# Patient Record
Sex: Male | Born: 1935
Health system: Southern US, Community
[De-identification: ages and names within clinical notes are randomized; demographics above are authoritative.]

## PROBLEM LIST (undated history)

## (undated) DIAGNOSIS — I2510 Atherosclerotic heart disease of native coronary artery without angina pectoris: Secondary | ICD-10-CM

## (undated) DIAGNOSIS — M1990 Unspecified osteoarthritis, unspecified site: Secondary | ICD-10-CM

## (undated) DIAGNOSIS — G20 Parkinson's disease: Secondary | ICD-10-CM

## (undated) DIAGNOSIS — I4891 Unspecified atrial fibrillation: Secondary | ICD-10-CM

## (undated) DIAGNOSIS — R251 Tremor, unspecified: Secondary | ICD-10-CM

## (undated) DIAGNOSIS — I052 Rheumatic mitral stenosis with insufficiency: Secondary | ICD-10-CM

## (undated) DIAGNOSIS — C61 Malignant neoplasm of prostate: Secondary | ICD-10-CM

## (undated) DIAGNOSIS — I10 Essential (primary) hypertension: Secondary | ICD-10-CM

## (undated) DIAGNOSIS — I350 Nonrheumatic aortic (valve) stenosis: Secondary | ICD-10-CM

## (undated) DIAGNOSIS — Z87442 Personal history of urinary calculi: Secondary | ICD-10-CM

## (undated) DIAGNOSIS — G20A1 Parkinson's disease without dyskinesia, without mention of fluctuations: Secondary | ICD-10-CM

## (undated) DIAGNOSIS — C4360 Malignant melanoma of unspecified upper limb, including shoulder: Secondary | ICD-10-CM

## (undated) DIAGNOSIS — K922 Gastrointestinal hemorrhage, unspecified: Secondary | ICD-10-CM

## (undated) DIAGNOSIS — E7800 Pure hypercholesterolemia, unspecified: Secondary | ICD-10-CM

## (undated) HISTORY — PX: CATARACT EXTRACTION W/ INTRAOCULAR LENS  IMPLANT, BILATERAL: SHX1307

## (undated) HISTORY — PX: MELANOMA EXCISION: SHX5266

## (undated) HISTORY — PX: CARDIAC CATHETERIZATION: SHX172

---

## 1989-02-28 HISTORY — PX: INGUINAL HERNIA REPAIR: SUR1180

## 1991-03-01 HISTORY — PX: LAPAROSCOPIC CHOLECYSTECTOMY: SUR755

## 2006-03-01 ENCOUNTER — Emergency Department (HOSPITAL_COMMUNITY): Admission: EM | Admit: 2006-03-01 | Discharge: 2006-03-01 | Payer: Self-pay | Admitting: Family Medicine

## 2009-02-28 DIAGNOSIS — C61 Malignant neoplasm of prostate: Secondary | ICD-10-CM

## 2009-02-28 HISTORY — DX: Malignant neoplasm of prostate: C61

## 2009-03-11 ENCOUNTER — Ambulatory Visit
Admission: RE | Admit: 2009-03-11 | Discharge: 2009-06-09 | Payer: Self-pay | Source: Home / Self Care | Admitting: Radiation Oncology

## 2009-05-25 ENCOUNTER — Encounter: Admission: RE | Admit: 2009-05-25 | Discharge: 2009-05-25 | Payer: Self-pay | Admitting: Urology

## 2009-06-25 ENCOUNTER — Ambulatory Visit (HOSPITAL_BASED_OUTPATIENT_CLINIC_OR_DEPARTMENT_OTHER): Admission: RE | Admit: 2009-06-25 | Discharge: 2009-06-25 | Payer: Self-pay | Admitting: Urology

## 2009-06-25 HISTORY — PX: INSERTION PROSTATE RADIATION SEED: SUR718

## 2009-07-14 ENCOUNTER — Ambulatory Visit: Admission: RE | Admit: 2009-07-14 | Discharge: 2009-08-12 | Payer: Self-pay | Admitting: Radiation Oncology

## 2009-08-05 ENCOUNTER — Encounter (INDEPENDENT_AMBULATORY_CARE_PROVIDER_SITE_OTHER): Payer: Self-pay | Admitting: Urology

## 2009-08-05 ENCOUNTER — Ambulatory Visit: Payer: Self-pay | Admitting: Vascular Surgery

## 2009-08-05 ENCOUNTER — Ambulatory Visit (HOSPITAL_COMMUNITY): Admission: RE | Admit: 2009-08-05 | Discharge: 2009-08-05 | Payer: Self-pay | Admitting: Urology

## 2010-05-18 LAB — COMPREHENSIVE METABOLIC PANEL
ALT: 23 U/L (ref 0–53)
AST: 28 U/L (ref 0–37)
Albumin: 4.3 g/dL (ref 3.5–5.2)
Alkaline Phosphatase: 77 U/L (ref 39–117)
BUN: 16 mg/dL (ref 6–23)
CO2: 29 mEq/L (ref 19–32)
Calcium: 9.7 mg/dL (ref 8.4–10.5)
Chloride: 103 mEq/L (ref 96–112)
Creatinine, Ser: 1.15 mg/dL (ref 0.4–1.5)
GFR calc Af Amer: >60 mL/min (ref >60)
GFR calc non Af Amer: >60 mL/min (ref >60)
Glucose, Bld: 101 mg/dL — ABNORMAL HIGH (ref 70–99)
Potassium: 4.2 mEq/L (ref 3.5–5.1)
Sodium: 140 mEq/L (ref 135–145)
Total Bilirubin: 3 mg/dL — ABNORMAL HIGH (ref 0.3–1.2)
Total Protein: 6.7 g/dL (ref 6.0–8.3)

## 2010-05-18 LAB — CBC
HCT: 41.2 % (ref 39.0–52.0)
Hemoglobin: 13.9 g/dL (ref 13.0–17.0)
MCHC: 33.7 g/dL (ref 30.0–36.0)
MCV: 91.1 fL (ref 78.0–100.0)
Platelets: 176 10*3/uL (ref 150–400)
RBC: 4.52 MIL/uL (ref 4.22–5.81)
RDW: 13.2 % (ref 11.5–15.5)
WBC: 5.5 10*3/uL (ref 4.0–10.5)

## 2010-05-18 LAB — PROTIME-INR
INR: 1.01 (ref 0.00–1.49)
Prothrombin Time: 13.2 seconds (ref 11.6–15.2)

## 2010-05-18 LAB — APTT: aPTT: 34 seconds (ref 24–37)

## 2015-03-05 DIAGNOSIS — E785 Hyperlipidemia, unspecified: Secondary | ICD-10-CM | POA: Diagnosis not present

## 2015-03-05 DIAGNOSIS — I10 Essential (primary) hypertension: Secondary | ICD-10-CM | POA: Diagnosis not present

## 2015-03-05 DIAGNOSIS — Z79899 Other long term (current) drug therapy: Secondary | ICD-10-CM | POA: Diagnosis not present

## 2015-03-23 DIAGNOSIS — L570 Actinic keratosis: Secondary | ICD-10-CM | POA: Diagnosis not present

## 2015-03-23 DIAGNOSIS — Z85820 Personal history of malignant melanoma of skin: Secondary | ICD-10-CM | POA: Diagnosis not present

## 2015-03-23 DIAGNOSIS — L821 Other seborrheic keratosis: Secondary | ICD-10-CM | POA: Diagnosis not present

## 2015-03-23 DIAGNOSIS — L853 Xerosis cutis: Secondary | ICD-10-CM | POA: Diagnosis not present

## 2015-03-23 DIAGNOSIS — L812 Freckles: Secondary | ICD-10-CM | POA: Diagnosis not present

## 2015-03-23 DIAGNOSIS — D485 Neoplasm of uncertain behavior of skin: Secondary | ICD-10-CM | POA: Diagnosis not present

## 2015-03-23 DIAGNOSIS — C44719 Basal cell carcinoma of skin of left lower limb, including hip: Secondary | ICD-10-CM | POA: Diagnosis not present

## 2015-04-29 DIAGNOSIS — M2242 Chondromalacia patellae, left knee: Secondary | ICD-10-CM | POA: Diagnosis not present

## 2015-04-29 DIAGNOSIS — M25561 Pain in right knee: Secondary | ICD-10-CM | POA: Diagnosis not present

## 2015-04-29 DIAGNOSIS — M7121 Synovial cyst of popliteal space [Baker], right knee: Secondary | ICD-10-CM | POA: Diagnosis not present

## 2015-04-29 DIAGNOSIS — M25461 Effusion, right knee: Secondary | ICD-10-CM | POA: Diagnosis not present

## 2015-04-29 DIAGNOSIS — M25562 Pain in left knee: Secondary | ICD-10-CM | POA: Diagnosis not present

## 2015-04-29 DIAGNOSIS — M2241 Chondromalacia patellae, right knee: Secondary | ICD-10-CM | POA: Diagnosis not present

## 2015-05-13 DIAGNOSIS — M7122 Synovial cyst of popliteal space [Baker], left knee: Secondary | ICD-10-CM | POA: Diagnosis not present

## 2015-05-13 DIAGNOSIS — M1712 Unilateral primary osteoarthritis, left knee: Secondary | ICD-10-CM | POA: Diagnosis not present

## 2015-05-13 DIAGNOSIS — M170 Bilateral primary osteoarthritis of knee: Secondary | ICD-10-CM | POA: Diagnosis not present

## 2015-05-13 DIAGNOSIS — M2242 Chondromalacia patellae, left knee: Secondary | ICD-10-CM | POA: Diagnosis not present

## 2015-05-13 DIAGNOSIS — M25562 Pain in left knee: Secondary | ICD-10-CM | POA: Diagnosis not present

## 2015-05-13 DIAGNOSIS — M25561 Pain in right knee: Secondary | ICD-10-CM | POA: Diagnosis not present

## 2015-06-10 DIAGNOSIS — M170 Bilateral primary osteoarthritis of knee: Secondary | ICD-10-CM | POA: Diagnosis not present

## 2015-06-10 DIAGNOSIS — M2242 Chondromalacia patellae, left knee: Secondary | ICD-10-CM | POA: Diagnosis not present

## 2015-06-10 DIAGNOSIS — M2241 Chondromalacia patellae, right knee: Secondary | ICD-10-CM | POA: Diagnosis not present

## 2015-06-10 DIAGNOSIS — M25561 Pain in right knee: Secondary | ICD-10-CM | POA: Diagnosis not present

## 2015-06-10 DIAGNOSIS — M25562 Pain in left knee: Secondary | ICD-10-CM | POA: Diagnosis not present

## 2015-06-22 DIAGNOSIS — M1712 Unilateral primary osteoarthritis, left knee: Secondary | ICD-10-CM | POA: Diagnosis not present

## 2015-06-22 DIAGNOSIS — M7121 Synovial cyst of popliteal space [Baker], right knee: Secondary | ICD-10-CM | POA: Diagnosis not present

## 2015-06-22 DIAGNOSIS — M170 Bilateral primary osteoarthritis of knee: Secondary | ICD-10-CM | POA: Diagnosis not present

## 2015-06-22 DIAGNOSIS — M2241 Chondromalacia patellae, right knee: Secondary | ICD-10-CM | POA: Diagnosis not present

## 2015-06-22 DIAGNOSIS — M2242 Chondromalacia patellae, left knee: Secondary | ICD-10-CM | POA: Diagnosis not present

## 2015-06-22 DIAGNOSIS — M1711 Unilateral primary osteoarthritis, right knee: Secondary | ICD-10-CM | POA: Diagnosis not present

## 2015-06-29 DIAGNOSIS — M7122 Synovial cyst of popliteal space [Baker], left knee: Secondary | ICD-10-CM | POA: Diagnosis not present

## 2015-06-29 DIAGNOSIS — M170 Bilateral primary osteoarthritis of knee: Secondary | ICD-10-CM | POA: Diagnosis not present

## 2015-06-29 DIAGNOSIS — M2241 Chondromalacia patellae, right knee: Secondary | ICD-10-CM | POA: Diagnosis not present

## 2015-06-29 DIAGNOSIS — M2242 Chondromalacia patellae, left knee: Secondary | ICD-10-CM | POA: Diagnosis not present

## 2015-07-06 DIAGNOSIS — M1711 Unilateral primary osteoarthritis, right knee: Secondary | ICD-10-CM | POA: Diagnosis not present

## 2015-07-06 DIAGNOSIS — M170 Bilateral primary osteoarthritis of knee: Secondary | ICD-10-CM | POA: Diagnosis not present

## 2015-07-06 DIAGNOSIS — M1712 Unilateral primary osteoarthritis, left knee: Secondary | ICD-10-CM | POA: Diagnosis not present

## 2015-08-17 DIAGNOSIS — M1712 Unilateral primary osteoarthritis, left knee: Secondary | ICD-10-CM | POA: Diagnosis not present

## 2015-08-17 DIAGNOSIS — M1711 Unilateral primary osteoarthritis, right knee: Secondary | ICD-10-CM | POA: Diagnosis not present

## 2015-08-17 DIAGNOSIS — M25561 Pain in right knee: Secondary | ICD-10-CM | POA: Diagnosis not present

## 2015-08-17 DIAGNOSIS — M25461 Effusion, right knee: Secondary | ICD-10-CM | POA: Diagnosis not present

## 2015-08-26 DIAGNOSIS — M7122 Synovial cyst of popliteal space [Baker], left knee: Secondary | ICD-10-CM | POA: Diagnosis not present

## 2015-08-26 DIAGNOSIS — M1711 Unilateral primary osteoarthritis, right knee: Secondary | ICD-10-CM | POA: Diagnosis not present

## 2015-09-03 DIAGNOSIS — M25461 Effusion, right knee: Secondary | ICD-10-CM | POA: Diagnosis not present

## 2015-09-03 DIAGNOSIS — M1711 Unilateral primary osteoarthritis, right knee: Secondary | ICD-10-CM | POA: Diagnosis not present

## 2015-09-03 DIAGNOSIS — M2241 Chondromalacia patellae, right knee: Secondary | ICD-10-CM | POA: Diagnosis not present

## 2015-09-03 DIAGNOSIS — M25561 Pain in right knee: Secondary | ICD-10-CM | POA: Diagnosis not present

## 2015-09-15 DIAGNOSIS — Z79899 Other long term (current) drug therapy: Secondary | ICD-10-CM | POA: Diagnosis not present

## 2015-09-15 DIAGNOSIS — Z0000 Encounter for general adult medical examination without abnormal findings: Secondary | ICD-10-CM | POA: Diagnosis not present

## 2015-09-15 DIAGNOSIS — E785 Hyperlipidemia, unspecified: Secondary | ICD-10-CM | POA: Diagnosis not present

## 2015-09-15 DIAGNOSIS — Z125 Encounter for screening for malignant neoplasm of prostate: Secondary | ICD-10-CM | POA: Diagnosis not present

## 2015-09-15 DIAGNOSIS — N189 Chronic kidney disease, unspecified: Secondary | ICD-10-CM | POA: Diagnosis not present

## 2015-09-15 DIAGNOSIS — I10 Essential (primary) hypertension: Secondary | ICD-10-CM | POA: Diagnosis not present

## 2015-09-24 DIAGNOSIS — D485 Neoplasm of uncertain behavior of skin: Secondary | ICD-10-CM | POA: Diagnosis not present

## 2015-09-24 DIAGNOSIS — L812 Freckles: Secondary | ICD-10-CM | POA: Diagnosis not present

## 2015-09-24 DIAGNOSIS — L570 Actinic keratosis: Secondary | ICD-10-CM | POA: Diagnosis not present

## 2015-09-24 DIAGNOSIS — D0471 Carcinoma in situ of skin of right lower limb, including hip: Secondary | ICD-10-CM | POA: Diagnosis not present

## 2015-09-24 DIAGNOSIS — L281 Prurigo nodularis: Secondary | ICD-10-CM | POA: Diagnosis not present

## 2015-09-24 DIAGNOSIS — Z85828 Personal history of other malignant neoplasm of skin: Secondary | ICD-10-CM | POA: Diagnosis not present

## 2015-09-24 DIAGNOSIS — L821 Other seborrheic keratosis: Secondary | ICD-10-CM | POA: Diagnosis not present

## 2015-09-24 DIAGNOSIS — Z85820 Personal history of malignant melanoma of skin: Secondary | ICD-10-CM | POA: Diagnosis not present

## 2015-12-15 DIAGNOSIS — H2521 Age-related cataract, morgagnian type, right eye: Secondary | ICD-10-CM | POA: Diagnosis not present

## 2015-12-15 DIAGNOSIS — H5202 Hypermetropia, left eye: Secondary | ICD-10-CM | POA: Diagnosis not present

## 2015-12-15 DIAGNOSIS — H35362 Drusen (degenerative) of macula, left eye: Secondary | ICD-10-CM | POA: Diagnosis not present

## 2015-12-15 DIAGNOSIS — Z961 Presence of intraocular lens: Secondary | ICD-10-CM | POA: Diagnosis not present

## 2015-12-16 DIAGNOSIS — C61 Malignant neoplasm of prostate: Secondary | ICD-10-CM | POA: Diagnosis not present

## 2015-12-16 DIAGNOSIS — R972 Elevated prostate specific antigen [PSA]: Secondary | ICD-10-CM | POA: Diagnosis not present

## 2015-12-16 DIAGNOSIS — N3040 Irradiation cystitis without hematuria: Secondary | ICD-10-CM | POA: Diagnosis not present

## 2015-12-21 DIAGNOSIS — I10 Essential (primary) hypertension: Secondary | ICD-10-CM | POA: Diagnosis not present

## 2015-12-25 DIAGNOSIS — Z23 Encounter for immunization: Secondary | ICD-10-CM | POA: Diagnosis not present

## 2016-02-10 DIAGNOSIS — H353131 Nonexudative age-related macular degeneration, bilateral, early dry stage: Secondary | ICD-10-CM | POA: Diagnosis not present

## 2016-02-10 DIAGNOSIS — H2511 Age-related nuclear cataract, right eye: Secondary | ICD-10-CM | POA: Diagnosis not present

## 2016-03-08 DIAGNOSIS — H2511 Age-related nuclear cataract, right eye: Secondary | ICD-10-CM | POA: Diagnosis not present

## 2016-03-25 DIAGNOSIS — D485 Neoplasm of uncertain behavior of skin: Secondary | ICD-10-CM | POA: Diagnosis not present

## 2016-03-25 DIAGNOSIS — L812 Freckles: Secondary | ICD-10-CM | POA: Diagnosis not present

## 2016-03-25 DIAGNOSIS — Z85820 Personal history of malignant melanoma of skin: Secondary | ICD-10-CM | POA: Diagnosis not present

## 2016-03-25 DIAGNOSIS — Z85828 Personal history of other malignant neoplasm of skin: Secondary | ICD-10-CM | POA: Diagnosis not present

## 2016-03-25 DIAGNOSIS — L570 Actinic keratosis: Secondary | ICD-10-CM | POA: Diagnosis not present

## 2016-03-25 DIAGNOSIS — L821 Other seborrheic keratosis: Secondary | ICD-10-CM | POA: Diagnosis not present

## 2016-03-25 DIAGNOSIS — L218 Other seborrheic dermatitis: Secondary | ICD-10-CM | POA: Diagnosis not present

## 2016-04-20 DIAGNOSIS — H26492 Other secondary cataract, left eye: Secondary | ICD-10-CM | POA: Diagnosis not present

## 2016-04-20 DIAGNOSIS — Z9842 Cataract extraction status, left eye: Secondary | ICD-10-CM | POA: Diagnosis not present

## 2016-04-20 DIAGNOSIS — Z961 Presence of intraocular lens: Secondary | ICD-10-CM | POA: Diagnosis not present

## 2016-04-25 DIAGNOSIS — I10 Essential (primary) hypertension: Secondary | ICD-10-CM | POA: Diagnosis not present

## 2016-05-05 DIAGNOSIS — R0789 Other chest pain: Secondary | ICD-10-CM | POA: Diagnosis not present

## 2016-05-05 DIAGNOSIS — R0602 Shortness of breath: Secondary | ICD-10-CM | POA: Diagnosis not present

## 2016-05-05 DIAGNOSIS — J159 Unspecified bacterial pneumonia: Secondary | ICD-10-CM | POA: Diagnosis not present

## 2016-05-14 DIAGNOSIS — J159 Unspecified bacterial pneumonia: Secondary | ICD-10-CM | POA: Diagnosis not present

## 2016-05-14 DIAGNOSIS — R0602 Shortness of breath: Secondary | ICD-10-CM | POA: Diagnosis not present

## 2016-05-20 DIAGNOSIS — J159 Unspecified bacterial pneumonia: Secondary | ICD-10-CM | POA: Diagnosis not present

## 2016-05-29 HISTORY — PX: EYE SURGERY: SHX253

## 2016-06-10 DIAGNOSIS — R05 Cough: Secondary | ICD-10-CM | POA: Diagnosis not present

## 2016-06-10 DIAGNOSIS — J64 Unspecified pneumoconiosis: Secondary | ICD-10-CM | POA: Diagnosis not present

## 2016-07-01 DIAGNOSIS — C61 Malignant neoplasm of prostate: Secondary | ICD-10-CM | POA: Diagnosis not present

## 2016-07-01 DIAGNOSIS — R310 Gross hematuria: Secondary | ICD-10-CM | POA: Diagnosis not present

## 2016-07-08 DIAGNOSIS — R3129 Other microscopic hematuria: Secondary | ICD-10-CM | POA: Diagnosis not present

## 2016-07-08 DIAGNOSIS — R310 Gross hematuria: Secondary | ICD-10-CM | POA: Diagnosis not present

## 2016-07-18 DIAGNOSIS — Z961 Presence of intraocular lens: Secondary | ICD-10-CM | POA: Diagnosis not present

## 2016-07-18 DIAGNOSIS — H26491 Other secondary cataract, right eye: Secondary | ICD-10-CM | POA: Diagnosis not present

## 2016-07-22 DIAGNOSIS — C61 Malignant neoplasm of prostate: Secondary | ICD-10-CM | POA: Diagnosis not present

## 2016-07-22 DIAGNOSIS — R310 Gross hematuria: Secondary | ICD-10-CM | POA: Diagnosis not present

## 2016-07-26 DIAGNOSIS — H353121 Nonexudative age-related macular degeneration, left eye, early dry stage: Secondary | ICD-10-CM | POA: Diagnosis not present

## 2016-07-30 DIAGNOSIS — J22 Unspecified acute lower respiratory infection: Secondary | ICD-10-CM | POA: Diagnosis not present

## 2016-07-30 DIAGNOSIS — R062 Wheezing: Secondary | ICD-10-CM | POA: Diagnosis not present

## 2016-08-04 DIAGNOSIS — G20 Parkinson's disease: Secondary | ICD-10-CM | POA: Diagnosis not present

## 2016-08-04 DIAGNOSIS — E7800 Pure hypercholesterolemia, unspecified: Secondary | ICD-10-CM | POA: Diagnosis not present

## 2016-08-04 DIAGNOSIS — I2510 Atherosclerotic heart disease of native coronary artery without angina pectoris: Secondary | ICD-10-CM | POA: Diagnosis not present

## 2016-08-04 DIAGNOSIS — R0609 Other forms of dyspnea: Secondary | ICD-10-CM | POA: Diagnosis not present

## 2016-08-05 DIAGNOSIS — I2510 Atherosclerotic heart disease of native coronary artery without angina pectoris: Secondary | ICD-10-CM | POA: Diagnosis not present

## 2016-08-07 DIAGNOSIS — I2510 Atherosclerotic heart disease of native coronary artery without angina pectoris: Secondary | ICD-10-CM | POA: Diagnosis present

## 2016-08-07 DIAGNOSIS — R0600 Dyspnea, unspecified: Secondary | ICD-10-CM | POA: Diagnosis present

## 2016-08-07 DIAGNOSIS — R0609 Other forms of dyspnea: Secondary | ICD-10-CM

## 2016-08-07 NOTE — H&P (Signed)
OFFICE VISIT NOTES COPIED TO EPIC FOR DOCUMENTATION  . History of Present Illness [name] Page MD; 08/05/2016 5:58 PM) Patient words: Last OV 12/21/2015; FU for CT results.  The patient is a 81 year old male who presents for shortness of breath.  Additional reasons for visit:  Follow-up for Aortic stenosis is described as the following: He has h/o of mild aortic stenosisremote h/o A. Fib without appropriate documentation. He was being followed by cardiologist in [place] and he has not established with me. The last echocardiogram was in 2012 revealing moderate LVH, mild mitral and aortic regurgitation and moderate aortic sclerosis with aortic root being in the upper limits of normal.  Patient has history of prostate cancer status post radiation seed implantation in 2011. He is now being followed by Dr. Carolan Clines. During evaluation of his prostate cancer, CT scan of the abdomen had revealed significant atherosclerotic burden of the aorta and also three-vessel coronary artery calcification including left main, severe calcification of the aortic valve. Hence he was re-referred back to me for evaluation of an urgent basis as patient had developed severe symptoms or dyspnea or the past one month. He has been treated for "pneumonia" with antibiotics and with no improvement in symptoms. He has had some cough but states that mostly during minimal chores, including walking within the house he gets so fatigued and tired and dyspneic that he has to sit down. Both patient and his wife have been extremely concerned about his new onset of symptoms and wanted to be seen on a urgent basis. No leg edema, no PND or orthopnea. No chest pain.   Problem List/Past Medical (April Harrington; 08/20/2016 3:06 PM) History of prostate cancer (Z85.46) [05/2009]: OV Note-Dr Carolan Clines 07/22/2016 History of prostate cancer status post seed implantation in April 2011, now has radiation cystitis and  prostatitis, mild. Microscopic eventually a mild, no further treatment surgery. Severe aortic calcification by CT chest. Venous insufficiency (I87.2)  History of melanoma (Z85.820) [2010]: Left Shoulder Essential hypertension, benign (I10)  Echocardiogram 62/83/6629 Normal LV systolic function, EF 47-65%. Moderate asymmetric LVH. Severe left atrial enlargement. Mild mitral and aortic regurgitation. Moderate aortic sclerosis. Aortic root upper limits of normal. Hypercholesteremia (E78.00)  CT of the abdomen and pelvis 07/08/2016: Elevated right hemidiaphragm. Atherosclerotic calcification in all 3 coronary vessels including left main, severe calcification of the aortic valve. Aortic atherosclerosis is evident.  Allergies (April Harrington; August 20, 2016 3:06 PM) No Known Drug Allergies [10/27/2014]:  Family History (April Harrington; 08/20/2016 3:06 PM) Mother  Deceased. at age 29; Pt had several Strokes, MI's in her early 62's Father  Deceased. at age 58 from an MI (was his 61st) Sister 1  Deceased. from a Cerebral Hemorrhage; Older Brother 1  4 Yrs Younger; Unknown History  Social History (April Harrington; 08-20-2016 3:06 PM) Marital status  Divorced. Living Situation  Lives with Partner Current tobacco use  Never smoker. Alcohol Use  Moderate alcohol use. 2 drinks/day Number of Children  1.  Past Surgical History (April Harrington; 20-Aug-2016 3:36 PM) Hernia Repair [1991]: Cholecystectomy [1993]: Prostate Surgery [06/25/2009]: Cataract Extraction-Left [2014]: Cataract right 02/2016, Laser treatment 05/2016  Medication History (April Harrington; 08-20-16 3:40 PM) PreserVision AREDS 2 (1 Oral two times daily) Active. Vitamin E (400UNIT Tablet, 1 Oral Daily) Active. Vitamin D3 (1000UNIT Tablet, 1 Oral daily) Active. Tamsulosin HCl (0.4MG Capsule, 1 Oral at bedtime) Active. Crestor (10MG Tablet, 1 Oral at bedtime) Active. (PT CANNOT TAKE GENERIC!) Aspirin (81MG Tablet, 1  Oral daily) Active. Losartan  Potassium ('50MG'$  Tablet, 1 Oral daily) Active. Triamterene-HCTZ (37.5-'25MG'$  Tablet, 1 Oral Daily) Active. Propranolol HCl ('40MG'$  Tablet, 1 Oral two times daily) Active. Medications Reconciled (verbally with patient/ list present)  Diagnostic Studies History Anderson Malta Sergeant; 08/04/2016 9:16 AM) CT Scan of Abdomen [07/08/2016]: Elevated right hemidiaphragm. Atherosclerotic calcification in all 3 coronary vessels including left main, severe calcification of the aortic valve. Aortic atherosclerosis is evident.    Review of Systems [name] Page MD; 08/05/2016 5:59 PM) General Present- Feeling well. Not Present- Fatigue, Fever and Night Sweats. Skin Not Present- Itching and Rash. HEENT Not Present- Headache. Respiratory Present- Cough and Difficulty Breathing on Exertion. Not Present- Bloody sputum, Wakes up from Sleep Wheezing or Short of Breath and Wheezing. Cardiovascular Not Present- Claudications, Edema, Fainting and Orthopnea. Gastrointestinal Not Present- Abdominal Pain, Constipation, Diarrhea, Nausea and Vomiting. Musculoskeletal Not Present- Joint Swelling. Neurological Present- Tremor. Not Present- Headaches. Hematology Not Present- Blood Clots, Easy Bruising and Nose Bleed.  Vitals (April Harrington; 08/04/2016 3:53 PM) 08/04/2016 3:30 PM Weight: 205.31 lb Height: 71in Body Surface Area: 2.13 m Body Mass Index: 28.63 kg/m  Pulse: 67 (Regular)  P.OX: 98% (Room air) BP: 172/82 (Sitting, Left Arm, Standard)       Physical Exam [name] Page MD; 08/05/2016 6:02 PM) General Mental Status-Alert. General Appearance-Cooperative, Appears stated age, Not in acute distress. Orientation-Oriented X3. Build & Nutrition-Well built.  Head and Neck Thyroid Gland Characteristics - no palpable nodules, no palpable enlargement.  Chest and Lung Exam Palpation Tender - No chest wall tenderness. Auscultation Breath sounds -  Clear.  Cardiovascular Inspection Jugular vein - Right - No Distention. Auscultation Heart Sounds - S1 WNL, S2 WNL and No gallop present. Murmurs & Other Heart Sounds - Murmur - No murmur.  Abdomen Palpation/Percussion Normal exam - Non Tender and No hepatosplenomegaly. Auscultation Normal exam - Bowel sounds normal.  Peripheral Vascular Lower Extremity Inspection - Left - Varicose veins, No Pigmentation. Right - No Pigmentation, No Varicose veins. Palpation - Edema - Left - No edema. Right - No edema. Femoral pulse - Left - Normal. Right - Normal. Popliteal pulse - Left - Normal. Right - Normal. Dorsalis pedis pulse - Left - Normal. Right - Normal. Posterior tibial pulse - Left - Normal. Right - Normal. Carotid arteries - Left-No Carotid bruit. Carotid arteries - Right-No Carotid bruit. Abdomen-No prominent abdominal aortic pulsation, No epigastric bruit.  Neurologic Motor Involuntary Movements - Resting tremor - Left Upper Extremity(lips) and Right Upper Extremity.  Musculoskeletal - Did not examine.    Assessment & Plan [name] Page MD; 08/05/2016 6:07 PM) Dyspnea on exertion (R06.09) Impression: EKG 08/04/2016: Normal sinus rhythm at the rate of 63 bpm, left atrial abnormality, no evidence of ischemia, normal QT interval. Normal EKG. Current Plans Complete electrocardiogram (93000) Coronary artery calcification seen on CAT scan (I25.10) Story: CT of the abdomen and pelvis 07/08/2016: Elevated right hemidiaphragm. Atherosclerotic calcification in all 3 coronary vessels including left main, severe calcification of the aortic valve. Aortic atherosclerosis is evident. Current Plans METABOLIC PANEL, BASIC (44315) CBC & PLATELETS (AUTO) (85027) PT (PROTHROMBIN TIME) (40086) Hypercholesteremia (E78.00) Parkinson's disease (tremor, stiffness, slow motion, unstable posture) (G20) Laboratory examination (Z01.89) Essential hypertension, benign (I10) Story:  Echocardiogram 76/19/5093 Normal LV systolic function, EF 26-71%. Moderate asymmetric LVH. Severe left atrial enlargement. Mild mitral and aortic regurgitation. Moderate aortic sclerosis. Aortic root upper limits of normal. Impression: EKG 12/21/2015: Normal sinus rhythm at rate of 66 bpm, normal axis. No evidence of ischemia, normal EKG. EKG  10/27/2014: Sinus bradycardia at the rate of 58 bpm, leftward axis, otherwise normal EKG. No evidence of ischemia. Current Plans Mechanism of underlying disease process and action of medications discussed with the patient. I discussed primary/secondary prevention and also dietary counceling was done. Patient with class III symptoms of dyspnea on exertion very concerning for anginal equivalent symptoms. He is presently on appropriate and aggressive medical therapy however will need to have coronary angiography and also right heart catheterization to evaluate his symptoms. He has severe coronary calcification by CT scan and multivessel disease and left main disease also need to be excluded given rapid onset of worsening dyspnea. Schedule for cardiac catheterization, and possible angioplasty. We discussed regarding risks, benefits, alternatives to this including stress testing, CTA and continued medical therapy. Patient wants to proceed. Understands <1-2% risk of death, stroke, MI, urgent CABG, bleeding, infection, renal failure but not limited to these.  Patient also has Parkinsonian tremors and stiffness, he will certainly need to be evaluated in future for the same after his cardiac evaluation is complete. Blood pressure is well controlled, per patient lipids are also well controlled. He does not have a PCP, I will refer him to be established with [facility].  Addendum Note(Jagadeesh Carlynn Herald MD; 08/06/2016 8:06 AM) Labs 06/80 08/17/16: Serum glucose 101 mg, BUN 26, creatinine 1.39 eGFR 48 mL, potassium 4.8. HB 14.1/HCT 43.4, platelets 204. Pro time  normal.  Signed by [name] Page, MD (08/05/2016 6:07 PM)

## 2016-08-09 ENCOUNTER — Other Ambulatory Visit: Payer: Self-pay

## 2016-08-09 ENCOUNTER — Encounter (HOSPITAL_COMMUNITY)
Admission: RE | Disposition: A | Discharge status: Home / Self Care | Payer: Self-pay | Source: Ambulatory Visit | Attending: Cardiology

## 2016-08-09 ENCOUNTER — Encounter (HOSPITAL_COMMUNITY): Payer: Self-pay | Admitting: General Practice

## 2016-08-09 ENCOUNTER — Ambulatory Visit (HOSPITAL_COMMUNITY)
Admission: RE | Admit: 2016-08-09 | Discharge: 2016-08-10 | Disposition: A | Discharge status: Home / Self Care | Payer: Medicare Other | Source: Ambulatory Visit | Attending: Cardiology | Admitting: Cardiology

## 2016-08-09 DIAGNOSIS — I272 Pulmonary hypertension, unspecified: Secondary | ICD-10-CM | POA: Diagnosis not present

## 2016-08-09 DIAGNOSIS — G20 Parkinson's disease: Secondary | ICD-10-CM | POA: Insufficient documentation

## 2016-08-09 DIAGNOSIS — R0609 Other forms of dyspnea: Secondary | ICD-10-CM | POA: Diagnosis present

## 2016-08-09 DIAGNOSIS — Z8546 Personal history of malignant neoplasm of prostate: Secondary | ICD-10-CM | POA: Diagnosis not present

## 2016-08-09 DIAGNOSIS — I110 Hypertensive heart disease with heart failure: Secondary | ICD-10-CM | POA: Diagnosis not present

## 2016-08-09 DIAGNOSIS — I2510 Atherosclerotic heart disease of native coronary artery without angina pectoris: Secondary | ICD-10-CM | POA: Diagnosis present

## 2016-08-09 DIAGNOSIS — Z9861 Coronary angioplasty status: Secondary | ICD-10-CM

## 2016-08-09 DIAGNOSIS — Z79899 Other long term (current) drug therapy: Secondary | ICD-10-CM | POA: Insufficient documentation

## 2016-08-09 DIAGNOSIS — Z85820 Personal history of malignant melanoma of skin: Secondary | ICD-10-CM | POA: Insufficient documentation

## 2016-08-09 DIAGNOSIS — Z8249 Family history of ischemic heart disease and other diseases of the circulatory system: Secondary | ICD-10-CM | POA: Insufficient documentation

## 2016-08-09 DIAGNOSIS — Z923 Personal history of irradiation: Secondary | ICD-10-CM | POA: Diagnosis not present

## 2016-08-09 DIAGNOSIS — Z823 Family history of stroke: Secondary | ICD-10-CM | POA: Diagnosis not present

## 2016-08-09 DIAGNOSIS — Z7982 Long term (current) use of aspirin: Secondary | ICD-10-CM | POA: Diagnosis not present

## 2016-08-09 DIAGNOSIS — I509 Heart failure, unspecified: Secondary | ICD-10-CM | POA: Diagnosis not present

## 2016-08-09 DIAGNOSIS — I700 Atherosclerosis of aorta: Secondary | ICD-10-CM | POA: Insufficient documentation

## 2016-08-09 DIAGNOSIS — I080 Rheumatic disorders of both mitral and aortic valves: Secondary | ICD-10-CM | POA: Diagnosis not present

## 2016-08-09 DIAGNOSIS — I25119 Atherosclerotic heart disease of native coronary artery with unspecified angina pectoris: Secondary | ICD-10-CM | POA: Diagnosis not present

## 2016-08-09 DIAGNOSIS — R0600 Dyspnea, unspecified: Secondary | ICD-10-CM | POA: Diagnosis present

## 2016-08-09 DIAGNOSIS — Z955 Presence of coronary angioplasty implant and graft: Secondary | ICD-10-CM

## 2016-08-09 DIAGNOSIS — E7800 Pure hypercholesterolemia, unspecified: Secondary | ICD-10-CM | POA: Insufficient documentation

## 2016-08-09 DIAGNOSIS — I25118 Atherosclerotic heart disease of native coronary artery with other forms of angina pectoris: Secondary | ICD-10-CM | POA: Diagnosis not present

## 2016-08-09 DIAGNOSIS — Z7902 Long term (current) use of antithrombotics/antiplatelets: Secondary | ICD-10-CM | POA: Diagnosis not present

## 2016-08-09 HISTORY — PX: INTRAVASCULAR PRESSURE WIRE/FFR STUDY: CATH118243

## 2016-08-09 HISTORY — DX: Personal history of urinary calculi: Z87.442

## 2016-08-09 HISTORY — DX: Unspecified osteoarthritis, unspecified site: M19.90

## 2016-08-09 HISTORY — DX: Malignant melanoma of unspecified upper limb, including shoulder: C43.60

## 2016-08-09 HISTORY — DX: Malignant neoplasm of prostate: C61

## 2016-08-09 HISTORY — PX: RIGHT/LEFT HEART CATH AND CORONARY ANGIOGRAPHY: CATH118266

## 2016-08-09 HISTORY — DX: Nonrheumatic aortic (valve) stenosis: I35.0

## 2016-08-09 HISTORY — PX: CORONARY ANGIOPLASTY WITH STENT PLACEMENT: SHX49

## 2016-08-09 HISTORY — DX: Essential (primary) hypertension: I10

## 2016-08-09 HISTORY — DX: Rheumatic mitral stenosis with insufficiency: I05.2

## 2016-08-09 HISTORY — DX: Tremor, unspecified: R25.1

## 2016-08-09 HISTORY — DX: Pure hypercholesterolemia, unspecified: E78.00

## 2016-08-09 HISTORY — PX: CORONARY ATHERECTOMY: CATH118238

## 2016-08-09 HISTORY — PX: CORONARY STENT INTERVENTION: CATH118234

## 2016-08-09 HISTORY — DX: Unspecified atrial fibrillation: I48.91

## 2016-08-09 LAB — POCT I-STAT 3, VENOUS BLOOD GAS (G3P V)
Bicarbonate: 25.1 mmol/L (ref 20.0–28.0)
O2 Saturation: 62 %
TCO2: 26 mmol/L (ref 0–100)
pCO2, Ven: 43.4 mmHg — ABNORMAL LOW (ref 44.0–60.0)
pH, Ven: 7.371 (ref 7.250–7.430)
pO2, Ven: 33 mmHg (ref 32.0–45.0)

## 2016-08-09 LAB — POCT ACTIVATED CLOTTING TIME
Activated Clotting Time: 246 seconds
Activated Clotting Time: 257 seconds
Activated Clotting Time: 263 seconds

## 2016-08-09 SURGERY — RIGHT/LEFT HEART CATH AND CORONARY ANGIOGRAPHY
Anesthesia: LOCAL

## 2016-08-09 MED ORDER — LABETALOL HCL 5 MG/ML IV SOLN: 10.0000 mg | INTRAVENOUS | Status: AC | PRN

## 2016-08-09 MED ORDER — ADENOSINE 12 MG/4ML IV SOLN
INTRAVENOUS | Status: AC
  Filled 2016-08-09: qty 16

## 2016-08-09 MED ORDER — LIDOCAINE HCL (PF) 1 % IJ SOLN
INTRAMUSCULAR | Status: DC | PRN
  Administered 2016-08-09: 1 mL via INTRADERMAL
  Administered 2016-08-09: 2 mL via INTRADERMAL

## 2016-08-09 MED ORDER — IOPAMIDOL (ISOVUE-370) INJECTION 76%
INTRAVENOUS | Status: DC | PRN
  Administered 2016-08-09: 160 mL via INTRA_ARTERIAL

## 2016-08-09 MED ORDER — TICAGRELOR 90 MG PO TABS
ORAL_TABLET | ORAL | Status: DC | PRN
  Administered 2016-08-09: 180 mg via ORAL

## 2016-08-09 MED ORDER — SODIUM CHLORIDE 0.9 % WEIGHT BASED INFUSION
3.0000 mL/kg/h | INTRAVENOUS | Status: DC
  Administered 2016-08-09: 3 mL/kg/h via INTRAVENOUS

## 2016-08-09 MED ORDER — IOPAMIDOL (ISOVUE-370) INJECTION 76%
INTRAVENOUS | Status: AC
  Filled 2016-08-09: qty 50

## 2016-08-09 MED ORDER — SODIUM CHLORIDE 0.9 % IV SOLN: 250.0000 mL | INTRAVENOUS | Status: DC | PRN

## 2016-08-09 MED ORDER — SODIUM CHLORIDE 0.9 % WEIGHT BASED INFUSION
1.0000 mL/kg/h | INTRAVENOUS | Status: AC
  Administered 2016-08-09: 1 mL/kg/h via INTRAVENOUS

## 2016-08-09 MED ORDER — ROSUVASTATIN CALCIUM 10 MG PO TABS
10.0000 mg | ORAL_TABLET | Freq: Every day | ORAL | Status: DC
  Administered 2016-08-09: 10 mg via ORAL
  Filled 2016-08-09: qty 1

## 2016-08-09 MED ORDER — SODIUM CHLORIDE 0.9% FLUSH
3.0000 mL | Freq: Two times a day (BID) | INTRAVENOUS | Status: DC
  Administered 2016-08-10 (×2): 3 mL via INTRAVENOUS

## 2016-08-09 MED ORDER — ASPIRIN 81 MG PO CHEW: 81.0000 mg | CHEWABLE_TABLET | ORAL | Status: DC

## 2016-08-09 MED ORDER — ADENOSINE (DIAGNOSTIC) 140MCG/KG/MIN
INTRAVENOUS | Status: DC | PRN
  Administered 2016-08-09: 140 ug/kg/min via INTRAVENOUS

## 2016-08-09 MED ORDER — LIDOCAINE HCL 1 % IJ SOLN
INTRAMUSCULAR | Status: AC
  Filled 2016-08-09: qty 20

## 2016-08-09 MED ORDER — HEPARIN (PORCINE) IN NACL 2-0.9 UNIT/ML-% IJ SOLN
INTRAMUSCULAR | Status: AC
  Filled 2016-08-09: qty 1000

## 2016-08-09 MED ORDER — VIPERSLIDE LUBRICANT OPTIME
TOPICAL | Status: DC | PRN
  Administered 2016-08-09: 20 mL via SURGICAL_CAVITY

## 2016-08-09 MED ORDER — VERAPAMIL HCL 2.5 MG/ML IV SOLN
INTRAVENOUS | Status: AC
  Filled 2016-08-09: qty 2

## 2016-08-09 MED ORDER — SODIUM CHLORIDE 0.9% FLUSH: 3.0000 mL | Freq: Two times a day (BID) | INTRAVENOUS | Status: DC

## 2016-08-09 MED ORDER — HEPARIN (PORCINE) IN NACL 2-0.9 UNIT/ML-% IJ SOLN
INTRAMUSCULAR | Status: AC | PRN
  Administered 2016-08-09: 1000 mL

## 2016-08-09 MED ORDER — ATROPINE SULFATE 1 MG/10ML IJ SOSY
PREFILLED_SYRINGE | INTRAMUSCULAR | Status: AC
  Filled 2016-08-09: qty 10

## 2016-08-09 MED ORDER — PROPRANOLOL HCL 40 MG PO TABS
40.0000 mg | ORAL_TABLET | Freq: Two times a day (BID) | ORAL | Status: DC
  Administered 2016-08-09 – 2016-08-10 (×2): 40 mg via ORAL
  Filled 2016-08-09 (×2): qty 1

## 2016-08-09 MED ORDER — TRIAMTERENE-HCTZ 37.5-25 MG PO TABS
1.0000 | ORAL_TABLET | Freq: Every day | ORAL | Status: DC
  Administered 2016-08-10: 1 via ORAL
  Filled 2016-08-09: qty 1

## 2016-08-09 MED ORDER — HEPARIN SODIUM (PORCINE) 1000 UNIT/ML IJ SOLN
INTRAMUSCULAR | Status: DC | PRN
  Administered 2016-08-09: 3000 [IU] via INTRAVENOUS
  Administered 2016-08-09: 4000 [IU] via INTRAVENOUS
  Administered 2016-08-09 (×2): 3000 [IU] via INTRAVENOUS

## 2016-08-09 MED ORDER — SODIUM CHLORIDE 0.9% FLUSH: 3.0000 mL | INTRAVENOUS | Status: DC | PRN

## 2016-08-09 MED ORDER — ONDANSETRON HCL 4 MG/2ML IJ SOLN: 4.0000 mg | Freq: Four times a day (QID) | INTRAMUSCULAR | Status: DC | PRN

## 2016-08-09 MED ORDER — TICAGRELOR 90 MG PO TABS
90.0000 mg | ORAL_TABLET | Freq: Two times a day (BID) | ORAL | Status: DC
  Administered 2016-08-10: 04:00:00 90 mg via ORAL
  Filled 2016-08-09 (×2): qty 1

## 2016-08-09 MED ORDER — HYDRALAZINE HCL 20 MG/ML IJ SOLN: 5.0000 mg | INTRAMUSCULAR | Status: AC | PRN

## 2016-08-09 MED ORDER — PRESERVISION AREDS PO CAPS: 1.0000 | ORAL_CAPSULE | Freq: Two times a day (BID) | ORAL | Status: DC

## 2016-08-09 MED ORDER — ASPIRIN EC 81 MG PO TBEC
81.0000 mg | DELAYED_RELEASE_TABLET | Freq: Every day | ORAL | Status: DC
  Administered 2016-08-10: 81 mg via ORAL
  Filled 2016-08-09: qty 1

## 2016-08-09 MED ORDER — NITROGLYCERIN 1 MG/10 ML FOR IR/CATH LAB
INTRA_ARTERIAL | Status: DC | PRN
  Administered 2016-08-09 (×3): 200 ug via INTRACORONARY

## 2016-08-09 MED ORDER — HEPARIN SODIUM (PORCINE) 1000 UNIT/ML IJ SOLN
INTRAMUSCULAR | Status: AC
Start: 2016-08-09 — End: ?
  Filled 2016-08-09: qty 1

## 2016-08-09 MED ORDER — NITROGLYCERIN 1 MG/10 ML FOR IR/CATH LAB
INTRA_ARTERIAL | Status: AC
  Filled 2016-08-09: qty 10

## 2016-08-09 MED ORDER — VERAPAMIL HCL 2.5 MG/ML IV SOLN
INTRA_ARTERIAL | Status: DC | PRN
  Administered 2016-08-09: 5 mL via INTRA_ARTERIAL

## 2016-08-09 MED ORDER — MIDAZOLAM HCL 2 MG/2ML IJ SOLN
INTRAMUSCULAR | Status: AC
  Filled 2016-08-09: qty 2

## 2016-08-09 MED ORDER — LOSARTAN POTASSIUM 50 MG PO TABS
50.0000 mg | ORAL_TABLET | Freq: Every day | ORAL | Status: DC
  Administered 2016-08-10: 09:00:00 50 mg via ORAL
  Filled 2016-08-09: qty 1

## 2016-08-09 MED ORDER — IOPAMIDOL (ISOVUE-370) INJECTION 76%
INTRAVENOUS | Status: AC
  Filled 2016-08-09: qty 100

## 2016-08-09 MED ORDER — TICAGRELOR 90 MG PO TABS
ORAL_TABLET | ORAL | Status: AC
  Filled 2016-08-09: qty 2

## 2016-08-09 MED ORDER — ACETAMINOPHEN 325 MG PO TABS: 650.0000 mg | ORAL_TABLET | ORAL | Status: DC | PRN

## 2016-08-09 MED ORDER — IPRATROPIUM-ALBUTEROL 0.5-2.5 (3) MG/3ML IN SOLN
3.0000 mL | RESPIRATORY_TRACT | Status: DC | PRN
  Administered 2016-08-09: 23:00:00 3 mL via RESPIRATORY_TRACT
  Filled 2016-08-09: qty 3

## 2016-08-09 MED ORDER — FENTANYL CITRATE (PF) 100 MCG/2ML IJ SOLN
INTRAMUSCULAR | Status: DC | PRN
  Administered 2016-08-09 (×2): 25 ug via INTRAVENOUS

## 2016-08-09 MED ORDER — ANGIOPLASTY BOOK
Freq: Once | Status: AC
  Administered 2016-08-10: 1
  Filled 2016-08-09: qty 1

## 2016-08-09 MED ORDER — MIDAZOLAM HCL 2 MG/2ML IJ SOLN
INTRAMUSCULAR | Status: DC | PRN
  Administered 2016-08-09 (×2): 1 mg via INTRAVENOUS

## 2016-08-09 MED ORDER — FENTANYL CITRATE (PF) 100 MCG/2ML IJ SOLN
INTRAMUSCULAR | Status: AC
  Filled 2016-08-09: qty 2

## 2016-08-09 MED ORDER — HEPARIN SODIUM (PORCINE) 1000 UNIT/ML IJ SOLN
INTRAMUSCULAR | Status: AC
  Filled 2016-08-09: qty 1

## 2016-08-09 MED ORDER — VITAMIN E 180 MG (400 UNIT) PO CAPS
400.0000 [IU] | ORAL_CAPSULE | Freq: Every day | ORAL | Status: DC
  Administered 2016-08-10: 400 [IU] via ORAL
  Filled 2016-08-09: qty 1

## 2016-08-09 MED ORDER — TAMSULOSIN HCL 0.4 MG PO CAPS
0.4000 mg | ORAL_CAPSULE | Freq: Every day | ORAL | Status: DC
  Administered 2016-08-09: 0.4 mg via ORAL
  Filled 2016-08-09: qty 1

## 2016-08-09 MED ORDER — VITAMIN D 1000 UNITS PO TABS
1000.0000 [IU] | ORAL_TABLET | Freq: Every day | ORAL | Status: DC
  Administered 2016-08-10: 1000 [IU] via ORAL
  Filled 2016-08-09 (×2): qty 1

## 2016-08-09 MED ORDER — SODIUM CHLORIDE 0.9 % WEIGHT BASED INFUSION: 1.0000 mL/kg/h | INTRAVENOUS | Status: DC

## 2016-08-09 SURGICAL SUPPLY — 24 items
BALLN MAVERICK OTW 2.0X15 (BALLOONS) ×2
BALLOON MAVERICK OTW 2.0X15 (BALLOONS) ×1 IMPLANT
CATH BALLN WEDGE 5F 110CM (CATHETERS) ×2 IMPLANT
CATH HEARTRAIL 6F IL3.5 (CATHETERS) ×2 IMPLANT
CATH OPTITORQUE TIG 4.0 5F (CATHETERS) ×2 IMPLANT
CROWN DIAMONDBACK CLASSIC 1.25 (BURR) ×2 IMPLANT
DEVICE RAD COMP TR BAND LRG (VASCULAR PRODUCTS) ×2 IMPLANT
GLIDESHEATH SLEND A-KIT 6F 20G (SHEATH) ×2 IMPLANT
GUIDEWIRE .025 260CM (WIRE) ×2 IMPLANT
GUIDEWIRE INQWIRE 1.5J.035X260 (WIRE) ×1 IMPLANT
GUIDEWIRE PRESSURE COMET II (WIRE) ×2 IMPLANT
INQWIRE 1.5J .035X260CM (WIRE) ×2
KIT ENCORE 26 ADVANTAGE (KITS) ×2 IMPLANT
KIT HEART LEFT (KITS) ×2 IMPLANT
LUBRICANT VIPERSLIDE CORONARY (MISCELLANEOUS) ×2 IMPLANT
PACK CARDIAC CATHETERIZATION (CUSTOM PROCEDURE TRAY) ×2 IMPLANT
SHEATH GLIDE SLENDER 4/5FR (SHEATH) ×2 IMPLANT
STENT RESOLUTE ONYX 3.0X18 (Permanent Stent) ×2 IMPLANT
STENT RESOLUTE ONYX 3.0X26 (Permanent Stent) ×2 IMPLANT
TRANSDUCER W/STOPCOCK (MISCELLANEOUS) ×2 IMPLANT
TUBING CIL FLEX 10 FLL-RA (TUBING) ×2 IMPLANT
WIRE HI TORQ BMW 190CM (WIRE) ×2 IMPLANT
WIRE MAILMAN 300CM (WIRE) ×2 IMPLANT
WIRE VIPER ADVANCE COR .012TIP (WIRE) ×2 IMPLANT

## 2016-08-09 NOTE — CV Procedure (Signed)
Patient with mild aortic stenosis, history of prostate cancer status post radiation seed implantation in 2011, has had CT angiogram of the abdomen and at that time had revealed severe three-vessel coronary calcification and also abdominal aortic calcification. Over the past 2 months she has noticed severe worsening dyspnea. With a suspicion for multivessel disease versus major coronary artery disease, he was directly scheduled for cardiac catheterization to evaluate coronary anatomy. Right heart catheterization was also performed to evaluate for pulmonary hypertension given dyspnea on exertion.  Right heart catheterization: Pulmonary hypertension. Preserved cardiac output and cardiac index.  Left ventricle LV systolic function is normal. No wall motion abnormality left main: Mild calcified. Next progress circumflex carotid artery: Moderately calcified diffusely. OM1 has ostial 30% stenosis, OM 2 mild diffuse luminal irregularity. Circumflex with mild luminal irregularity. No high-grade stenosis.  LAD gives origin to a moderate size diagonal 1. The LAD is severely calcified. Ostial D1 has a 50% stenosis. Mid to distal LAD shows eccentric calcified tandem lesions 1 at the origin of the diagonal and the second one distal to the origin. They've at least 70-80%, in 1 view and in other views appear to be 60%. FFR was strongly positive, FFR was 0.74 with adenosine infusion.  Right coronary artery, large vessel, mildly calcified. Gives origin to large PL branch and small to moderate size PDA which is mildly diffusely diseased. By mouth branch courses large area of distribution.  Interventional data: Successful orbital atherectomy using CSI coronary atherectomy followed by stenting with 2 overlapping 3.0 x 26 and 3.0 x 18 mm onyx resolute DES. 90% stenosis reduced to 0% with maintenance of TIMI-3 flow.  160 mL contrast utilized. 2 mg of Versed and 50 g fentanyl given. Sedation time 93 minutes. Patient tolerated  the procedure well. No immediate complication.

## 2016-08-09 NOTE — Progress Notes (Signed)
TR BAND REMOVAL  LOCATION:   R radial  DEFLATED PER PROTOCOL:    Yes  TIME BAND OFF / DRESSING APPLIED:   22:00  SITE UPON ARRIVAL:    Level 0  SITE AFTER BAND REMOVAL:    Level  1  MOTION SENSATION AND MOVEMENT:    Within Normal Limits   Yes  COMMENTS:  Pt tolerated procedure well. Area cleansed and 2x2 & occlusive dsg applied. VSS. Ecchymosis at insertion site noted.

## 2016-08-09 NOTE — Progress Notes (Signed)
Site area: R Brachial  Site Prior to Removal:  Level 0 but oozing noted on dressing. Once dressing removed, small hematoma found.  Pressure Applied For 20MINUTES    Minutes Beginning at 21:17  Manual:   Yes  Patient Status During Pull:  Stable  Post PullSite:  Level 1  Post Pull Instructions Given:  Yes  Post Pull Pulses Present:  Yes  Dressing Applied:  Yes  Comments: Pt tolerated procedure well.Dressing applied.                      VSS

## 2016-08-09 NOTE — Interval H&P Note (Signed)
Cath Lab Visit (complete for each Cath Lab visit)  Clinical Evaluation Leading to the Procedure:   ACS: No.  Non-ACS:    Anginal Classification: CCS III  Anti-ischemic medical therapy: Minimal Therapy (1 class of medications)  Non-Invasive Test Results: No non-invasive testing performed  Prior CABG: No previous CABG      History and Physical Interval Note:  08/09/2016 3:03 PM  [name]  has presented today for surgery, with the diagnosis of chf  The various methods of treatment have been discussed with the patient and family. After consideration of risks, benefits and other options for treatment, the patient has consented to  Procedure(s): Right/Left Heart Cath and Coronary Angiography (N/A) as a surgical intervention .  The patient's history has been reviewed, patient examined, no change in status, stable for surgery.  I have reviewed the patient's chart and labs.  Questions were answered to the patient's satisfaction.     Adrian Prows

## 2016-08-10 ENCOUNTER — Encounter (HOSPITAL_COMMUNITY): Payer: Self-pay | Admitting: Cardiology

## 2016-08-10 DIAGNOSIS — I700 Atherosclerosis of aorta: Secondary | ICD-10-CM | POA: Diagnosis not present

## 2016-08-10 DIAGNOSIS — I110 Hypertensive heart disease with heart failure: Secondary | ICD-10-CM | POA: Diagnosis not present

## 2016-08-10 DIAGNOSIS — I080 Rheumatic disorders of both mitral and aortic valves: Secondary | ICD-10-CM | POA: Diagnosis not present

## 2016-08-10 DIAGNOSIS — Z7902 Long term (current) use of antithrombotics/antiplatelets: Secondary | ICD-10-CM | POA: Diagnosis not present

## 2016-08-10 DIAGNOSIS — Z7982 Long term (current) use of aspirin: Secondary | ICD-10-CM | POA: Diagnosis not present

## 2016-08-10 DIAGNOSIS — G20 Parkinson's disease: Secondary | ICD-10-CM | POA: Diagnosis not present

## 2016-08-10 DIAGNOSIS — Z923 Personal history of irradiation: Secondary | ICD-10-CM | POA: Diagnosis not present

## 2016-08-10 DIAGNOSIS — Z8546 Personal history of malignant neoplasm of prostate: Secondary | ICD-10-CM | POA: Diagnosis not present

## 2016-08-10 DIAGNOSIS — I25118 Atherosclerotic heart disease of native coronary artery with other forms of angina pectoris: Secondary | ICD-10-CM | POA: Diagnosis not present

## 2016-08-10 DIAGNOSIS — I272 Pulmonary hypertension, unspecified: Secondary | ICD-10-CM | POA: Diagnosis not present

## 2016-08-10 DIAGNOSIS — E7800 Pure hypercholesterolemia, unspecified: Secondary | ICD-10-CM | POA: Diagnosis not present

## 2016-08-10 DIAGNOSIS — I25119 Atherosclerotic heart disease of native coronary artery with unspecified angina pectoris: Secondary | ICD-10-CM | POA: Diagnosis not present

## 2016-08-10 DIAGNOSIS — I509 Heart failure, unspecified: Secondary | ICD-10-CM | POA: Diagnosis not present

## 2016-08-10 DIAGNOSIS — R0609 Other forms of dyspnea: Secondary | ICD-10-CM | POA: Diagnosis not present

## 2016-08-10 LAB — CBC
HCT: 37.6 % — ABNORMAL LOW (ref 39.0–52.0)
Hemoglobin: 12.4 g/dL — ABNORMAL LOW (ref 13.0–17.0)
MCH: 29.7 pg (ref 26.0–34.0)
MCHC: 33 g/dL (ref 30.0–36.0)
MCV: 90.2 fL (ref 78.0–100.0)
Platelets: 133 10*3/uL — ABNORMAL LOW (ref 150–400)
RBC: 4.17 MIL/uL — ABNORMAL LOW (ref 4.22–5.81)
RDW: 13.3 % (ref 11.5–15.5)
WBC: 5.3 10*3/uL (ref 4.0–10.5)

## 2016-08-10 LAB — BASIC METABOLIC PANEL
Anion gap: 11 (ref 5–15)
BUN: 25 mg/dL — ABNORMAL HIGH (ref 6–20)
CO2: 22 mmol/L (ref 22–32)
Calcium: 8.6 mg/dL — ABNORMAL LOW (ref 8.9–10.3)
Chloride: 99 mmol/L — ABNORMAL LOW (ref 101–111)
Creatinine, Ser: 1.21 mg/dL (ref 0.61–1.24)
GFR calc Af Amer: >60 mL/min (ref >60)
GFR calc non Af Amer: 56 mL/min — ABNORMAL LOW (ref >60)
Glucose, Bld: 85 mg/dL (ref 65–99)
Potassium: 3.8 mmol/L (ref 3.5–5.1)
Sodium: 132 mmol/L — ABNORMAL LOW (ref 135–145)

## 2016-08-10 LAB — POCT ACTIVATED CLOTTING TIME: Activated Clotting Time: 169 seconds

## 2016-08-10 MED ORDER — TICAGRELOR 90 MG PO TABS: 90.0000 mg | ORAL_TABLET | Freq: Two times a day (BID) | ORAL | 0 refills | Status: DC

## 2016-08-10 NOTE — Care Management Note (Signed)
Case Management Note  Patient Details  Name: [name] MRN: [id number] Date of Birth: 1935/05/25  Subjective/Objective:    From home, s/p stent intervention, will be on brilinta, NCM awaiting benefit check.  Patient has medicare and [employer].  He will be going to [facility] [facility] on [address] to get his 30 day free and they his co pay is 42.00.  PCP  Jonna Munro at [facility] [phone]              Action/Plan: NCM will follow for dc needs.   Expected Discharge Date:                  Expected Discharge Plan:  Home/Self Care  In-House Referral:     Discharge planning Services  CM Consult  Post Acute Care Choice:    Choice offered to:     DME Arranged:    DME Agency:     HH Arranged:    [place] Agency:     Status of Service:  Completed, signed off  If discussed at [employer] of Stay Meetings, dates discussed:    Additional Comments:  Zenon Mayo, RN 08/10/2016, 8:24 AM

## 2016-08-10 NOTE — Discharge Summary (Signed)
Physician Discharge Summary  Patient ID: [name] MRN: [id number] DOB/AGE: 10-28-1935 81 y.o.  Admit date: 08/09/2016 Discharge date: 08/10/2016  Primary Discharge Diagnosis Dyspnea on exertion CAD of native vessels with angina  Secondary Discharge Diagnosis Hyperlipidemia Hypertension Mild aortic stenosis Significant Diagnostic Studies: 08/09/2016: Right heart catheterization: Very mild Pulmonary hypertension. Preserved cardiac output and cardiac index.  Left ventricle LV systolic function is normal. No wall motion abnormality left main: Mild calcified.   Circumflex coronary artery: Moderately calcified diffusely. OM1 has ostial 30% stenosis, OM 2 mild diffuse luminal irregularity. Circumflex with mild luminal irregularity. No high-grade stenosis.  LAD gives origin to a moderate size diagonal 1. The LAD is severely calcified. Ostial D1 has a 50% stenosis. Mid to distal LAD shows eccentric calcified tandem lesions 1 at the origin of the diagonal and the second one distal to the origin. They've at least 70-80%, in 1 view and in other views appear to be 60%. FFR was strongly positive, FFR was 0.74 with adenosine infusion.  Right coronary artery:  It is a large vessel, mildly calcified. Gives origin to large PL branch and small to moderate size PDA which is mildly diffusely diseased. By mouth branch courses large area of distribution.  Interventional data: FFR guided successful orbital atherectomy using CSI coronary atherectomy followed by stenting with 2 overlapping 3.0 x 26 and 3.0 x 18 mm onyx resolute DES. 90% stenosis reduced to 0% with maintenance of TIMI-3 flow.  Hospital Course: Patient admitted for elective coronary angiography due to class 3-4 symptoms of new onset of dyspnea. Underwent right and left heart catheterization. Was found to have high-grade stenosis in the LAD for which he underwent successful atherectomy followed by stenting. The following morning he  was felt stable for discharge. He did experience additional shortness of breath after administration of Brilinta which is probably felt to be due to medication side effect.  Recommendations on discharge: He will need aspirin indefinitely, I will continue Brilinta at least for 4-6 weeks and switch him to Plavix unless he develops continued dyspnea due to medication. Advised him to use caffeine drinks prior to taking the medication. Expect improvement in symptoms of dyspnea as it was a very high-grade stenosis.  Discharge Exam: Blood pressure (!) 170/76, pulse 63, temperature 97.9 F (36.6 C), temperature source Oral, resp. rate (!) 22, height 5\' 11"  (1.803 m), weight 91.6 kg (202 lb), SpO2 100 %.   General appearance: alert, cooperative, appears stated age and Flat affect Resp: clear to auscultation bilaterally Cardio: S1, S2 normal, no click, no rub and Distant heart sounds. GI: soft, non-tender; bowel sounds normal; no masses,  no organomegaly Extremities: extremities normal, atraumatic, no cyanosis or edema Pulses: 2+ and symmetric Neurologic: Grossly normal Labs:   Lab Results  Component Value Date   WBC 5.3 08/10/2016   HGB 12.4 (L) 08/10/2016   HCT 37.6 (L) 08/10/2016   MCV 90.2 08/10/2016   PLT 133 (L) 08/10/2016    Recent Labs Lab 08/10/16 0303  NA 132*  K 3.8  CL 99*  CO2 22  BUN 25*  CREATININE 1.21  CALCIUM 8.6*  GLUCOSE 85   EKG: 08/10/2016: Normal sinus rhythm, left axis deviation. No evidence of ischemia.  FOLLOW UP PLANS AND APPOINTMENTS  Allergies as of 08/10/2016   No Known Allergies     Medication List    TAKE these medications   aspirin EC 81 MG tablet Take 81 mg by mouth daily.   cholecalciferol 1000 units tablet Commonly  known as:  VITAMIN D Take 1,000 Units by mouth daily.   losartan 50 MG tablet Commonly known as:  COZAAR Take 50 mg by mouth daily.   PRESERVISION AREDS Caps Take 1 capsule by mouth 2 (two) times daily.   propranolol 40  MG tablet Commonly known as:  INDERAL Take 40 mg by mouth 2 (two) times daily.   rosuvastatin 10 MG tablet Commonly known as:  CRESTOR Take 10 mg by mouth at bedtime.   tamsulosin 0.4 MG Caps capsule Commonly known as:  FLOMAX Take 0.4 mg by mouth at bedtime.   ticagrelor 90 MG Tabs tablet Commonly known as:  BRILINTA Take 1 tablet (90 mg total) by mouth every 12 (twelve) hours.   triamterene-hydrochlorothiazide 37.5-25 MG tablet Commonly known as:  MAXZIDE-25 Take 1 tablet by mouth daily.   vitamin E 400 UNIT capsule Take 400 Units by mouth daily.      Follow-up Information    Adrian Prows, MD Follow up.   Specialty:  Cardiology Why:  Keep previous appointment Contact information: [address] [place] [address] [phone]            Adrian Prows, MD 08/10/2016, 7:38 AM  Pager: [phone] Office: [phone] If no answer: [phone]

## 2016-08-10 NOTE — Progress Notes (Signed)
CARDIAC REHAB PHASE I   PRE:  Rate/Rhythm: 64 SR    BP: sitting 173/79    SaO2:   MODE:  Ambulation: 300 ft   POST:  Rate/Rhythm: 75 SR    BP: sitting 180/76     SaO2:   Pt unsteady due to bad knees. Slow pace. C/o SOB/wheezing with distance. BP elevated. Ed completed with pt and significant other. Understands importance of Brilinta/ASA. Will refer to [place]. Gave pt guidelines for riding stationary bicycle starting out since his knees are bad. [place], ACSM 08/10/2016 10:27 AM

## 2016-08-11 ENCOUNTER — Encounter (INDEPENDENT_AMBULATORY_CARE_PROVIDER_SITE_OTHER): Payer: Self-pay | Admitting: Ophthalmology

## 2016-08-11 LAB — POCT ACTIVATED CLOTTING TIME: Activated Clotting Time: 219 seconds

## 2016-08-12 ENCOUNTER — Telehealth (HOSPITAL_COMMUNITY): Payer: Self-pay

## 2016-08-12 NOTE — Telephone Encounter (Signed)
Patient insurances is active and benefits verified. Patient has Medicare A/B and [employer] medicare supplement. Medicare A/B - no co-payment, deductible $183.00/$183.00 has been met and no out of pocket, 20% co-insurance, no pre-authorization, and no limit on visit. Passport/reference [phone].  [employer] medicare supplement - no co-pay, no deductible, no out of pocket and no co-insurance. Follow medicare guidelines passport/reference [phone].

## 2016-08-19 DIAGNOSIS — I10 Essential (primary) hypertension: Secondary | ICD-10-CM | POA: Diagnosis not present

## 2016-08-19 DIAGNOSIS — I25119 Atherosclerotic heart disease of native coronary artery with unspecified angina pectoris: Secondary | ICD-10-CM | POA: Diagnosis not present

## 2016-08-19 DIAGNOSIS — G20 Parkinson's disease: Secondary | ICD-10-CM | POA: Diagnosis not present

## 2016-08-19 DIAGNOSIS — R0609 Other forms of dyspnea: Secondary | ICD-10-CM | POA: Diagnosis not present

## 2016-08-23 ENCOUNTER — Encounter: Payer: Self-pay | Admitting: Neurology

## 2016-09-06 NOTE — Progress Notes (Signed)
[name] was seen today in the movement disorders clinic for neurologic consultation at the request of Adrian Prows, MD.  This patient is accompanied in the office by his partner who supplements the history.  The consultation is for the evaluation of gait abnormality and to r/o PD.  The records that were made available to me were reviewed.  Specific Symptoms:  Tremor: Yes.  , L hand at rest (better in last 3 days) Family hx of similar:  No. Voice: less robust (pt thinks that this is due to his "heart procedure") Sleep: trouble staying asleep due to going to RR  Vivid Dreams:  No.  Acting out dreams:  No. Wet Pillows: No. Postural symptoms:  Yes.  , patient attributes to bad knees and back pain  Falls?  Yes.  , 2 falls (one with a backpack on; one bending over and picking up something but both were quite some time ago) Bradykinesia symptoms: slow movements, difficulty getting out of a chair and decreased stride length; does sleep in a chair but that is because of neck pain Loss of smell:  No. Loss of taste:  No. Urinary Incontinence:  No. Difficulty Swallowing:  No. Handwriting, micrographia: No. (is R hand dominant) Trouble with ADL's:  No.  Trouble buttoning clothing: No. Depression:  No. Memory changes:  No. (but some trouble with driving directions per SO) Hallucinations:  No.  visual distortions: No. N/V:  No. Lightheaded:  Yes.  , occasionally after takes his medication  Syncope: No. Diplopia:  No. Dyskinesia:  No.  Neuroimaging has not previously been performed.   PREVIOUS MEDICATIONS: none to date  ALLERGIES:  No Known Allergies  CURRENT MEDICATIONS:  Outpatient Encounter Prescriptions as of 09/08/2016  Medication Sig  . aspirin EC 81 MG tablet Take 81 mg by mouth daily.  . cholecalciferol (VITAMIN D) 1000 units tablet Take 1,000 Units by mouth daily.  . clopidogrel (PLAVIX) 75 MG tablet Take 75 mg by mouth daily.  [name] losartan (COZAAR) 50 MG  tablet Take 50 mg by mouth daily.  . Multiple Vitamins-Minerals (PRESERVISION AREDS) CAPS Take 1 capsule by mouth 2 (two) times daily.  . propranolol (INDERAL) 40 MG tablet Take 40 mg by mouth 2 (two) times daily.  . rosuvastatin (CRESTOR) 10 MG tablet Take 10 mg by mouth at bedtime.  . tamsulosin (FLOMAX) 0.4 MG CAPS capsule Take 0.4 mg by mouth at bedtime.  . triamterene-hydrochlorothiazide (MAXZIDE-25) 37.5-25 MG tablet Take 1 tablet by mouth daily.  . vitamin E 400 UNIT capsule Take 400 Units by mouth daily.  . [DISCONTINUED] ticagrelor (BRILINTA) 90 MG TABS tablet Take 1 tablet (90 mg total) by mouth every 12 (twelve) hours.   No facility-administered encounter medications on file as of 09/08/2016.     PAST MEDICAL HISTORY:   Past Medical History:  Diagnosis Date  . Arthritis    "knees; lower back" (08/09/2016)  . Atrial fibrillation ([place])    remote hx/notes 08/07/2016  . Episodes of trembling    "most of the time it's my left hand" (08/09/2016)  . High cholesterol   . History of kidney stones   . Hypertension   . Melanoma of shoulder ([place]) 2000s   "left"  . Mitral regurgitation and mitral stenosis    hx/notes 08/07/2016  . Moderate aortic stenosis    hx/notes 08/07/2016  . Prostate cancer ([place]) 2011   hx/notes 08/07/2016    PAST SURGICAL HISTORY:   Past Surgical History:  Procedure Laterality  Date  . CARDIAC CATHETERIZATION  ~ [address] [place]"  . CATARACT EXTRACTION W/ INTRAOCULAR LENS  IMPLANT, BILATERAL Bilateral 2014-02/2016   left-right; hx/notes 08/07/2016  . CORONARY ANGIOPLASTY WITH STENT PLACEMENT  08/09/2016  . CORONARY ATHERECTOMY N/A 08/09/2016   Procedure: Coronary Atherectomy;  Surgeon: Adrian Prows, MD;  Location: [facility];  Service: Cardiovascular;  Laterality: N/A;  . CORONARY STENT INTERVENTION N/A 08/09/2016   Procedure: Coronary Stent Intervention;  Surgeon: Adrian Prows, MD;  Location: [facility];  Service: Cardiovascular;  Laterality:  N/A;  LAD  . EYE SURGERY Bilateral 05/2016   "laser on both lenses"  . INGUINAL HERNIA REPAIR Left 1991  . INSERTION PROSTATE RADIATION SEED  06/25/2009  . INTRAVASCULAR PRESSURE WIRE/FFR STUDY N/A 08/09/2016   Procedure: Intravascular Pressure Wire/FFR Study;  Surgeon: Adrian Prows, MD;  Location: [facility];  Service: Cardiovascular;  Laterality: N/A;  . LAPAROSCOPIC CHOLECYSTECTOMY  1993  . MELANOMA EXCISION Left 1990s   "shoulder"  . RIGHT/LEFT HEART CATH AND CORONARY ANGIOGRAPHY N/A 08/09/2016   Procedure: Right/Left Heart Cath and Coronary Angiography;  Surgeon: Adrian Prows, MD;  Location: [facility];  Service: Cardiovascular;  Laterality: N/A;    SOCIAL HISTORY:   Social History   Social History  . Marital status: Divorced    Spouse name: N/A  . Number of children: N/A  . Years of education: N/A   Occupational History  . retired     food business - Biochemist, clinical   Social History Main Topics  . Smoking status: Former Smoker    Years: 0.20    Quit date: 61  . Smokeless tobacco: Never Used     Comment: "smoked  2 months when I was 20; non since" (08/09/2016)  . Alcohol use 8.4 oz/week    14 Shots of liquor per week     Comment: 2 drinks per day (can[place] mist)  . Drug use: No  . Sexual activity: Not Currently   Other Topics Concern  . Not on file   Social History Narrative  . No narrative on file    FAMILY HISTORY:   Family Status  Relation Status  . Mother Deceased  . Father Deceased  . Sister Deceased  . Brother Alive  . Daughter Alive    ROS:  A complete 10 system review of systems was obtained and was unremarkable apart from what is mentioned above.  PHYSICAL EXAMINATION:    VITALS:   Vitals:   09/08/16 0837  BP: 104/62  Pulse: 62  SpO2: 96%  Weight: 209 lb (94.8 kg)  Height: '5\' 11"'  (1.803 m)    GEN:  The patient appears stated age and is in NAD. HEENT:  Normocephalic, atraumatic.  The mucous membranes are moist. The superficial  temporal arteries are without ropiness or tenderness. CV:  RRR Lungs:  CTAB.  He has DOE.   Neck/HEME:  There are no carotid bruits bilaterally.  Neurological examination:  Orientation: The patient is alert and oriented x3. Fund of knowledge is appropriate.  Recent and remote memory are intact.  Attention and concentration are normal.    Able to name objects and repeat phrases. Cranial nerves: There is good facial symmetry. Pupils are equal round and reactive to light bilaterally. Fundoscopic exam is attempted but the disc margins are not well visualized bilaterally. Extraocular muscles are intact with the exception of R esotropia (hx of lazy eye as a kid). The visual fields are full to  confrontational testing. The speech is fluent and clear. Soft palate rises symmetrically and there is no tongue deviation. Hearing is intact to conversational tone. Sensation: Sensation is intact to light and pinprick throughout (facial, trunk, extremities). Vibration is intact at the bilateral big toe. There is no extinction with double simultaneous stimulation. There is no sensory dermatomal level identified. Motor: Strength is 5/5 in the bilateral upper and lower extremities.   Shoulder shrug is equal and symmetric.  There is no pronator drift. Deep tendon reflexes: Deep tendon reflexes are 2-/4 at the bilateral biceps, triceps, brachioradialis, 3/4 at the bilateral patella with cross adductor reflexes and 1/4 at the bilateral achilles. Plantar responses are downgoing bilaterally.  Movement examination: Tone: There is mod to severe increased tremor in the LUE/LLE and mild to mod increased tremor in the RUE/RLE.   Abnormal movements: There is LUE resting tremor.  There is RUE resting tremor with distraction.   There is chin tremor Coordination:  There is decremation with RAM's, with any form of RAMS, including alternating supination and pronation of the forearm, hand opening and closing, finger taps, heel taps and  toe taps on the L.  RAM's on the R are fairly good. Gait and Station: The patient has difficulty arising out of a deep-seated chair without the use of the hands and nearly falls back (examiner stabilized him). The patient's stride length is mildly decreased and there is virtually no arm swing bilaterally.  The patient has a negative pull test.      Labs:  Patient had lab work done on 08/04/2016.  Sodium was 138, potassium 4.8, chloride 94, CO2 26, BUN 26, creatinine 1.39, glucose 101.  White blood cells were 3.9, hemoglobin 14.1, hematocrit 43.4 and platelets 204.  ASSESSMENT/PLAN:  1.  Idiopathic Parkinson's disease.  The patient has tremor, bradykinesia, rigidity and postural instability.  -We discussed the diagnosis as well as pathophysiology of the disease.  We discussed treatment options as well as prognostic indicators.  Patient education was provided.  -We discussed that it used to be thought that levodopa would increase risk of melanoma but now it is believed that Parkinsons itself likely increases risk of melanoma. he is to get regular skin checks.  He sees Dr. Jarome Matin.  -Greater than 50% of the 60 minute visit was spent in counseling answering questions and talking about what to expect now as well as in the future.  We talked about medication options as well as potential future surgical options.  We talked about safety in the home.  -We decided to add carbidopa/levodopa 25/100.  1/2 tab tid x 1 wk, then 1/2 in am & noon & 1 at night for a week, then 1/2 in am &1 at noon &night for a week, then 1 po tid.  Risks, benefits, side effects and alternative therapies were discussed.  The opportunity to ask questions was given and they were answered to the best of my ability.  The patient expressed understanding and willingness to follow the outlined treatment protocols.  -I will refer the patient to the Parkinson's program at the [facility], for PT  We talked about the importance  of safe, cardiovascular exercise in Parkinson's disease but he will need permission from his cardiologist to attend.  -We discussed community resources in the area including patient support groups and community exercise programs for PD and pt education was provided to the patient.  He met with our Education officer, museum today.  -do TSH today  2.  Follow  up is anticipated in the next few months, sooner should new neurologic issues arise.    Cc:  Marchelle Gearing, MD

## 2016-09-08 ENCOUNTER — Ambulatory Visit (INDEPENDENT_AMBULATORY_CARE_PROVIDER_SITE_OTHER): Payer: Medicare Other | Admitting: Neurology

## 2016-09-08 ENCOUNTER — Other Ambulatory Visit: Payer: Medicare Other

## 2016-09-08 ENCOUNTER — Encounter: Payer: Self-pay | Admitting: Neurology

## 2016-09-08 VITALS — BP 104/62 | HR 62 | Ht 71.0 in | Wt 209.0 lb

## 2016-09-08 DIAGNOSIS — G20 Parkinson's disease: Secondary | ICD-10-CM | POA: Diagnosis not present

## 2016-09-08 DIAGNOSIS — R251 Tremor, unspecified: Secondary | ICD-10-CM

## 2016-09-08 MED ORDER — CARBIDOPA-LEVODOPA 25-100 MG PO TABS: 1.0000 | ORAL_TABLET | Freq: Three times a day (TID) | ORAL | 3 refills | Status: DC

## 2016-09-08 NOTE — Progress Notes (Signed)
[facility] Clinical Social Work Note  CSW received consult from Dr. Carles Collet to meet with pt and pt wife during today's initial visit. Per Dr. Carles Collet, pt new diagnosis of Parkinson's disease. CSW met with pt and pt wife in exam room. CSW informed of social work services including connection to community resources or Scientist, clinical (histocompatibility and immunogenetics) and short-term coping/problem-solving counseling. CSW discussed local PD specific exercise classes. Support provided as pt shared that he recently had stents placed in his heart and will soon start cardiac rehab. Pt recognizes that he'll need cardiac clearance before beginning one of the exercise programs. Pt wife shared that Dr. Carles Collet is also referring pt to [facility]. CSW discussed that neurorehab will be beneficial and the therapist can help determine which exercise programs following neurorehab may be a good fit for pt. CSW discussed support through attending [facility] and that this group also offers support to the care partner. CSW provided folder of information with description of exercise programs. Pt and pt wife given opportunity for questions. Pt and pt wife appreciative of resources and plan to explore further once pt completes cardiac rehab and receives clearance from pt cardiologist. Pt and pt wife encouraged to call with any questions or concerns. Pt and pt wife agree to contact if any further social work needs arise.  Alison Murray, MSW, LCSW Clinical Social Worker [facility] [phone]

## 2016-09-08 NOTE — Patient Instructions (Signed)
1. Start Carbidopa Levodopa as follows:  Take 1/2 tablet three times daily, at least 30 minutes before meals, for one week  Then take 1/2 tablet in the morning, 1/2 tablet in the afternoon, 1 tablet in the evening, at least 30 minutes before meals, for one week  Then take 1/2 tablet in the morning, 1 tablet in the afternoon, 1 tablet in the evening, at least 30 minutes before meals, for one week  Then take 1 tablet three times daily, at least 30 minutes before meals  2. You have been referred to Neuro Rehab for physical therapy. They will call you directly to schedule an appointment.  Please call [phone] if you do not hear from them.   3. Your provider has requested that you have labwork completed today. Please go to [facility] ([id number]) on the second floor of this building before leaving the office today. You do not need to check in. If you are not called within 15 minutes please check with the front desk.

## 2016-09-08 NOTE — Addendum Note (Signed)
Addended byAnnamaria Helling on: 09/08/2016 09:52 AM   Modules accepted: Orders

## 2016-09-10 ENCOUNTER — Encounter: Payer: Self-pay | Admitting: Neurology

## 2016-09-12 ENCOUNTER — Encounter: Payer: Self-pay | Admitting: Neurology

## 2016-09-12 MED ORDER — CARBIDOPA-LEVODOPA 25-100 MG PO TABS: 1.0000 | ORAL_TABLET | Freq: Three times a day (TID) | ORAL | 0 refills | Status: DC

## 2016-09-15 ENCOUNTER — Other Ambulatory Visit: Payer: Medicare Other

## 2016-09-15 DIAGNOSIS — R251 Tremor, unspecified: Secondary | ICD-10-CM | POA: Diagnosis not present

## 2016-09-15 LAB — TSH: TSH: 2.94 mIU/L (ref 0.40–4.50)

## 2016-09-16 ENCOUNTER — Telehealth: Payer: Self-pay | Admitting: Neurology

## 2016-09-16 DIAGNOSIS — Z0000 Encounter for general adult medical examination without abnormal findings: Secondary | ICD-10-CM | POA: Diagnosis not present

## 2016-09-16 DIAGNOSIS — D1801 Hemangioma of skin and subcutaneous tissue: Secondary | ICD-10-CM | POA: Diagnosis not present

## 2016-09-16 DIAGNOSIS — D692 Other nonthrombocytopenic purpura: Secondary | ICD-10-CM | POA: Diagnosis not present

## 2016-09-16 DIAGNOSIS — E785 Hyperlipidemia, unspecified: Secondary | ICD-10-CM | POA: Diagnosis not present

## 2016-09-16 DIAGNOSIS — N189 Chronic kidney disease, unspecified: Secondary | ICD-10-CM | POA: Diagnosis not present

## 2016-09-16 DIAGNOSIS — L738 Other specified follicular disorders: Secondary | ICD-10-CM | POA: Diagnosis not present

## 2016-09-16 DIAGNOSIS — D485 Neoplasm of uncertain behavior of skin: Secondary | ICD-10-CM | POA: Diagnosis not present

## 2016-09-16 DIAGNOSIS — Z85828 Personal history of other malignant neoplasm of skin: Secondary | ICD-10-CM | POA: Diagnosis not present

## 2016-09-16 DIAGNOSIS — Z79899 Other long term (current) drug therapy: Secondary | ICD-10-CM | POA: Diagnosis not present

## 2016-09-16 DIAGNOSIS — Z85820 Personal history of malignant melanoma of skin: Secondary | ICD-10-CM | POA: Diagnosis not present

## 2016-09-16 DIAGNOSIS — L821 Other seborrheic keratosis: Secondary | ICD-10-CM | POA: Diagnosis not present

## 2016-09-16 DIAGNOSIS — L570 Actinic keratosis: Secondary | ICD-10-CM | POA: Diagnosis not present

## 2016-09-16 DIAGNOSIS — I10 Essential (primary) hypertension: Secondary | ICD-10-CM | POA: Diagnosis not present

## 2016-09-16 DIAGNOSIS — Z1212 Encounter for screening for malignant neoplasm of rectum: Secondary | ICD-10-CM | POA: Diagnosis not present

## 2016-09-16 DIAGNOSIS — C44629 Squamous cell carcinoma of skin of left upper limb, including shoulder: Secondary | ICD-10-CM | POA: Diagnosis not present

## 2016-09-16 NOTE — Telephone Encounter (Signed)
-----   Message from [place], DO sent at 09/16/2016  7:39 AM EDT ----- I have reviewed all lab results which are normal or stable. Please inform the patient.

## 2016-09-16 NOTE — Telephone Encounter (Signed)
Mychart message sent to patient.

## 2016-09-20 ENCOUNTER — Encounter (HOSPITAL_COMMUNITY): Payer: Self-pay

## 2016-09-20 ENCOUNTER — Encounter (HOSPITAL_COMMUNITY)
Admission: RE | Admit: 2016-09-20 | Discharge: 2016-09-20 | Disposition: A | Discharge status: Home / Self Care | Payer: Medicare Other | Source: Ambulatory Visit | Attending: Cardiology | Admitting: Cardiology

## 2016-09-20 VITALS — BP 140/80 | HR 57 | Ht 69.0 in | Wt 209.0 lb

## 2016-09-20 DIAGNOSIS — Z955 Presence of coronary angioplasty implant and graft: Secondary | ICD-10-CM | POA: Diagnosis not present

## 2016-09-20 NOTE — Progress Notes (Signed)
Cardiac Rehab Medication Review by a Pharmacist  Does the patient  feel that his/her medications are working for him/her?  yes  Has the patient been experiencing any side effects to the medications prescribed?  no  Does the patient measure his/her own blood pressure or blood glucose at home?  yes   Does the patient have any problems obtaining medications due to transportation or finances?   no  Understanding of regimen: good Understanding of indications: good Potential of compliance: good   Pharmacist comments: Patient reports no medication issues and endorses adherence. He was told by his physician to stop propanolol 40 mg daily on 7/20 and start metoprolol 100 mg XL daily in its place. He is still taking propanolol, wanting to use up his remaining pills, but says he will initiate the switch today. Patient monitors his blood pressure twice daily at home which usually runs at 125/60-70s mmHg.    Leroy Libman, PharmD Pharmacy Resident Pager: [phone] 09/20/2016 9:19 AM

## 2016-09-20 NOTE — Progress Notes (Signed)
Cardiac Individual Treatment Plan  Patient Details  Name: [name] MRN: [id number] Date of Birth: 1935/04/13 Referring Provider:     CARDIAC REHAB PHASE II ORIENTATION from 09/20/2016 in [place]  Referring Provider  Adrian Prows, MD.      Initial Encounter Date:    CARDIAC REHAB PHASE II ORIENTATION from 09/20/2016 in [place]  Date  09/20/16  Referring Provider  Adrian Prows, MD.      Visit Diagnosis: 08/09/16 Stented coronary artery  Patient's Home Medications on Admission:  Current Outpatient Prescriptions:  .  aspirin EC 81 MG tablet, Take 81 mg by mouth daily., Disp: , Rfl:  .  carbidopa-levodopa (SINEMET IR) 25-100 MG tablet, Take 1 tablet by mouth 3 (three) times daily., Disp: 270 tablet, Rfl: 0 .  cholecalciferol (VITAMIN D) 1000 units tablet, Take 1,000 Units by mouth daily., Disp: , Rfl:  .  clopidogrel (PLAVIX) 75 MG tablet, Take 75 mg by mouth daily., Disp: , Rfl:  .  losartan (COZAAR) 50 MG tablet, Take 50 mg by mouth daily., Disp: , Rfl:  .  Multiple Vitamins-Minerals (PRESERVISION AREDS) CAPS, Take 1 capsule by mouth 2 (two) times daily., Disp: , Rfl:  .  propranolol (INDERAL) 40 MG tablet, Take 40 mg by mouth 2 (two) times daily., Disp: , Rfl:  .  rosuvastatin (CRESTOR) 10 MG tablet, Take 10 mg by mouth at bedtime., Disp: , Rfl:  .  tamsulosin (FLOMAX) 0.4 MG CAPS capsule, Take 0.4 mg by mouth at bedtime., Disp: , Rfl:  .  triamterene-hydrochlorothiazide (MAXZIDE-25) 37.5-25 MG tablet, Take 1 tablet by mouth daily., Disp: , Rfl:  .  vitamin E 400 UNIT capsule, Take 400 Units by mouth daily., Disp: , Rfl:  .  metoprolol succinate (TOPROL-XL) 100 MG 24 hr tablet, Take 100 mg by mouth daily. Take with or immediately following a meal., Disp: , Rfl:   Past Medical History: Past Medical History:  Diagnosis Date  . Arthritis    "knees; lower back" (08/09/2016)  . Atrial fibrillation  ([place])    remote hx/notes 08/07/2016  . Episodes of trembling    "most of the time it's my left hand" (08/09/2016)  . High cholesterol   . History of kidney stones   . Hypertension   . Melanoma of shoulder ([place]) 2000s   "left"  . Mitral regurgitation and mitral stenosis    hx/notes 08/07/2016  . Moderate aortic stenosis    hx/notes 08/07/2016  . Prostate cancer ([place]) 2011   hx/notes 08/07/2016    Tobacco Use: History  Smoking Status  . Former Smoker  . Years: 0.20  . Quit date: 1958  Smokeless Tobacco  . Never Used    Comment: "smoked  2 months when I was 30; non since" (08/09/2016)    Labs: Recent Review Flowsheet Data    Labs for ITP Cardiac and Pulmonary Rehab Latest Ref Rng & Units 08/09/2016   HCO3 20.0 - 28.0 mmol/L 25.1   TCO2 0 - 100 mmol/L 26   O2SAT % 62.0      Capillary Blood Glucose: No results found for: GLUCAP   Exercise Target Goals: Date: 09/20/16  Exercise Program Goal: Individual exercise prescription set with THRR, safety & activity barriers. Participant demonstrates ability to understand and report RPE using BORG scale, to self-measure pulse accurately, and to acknowledge the importance of the exercise prescription.  Exercise Prescription Goal: Starting with aerobic activity 30 plus minutes a day,  3 days per week for initial exercise prescription. Provide home exercise prescription and guidelines that participant acknowledges understanding prior to discharge.  Activity Barriers & Risk Stratification:     Activity Barriers & Cardiac Risk Stratification - 09/20/16 1149      Activity Barriers & Cardiac Risk Stratification   Activity Barriers Arthritis;Other (comment)   Comments Parkinson's disease.   Cardiac Risk Stratification Moderate      6 Minute Walk:     6 Minute Walk    Row Name 09/20/16 1148         6 Minute Walk   Phase Initial     Distance 1206 feet     Walk Time 6 minutes     # of Rest Breaks 0     MPH 2.28     METS 1.96      RPE 13     VO2 Peak 6.85     Symptoms Yes (comment)     Comments Patient used wheel chair after 2 minutes due to knee pain.     Resting HR 57 bpm     Resting BP 140/80     Max Ex. HR 87 bpm     Max Ex. BP 124/62     2 Minute Post BP 118/68        Oxygen Initial Assessment:   Oxygen Re-Evaluation:   Oxygen Discharge (Final Oxygen Re-Evaluation):   Initial Exercise Prescription:     Initial Exercise Prescription - 09/20/16 1100      Date of Initial Exercise RX and Referring Provider   Date 09/20/16   Referring Provider Adrian Prows, MD.     Bike   Level 0.5   Minutes 10   METs 2     NuStep   Level 2   SPM 85   Minutes 10   METs 2.2     Track   Laps 7   Minutes 10   METs 2.23     Prescription Details   Frequency (times per week) 3   Duration Progress to 30 minutes of continuous aerobic without signs/symptoms of physical distress     Intensity   THRR 40-80% of Max Heartrate 57-114   Ratings of Perceived Exertion 11-13   Perceived Dyspnea 0-4     Progression   Progression Continue to progress workloads to maintain intensity without signs/symptoms of physical distress.     Resistance Training   Training Prescription Yes   Weight 2lb wts   Reps 10-15      Perform Capillary Blood Glucose checks as needed.  Exercise Prescription Changes:   Exercise Comments:   Exercise Goals and Review:     Exercise Goals    Row Name 09/20/16 1151             Exercise Goals   Increase Physical Activity Yes       Intervention Provide advice, education, support and counseling about physical activity/exercise needs.;Develop an individualized exercise prescription for aerobic and resistive training based on initial evaluation findings, risk stratification, comorbidities and participant's personal goals.       Expected Outcomes Achievement of increased cardiorespiratory fitness and enhanced flexibility, muscular endurance and strength shown through  measurements of functional capacity and personal statement of participant.          Exercise Goals Re-Evaluation :    Discharge Exercise Prescription (Final Exercise Prescription Changes):   Nutrition:  Target Goals: Understanding of nutrition guidelines, daily intake of sodium 1500mg , cholesterol 200mg , calories 30% from fat  and 7% or less from saturated fats, daily to have 5 or more servings of fruits and vegetables.  Biometrics:     Pre Biometrics - 09/20/16 1125      Pre Biometrics   Height 5\' 9"  (1.753 m)   Waist Circumference 42.75 inches   Hip Circumference 42 inches   Waist to Hip Ratio 1.02 %   BMI (Calculated) 30.9   Triceps Skinfold 21 mm   % Body Fat 31.6 %   Grip Strength 33.5 kg   Flexibility 9 in   Single Leg Stand 2.14 seconds       Nutrition Therapy Plan and Nutrition Goals:   Nutrition Discharge: Nutrition Scores:   Nutrition Goals Re-Evaluation:   Nutrition Goals Re-Evaluation:   Nutrition Goals Discharge (Final Nutrition Goals Re-Evaluation):   Psychosocial: Target Goals: Acknowledge presence or absence of significant depression and/or stress, maximize coping skills, provide positive support system. Participant is able to verbalize types and ability to use techniques and skills needed for reducing stress and depression.  Initial Review & Psychosocial Screening:     Initial Psych Review & Screening - 09/20/16 1109      Initial Review   Current issues with --  health related anxiety   Source of Stress Concerns Chronic Illness;Unable to participate in former interests or hobbies;Unable to perform yard/household activities   Comments upon brief assessment, pt exhibits health related anxiety, otherwise no psychosocial needs identified, no interventions necessary      [place]? Yes  partner, Tamsen Snider     Barriers   Psychosocial barriers to participate in program The patient should benefit from training  in stress management and relaxation.     Screening Interventions   Interventions Encouraged to exercise;Provide feedback about the scores to participant      Quality of Life Scores:     Quality of Life - 09/20/16 1032      Quality of Life Scores   Health/Function Pre 14.4 %   Socioeconomic Pre 24 %   Psych/Spiritual Pre 27.6 %   Family Pre 30 %   GLOBAL Pre 20.4 %      PHQ-9: Recent Review Flowsheet Data    There is no flowsheet data to display.     Interpretation of Total Score  Total Score Depression Severity:  1-4 = Minimal depression, 5-9 = Mild depression, 10-14 = Moderate depression, 15-19 = Moderately severe depression, 20-27 = Severe depression   Psychosocial Evaluation and Intervention:   Psychosocial Re-Evaluation:   Psychosocial Discharge (Final Psychosocial Re-Evaluation):   Vocational Rehabilitation: Provide vocational rehab assistance to qualifying candidates.   Vocational Rehab Evaluation & Intervention:     Vocational Rehab - 09/20/16 1101      Initial Vocational Rehab Evaluation & Intervention   Assessment shows need for Vocational Rehabilitation No      Education: Education Goals: Education classes will be provided on a weekly basis, covering required topics. Participant will state understanding/return demonstration of topics presented.  Learning Barriers/Preferences:     Learning Barriers/Preferences - 09/20/16 1221      Learning Barriers/Preferences   Learning Barriers Sight   Learning Preferences None      Education Topics: Count Your Pulse:  -Group instruction provided by verbal instruction, demonstration, patient participation and written materials to support subject.  Instructors address importance of being able to find your pulse and how to count your pulse when at home without a heart monitor.  Patients get hands on experience counting  their pulse with staff help and individually.   Heart Attack, Angina, and Risk Factor  Modification:  -Group instruction provided by verbal instruction, video, and written materials to support subject.  Instructors address signs and symptoms of angina and heart attacks.    Also discuss risk factors for heart disease and how to make changes to improve heart health risk factors.   Functional Fitness:  -Group instruction provided by verbal instruction, demonstration, patient participation, and written materials to support subject.  Instructors address safety measures for doing things around the house.  Discuss how to get up and down off the floor, how to pick things up properly, how to safely get out of a chair without assistance, and balance training.   Meditation and Mindfulness:  -Group instruction provided by verbal instruction, patient participation, and written materials to support subject.  Instructor addresses importance of mindfulness and meditation practice to help reduce stress and improve awareness.  Instructor also leads participants through a meditation exercise.    Stretching for Flexibility and Mobility:  -Group instruction provided by verbal instruction, patient participation, and written materials to support subject.  Instructors lead participants through series of stretches that are designed to increase flexibility thus improving mobility.  These stretches are additional exercise for major muscle groups that are typically performed during regular warm up and cool down.   Hands Only CPR:  -Group verbal, video, and participation provides a basic overview of AHA guidelines for community CPR. Role-play of emergencies allow participants the opportunity to practice calling for help and chest compression technique with discussion of AED use.   Hypertension: -Group verbal and written instruction that provides a basic overview of hypertension including the most recent diagnostic guidelines, risk factor reduction with self-care instructions and medication  management.    Nutrition I class: Heart Healthy Eating:  -Group instruction provided by PowerPoint slides, verbal discussion, and written materials to support subject matter. The instructor gives an explanation and review of the Therapeutic Lifestyle Changes diet recommendations, which includes a discussion on lipid goals, dietary fat, sodium, fiber, plant stanol/sterol esters, sugar, and the components of a well-balanced, healthy diet.   Nutrition II class: Lifestyle Skills:  -Group instruction provided by PowerPoint slides, verbal discussion, and written materials to support subject matter. The instructor gives an explanation and review of label reading, grocery shopping for heart health, heart healthy recipe modifications, and ways to make healthier choices when eating out.   Diabetes Question & Answer:  -Group instruction provided by PowerPoint slides, verbal discussion, and written materials to support subject matter. The instructor gives an explanation and review of diabetes co-morbidities, pre- and post-prandial blood glucose goals, pre-exercise blood glucose goals, signs, symptoms, and treatment of hypoglycemia and hyperglycemia, and foot care basics.   Diabetes Blitz:  -Group instruction provided by PowerPoint slides, verbal discussion, and written materials to support subject matter. The instructor gives an explanation and review of the physiology behind type 1 and type 2 diabetes, diabetes medications and rational behind using different medications, pre- and post-prandial blood glucose recommendations and Hemoglobin A1c goals, diabetes diet, and exercise including blood glucose guidelines for exercising safely.    Portion Distortion:  -Group instruction provided by PowerPoint slides, verbal discussion, written materials, and food models to support subject matter. The instructor gives an explanation of serving size versus portion size, changes in portions sizes over the last 20 years,  and what consists of a serving from each food group.   Stress Management:  -Group instruction provided by verbal  instruction, video, and written materials to support subject matter.  Instructors review role of stress in heart disease and how to cope with stress positively.     Exercising on Your Own:  -Group instruction provided by verbal instruction, power point, and written materials to support subject.  Instructors discuss benefits of exercise, components of exercise, frequency and intensity of exercise, and end points for exercise.  Also discuss use of nitroglycerin and activating EMS.  Review options of places to exercise outside of rehab.  Review guidelines for sex with heart disease.   Cardiac Drugs I:  -Group instruction provided by verbal instruction and written materials to support subject.  Instructor reviews cardiac drug classes: antiplatelets, anticoagulants, beta blockers, and statins.  Instructor discusses reasons, side effects, and lifestyle considerations for each drug class.   Cardiac Drugs II:  -Group instruction provided by verbal instruction and written materials to support subject.  Instructor reviews cardiac drug classes: angiotensin converting enzyme inhibitors (ACE-I), angiotensin II receptor blockers (ARBs), nitrates, and calcium channel blockers.  Instructor discusses reasons, side effects, and lifestyle considerations for each drug class.   Anatomy and Physiology of the Circulatory System:  Group verbal and written instruction and models provide basic cardiac anatomy and physiology, with the coronary electrical and arterial systems. Review of: AMI, Angina, Valve disease, Heart Failure, Peripheral Artery Disease, Cardiac Arrhythmia, Pacemakers, and the ICD.   Other Education:  -Group or individual verbal, written, or video instructions that support the educational goals of the cardiac rehab program.   Knowledge Questionnaire Score:     Knowledge Questionnaire  Score - 09/20/16 1032      Knowledge Questionnaire Score   Pre Score 20/24      Core Components/Risk Factors/Patient Goals at Admission:     Personal Goals and Risk Factors at Admission - 09/20/16 1055      Core Components/Risk Factors/Patient Goals on Admission    Weight Management Yes;Weight Maintenance;Obesity;Weight Loss   Intervention Weight Management: Provide education and appropriate resources to help participant work on and attain dietary goals.;Weight Management: Develop a combined nutrition and exercise program designed to reach desired caloric intake, while maintaining appropriate intake of nutrient and fiber, sodium and fats, and appropriate energy expenditure required for the weight goal.;Weight Management/Obesity: Establish reasonable short term and long term weight goals.;Obesity: Provide education and appropriate resources to help participant work on and attain dietary goals.   Expected Outcomes Short Term: Continue to assess and modify interventions until short term weight is achieved;Long Term: Adherence to nutrition and physical activity/exercise program aimed toward attainment of established weight goal;Weight Maintenance: Understanding of the daily nutrition guidelines, which includes 25-35% calories from fat, 7% or less cal from saturated fats, less than 200mg  cholesterol, less than 1.5gm of sodium, & 5 or more servings of fruits and vegetables daily;Weight Loss: Understanding of general recommendations for a balanced deficit meal plan, which promotes 1-2 lb weight loss per week and includes a negative energy balance of [phone] kcal/d;Understanding recommendations for meals to include 15-35% energy as protein, 25-35% energy from fat, 35-60% energy from carbohydrates, less than 200mg  of dietary cholesterol, 20-35 gm of total fiber daily;Understanding of distribution of calorie intake throughout the day with the consumption of 4-5 meals/snacks   Hypertension Yes   Intervention  Provide education on lifestyle modifcations including regular physical activity/exercise, weight management, moderate sodium restriction and increased consumption of fresh fruit, vegetables, and low fat dairy, alcohol moderation, and smoking cessation.;Monitor prescription use compliance.   Expected Outcomes Short Term: Continued  assessment and intervention until BP is < 140/50mm HG in hypertensive participants. < 130/32mm HG in hypertensive participants with diabetes, heart failure or chronic kidney disease.;Long Term: Maintenance of blood pressure at goal levels.   Lipids Yes   Intervention Provide education and support for participant on nutrition & aerobic/resistive exercise along with prescribed medications to achieve LDL 70mg , HDL >40mg .   Expected Outcomes Short Term: Participant states understanding of desired cholesterol values and is compliant with medications prescribed. Participant is following exercise prescription and nutrition guidelines.;Long Term: Cholesterol controlled with medications as prescribed, with individualized exercise RX and with personalized nutrition plan. Value goals: LDL < 70mg , HDL > 40 mg.   Stress Yes   Personal Goal Other Yes   Personal Goal Be able to walk better. Modify risk factors for heart disease.   Intervention Develop individualized exercise prescription including walking for home to improve walking capacity and distanced as measured by 6 minute walk test. Provide education information regarding risk factor modification including heart healhty eating and home exercise program.   Expected Outcomes Participant states understanding of what risk factors are modifiable. Participant follows heart healhty diet and follows exercise prescription guidelines.      Core Components/Risk Factors/Patient Goals Review:    Core Components/Risk Factors/Patient Goals at Discharge (Final Review):    ITP Comments:     ITP Comments    Row Name 09/20/16 1054            ITP Comments DR Fransico Him, MEDICAL DIRECTOR           Comments: Patient attended orientation from [place]  to [phone]  to review rules and guidelines for program. Completed 6 minute walk test, Intitial ITP, and exercise prescription.  VSS. Telemetry-sinus rhythm,  Asymptomatic.

## 2016-09-26 ENCOUNTER — Encounter (HOSPITAL_COMMUNITY)
Admission: RE | Admit: 2016-09-26 | Discharge: 2016-09-26 | Disposition: A | Discharge status: Home / Self Care | Payer: Medicare Other | Source: Ambulatory Visit | Attending: Cardiology | Admitting: Cardiology

## 2016-09-26 DIAGNOSIS — Z955 Presence of coronary angioplasty implant and graft: Secondary | ICD-10-CM | POA: Diagnosis not present

## 2016-09-26 NOTE — Progress Notes (Signed)
Daily Session Note  Patient Details  Name: [name] [facility]) Joshuan Bolander MRN: [id number] Date of Birth: [birth year] Referring Provider:     CARDIAC REHAB PHASE II ORIENTATION from 09/20/2016 in [place]  Referring Provider  Adrian Prows, MD.      Encounter Date: 09/26/2016  Check In:     Session Check In - 09/26/16 1546      Check-In   Location [facility]   Staff Present Cleda Mccreedy, MS, Exercise Physiologist;Amber Fair, MS, ACSM RCEP, Exercise Physiologist;Joann Rion, RN, Marga Melnick, RN, BSN   Supervising physician immediately available to respond to emergencies [facility] immediately available   Physician(s) Dr. Eliseo Squires   Medication changes reported     No   Fall or balance concerns reported    No   Tobacco Cessation No Change   Warm-up and Cool-down Performed as group-led instruction   Resistance Training Performed Yes   VAD Patient? No     Pain Assessment   Currently in Pain? No/denies   Multiple Pain Sites No      Capillary Blood Glucose: No results found for this or any previous visit (from the past 24 hour(s)).    History  Smoking Status  . Former Smoker  . Years: 0.20  . Quit date: 1958  Smokeless Tobacco  . Never Used    Comment: "smoked  2 months when I was 50; non since" (08/09/2016)    Goals Met:  No report of cardiac concerns or symptoms  Goals Unmet:  Not Applicable  Comments: [name] started cardiac rehab today.  Pt tolerated light exercise without difficulty. VSS, telemetry-Sinus rhythm, asymptomatic.  Medication list reconciled. Pt denies barriers to medicaiton compliance.  PSYCHOSOCIAL ASSESSMENT:  PHQ-0. Pt exhibits positive coping skills, hopeful outlook with supportive family. No psychosocial needs identified at this time, no psychosocial interventions necessary.    Pt enjoys cooking and woodworking.   Pt oriented to exercise equipment and routine.    Understanding verbalized. Barnet Pall, RN,BSN 09/26/2016 4:32 PM   Dr. Fransico Him is Medical Director for Cardiac Rehab at [facility].

## 2016-09-28 ENCOUNTER — Encounter (HOSPITAL_COMMUNITY)
Admission: RE | Admit: 2016-09-28 | Discharge: 2016-09-28 | Disposition: A | Discharge status: Home / Self Care | Payer: Medicare Other | Source: Ambulatory Visit | Attending: Cardiology | Admitting: Cardiology

## 2016-09-28 DIAGNOSIS — Z955 Presence of coronary angioplasty implant and graft: Secondary | ICD-10-CM | POA: Diagnosis not present

## 2016-09-28 NOTE — Progress Notes (Signed)
[name] 81 y.o. male DOB 06-09-1935 MRN [id number]       Nutrition Screen & Note  Dx: s/p DES/Arthrectomy LAD  Past Medical History:  Diagnosis Date  . Arthritis    "knees; lower back" (08/09/2016)  . Atrial fibrillation ([place])    remote hx/notes 08/07/2016  . Episodes of trembling    "most of the time it's my left hand" (08/09/2016)  . High cholesterol   . History of kidney stones   . Hypertension   . Melanoma of shoulder ([place]) 2000s   "left"  . Mitral regurgitation and mitral stenosis    hx/notes 08/07/2016  . Moderate aortic stenosis    hx/notes 08/07/2016  . Prostate cancer ([place]) 2011   hx/notes 08/07/2016   Meds reviewed.   HT: Ht Readings from Last 1 Encounters:  09/20/16 5\' 9"  (1.753 m)    WT: Wt Readings from Last 3 Encounters:  09/20/16 208 lb 15.9 oz (94.8 kg)  09/08/16 209 lb (94.8 kg)  08/09/16 202 lb (91.6 kg)     BMI 30.9   Current tobacco use? No  Labs:  Lipid Panel  No results found for: CHOL, TRIG, HDL, CHOLHDL, VLDL, LDLCALC, LDLDIRECT  No results found for: HGBA1C CBG (last 3)  No results for input(s): GLUCAP in the last 72 hours.  Nutrition Diagnosis ? Food-and nutrition-related knowledge deficit related to lack of exposure to information as related to diagnosis of: ? CVD  ? Obesity related to excessive energy intake as evidenced by a BMI of 30.9  Nutrition Goal(s):  ? Pt to identify and limit food sources of saturated fat, trans fat, and sodium  Plan:  Pt to attend nutrition classes ? Nutrition I ? Nutrition II ? Portion Distortion  Will provide client-centered nutrition education as part of interdisciplinary care.   Monitor and evaluate progress toward nutrition goal with team.  Derek Mound, M.Ed, RD, LDN, CDE 09/28/2016 10:14 AM

## 2016-09-30 ENCOUNTER — Encounter (HOSPITAL_COMMUNITY)
Admission: RE | Admit: 2016-09-30 | Discharge: 2016-09-30 | Disposition: A | Discharge status: Home / Self Care | Payer: Medicare Other | Source: Ambulatory Visit | Attending: Cardiology | Admitting: Cardiology

## 2016-09-30 DIAGNOSIS — Z955 Presence of coronary angioplasty implant and graft: Secondary | ICD-10-CM | POA: Diagnosis not present

## 2016-10-03 ENCOUNTER — Encounter (HOSPITAL_COMMUNITY)
Admission: RE | Admit: 2016-10-03 | Discharge: 2016-10-03 | Disposition: A | Discharge status: Home / Self Care | Payer: Medicare Other | Source: Ambulatory Visit | Attending: Cardiology | Admitting: Cardiology

## 2016-10-03 DIAGNOSIS — Z955 Presence of coronary angioplasty implant and graft: Secondary | ICD-10-CM

## 2016-10-05 ENCOUNTER — Encounter (HOSPITAL_COMMUNITY)
Admission: RE | Admit: 2016-10-05 | Discharge: 2016-10-05 | Disposition: A | Discharge status: Home / Self Care | Payer: Medicare Other | Source: Ambulatory Visit | Attending: Cardiology | Admitting: Cardiology

## 2016-10-05 DIAGNOSIS — Z955 Presence of coronary angioplasty implant and graft: Secondary | ICD-10-CM

## 2016-10-07 ENCOUNTER — Encounter (HOSPITAL_COMMUNITY)
Admission: RE | Admit: 2016-10-07 | Discharge: 2016-10-07 | Disposition: A | Discharge status: Home / Self Care | Payer: Medicare Other | Source: Ambulatory Visit | Attending: Cardiology | Admitting: Cardiology

## 2016-10-07 DIAGNOSIS — Z955 Presence of coronary angioplasty implant and graft: Secondary | ICD-10-CM

## 2016-10-10 ENCOUNTER — Encounter (HOSPITAL_COMMUNITY)
Admission: RE | Admit: 2016-10-10 | Discharge: 2016-10-10 | Disposition: A | Discharge status: Home / Self Care | Payer: Medicare Other | Source: Ambulatory Visit | Attending: Cardiology | Admitting: Cardiology

## 2016-10-10 DIAGNOSIS — Z955 Presence of coronary angioplasty implant and graft: Secondary | ICD-10-CM

## 2016-10-10 NOTE — Progress Notes (Signed)
Incomplete Session Note  Patient Details  Name: [name] [facility]) [name] MRN: [id number] Date of Birth: Jun 17, 1935 Referring Provider:     CARDIAC REHAB PHASE II ORIENTATION from 09/20/2016 in [place]  Referring Provider  [name] Prows, MD.      [name] did not complete his rehab session.  [name] was counseled by the dietitian and did not exercise today.[name] plans to return to exercise on Wednesday.Barnet Pall, RN,BSN 10/10/2016 4:41 PM

## 2016-10-10 NOTE — Progress Notes (Signed)
[name] 81 y.o. male DOB [birth year] MRN [id number]       Nutrition Note  Dx: s/p DES/Arthrectomy LAD  Note Spoke with pt and pt's wife. Pt is following Step 1 of the T[facility] diet. There are some ways the pt can make his eating habits healthier, which were discussed. 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 Pt expressed understanding of the information discussed via feedback method.  Nutrition Diagnosis ? Food-and nutrition-related knowledge deficit related to lack of exposure to information as related to diagnosis of: ? CVD  ? Obesity related to excessive energy intake as evidenced by a BMI of 30.9  Nutrition Goal(s):  ? Pt to identify and limit food sources of saturated fat, trans fat, and sodium  Nutrition Intervention ? Nutrition Plan discussed. ? Benefits of adopting Therapeutic Lifestyle Changes discussed when Medficts reviewed. ? Pt to attend the Portion Distortion class ? Pt given handouts for: ? Nutrition II class  Plan:  Pt to attend nutrition classes ? Nutrition I - met 10/04/16 ? Nutrition II ? Portion Distortion  Will provide client-centered nutrition education as part of interdisciplinary care.   Monitor and evaluate progress toward nutrition goal with team.  Derek Mound, M.Ed, RD, LDN, CDE 10/10/2016 3:37 PM

## 2016-10-12 ENCOUNTER — Encounter (HOSPITAL_COMMUNITY)
Admission: RE | Admit: 2016-10-12 | Discharge: 2016-10-12 | Disposition: A | Discharge status: Home / Self Care | Payer: Medicare Other | Source: Ambulatory Visit | Attending: Cardiology | Admitting: Cardiology

## 2016-10-12 DIAGNOSIS — Z955 Presence of coronary angioplasty implant and graft: Secondary | ICD-10-CM

## 2016-10-13 NOTE — Progress Notes (Signed)
Cardiac Individual Treatment Plan  Patient Details  Name: [name]) Haruki Arnold MRN: [id number] Date of Birth: 08-14-1935 Referring Provider:     CARDIAC REHAB PHASE II ORIENTATION from 09/20/2016 in [place]  Referring Provider  Adrian Prows, MD.      Initial Encounter Date:    CARDIAC REHAB PHASE II ORIENTATION from 09/20/2016 in [place]  Date  09/20/16  Referring Provider  Adrian Prows, MD.      Visit Diagnosis: 08/09/16 Stented coronary artery  Patient's Home Medications on Admission:  Current Outpatient Prescriptions:  .  aspirin EC 81 MG tablet, Take 81 mg by mouth daily., Disp: , Rfl:  .  carbidopa-levodopa (SINEMET IR) 25-100 MG tablet, Take 1 tablet by mouth 3 (three) times daily., Disp: 270 tablet, Rfl: 0 .  cholecalciferol (VITAMIN D) 1000 units tablet, Take 1,000 Units by mouth daily., Disp: , Rfl:  .  clopidogrel (PLAVIX) 75 MG tablet, Take 75 mg by mouth daily., Disp: , Rfl:  .  losartan (COZAAR) 50 MG tablet, Take 50 mg by mouth daily., Disp: , Rfl:  .  metoprolol succinate (TOPROL-XL) 100 MG 24 hr tablet, Take 100 mg by mouth daily. Take with or immediately following a meal., Disp: , Rfl:  .  Multiple Vitamins-Minerals (PRESERVISION AREDS) CAPS, Take 1 capsule by mouth 2 (two) times daily., Disp: , Rfl:  .  rosuvastatin (CRESTOR) 10 MG tablet, Take 10 mg by mouth at bedtime., Disp: , Rfl:  .  tamsulosin (FLOMAX) 0.4 MG CAPS capsule, Take 0.4 mg by mouth at bedtime., Disp: , Rfl:  .  triamterene-hydrochlorothiazide (MAXZIDE-25) 37.5-25 MG tablet, Take 1 tablet by mouth daily., Disp: , Rfl:  .  vitamin E 400 UNIT capsule, Take 400 Units by mouth daily., Disp: , Rfl:   Past Medical History: Past Medical History:  Diagnosis Date  . Arthritis    "knees; lower back" (08/09/2016)  . Atrial fibrillation ([place])    remote hx/notes 08/07/2016  . Episodes of trembling    "most of the time it's my left  hand" (08/09/2016)  . High cholesterol   . History of kidney stones   . Hypertension   . Melanoma of shoulder ([place]) 2000s   "left"  . Mitral regurgitation and mitral stenosis    hx/notes 08/07/2016  . Moderate aortic stenosis    hx/notes 08/07/2016  . Prostate cancer ([place]) 2011   hx/notes 08/07/2016    Tobacco Use: History  Smoking Status  . Former Smoker  . Years: 0.20  . Quit date: 1958  Smokeless Tobacco  . Never Used    Comment: "smoked  2 months when I was 89; non since" (08/09/2016)    Labs: Recent Review Flowsheet Data    Labs for ITP Cardiac and Pulmonary Rehab Latest Ref Rng & Units 08/09/2016   HCO3 20.0 - 28.0 mmol/L 25.1   TCO2 0 - 100 mmol/L 26   O2SAT % 62.0      Capillary Blood Glucose: No results found for: GLUCAP   Exercise Target Goals:    Exercise Program Goal: Individual exercise prescription set with THRR, safety & activity barriers. Participant demonstrates ability to understand and report RPE using BORG scale, to self-measure pulse accurately, and to acknowledge the importance of the exercise prescription.  Exercise Prescription Goal: Starting with aerobic activity 30 plus minutes a day, 3 days per week for initial exercise prescription. Provide home exercise prescription and guidelines that participant acknowledges understanding prior to  discharge.  Activity Barriers & Risk Stratification:     Activity Barriers & Cardiac Risk Stratification - 09/20/16 1149      Activity Barriers & Cardiac Risk Stratification   Activity Barriers Arthritis;Other (comment)   Comments Parkinson's disease.   Cardiac Risk Stratification Moderate      6 Minute Walk:     6 Minute Walk    Row Name 09/20/16 1148         6 Minute Walk   Phase Initial     Distance 1206 feet     Walk Time 6 minutes     # of Rest Breaks 0     MPH 2.28     METS 1.96     RPE 13     VO2 Peak 6.85     Symptoms Yes (comment)     Comments Patient used wheel chair after 2  minutes due to knee pain.     Resting HR 57 bpm     Resting BP 140/80     Max Ex. HR 87 bpm     Max Ex. BP 124/62     2 Minute Post BP 118/68        Oxygen Initial Assessment:   Oxygen Re-Evaluation:   Oxygen Discharge (Final Oxygen Re-Evaluation):   Initial Exercise Prescription:     Initial Exercise Prescription - 09/20/16 1100      Date of Initial Exercise RX and Referring Provider   Date 09/20/16   Referring Provider Adrian Prows, MD.     Bike   Level 0.5   Minutes 10   METs 2     NuStep   Level 2   SPM 85   Minutes 10   METs 2.2     Track   Laps 7   Minutes 10   METs 2.23     Prescription Details   Frequency (times per week) 3   Duration Progress to 30 minutes of continuous aerobic without signs/symptoms of physical distress     Intensity   THRR 40-80% of Max Heartrate 57-114   Ratings of Perceived Exertion 11-13   Perceived Dyspnea 0-4     Progression   Progression Continue to progress workloads to maintain intensity without signs/symptoms of physical distress.     Resistance Training   Training Prescription Yes   Weight 2lb wts   Reps 10-15      Perform Capillary Blood Glucose checks as needed.  Exercise Prescription Changes:     Exercise Prescription Changes    Row Name 10/10/16 1600             Response to Exercise   Blood Pressure (Admit) 124/80       Blood Pressure (Exercise) 128/82       Blood Pressure (Exit) 128/70       Heart Rate (Admit) 66 bpm       Heart Rate (Exercise) 78 bpm       Heart Rate (Exit) 62 bpm       Rating of Perceived Exertion (Exercise) 11       Duration Progress to 45 minutes of aerobic exercise without signs/symptoms of physical distress       Intensity THRR unchanged         Progression   Progression Continue to progress workloads to maintain intensity without signs/symptoms of physical distress.       Average METs 1.9         Resistance Training   Training Prescription Yes  Weight 2lb  wts       Reps 10-15         Bike   Level 0.5       Minutes 10       METs 2         NuStep   Level 2       SPM 85       Minutes 20       METs 1.8          Exercise Comments:     Exercise Comments    Row Name 10/10/16 1617           Exercise Comments Pt is off to a great start with exercise!          Exercise Goals and Review:     Exercise Goals    Row Name 09/20/16 1151             Exercise Goals   Increase Physical Activity Yes       Intervention Provide advice, education, support and counseling about physical activity/exercise needs.;Develop an individualized exercise prescription for aerobic and resistive training based on initial evaluation findings, risk stratification, comorbidities and participant's personal goals.       Expected Outcomes Achievement of increased cardiorespiratory fitness and enhanced flexibility, muscular endurance and strength shown through measurements of functional capacity and personal statement of participant.          Exercise Goals Re-Evaluation :    Discharge Exercise Prescription (Final Exercise Prescription Changes):     Exercise Prescription Changes - 10/10/16 1600      Response to Exercise   Blood Pressure (Admit) 124/80   Blood Pressure (Exercise) 128/82   Blood Pressure (Exit) 128/70   Heart Rate (Admit) 66 bpm   Heart Rate (Exercise) 78 bpm   Heart Rate (Exit) 62 bpm   Rating of Perceived Exertion (Exercise) 11   Duration Progress to 45 minutes of aerobic exercise without signs/symptoms of physical distress   Intensity THRR unchanged     Progression   Progression Continue to progress workloads to maintain intensity without signs/symptoms of physical distress.   Average METs 1.9     Resistance Training   Training Prescription Yes   Weight 2lb wts   Reps 10-15     Bike   Level 0.5   Minutes 10   METs 2     NuStep   Level 2   SPM 85   Minutes 20   METs 1.8      Nutrition:  Target Goals:  Understanding of nutrition guidelines, daily intake of sodium 1500mg , cholesterol 200mg , calories 30% from fat and 7% or less from saturated fats, daily to have 5 or more servings of fruits and vegetables.  Biometrics:     Pre Biometrics - 09/20/16 1125      Pre Biometrics   Height 5\' 9"  (1.753 m)   Waist Circumference 42.75 inches   Hip Circumference 42 inches   Waist to Hip Ratio 1.02 %   BMI (Calculated) 30.9   Triceps Skinfold 21 mm   % Body Fat 31.6 %   Grip Strength 33.5 kg   Flexibility 9 in   Single Leg Stand 2.14 seconds       Nutrition Therapy Plan and Nutrition Goals:     Nutrition Therapy & Goals - 09/28/16 1013      Nutrition Therapy   Diet Therapeutic Lifestyle Changes     Personal Nutrition Goals   Nutrition Goal  Pt to identify and limit food sources of saturated fat, trans fat, and sodium     Intervention Plan   Intervention Prescribe, educate and counsel regarding individualized specific dietary modifications aiming towards targeted core components such as weight, hypertension, lipid management, diabetes, heart failure and other comorbidities.   Expected Outcomes Short Term Goal: Understand basic principles of dietary content, such as calories, fat, sodium, cholesterol and nutrients.;Long Term Goal: Adherence to prescribed nutrition plan.      Nutrition Discharge: Nutrition Scores:     Nutrition Assessments - 09/28/16 1012      MEDFICTS Scores   Pre Score 53      Nutrition Goals Re-Evaluation:   Nutrition Goals Re-Evaluation:   Nutrition Goals Discharge (Final Nutrition Goals Re-Evaluation):   Psychosocial: Target Goals: Acknowledge presence or absence of significant depression and/or stress, maximize coping skills, provide positive support system. Participant is able to verbalize types and ability to use techniques and skills needed for reducing stress and depression.  Initial Review & Psychosocial Screening:     Initial Psych Review  & Screening - 09/20/16 1109      Initial Review   Current issues with --  health related anxiety   Source of Stress Concerns Chronic Illness;Unable to participate in former interests or hobbies;Unable to perform yard/household activities   Comments upon brief assessment, pt exhibits health related anxiety, otherwise no psychosocial needs identified, no interventions necessary      [place]? Yes  partner, Tamsen Snider     Barriers   Psychosocial barriers to participate in program The patient should benefit from training in stress management and relaxation.     Screening Interventions   Interventions Encouraged to exercise;Provide feedback about the scores to participant      Quality of Life Scores:     Quality of Life - 09/20/16 1032      Quality of Life Scores   Health/Function Pre 14.4 %   Socioeconomic Pre 24 %   Psych/Spiritual Pre 27.6 %   Family Pre 30 %   GLOBAL Pre 20.4 %      PHQ-9: Recent Review Flowsheet Data    Depression screen [facility] 2/9 09/26/2016   Decreased Interest 0   Down, Depressed, Hopeless 0   PHQ - 2 Score 0     Interpretation of Total Score  Total Score Depression Severity:  1-4 = Minimal depression, 5-9 = Mild depression, 10-14 = Moderate depression, 15-19 = Moderately severe depression, 20-27 = Severe depression   Psychosocial Evaluation and Intervention:   Psychosocial Re-Evaluation:     Psychosocial Re-Evaluation    [place] Name 10/13/16 1131             Psychosocial Re-Evaluation   Current issues with None Identified       Interventions Encouraged to attend Cardiac Rehabilitation for the exercise       Continue Psychosocial Services  No Follow up required          Psychosocial Discharge (Final Psychosocial Re-Evaluation):     Psychosocial Re-Evaluation - 10/13/16 1131      Psychosocial Re-Evaluation   Current issues with None Identified   Interventions Encouraged to attend Cardiac Rehabilitation  for the exercise   Continue Psychosocial Services  No Follow up required      Vocational Rehabilitation: Provide vocational rehab assistance to qualifying candidates.   Vocational Rehab Evaluation & Intervention:     Vocational Rehab - 09/20/16 1101      Initial Vocational  Rehab Evaluation & Intervention   Assessment shows need for Vocational Rehabilitation No      Education: Education Goals: Education classes will be provided on a weekly basis, covering required topics. Participant will state understanding/return demonstration of topics presented.  Learning Barriers/Preferences:     Learning Barriers/Preferences - 09/20/16 1221      Learning Barriers/Preferences   Learning Barriers Sight   Learning Preferences None      Education Topics: Count Your Pulse:  -Group instruction provided by verbal instruction, demonstration, patient participation and written materials to support subject.  Instructors address importance of being able to find your pulse and how to count your pulse when at home without a heart monitor.  Patients get hands on experience counting their pulse with staff help and individually.   Heart Attack, Angina, and Risk Factor Modification:  -Group instruction provided by verbal instruction, video, and written materials to support subject.  Instructors address signs and symptoms of angina and heart attacks.    Also discuss risk factors for heart disease and how to make changes to improve heart health risk factors.   Functional Fitness:  -Group instruction provided by verbal instruction, demonstration, patient participation, and written materials to support subject.  Instructors address safety measures for doing things around the house.  Discuss how to get up and down off the floor, how to pick things up properly, how to safely get out of a chair without assistance, and balance training.   Meditation and Mindfulness:  -Group instruction provided by verbal  instruction, patient participation, and written materials to support subject.  Instructor addresses importance of mindfulness and meditation practice to help reduce stress and improve awareness.  Instructor also leads participants through a meditation exercise.    CARDIAC REHAB PHASE II EXERCISE from 10/12/2016 in [place]  Date  10/12/16  Instruction Review Code  2- meets goals/outcomes      Stretching for Flexibility and Mobility:  -Group instruction provided by verbal instruction, patient participation, and written materials to support subject.  Instructors lead participants through series of stretches that are designed to increase flexibility thus improving mobility.  These stretches are additional exercise for major muscle groups that are typically performed during regular warm up and cool down.   Hands Only CPR:  -Group verbal, video, and participation provides a basic overview of AHA guidelines for community CPR. Role-play of emergencies allow participants the opportunity to practice calling for help and chest compression technique with discussion of AED use.   Hypertension: -Group verbal and written instruction that provides a basic overview of hypertension including the most recent diagnostic guidelines, risk factor reduction with self-care instructions and medication management.   CARDIAC REHAB PHASE II EXERCISE from 10/12/2016 in [place]  Date  10/07/16  Instruction Review Code  2- meets goals/outcomes       Nutrition I class: Heart Healthy Eating:  -Group instruction provided by PowerPoint slides, verbal discussion, and written materials to support subject matter. The instructor gives an explanation and review of the Therapeutic Lifestyle Changes diet recommendations, which includes a discussion on lipid goals, dietary fat, sodium, fiber, plant stanol/sterol esters, sugar, and the components of a well-balanced,  healthy diet.   CARDIAC REHAB PHASE II EXERCISE from 10/12/2016 in [place]  Date  10/04/16  Educator  RD  Instruction Review Code  2- meets goals/outcomes      Nutrition II class: Lifestyle Skills:  -Group instruction provided  by PowerPoint slides, verbal discussion, and written materials to support subject matter. The instructor gives an explanation and review of label reading, grocery shopping for heart health, heart healthy recipe modifications, and ways to make healthier choices when eating out.   CARDIAC REHAB PHASE II EXERCISE from 10/12/2016 in [place]  Date  10/10/16  Educator  RD  Instruction Review Code  Not applicable      Diabetes Question & Answer:  -Group instruction provided by PowerPoint slides, verbal discussion, and written materials to support subject matter. The instructor gives an explanation and review of diabetes co-morbidities, pre- and post-prandial blood glucose goals, pre-exercise blood glucose goals, signs, symptoms, and treatment of hypoglycemia and hyperglycemia, and foot care basics.   Diabetes Blitz:  -Group instruction provided by PowerPoint slides, verbal discussion, and written materials to support subject matter. The instructor gives an explanation and review of the physiology behind type 1 and type 2 diabetes, diabetes medications and rational behind using different medications, pre- and post-prandial blood glucose recommendations and Hemoglobin A1c goals, diabetes diet, and exercise including blood glucose guidelines for exercising safely.    Portion Distortion:  -Group instruction provided by PowerPoint slides, verbal discussion, written materials, and food models to support subject matter. The instructor gives an explanation of serving size versus portion size, changes in portions sizes over the last 20 years, and what consists of a serving from each food group.   Stress  Management:  -Group instruction provided by verbal instruction, video, and written materials to support subject matter.  Instructors review role of stress in heart disease and how to cope with stress positively.     Exercising on Your Own:  -Group instruction provided by verbal instruction, power point, and written materials to support subject.  Instructors discuss benefits of exercise, components of exercise, frequency and intensity of exercise, and end points for exercise.  Also discuss use of nitroglycerin and activating EMS.  Review options of places to exercise outside of rehab.  Review guidelines for sex with heart disease.   Cardiac Drugs I:  -Group instruction provided by verbal instruction and written materials to support subject.  Instructor reviews cardiac drug classes: antiplatelets, anticoagulants, beta blockers, and statins.  Instructor discusses reasons, side effects, and lifestyle considerations for each drug class.   CARDIAC REHAB PHASE II EXERCISE from 10/05/2016 in [place]  Date  10/05/16  Instruction Review Code  2- meets goals/outcomes      Cardiac Drugs II:  -Group instruction provided by verbal instruction and written materials to support subject.  Instructor reviews cardiac drug classes: angiotensin converting enzyme inhibitors (ACE-I), angiotensin II receptor blockers (ARBs), nitrates, and calcium channel blockers.  Instructor discusses reasons, side effects, and lifestyle considerations for each drug class.   Anatomy and Physiology of the Circulatory System:  Group verbal and written instruction and models provide basic cardiac anatomy and physiology, with the coronary electrical and arterial systems. Review of: AMI, Angina, Valve disease, Heart Failure, Peripheral Artery Disease, Cardiac Arrhythmia, Pacemakers, and the ICD.   CARDIAC REHAB PHASE II EXERCISE from 10/12/2016 in [place]  Date  09/28/16   Instruction Review Code  2- meets goals/outcomes      Other Education:  -Group or individual verbal, written, or video instructions that support the educational goals of the cardiac rehab program.   Knowledge Questionnaire Score:     Knowledge Questionnaire Score - 09/20/16 1032  Knowledge Questionnaire Score   Pre Score 20/24      Core Components/Risk Factors/Patient Goals at Admission:     Personal Goals and Risk Factors at Admission - 09/20/16 1055      Core Components/Risk Factors/Patient Goals on Admission    Weight Management Yes;Weight Maintenance;Obesity;Weight Loss   Intervention Weight Management: Provide education and appropriate resources to help participant work on and attain dietary goals.;Weight Management: Develop a combined nutrition and exercise program designed to reach desired caloric intake, while maintaining appropriate intake of nutrient and fiber, sodium and fats, and appropriate energy expenditure required for the weight goal.;Weight Management/Obesity: Establish reasonable short term and long term weight goals.;Obesity: Provide education and appropriate resources to help participant work on and attain dietary goals.   Expected Outcomes Short Term: Continue to assess and modify interventions until short term weight is achieved;Long Term: Adherence to nutrition and physical activity/exercise program aimed toward attainment of established weight goal;Weight Maintenance: Understanding of the daily nutrition guidelines, which includes 25-35% calories from fat, 7% or less cal from saturated fats, less than 200mg  cholesterol, less than 1.5gm of sodium, & 5 or more servings of fruits and vegetables daily;Weight Loss: Understanding of general recommendations for a balanced deficit meal plan, which promotes 1-2 lb weight loss per week and includes a negative energy balance of [phone] kcal/d;Understanding recommendations for meals to include 15-35% energy as protein,  25-35% energy from fat, 35-60% energy from carbohydrates, less than 200mg  of dietary cholesterol, 20-35 gm of total fiber daily;Understanding of distribution of calorie intake throughout the day with the consumption of 4-5 meals/snacks   Hypertension Yes   Intervention Provide education on lifestyle modifcations including regular physical activity/exercise, weight management, moderate sodium restriction and increased consumption of fresh fruit, vegetables, and low fat dairy, alcohol moderation, and smoking cessation.;Monitor prescription use compliance.   Expected Outcomes Short Term: Continued assessment and intervention until BP is < 140/76mm HG in hypertensive participants. < 130/21mm HG in hypertensive participants with diabetes, heart failure or chronic kidney disease.;Long Term: Maintenance of blood pressure at goal levels.   Lipids Yes   Intervention Provide education and support for participant on nutrition & aerobic/resistive exercise along with prescribed medications to achieve LDL 70mg , HDL >40mg .   Expected Outcomes Short Term: Participant states understanding of desired cholesterol values and is compliant with medications prescribed. Participant is following exercise prescription and nutrition guidelines.;Long Term: Cholesterol controlled with medications as prescribed, with individualized exercise RX and with personalized nutrition plan. Value goals: LDL < 70mg , HDL > 40 mg.   Stress Yes   Personal Goal Other Yes   Personal Goal Be able to walk better. Modify risk factors for heart disease.   Intervention Develop individualized exercise prescription including walking for home to improve walking capacity and distanced as measured by 6 minute walk test. Provide education information regarding risk factor modification including heart healhty eating and home exercise program.   Expected Outcomes Participant states understanding of what risk factors are modifiable. Participant follows heart  healhty diet and follows exercise prescription guidelines.      Core Components/Risk Factors/Patient Goals Review:    Core Components/Risk Factors/Patient Goals at Discharge (Final Review):    ITP Comments:     ITP Comments    Row Name 09/20/16 1054           ITP Comments DR Fransico Him, MEDICAL DIRECTOR           Comments: [name] is making expected progress toward personal goals after completing 8  sessions. Recommend continued exercise and life style modification education including  stress management and relaxation techniques to decrease cardiac risk profile. [name] is doing well with exercise.Barnet Pall, RN,BSN 10/13/2016 11:38 AM

## 2016-10-14 ENCOUNTER — Encounter (HOSPITAL_COMMUNITY)
Admission: RE | Admit: 2016-10-14 | Discharge: 2016-10-14 | Disposition: A | Discharge status: Home / Self Care | Payer: Medicare Other | Source: Ambulatory Visit | Attending: Cardiology | Admitting: Cardiology

## 2016-10-14 DIAGNOSIS — Z955 Presence of coronary angioplasty implant and graft: Secondary | ICD-10-CM | POA: Diagnosis not present

## 2016-10-17 ENCOUNTER — Encounter (HOSPITAL_COMMUNITY)
Admission: RE | Admit: 2016-10-17 | Discharge: 2016-10-17 | Disposition: A | Discharge status: Home / Self Care | Payer: Medicare Other | Source: Ambulatory Visit | Attending: Cardiology | Admitting: Cardiology

## 2016-10-17 DIAGNOSIS — Z955 Presence of coronary angioplasty implant and graft: Secondary | ICD-10-CM

## 2016-10-19 ENCOUNTER — Encounter (HOSPITAL_COMMUNITY): Payer: Medicare Other

## 2016-10-19 ENCOUNTER — Encounter (HOSPITAL_COMMUNITY)
Admission: RE | Admit: 2016-10-19 | Discharge: 2016-10-19 | Disposition: A | Discharge status: Home / Self Care | Payer: Medicare Other | Source: Ambulatory Visit | Attending: Cardiology | Admitting: Cardiology

## 2016-10-19 DIAGNOSIS — Z955 Presence of coronary angioplasty implant and graft: Secondary | ICD-10-CM

## 2016-10-19 NOTE — Progress Notes (Signed)
QUALITY OF LIFE SCORE REVIEW  Pt completed Quality of Life survey as a participant in Cardiac Rehab. Scores 21.0 or below are considered low. Pt score very low in several areas Overall 20.40, Health and Function 14.40, socioeconomic 24.0, physiological and spiritual 27.60, family 30.00. Patient quality of life slightly altered by physical constraints which limits ability to perform as prior to recent cardiac illness. [name] is dissatisfied with his health but says he feels better since he has been participating in  Phase 2 cardiac rehab.  Offered emotional support and reassurance.  Will continue to monitor and intervene as necessary. [name] has good support from his girlfriend..Will forward quality of life and exercise flow sheets to Dr Irven Shelling office for review.Barnet Pall, RN,BSN 10/20/2016 11:42 AM

## 2016-10-21 ENCOUNTER — Encounter (HOSPITAL_COMMUNITY)
Admission: RE | Admit: 2016-10-21 | Discharge: 2016-10-21 | Disposition: A | Discharge status: Home / Self Care | Payer: Medicare Other | Source: Ambulatory Visit | Attending: Cardiology | Admitting: Cardiology

## 2016-10-21 ENCOUNTER — Encounter (HOSPITAL_COMMUNITY): Payer: Medicare Other

## 2016-10-21 DIAGNOSIS — Z955 Presence of coronary angioplasty implant and graft: Secondary | ICD-10-CM

## 2016-10-24 ENCOUNTER — Encounter (HOSPITAL_COMMUNITY)
Admission: RE | Admit: 2016-10-24 | Discharge: 2016-10-24 | Disposition: A | Discharge status: Home / Self Care | Payer: Medicare Other | Source: Ambulatory Visit | Attending: Cardiology | Admitting: Cardiology

## 2016-10-24 ENCOUNTER — Encounter (HOSPITAL_COMMUNITY): Payer: Medicare Other

## 2016-10-24 DIAGNOSIS — Z955 Presence of coronary angioplasty implant and graft: Secondary | ICD-10-CM | POA: Diagnosis not present

## 2016-10-26 ENCOUNTER — Encounter (HOSPITAL_COMMUNITY): Payer: Medicare Other

## 2016-10-26 ENCOUNTER — Encounter (HOSPITAL_COMMUNITY)
Admission: RE | Admit: 2016-10-26 | Discharge: 2016-10-26 | Disposition: A | Discharge status: Home / Self Care | Payer: Medicare Other | Source: Ambulatory Visit | Attending: Cardiology | Admitting: Cardiology

## 2016-10-26 DIAGNOSIS — Z955 Presence of coronary angioplasty implant and graft: Secondary | ICD-10-CM | POA: Diagnosis not present

## 2016-10-28 ENCOUNTER — Encounter (HOSPITAL_COMMUNITY): Payer: Medicare Other

## 2016-10-28 ENCOUNTER — Encounter (HOSPITAL_COMMUNITY)
Admission: RE | Admit: 2016-10-28 | Discharge: 2016-10-28 | Disposition: A | Discharge status: Home / Self Care | Payer: Medicare Other | Source: Ambulatory Visit | Attending: Cardiology | Admitting: Cardiology

## 2016-10-28 DIAGNOSIS — Z955 Presence of coronary angioplasty implant and graft: Secondary | ICD-10-CM

## 2016-11-02 ENCOUNTER — Encounter (HOSPITAL_COMMUNITY)
Admission: RE | Admit: 2016-11-02 | Discharge: 2016-11-02 | Disposition: A | Discharge status: Home / Self Care | Payer: Medicare Other | Source: Ambulatory Visit | Attending: Cardiology | Admitting: Cardiology

## 2016-11-02 ENCOUNTER — Encounter (HOSPITAL_COMMUNITY): Payer: Medicare Other

## 2016-11-02 DIAGNOSIS — Z955 Presence of coronary angioplasty implant and graft: Secondary | ICD-10-CM | POA: Insufficient documentation

## 2016-11-04 ENCOUNTER — Encounter (HOSPITAL_COMMUNITY): Payer: Medicare Other

## 2016-11-04 ENCOUNTER — Encounter (HOSPITAL_COMMUNITY)
Admission: RE | Admit: 2016-11-04 | Discharge: 2016-11-04 | Disposition: A | Discharge status: Home / Self Care | Payer: Medicare Other | Source: Ambulatory Visit | Attending: Cardiology | Admitting: Cardiology

## 2016-11-04 DIAGNOSIS — Z955 Presence of coronary angioplasty implant and graft: Secondary | ICD-10-CM | POA: Diagnosis not present

## 2016-11-04 NOTE — Progress Notes (Signed)
Reviewed home exercise guidelines with patient including endpoints, temperature precautions, target heart rate and rate of perceived exertion. Pt rides his stationary bike 20-25 minutes daily as his mode of home exercise. Encouraged pt to increase duration to 30 minutes, and pt is amenable to this. Pt voices understanding of instructions given. Sol Passer, MS, ACSM CEP

## 2016-11-07 ENCOUNTER — Encounter (HOSPITAL_COMMUNITY)
Admission: RE | Admit: 2016-11-07 | Discharge: 2016-11-07 | Disposition: A | Discharge status: Home / Self Care | Payer: Medicare Other | Source: Ambulatory Visit | Attending: Cardiology | Admitting: Cardiology

## 2016-11-07 ENCOUNTER — Encounter (HOSPITAL_COMMUNITY): Payer: Medicare Other

## 2016-11-07 DIAGNOSIS — Z955 Presence of coronary angioplasty implant and graft: Secondary | ICD-10-CM

## 2016-11-09 ENCOUNTER — Encounter (HOSPITAL_COMMUNITY)
Admission: RE | Admit: 2016-11-09 | Discharge: 2016-11-09 | Disposition: A | Discharge status: Home / Self Care | Payer: Medicare Other | Source: Ambulatory Visit | Attending: Cardiology | Admitting: Cardiology

## 2016-11-09 ENCOUNTER — Encounter (HOSPITAL_COMMUNITY): Payer: Medicare Other

## 2016-11-09 DIAGNOSIS — Z955 Presence of coronary angioplasty implant and graft: Secondary | ICD-10-CM

## 2016-11-09 DIAGNOSIS — E7800 Pure hypercholesterolemia, unspecified: Secondary | ICD-10-CM | POA: Diagnosis not present

## 2016-11-09 DIAGNOSIS — I25119 Atherosclerotic heart disease of native coronary artery with unspecified angina pectoris: Secondary | ICD-10-CM | POA: Diagnosis not present

## 2016-11-09 DIAGNOSIS — I10 Essential (primary) hypertension: Secondary | ICD-10-CM | POA: Diagnosis not present

## 2016-11-09 NOTE — Progress Notes (Signed)
Cardiac Individual Treatment Plan  Patient Details  Name: [name]) Haruki Arnold MRN: [id number] Date of Birth: 08-14-1935 Referring Provider:     CARDIAC REHAB PHASE II ORIENTATION from 09/20/2016 in [place]  Referring Provider  Adrian Prows, MD.      Initial Encounter Date:    CARDIAC REHAB PHASE II ORIENTATION from 09/20/2016 in [place]  Date  09/20/16  Referring Provider  Adrian Prows, MD.      Visit Diagnosis: 08/09/16 Stented coronary artery  Patient's Home Medications on Admission:  Current Outpatient Prescriptions:  .  aspirin EC 81 MG tablet, Take 81 mg by mouth daily., Disp: , Rfl:  .  carbidopa-levodopa (SINEMET IR) 25-100 MG tablet, Take 1 tablet by mouth 3 (three) times daily., Disp: 270 tablet, Rfl: 0 .  cholecalciferol (VITAMIN D) 1000 units tablet, Take 1,000 Units by mouth daily., Disp: , Rfl:  .  clopidogrel (PLAVIX) 75 MG tablet, Take 75 mg by mouth daily., Disp: , Rfl:  .  losartan (COZAAR) 50 MG tablet, Take 50 mg by mouth daily., Disp: , Rfl:  .  metoprolol succinate (TOPROL-XL) 100 MG 24 hr tablet, Take 100 mg by mouth daily. Take with or immediately following a meal., Disp: , Rfl:  .  Multiple Vitamins-Minerals (PRESERVISION AREDS) CAPS, Take 1 capsule by mouth 2 (two) times daily., Disp: , Rfl:  .  rosuvastatin (CRESTOR) 10 MG tablet, Take 10 mg by mouth at bedtime., Disp: , Rfl:  .  tamsulosin (FLOMAX) 0.4 MG CAPS capsule, Take 0.4 mg by mouth at bedtime., Disp: , Rfl:  .  triamterene-hydrochlorothiazide (MAXZIDE-25) 37.5-25 MG tablet, Take 1 tablet by mouth daily., Disp: , Rfl:  .  vitamin E 400 UNIT capsule, Take 400 Units by mouth daily., Disp: , Rfl:   Past Medical History: Past Medical History:  Diagnosis Date  . Arthritis    "knees; lower back" (08/09/2016)  . Atrial fibrillation ([place])    remote hx/notes 08/07/2016  . Episodes of trembling    "most of the time it's my left  hand" (08/09/2016)  . High cholesterol   . History of kidney stones   . Hypertension   . Melanoma of shoulder ([place]) 2000s   "left"  . Mitral regurgitation and mitral stenosis    hx/notes 08/07/2016  . Moderate aortic stenosis    hx/notes 08/07/2016  . Prostate cancer ([place]) 2011   hx/notes 08/07/2016    Tobacco Use: History  Smoking Status  . Former Smoker  . Years: 0.20  . Quit date: 1958  Smokeless Tobacco  . Never Used    Comment: "smoked  2 months when I was 89; non since" (08/09/2016)    Labs: Recent Review Flowsheet Data    Labs for ITP Cardiac and Pulmonary Rehab Latest Ref Rng & Units 08/09/2016   HCO3 20.0 - 28.0 mmol/L 25.1   TCO2 0 - 100 mmol/L 26   O2SAT % 62.0      Capillary Blood Glucose: No results found for: GLUCAP   Exercise Target Goals:    Exercise Program Goal: Individual exercise prescription set with THRR, safety & activity barriers. Participant demonstrates ability to understand and report RPE using BORG scale, to self-measure pulse accurately, and to acknowledge the importance of the exercise prescription.  Exercise Prescription Goal: Starting with aerobic activity 30 plus minutes a day, 3 days per week for initial exercise prescription. Provide home exercise prescription and guidelines that participant acknowledges understanding prior to  discharge.  Activity Barriers & Risk Stratification:     Activity Barriers & Cardiac Risk Stratification - 09/20/16 1149      Activity Barriers & Cardiac Risk Stratification   Activity Barriers Arthritis;Other (comment)   Comments Parkinson's disease.   Cardiac Risk Stratification Moderate      6 Minute Walk:     6 Minute Walk    Row Name 09/20/16 1148         6 Minute Walk   Phase Initial     Distance 1206 feet     Walk Time 6 minutes     # of Rest Breaks 0     MPH 2.28     METS 1.96     RPE 13     VO2 Peak 6.85     Symptoms Yes (comment)     Comments Patient used wheel chair after 2  minutes due to knee pain.     Resting HR 57 bpm     Resting BP 140/80     Max Ex. HR 87 bpm     Max Ex. BP 124/62     2 Minute Post BP 118/68        Oxygen Initial Assessment:   Oxygen Re-Evaluation:   Oxygen Discharge (Final Oxygen Re-Evaluation):   Initial Exercise Prescription:     Initial Exercise Prescription - 09/20/16 1100      Date of Initial Exercise RX and Referring Provider   Date 09/20/16   Referring Provider Adrian Prows, MD.     Bike   Level 0.5   Minutes 10   METs 2     NuStep   Level 2   SPM 85   Minutes 10   METs 2.2     Track   Laps 7   Minutes 10   METs 2.23     Prescription Details   Frequency (times per week) 3   Duration Progress to 30 minutes of continuous aerobic without signs/symptoms of physical distress     Intensity   THRR 40-80% of Max Heartrate 57-114   Ratings of Perceived Exertion 11-13   Perceived Dyspnea 0-4     Progression   Progression Continue to progress workloads to maintain intensity without signs/symptoms of physical distress.     Resistance Training   Training Prescription Yes   Weight 2lb wts   Reps 10-15      Perform Capillary Blood Glucose checks as needed.  Exercise Prescription Changes:     Exercise Prescription Changes    Row Name 10/10/16 1600 11/08/16 1100           Response to Exercise   Blood Pressure (Admit) 124/80 110/62      Blood Pressure (Exercise) 128/82 122/72      Blood Pressure (Exit) 128/70 108/64      Heart Rate (Admit) 66 bpm 62 bpm      Heart Rate (Exercise) 78 bpm 82 bpm      Heart Rate (Exit) 62 bpm 60 bpm      Rating of Perceived Exertion (Exercise) 11 12      Symptoms  - none      Duration Progress to 45 minutes of aerobic exercise without signs/symptoms of physical distress Progress to 45 minutes of aerobic exercise without signs/symptoms of physical distress      Intensity THRR unchanged THRR unchanged        Progression   Progression Continue to progress  workloads to maintain intensity without signs/symptoms of physical distress.  Continue to progress workloads to maintain intensity without signs/symptoms of physical distress.      Average METs 1.9 2.9        Resistance Training   Training Prescription Yes Yes      Weight 2lb wts 4lbs      Reps 10-15 10-15      Time  - 10 Minutes        Interval Training   Interval Training  - No        Bike   Level 0.5 -      Minutes 10 -      METs 2 -        Recumbant Bike   Level  - 3      Watts  - 41      Minutes  - 10      METs  - 3.37        NuStep   Level 2 4      SPM 85 85      Minutes 20 20      METs 1.8 2.7        Arm Ergometer   Level  - 3      Watts  - 49      Minutes  - 10      METs  - 2.6        Home Exercise Plan   Plans to continue exercise at  - Home (comment)      Frequency  - Add 4 additional days to program exercise sessions.      Initial Home Exercises Provided  - 11/04/16         Exercise Comments:     Exercise Comments    Row Name 10/10/16 1617 11/04/16 1638 11/09/16 1146       Exercise Comments Pt is off to a great start with exercise! Reviewed home exercise guidelines with patient. Reviewed METs and goals with patient.        Exercise Goals and Review:     Exercise Goals    Row Name 09/20/16 1151             Exercise Goals   Increase Physical Activity Yes       Intervention Provide advice, education, support and counseling about physical activity/exercise needs.;Develop an individualized exercise prescription for aerobic and resistive training based on initial evaluation findings, risk stratification, comorbidities and participant's personal goals.       Expected Outcomes Achievement of increased cardiorespiratory fitness and enhanced flexibility, muscular endurance and strength shown through measurements of functional capacity and personal statement of participant.          Exercise Goals Re-Evaluation :     Exercise Goals Re-Evaluation     Row Name 11/09/16 1146             Exercise Goal Re-Evaluation   Exercise Goals Review Increase Physical Activity;Increase Strength and Stamina       Comments Pt has increased his home exercise to 30 minutes riding his stationary bike daily. Patient's walking is limited by chronic knee pain.       Expected Outcomes Continue riding stationary bike 30 minutes in addition to cardiac rehab.           Discharge Exercise Prescription (Final Exercise Prescription Changes):     Exercise Prescription Changes - 11/08/16 1100      Response to Exercise   Blood Pressure (Admit) 110/62   Blood Pressure (Exercise) 122/72   Blood Pressure (  Exit) 108/64   Heart Rate (Admit) 62 bpm   Heart Rate (Exercise) 82 bpm   Heart Rate (Exit) 60 bpm   Rating of Perceived Exertion (Exercise) 12   Symptoms none   Duration Progress to 45 minutes of aerobic exercise without signs/symptoms of physical distress   Intensity THRR unchanged     Progression   Progression Continue to progress workloads to maintain intensity without signs/symptoms of physical distress.   Average METs 2.9     Resistance Training   Training Prescription Yes   Weight 4lbs   Reps 10-15   Time 10 Minutes     Interval Training   Interval Training No     Bike   Level --   Minutes --   METs --     Recumbant Bike   Level 3   Watts 41   Minutes 10   METs 3.37     NuStep   Level 4   SPM 85   Minutes 20   METs 2.7     Arm Ergometer   Level 3   Watts 49   Minutes 10   METs 2.6     Home Exercise Plan   Plans to continue exercise at Home (comment)   Frequency Add 4 additional days to program exercise sessions.   Initial Home Exercises Provided 11/04/16      Nutrition:  Target Goals: Understanding of nutrition guidelines, daily intake of sodium 1500mg , cholesterol 200mg , calories 30% from fat and 7% or less from saturated fats, daily to have 5 or more servings of fruits and vegetables.  Biometrics:      Pre Biometrics - 09/20/16 1125      Pre Biometrics   Height 5\' 9"  (1.753 m)   Waist Circumference 42.75 inches   Hip Circumference 42 inches   Waist to Hip Ratio 1.02 %   BMI (Calculated) 30.9   Triceps Skinfold 21 mm   % Body Fat 31.6 %   Grip Strength 33.5 kg   Flexibility 9 in   Single Leg Stand 2.14 seconds       Nutrition Therapy Plan and Nutrition Goals:     Nutrition Therapy & Goals - 09/28/16 1013      Nutrition Therapy   Diet Therapeutic Lifestyle Changes     Personal Nutrition Goals   Nutrition Goal Pt to identify and limit food sources of saturated fat, trans fat, and sodium     Intervention Plan   Intervention Prescribe, educate and counsel regarding individualized specific dietary modifications aiming towards targeted core components such as weight, hypertension, lipid management, diabetes, heart failure and other comorbidities.   Expected Outcomes Short Term Goal: Understand basic principles of dietary content, such as calories, fat, sodium, cholesterol and nutrients.;Long Term Goal: Adherence to prescribed nutrition plan.      Nutrition Discharge: Nutrition Scores:     Nutrition Assessments - 09/28/16 1012      MEDFICTS Scores   Pre Score 53      Nutrition Goals Re-Evaluation:   Nutrition Goals Re-Evaluation:   Nutrition Goals Discharge (Final Nutrition Goals Re-Evaluation):   Psychosocial: Target Goals: Acknowledge presence or absence of significant depression and/or stress, maximize coping skills, provide positive support system. Participant is able to verbalize types and ability to use techniques and skills needed for reducing stress and depression.  Initial Review & Psychosocial Screening:     Initial Psych Review & Screening - 09/20/16 1109      Initial Review   Current issues  with --  health related anxiety   Source of Stress Concerns Chronic Illness;Unable to participate in former interests or hobbies;Unable to perform  yard/household activities   Comments upon brief assessment, pt exhibits health related anxiety, otherwise no psychosocial needs identified, no interventions necessary      [place]? Yes  partner, Tamsen Snider     Barriers   Psychosocial barriers to participate in program The patient should benefit from training in stress management and relaxation.     Screening Interventions   Interventions Encouraged to exercise;Provide feedback about the scores to participant      Quality of Life Scores:     Quality of Life - 09/20/16 1032      Quality of Life Scores   Health/Function Pre 14.4 %   Socioeconomic Pre 24 %   Psych/Spiritual Pre 27.6 %   Family Pre 30 %   GLOBAL Pre 20.4 %      PHQ-9: Recent Review Flowsheet Data    Depression screen [facility] 2/9 09/26/2016   Decreased Interest 0   Down, Depressed, Hopeless 0   PHQ - 2 Score 0     Interpretation of Total Score  Total Score Depression Severity:  1-4 = Minimal depression, 5-9 = Mild depression, 10-14 = Moderate depression, 15-19 = Moderately severe depression, 20-27 = Severe depression   Psychosocial Evaluation and Intervention:   Psychosocial Re-Evaluation:     Psychosocial Re-Evaluation    Row Name 10/13/16 1131 11/09/16 1806           Psychosocial Re-Evaluation   Current issues with None Identified None Identified      Interventions Encouraged to attend Cardiac Rehabilitation for the exercise Encouraged to attend Cardiac Rehabilitation for the exercise      Continue Psychosocial Services  No Follow up required No Follow up required         Psychosocial Discharge (Final Psychosocial Re-Evaluation):     Psychosocial Re-Evaluation - 11/09/16 1806      Psychosocial Re-Evaluation   Current issues with None Identified   Interventions Encouraged to attend Cardiac Rehabilitation for the exercise   Continue Psychosocial Services  No Follow up required      Vocational  Rehabilitation: Provide vocational rehab assistance to qualifying candidates.   Vocational Rehab Evaluation & Intervention:     Vocational Rehab - 09/20/16 1101      Initial Vocational Rehab Evaluation & Intervention   Assessment shows need for Vocational Rehabilitation No      Education: Education Goals: Education classes will be provided on a weekly basis, covering required topics. Participant will state understanding/return demonstration of topics presented.  Learning Barriers/Preferences:     Learning Barriers/Preferences - 09/20/16 1221      Learning Barriers/Preferences   Learning Barriers Sight   Learning Preferences None      Education Topics: Count Your Pulse:  -Group instruction provided by verbal instruction, demonstration, patient participation and written materials to support subject.  Instructors address importance of being able to find your pulse and how to count your pulse when at home without a heart monitor.  Patients get hands on experience counting their pulse with staff help and individually.   Heart Attack, Angina, and Risk Factor Modification:  -Group instruction provided by verbal instruction, video, and written materials to support subject.  Instructors address signs and symptoms of angina and heart attacks.    Also discuss risk factors for heart disease and how to make changes to improve heart health  risk factors.   Functional Fitness:  -Group instruction provided by verbal instruction, demonstration, patient participation, and written materials to support subject.  Instructors address safety measures for doing things around the house.  Discuss how to get up and down off the floor, how to pick things up properly, how to safely get out of a chair without assistance, and balance training.   Meditation and Mindfulness:  -Group instruction provided by verbal instruction, patient participation, and written materials to support subject.  Instructor  addresses importance of mindfulness and meditation practice to help reduce stress and improve awareness.  Instructor also leads participants through a meditation exercise.    CARDIAC REHAB PHASE II EXERCISE from 11/02/2016 in [place]  Date  10/12/16  Instruction Review Code  2- meets goals/outcomes      Stretching for Flexibility and Mobility:  -Group instruction provided by verbal instruction, patient participation, and written materials to support subject.  Instructors lead participants through series of stretches that are designed to increase flexibility thus improving mobility.  These stretches are additional exercise for major muscle groups that are typically performed during regular warm up and cool down.   CARDIAC REHAB PHASE II EXERCISE from 11/02/2016 in [place]  Date  10/21/16  Educator  EP  Instruction Review Code  2- meets goals/outcomes      Hands Only CPR:  -Group verbal, video, and participation provides a basic overview of AHA guidelines for community CPR. Role-play of emergencies allow participants the opportunity to practice calling for help and chest compression technique with discussion of AED use.   Hypertension: -Group verbal and written instruction that provides a basic overview of hypertension including the most recent diagnostic guidelines, risk factor reduction with self-care instructions and medication management.   CARDIAC REHAB PHASE II EXERCISE from 11/02/2016 in [place]  Date  10/07/16  Instruction Review Code  2- meets goals/outcomes       Nutrition I class: Heart Healthy Eating:  -Group instruction provided by PowerPoint slides, verbal discussion, and written materials to support subject matter. The instructor gives an explanation and review of the Therapeutic Lifestyle Changes diet recommendations, which includes a discussion on lipid goals, dietary fat,  sodium, fiber, plant stanol/sterol esters, sugar, and the components of a well-balanced, healthy diet.   CARDIAC REHAB PHASE II EXERCISE from 11/02/2016 in [place]  Date  10/04/16  Educator  RD  Instruction Review Code  2- meets goals/outcomes      Nutrition II class: Lifestyle Skills:  -Group instruction provided by PowerPoint slides, verbal discussion, and written materials to support subject matter. The instructor gives an explanation and review of label reading, grocery shopping for heart health, heart healthy recipe modifications, and ways to make healthier choices when eating out.   CARDIAC REHAB PHASE II EXERCISE from 11/02/2016 in [place]  Date  10/10/16  Educator  RD  Instruction Review Code  Not applicable      Diabetes Question & Answer:  -Group instruction provided by PowerPoint slides, verbal discussion, and written materials to support subject matter. The instructor gives an explanation and review of diabetes co-morbidities, pre- and post-prandial blood glucose goals, pre-exercise blood glucose goals, signs, symptoms, and treatment of hypoglycemia and hyperglycemia, and foot care basics.   Diabetes Blitz:  -Group instruction provided by PowerPoint slides, verbal discussion, and written materials to support subject matter. The instructor gives an  explanation and review of the physiology behind type 1 and type 2 diabetes, diabetes medications and rational behind using different medications, pre- and post-prandial blood glucose recommendations and Hemoglobin A1c goals, diabetes diet, and exercise including blood glucose guidelines for exercising safely.    Portion Distortion:  -Group instruction provided by PowerPoint slides, verbal discussion, written materials, and food models to support subject matter. The instructor gives an explanation of serving size versus portion size, changes in portions sizes over the  last 20 years, and what consists of a serving from each food group.   CARDIAC REHAB PHASE II EXERCISE from 11/02/2016 in [place]  Date  10/19/16  Educator  RD  Instruction Review Code  2- meets goals/outcomes      Stress Management:  -Group instruction provided by verbal instruction, video, and written materials to support subject matter.  Instructors review role of stress in heart disease and how to cope with stress positively.     Exercising on Your Own:  -Group instruction provided by verbal instruction, power point, and written materials to support subject.  Instructors discuss benefits of exercise, components of exercise, frequency and intensity of exercise, and end points for exercise.  Also discuss use of nitroglycerin and activating EMS.  Review options of places to exercise outside of rehab.  Review guidelines for sex with heart disease.   CARDIAC REHAB PHASE II EXERCISE from 11/02/2016 in [place]  Date  11/02/16  Instruction Review Code  2- meets goals/outcomes      Cardiac Drugs I:  -Group instruction provided by verbal instruction and written materials to support subject.  Instructor reviews cardiac drug classes: antiplatelets, anticoagulants, beta blockers, and statins.  Instructor discusses reasons, side effects, and lifestyle considerations for each drug class.   CARDIAC REHAB PHASE II EXERCISE from 10/05/2016 in [place]  Date  10/05/16  Instruction Review Code  2- meets goals/outcomes      Cardiac Drugs II:  -Group instruction provided by verbal instruction and written materials to support subject.  Instructor reviews cardiac drug classes: angiotensin converting enzyme inhibitors (ACE-I), angiotensin II receptor blockers (ARBs), nitrates, and calcium channel blockers.  Instructor discusses reasons, side effects, and lifestyle considerations for each drug class.   Anatomy  and Physiology of the Circulatory System:  Group verbal and written instruction and models provide basic cardiac anatomy and physiology, with the coronary electrical and arterial systems. Review of: AMI, Angina, Valve disease, Heart Failure, Peripheral Artery Disease, Cardiac Arrhythmia, Pacemakers, and the ICD.   CARDIAC REHAB PHASE II EXERCISE from 11/02/2016 in [place]  Date  09/28/16  Instruction Review Code  2- meets goals/outcomes      Other Education:  -Group or individual verbal, written, or video instructions that support the educational goals of the cardiac rehab program.   Knowledge Questionnaire Score:     Knowledge Questionnaire Score - 09/20/16 1032      Knowledge Questionnaire Score   Pre Score 20/24      Core Components/Risk Factors/Patient Goals at Admission:     Personal Goals and Risk Factors at Admission - 09/20/16 1055      Core Components/Risk Factors/Patient Goals on Admission    Weight Management Yes;Weight Maintenance;Obesity;Weight Loss   Intervention Weight Management: Provide education and appropriate resources to help participant work on and attain dietary goals.;Weight Management: Develop a combined nutrition and exercise program designed to reach desired caloric intake, while  maintaining appropriate intake of nutrient and fiber, sodium and fats, and appropriate energy expenditure required for the weight goal.;Weight Management/Obesity: Establish reasonable short term and long term weight goals.;Obesity: Provide education and appropriate resources to help participant work on and attain dietary goals.   Expected Outcomes Short Term: Continue to assess and modify interventions until short term weight is achieved;Long Term: Adherence to nutrition and physical activity/exercise program aimed toward attainment of established weight goal;Weight Maintenance: Understanding of the daily nutrition guidelines, which includes 25-35%  calories from fat, 7% or less cal from saturated fats, less than 200mg  cholesterol, less than 1.5gm of sodium, & 5 or more servings of fruits and vegetables daily;Weight Loss: Understanding of general recommendations for a balanced deficit meal plan, which promotes 1-2 lb weight loss per week and includes a negative energy balance of [id number] kcal/d;Understanding recommendations for meals to include 15-35% energy as protein, 25-35% energy from fat, 35-60% energy from carbohydrates, less than 200mg  of dietary cholesterol, 20-35 gm of total fiber daily;Understanding of distribution of calorie intake throughout the day with the consumption of 4-5 meals/snacks   Hypertension Yes   Intervention Provide education on lifestyle modifcations including regular physical activity/exercise, weight management, moderate sodium restriction and increased consumption of fresh fruit, vegetables, and low fat dairy, alcohol moderation, and smoking cessation.;Monitor prescription use compliance.   Expected Outcomes Short Term: Continued assessment and intervention until BP is < 140/56mm HG in hypertensive participants. < 130/52mm HG in hypertensive participants with diabetes, heart failure or chronic kidney disease.;Long Term: Maintenance of blood pressure at goal levels.   Lipids Yes   Intervention Provide education and support for participant on nutrition & aerobic/resistive exercise along with prescribed medications to achieve LDL 70mg , HDL >40mg .   Expected Outcomes Short Term: Participant states understanding of desired cholesterol values and is compliant with medications prescribed. Participant is following exercise prescription and nutrition guidelines.;Long Term: Cholesterol controlled with medications as prescribed, with individualized exercise RX and with personalized nutrition plan. Value goals: LDL < 70mg , HDL > 40 mg.   Stress Yes   Personal Goal Other Yes   Personal Goal Be able to walk better. Modify risk factors  for heart disease.   Intervention Develop individualized exercise prescription including walking for home to improve walking capacity and distanced as measured by 6 minute walk test. Provide education information regarding risk factor modification including heart healhty eating and home exercise program.   Expected Outcomes Participant states understanding of what risk factors are modifiable. Participant follows heart healhty diet and follows exercise prescription guidelines.      Core Components/Risk Factors/Patient Goals Review:      Goals and Risk Factor Review    Row Name 11/09/16 1804             Core Components/Risk Factors/Patient Goals Review   Personal Goals Review Weight Management/Obesity;Hypertension;Lipids       Review [name] vital signs have been stable at cardiac rehab.        Expected Outcomes [name] will continue to have a good response to exercise and take medications as prescribed          Core Components/Risk Factors/Patient Goals at Discharge (Final Review):      Goals and Risk Factor Review - 11/09/16 1804      Core Components/Risk Factors/Patient Goals Review   Personal Goals Review Weight Management/Obesity;Hypertension;Lipids   Review [name] vital signs have been stable at cardiac rehab.    Expected Outcomes [name] will continue to have a good response to  exercise and take medications as prescribed      ITP Comments:     ITP Comments    Row Name 09/20/16 1054           ITP Comments DR Fransico Him, MEDICAL DIRECTOR           Comments: [name] is making expected progress toward personal goals after completing 18 sessions. Recommend continued exercise and life style modification education including  stress management and relaxation techniques to decrease cardiac risk profile. [name] is doing well with exercise. Barnet Pall, RN,BSN 11/09/2016 6:10 PM

## 2016-11-11 ENCOUNTER — Encounter (HOSPITAL_COMMUNITY)
Admission: RE | Admit: 2016-11-11 | Discharge: 2016-11-11 | Disposition: A | Discharge status: Home / Self Care | Payer: Medicare Other | Source: Ambulatory Visit | Attending: Cardiology | Admitting: Cardiology

## 2016-11-11 ENCOUNTER — Encounter (HOSPITAL_COMMUNITY): Payer: Medicare Other

## 2016-11-11 DIAGNOSIS — Z955 Presence of coronary angioplasty implant and graft: Secondary | ICD-10-CM

## 2016-11-12 ENCOUNTER — Other Ambulatory Visit: Payer: Self-pay | Admitting: Neurology

## 2016-11-14 ENCOUNTER — Encounter (HOSPITAL_COMMUNITY)
Admission: RE | Admit: 2016-11-14 | Discharge: 2016-11-14 | Disposition: A | Discharge status: Home / Self Care | Payer: Medicare Other | Source: Ambulatory Visit | Attending: Cardiology | Admitting: Cardiology

## 2016-11-14 ENCOUNTER — Encounter (HOSPITAL_COMMUNITY): Payer: Medicare Other

## 2016-11-14 DIAGNOSIS — Z955 Presence of coronary angioplasty implant and graft: Secondary | ICD-10-CM

## 2016-11-16 ENCOUNTER — Encounter (HOSPITAL_COMMUNITY)
Admission: RE | Admit: 2016-11-16 | Discharge: 2016-11-16 | Disposition: A | Discharge status: Home / Self Care | Payer: Medicare Other | Source: Ambulatory Visit | Attending: Cardiology | Admitting: Cardiology

## 2016-11-16 ENCOUNTER — Encounter (HOSPITAL_COMMUNITY): Payer: Medicare Other

## 2016-11-16 DIAGNOSIS — Z955 Presence of coronary angioplasty implant and graft: Secondary | ICD-10-CM | POA: Diagnosis not present

## 2016-11-18 ENCOUNTER — Encounter (HOSPITAL_COMMUNITY): Payer: Medicare Other

## 2016-11-18 ENCOUNTER — Encounter (HOSPITAL_COMMUNITY)
Admission: RE | Admit: 2016-11-18 | Discharge: 2016-11-18 | Disposition: A | Discharge status: Home / Self Care | Payer: Medicare Other | Source: Ambulatory Visit | Attending: Cardiology | Admitting: Cardiology

## 2016-11-18 DIAGNOSIS — Z955 Presence of coronary angioplasty implant and graft: Secondary | ICD-10-CM | POA: Diagnosis not present

## 2016-11-18 DIAGNOSIS — N183 Chronic kidney disease, stage 3 (moderate): Secondary | ICD-10-CM | POA: Diagnosis not present

## 2016-11-18 DIAGNOSIS — I10 Essential (primary) hypertension: Secondary | ICD-10-CM | POA: Diagnosis not present

## 2016-11-18 DIAGNOSIS — I25119 Atherosclerotic heart disease of native coronary artery with unspecified angina pectoris: Secondary | ICD-10-CM | POA: Diagnosis not present

## 2016-11-18 DIAGNOSIS — R0609 Other forms of dyspnea: Secondary | ICD-10-CM | POA: Diagnosis not present

## 2016-11-21 ENCOUNTER — Encounter (HOSPITAL_COMMUNITY): Payer: Medicare Other

## 2016-11-21 ENCOUNTER — Encounter (HOSPITAL_COMMUNITY)
Admission: RE | Admit: 2016-11-21 | Discharge: 2016-11-21 | Disposition: A | Discharge status: Home / Self Care | Payer: Medicare Other | Source: Ambulatory Visit | Attending: Cardiology | Admitting: Cardiology

## 2016-11-21 DIAGNOSIS — Z955 Presence of coronary angioplasty implant and graft: Secondary | ICD-10-CM | POA: Diagnosis not present

## 2016-11-23 ENCOUNTER — Encounter (HOSPITAL_COMMUNITY)
Admission: RE | Admit: 2016-11-23 | Discharge: 2016-11-23 | Disposition: A | Discharge status: Home / Self Care | Payer: Medicare Other | Source: Ambulatory Visit | Attending: Cardiology | Admitting: Cardiology

## 2016-11-23 ENCOUNTER — Encounter (HOSPITAL_COMMUNITY): Payer: Medicare Other

## 2016-11-23 DIAGNOSIS — Z955 Presence of coronary angioplasty implant and graft: Secondary | ICD-10-CM | POA: Diagnosis not present

## 2016-11-25 ENCOUNTER — Encounter (HOSPITAL_COMMUNITY): Payer: Medicare Other

## 2016-11-25 ENCOUNTER — Encounter (HOSPITAL_COMMUNITY)
Admission: RE | Admit: 2016-11-25 | Discharge: 2016-11-25 | Disposition: A | Discharge status: Home / Self Care | Payer: Medicare Other | Source: Ambulatory Visit | Attending: Cardiology | Admitting: Cardiology

## 2016-11-25 DIAGNOSIS — Z955 Presence of coronary angioplasty implant and graft: Secondary | ICD-10-CM

## 2016-11-28 ENCOUNTER — Encounter (HOSPITAL_COMMUNITY)
Admission: RE | Admit: 2016-11-28 | Discharge: 2016-11-28 | Disposition: A | Discharge status: Home / Self Care | Payer: Medicare Other | Source: Ambulatory Visit | Attending: Cardiology | Admitting: Cardiology

## 2016-11-28 ENCOUNTER — Encounter (HOSPITAL_COMMUNITY): Payer: Medicare Other

## 2016-11-28 DIAGNOSIS — Z955 Presence of coronary angioplasty implant and graft: Secondary | ICD-10-CM | POA: Diagnosis not present

## 2016-11-30 ENCOUNTER — Encounter (HOSPITAL_COMMUNITY)
Admission: RE | Admit: 2016-11-30 | Discharge: 2016-11-30 | Disposition: A | Discharge status: Home / Self Care | Payer: Medicare Other | Source: Ambulatory Visit | Attending: Cardiology | Admitting: Cardiology

## 2016-11-30 ENCOUNTER — Encounter (HOSPITAL_COMMUNITY): Payer: Medicare Other

## 2016-11-30 DIAGNOSIS — Z955 Presence of coronary angioplasty implant and graft: Secondary | ICD-10-CM | POA: Diagnosis not present

## 2016-12-02 ENCOUNTER — Encounter (HOSPITAL_COMMUNITY)
Admission: RE | Admit: 2016-12-02 | Discharge: 2016-12-02 | Disposition: A | Discharge status: Home / Self Care | Payer: Medicare Other | Source: Ambulatory Visit | Attending: Cardiology | Admitting: Cardiology

## 2016-12-02 ENCOUNTER — Encounter (HOSPITAL_COMMUNITY): Payer: Medicare Other

## 2016-12-02 DIAGNOSIS — Z955 Presence of coronary angioplasty implant and graft: Secondary | ICD-10-CM | POA: Diagnosis not present

## 2016-12-05 ENCOUNTER — Encounter (HOSPITAL_COMMUNITY): Payer: Medicare Other

## 2016-12-05 ENCOUNTER — Encounter (HOSPITAL_COMMUNITY)
Admission: RE | Admit: 2016-12-05 | Discharge: 2016-12-05 | Disposition: A | Discharge status: Home / Self Care | Payer: Medicare Other | Source: Ambulatory Visit | Attending: Cardiology | Admitting: Cardiology

## 2016-12-05 DIAGNOSIS — I2510 Atherosclerotic heart disease of native coronary artery without angina pectoris: Secondary | ICD-10-CM | POA: Diagnosis not present

## 2016-12-05 DIAGNOSIS — Z955 Presence of coronary angioplasty implant and graft: Secondary | ICD-10-CM | POA: Diagnosis not present

## 2016-12-05 DIAGNOSIS — G20 Parkinson's disease: Secondary | ICD-10-CM | POA: Diagnosis not present

## 2016-12-05 DIAGNOSIS — I10 Essential (primary) hypertension: Secondary | ICD-10-CM | POA: Diagnosis not present

## 2016-12-05 DIAGNOSIS — E782 Mixed hyperlipidemia: Secondary | ICD-10-CM | POA: Diagnosis not present

## 2016-12-05 DIAGNOSIS — Z8546 Personal history of malignant neoplasm of prostate: Secondary | ICD-10-CM | POA: Diagnosis not present

## 2016-12-05 DIAGNOSIS — N183 Chronic kidney disease, stage 3 (moderate): Secondary | ICD-10-CM | POA: Diagnosis not present

## 2016-12-07 ENCOUNTER — Encounter (HOSPITAL_COMMUNITY): Payer: Medicare Other

## 2016-12-07 ENCOUNTER — Encounter (HOSPITAL_COMMUNITY)
Admission: RE | Admit: 2016-12-07 | Discharge: 2016-12-07 | Disposition: A | Discharge status: Home / Self Care | Payer: Medicare Other | Source: Ambulatory Visit | Attending: Cardiology | Admitting: Cardiology

## 2016-12-07 DIAGNOSIS — Z955 Presence of coronary angioplasty implant and graft: Secondary | ICD-10-CM | POA: Diagnosis not present

## 2016-12-07 NOTE — Progress Notes (Signed)
Cardiac Individual Treatment Plan  Patient Details  Name: [name]) Errick Salts MRN: [id number] Date of Birth: 05-04-1935 Referring Provider:     CARDIAC REHAB PHASE II ORIENTATION from 09/20/2016 in [place]  Referring Provider  Adrian Prows, MD.      Initial Encounter Date:    CARDIAC REHAB PHASE II ORIENTATION from 09/20/2016 in [place]  Date  09/20/16  Referring Provider  Adrian Prows, MD.      Visit Diagnosis: 08/09/16 Stented coronary artery  Patient's Home Medications on Admission:  Current Outpatient Prescriptions:  .  aspirin EC 81 MG tablet, Take 81 mg by mouth daily., Disp: , Rfl:  .  carbidopa-levodopa (SINEMET IR) 25-100 MG tablet, TAKE 1 TABLET THREE TIMES DAILY, Disp: 270 tablet, Rfl: 0 .  cholecalciferol (VITAMIN D) 1000 units tablet, Take 1,000 Units by mouth daily., Disp: , Rfl:  .  clopidogrel (PLAVIX) 75 MG tablet, Take 75 mg by mouth daily., Disp: , Rfl:  .  losartan (COZAAR) 50 MG tablet, Take 50 mg by mouth daily., Disp: , Rfl:  .  metoprolol succinate (TOPROL-XL) 100 MG 24 hr tablet, Take 100 mg by mouth daily. Take with or immediately following a meal., Disp: , Rfl:  .  Multiple Vitamins-Minerals (PRESERVISION AREDS) CAPS, Take 1 capsule by mouth 2 (two) times daily., Disp: , Rfl:  .  rosuvastatin (CRESTOR) 10 MG tablet, Take 10 mg by mouth at bedtime., Disp: , Rfl:  .  tamsulosin (FLOMAX) 0.4 MG CAPS capsule, Take 0.4 mg by mouth at bedtime., Disp: , Rfl:  .  triamterene-hydrochlorothiazide (MAXZIDE-25) 37.5-25 MG tablet, Take 1 tablet by mouth daily., Disp: , Rfl:  .  vitamin E 400 UNIT capsule, Take 400 Units by mouth daily., Disp: , Rfl:   Past Medical History: Past Medical History:  Diagnosis Date  . Arthritis    "knees; lower back" (08/09/2016)  . Atrial fibrillation ([place])    remote hx/notes 08/07/2016  . Episodes of trembling    "most of the time it's my left hand" (08/09/2016)   . High cholesterol   . History of kidney stones   . Hypertension   . Melanoma of shoulder ([place]) 2000s   "left"  . Mitral regurgitation and mitral stenosis    hx/notes 08/07/2016  . Moderate aortic stenosis    hx/notes 08/07/2016  . Prostate cancer ([place]) 2011   hx/notes 08/07/2016    Tobacco Use: History  Smoking Status  . Former Smoker  . Years: 0.20  . Quit date: 1958  Smokeless Tobacco  . Never Used    Comment: "smoked  2 months when I was 39; non since" (08/09/2016)    Labs: Recent Review Flowsheet Data    Labs for ITP Cardiac and Pulmonary Rehab Latest Ref Rng & Units 08/09/2016   HCO3 20.0 - 28.0 mmol/L 25.1   TCO2 0 - 100 mmol/L 26   O2SAT % 62.0      Capillary Blood Glucose: No results found for: GLUCAP   Exercise Target Goals:    Exercise Program Goal: Individual exercise prescription set with THRR, safety & activity barriers. Participant demonstrates ability to understand and report RPE using BORG scale, to self-measure pulse accurately, and to acknowledge the importance of the exercise prescription.  Exercise Prescription Goal: Starting with aerobic activity 30 plus minutes a day, 3 days per week for initial exercise prescription. Provide home exercise prescription and guidelines that participant acknowledges understanding prior to discharge.  Activity  Barriers & Risk Stratification:     Activity Barriers & Cardiac Risk Stratification - 09/20/16 1149      Activity Barriers & Cardiac Risk Stratification   Activity Barriers Arthritis;Other (comment)   Comments Parkinson's disease.   Cardiac Risk Stratification Moderate      6 Minute Walk:     6 Minute Walk    Row Name 09/20/16 1148         6 Minute Walk   Phase Initial     Distance 1206 feet     Walk Time 6 minutes     # of Rest Breaks 0     MPH 2.28     METS 1.96     RPE 13     VO2 Peak 6.85     Symptoms Yes (comment)     Comments Patient used wheel chair after 2 minutes due to knee  pain.     Resting HR 57 bpm     Resting BP 140/80     Max Ex. HR 87 bpm     Max Ex. BP 124/62     2 Minute Post BP 118/68        Oxygen Initial Assessment:   Oxygen Re-Evaluation:   Oxygen Discharge (Final Oxygen Re-Evaluation):   Initial Exercise Prescription:     Initial Exercise Prescription - 09/20/16 1100      Date of Initial Exercise RX and Referring Provider   Date 09/20/16   Referring Provider Adrian Prows, MD.     Bike   Level 0.5   Minutes 10   METs 2     NuStep   Level 2   SPM 85   Minutes 10   METs 2.2     Track   Laps 7   Minutes 10   METs 2.23     Prescription Details   Frequency (times per week) 3   Duration Progress to 30 minutes of continuous aerobic without signs/symptoms of physical distress     Intensity   THRR 40-80% of Max Heartrate 57-114   Ratings of Perceived Exertion 11-13   Perceived Dyspnea 0-4     Progression   Progression Continue to progress workloads to maintain intensity without signs/symptoms of physical distress.     Resistance Training   Training Prescription Yes   Weight 2lb wts   Reps 10-15      Perform Capillary Blood Glucose checks as needed.  Exercise Prescription Changes:     Exercise Prescription Changes    Row Name 10/10/16 1600 11/08/16 1100 11/21/16 1800 12/05/16 1500       Response to Exercise   Blood Pressure (Admit) 124/80 110/62 122/80 112/80    Blood Pressure (Exercise) 128/82 122/72 106/62 132/70    Blood Pressure (Exit) 128/70 108/64 128/68 120/70    Heart Rate (Admit) 66 bpm 62 bpm 68 bpm 64 bpm    Heart Rate (Exercise) 78 bpm 82 bpm 78 bpm 78 bpm    Heart Rate (Exit) 62 bpm 60 bpm 57 bpm 65 bpm    Rating of Perceived Exertion (Exercise) 11 12 12 12     Symptoms  - none none none    Duration Progress to 45 minutes of aerobic exercise without signs/symptoms of physical distress Progress to 45 minutes of aerobic exercise without signs/symptoms of physical distress Progress to 45 minutes of  aerobic exercise without signs/symptoms of physical distress Progress to 45 minutes of aerobic exercise without signs/symptoms of physical distress    Intensity THRR unchanged  THRR unchanged THRR unchanged THRR unchanged      Progression   Progression Continue to progress workloads to maintain intensity without signs/symptoms of physical distress. Continue to progress workloads to maintain intensity without signs/symptoms of physical distress. Continue to progress workloads to maintain intensity without signs/symptoms of physical distress. Continue to progress workloads to maintain intensity without signs/symptoms of physical distress.    Average METs 1.9 2.9 2.8 3.4      Resistance Training   Training Prescription Yes Yes Yes Yes    Weight 2lb wts 4lbs 4lbs 4lbs    Reps 10-15 10-15 10-15 10-15    Time  - 10 Minutes 10 Minutes 10 Minutes      Interval Training   Interval Training  - No No No      Bike   Level 0.5 -  -  -    Minutes 10 -  -  -    METs 2 -  -  -      Recumbant Bike   Level  - 3 4 5     Watts  - 41 48 56    Minutes  - 10 10 10     METs  - 3.37 3.58 3.87      NuStep   Level 2 4 4 4     SPM 85 85 85 85    Minutes 20 20 20 20     METs 1.8 2.7 2.4 2.8      Arm Ergometer   Level  - 3 4 4     Watts  - 49 45 45    Minutes  - 10 10 10     METs  - 2.6 2.4 3.55      Home Exercise Plan   Plans to continue exercise at  - Home (comment) Home (comment) Home (comment)    Frequency  - Add 4 additional days to program exercise sessions. Add 4 additional days to program exercise sessions. Add 4 additional days to program exercise sessions.    Initial Home Exercises Provided  - 11/04/16 11/04/16 11/04/16       Exercise Comments:     Exercise Comments    Row Name 10/10/16 1617 11/04/16 1638 11/09/16 1146 11/23/16 1117 12/07/16 1000   Exercise Comments Pt is off to a great start with exercise! Reviewed home exercise guidelines with patient. Reviewed METs and goals with patient.  Reviewed METs with patient. Reviewed METs and goals with patient.      Exercise Goals and Review:     Exercise Goals    Row Name 09/20/16 1151             Exercise Goals   Increase Physical Activity Yes       Intervention Provide advice, education, support and counseling about physical activity/exercise needs.;Develop an individualized exercise prescription for aerobic and resistive training based on initial evaluation findings, risk stratification, comorbidities and participant's personal goals.       Expected Outcomes Achievement of increased cardiorespiratory fitness and enhanced flexibility, muscular endurance and strength shown through measurements of functional capacity and personal statement of participant.          Exercise Goals Re-Evaluation :     Exercise Goals Re-Evaluation    Row Name 11/09/16 1146 11/23/16 1117 12/07/16 1000         Exercise Goal Re-Evaluation   Exercise Goals Review Increase Physical Activity;Increase Strength and Stamina Knowledge and understanding of Target Heart Rate Range (THRR);Able to understand and use rate of perceived exertion (RPE)  scale Increase Physical Activity     Comments Pt has increased his home exercise to 30 minutes riding his stationary bike daily. Patient's walking is limited by chronic knee pain. Reviewed THRR and RPE scale. Patient is progressing well with exercise. Walking is limited by chronic knee pain, but pt has a staionary bike at home, which he is using for his home exercise [facility] Pt has been referred to a Parkinson's rehab program that he plans to participate in.     Expected Outcomes Continue riding stationary bike 30 minutes in addition to cardiac rehab. Continue riding stationary bike 30 minutes in addition to cardiac rehab. [facility] and riding stationary bike at home, 30 minutes, 3 days/week to help achieve health and fitness goals.         Discharge Exercise  Prescription (Final Exercise Prescription Changes):     Exercise Prescription Changes - 12/05/16 1500      Response to Exercise   Blood Pressure (Admit) 112/80   Blood Pressure (Exercise) 132/70   Blood Pressure (Exit) 120/70   Heart Rate (Admit) 64 bpm   Heart Rate (Exercise) 78 bpm   Heart Rate (Exit) 65 bpm   Rating of Perceived Exertion (Exercise) 12   Symptoms none   Duration Progress to 45 minutes of aerobic exercise without signs/symptoms of physical distress   Intensity THRR unchanged     Progression   Progression Continue to progress workloads to maintain intensity without signs/symptoms of physical distress.   Average METs 3.4     Resistance Training   Training Prescription Yes   Weight 4lbs   Reps 10-15   Time 10 Minutes     Interval Training   Interval Training No     Recumbant Bike   Level 5   Watts 56   Minutes 10   METs 3.87     NuStep   Level 4   SPM 85   Minutes 20   METs 2.8     Arm Ergometer   Level 4   Watts 45   Minutes 10   METs 3.55     Home Exercise Plan   Plans to continue exercise at Home (comment)   Frequency Add 4 additional days to program exercise sessions.   Initial Home Exercises Provided 11/04/16      Nutrition:  Target Goals: Understanding of nutrition guidelines, daily intake of sodium 1500mg , cholesterol 200mg , calories 30% from fat and 7% or less from saturated fats, daily to have 5 or more servings of fruits and vegetables.  Biometrics:     Pre Biometrics - 09/20/16 1125      Pre Biometrics   Height 5\' 9"  (1.753 m)   Waist Circumference 42.75 inches   Hip Circumference 42 inches   Waist to Hip Ratio 1.02 %   BMI (Calculated) 30.9   Triceps Skinfold 21 mm   % Body Fat 31.6 %   Grip Strength 33.5 kg   Flexibility 9 in   Single Leg Stand 2.14 seconds       Nutrition Therapy Plan and Nutrition Goals:     Nutrition Therapy & Goals - 09/28/16 1013      Nutrition Therapy   Diet Therapeutic Lifestyle  Changes     Personal Nutrition Goals   Nutrition Goal Pt to identify and limit food sources of saturated fat, trans fat, and sodium     Intervention Plan   Intervention Prescribe, educate and counsel  regarding individualized specific dietary modifications aiming towards targeted core components such as weight, hypertension, lipid management, diabetes, heart failure and other comorbidities.   Expected Outcomes Short Term Goal: Understand basic principles of dietary content, such as calories, fat, sodium, cholesterol and nutrients.;Long Term Goal: Adherence to prescribed nutrition plan.      Nutrition Discharge: Nutrition Scores:     Nutrition Assessments - 09/28/16 1012      MEDFICTS Scores   Pre Score 53      Nutrition Goals Re-Evaluation:   Nutrition Goals Re-Evaluation:   Nutrition Goals Discharge (Final Nutrition Goals Re-Evaluation):   Psychosocial: Target Goals: Acknowledge presence or absence of significant depression and/or stress, maximize coping skills, provide positive support system. Participant is able to verbalize types and ability to use techniques and skills needed for reducing stress and depression.  Initial Review & Psychosocial Screening:     Initial Psych Review & Screening - 09/20/16 1109      Initial Review   Current issues with --  health related anxiety   Source of Stress Concerns Chronic Illness;Unable to participate in former interests or hobbies;Unable to perform yard/household activities   Comments upon brief assessment, pt exhibits health related anxiety, otherwise no psychosocial needs identified, no interventions necessary      [place]? Yes  partner, Tamsen Snider     Barriers   Psychosocial barriers to participate in program The patient should benefit from training in stress management and relaxation.     Screening Interventions   Interventions Encouraged to exercise;Provide feedback about the scores to  participant      Quality of Life Scores:     Quality of Life - 09/20/16 1032      Quality of Life Scores   Health/Function Pre 14.4 %   Socioeconomic Pre 24 %   Psych/Spiritual Pre 27.6 %   Family Pre 30 %   GLOBAL Pre 20.4 %      PHQ-9: Recent Review Flowsheet Data    Depression screen [facility] 2/9 09/26/2016   Decreased Interest 0   Down, Depressed, Hopeless 0   PHQ - 2 Score 0     Interpretation of Total Score  Total Score Depression Severity:  1-4 = Minimal depression, 5-9 = Mild depression, 10-14 = Moderate depression, 15-19 = Moderately severe depression, 20-27 = Severe depression   Psychosocial Evaluation and Intervention:   Psychosocial Re-Evaluation:     Psychosocial Re-Evaluation    [place] Name 10/13/16 1131 11/09/16 1806 12/07/16 1635 12/07/16 1636       Psychosocial Re-Evaluation   Current issues with None Identified None Identified None Identified None Identified    Interventions Encouraged to attend Cardiac Rehabilitation for the exercise Encouraged to attend Cardiac Rehabilitation for the exercise Encouraged to attend Cardiac Rehabilitation for the exercise Encouraged to attend Cardiac Rehabilitation for the exercise    Continue Psychosocial Services  No Follow up required No Follow up required No Follow up required No Follow up required       Psychosocial Discharge (Final Psychosocial Re-Evaluation):     Psychosocial Re-Evaluation - 12/07/16 1636      Psychosocial Re-Evaluation   Current issues with None Identified   Interventions Encouraged to attend Cardiac Rehabilitation for the exercise   Continue Psychosocial Services  No Follow up required      Vocational Rehabilitation: Provide vocational rehab assistance to qualifying candidates.   Vocational Rehab Evaluation & Intervention:     Vocational Rehab - 09/20/16 1101  Initial Vocational Rehab Evaluation & Intervention   Assessment shows need for Vocational Rehabilitation No       Education: Education Goals: Education classes will be provided on a weekly basis, covering required topics. Participant will state understanding/return demonstration of topics presented.  Learning Barriers/Preferences:     Learning Barriers/Preferences - 09/20/16 1221      Learning Barriers/Preferences   Learning Barriers Sight   Learning Preferences None      Education Topics: Count Your Pulse:  -Group instruction provided by verbal instruction, demonstration, patient participation and written materials to support subject.  Instructors address importance of being able to find your pulse and how to count your pulse when at home without a heart monitor.  Patients get hands on experience counting their pulse with staff help and individually.   Heart Attack, Angina, and Risk Factor Modification:  -Group instruction provided by verbal instruction, video, and written materials to support subject.  Instructors address signs and symptoms of angina and heart attacks.    Also discuss risk factors for heart disease and how to make changes to improve heart health risk factors.   Functional Fitness:  -Group instruction provided by verbal instruction, demonstration, patient participation, and written materials to support subject.  Instructors address safety measures for doing things around the house.  Discuss how to get up and down off the floor, how to pick things up properly, how to safely get out of a chair without assistance, and balance training.   CARDIAC REHAB PHASE II EXERCISE from 12/07/2016 in [place]  Date  11/18/16  Instruction Review Code  2- meets goals/outcomes      Meditation and Mindfulness:  -Group instruction provided by verbal instruction, patient participation, and written materials to support subject.  Instructor addresses importance of mindfulness and meditation practice to help reduce stress and improve awareness.  Instructor also  leads participants through a meditation exercise.    CARDIAC REHAB PHASE II EXERCISE from 12/07/2016 in [place]  Date  10/12/16  Instruction Review Code  2- meets goals/outcomes      Stretching for Flexibility and Mobility:  -Group instruction provided by verbal instruction, patient participation, and written materials to support subject.  Instructors lead participants through series of stretches that are designed to increase flexibility thus improving mobility.  These stretches are additional exercise for major muscle groups that are typically performed during regular warm up and cool down.   CARDIAC REHAB PHASE II EXERCISE from 12/07/2016 in [place]  Date  10/21/16  Educator  EP  Instruction Review Code  2- meets goals/outcomes      Hands Only CPR:  -Group verbal, video, and participation provides a basic overview of AHA guidelines for community CPR. Role-play of emergencies allow participants the opportunity to practice calling for help and chest compression technique with discussion of AED use.   Hypertension: -Group verbal and written instruction that provides a basic overview of hypertension including the most recent diagnostic guidelines, risk factor reduction with self-care instructions and medication management.   CARDIAC REHAB PHASE II EXERCISE from 12/07/2016 in [place]  Date  10/07/16  Instruction Review Code  2- meets goals/outcomes       Nutrition I class: Heart Healthy Eating:  -Group instruction provided by PowerPoint slides, verbal discussion, and written materials to support subject matter. The instructor gives an explanation and review of the Therapeutic Lifestyle Changes diet recommendations, which  includes a discussion on lipid goals, dietary fat, sodium, fiber, plant stanol/sterol esters, sugar, and the components of a well-balanced, healthy diet.   CARDIAC  REHAB PHASE II EXERCISE from 12/07/2016 in [place]  Date  10/04/16  Educator  RD  Instruction Review Code  2- meets goals/outcomes      Nutrition II class: Lifestyle Skills:  -Group instruction provided by PowerPoint slides, verbal discussion, and written materials to support subject matter. The instructor gives an explanation and review of label reading, grocery shopping for heart health, heart healthy recipe modifications, and ways to make healthier choices when eating out.   CARDIAC REHAB PHASE II EXERCISE from 12/07/2016 in [place]  Date  10/10/16  Educator  RD  Instruction Review Code  Not applicable      Diabetes Question & Answer:  -Group instruction provided by PowerPoint slides, verbal discussion, and written materials to support subject matter. The instructor gives an explanation and review of diabetes co-morbidities, pre- and post-prandial blood glucose goals, pre-exercise blood glucose goals, signs, symptoms, and treatment of hypoglycemia and hyperglycemia, and foot care basics.   Diabetes Blitz:  -Group instruction provided by PowerPoint slides, verbal discussion, and written materials to support subject matter. The instructor gives an explanation and review of the physiology behind type 1 and type 2 diabetes, diabetes medications and rational behind using different medications, pre- and post-prandial blood glucose recommendations and Hemoglobin A1c goals, diabetes diet, and exercise including blood glucose guidelines for exercising safely.    Portion Distortion:  -Group instruction provided by PowerPoint slides, verbal discussion, written materials, and food models to support subject matter. The instructor gives an explanation of serving size versus portion size, changes in portions sizes over the last 20 years, and what consists of a serving from each food group.   CARDIAC REHAB PHASE II EXERCISE from  12/07/2016 in [place]  Date  10/19/16  Educator  RD  Instruction Review Code  2- meets goals/outcomes      Stress Management:  -Group instruction provided by verbal instruction, video, and written materials to support subject matter.  Instructors review role of stress in heart disease and how to cope with stress positively.     Exercising on Your Own:  -Group instruction provided by verbal instruction, power point, and written materials to support subject.  Instructors discuss benefits of exercise, components of exercise, frequency and intensity of exercise, and end points for exercise.  Also discuss use of nitroglycerin and activating EMS.  Review options of places to exercise outside of rehab.  Review guidelines for sex with heart disease.   CARDIAC REHAB PHASE II EXERCISE from 12/07/2016 in [place]  Date  11/02/16  Instruction Review Code  2- meets goals/outcomes      Cardiac Drugs I:  -Group instruction provided by verbal instruction and written materials to support subject.  Instructor reviews cardiac drug classes: antiplatelets, anticoagulants, beta blockers, and statins.  Instructor discusses reasons, side effects, and lifestyle considerations for each drug class.   CARDIAC REHAB PHASE II EXERCISE from 10/05/2016 in [place]  Date  10/05/16  Instruction Review Code  2- meets goals/outcomes      Cardiac Drugs II:  -Group instruction provided by verbal instruction and written materials to support subject.  Instructor reviews cardiac drug classes: angiotensin converting enzyme inhibitors (ACE-I), angiotensin II receptor blockers (ARBs), nitrates, and calcium channel blockers.  Instructor discusses reasons, side effects, and lifestyle considerations for each drug class.   CARDIAC REHAB PHASE II EXERCISE from 12/07/2016 in [place]  Date  12/07/16   Instruction Review Code  2- meets goals/outcomes      Anatomy and Physiology of the Circulatory System:  Group verbal and written instruction and models provide basic cardiac anatomy and physiology, with the coronary electrical and arterial systems. Review of: AMI, Angina, Valve disease, Heart Failure, Peripheral Artery Disease, Cardiac Arrhythmia, Pacemakers, and the ICD.   CARDIAC REHAB PHASE II EXERCISE from 12/07/2016 in [place]  Date  11/23/16  Instruction Review Code  2- meets goals/outcomes      Other Education:  -Group or individual verbal, written, or video instructions that support the educational goals of the cardiac rehab program.   Knowledge Questionnaire Score:     Knowledge Questionnaire Score - 09/20/16 1032      Knowledge Questionnaire Score   Pre Score 20/24      Core Components/Risk Factors/Patient Goals at Admission:     Personal Goals and Risk Factors at Admission - 09/20/16 1055      Core Components/Risk Factors/Patient Goals on Admission    Weight Management Yes;Weight Maintenance;Obesity;Weight Loss   Intervention Weight Management: Provide education and appropriate resources to help participant work on and attain dietary goals.;Weight Management: Develop a combined nutrition and exercise program designed to reach desired caloric intake, while maintaining appropriate intake of nutrient and fiber, sodium and fats, and appropriate energy expenditure required for the weight goal.;Weight Management/Obesity: Establish reasonable short term and long term weight goals.;Obesity: Provide education and appropriate resources to help participant work on and attain dietary goals.   Expected Outcomes Short Term: Continue to assess and modify interventions until short term weight is achieved;Long Term: Adherence to nutrition and physical activity/exercise program aimed toward attainment of established weight goal;Weight Maintenance:  Understanding of the daily nutrition guidelines, which includes 25-35% calories from fat, 7% or less cal from saturated fats, less than 200mg  cholesterol, less than 1.5gm of sodium, & 5 or more servings of fruits and vegetables daily;Weight Loss: Understanding of general recommendations for a balanced deficit meal plan, which promotes 1-2 lb weight loss per week and includes a negative energy balance of [phone] kcal/d;Understanding recommendations for meals to include 15-35% energy as protein, 25-35% energy from fat, 35-60% energy from carbohydrates, less than 200mg  of dietary cholesterol, 20-35 gm of total fiber daily;Understanding of distribution of calorie intake throughout the day with the consumption of 4-5 meals/snacks   Hypertension Yes   Intervention Provide education on lifestyle modifcations including regular physical activity/exercise, weight management, moderate sodium restriction and increased consumption of fresh fruit, vegetables, and low fat dairy, alcohol moderation, and smoking cessation.;Monitor prescription use compliance.   Expected Outcomes Short Term: Continued assessment and intervention until BP is < 140/54mm HG in hypertensive participants. < 130/46mm HG in hypertensive participants with diabetes, heart failure or chronic kidney disease.;Long Term: Maintenance of blood pressure at goal levels.   Lipids Yes   Intervention Provide education and support for participant on nutrition & aerobic/resistive exercise along with prescribed medications to achieve LDL 70mg , HDL >40mg .   Expected Outcomes Short Term: Participant states understanding of desired cholesterol values and is compliant with medications prescribed. Participant is following exercise prescription and nutrition guidelines.;Long Term: Cholesterol controlled with medications as prescribed, with individualized exercise RX and with personalized nutrition plan. Value goals: LDL < 70mg , HDL > 40 mg.  Stress Yes   Personal Goal  Other Yes   Personal Goal Be able to walk better. Modify risk factors for heart disease.   Intervention Develop individualized exercise prescription including walking for home to improve walking capacity and distanced as measured by 6 minute walk test. Provide education information regarding risk factor modification including heart healhty eating and home exercise program.   Expected Outcomes Participant states understanding of what risk factors are modifiable. Participant follows heart healhty diet and follows exercise prescription guidelines.      Core Components/Risk Factors/Patient Goals Review:      Goals and Risk Factor Review    Row Name 11/09/16 1804 12/07/16 1635           Core Components/Risk Factors/Patient Goals Review   Personal Goals Review Weight Management/Obesity;Hypertension;Lipids Weight Management/Obesity;Hypertension;Lipids      Review [name] vital signs have been stable at cardiac rehab.  [name] vital signs have been stable at cardiac rehab.       Expected Outcomes [name] will continue to have a good response to exercise and take medications as prescribed [name] will continue to have a good response to exercise and take medications as prescribed         Core Components/Risk Factors/Patient Goals at Discharge (Final Review):      Goals and Risk Factor Review - 12/07/16 1635      Core Components/Risk Factors/Patient Goals Review   Personal Goals Review Weight Management/Obesity;Hypertension;Lipids   Review [name] vital signs have been stable at cardiac rehab.    Expected Outcomes [name] will continue to have a good response to exercise and take medications as prescribed      ITP Comments:     ITP Comments    Row Name 09/20/16 1054           ITP Comments DR Fransico Him, MEDICAL DIRECTOR           Comments: [name] is making expected progress toward personal goals after completing 31 sessions. Recommend continued exercise and life style modification education including   stress management and relaxation techniques to decrease cardiac risk profile. [name] is doing well with exercise at cardiac rehab.Barnet Pall, RN,BSN 12/07/2016 4:42 PM

## 2016-12-09 ENCOUNTER — Encounter (HOSPITAL_COMMUNITY): Payer: Medicare Other

## 2016-12-09 ENCOUNTER — Encounter (HOSPITAL_COMMUNITY)
Admission: RE | Admit: 2016-12-09 | Discharge: 2016-12-09 | Disposition: A | Discharge status: Home / Self Care | Payer: Medicare Other | Source: Ambulatory Visit | Attending: Cardiology | Admitting: Cardiology

## 2016-12-09 DIAGNOSIS — Z955 Presence of coronary angioplasty implant and graft: Secondary | ICD-10-CM | POA: Diagnosis not present

## 2016-12-12 ENCOUNTER — Encounter (HOSPITAL_COMMUNITY): Payer: Medicare Other

## 2016-12-12 ENCOUNTER — Encounter (HOSPITAL_COMMUNITY)
Admission: RE | Admit: 2016-12-12 | Discharge: 2016-12-12 | Disposition: A | Discharge status: Home / Self Care | Payer: Medicare Other | Source: Ambulatory Visit | Attending: Cardiology | Admitting: Cardiology

## 2016-12-12 DIAGNOSIS — Z955 Presence of coronary angioplasty implant and graft: Secondary | ICD-10-CM

## 2016-12-14 ENCOUNTER — Encounter (HOSPITAL_COMMUNITY)
Admission: RE | Admit: 2016-12-14 | Discharge: 2016-12-14 | Disposition: A | Discharge status: Home / Self Care | Payer: Medicare Other | Source: Ambulatory Visit | Attending: Cardiology | Admitting: Cardiology

## 2016-12-14 ENCOUNTER — Encounter (HOSPITAL_COMMUNITY): Payer: Medicare Other

## 2016-12-14 DIAGNOSIS — Z955 Presence of coronary angioplasty implant and graft: Secondary | ICD-10-CM

## 2016-12-15 DIAGNOSIS — H353121 Nonexudative age-related macular degeneration, left eye, early dry stage: Secondary | ICD-10-CM | POA: Diagnosis not present

## 2016-12-15 NOTE — Progress Notes (Signed)
[name] was seen today in the movement disorders clinic for neurologic consultation at the request of Marchelle Gearing, MD.  This patient is accompanied in the office by his partner who supplements the history.  The consultation is for the evaluation of gait abnormality and to r/o PD.  The records that were made available to me were reviewed.  12/16/16 update:  Patient is seen today in follow-up for newly diagnosed Parkinson's disease.  This patient is accompanied in the office by his spouse who supplements the history.  Patient was started on levodopa last visit.  He states that he is doing better.  His tremor is better.  Lip tremor has resolved.  No SE.  He is taking carbidopa/levodopa 25/100 at 7am/11am/4pm.    No falls.    I did refer him to Parkinson's rehabilitation, but he opted to hold on that until he finishes cardiac rehabilitation.   Sees Dr. Nadyne Coombes.  PREVIOUS MEDICATIONS: none to date  ALLERGIES:  No Known Allergies  CURRENT MEDICATIONS:  Outpatient Encounter Prescriptions as of 12/16/2016  Medication Sig  . aspirin EC 81 MG tablet Take 81 mg by mouth daily.  . carbidopa-levodopa (SINEMET IR) 25-100 MG tablet TAKE 1 TABLET THREE TIMES DAILY  . cholecalciferol (VITAMIN D) 1000 units tablet Take 1,000 Units by mouth daily.  . clopidogrel (PLAVIX) 75 MG tablet Take 75 mg by mouth daily.  [name] losartan (COZAAR) 50 MG tablet Take 50 mg by mouth daily.  . metoprolol succinate (TOPROL-XL) 100 MG 24 hr tablet Take 100 mg by mouth daily. Take with or immediately following a meal.  . Multiple Vitamins-Minerals (PRESERVISION AREDS) CAPS Take 1 capsule by mouth 2 (two) times daily.  . rosuvastatin (CRESTOR) 10 MG tablet Take 10 mg by mouth at bedtime.  . tamsulosin (FLOMAX) 0.4 MG CAPS capsule Take 0.4 mg by mouth at bedtime.  . triamterene-hydrochlorothiazide (MAXZIDE-25) 37.5-25 MG tablet Take 1 tablet by mouth daily.  . vitamin E 400 UNIT capsule Take 400 Units by mouth  daily.   No facility-administered encounter medications on file as of 12/16/2016.     PAST MEDICAL HISTORY:   Past Medical History:  Diagnosis Date  . Arthritis    "knees; lower back" (08/09/2016)  . Atrial fibrillation ([place])    remote hx/notes 08/07/2016  . Episodes of trembling    "most of the time it's my left hand" (08/09/2016)  . High cholesterol   . History of kidney stones   . Hypertension   . Melanoma of shoulder ([place]) 2000s   "left"  . Mitral regurgitation and mitral stenosis    hx/notes 08/07/2016  . Moderate aortic stenosis    hx/notes 08/07/2016  . Prostate cancer ([place]) 2011   hx/notes 08/07/2016    PAST SURGICAL HISTORY:   Past Surgical History:  Procedure Laterality Date  . CARDIAC CATHETERIZATION  ~ [address], [place]"  . CATARACT EXTRACTION W/ INTRAOCULAR LENS  IMPLANT, BILATERAL Bilateral 2014-02/2016   left-right; hx/notes 08/07/2016  . CORONARY ANGIOPLASTY WITH STENT PLACEMENT  08/09/2016  . CORONARY ATHERECTOMY N/A 08/09/2016   Procedure: Coronary Atherectomy;  Surgeon: Adrian Prows, MD;  Location: [facility];  Service: Cardiovascular;  Laterality: N/A;  . CORONARY STENT INTERVENTION N/A 08/09/2016   Procedure: Coronary Stent Intervention;  Surgeon: Adrian Prows, MD;  Location: [facility];  Service: Cardiovascular;  Laterality: N/A;  LAD  . EYE SURGERY Bilateral 05/2016   "laser on both lenses"  . INGUINAL HERNIA  REPAIR Left 1991  . INSERTION PROSTATE RADIATION SEED  06/25/2009  . INTRAVASCULAR PRESSURE WIRE/FFR STUDY N/A 08/09/2016   Procedure: Intravascular Pressure Wire/FFR Study;  Surgeon: Adrian Prows, MD;  Location: [facility];  Service: Cardiovascular;  Laterality: N/A;  . LAPAROSCOPIC CHOLECYSTECTOMY  1993  . MELANOMA EXCISION Left 1990s   "shoulder"  . RIGHT/LEFT HEART CATH AND CORONARY ANGIOGRAPHY N/A 08/09/2016   Procedure: Right/Left Heart Cath and Coronary Angiography;  Surgeon: Adrian Prows, MD;  Location: [facility];   Service: Cardiovascular;  Laterality: N/A;    SOCIAL HISTORY:   Social History   Social History  . Marital status: Divorced    Spouse name: N/A  . Number of children: N/A  . Years of education: N/A   Occupational History  . retired     food business - Biochemist, clinical   Social History Main Topics  . Smoking status: Former Smoker    Years: 0.20    Quit date: 60  . Smokeless tobacco: Never Used     Comment: "smoked  2 months when I was 20; non since" (08/09/2016)  . Alcohol use 8.4 oz/week    14 Shots of liquor per week     Comment: 2 drinks per day (can[place] mist)  . Drug use: No  . Sexual activity: Not Currently   Other Topics Concern  . Not on file   Social History Narrative  . No narrative on file    FAMILY HISTORY:   Family Status  Relation Status  . Mother Deceased  . Father Deceased  . Sister Deceased  . Brother Alive  . Daughter Alive    ROS:  A complete 10 system review of systems was obtained and was unremarkable apart from what is mentioned above.  PHYSICAL EXAMINATION:    VITALS:   Vitals:   12/16/16 0905  BP: 140/66  Pulse: 64  SpO2: 99%  Weight: 208 lb (94.3 kg)  Height: 5' 10.5" (1.791 m)    GEN:  The patient appears stated age and is in NAD. HEENT:  Normocephalic, atraumatic.  The mucous membranes are moist. The superficial temporal arteries are without ropiness or tenderness. CV:  RRR Lungs:  CTAB.  He has DOE.   Neck/HEME:  There are no carotid bruits bilaterally.  Neurological examination:  Orientation: The patient is alert and oriented x3.  Cranial nerves: There is good facial symmetry. Extraocular muscles are intact with the exception of R esotropia (hx of lazy eye as a kid). The visual fields are full to confrontational testing. The speech is fluent and clear. Soft palate rises symmetrically and there is no tongue deviation. Hearing is intact to conversational tone. Sensation: Sensation is intact to light touch x 4 Motor: Strength is  at least antigravity x 4  Movement examination: Tone: There is mild increased tone in the UE bilaterally Abnormal movements: There is very mild tremor in the fingers, L more than R Coordination:  There is decremation with  alternation of supination/pronation of the forearm on the L and mild with foot taps bilaterally Gait and Station: The patient has no trouble getting out of chair.  Just slight antalgic gait due to knee pain  Labs:  Patient had lab work done on 08/04/2016.  Sodium was 138, potassium 4.8, chloride 94, CO2 26, BUN 26, creatinine 1.39, glucose 101.  White blood cells were 3.9, hemoglobin 14.1, hematocrit 43.4 and platelets 204.  Lab Results  Component Value Date   TSH 2.94 09/15/2016  ASSESSMENT/PLAN:  1.  Idiopathic Parkinson's disease.  The patient has tremor, bradykinesia, rigidity and postural instability.  -We discussed the diagnosis as well as pathophysiology of the disease.  We discussed treatment options as well as prognostic indicators.  Patient education was provided.  -We discussed that it used to be thought that levodopa would increase risk of melanoma but now it is believed that Parkinsons itself likely increases risk of melanoma. he is to get regular skin checks.  He sees Dr. Jarome Matin next month.  -He will continue carbidopa/levodopa 25/100, one tablet 3 times a day.  -discussed RSB and [facility] cycle program  -he is finishing with cardiac rehab and talked about enrolling in PD rehab after and talked about purpose of that.    2.  Follow up is anticipated in the next few months, sooner should new neurologic issues arise.  Much greater than 50% of this visit was spent in counseling and coordinating care.  Total face to face time:  25 min    Cc:  Via, Lennette Bihari, MD

## 2016-12-16 ENCOUNTER — Encounter (HOSPITAL_COMMUNITY): Payer: Medicare Other

## 2016-12-16 ENCOUNTER — Encounter: Payer: Self-pay | Admitting: Neurology

## 2016-12-16 ENCOUNTER — Encounter (HOSPITAL_COMMUNITY)
Admission: RE | Admit: 2016-12-16 | Discharge: 2016-12-16 | Disposition: A | Discharge status: Home / Self Care | Payer: Medicare Other | Source: Ambulatory Visit | Attending: Cardiology | Admitting: Cardiology

## 2016-12-16 ENCOUNTER — Ambulatory Visit (INDEPENDENT_AMBULATORY_CARE_PROVIDER_SITE_OTHER): Payer: Medicare Other | Admitting: Neurology

## 2016-12-16 VITALS — BP 140/66 | HR 64 | Ht 70.5 in | Wt 208.0 lb

## 2016-12-16 DIAGNOSIS — Z955 Presence of coronary angioplasty implant and graft: Secondary | ICD-10-CM

## 2016-12-16 DIAGNOSIS — G20 Parkinson's disease: Secondary | ICD-10-CM

## 2016-12-19 ENCOUNTER — Encounter (HOSPITAL_COMMUNITY)
Admission: RE | Admit: 2016-12-19 | Discharge: 2016-12-19 | Disposition: A | Discharge status: Home / Self Care | Payer: Medicare Other | Source: Ambulatory Visit | Attending: Cardiology | Admitting: Cardiology

## 2016-12-19 ENCOUNTER — Encounter (HOSPITAL_COMMUNITY): Payer: Medicare Other

## 2016-12-19 VITALS — BP 110/70 | HR 62 | Ht 69.0 in | Wt 206.4 lb

## 2016-12-19 DIAGNOSIS — Z955 Presence of coronary angioplasty implant and graft: Secondary | ICD-10-CM | POA: Diagnosis not present

## 2016-12-19 NOTE — Progress Notes (Signed)
Discharge Progress Report  Patient Details  Name: [name] MRN: [id number] Date of Birth: Jun 04, 1935 Referring Provider:     CARDIAC REHAB PHASE II ORIENTATION from 09/20/2016 in [facility]  Referring Provider  Adrian Prows, MD.       Number of Visits: 73  Reason for Discharge:  Patient independent in their exercise.  Smoking History:  History  Smoking Status  . Former Smoker  . Years: 0.20  . Quit date: 1958  Smokeless Tobacco  . Never Used    Comment: "smoked  2 months when I was 19; non since" (08/09/2016)    Diagnosis:  08/09/16 Stented coronary artery  ADL UCSD:   Initial Exercise Prescription:     Initial Exercise Prescription - 09/20/16 1100      Date of Initial Exercise RX and Referring Provider   Date 09/20/16   Referring Provider Adrian Prows, MD.     Bike   Level 0.5   Minutes 10   METs 2     NuStep   Level 2   SPM 85   Minutes 10   METs 2.2     Track   Laps 7   Minutes 10   METs 2.23     Prescription Details   Frequency (times per week) 3   Duration Progress to 30 minutes of continuous aerobic without signs/symptoms of physical distress     Intensity   THRR 40-80% of Max Heartrate 57-114   Ratings of Perceived Exertion 11-13   Perceived Dyspnea 0-4     Progression   Progression Continue to progress workloads to maintain intensity without signs/symptoms of physical distress.     Resistance Training   Training Prescription Yes   Weight 2lb wts   Reps 10-15      Discharge Exercise Prescription (Final Exercise Prescription Changes):     Exercise Prescription Changes - 12/19/16 1049      Response to Exercise   Blood Pressure (Admit) 110/70   Blood Pressure (Exercise) 122/70   Blood Pressure (Exit) 138/70   Heart Rate (Admit) 62 bpm   Heart Rate (Exercise) 78 bpm   Heart Rate (Exit) 60 bpm   Rating of Perceived Exertion (Exercise) 12   Symptoms none   Duration Progress to 45  minutes of aerobic exercise without signs/symptoms of physical distress   Intensity THRR unchanged     Progression   Progression Continue to progress workloads to maintain intensity without signs/symptoms of physical distress.   Average METs 3.4     Resistance Training   Training Prescription Yes   Weight 4lbs   Reps 10-15   Time 10 Minutes     Interval Training   Interval Training No     Recumbant Bike   Level 5   Watts 59   Minutes 10   METs 4.05     NuStep   Level 4   SPM 85   Minutes 10   METs 2.5     Arm Ergometer   Level 4   Watts 44   Minutes 10   METs 3.51     Home Exercise Plan   Plans to continue exercise at Home (comment)   Frequency Add 4 additional days to program exercise sessions.   Initial Home Exercises Provided 11/04/16      Functional Capacity:     6 Minute Walk    Row Name 09/20/16 1148 12/12/16 0959       6 Minute Walk  Phase Initial Discharge    Distance 1206 feet 1206 feet    Distance % Change  - 10.28 %    Walk Time 6 minutes 6 minutes    # of Rest Breaks 0 0    MPH 2.28 2.05    METS 1.96 1.87    RPE 13 12    VO2 Peak 6.85 6.53    Symptoms Yes (comment) Yes (comment)    Comments Patient used wheel chair after 2 minutes due to knee pain. Patient used rollator throughout. Pt c/o bilateral knee pain 5/10 on pain scale.    Resting HR 57 bpm 67 bpm    Resting BP 140/80 114/72    Max Ex. HR 87 bpm 87 bpm    Max Ex. BP 124/62 142/80    2 Minute Post BP 118/68  -       Psychological, QOL, Others - Outcomes: PHQ 2/9: Depression screen [facility] 2/9 12/19/2016 09/26/2016  Decreased Interest 0 0  Down, Depressed, Hopeless 0 0  PHQ - 2 Score 0 0    Quality of Life:     Quality of Life - 12/14/16 1159      Quality of Life Scores   Health/Function Pre 14.4 %   Health/Function Post 24.86 %   Health/Function % Change 72.64 %   Socioeconomic Pre 24 %   Socioeconomic Post 28.8 %   Socioeconomic % Change  20 %   Psych/Spiritual  Pre 27.6 %   Psych/Spiritual Post 27.43 %   Psych/Spiritual % Change -0.62 %   Family Pre 30 %   Family Post 30 %   Family % Change 0 %   GLOBAL Pre 20.4 %   GLOBAL Post 26.69 %   GLOBAL % Change 30.83 %      Personal Goals: Goals established at orientation with interventions provided to work toward goal.     Personal Goals and Risk Factors at Admission - 09/20/16 1055      Core Components/Risk Factors/Patient Goals on Admission    Weight Management Yes;Weight Maintenance;Obesity;Weight Loss   Intervention Weight Management: Provide education and appropriate resources to help participant work on and attain dietary goals.;Weight Management: Develop a combined nutrition and exercise program designed to reach desired caloric intake, while maintaining appropriate intake of nutrient and fiber, sodium and fats, and appropriate energy expenditure required for the weight goal.;Weight Management/Obesity: Establish reasonable short term and long term weight goals.;Obesity: Provide education and appropriate resources to help participant work on and attain dietary goals.   Expected Outcomes Short Term: Continue to assess and modify interventions until short term weight is achieved;Long Term: Adherence to nutrition and physical activity/exercise program aimed toward attainment of established weight goal;Weight Maintenance: Understanding of the daily nutrition guidelines, which includes 25-35% calories from fat, 7% or less cal from saturated fats, less than 257m cholesterol, less than 1.5gm of sodium, & 5 or more servings of fruits and vegetables daily;Weight Loss: Understanding of general recommendations for a balanced deficit meal plan, which promotes 1-2 lb weight loss per week and includes a negative energy balance of [phone] kcal/d;Understanding recommendations for meals to include 15-35% energy as protein, 25-35% energy from fat, 35-60% energy from carbohydrates, less than 2063mof dietary  cholesterol, 20-35 gm of total fiber daily;Understanding of distribution of calorie intake throughout the day with the consumption of 4-5 meals/snacks   Hypertension Yes   Intervention Provide education on lifestyle modifcations including regular physical activity/exercise, weight management, moderate sodium restriction and increased consumption of  fresh fruit, vegetables, and low fat dairy, alcohol moderation, and smoking cessation.;Monitor prescription use compliance.   Expected Outcomes Short Term: Continued assessment and intervention until BP is < 140/16m HG in hypertensive participants. < 130/813mHG in hypertensive participants with diabetes, heart failure or chronic kidney disease.;Long Term: Maintenance of blood pressure at goal levels.   Lipids Yes   Intervention Provide education and support for participant on nutrition & aerobic/resistive exercise along with prescribed medications to achieve LDL <7049mHDL >61m46m Expected Outcomes Short Term: Participant states understanding of desired cholesterol values and is compliant with medications prescribed. Participant is following exercise prescription and nutrition guidelines.;Long Term: Cholesterol controlled with medications as prescribed, with individualized exercise RX and with personalized nutrition plan. Value goals: LDL < 70mg62mL > 40 mg.   Stress Yes   Personal Goal Other Yes   Personal Goal Be able to walk better. Modify risk factors for heart disease.   Intervention Develop individualized exercise prescription including walking for home to improve walking capacity and distanced as measured by 6 minute walk test. Provide education information regarding risk factor modification including heart healhty eating and home exercise program.   Expected Outcomes Participant states understanding of what risk factors are modifiable. Participant follows heart healhty diet and follows exercise prescription guidelines.       Personal Goals  Discharge:     Goals and Risk Factor Review    Row Name 11/09/16 1804 12/07/16 1635 12/19/16 1047 12/23/16 1133       Core Components/Risk Factors/Patient Goals Review   Personal Goals Review Weight Management/Obesity;Hypertension;Lipids Weight Management/Obesity;Hypertension;Lipids Weight Management/Obesity;Hypertension;Lipids Weight Management/Obesity    Review [name] vital signs have been stable at cardiac rehab.  [name] vital signs have been stable at cardiac rehab.  [name] vital signs have been stable at cardiac rehab.  Pt wt is down ~ 2 lb. Wt loss goal of 6-24 lb at graduation from rehab not met.     Expected Outcomes [name] will continue to have a good response to exercise and take medications as prescribed [name] will continue to have a good response to exercise and take medications as prescribed [name] did well with exercise at cardiac rehab. [name] will continue to take his meds for HTN and hyperlipedeima as presribed Continue to encourage slow wt loss of 1-2 lb/week.        Exercise Goals and Review:     Exercise Goals    Row Name 09/20/16 1151             Exercise Goals   Increase Physical Activity Yes       Intervention Provide advice, education, support and counseling about physical activity/exercise needs.;Develop an individualized exercise prescription for aerobic and resistive training based on initial evaluation findings, risk stratification, comorbidities and participant's personal goals.       Expected Outcomes Achievement of increased cardiorespiratory fitness and enhanced flexibility, muscular endurance and strength shown through measurements of functional capacity and personal statement of participant.          Nutrition & Weight - Outcomes:     Pre Biometrics - 09/20/16 1125      Pre Biometrics   Height 5' 9" (1.753 m)   Waist Circumference 42.75 inches   Hip Circumference 42 inches   Waist to Hip Ratio 1.02 %   BMI (Calculated) 30.9   Triceps Skinfold 21 mm   % Body  Fat 31.6 %   Grip Strength 33.5 kg   Flexibility 9 in  Single Leg Stand 2.14 seconds         Post Biometrics - 12/19/16 1049       Post  Biometrics   Height 5' 9" (1.753 m)   Weight 206 lb 5.6 oz (93.6 kg)   Waist Circumference 43.75 inches   Hip Circumference 43 inches   Waist to Hip Ratio 1.02 %   BMI (Calculated) 30.46   Triceps Skinfold 19 mm   % Body Fat 31.6 %   Grip Strength 36 kg   Flexibility --  Not performed due to knee pain. Legs not completely straight at entry as well.   Single Leg Stand 4.75 seconds      Nutrition:     Nutrition Therapy & Goals - 09/28/16 1013      Nutrition Therapy   Diet Therapeutic Lifestyle Changes     Personal Nutrition Goals   Nutrition Goal Pt to identify and limit food sources of saturated fat, trans fat, and sodium     Intervention Plan   Intervention Prescribe, educate and counsel regarding individualized specific dietary modifications aiming towards targeted core components such as weight, hypertension, lipid management, diabetes, heart failure and other comorbidities.   Expected Outcomes Short Term Goal: Understand basic principles of dietary content, such as calories, fat, sodium, cholesterol and nutrients.;Long Term Goal: Adherence to prescribed nutrition plan.      Nutrition Discharge:     Nutrition Assessments - 12/14/16 0916      MEDFICTS Scores   Pre Score 53   Post Score 48   Score Difference -5      Education Questionnaire Score:     Knowledge Questionnaire Score - 12/14/16 1159      Knowledge Questionnaire Score   Pre Score 20/24   Post Score 22/24      Goals reviewed with patient; copy given to patient.[name] graduated from cardiac rehab program today with completion of 36 exercise sessions in Phase II. Pt maintained good attendance and progressed nicely during his participation in rehab. [name] met level stayed about the same during his participation in the program. Medication list reconciled. Repeat   PHQ score- 0 .  Pt has made significant lifestyle changes and should be commended for his success. Pt feels he has achieved his goals during cardiac rehab.   [facility] in Parkinson's rehab.[name] says he feels stronger since he has been participating in the program. Venetia Maxon, RN,BSN 12/27/2016 2:37 PM

## 2016-12-21 ENCOUNTER — Encounter (HOSPITAL_COMMUNITY): Payer: Medicare Other

## 2016-12-21 NOTE — Progress Notes (Signed)
[facility] Report   Patient Details  Name: [name]) [name] MRN: [id number] Date of Birth: 05-01-1935 Age: 81 y.o. PCP: Dineen Kid, MD  Vitals:   12/21/16 1323  BP: 122/76  Pulse: (!) 59  Resp: 18  SpO2: 97%  Weight: 205 lb 9.6 oz (93.3 kg)         [facility] Eval - 12/21/16 1300      Referral    Referring Provider cardiac rehab   Reason for referral High Cholesterol;Hypertension;Orthopedic   Program Start Date 12/21/16     Measurement   Neck measurement 16.75 Inches   Waist Circumference 42 inches   Body fat 34.1 percent     Information for Trainer   Goals "more stamina, leg strength to support both knees and core strength for back pain"   Current Exercise just finished cardiac rehab on 10/22, Rides stationary bike at home @ 45mins/day, and other exercise at home.   Orthopedic Concerns back, bilat knees, neck   Pertinent Medical History parkinsons, cardiac stents, htn, cholesterol   Current Barriers none   Restrictions/Precautions Assistive device  cane for long distances   Medications that affect exercise Beta blocker     Timed Up and Go (TUGS)   Timed Up and Go Moderate risk 10-12 seconds  12.86     Mobility and Daily Activities   I find it easy to walk up or down two or more flights of stairs. 1   I have no trouble taking out the trash. 4   I do housework such as vacuuming and dusting on my own without difficulty. 4   I can easily lift a gallon of milk (8lbs). 4   I can easily walk a mile. 1   I have no trouble reaching into high cupboards or reaching down to pick up something from the floor. 4   I do not have trouble doing out-door work such as Armed forces logistics/support/administrative officer, raking leaves, or gardening. 2     Mobility and Daily Activities   I feel younger than my age. 2   I feel independent. 4   I feel energetic. 2   I live an active life.  2   I feel strong. 2   I feel healthy. 2   I feel active as other people my age. 2     How fit  and strong are you.   Fit and Strong Total Score 36     Past Medical History:  Diagnosis Date  . Arthritis    "knees; lower back" (08/09/2016)  . Atrial fibrillation ([place])    remote hx/notes 08/07/2016  . Episodes of trembling    "most of the time it's my left hand" (08/09/2016)  . High cholesterol   . History of kidney stones   . Hypertension   . Melanoma of shoulder ([place]) 2000s   "left"  . Mitral regurgitation and mitral stenosis    hx/notes 08/07/2016  . Moderate aortic stenosis    hx/notes 08/07/2016  . Prostate cancer ([place]) 2011   hx/notes 08/07/2016   Past Surgical History:  Procedure Laterality Date  . CARDIAC CATHETERIZATION  ~ [address], [place]"  . CATARACT EXTRACTION W/ INTRAOCULAR LENS  IMPLANT, BILATERAL Bilateral 2014-02/2016   left-right; hx/notes 08/07/2016  . CORONARY ANGIOPLASTY WITH STENT PLACEMENT  08/09/2016  . CORONARY ATHERECTOMY N/A 08/09/2016   Procedure: Coronary Atherectomy;  Surgeon: Adrian Prows, MD;  Location: [place] [facility];  Service: Cardiovascular;  Laterality:  N/A;  . CORONARY STENT INTERVENTION N/A 08/09/2016   Procedure: Coronary Stent Intervention;  Surgeon: Adrian Prows, MD;  Location: [place];  Service: Cardiovascular;  Laterality: N/A;  LAD  . EYE SURGERY Bilateral 05/2016   "laser on both lenses"  . INGUINAL HERNIA REPAIR Left 1991  . INSERTION PROSTATE RADIATION SEED  06/25/2009  . INTRAVASCULAR PRESSURE WIRE/FFR STUDY N/A 08/09/2016   Procedure: Intravascular Pressure Wire/FFR Study;  Surgeon: Adrian Prows, MD;  Location: [facility];  Service: Cardiovascular;  Laterality: N/A;  . LAPAROSCOPIC CHOLECYSTECTOMY  1993  . MELANOMA EXCISION Left 1990s   "shoulder"  . RIGHT/LEFT HEART CATH AND CORONARY ANGIOGRAPHY N/A 08/09/2016   Procedure: Right/Left Heart Cath and Coronary Angiography;  Surgeon: Adrian Prows, MD;  Location: [facility];  Service: Cardiovascular;  Laterality: N/A;   History  Smoking Status  . Former  Smoker  . Years: 0.20  . Quit date: 1958  Smokeless Tobacco  . Never Used    Comment: "smoked  2 months when I was 60; non since" (08/09/2016)     [name] 12/21/2016, 1:28 PM

## 2016-12-23 ENCOUNTER — Encounter (HOSPITAL_COMMUNITY): Payer: Medicare Other

## 2016-12-26 ENCOUNTER — Encounter (HOSPITAL_COMMUNITY): Payer: Medicare Other

## 2016-12-26 NOTE — Progress Notes (Signed)
Pt received flu shot to RT deltoid.   Lot # [id number] Mfg: [employer] NDC: [phone] Expires 08/27/17 Consent obtained and copy given to patient

## 2016-12-26 NOTE — Progress Notes (Signed)
[facility] PREP Weekly Session   Patient Details  Name: Valgene [facility]) Denson Niccoli MRN: [id number] Date of Birth: 02/03/1936 Age: 81 y.o. PCP: Dineen Kid, MD  Vitals:   12/21/16 7897  Weight: 205 lb (93 kg)        [facility] Weekly seesion - 12/26/16 0900      Weekly Session   Topic Discussed Calorie breakdown   Minutes exercised this week 40 minutes  "30bike/10kettle ball"   Classes attended to date 1     Thing you are grateful for:"family + health" Nutrition celebrations:"butter finger 1 bar/week" Barriers:"neck, heart, knees, lower back"  [name] 12/26/2016, 9:29 AM

## 2016-12-28 ENCOUNTER — Encounter (HOSPITAL_COMMUNITY): Payer: Medicare Other

## 2016-12-30 NOTE — Progress Notes (Signed)
[facility] PREP Weekly Session   Patient Details  Name: [facility]) Andrian Sabala MRN: [id number] Date of Birth: [birth year] Age: 81 y.o. PCP: Dineen Kid, MD  Vitals:   12/28/16 1114  Weight: 200 lb (90.7 kg)        [facility] Weekly seesion - 12/30/16 1100      Weekly Session   Topic Discussed Hitting roadblocks   Minutes exercised this week 100 minutes  cardio   Classes attended to date 2     Fun things you did since last meeting:"cardiac rehab grad" Things you are grateful for:"everything" Nutrition celebration for the week:"cupcake" Barriers:"back, neck, legs"   [name] 12/30/2016, 11:15 AM

## 2017-01-06 NOTE — Progress Notes (Signed)
[facility] PREP Weekly Session   Patient Details  Name: [name] [facility]) [name] MRN: [id number] Date of Birth: [birth year] Age: 81 y.o. PCP: Dineen Kid, MD  Vitals:   01/04/17 1259  Weight: 206 lb (93.4 kg)    [facility] Weekly seesion - 01/06/17 1200      Weekly Session   Topic Discussed  Water    Minutes exercised this week  -- "good" to all categories   "good" to all categories   Classes attended to date  3      Fun things you did since last meeting:"home exercise"  Things you age grateful for:"everything" Barriers:"legs, back, neck"   [name] 01/06/2017, 1:03 PM

## 2017-01-24 ENCOUNTER — Other Ambulatory Visit: Payer: Self-pay

## 2017-01-24 ENCOUNTER — Encounter (HOSPITAL_COMMUNITY)
Admission: EM | Disposition: A | Discharge status: Home / Self Care | Payer: Self-pay | Source: Home / Self Care | Attending: Cardiology

## 2017-01-24 ENCOUNTER — Observation Stay (HOSPITAL_COMMUNITY): Payer: Medicare Other

## 2017-01-24 ENCOUNTER — Inpatient Hospital Stay (HOSPITAL_COMMUNITY)
Admission: EM | Admit: 2017-01-24 | Discharge: 2017-01-27 | DRG: 309 | Disposition: A | Discharge status: Home / Self Care | Payer: Medicare Other | Attending: Cardiology | Admitting: Cardiology

## 2017-01-24 ENCOUNTER — Encounter (HOSPITAL_COMMUNITY): Payer: Self-pay

## 2017-01-24 ENCOUNTER — Emergency Department (HOSPITAL_COMMUNITY): Payer: Medicare Other

## 2017-01-24 DIAGNOSIS — R072 Precordial pain: Secondary | ICD-10-CM

## 2017-01-24 DIAGNOSIS — I471 Supraventricular tachycardia: Secondary | ICD-10-CM | POA: Diagnosis present

## 2017-01-24 DIAGNOSIS — Z923 Personal history of irradiation: Secondary | ICD-10-CM

## 2017-01-24 DIAGNOSIS — I4892 Unspecified atrial flutter: Principal | ICD-10-CM | POA: Diagnosis present

## 2017-01-24 DIAGNOSIS — I350 Nonrheumatic aortic (valve) stenosis: Secondary | ICD-10-CM | POA: Diagnosis not present

## 2017-01-24 DIAGNOSIS — I2510 Atherosclerotic heart disease of native coronary artery without angina pectoris: Secondary | ICD-10-CM | POA: Diagnosis present

## 2017-01-24 DIAGNOSIS — Z955 Presence of coronary angioplasty implant and graft: Secondary | ICD-10-CM

## 2017-01-24 DIAGNOSIS — I083 Combined rheumatic disorders of mitral, aortic and tricuspid valves: Secondary | ICD-10-CM | POA: Diagnosis present

## 2017-01-24 DIAGNOSIS — Z8249 Family history of ischemic heart disease and other diseases of the circulatory system: Secondary | ICD-10-CM

## 2017-01-24 DIAGNOSIS — I129 Hypertensive chronic kidney disease with stage 1 through stage 4 chronic kidney disease, or unspecified chronic kidney disease: Secondary | ICD-10-CM | POA: Diagnosis not present

## 2017-01-24 DIAGNOSIS — Z7902 Long term (current) use of antithrombotics/antiplatelets: Secondary | ICD-10-CM

## 2017-01-24 DIAGNOSIS — N179 Acute kidney failure, unspecified: Secondary | ICD-10-CM | POA: Diagnosis present

## 2017-01-24 DIAGNOSIS — Z87891 Personal history of nicotine dependence: Secondary | ICD-10-CM

## 2017-01-24 DIAGNOSIS — N183 Chronic kidney disease, stage 3 (moderate): Secondary | ICD-10-CM | POA: Diagnosis present

## 2017-01-24 DIAGNOSIS — G20 Parkinson's disease: Secondary | ICD-10-CM | POA: Diagnosis present

## 2017-01-24 DIAGNOSIS — R079 Chest pain, unspecified: Secondary | ICD-10-CM | POA: Diagnosis not present

## 2017-01-24 DIAGNOSIS — E7800 Pure hypercholesterolemia, unspecified: Secondary | ICD-10-CM | POA: Diagnosis present

## 2017-01-24 DIAGNOSIS — Z7982 Long term (current) use of aspirin: Secondary | ICD-10-CM

## 2017-01-24 DIAGNOSIS — I4891 Unspecified atrial fibrillation: Secondary | ICD-10-CM | POA: Diagnosis not present

## 2017-01-24 DIAGNOSIS — Z85820 Personal history of malignant melanoma of skin: Secondary | ICD-10-CM

## 2017-01-24 DIAGNOSIS — Z8546 Personal history of malignant neoplasm of prostate: Secondary | ICD-10-CM

## 2017-01-24 LAB — CBC
HCT: 38.7 % — ABNORMAL LOW (ref 39.0–52.0)
HCT: 38.1 % — ABNORMAL LOW (ref 39.0–52.0)
Hemoglobin: 12.4 g/dL — ABNORMAL LOW (ref 13.0–17.0)
Hemoglobin: 12.2 g/dL — ABNORMAL LOW (ref 13.0–17.0)
MCH: 29.8 pg (ref 26.0–34.0)
MCH: 29.8 pg (ref 26.0–34.0)
MCHC: 32 g/dL (ref 30.0–36.0)
MCHC: 32 g/dL (ref 30.0–36.0)
MCV: 93 fL (ref 78.0–100.0)
MCV: 93.2 fL (ref 78.0–100.0)
Platelets: 144 10*3/uL — ABNORMAL LOW (ref 150–400)
Platelets: 141 10*3/uL — ABNORMAL LOW (ref 150–400)
RBC: 4.16 MIL/uL — ABNORMAL LOW (ref 4.22–5.81)
RBC: 4.09 MIL/uL — ABNORMAL LOW (ref 4.22–5.81)
RDW: 13.9 % (ref 11.5–15.5)
RDW: 13.9 % (ref 11.5–15.5)
WBC: 4.9 10*3/uL (ref 4.0–10.5)
WBC: 4.7 10*3/uL (ref 4.0–10.5)

## 2017-01-24 LAB — ECHOCARDIOGRAM COMPLETE
Height: 71 in
Weight: 3296 oz

## 2017-01-24 LAB — BASIC METABOLIC PANEL
Anion gap: 10 (ref 5–15)
BUN: 22 mg/dL — ABNORMAL HIGH (ref 6–20)
CO2: 23 mmol/L (ref 22–32)
Calcium: 8.9 mg/dL (ref 8.9–10.3)
Chloride: 104 mmol/L (ref 101–111)
Creatinine, Ser: 1.47 mg/dL — ABNORMAL HIGH (ref 0.61–1.24)
GFR calc Af Amer: 51 mL/min — ABNORMAL LOW (ref >60)
GFR calc non Af Amer: 44 mL/min — ABNORMAL LOW (ref >60)
Glucose, Bld: 88 mg/dL (ref 65–99)
Potassium: 4 mmol/L (ref 3.5–5.1)
Sodium: 137 mmol/L (ref 135–145)

## 2017-01-24 LAB — HEPARIN LEVEL (UNFRACTIONATED): Heparin Unfractionated: 0.16 IU/mL — ABNORMAL LOW (ref 0.30–0.70)

## 2017-01-24 LAB — I-STAT TROPONIN, ED: Troponin i, poc: 0.02 ng/mL (ref 0.00–0.08)

## 2017-01-24 LAB — TROPONIN I: Troponin I: <0.03 ng/mL (ref <0.03)

## 2017-01-24 SURGERY — LEFT HEART CATH AND CORONARY ANGIOGRAPHY
Anesthesia: LOCAL

## 2017-01-24 MED ORDER — HEPARIN BOLUS VIA INFUSION
4000.0000 [IU] | Freq: Once | INTRAVENOUS | Status: AC
  Administered 2017-01-24: 4000 [IU] via INTRAVENOUS
  Filled 2017-01-24: qty 4000

## 2017-01-24 MED ORDER — HEPARIN (PORCINE) IN NACL 100-0.45 UNIT/ML-% IJ SOLN
1750.0000 [IU]/h | INTRAMUSCULAR | Status: AC
  Administered 2017-01-24: 1100 [IU]/h via INTRAVENOUS
  Administered 2017-01-25: 1600 [IU]/h via INTRAVENOUS
  Administered 2017-01-26: 1750 [IU]/h via INTRAVENOUS
  Filled 2017-01-24 (×3): qty 250

## 2017-01-24 MED ORDER — ONDANSETRON HCL 4 MG/2ML IJ SOLN: 4.0000 mg | Freq: Four times a day (QID) | INTRAMUSCULAR | Status: DC | PRN

## 2017-01-24 MED ORDER — ASPIRIN EC 81 MG PO TBEC
81.0000 mg | DELAYED_RELEASE_TABLET | Freq: Every day | ORAL | Status: DC
  Administered 2017-01-25: 81 mg via ORAL
  Filled 2017-01-24: qty 1

## 2017-01-24 MED ORDER — ROSUVASTATIN CALCIUM 10 MG PO TABS
10.0000 mg | ORAL_TABLET | Freq: Every day | ORAL | Status: DC
  Administered 2017-01-24 – 2017-01-26 (×3): 10 mg via ORAL
  Filled 2017-01-24 (×4): qty 1

## 2017-01-24 MED ORDER — CLOPIDOGREL BISULFATE 75 MG PO TABS
75.0000 mg | ORAL_TABLET | Freq: Every day | ORAL | Status: DC
  Administered 2017-01-25 – 2017-01-27 (×3): 75 mg via ORAL
  Filled 2017-01-24 (×3): qty 1

## 2017-01-24 MED ORDER — TRIAMTERENE-HCTZ 37.5-25 MG PO TABS
1.0000 | ORAL_TABLET | Freq: Every day | ORAL | Status: DC
  Administered 2017-01-25 – 2017-01-27 (×3): 1 via ORAL
  Filled 2017-01-24 (×3): qty 1

## 2017-01-24 MED ORDER — ACETAMINOPHEN 325 MG PO TABS: 650.0000 mg | ORAL_TABLET | ORAL | Status: DC | PRN

## 2017-01-24 MED ORDER — TAMSULOSIN HCL 0.4 MG PO CAPS
0.4000 mg | ORAL_CAPSULE | Freq: Every day | ORAL | Status: DC
  Administered 2017-01-24 – 2017-01-26 (×4): 0.4 mg via ORAL
  Filled 2017-01-24 (×3): qty 1

## 2017-01-24 MED ORDER — DILTIAZEM HCL 30 MG PO TABS
30.0000 mg | ORAL_TABLET | Freq: Four times a day (QID) | ORAL | Status: DC
  Administered 2017-01-24 – 2017-01-25 (×3): 30 mg via ORAL
  Filled 2017-01-24 (×5): qty 1

## 2017-01-24 MED ORDER — NITROGLYCERIN 0.4 MG SL SUBL: 0.4000 mg | SUBLINGUAL_TABLET | SUBLINGUAL | Status: DC | PRN

## 2017-01-24 MED ORDER — LOSARTAN POTASSIUM 50 MG PO TABS
50.0000 mg | ORAL_TABLET | Freq: Every day | ORAL | Status: DC
  Administered 2017-01-25 – 2017-01-27 (×3): 50 mg via ORAL
  Filled 2017-01-24 (×3): qty 1

## 2017-01-24 MED ORDER — CARBIDOPA-LEVODOPA 25-100 MG PO TABS
1.0000 | ORAL_TABLET | Freq: Three times a day (TID) | ORAL | Status: DC
  Administered 2017-01-24 – 2017-01-27 (×9): 1 via ORAL
  Filled 2017-01-24 (×11): qty 1

## 2017-01-24 NOTE — ED Provider Notes (Signed)
[name] EMERGENCY DEPARTMENT Provider Note   CSN: [id number] Arrival date & time: 01/24/17  1218     History   Chief Complaint Chief Complaint  Patient presents with  . Back Pain    HPI [name] is a 81 y.o. male.  Patient c/o mid chest pain onset in past day. Pain intermittent, occurs at rest, lasts several minutes. States in past day also had felt a pain across neck, and radiating down left arm. Mild sob. No nv or diaphoresis. States hx cad/stent, but prior to stent pt indicates he does not recall having pain. There is no pleuritic pain. Denies cough or uri c/o. No fever or chills. No heartburn. No leg pain or swelling.    The history is provided by the patient.  Back Pain   Associated symptoms include chest pain. Pertinent negatives include no fever, no headaches and no abdominal pain.    Past Medical History:  Diagnosis Date  . Arthritis    "knees; lower back" (08/09/2016)  . Atrial fibrillation ([place])    remote hx/notes 08/07/2016  . Episodes of trembling    "most of the time it's my left hand" (08/09/2016)  . High cholesterol   . History of kidney stones   . Hypertension   . Melanoma of shoulder ([place]) 2000s   "left"  . Mitral regurgitation and mitral stenosis    hx/notes 08/07/2016  . Moderate aortic stenosis    hx/notes 08/07/2016  . Prostate cancer ([place]) 2011   hx/notes 08/07/2016    Patient Active Problem List   Diagnosis Date Noted  . Post PTCA 08/09/2016  . Dyspnea on exertion 08/07/2016  . Coronary artery calcification seen on CAT scan 08/07/2016    Past Surgical History:  Procedure Laterality Date  . CARDIAC CATHETERIZATION  ~ [address] [place]"  . CATARACT EXTRACTION W/ INTRAOCULAR LENS  IMPLANT, BILATERAL Bilateral 2014-02/2016   left-right; hx/notes 08/07/2016  . CORONARY ANGIOPLASTY WITH STENT PLACEMENT  08/09/2016  . CORONARY ATHERECTOMY N/A 08/09/2016   Procedure: Coronary Atherectomy;  Surgeon:  Adrian Prows, MD;  Location: [place] [facility];  Service: Cardiovascular;  Laterality: N/A;  . CORONARY STENT INTERVENTION N/A 08/09/2016   Procedure: Coronary Stent Intervention;  Surgeon: Adrian Prows, MD;  Location: [place] [facility];  Service: Cardiovascular;  Laterality: N/A;  LAD  . EYE SURGERY Bilateral 05/2016   "laser on both lenses"  . INGUINAL HERNIA REPAIR Left 1991  . INSERTION PROSTATE RADIATION SEED  06/25/2009  . INTRAVASCULAR PRESSURE WIRE/FFR STUDY N/A 08/09/2016   Procedure: Intravascular Pressure Wire/FFR Study;  Surgeon: Adrian Prows, MD;  Location: [place] [facility];  Service: Cardiovascular;  Laterality: N/A;  . LAPAROSCOPIC CHOLECYSTECTOMY  1993  . MELANOMA EXCISION Left 1990s   "shoulder"  . RIGHT/LEFT HEART CATH AND CORONARY ANGIOGRAPHY N/A 08/09/2016   Procedure: Right/Left Heart Cath and Coronary Angiography;  Surgeon: Adrian Prows, MD;  Location: [place] [facility];  Service: Cardiovascular;  Laterality: N/A;       Home Medications    Prior to Admission medications   Medication Sig Start Date End Date Taking? Authorizing Provider  aspirin EC 81 MG tablet Take 81 mg by mouth daily.    [provider]  carbidopa-levodopa (SINEMET IR) 25-100 MG tablet TAKE 1 TABLET THREE TIMES DAILY 11/14/16   Tat, Eustace Quail, DO  cholecalciferol (VITAMIN D) 1000 units tablet Take 1,000 Units by mouth daily.    [provider]  clopidogrel (PLAVIX)  75 MG tablet Take 75 mg by mouth daily.    [provider]  losartan (COZAAR) 50 MG tablet Take 50 mg by mouth daily.    [provider]  metoprolol succinate (TOPROL-XL) 100 MG 24 hr tablet Take 100 mg by mouth daily. Take with or immediately following a meal.    [provider]  Multiple Vitamins-Minerals (PRESERVISION AREDS) CAPS Take 1 capsule by mouth 2 (two) times daily.    [provider]  rosuvastatin (CRESTOR) 10 MG tablet Take 10 mg by mouth at bedtime.    [provider]   tamsulosin (FLOMAX) 0.4 MG CAPS capsule Take 0.4 mg by mouth at bedtime.    [provider]  triamterene-hydrochlorothiazide (MAXZIDE-25) 37.5-25 MG tablet Take 1 tablet by mouth daily.    [provider]  vitamin E 400 UNIT capsule Take 400 Units by mouth daily.    [provider]    Family History Family History  Problem Relation Age of Onset  . Heart attack Father   . Cerebral aneurysm Sister     Social History Social History   Tobacco Use  . Smoking status: Former Smoker    Years: 0.20    Last attempt to quit: 1958    Years since quitting: 60.9  . Smokeless tobacco: Never Used  . Tobacco comment: "smoked  2 months when I was 20; non since" (08/09/2016)  Substance Use Topics  . Alcohol use: Yes    Alcohol/week: 8.4 oz    Types: 14 Shots of liquor per week    Comment: 2 drinks per day (canadian mist)  . Drug use: No     Allergies   Patient has no known allergies.   Review of Systems Review of Systems  Constitutional: Negative for fever.  HENT: Negative for sore throat.   Eyes: Negative for redness.  Respiratory: Negative for shortness of breath.   Cardiovascular: Positive for chest pain.  Gastrointestinal: Negative for abdominal pain and vomiting.  Genitourinary: Negative for flank pain.  Musculoskeletal: Positive for back pain. Negative for neck pain.  Skin: Negative for rash.  Neurological: Negative for headaches.  Hematological: Does not bruise/bleed easily.  Psychiatric/Behavioral: Negative for confusion.     Physical Exam Updated Vital Signs BP (!) 153/99   Pulse (!) 36   Temp 97.7 F (36.5 C) (Oral)   Resp 15   Ht 1.803 m (5\' 11" )   Wt 93.4 kg (206 lb)   SpO2 100%   BMI 28.73 kg/m   Physical Exam  Constitutional: He appears well-developed and well-nourished. No distress.  HENT:  Mouth/Throat: Oropharynx is clear and moist.  Eyes: Conjunctivae are normal.  Neck: Neck supple. No tracheal deviation present.    Cardiovascular: Normal rate, regular rhythm, normal heart sounds and intact distal pulses. Exam reveals no gallop and no friction rub.  No murmur heard. Pulmonary/Chest: Effort normal and breath sounds normal. No accessory muscle usage. No respiratory distress.  Abdominal: Soft. Bowel sounds are normal. He exhibits no distension. There is no tenderness.  Genitourinary:  Genitourinary Comments: No cva tenderness  Musculoskeletal: He exhibits no edema or tenderness.  Neurological: He is alert.  Skin: Skin is warm and dry. No rash noted. He is not diaphoretic.  Psychiatric: He has a normal mood and affect.  Nursing note and vitals reviewed.    ED Treatments / Results  Labs (all labs ordered are listed, but only abnormal results are displayed) Results for orders placed or performed during the hospital encounter  of 98/92/11  Basic metabolic panel  Result Value Ref Range   Sodium 137 135 - 145 mmol/L   Potassium 4.0 3.5 - 5.1 mmol/L   Chloride 104 101 - 111 mmol/L   CO2 23 22 - 32 mmol/L   Glucose, Bld 88 65 - 99 mg/dL   BUN 22 (H) 6 - 20 mg/dL   Creatinine, Ser 1.47 (H) 0.61 - 1.24 mg/dL   Calcium 8.9 8.9 - 10.3 mg/dL   GFR calc non Af Amer 44 (L) >60 mL/min   GFR calc Af Amer 51 (L) >60 mL/min   Anion gap 10 5 - 15  CBC  Result Value Ref Range   WBC 4.9 4.0 - 10.5 K/uL   RBC 4.16 (L) 4.22 - 5.81 MIL/uL   Hemoglobin 12.4 (L) 13.0 - 17.0 g/dL   HCT 38.7 (L) 39.0 - 52.0 %   MCV 93.0 78.0 - 100.0 fL   MCH 29.8 26.0 - 34.0 pg   MCHC 32.0 30.0 - 36.0 g/dL   RDW 13.9 11.5 - 15.5 %   Platelets 144 (L) 150 - 400 K/uL  CBC  Result Value Ref Range   WBC 4.7 4.0 - 10.5 K/uL   RBC 4.09 (L) 4.22 - 5.81 MIL/uL   Hemoglobin 12.2 (L) 13.0 - 17.0 g/dL   HCT 38.1 (L) 39.0 - 52.0 %   MCV 93.2 78.0 - 100.0 fL   MCH 29.8 26.0 - 34.0 pg   MCHC 32.0 30.0 - 36.0 g/dL   RDW 13.9 11.5 - 15.5 %   Platelets 141 (L) 150 - 400 K/uL  Troponin I  Result Value Ref Range   Troponin I <0.03 <0.03  ng/mL  Lipid panel  Result Value Ref Range   Cholesterol 109 0 - 200 mg/dL   Triglycerides 62 <150 mg/dL   HDL 52 >40 mg/dL   Total CHOL/HDL Ratio 2.1 RATIO   VLDL 12 0 - 40 mg/dL   LDL Cholesterol 45 0 - 99 mg/dL  Basic metabolic panel  Result Value Ref Range   Sodium 137 135 - 145 mmol/L   Potassium 4.2 3.5 - 5.1 mmol/L   Chloride 104 101 - 111 mmol/L   CO2 26 22 - 32 mmol/L   Glucose, Bld 88 65 - 99 mg/dL   BUN 27 (H) 6 - 20 mg/dL   Creatinine, Ser 1.55 (H) 0.61 - 1.24 mg/dL   Calcium 8.9 8.9 - 10.3 mg/dL   GFR calc non Af Amer 41 (L) >60 mL/min   GFR calc Af Amer 47 (L) >60 mL/min   Anion gap 7 5 - 15  CBC  Result Value Ref Range   WBC 3.9 (L) 4.0 - 10.5 K/uL   RBC 3.94 (L) 4.22 - 5.81 MIL/uL   Hemoglobin 11.7 (L) 13.0 - 17.0 g/dL   HCT 36.1 (L) 39.0 - 52.0 %   MCV 91.6 78.0 - 100.0 fL   MCH 29.7 26.0 - 34.0 pg   MCHC 32.4 30.0 - 36.0 g/dL   RDW 13.7 11.5 - 15.5 %   Platelets 143 (L) 150 - 400 K/uL  Heparin level (unfractionated)  Result Value Ref Range   Heparin Unfractionated 0.16 (L) 0.30 - 0.70 IU/mL  Heparin level (unfractionated)  Result Value Ref Range   Heparin Unfractionated 0.25 (L) 0.30 - 0.70 IU/mL  I-stat troponin, ED  Result Value Ref Range   Troponin i, poc 0.02 0.00 - 0.08 ng/mL   Comment 3  ECHOCARDIOGRAM COMPLETE  Result Value Ref Range   Weight 3,296 oz   Height 71 in   BP 139/72 mmHg   Dg Chest Port 1 View  Result Date: 01/24/2017 CLINICAL DATA:  Pain radiating between the shoulder blades and in to the left arm EXAM: PORTABLE CHEST 1 VIEW COMPARISON:  Chest x-ray of 05/25/2009 FINDINGS: Chronic elevation of the right hemidiaphragm is stable with mild right basilar volume loss. The left lung is clear. Mediastinal and hilar contours are unremarkable. Heart size is stable. No bony abnormality is seen. IMPRESSION: Stable chronic elevation of the right hemidiaphragm. No definite active process. Electronically Signed   By: Ivar Drape M.D.    On: 01/24/2017 12:55    EKG  EKG Interpretation  Date/Time:  Tuesday January 24 2017 12:27:05 EST Ventricular Rate:  105 PR Interval:    QRS Duration: 100 QT Interval:  363 QTC Calculation: 403 R Axis:   -3 Text Interpretation:  Sinus tachycardia Ventricular bigeminy `no acute st/t changes Confirmed by Lajean Saver [phone]) on 01/24/2017 12:32:21 PM       Radiology No results found.  Procedures Procedures (including critical care time)  Medications Ordered in ED Medications - No data to display   Initial Impression / Assessment and Plan / ED Course  I have reviewed the triage vital signs and the nursing notes.  Pertinent labs & imaging results that were available during my care of the patient were reviewed by me and considered in my medical decision making (see chart for details).  Iv ns. Labs sent.  Continuous pulse ox and monitor. O2.  Portable cxr.   Pt initially appears in sinus rhythm.   While in room, patient becomes tachycardic, hr varies from 120 to mid 140s, initially appears irregular/afib, however on repeat ecg appears more regular/svt.   Pt spontaneously converts back to sinus rhythm.  Patients cardiologist consulted.   Reviewed nursing notes and prior charts for additional history.   Recheck, no chest pain, hr improved.   Cardiology in ED evaluating patient - they will admit.     Final Clinical Impressions(s) / ED Diagnoses   Final diagnoses:  None    ED Discharge Orders    None       Lajean Saver, MD 01/25/17 [phone]

## 2017-01-24 NOTE — H&P (Signed)
[name] is an 81 y.o. male.   Chief Complaint: Chest pain HPI:   81 year old male with AD status post CSI atherectomy and PCI to mid LAD in 07/2016, , mild aortic stenosis, remote history of Afib, history of prostate cancer status post radiation 2011, Chypertension, CKD 3, Parkinsons disease.[name]   He presented to the hospital today with episodes of chest pain that for across his chest, sharp, intermittent. Pain is worse with standing and walking. Does not have any pain at rest. He denies any shortness of breath. He occasionally feels lightheadedness but no syncope. He has stable bilateral leg edema.   EKG in the ED showed sinus rhythm with atrial bigeminy and supraventricular tachycardia on 2 separate EKGs. On EKG with pseudo-ventricular tachycardia, he has mild lateral ischemia. Echocardiogram performed today was limited in evaluation of aortic stenosis, but showed no regional wall motion abnormalities. EF was normal around 55%.  Past Medical History:  Diagnosis Date  . Arthritis    "knees; lower back" (08/09/2016)  . Atrial fibrillation ([place])    remote hx/notes 08/07/2016  . Episodes of trembling    "most of the time it's my left hand" (08/09/2016)  . High cholesterol   . History of kidney stones   . Hypertension   . Melanoma of shoulder ([place]) 2000s   "left"  . Mitral regurgitation and mitral stenosis    hx/notes 08/07/2016  . Moderate aortic stenosis    hx/notes 08/07/2016  . Prostate cancer ([place]) 2011   hx/notes 08/07/2016    Past Surgical History:  Procedure Laterality Date  . CARDIAC CATHETERIZATION  ~ [address], [place]"  . CATARACT EXTRACTION W/ INTRAOCULAR LENS  IMPLANT, BILATERAL Bilateral 2014-02/2016   left-right; hx/notes 08/07/2016  . CORONARY ANGIOPLASTY WITH STENT PLACEMENT  08/09/2016  . CORONARY ATHERECTOMY N/A 08/09/2016   Procedure: Coronary Atherectomy;  Surgeon: Adrian Prows, MD;  Location: [place] [facility];  Service: Cardiovascular;   Laterality: N/A;  . CORONARY STENT INTERVENTION N/A 08/09/2016   Procedure: Coronary Stent Intervention;  Surgeon: Adrian Prows, MD;  Location: [facility] [facility];  Service: Cardiovascular;  Laterality: N/A;  LAD  . EYE SURGERY Bilateral 05/2016   "laser on both lenses"  . INGUINAL HERNIA REPAIR Left 1991  . INSERTION PROSTATE RADIATION SEED  06/25/2009  . INTRAVASCULAR PRESSURE WIRE/FFR STUDY N/A 08/09/2016   Procedure: Intravascular Pressure Wire/FFR Study;  Surgeon: Adrian Prows, MD;  Location: [place] [facility];  Service: Cardiovascular;  Laterality: N/A;  . LAPAROSCOPIC CHOLECYSTECTOMY  1993  . MELANOMA EXCISION Left 1990s   "shoulder"  . RIGHT/LEFT HEART CATH AND CORONARY ANGIOGRAPHY N/A 08/09/2016   Procedure: Right/Left Heart Cath and Coronary Angiography;  Surgeon: Adrian Prows, MD;  Location: [facility];  Service: Cardiovascular;  Laterality: N/A;    Family History  Problem Relation Age of Onset  . Heart attack Father   . Cerebral aneurysm Sister    Social History:  reports that he quit smoking about 60 years ago. He quit after 0.20 years of use. he has never used smokeless tobacco. He reports that he drinks about 8.4 oz of alcohol per week. He reports that he does not use drugs.  Allergies: No Known Allergies   (Not in a hospital admission)  Results for orders placed or performed during the hospital encounter of 01/24/17 (from the past 48 hour(s))  Basic metabolic panel     Status: Abnormal   Collection Time: 01/24/17 12:32 PM  Result Value Ref  Range   Sodium 137 135 - 145 mmol/L   Potassium 4.0 3.5 - 5.1 mmol/L   Chloride 104 101 - 111 mmol/L   CO2 23 22 - 32 mmol/L   Glucose, Bld 88 65 - 99 mg/dL   BUN 22 (H) 6 - 20 mg/dL   Creatinine, Ser 1.47 (H) 0.61 - 1.24 mg/dL   Calcium 8.9 8.9 - 10.3 mg/dL   GFR calc non Af Amer 44 (L) >60 mL/min   GFR calc Af Amer 51 (L) >60 mL/min    Comment: (NOTE) The eGFR has been calculated using the CKD EPI equation. This  calculation has not been validated in all clinical situations. eGFR's persistently <60 mL/min signify possible Chronic Kidney Disease.    Anion gap 10 5 - 15  CBC     Status: Abnormal   Collection Time: 01/24/17 12:32 PM  Result Value Ref Range   WBC 4.9 4.0 - 10.5 K/uL   RBC 4.16 (L) 4.22 - 5.81 MIL/uL   Hemoglobin 12.4 (L) 13.0 - 17.0 g/dL   HCT 38.7 (L) 39.0 - 52.0 %   MCV 93.0 78.0 - 100.0 fL   MCH 29.8 26.0 - 34.0 pg   MCHC 32.0 30.0 - 36.0 g/dL   RDW 13.9 11.5 - 15.5 %   Platelets 144 (L) 150 - 400 K/uL  I-stat troponin, ED     Status: None   Collection Time: 01/24/17 12:42 PM  Result Value Ref Range   Troponin i, poc 0.02 0.00 - 0.08 ng/mL   Comment 3            Comment: Due to the release kinetics of cTnI, a negative result within the first hours of the onset of symptoms does not rule out myocardial infarction with certainty. If myocardial infarction is still suspected, repeat the test at appropriate intervals.   CBC     Status: Abnormal   Collection Time: 01/24/17  2:49 PM  Result Value Ref Range   WBC 4.7 4.0 - 10.5 K/uL   RBC 4.09 (L) 4.22 - 5.81 MIL/uL   Hemoglobin 12.2 (L) 13.0 - 17.0 g/dL   HCT 38.1 (L) 39.0 - 52.0 %   MCV 93.2 78.0 - 100.0 fL   MCH 29.8 26.0 - 34.0 pg   MCHC 32.0 30.0 - 36.0 g/dL   RDW 13.9 11.5 - 15.5 %   Platelets 141 (L) 150 - 400 K/uL  Troponin I     Status: None   Collection Time: 01/24/17  2:49 PM  Result Value Ref Range   Troponin I <0.03 <0.03 ng/mL   Dg Chest Port 1 View  Result Date: 01/24/2017 CLINICAL DATA:  Pain radiating between the shoulder blades and in to the left arm EXAM: PORTABLE CHEST 1 VIEW COMPARISON:  Chest x-ray of 05/25/2009 FINDINGS: Chronic elevation of the right hemidiaphragm is stable with mild right basilar volume loss. The left lung is clear. Mediastinal and hilar contours are unremarkable. Heart size is stable. No bony abnormality is seen. IMPRESSION: Stable chronic elevation of the right hemidiaphragm.  No definite active process. Electronically Signed   By: Ivar Drape M.D.   On: 01/24/2017 12:55    EKG 01/24/2017: Superventricular tachycardia. Mild ST depression lateral leads.           Current Meds  Medication Sig  . aspirin EC 81 MG tablet Take 81 mg by mouth daily.  . Capsaicin-Menthol (SALONPAS GEL EX) Apply 1 application topically as needed (back pain).  . carbidopa-levodopa (  SINEMET IR) 25-100 MG tablet TAKE 1 TABLET THREE TIMES DAILY  . cholecalciferol (VITAMIN D) 1000 units tablet Take 1,000 Units by mouth daily.  . clopidogrel (PLAVIX) 75 MG tablet Take 75 mg by mouth daily.  [name] losartan (COZAAR) 50 MG tablet Take 50 mg by mouth daily.  . metoprolol succinate (TOPROL-XL) 100 MG 24 hr tablet Take 100 mg by mouth daily. Take with or immediately following a meal.  . Multiple Vitamins-Minerals (PRESERVISION AREDS) CAPS Take 1 capsule by mouth 2 (two) times daily.  . rosuvastatin (CRESTOR) 10 MG tablet Take 10 mg by mouth at bedtime.  . tamsulosin (FLOMAX) 0.4 MG CAPS capsule Take 0.4 mg by mouth at bedtime.  . triamterene-hydrochlorothiazide (MAXZIDE-25) 37.5-25 MG tablet Take 1 tablet by mouth daily.  . vitamin E 400 UNIT capsule Take 400 Units by mouth daily.      Review of Systems  Constitutional: Negative.   HENT: Negative.   Respiratory: Negative for shortness of breath.   Cardiovascular: Positive for chest pain and leg swelling. Negative for palpitations.  Gastrointestinal: Negative.   Genitourinary: Negative.   Musculoskeletal: Positive for neck pain.  Skin: Negative.   Neurological: Positive for dizziness. Negative for loss of consciousness.  Endo/Heme/Allergies: Does not bruise/bleed easily.  Psychiatric/Behavioral: Negative.   All other systems reviewed and are negative.   Blood pressure 124/76, pulse (!) 45, temperature 97.7 F (36.5 C), temperature source Oral, resp. rate (!) 21, height '5\' 11"'  (1.803 m), weight 93.4 kg (206 lb), SpO2 98 %. Physical  Exam  Nursing note and vitals reviewed. Constitutional: He is oriented to person, place, and time. He appears well-developed and well-nourished.  HENT:  Head: Normocephalic and atraumatic.  Neck: No JVD present.  Cardiovascular:  Murmur (II/VI aortic, crescendo\) heard. Tachycardic  Respiratory: Effort normal and breath sounds normal. No respiratory distress. He has no wheezes. He has no rales.  GI: Soft. Bowel sounds are normal.  Musculoskeletal: He exhibits edema (2+ b/l. Varicose veins seen).  Neurological: He is alert and oriented to person, place, and time.  Skin: Skin is warm and dry.     Assessment:  81 year old male with AD status post CSI atherectomy and PCI to mid LAD in 07/2016, , mild aortic stenosis, remote history of Afib, history of prostate cancer status post radiation 2011, Chypertension, CKD 3, Parkinsons disease, admitted with intermittent chest pain.  Chest pain: Likely supply demand mismatch in the setting of supraventricular tachycardia. No signs of myocardial injury on echocardiogram or troponin. Frequent PVCs and supraventricular tachycardia Known CAD Mild aortic stenosis CKD 3 Hypertension Parkinson's disease  Plan: Start diltiazem 30 mg every 6 hours. Will switch to CD 120 or 40 mg daily. Continue metoprolol succinate 100 mg, triamterene HCTZ, losartan. Continue aspirin and Plavix. Continue statin. Continue baseline Parkinson's medications.   [name], MD 01/24/2017, 7:19 PM  [place], MD [facility] [place] Pager: [phone] Office: [phone] If no answer Cell [phone]

## 2017-01-24 NOTE — Progress Notes (Signed)
  Echocardiogram 2D Echocardiogram has been performed.  Jennette Dubin 01/24/2017, 3:26 PM

## 2017-01-24 NOTE — Progress Notes (Signed)
ANTICOAGULATION CONSULT NOTE - Initial Consult  Pharmacy Consult for Heparin Indication: chest pain/ACS  No Known Allergies  Patient Measurements: Height: 5\' 11"  (180.3 cm) Weight: 206 lb (93.4 kg) IBW/kg (Calculated) : 75.3 Heparin Dosing Weight: 93 kg   Vital Signs: Temp: 97.7 F (36.5 C) (11/27 1231) Temp Source: Oral (11/27 1231) BP: 139/72 (11/27 1330) Pulse Rate: 48 (11/27 1330)  Labs: Recent Labs    01/24/17 1232  HGB 12.4*  HCT 38.7*  PLT 144*  CREATININE 1.47*    Estimated Creatinine Clearance: 47.5 mL/min (A) (by C-G formula based on SCr of 1.47 mg/dL (H)).   Medical History: Past Medical History:  Diagnosis Date  . Arthritis    "knees; lower back" (08/09/2016)  . Atrial fibrillation ([place])    remote hx/notes 08/07/2016  . Episodes of trembling    "most of the time it's my left hand" (08/09/2016)  . High cholesterol   . History of kidney stones   . Hypertension   . Melanoma of shoulder ([place]) 2000s   "left"  . Mitral regurgitation and mitral stenosis    hx/notes 08/07/2016  . Moderate aortic stenosis    hx/notes 08/07/2016  . Prostate cancer ([place]) 2011   hx/notes 08/07/2016    Medications:   (Not in a hospital admission)  Assessment: 89 YOM with chest pain to start IV heparin for ACS. Initial troponin is normal. He is not on any anticoagulation prior to admission. H/H low. Plt low. Scheduled for LHC.   Goal of Therapy:  Heparin level 0.3-0.7 units/ml Monitor platelets by anticoagulation protocol: Yes   Plan:  -Heparin 4000 units IV bolus, then heparin infusion at 1100 units/hr  -F/u after cath  -Monitor daily HL, CBC and s/s of bleeding   Albertina Parr, PharmD., BCPS Clinical Pharmacist Pager [phone]

## 2017-01-24 NOTE — ED Triage Notes (Addendum)
Pt arrives via EMS from home with complaints of pain between shoulder blades, neck, and radiating down left arm. Denies CP/ SOB. Pt reports symptoms began yesterday and is worse when standing. PT reports same pain as when he had 90% occlusion of LAD in June-2 stents placed. A&Ox 4. Pt took 324 ASA and 1 NTG SL pta

## 2017-01-24 NOTE — ED Notes (Signed)
Cardiology at bedside.

## 2017-01-24 NOTE — Progress Notes (Signed)
ANTICOAGULATION CONSULT NOTE - Follow Up Consult  Pharmacy Consult for Heparin Indication: chest pain/ACS  No Known Allergies  Patient Measurements: Height: 5\' 11"  (180.3 cm) Weight: 207 lb 9.6 oz (94.2 kg) IBW/kg (Calculated) : 75.3 Heparin Dosing Weight: 93 kg  Vital Signs: Temp: 97.9 F (36.6 C) (11/27 2110) Temp Source: Oral (11/27 2110) BP: 118/77 (11/27 2110) Pulse Rate: 57 (11/27 2110)  Labs: Recent Labs    01/24/17 1232 01/24/17 1449 01/24/17 2123  HGB 12.4* 12.2*  --   HCT 38.7* 38.1*  --   PLT 144* 141*  --   HEPARINUNFRC  --   --  0.16*  CREATININE 1.47*  --   --   TROPONINI  --  <0.03  --     Estimated Creatinine Clearance: 47.8 mL/min (A) (by C-G formula based on SCr of 1.47 mg/dL (H)).  Assessment:   81 yr old male on IV heparin for ACS.  Cardiac cath cancelled fpr today.    Initial heparin level is subtherapeutic (0.16) on 1100 units/hr. No infusion problems reported.  Troponin negative.  Low baseline platelet count.  Goal of Therapy:  Heparin level 0.3-0.7 units/ml Monitor platelets by anticoagulation protocol: Yes   Plan:   Increase heparin drip to 1400 units/hr.  Daily heparin level and CBC while on heparin.  Next labs in am.  Watch platelet count.  Arty Baumgartner, [place] Pager: [phone] 01/24/2017,11:16 PM

## 2017-01-25 ENCOUNTER — Encounter (HOSPITAL_COMMUNITY): Payer: Self-pay | Admitting: *Deleted

## 2017-01-25 ENCOUNTER — Observation Stay (HOSPITAL_COMMUNITY): Payer: Medicare Other

## 2017-01-25 ENCOUNTER — Encounter (HOSPITAL_COMMUNITY)
Admission: EM | Disposition: A | Discharge status: Home / Self Care | Payer: Self-pay | Source: Home / Self Care | Attending: Cardiology

## 2017-01-25 DIAGNOSIS — Z7982 Long term (current) use of aspirin: Secondary | ICD-10-CM | POA: Diagnosis not present

## 2017-01-25 DIAGNOSIS — Z7902 Long term (current) use of antithrombotics/antiplatelets: Secondary | ICD-10-CM | POA: Diagnosis not present

## 2017-01-25 DIAGNOSIS — I129 Hypertensive chronic kidney disease with stage 1 through stage 4 chronic kidney disease, or unspecified chronic kidney disease: Secondary | ICD-10-CM | POA: Diagnosis present

## 2017-01-25 DIAGNOSIS — Z8546 Personal history of malignant neoplasm of prostate: Secondary | ICD-10-CM | POA: Diagnosis not present

## 2017-01-25 DIAGNOSIS — Z955 Presence of coronary angioplasty implant and graft: Secondary | ICD-10-CM | POA: Diagnosis not present

## 2017-01-25 DIAGNOSIS — I350 Nonrheumatic aortic (valve) stenosis: Secondary | ICD-10-CM | POA: Diagnosis not present

## 2017-01-25 DIAGNOSIS — Z923 Personal history of irradiation: Secondary | ICD-10-CM | POA: Diagnosis not present

## 2017-01-25 DIAGNOSIS — Z8249 Family history of ischemic heart disease and other diseases of the circulatory system: Secondary | ICD-10-CM | POA: Diagnosis not present

## 2017-01-25 DIAGNOSIS — I471 Supraventricular tachycardia: Secondary | ICD-10-CM | POA: Diagnosis present

## 2017-01-25 DIAGNOSIS — N183 Chronic kidney disease, stage 3 (moderate): Secondary | ICD-10-CM | POA: Diagnosis present

## 2017-01-25 DIAGNOSIS — E7800 Pure hypercholesterolemia, unspecified: Secondary | ICD-10-CM | POA: Diagnosis present

## 2017-01-25 DIAGNOSIS — I4891 Unspecified atrial fibrillation: Secondary | ICD-10-CM | POA: Diagnosis present

## 2017-01-25 DIAGNOSIS — N179 Acute kidney failure, unspecified: Secondary | ICD-10-CM | POA: Diagnosis present

## 2017-01-25 DIAGNOSIS — Z85820 Personal history of malignant melanoma of skin: Secondary | ICD-10-CM | POA: Diagnosis not present

## 2017-01-25 DIAGNOSIS — Z87891 Personal history of nicotine dependence: Secondary | ICD-10-CM | POA: Diagnosis not present

## 2017-01-25 DIAGNOSIS — I083 Combined rheumatic disorders of mitral, aortic and tricuspid valves: Secondary | ICD-10-CM | POA: Diagnosis present

## 2017-01-25 DIAGNOSIS — I2510 Atherosclerotic heart disease of native coronary artery without angina pectoris: Secondary | ICD-10-CM | POA: Diagnosis present

## 2017-01-25 DIAGNOSIS — G20 Parkinson's disease: Secondary | ICD-10-CM | POA: Diagnosis present

## 2017-01-25 DIAGNOSIS — I4892 Unspecified atrial flutter: Secondary | ICD-10-CM | POA: Diagnosis present

## 2017-01-25 DIAGNOSIS — J45901 Unspecified asthma with (acute) exacerbation: Secondary | ICD-10-CM | POA: Diagnosis not present

## 2017-01-25 DIAGNOSIS — R072 Precordial pain: Secondary | ICD-10-CM | POA: Diagnosis not present

## 2017-01-25 HISTORY — PX: TEE WITHOUT CARDIOVERSION: SHX5443

## 2017-01-25 LAB — CBC
HCT: 36.1 % — ABNORMAL LOW (ref 39.0–52.0)
Hemoglobin: 11.7 g/dL — ABNORMAL LOW (ref 13.0–17.0)
MCH: 29.7 pg (ref 26.0–34.0)
MCHC: 32.4 g/dL (ref 30.0–36.0)
MCV: 91.6 fL (ref 78.0–100.0)
Platelets: 143 10*3/uL — ABNORMAL LOW (ref 150–400)
RBC: 3.94 MIL/uL — ABNORMAL LOW (ref 4.22–5.81)
RDW: 13.7 % (ref 11.5–15.5)
WBC: 3.9 10*3/uL — ABNORMAL LOW (ref 4.0–10.5)

## 2017-01-25 LAB — BASIC METABOLIC PANEL
Anion gap: 7 (ref 5–15)
BUN: 27 mg/dL — ABNORMAL HIGH (ref 6–20)
CO2: 26 mmol/L (ref 22–32)
Calcium: 8.9 mg/dL (ref 8.9–10.3)
Chloride: 104 mmol/L (ref 101–111)
Creatinine, Ser: 1.55 mg/dL — ABNORMAL HIGH (ref 0.61–1.24)
GFR calc Af Amer: 47 mL/min — ABNORMAL LOW (ref >60)
GFR calc non Af Amer: 41 mL/min — ABNORMAL LOW (ref >60)
Glucose, Bld: 88 mg/dL (ref 65–99)
Potassium: 4.2 mmol/L (ref 3.5–5.1)
Sodium: 137 mmol/L (ref 135–145)

## 2017-01-25 LAB — HEPARIN LEVEL (UNFRACTIONATED)
Heparin Unfractionated: 0.25 IU/mL — ABNORMAL LOW (ref 0.30–0.70)
Heparin Unfractionated: 0.38 IU/mL (ref 0.30–0.70)
Heparin Unfractionated: 0.26 IU/mL — ABNORMAL LOW (ref 0.30–0.70)

## 2017-01-25 LAB — LIPID PANEL
Cholesterol: 109 mg/dL (ref 0–200)
HDL: 52 mg/dL (ref >40)
LDL Cholesterol: 45 mg/dL (ref 0–99)
Total CHOL/HDL Ratio: 2.1 RATIO
Triglycerides: 62 mg/dL (ref <150)
VLDL: 12 mg/dL (ref 0–40)

## 2017-01-25 LAB — TROPONIN I: Troponin I: <0.03 ng/mL (ref <0.03)

## 2017-01-25 SURGERY — ECHOCARDIOGRAM, TRANSESOPHAGEAL
Anesthesia: Moderate Sedation

## 2017-01-25 MED ORDER — MIDAZOLAM HCL 10 MG/2ML IJ SOLN
INTRAMUSCULAR | Status: DC | PRN
  Administered 2017-01-25 (×3): 1 mg via INTRAVENOUS

## 2017-01-25 MED ORDER — SODIUM CHLORIDE 0.9 % IV SOLN: INTRAVENOUS | Status: DC

## 2017-01-25 MED ORDER — FENTANYL CITRATE (PF) 100 MCG/2ML IJ SOLN
INTRAMUSCULAR | Status: AC
  Filled 2017-01-25: qty 2

## 2017-01-25 MED ORDER — BUTAMBEN-TETRACAINE-BENZOCAINE 2-2-14 % EX AERO
INHALATION_SPRAY | CUTANEOUS | Status: DC | PRN
  Administered 2017-01-25: 2 via TOPICAL

## 2017-01-25 MED ORDER — MIDAZOLAM HCL 5 MG/ML IJ SOLN
INTRAMUSCULAR | Status: AC
  Filled 2017-01-25: qty 2

## 2017-01-25 MED ORDER — SODIUM CHLORIDE 0.9 % IV SOLN
INTRAVENOUS | Status: AC | PRN
  Administered 2017-01-25: 500 mL via INTRAVENOUS

## 2017-01-25 MED ORDER — FENTANYL CITRATE (PF) 100 MCG/2ML IJ SOLN
INTRAMUSCULAR | Status: DC | PRN
  Administered 2017-01-25 (×2): 25 ug via INTRAVENOUS

## 2017-01-25 MED ORDER — DILTIAZEM HCL 60 MG PO TABS
60.0000 mg | ORAL_TABLET | Freq: Four times a day (QID) | ORAL | Status: DC
  Administered 2017-01-25 – 2017-01-26 (×2): 60 mg via ORAL
  Filled 2017-01-25 (×3): qty 1

## 2017-01-25 MED ORDER — SODIUM CHLORIDE 0.9 % IV SOLN
INTRAVENOUS | Status: AC
  Administered 2017-01-25: 18:00:00 via INTRAVENOUS

## 2017-01-25 NOTE — CV Procedure (Signed)
TEE: Under moderate sedation, TEE was performed without complications: LV: Normal. Normal EF. RV: Normal LA: Normal. Left atrial appendage: Spontaneous echo contrast without thrombus. Mildly reduced velocities. Inter atrial septum is intact without defect. Double contrast study negative for atrial level shunting. No late appearance of bubbles either. RA: Normal MV: Normal. Mild MR. TV: Normal. Mild TR AV: Moderate calcification. Mild AS, Mild AI. PV: Normal. Trace PI.  Thoracic and ascending aorta: Moderate atherosclerotic plaque.  Conscious sedation protocol was followed, I personally administered conscious sedation and monitored the patient. Patient received 3 milligrams of Versed and 50 . Patient tolerated the procedure well and there was no complication from conscious sedation. Time administered was 17 min and procedure ended at 4:30 PM.  [name], MD [facility] [place] Pager: [phone] Office: [phone] If no answer Cell [phone]

## 2017-01-25 NOTE — Progress Notes (Signed)
ANTICOAGULATION CONSULT NOTE - Follow Up Consult  Pharmacy Consult for Heparin Indication: chest pain/ACS  No Known Allergies  Patient Measurements: Height: 5\' 11"  (180.3 cm) Weight: 207 lb 9.6 oz (94.2 kg) IBW/kg (Calculated) : 75.3 Heparin Dosing Weight: 93 kg  Vital Signs: Temp: 97.9 F (36.6 C) (11/28 0500) Temp Source: Oral (11/28 0500) BP: 125/67 (11/28 0500) Pulse Rate: 72 (11/28 0500)  Labs: Recent Labs    01/24/17 1232 01/24/17 1449 01/24/17 2123 01/25/17 0443  HGB 12.4* 12.2*  --  11.7*  HCT 38.7* 38.1*  --  36.1*  PLT 144* 141*  --  143*  HEPARINUNFRC  --   --  0.16* 0.25*  CREATININE 1.47*  --   --   --   TROPONINI  --  <0.03  --   --     Estimated Creatinine Clearance: 47.8 mL/min (A) (by C-G formula based on SCr of 1.47 mg/dL (H)).  Assessment: 81 yr old male on IV heparin for ACS.    Heparin level subtherapeutic at 0.25 and no infusion issues per RN. CBC stable and no s/s bleeding noted.   Goal of Therapy:  Heparin level 0.3-0.7 units/ml Monitor platelets by anticoagulation protocol: Yes   Plan:  Increase heparin gtt to 1600 units/hr Heparin level in 8 hrs Daily heparin level and CBC Monitor for s/s bleeding   Lavonda Jumbo, PharmD Clinical Pharmacist 01/25/17 6:26 AM

## 2017-01-25 NOTE — H&P (View-Only) (Signed)
Subjective:  No chest pain Feels well this morning  Objective:  Vital Signs in the last 24 hours: Temp:  [97.7 F (36.5 C)-97.9 F (36.6 C)] 97.9 F (36.6 C) (11/28 0500) Pulse Rate:  [36-142] 72 (11/28 0500) Resp:  [14-22] 18 (11/28 0500) BP: (118-153)/(67-99) 125/67 (11/28 0500) SpO2:  [92 %-100 %] 98 % (11/28 0500) Weight:  [93.4 kg (206 lb)-94.2 kg (207 lb 9.6 oz)] 94.2 kg (207 lb 9.6 oz) (11/27 2110)  Intake/Output from previous day: 11/27 0701 - 11/28 0700 In: -  Out: 450 [Urine:450] Intake/Output from this shift: No intake/output data recorded.  Physical Exam: Nursing note and vitals reviewed. Constitutional: He is oriented to person, place, and time. He appears well-developed and well-nourished.  HENT:  Head: Normocephalic and atraumatic.  Neck: No JVD present.  Cardiovascular:  Murmur (II/VI aortic, crescendo\) heard. Controlled ventricular rate Respiratory: Effort normal and breath sounds normal. No respiratory distress. He has no wheezes. He has no rales.  GI: Soft. Bowel sounds are normal.  Musculoskeletal: He exhibits edema (2+ b/l. Varicose veins seen).  Neurological: He is alert and oriented to person, place, and time.  Skin: Skin is warm and dry.     Lab Results: Recent Labs    01/24/17 1449 01/25/17 0443  WBC 4.7 3.9*  HGB 12.2* 11.7*  PLT 141* 143*   Recent Labs    01/24/17 1232 01/25/17 0443  NA 137 137  K 4.0 4.2  CL 104 104  CO2 23 26  GLUCOSE 88 88  BUN 22* 27*  CREATININE 1.47* 1.55*   Recent Labs    01/24/17 1449  TROPONINI <0.03   Hepatic Function Panel No results for input(s): PROT, ALBUMIN, AST, ALT, ALKPHOS, BILITOT, BILIDIR, IBILI in the last 72 hours. Recent Labs    01/25/17 0443  CHOL 109     Imaging: CLINICAL DATA:  Pain radiating between the shoulder blades and in to the left arm  EXAM: PORTABLE CHEST 1 VIEW  COMPARISON:  Chest x-ray of 05/25/2009  FINDINGS: Chronic elevation of the right  hemidiaphragm is stable with mild right basilar volume loss. The left lung is clear. Mediastinal and hilar contours are unremarkable. Heart size is stable. No bony abnormality is seen.  IMPRESSION: Stable chronic elevation of the right hemidiaphragm. No definite active process.   Electronically Signed   By: Ivar Drape M.D.   On: 01/24/2017 12:55  Cardiac Studies: Echocardiogram 01/24/2017: Study Conclusions  - Left ventricle: The cavity size was normal. Wall thickness was   increased in a pattern of moderate LVH. There was moderate   concentric hypertrophy. Systolic function was normal. The   estimated ejection fraction was in the range of 50% to 55%. Wall   motion was normal; there were no regional wall motion   abnormalities. Doppler parameters are consistent with elevated   mean left atrial filling pressure. - Aortic valve: Moderately calcified annulus. Moderately calcified   leaflets. Left coronary cusp mobility was moderately restricted.   Inadequate Doppler data to accurately evaluate aortic valve   stenosis. There was mild to moderate regurgitation. - Mitral valve: There was mild regurgitation. - Left atrium: The atrium was moderately dilated. - Right atrium: The atrium was mildly dilated. - Tricuspid valve: There was moderate regurgitation. RA-RV gradient   30 mmhg - Inadequate visualization of IVC. Unable to estimate RA, and PASP.  Assessment:  81 year old male with AD status post CSI atherectomy and PCI to mid LAD in 07/2016, , mild aortic stenosis, remote  history of Afib, history of prostate cancer status post radiation 2011, Chypertension, CKD 3, Parkinsons disease, admitted with intermittent chest pain.  Tachycardia: Appears more organized flutter on telemetry this morning. Repeat EKG.  Chest pain: Likely supply demand mismatch in the tachycardia. No signs of myocardial injury on echocardiogram or troponin. Frequent PVCs and supraventricular  tachycardia Acute kidney injury: Likely due to atrial flutter and tachycardia Peripheral edema: Suspect this is due to venous insufficiency. Will workup outpatient. Known CAD Mild aortic stenosis CKD 3 Hypertension Parkinson's disease  Plan: Discussed with EP if candidate for flutter ablation. Given controlled rate, they recommended continued rate control and outpatient follow up after adequate anticoagulation. Continue metoprolol succinate 100 mg, diltiazem 30 mg every 6 hours. Will switch to CD 120 or 240 mg daily on discharge. Continue heparin for now. Will discharge home on xarelto and plavix, stop ASA. Continue metoprolol succinate 100 mg, triamterene HCTZ, losartan. IV hydration given his AKI. Continue statin. Continue baseline Parkinson's medications.      LOS: 0 days    [name] 01/25/2017, 7:54 AM   [place], MD [facility] [place] Pager: [phone] Office: [phone] If no answer Cell [phone]

## 2017-01-25 NOTE — H&P (View-Only) (Signed)
Poorly controlled atrial flutter. TEE with no LAA thrombus. Plan for cardioversion 11/29 morning at 7:30 AM.  [name], MD [facility]. [place] Pager: [phone] Office: [phone] If no answer Cell [phone]

## 2017-01-25 NOTE — Plan of Care (Signed)
Pt admitted for CP observation. Cardiac Enzymes remain negative, Pt has remained CP free, VS Stable. Heparin gtt currently infusing per pharmacy protocol. Pt is independent with ADL's. Pt provided information on Cardizem (new medication) all questions and concerns answered and/or addressed. Will continue to monitor. Jessie Foot, RN

## 2017-01-25 NOTE — Progress Notes (Signed)
  Echocardiogram Echocardiogram Transesophageal has been performed.  [name] 01/25/2017, 4:50 PM

## 2017-01-25 NOTE — Progress Notes (Signed)
ANTICOAGULATION CONSULT NOTE - Follow Up Consult  Pharmacy Consult for Heparin Indication: chest pain/ACS  No Known Allergies  Patient Measurements: Height: 5\' 11"  (180.3 cm) Weight: 207 lb (93.9 kg) IBW/kg (Calculated) : 75.3 Heparin Dosing Weight: 93 kg  Vital Signs: Temp: 97.6 F (36.4 C) (11/28 2037) Temp Source: Oral (11/28 2037) BP: 118/72 (11/28 2152) Pulse Rate: 71 (11/28 2037)  Labs: Recent Labs    01/24/17 1232 01/24/17 1449  01/25/17 0443 01/25/17 0817 01/25/17 1408 01/25/17 2208  HGB 12.4* 12.2*  --  11.7*  --   --   --   HCT 38.7* 38.1*  --  36.1*  --   --   --   PLT 144* 141*  --  143*  --   --   --   HEPARINUNFRC  --   --    < > 0.25*  --  0.38 0.26*  CREATININE 1.47*  --   --  1.55*  --   --   --   TROPONINI  --  <0.03  --   --  <0.03  --   --    < > = values in this interval not displayed.    Estimated Creatinine Clearance: 45.2 mL/min (A) (by C-G formula based on SCr of 1.55 mg/dL (H)).  Assessment: 81 yr old male on IV heparin for ACS/atrial fibrillation. Thought that chest pain related to supraventricular tachycardia, with cardiology mentioning potential EP evaluation for flutter ablation.     Confirmatory heparin level 0.26, slightly subtherapeutic. No interruption with infusion, no issue with lines per RN.  Goal of Therapy:  Heparin level 0.3-0.7 units/ml Monitor platelets by anticoagulation protocol: Yes   Plan:  Increase heparin rate slightly to 1750 units/hr F/u 8 hr heparin level at 0700 Daily heparin level and CBC Monitor for s/s bleeding  Follow up on plan for transition to Xarelto at discharge as mentioned in cardiology note  Maryanna Shape, PharmD, BCPS  Clinical Pharmacist  Pager: [phone]   01/25/17 10:56 PM

## 2017-01-25 NOTE — Progress Notes (Signed)
Inadequate rate control. Patient is NPO. Will proceed with TEE, and perform cardioversion hopefully tomorrow as per anesthesia availability.  [name], MD [facility]. [place] Pager: [phone] Office: [phone] If no answer Cell [phone]

## 2017-01-25 NOTE — Progress Notes (Addendum)
Subjective:  No chest pain Feels well this morning  Objective:  Vital Signs in the last 24 hours: Temp:  [97.7 F (36.5 C)-97.9 F (36.6 C)] 97.9 F (36.6 C) (11/28 0500) Pulse Rate:  [36-142] 72 (11/28 0500) Resp:  [14-22] 18 (11/28 0500) BP: (118-153)/(67-99) 125/67 (11/28 0500) SpO2:  [92 %-100 %] 98 % (11/28 0500) Weight:  [93.4 kg (206 lb)-94.2 kg (207 lb 9.6 oz)] 94.2 kg (207 lb 9.6 oz) (11/27 2110)  Intake/Output from previous day: 11/27 0701 - 11/28 0700 In: -  Out: 450 [Urine:450] Intake/Output from this shift: No intake/output data recorded.  Physical Exam: Nursing note and vitals reviewed. Constitutional: He is oriented to person, place, and time. He appears well-developed and well-nourished.  HENT:  Head: Normocephalic and atraumatic.  Neck: No JVD present.  Cardiovascular:  Murmur (II/VI aortic, crescendo\) heard. Controlled ventricular rate Respiratory: Effort normal and breath sounds normal. No respiratory distress. He has no wheezes. He has no rales.  GI: Soft. Bowel sounds are normal.  Musculoskeletal: He exhibits edema (2+ b/l. Varicose veins seen).  Neurological: He is alert and oriented to person, place, and time.  Skin: Skin is warm and dry.     Lab Results: Recent Labs    01/24/17 1449 01/25/17 0443  WBC 4.7 3.9*  HGB 12.2* 11.7*  PLT 141* 143*   Recent Labs    01/24/17 1232 01/25/17 0443  NA 137 137  K 4.0 4.2  CL 104 104  CO2 23 26  GLUCOSE 88 88  BUN 22* 27*  CREATININE 1.47* 1.55*   Recent Labs    01/24/17 1449  TROPONINI <0.03   Hepatic Function Panel No results for input(s): PROT, ALBUMIN, AST, ALT, ALKPHOS, BILITOT, BILIDIR, IBILI in the last 72 hours. Recent Labs    01/25/17 0443  CHOL 109     Imaging: CLINICAL DATA:  Pain radiating between the shoulder blades and in to the left arm  EXAM: PORTABLE CHEST 1 VIEW  COMPARISON:  Chest x-ray of 05/25/2009  FINDINGS: Chronic elevation of the right  hemidiaphragm is stable with mild right basilar volume loss. The left lung is clear. Mediastinal and hilar contours are unremarkable. Heart size is stable. No bony abnormality is seen.  IMPRESSION: Stable chronic elevation of the right hemidiaphragm. No definite active process.   Electronically Signed   By: Ivar Drape M.D.   On: 01/24/2017 12:55  Cardiac Studies: Echocardiogram 01/24/2017: Study Conclusions  - Left ventricle: The cavity size was normal. Wall thickness was   increased in a pattern of moderate LVH. There was moderate   concentric hypertrophy. Systolic function was normal. The   estimated ejection fraction was in the range of 50% to 55%. Wall   motion was normal; there were no regional wall motion   abnormalities. Doppler parameters are consistent with elevated   mean left atrial filling pressure. - Aortic valve: Moderately calcified annulus. Moderately calcified   leaflets. Left coronary cusp mobility was moderately restricted.   Inadequate Doppler data to accurately evaluate aortic valve   stenosis. There was mild to moderate regurgitation. - Mitral valve: There was mild regurgitation. - Left atrium: The atrium was moderately dilated. - Right atrium: The atrium was mildly dilated. - Tricuspid valve: There was moderate regurgitation. RA-RV gradient   30 mmhg - Inadequate visualization of IVC. Unable to estimate RA, and PASP.  Assessment:  81 year old male with AD status post CSI atherectomy and PCI to mid LAD in 07/2016, , mild aortic stenosis, remote  history of Afib, history of prostate cancer status post radiation 2011, Chypertension, CKD 3, Parkinsons disease, admitted with intermittent chest pain.  Tachycardia: Appears more organized flutter on telemetry this morning. Repeat EKG.  Chest pain: Likely supply demand mismatch in the tachycardia. No signs of myocardial injury on echocardiogram or troponin. Frequent PVCs and supraventricular  tachycardia Acute kidney injury: Likely due to atrial flutter and tachycardia Peripheral edema: Suspect this is due to venous insufficiency. Will workup outpatient. Known CAD Mild aortic stenosis CKD 3 Hypertension Parkinson's disease  Plan: Discussed with EP if candidate for flutter ablation. Given controlled rate, they recommended continued rate control and outpatient follow up after adequate anticoagulation. Continue metoprolol succinate 100 mg, diltiazem 30 mg every 6 hours. Will switch to CD 120 or 240 mg daily on discharge. Continue heparin for now. Will discharge home on xarelto and plavix, stop ASA. Continue metoprolol succinate 100 mg, triamterene HCTZ, losartan. IV hydration given his AKI. Continue statin. Continue baseline Parkinson's medications.      LOS: 0 days    [name] J Termaine Roupp 01/25/2017, 7:54 AM   [place], MD [facility] [place] Pager: [phone] Office: [phone] If no answer Cell [phone]

## 2017-01-25 NOTE — Care Management Obs Status (Signed)
[place] NOTIFICATION   Patient Details  Name: [name] MRN: [id number] Date of Birth: 02/25/1936   Medicare Observation Status Notification Given:  Yes    Erenest Rasher, RN 01/25/2017, 3:29 PM

## 2017-01-25 NOTE — Progress Notes (Signed)
ANTICOAGULATION CONSULT NOTE - Follow Up Consult  Pharmacy Consult for Heparin Indication: chest pain/ACS  No Known Allergies  Patient Measurements: Height: 5\' 11"  (180.3 cm) Weight: 207 lb 9.6 oz (94.2 kg) IBW/kg (Calculated) : 75.3 Heparin Dosing Weight: 93 kg  Vital Signs: Temp: 98 F (36.7 C) (11/28 1350) Temp Source: Oral (11/28 1350) BP: 132/67 (11/28 1350) Pulse Rate: 73 (11/28 1350)  Labs: Recent Labs    01/24/17 1232 01/24/17 1449 01/24/17 2123 01/25/17 0443 01/25/17 0817 01/25/17 1408  HGB 12.4* 12.2*  --  11.7*  --   --   HCT 38.7* 38.1*  --  36.1*  --   --   PLT 144* 141*  --  143*  --   --   HEPARINUNFRC  --   --  0.16* 0.25*  --  0.38  CREATININE 1.47*  --   --  1.55*  --   --   TROPONINI  --  <0.03  --   --  <0.03  --     Estimated Creatinine Clearance: 45.3 mL/min (A) (by C-G formula based on SCr of 1.55 mg/dL (H)).  Assessment: 81 yr old male on IV heparin for ACS/atrial fibrillation. Thought that chest pain related to supraventricular tachycardia, with cardiology mentioning potential EP evaluation for flutter ablation.     Heparin level came back at 0.38, on 1600 units/hr. CBC stable and no s/s bleeding noted. No infusion issues documented.  Goal of Therapy:  Heparin level 0.3-0.7 units/ml Monitor platelets by anticoagulation protocol: Yes   Plan:  Continue heparin gtt at 1600 units/hr Obtain 8 hr confirmatory level Daily heparin level and CBC Monitor for s/s bleeding  Follow up on plan for transition to Xarelto at discharge as mentioned in cardiology note  Doylene Canard, PharmD Clinical Pharmacist  Pager: [phone] Clinical Phone for 01/25/2017 until 3:30pm: [phone] If after 3:30pm, please call main pharmacy at [phone] 01/25/17 3:13 PM

## 2017-01-25 NOTE — Interval H&P Note (Signed)
History and Physical Interval Note:  01/25/2017 3:28 PM  [name]  has presented today for surgery, with the diagnosis of cp  The various methods of treatment have been discussed with the patient and family. After consideration of risks, benefits and other options for treatment, the patient has consented to  Procedure(s): LEFT HEART CATH AND CORONARY ANGIOGRAPHY (N/A) as a surgical intervention .  The patient's history has been reviewed, patient examined, no change in status, stable for surgery.  I have reviewed the patient's chart and labs.  Questions were answered to the patient's satisfaction.     Early

## 2017-01-25 NOTE — Progress Notes (Signed)
Poorly controlled atrial flutter. TEE with no LAA thrombus. Plan for cardioversion 11/29 morning at 7:30 AM.  [name], MD [facility]. [place] Pager: [phone] Office: [phone] If no answer Cell [phone]

## 2017-01-26 ENCOUNTER — Inpatient Hospital Stay (HOSPITAL_COMMUNITY): Payer: Medicare Other | Admitting: Anesthesiology

## 2017-01-26 ENCOUNTER — Encounter (HOSPITAL_COMMUNITY)
Admission: EM | Disposition: A | Discharge status: Home / Self Care | Payer: Self-pay | Source: Home / Self Care | Attending: Cardiology

## 2017-01-26 ENCOUNTER — Encounter (HOSPITAL_COMMUNITY): Payer: Self-pay | Admitting: Critical Care Medicine

## 2017-01-26 DIAGNOSIS — I4892 Unspecified atrial flutter: Secondary | ICD-10-CM | POA: Diagnosis not present

## 2017-01-26 HISTORY — PX: CARDIOVERSION: SHX1299

## 2017-01-26 LAB — CBC
HCT: 39.2 % (ref 39.0–52.0)
Hemoglobin: 12.6 g/dL — ABNORMAL LOW (ref 13.0–17.0)
MCH: 30 pg (ref 26.0–34.0)
MCHC: 32.1 g/dL (ref 30.0–36.0)
MCV: 93.3 fL (ref 78.0–100.0)
Platelets: 147 10*3/uL — ABNORMAL LOW (ref 150–400)
RBC: 4.2 MIL/uL — ABNORMAL LOW (ref 4.22–5.81)
RDW: 14.2 % (ref 11.5–15.5)
WBC: 4 10*3/uL (ref 4.0–10.5)

## 2017-01-26 LAB — BASIC METABOLIC PANEL
Anion gap: 7 (ref 5–15)
BUN: 21 mg/dL — ABNORMAL HIGH (ref 6–20)
CO2: 25 mmol/L (ref 22–32)
Calcium: 9 mg/dL (ref 8.9–10.3)
Chloride: 104 mmol/L (ref 101–111)
Creatinine, Ser: 1.54 mg/dL — ABNORMAL HIGH (ref 0.61–1.24)
GFR calc Af Amer: 48 mL/min — ABNORMAL LOW (ref >60)
GFR calc non Af Amer: 41 mL/min — ABNORMAL LOW (ref >60)
Glucose, Bld: 103 mg/dL — ABNORMAL HIGH (ref 65–99)
Potassium: 4.1 mmol/L (ref 3.5–5.1)
Sodium: 136 mmol/L (ref 135–145)

## 2017-01-26 LAB — HEPARIN LEVEL (UNFRACTIONATED): Heparin Unfractionated: 0.68 IU/mL (ref 0.30–0.70)

## 2017-01-26 SURGERY — CARDIOVERSION
Anesthesia: General

## 2017-01-26 MED ORDER — METOPROLOL SUCCINATE ER 100 MG PO TB24
100.0000 mg | ORAL_TABLET | Freq: Every day | ORAL | Status: DC
  Administered 2017-01-26 – 2017-01-27 (×2): 100 mg via ORAL
  Filled 2017-01-26 (×2): qty 1

## 2017-01-26 MED ORDER — DILTIAZEM HCL ER COATED BEADS 120 MG PO CP24: 120.0000 mg | ORAL_CAPSULE | Freq: Every day | ORAL | Status: DC

## 2017-01-26 MED ORDER — LIDOCAINE 2% (20 MG/ML) 5 ML SYRINGE
INTRAMUSCULAR | Status: DC | PRN
  Administered 2017-01-26: 60 mg via INTRAVENOUS

## 2017-01-26 MED ORDER — RIVAROXABAN 15 MG PO TABS
15.0000 mg | ORAL_TABLET | Freq: Every day | ORAL | Status: DC
  Administered 2017-01-26: 15 mg via ORAL
  Filled 2017-01-26 (×2): qty 1

## 2017-01-26 MED ORDER — SODIUM CHLORIDE 0.9 % IV SOLN
INTRAVENOUS | Status: DC | PRN
  Administered 2017-01-26: 07:00:00 via INTRAVENOUS

## 2017-01-26 MED ORDER — PROPOFOL 10 MG/ML IV BOLUS
INTRAVENOUS | Status: DC | PRN
  Administered 2017-01-26: 30 mg via INTRAVENOUS
  Administered 2017-01-26: 70 mg via INTRAVENOUS

## 2017-01-26 NOTE — Anesthesia Postprocedure Evaluation (Addendum)
Anesthesia Post Note  Patient: [name]  Procedure(s) Performed: CARDIOVERSION (N/A )     Patient location during evaluation: PACU Anesthesia Type: General Level of consciousness: awake and alert Pain management: pain level controlled Vital Signs Assessment: post-procedure vital signs reviewed and stable Respiratory status: spontaneous breathing, nonlabored ventilation, respiratory function stable and patient connected to nasal cannula oxygen Cardiovascular status: blood pressure returned to baseline and stable Postop Assessment: no apparent nausea or vomiting Anesthetic complications: no    Last Vitals:  Vitals:   01/26/17 0745 01/26/17 0756  BP: (!) 109/56 (!) 112/52  Pulse:    Resp: 18 18  Temp: 36.7 C   SpO2: 100% 100%    Last Pain:  Vitals:   01/26/17 0745  TempSrc: Oral  PainSc:                  Jal S

## 2017-01-26 NOTE — Progress Notes (Signed)
ANTICOAGULATION CONSULT NOTE - Follow Up Consult  Pharmacy Consult for Heparin -> Xarelto Indication: atrial fibrillation  No Known Allergies  Patient Measurements: Height: 5\' 11"  (180.3 cm) Weight: 206 lb (93.4 kg) IBW/kg (Calculated) : 75.3 Heparin Dosing Weight: 93 kg  Vital Signs: Temp: 98.1 F (36.7 C) (11/29 0745) Temp Source: Oral (11/29 0745) BP: 112/52 (11/29 0756) Pulse Rate: 65 (11/29 0744)  Labs: Recent Labs    01/24/17 1232 01/24/17 1449  01/25/17 0443 01/25/17 0817 01/25/17 1408 01/25/17 2208 01/26/17 0643  HGB 12.4* 12.2*  --  11.7*  --   --   --  12.6*  HCT 38.7* 38.1*  --  36.1*  --   --   --  39.2  PLT 144* 141*  --  143*  --   --   --  147*  HEPARINUNFRC  --   --    < > 0.25*  --  0.38 0.26* 0.68  CREATININE 1.47*  --   --  1.55*  --   --   --  1.54*  TROPONINI  --  <0.03  --   --  <0.03  --   --   --    < > = values in this interval not displayed.    Estimated Creatinine Clearance: 45.4 mL/min (A) (by C-G formula based on SCr of 1.54 mg/dL (H)).  Assessment:  81 yr old male on IV heparin for ACS/atrial fibrillation/atrial flutter.  S/p DCCV.     Heparin level therapeutic (0.68) on 1750 units/hr.  To transition to Xarelto. Reduced dose due to renal function.  Goal of Therapy:  Heparin level 0.3-0.7 units/ml appropriate Xarelto dose for indication and renal function Monitor platelets by anticoagulation protocol: Yes   Plan:   Xarelto 15 mg daily with supper (begin with lunch today).  Stop IV heparin when giving first dose of Xarelto.  Arty Baumgartner, [place] Pager: [phone] 01/26/2017,11:16 AM

## 2017-01-26 NOTE — Anesthesia Preprocedure Evaluation (Signed)
Anesthesia Evaluation  Patient identified by MRN, date of birth, ID band Patient awake    Reviewed: Allergy & Precautions, H&P , NPO status , Patient's Chart, lab work & pertinent test results  Airway Mallampati: II   Neck ROM: full    Dental   Pulmonary former smoker,    breath sounds clear to auscultation       Cardiovascular hypertension, + dysrhythmias Atrial Fibrillation + Valvular Problems/Murmurs AS  Rhythm:irregular Rate:Normal  Moderate AS   Neuro/Psych    GI/Hepatic   Endo/Other    Renal/GU Renal InsufficiencyRenal disease     Musculoskeletal  (+) Arthritis ,   Abdominal   Peds  Hematology   Anesthesia Other Findings   Reproductive/Obstetrics                             Anesthesia Physical Anesthesia Plan  ASA: III  Anesthesia Plan: General   Post-op Pain Management:    Induction: Intravenous  PONV Risk Score and Plan: 2 and Treatment may vary due to age or medical condition  Airway Management Planned: Mask  Additional Equipment:   Intra-op Plan:   Post-operative Plan:   Informed Consent: I have reviewed the patients History and Physical, chart, labs and discussed the procedure including the risks, benefits and alternatives for the proposed anesthesia with the patient or authorized representative who has indicated his/her understanding and acceptance.     Plan Discussed with: CRNA and Anesthesiologist  Anesthesia Plan Comments:         Anesthesia Quick Evaluation

## 2017-01-26 NOTE — Transfer of Care (Signed)
Immediate Anesthesia Transfer of Care Note  Patient: [name] ([name]) Tommi Emery  Procedure(s) Performed: CARDIOVERSION (N/A )  Patient Location: PACU and Endoscopy Unit  Anesthesia Type:MAC  Level of Consciousness: awake, alert , oriented and patient cooperative  Airway & Oxygen Therapy: Patient Spontanous Breathing and Patient connected to nasal cannula oxygen  Post-op Assessment: Report given to RN, Post -op Vital signs reviewed and stable and Patient moving all extremities  Post vital signs: Reviewed and stable  Last Vitals:  Vitals:   01/26/17 0743 01/26/17 0744  BP:  (!) 115/55  Pulse: 65 65  Resp: 15 17  Temp:    SpO2: 100% 100%    Last Pain:  Vitals:   01/26/17 0711  TempSrc: Oral  PainSc:          Complications: No apparent anesthesia complications

## 2017-01-26 NOTE — CV Procedure (Signed)
Direct current cardioversion:  Indication: Symptomatic poorly rate controlled atrial flutter  Procedure: Propofol administered and monitored by anesthesia. Synchronized direct current cardioversion performed under deep sedation. Patient was delivered with 100 Joules of electricity X 1 with success to NSR. Patient tolerated the procedure well. No immediate complication noted.   [name], MD [facility]. [place] Pager: [phone] Office: [phone] If no answer Cell [phone]

## 2017-01-26 NOTE — Progress Notes (Signed)
Subjective:  Feels okay this morning. No chest pain No shortness of breath.   Objective:  Vital Signs in the last 24 hours: Temp:  [97.6 F (36.4 C)-98.4 F (36.9 C)] 98.1 F (36.7 C) (11/29 0711) Pulse Rate:  [32-147] 74 (11/29 0506) Resp:  [15-25] 18 (11/29 0711) BP: (105-178)/(49-125) 132/64 (11/29 0711) SpO2:  [97 %-100 %] 97 % (11/29 0711) Weight:  [93.4 kg (206 lb)-93.9 kg (207 lb)] 93.4 kg (206 lb) (11/29 0711)  Intake/Output from previous day: 11/28 0701 - 11/29 0700 In: 168 [I.V.:168] Out: 400 [Urine:400] Intake/Output from this shift: No intake/output data recorded.  Physical Exam: Nursing noteand vitalsreviewed. Constitutional: He isoriented to person, place, and time. He appearswell-developedand well-nourished.  HENT:  Head:Normocephalicand atraumatic.  Neck:No JVDpresent.  Cardiovascular: Murmur(II/VI aortic, crescendo\)heard. Atrial flutter with RVR Respiratory:Effort normaland breath sounds normal. Norespiratory distress. He hasno wheezes. He hasno rales.  TI:WPYK.Bowel sounds are normal.  Musculoskeletal: He exhibitsedema(2+ b/l. Varicose veins seen).  Neurological: He isalertand oriented to person, place, and time.  Skin: Skin iswarmand dry.     Lab Results: Recent Labs    01/24/17 1449 01/25/17 0443  WBC 4.7 3.9*  HGB 12.2* 11.7*  PLT 141* 143*   Recent Labs    01/24/17 1232 01/25/17 0443  NA 137 137  K 4.0 4.2  CL 104 104  CO2 23 26  GLUCOSE 88 88  BUN 22* 27*  CREATININE 1.47* 1.55*   Recent Labs    01/24/17 1449 01/25/17 0817  TROPONINI <0.03 <0.03   Hepatic Function Panel No results for input(s): PROT, ALBUMIN, AST, ALT, ALKPHOS, BILITOT, BILIDIR, IBILI in the last 72 hours. Recent Labs    01/25/17 0443  CHOL 109   No results for input(s): PROTIME in the last 72 hours.   Imaging: CLINICAL DATA: Pain radiating between the shoulder blades and in to the left arm  EXAM: PORTABLE CHEST  1 VIEW  COMPARISON: Chest x-ray of 05/25/2009  FINDINGS: Chronic elevation of the right hemidiaphragm is stable with mild right basilar volume loss. The left lung is clear. Mediastinal and hilar contours are unremarkable. Heart size is stable. No bony abnormality is seen.  IMPRESSION: Stable chronic elevation of the right hemidiaphragm. No definite active process.    Cardiac Studies: 01/26/2017 TEE: Under moderate sedation, TEE was performed without complications: LV: Normal. Normal EF. RV: Normal LA: Normal. Left atrial appendage: Spontaneous echo contrast without thrombus. Mildly reduced velocities. Inter atrial septum is intact without defect. Double contrast study negative for atrial level shunting. No late appearance of bubbles either. RA: Normal MV: Normal. Mild MR. TV: Normal. Mild TR AV: Moderate calcification. Mild AS, Mild AI. PV: Normal. Trace PI.  Successful cardiovoersion 100 J   Assessment:  81 year old male with AD status post CSI atherectomy and PCI to mid LAD in 07/2016,,mild aortic stenosis, remote history of Afib, history of prostate cancer status post radiation 2011, Chypertension, CKD 3, Parkinsons disease, admitted with intermittent chest pain.  Atrial flutter: Now in sinus rhythm s/p successful cardioversion. Chest pain: Likely supply demand mismatch. Now resolved. Acute kidney injury: Likely due to atrial flutter and tachycardia Peripheral edema: Suspect this is due to venous insufficiency. Will workup outpatient. Known CAD Mild aortic stenosis CKD 3 Hypertension Parkinson's disease  Plan: Continue metoprolol succinate 100 mg and diltiazem 30 mg q6hr. Probably will discharge on as needed diltiazem 30 mg.  Start renally adjusted dose of xarelto today. Will discharge home on xarelto and plavix, stop ASA. Continue triamterene HCTZ,losartan. IV  hydration given his AKI. Continue statin. Continue baseline Parkinson's  medications. Possible discharge alter today or tomorrow.    LOS: 1 day    Ryler Laskowski J Dudley Cooley 01/26/2017, 7:29 AM  [place], MD [facility]. [place] Pager: [phone] Office: [phone] If no answer Cell [phone]

## 2017-01-26 NOTE — Care Management Note (Addendum)
Case Management Note  Patient Details  Name: [name] MRN: [id number] Date of Birth: 1935/12/20  Subjective/Objective: Pt presented for Chest Pain- Cardioversion 01-26-17                   Action/Plan: Benefits Check for Xarelto vs Eliquis Completed:  [facility] @ [facility] # [id number]   * PATIENT NEED TO UPDATE INS WITH ADMISSION *    1, XARELTO 20 MG BID   COVER- YES  CO-PAY- $ 42.00  TIER- 3 DRUG  PRIOR APPROVAL- NO    2. ELIQUIS 2.5 MG BID   COVER- YES  CO-PAY- $ 42.00  TIER- 3 DRUG  PRIOR APPROVAL- NO   3. ELIQUIS 5 MG BID   COVER- YES  CO-PAY- $ 42.00  TIER- 3 DRUG  PRIOR APPROVAL- NO   PREFERRED PHARMACY: [facility] AND [facility]   Expected Discharge Date:                  Expected Discharge Plan:  Home/Self Care  In-House Referral:     Discharge planning Services  CM Consult, Medication Assistance  Post Acute Care Choice:   HOME WITH SELF CARE Choice offered to:   N/A  DME Arranged:   N/A DME Agency:   N/A  HH Arranged:   N/A HH Agency:   N/A  Status of Service: Completed  If discussed at Long Length of Stay Meetings, dates discussed:    Additional Comments: 30 day free Xarelto card provided to patient. 1046 01-27-17.   Bethena Roys, RN 01/26/2017, 1:52 PM

## 2017-01-26 NOTE — Interval H&P Note (Signed)
History and Physical Interval Note:  01/26/2017 7:45 AM  [name]  has presented today for surgery, with the diagnosis of atrial flutter  The various methods of treatment have been discussed with the patient and family. After consideration of risks, benefits and other options for treatment, the patient has consented to  Procedure(s): CARDIOVERSION (N/A) as a surgical intervention .  The patient's history has been reviewed, patient examined, no change in status, stable for surgery.  I have reviewed the patient's chart and labs.  Questions were answered to the patient's satisfaction.     [place]

## 2017-01-26 NOTE — Discharge Instructions (Signed)

## 2017-01-27 LAB — BASIC METABOLIC PANEL
Anion gap: 6 (ref 5–15)
BUN: 22 mg/dL — ABNORMAL HIGH (ref 6–20)
CO2: 26 mmol/L (ref 22–32)
Calcium: 8.9 mg/dL (ref 8.9–10.3)
Chloride: 104 mmol/L (ref 101–111)
Creatinine, Ser: 1.54 mg/dL — ABNORMAL HIGH (ref 0.61–1.24)
GFR calc Af Amer: 48 mL/min — ABNORMAL LOW (ref >60)
GFR calc non Af Amer: 41 mL/min — ABNORMAL LOW (ref >60)
Glucose, Bld: 94 mg/dL (ref 65–99)
Potassium: 4.3 mmol/L (ref 3.5–5.1)
Sodium: 136 mmol/L (ref 135–145)

## 2017-01-27 MED ORDER — RIVAROXABAN 15 MG PO TABS: 15.0000 mg | ORAL_TABLET | Freq: Every day | ORAL | 3 refills | Status: DC

## 2017-01-27 NOTE — Progress Notes (Signed)
[facility] [employer] PREP Weekly Session   Patient Details  Name: [name] MRN: [id number] Date of Birth: 08-29-1935 Age: 81 y.o. PCP: Dineen Kid, MD  Vitals:   01/11/17 1135  Weight: 202 lb (91.6 kg)    [name] [employer] Weekly seesion - 01/27/17 1100      Weekly Session   Topic Discussed  Eating for the season    Minutes exercised this week  -- "ok" to all 3 categories   "ok" to all 3 categories   Classes attended to date  4      Fun things you did since last meeting:"exercise" Things you are grateful for:"waking up" Barriers:"back, neck, legs"    [name] 01/27/2017, 11:35 AM

## 2017-01-27 NOTE — Discharge Summary (Addendum)
Physician Discharge Summary  Patient ID: [name]) [name] MRN: [id number] DOB/AGE: 79-21-1937 79 y.o.  Admit date: 01/24/2017 Discharge date: 01/27/2017  Admission Diagnoses: Chest pain Mild aortic stenosis CKD 3 Hypertension Parkinson's disease H/o prostate cancer H/o melanoma  Discharge Diagnoses:  Atrial flutter, underwent TEE and cardioversion CHA2DS2VASc score 3, annual stroke risk 3.2% Chest pain, likely supply demand mismatch due to atrial flutter No myocardial infarction/injury.  Mild aortic stenosis CKD 3 Venous insufficiency w/edema CEAP class 3/S Hypertension Parkinson's disease H/o prostate cancer H/o melanoma  Discharged Condition: good  Hospital Course:  81 year old male with AD status post CSI atherectomy and PCI to mid LAD in 07/2016,,mild aortic stenosis, remote history of Afib, history of prostate cancer status post radiation 2011, Chypertension, CKD 3, Parkinsons disease, admitted with intermittent chest pain.  He was found to be in atrial flutter. His chest pain was secondary to supply demand mismatch from atrial flutter. MI was ruled out. He underwent TEE and cardioversion with successful conversion to sinus rhythm. His creatinine was slightly increased from 1.47-1.55, and decreased 1.54 on discharge. This may be his new baseline creatinine.  He was started on anticoagulation with Xarelto 15 mg once daily. His aspirin was stopped given 5 months since his PCI and increased risk for triple therapy. He should continue Xarelto on Plavix.  He has venous insufficiency with edema for which I will prescribe compression stockings for 3 months. He should follow up with his PCP regarding his advancing CKD and monitor his creatinine closely. I will check follow up labs in first week of December.  I will see him on December 14 at 1:30 PM for transition of care follow up. I will also make a referral to EP Dr. Caryl Comes for consideration of atrial flutter  ablation.   Consults: None  Significant Diagnostic Studies: TEE 01/25/2017 Study Conclusions  - Left ventricle: The cavity size was normal. The estimated   ejection fraction was in the range of 50% to 55%. - Aortic valve: There was mild stenosis. There was mild   regurgitation. - Mitral valve: There was mild regurgitation. - Left atrium: The atrium was mildly dilated. No evidence of   thrombus in the atrial cavity or appendage.  Results for [name], Other ([name]) [name] "[name]" (MRN [id number]) as of 01/27/2017 08:51  Ref. Range 01/25/2017 04:43 01/25/2017 08:17 01/26/2017 06:43 01/27/2017 95:62  BASIC METABOLIC PANEL Unknown Rpt (A)  Rpt (A) Rpt (A)  Sodium Latest Ref Range: 135 - 145 mmol/L 137  136 136  Potassium Latest Ref Range: 3.5 - 5.1 mmol/L 4.2  4.1 4.3  Chloride Latest Ref Range: 101 - 111 mmol/L 104  104 104  CO2 Latest Ref Range: 22 - 32 mmol/L 26  25 26   Glucose Latest Ref Range: 65 - 99 mg/dL 88  103 (H) 94  BUN Latest Ref Range: 6 - 20 mg/dL 27 (H)  21 (H) 22 (H)  Creatinine Latest Ref Range: 0.61 - 1.24 mg/dL 1.55 (H)  1.54 (H) 1.54 (H)  Calcium Latest Ref Range: 8.9 - 10.3 mg/dL 8.9  9.0 8.9  Anion gap Latest Ref Range: 5 - 15  7  7 6   GFR, Est Non African American Latest Ref Range: >60 mL/min 41 (L)  41 (L) 41 (L)  GFR, Est African American Latest Ref Range: >60 mL/min 47 (L)  48 (L) 48 (L)    Results for Baranek, Lucile ([name]) [name] "[name]" (MRN [id number]) as of 01/27/2017 08:51  Ref. Range 01/24/2017 14:49 01/25/2017 04:43  01/26/2017 06:43  WBC Latest Ref Range: 4.0 - 10.5 K/uL 4.7 3.9 (L) 4.0  RBC Latest Ref Range: 4.22 - 5.81 MIL/uL 4.09 (L) 3.94 (L) 4.20 (L)  Hemoglobin Latest Ref Range: 13.0 - 17.0 g/dL 12.2 (L) 11.7 (L) 12.6 (L)  HCT Latest Ref Range: 39.0 - 52.0 % 38.1 (L) 36.1 (L) 39.2  MCV Latest Ref Range: 78.0 - 100.0 fL 93.2 91.6 93.3  MCH Latest Ref Range: 26.0 - 34.0 pg 29.8 29.7 30.0  MCHC Latest Ref Range: 30.0 - 36.0 g/dL 32.0 32.4 32.1   RDW Latest Ref Range: 11.5 - 15.5 % 13.9 13.7 14.2  Platelets Latest Ref Range: 150 - 400 K/uL 141 (L) 143 (L) 147 (L)    Treatments:  Direct current cardioversion:  Indication: Symptomatic poorly rate controlled atrial flutter  Procedure: Propofol administered and monitored by anesthesia. Synchronized direct current cardioversion performed under deep sedation. Patient was delivered with 100 Joules of electricity X 1 with success to NSR. Patient tolerated the procedure well. No immediate complication noted.   [name], MD [facility] [place] Pager: [phone] Office: [phone] If no answer Cell [phone]     Discharge Exam: Blood pressure 124/61, pulse 63, temperature 98 F (36.7 C), temperature source Oral, resp. rate (!) 31, height 5\' 11"  (1.803 m), weight 93.4 kg (206 lb), SpO2 96 %.  Nursing noteand vitalsreviewed. Constitutional: He isoriented to person, place, and time. He appearswell-developedand well-nourished.  HENT:  Head:Normocephalicand atraumatic.  Neck:No JVDpresent.  Cardiovascular: Murmur(II/VI aortic, crescendo\)heard. Atrial flutter with RVR Respiratory:Effort normaland breath sounds normal. Norespiratory distress. He hasno wheezes. He hasno rales.  QQ:PYPP.Bowel sounds are normal.  Musculoskeletal: He exhibitsedema(2+ b/l. Varicose veins seen).  Neurological: He isalertand oriented to person, place, and time.  Skin: Skin iswarmand dry.     Disposition: 01-Home or Self Care  Discharge Instructions    Diet - low sodium heart healthy   Complete by:  As directed    Increase activity slowly   Complete by:  As directed      Allergies as of 01/27/2017   No Known Allergies     Medication List    STOP taking these medications   aspirin EC 81 MG tablet     TAKE these medications   carbidopa-levodopa 25-100 MG tablet Commonly known as:  SINEMET IR TAKE 1 TABLET THREE TIMES DAILY    cholecalciferol 1000 units tablet Commonly known as:  VITAMIN D Take 1,000 Units by mouth daily.   clopidogrel 75 MG tablet Commonly known as:  PLAVIX Take 75 mg by mouth daily.   losartan 50 MG tablet Commonly known as:  COZAAR Take 50 mg by mouth daily.   metoprolol succinate 100 MG 24 hr tablet Commonly known as:  TOPROL-XL Take 100 mg by mouth daily. Take with or immediately following a meal.   PRESERVISION AREDS Caps Take 1 capsule by mouth 2 (two) times daily.   Rivaroxaban 15 MG Tabs tablet Commonly known as:  XARELTO Take 1 tablet (15 mg total) by mouth daily with supper.   rosuvastatin 10 MG tablet Commonly known as:  CRESTOR Take 10 mg by mouth at bedtime.   SALONPAS GEL EX Apply 1 application topically as needed (back pain).   tamsulosin 0.4 MG Caps capsule Commonly known as:  FLOMAX Take 0.4 mg by mouth at bedtime.   triamterene-hydrochlorothiazide 37.5-25 MG tablet Commonly known as:  MAXZIDE-25 Take 1 tablet by mouth daily.   vitamin E 400 UNIT capsule Take 400 Units by  mouth daily.      Follow-up Information    [name], MD Follow up on 02/10/2017.   Specialty:  Cardiology Why:  1:30 PM Contact information: [address] [place] [address] [phone]           Signed: [name] 01/27/2017, 8:43 AM   [name], MD [facility] [place] Pager: [phone] Office: [phone] If no answer Cell [phone]

## 2017-01-27 NOTE — Plan of Care (Signed)
  Progressing Education: Understanding of cardiac disease, CV risk reduction, and recovery process will improve 01/27/2017 0535 - Progressing by Colonel Bald, RN Note Pt voices understanding. 01/27/2017 0533 - Progressing by Colonel Bald, RN Clinical Measurements: Ability to maintain clinical measurements within normal limits will improve 01/27/2017 0533 - Progressing by Colonel Bald, RN Education: Knowledge of disease or condition will improve 01/27/2017 0535 - Progressing by Colonel Bald, RN Note Pt voices understanding. 01/27/2017 0533 - Progressing by Colonel Bald, RN Understanding of medication regimen will improve 01/27/2017 0535 by Colonel Bald, RN Note Pt voices understanding. 01/27/2017 0533 - Progressing by Colonel Bald, RN

## 2017-02-02 DIAGNOSIS — I10 Essential (primary) hypertension: Secondary | ICD-10-CM | POA: Diagnosis not present

## 2017-02-10 DIAGNOSIS — R0609 Other forms of dyspnea: Secondary | ICD-10-CM | POA: Diagnosis not present

## 2017-02-10 DIAGNOSIS — E782 Mixed hyperlipidemia: Secondary | ICD-10-CM | POA: Diagnosis not present

## 2017-02-10 DIAGNOSIS — I25119 Atherosclerotic heart disease of native coronary artery with unspecified angina pectoris: Secondary | ICD-10-CM | POA: Diagnosis not present

## 2017-02-10 DIAGNOSIS — I4892 Unspecified atrial flutter: Secondary | ICD-10-CM | POA: Diagnosis not present

## 2017-02-10 DIAGNOSIS — N183 Chronic kidney disease, stage 3 (moderate): Secondary | ICD-10-CM | POA: Diagnosis not present

## 2017-02-10 DIAGNOSIS — I10 Essential (primary) hypertension: Secondary | ICD-10-CM | POA: Diagnosis not present

## 2017-02-10 DIAGNOSIS — I2510 Atherosclerotic heart disease of native coronary artery without angina pectoris: Secondary | ICD-10-CM | POA: Diagnosis not present

## 2017-02-10 DIAGNOSIS — G20 Parkinson's disease: Secondary | ICD-10-CM | POA: Diagnosis not present

## 2017-02-11 ENCOUNTER — Encounter (HOSPITAL_COMMUNITY): Payer: Self-pay | Admitting: Emergency Medicine

## 2017-02-11 ENCOUNTER — Inpatient Hospital Stay (HOSPITAL_COMMUNITY)
Admission: EM | Admit: 2017-02-11 | Discharge: 2017-02-13 | DRG: 310 | Disposition: A | Discharge status: Home / Self Care | Payer: Medicare Other | Attending: Cardiology | Admitting: Cardiology

## 2017-02-11 ENCOUNTER — Emergency Department (HOSPITAL_COMMUNITY): Payer: Medicare Other

## 2017-02-11 DIAGNOSIS — I2510 Atherosclerotic heart disease of native coronary artery without angina pectoris: Secondary | ICD-10-CM | POA: Diagnosis present

## 2017-02-11 DIAGNOSIS — I080 Rheumatic disorders of both mitral and aortic valves: Secondary | ICD-10-CM | POA: Diagnosis present

## 2017-02-11 DIAGNOSIS — I129 Hypertensive chronic kidney disease with stage 1 through stage 4 chronic kidney disease, or unspecified chronic kidney disease: Secondary | ICD-10-CM | POA: Diagnosis present

## 2017-02-11 DIAGNOSIS — Z85820 Personal history of malignant melanoma of skin: Secondary | ICD-10-CM

## 2017-02-11 DIAGNOSIS — Z87442 Personal history of urinary calculi: Secondary | ICD-10-CM

## 2017-02-11 DIAGNOSIS — I484 Atypical atrial flutter: Secondary | ICD-10-CM | POA: Diagnosis not present

## 2017-02-11 DIAGNOSIS — G20 Parkinson's disease: Secondary | ICD-10-CM | POA: Diagnosis not present

## 2017-02-11 DIAGNOSIS — R000 Tachycardia, unspecified: Secondary | ICD-10-CM | POA: Diagnosis not present

## 2017-02-11 DIAGNOSIS — Z8249 Family history of ischemic heart disease and other diseases of the circulatory system: Secondary | ICD-10-CM

## 2017-02-11 DIAGNOSIS — N183 Chronic kidney disease, stage 3 (moderate): Secondary | ICD-10-CM | POA: Diagnosis not present

## 2017-02-11 DIAGNOSIS — Z87891 Personal history of nicotine dependence: Secondary | ICD-10-CM

## 2017-02-11 DIAGNOSIS — Z923 Personal history of irradiation: Secondary | ICD-10-CM

## 2017-02-11 DIAGNOSIS — I350 Nonrheumatic aortic (valve) stenosis: Secondary | ICD-10-CM | POA: Diagnosis not present

## 2017-02-11 DIAGNOSIS — Z961 Presence of intraocular lens: Secondary | ICD-10-CM | POA: Diagnosis present

## 2017-02-11 DIAGNOSIS — I471 Supraventricular tachycardia: Secondary | ICD-10-CM | POA: Diagnosis present

## 2017-02-11 DIAGNOSIS — I480 Paroxysmal atrial fibrillation: Secondary | ICD-10-CM | POA: Diagnosis not present

## 2017-02-11 DIAGNOSIS — Z7901 Long term (current) use of anticoagulants: Secondary | ICD-10-CM

## 2017-02-11 DIAGNOSIS — Z9841 Cataract extraction status, right eye: Secondary | ICD-10-CM

## 2017-02-11 DIAGNOSIS — Z8546 Personal history of malignant neoplasm of prostate: Secondary | ICD-10-CM

## 2017-02-11 DIAGNOSIS — I10 Essential (primary) hypertension: Secondary | ICD-10-CM | POA: Diagnosis not present

## 2017-02-11 DIAGNOSIS — I4892 Unspecified atrial flutter: Secondary | ICD-10-CM | POA: Diagnosis present

## 2017-02-11 DIAGNOSIS — Z7902 Long term (current) use of antithrombotics/antiplatelets: Secondary | ICD-10-CM

## 2017-02-11 DIAGNOSIS — R079 Chest pain, unspecified: Secondary | ICD-10-CM | POA: Diagnosis not present

## 2017-02-11 DIAGNOSIS — Z9842 Cataract extraction status, left eye: Secondary | ICD-10-CM

## 2017-02-11 DIAGNOSIS — Z955 Presence of coronary angioplasty implant and graft: Secondary | ICD-10-CM

## 2017-02-11 DIAGNOSIS — E7800 Pure hypercholesterolemia, unspecified: Secondary | ICD-10-CM | POA: Diagnosis present

## 2017-02-11 LAB — CBC
HCT: 40.3 % (ref 39.0–52.0)
Hemoglobin: 13.2 g/dL (ref 13.0–17.0)
MCH: 30.2 pg (ref 26.0–34.0)
MCHC: 32.8 g/dL (ref 30.0–36.0)
MCV: 92.2 fL (ref 78.0–100.0)
Platelets: 159 10*3/uL (ref 150–400)
RBC: 4.37 MIL/uL (ref 4.22–5.81)
RDW: 13.5 % (ref 11.5–15.5)
WBC: 6 10*3/uL (ref 4.0–10.5)

## 2017-02-11 LAB — BASIC METABOLIC PANEL
Anion gap: 10 (ref 5–15)
BUN: 24 mg/dL — ABNORMAL HIGH (ref 6–20)
CO2: 23 mmol/L (ref 22–32)
Calcium: 8.9 mg/dL (ref 8.9–10.3)
Chloride: 102 mmol/L (ref 101–111)
Creatinine, Ser: 1.61 mg/dL — ABNORMAL HIGH (ref 0.61–1.24)
GFR calc Af Amer: 45 mL/min — ABNORMAL LOW (ref >60)
GFR calc non Af Amer: 39 mL/min — ABNORMAL LOW (ref >60)
Glucose, Bld: 148 mg/dL — ABNORMAL HIGH (ref 65–99)
Potassium: 4 mmol/L (ref 3.5–5.1)
Sodium: 135 mmol/L (ref 135–145)

## 2017-02-11 LAB — POCT I-STAT TROPONIN I: Troponin i, poc: 0.02 ng/mL (ref 0.00–0.08)

## 2017-02-11 LAB — I-STAT TROPONIN, ED: Troponin i, poc: 0.01 ng/mL (ref 0.00–0.08)

## 2017-02-11 MED ORDER — METOPROLOL SUCCINATE ER 100 MG PO TB24
100.0000 mg | ORAL_TABLET | Freq: Every day | ORAL | Status: DC
  Administered 2017-02-12 – 2017-02-13 (×2): 100 mg via ORAL
  Filled 2017-02-11 (×2): qty 1

## 2017-02-11 MED ORDER — DILTIAZEM HCL ER COATED BEADS 120 MG PO CP24
120.0000 mg | ORAL_CAPSULE | Freq: Every day | ORAL | Status: DC
  Administered 2017-02-11 – 2017-02-13 (×3): 120 mg via ORAL
  Filled 2017-02-11 (×3): qty 1

## 2017-02-11 MED ORDER — ACETAMINOPHEN 325 MG PO TABS: 650.0000 mg | ORAL_TABLET | ORAL | Status: DC | PRN

## 2017-02-11 MED ORDER — LOSARTAN POTASSIUM 25 MG PO TABS: 25.0000 mg | ORAL_TABLET | Freq: Every day | ORAL | Status: DC

## 2017-02-11 MED ORDER — LOSARTAN POTASSIUM 25 MG PO TABS
25.0000 mg | ORAL_TABLET | Freq: Every day | ORAL | Status: DC
  Administered 2017-02-11 – 2017-02-12 (×2): 25 mg via ORAL
  Filled 2017-02-11 (×2): qty 1

## 2017-02-11 MED ORDER — PROPOFOL 10 MG/ML IV BOLUS
0.5000 mg/kg | INTRAVENOUS | Status: DC | PRN
  Filled 2017-02-11: qty 20

## 2017-02-11 MED ORDER — ROSUVASTATIN CALCIUM 10 MG PO TABS
10.0000 mg | ORAL_TABLET | Freq: Every day | ORAL | Status: DC
  Administered 2017-02-11 – 2017-02-12 (×2): 10 mg via ORAL
  Filled 2017-02-11 (×2): qty 1

## 2017-02-11 MED ORDER — NITROGLYCERIN 2 % TD OINT
1.0000 [in_us] | TOPICAL_OINTMENT | Freq: Four times a day (QID) | TRANSDERMAL | Status: DC
  Administered 2017-02-11 (×2): 1 [in_us] via TOPICAL
  Filled 2017-02-11: qty 30
  Filled 2017-02-11: qty 1

## 2017-02-11 MED ORDER — CLOPIDOGREL BISULFATE 75 MG PO TABS
75.0000 mg | ORAL_TABLET | Freq: Every day | ORAL | Status: DC
  Administered 2017-02-12 – 2017-02-13 (×2): 75 mg via ORAL
  Filled 2017-02-11 (×2): qty 1

## 2017-02-11 MED ORDER — TAMSULOSIN HCL 0.4 MG PO CAPS
0.4000 mg | ORAL_CAPSULE | Freq: Every day | ORAL | Status: DC
  Administered 2017-02-11 – 2017-02-12 (×2): 0.4 mg via ORAL
  Filled 2017-02-11 (×2): qty 1

## 2017-02-11 MED ORDER — LOSARTAN POTASSIUM 50 MG PO TABS: 50.0000 mg | ORAL_TABLET | Freq: Every day | ORAL | Status: DC

## 2017-02-11 MED ORDER — TRIAMTERENE-HCTZ 37.5-25 MG PO TABS
1.0000 | ORAL_TABLET | Freq: Every day | ORAL | Status: DC
  Administered 2017-02-12 – 2017-02-13 (×2): 1 via ORAL
  Filled 2017-02-11 (×2): qty 1

## 2017-02-11 MED ORDER — ONDANSETRON HCL 4 MG/2ML IJ SOLN: 4.0000 mg | Freq: Four times a day (QID) | INTRAMUSCULAR | Status: DC | PRN

## 2017-02-11 MED ORDER — RIVAROXABAN 15 MG PO TABS
15.0000 mg | ORAL_TABLET | Freq: Every day | ORAL | Status: DC
  Administered 2017-02-11 – 2017-02-12 (×2): 15 mg via ORAL
  Filled 2017-02-11 (×2): qty 1

## 2017-02-11 MED ORDER — DILTIAZEM HCL ER COATED BEADS 120 MG PO CP24: 120.0000 mg | ORAL_CAPSULE | Freq: Every day | ORAL | 3 refills | Status: DC

## 2017-02-11 MED ORDER — ASPIRIN 81 MG PO CHEW
324.0000 mg | CHEWABLE_TABLET | Freq: Once | ORAL | Status: DC
  Filled 2017-02-11: qty 4

## 2017-02-11 MED ORDER — CARBIDOPA-LEVODOPA 25-100 MG PO TABS
1.0000 | ORAL_TABLET | Freq: Three times a day (TID) | ORAL | Status: DC
  Administered 2017-02-11 – 2017-02-13 (×5): 1 via ORAL
  Filled 2017-02-11 (×7): qty 1

## 2017-02-11 NOTE — ED Notes (Signed)
Patient's wife asking for update.  Dr. Tomi Bamberger states he updated patient on POC.  Went in to ask patient about his understanding, and everything clarified.  Awaiting cardiology.

## 2017-02-11 NOTE — ED Notes (Signed)
Cardiology at bedside.

## 2017-02-11 NOTE — ED Notes (Signed)
Called pharmacy to follow up on Cardizem, will send.

## 2017-02-11 NOTE — H&P (Addendum)
[name] is an 81 y.o. male.   Chief Complaint: Chest pain HPI:   81 year old male with AD status post CSI atherectomy and PCI to mid LAD in 07/2016, , mild aortic stenosis, remote history of Afib, history of prostate cancer status post radiation 2011, Chypertension, CKD 3, Parkinsons disease.  Current episode started this morning. Patient exercises this morning and to call his morning medications. Just before noon, he had sudden onset of chest pain across his chest and shoulders. This was similar to his episode 2 weeks ago where he had similar pain and was found to be in atrial flutter. On admission to the ED, patient was in atrial flutter. EKG did not show any ischemic changes. First troponin was negative. Diltiazem drip was started. On arrival to the ED, patient is in atrial flutter with 4-1 AV conduction. He has nitro paste on. He does not have any chest pain at this time.   I had seen him in the office on 02/10/2017. At that time, he was also in atrial flutter with variable AV conduction with controlled ventricular rate. I had recommended nuclear stress test to evaluate for ischemia. He has an appointment with Dr. Caryl Comes on Tuesday 02/14/2017 to discuss if you be a candidate for catheter ablation.   Past Medical History:  Diagnosis Date  . Arthritis    "knees; lower back" (08/09/2016)  . Atrial fibrillation ([place])    remote hx/notes 08/07/2016  . Episodes of trembling    "most of the time it's my left hand" (08/09/2016)  . High cholesterol   . History of kidney stones   . Hypertension   . Melanoma of shoulder ([place]) 2000s   "left"  . Mitral regurgitation and mitral stenosis    hx/notes 08/07/2016  . Moderate aortic stenosis    hx/notes 08/07/2016  . Prostate cancer ([place]) 2011   hx/notes 08/07/2016    Past Surgical History:  Procedure Laterality Date  . CARDIAC CATHETERIZATION  ~ [address] [place]"  . CARDIOVERSION N/A 01/26/2017   Procedure: CARDIOVERSION;  Surgeon:  [name], MD;  Location: [facility];  Service: Cardiovascular;  Laterality: N/A;  . CATARACT EXTRACTION W/ INTRAOCULAR LENS  IMPLANT, BILATERAL Bilateral 2014-02/2016   left-right; hx/notes 08/07/2016  . CORONARY ANGIOPLASTY WITH STENT PLACEMENT  08/09/2016  . CORONARY ATHERECTOMY N/A 08/09/2016   Procedure: Coronary Atherectomy;  Surgeon: Adrian Prows, MD;  Location: [place] [facility];  Service: Cardiovascular;  Laterality: N/A;  . CORONARY STENT INTERVENTION N/A 08/09/2016   Procedure: Coronary Stent Intervention;  Surgeon: Adrian Prows, MD;  Location: [place] [facility];  Service: Cardiovascular;  Laterality: N/A;  LAD  . EYE SURGERY Bilateral 05/2016   "laser on both lenses"  . INGUINAL HERNIA REPAIR Left 1991  . INSERTION PROSTATE RADIATION SEED  06/25/2009  . INTRAVASCULAR PRESSURE WIRE/FFR STUDY N/A 08/09/2016   Procedure: Intravascular Pressure Wire/FFR Study;  Surgeon: Adrian Prows, MD;  Location: [place];  Service: Cardiovascular;  Laterality: N/A;  . LAPAROSCOPIC CHOLECYSTECTOMY  1993  . MELANOMA EXCISION Left 1990s   "shoulder"  . RIGHT/LEFT HEART CATH AND CORONARY ANGIOGRAPHY N/A 08/09/2016   Procedure: Right/Left Heart Cath and Coronary Angiography;  Surgeon: Adrian Prows, MD;  Location: [place] [facility];  Service: Cardiovascular;  Laterality: N/A;  . TEE WITHOUT CARDIOVERSION N/A 01/25/2017   Procedure: TRANSESOPHAGEAL ECHOCARDIOGRAM (TEE);  Surgeon: [name], MD;  Location: [facility];  Service: Cardiovascular;  Laterality: N/A;    Family History  Problem Relation Age of Onset  . Heart attack Father   . Cerebral aneurysm Sister    Social History:  reports that he quit smoking about 60 years ago. He quit after 0.20 years of use. he has never used smokeless tobacco. He reports that he drinks about 8.4 oz of alcohol per week. He reports that he does not use drugs.  Allergies: No Known Allergies   (Not in a hospital admission)  Results for  orders placed or performed during the hospital encounter of 02/11/17 (from the past 48 hour(s))  Basic metabolic panel     Status: Abnormal   Collection Time: 02/11/17 12:41 PM  Result Value Ref Range   Sodium 135 135 - 145 mmol/L   Potassium 4.0 3.5 - 5.1 mmol/L   Chloride 102 101 - 111 mmol/L   CO2 23 22 - 32 mmol/L   Glucose, Bld 148 (H) 65 - 99 mg/dL   BUN 24 (H) 6 - 20 mg/dL   Creatinine, Ser 1.61 (H) 0.61 - 1.24 mg/dL   Calcium 8.9 8.9 - 10.3 mg/dL   GFR calc non Af Amer 39 (L) >60 mL/min   GFR calc Af Amer 45 (L) >60 mL/min    Comment: (NOTE) The eGFR has been calculated using the CKD EPI equation. This calculation has not been validated in all clinical situations. eGFR's persistently <60 mL/min signify possible Chronic Kidney Disease.    Anion gap 10 5 - 15  CBC     Status: None   Collection Time: 02/11/17 12:41 PM  Result Value Ref Range   WBC 6.0 4.0 - 10.5 K/uL   RBC 4.37 4.22 - 5.81 MIL/uL   Hemoglobin 13.2 13.0 - 17.0 g/dL   HCT 40.3 39.0 - 52.0 %   MCV 92.2 78.0 - 100.0 fL   MCH 30.2 26.0 - 34.0 pg   MCHC 32.8 30.0 - 36.0 g/dL   RDW 13.5 11.5 - 15.5 %   Platelets 159 150 - 400 K/uL  I-Stat Troponin, ED (not at [facility])     Status: None   Collection Time: 02/11/17 12:51 PM  Result Value Ref Range   Troponin i, poc 0.01 0.00 - 0.08 ng/mL   Comment 3            Comment: Due to the release kinetics of cTnI, a negative result within the first hours of the onset of symptoms does not rule out myocardial infarction with certainty. If myocardial infarction is still suspected, repeat the test at appropriate intervals.    Dg Chest Portable 1 View  Result Date: 02/11/2017 CLINICAL DATA:  Bilateral chest pain EXAM: PORTABLE CHEST 1 VIEW COMPARISON:  01/24/2017 FINDINGS: Cardiac shadow is stable. Aortic calcifications are again seen. Elevation right hemidiaphragm is again noted. No focal infiltrate or sizable effusion is seen. No bony abnormality is noted. IMPRESSION: No  active disease. Electronically Signed   By: Inez Catalina M.D.   On: 02/11/2017 13:18    Review of Systems  Constitutional: Positive for malaise/fatigue.  HENT: Negative.   Eyes: Negative.   Respiratory: Negative for cough, sputum production and shortness of breath.   Cardiovascular: Positive for chest pain. Negative for palpitations, orthopnea, claudication, leg swelling (Improved with compression stockings) and PND.  Gastrointestinal: Negative for abdominal pain.  Genitourinary: Positive for frequency. Negative for hematuria and urgency.  Musculoskeletal: Negative for myalgias.  Skin: Negative for itching and rash.  Neurological: Negative for dizziness, loss of consciousness and headaches.  Endo/Heme/Allergies: Does not bruise/bleed easily.  Psychiatric/Behavioral: Negative for depression.  All other systems reviewed and are negative.   Blood pressure 118/74, pulse 74, resp. rate 16, height '5\' 11"'  (1.803 m), weight 93.4 kg (206 lb), SpO2 99 %. Physical Exam  Nursing note and vitals reviewed. Constitutional: He is oriented to person, place, and time. He appears well-developed and well-nourished. No distress.  HENT:  Head: Normocephalic and atraumatic.  Mouth/Throat: Oropharynx is clear and moist.  Eyes: Conjunctivae are normal. Pupils are equal, round, and reactive to light.  Neck: Normal range of motion. Neck supple. No JVD present.  Cardiovascular: Normal rate and regular rhythm.  Murmur (II/VI aortic area crescendo murmur) heard. Respiratory: Effort normal. He has wheezes. He has no rales.  GI: Soft. Bowel sounds are normal. He exhibits no distension.  Lymphadenopathy:    He has no cervical adenopathy.  Neurological: He is alert and oriented to person, place, and time. No cranial nerve deficit.  Skin: Skin is warm and dry.    EKG 02/11/2017: Atrial flutter with variable AV conduction. No ischemic changes.   Study Conclusions  - Left ventricle: The cavity size was normal.  Wall thickness was   increased in a pattern of moderate LVH. There was moderate   concentric hypertrophy. Systolic function was normal. The   estimated ejection fraction was in the range of 50% to 55%. Wall   motion was normal; there were no regional wall motion   abnormalities. Doppler parameters are consistent with elevated   mean left atrial filling pressure. - Aortic valve: Moderately calcified annulus. Moderately calcified   leaflets. Left coronary cusp mobility was moderately restricted.   Inadequate Doppler data to accurately evaluate aortic valve   stenosis. There was mild to moderate regurgitation. - Mitral valve: There was mild regurgitation. - Left atrium: The atrium was moderately dilated. - Right atrium: The atrium was mildly dilated. - Tricuspid valve: There was moderate regurgitation. RA-RV gradient   30 mmhg - Inadequate visualization of IVC. Unable to estimate RA, and PASP.  Assessment:  81 year old male with CAD status post CSI atherectomy and PCI to mid LAD in 07/2016, , mild aortic stenosis, remote history of Afib, history of prostate cancer status post radiation 2011, Chypertension, CKD 3, Parkinsons disease, admitted with intermittent chest pain.  Chest pain: Likely supply demand mismatch in the setting of supraventricular tachycardia. No signs of myocardial injury on echocardiogram or troponin. Atrial flutter with variable conductionar tachycardia. CHA2DS2VASc score 3 annual stroke risk 3.2% Known CAD Mild aortic stenosis CKD 3 Hypertension Parkinson's disease  Plan: Start diltiazem CD 120 mg. Stop diltiazem drip 2 hrs after PO diltiazdm dose. Continue metoprolol succinate 100 mg, triamterene HCTZ, losartan 25 mg Continue plavix and xarelto Continue baseline Parkinson's medications.  If ventricular rate stays controlled after weaning off IV diltiazem, and if patient remains chest pain-free after weaning off Nitropaste, we'll discharge him tomorrow  morning. We will perform outpatient stress test on Monday 02/13/2017, and consultation visit with Dr. Caryl Comes on Tuesday 02/14/2017 as planned. However, if patient has recurrent rapid ventricular response, may need cardioversion.  [name], MD 02/11/2017, 2:20 PM    Dorrell Mitcheltree Esther Hardy, MD [facility] Pager: [phone] Office: [phone] If no answer Cell [phone]

## 2017-02-11 NOTE — ED Notes (Signed)
Report given to rn on 6e 

## 2017-02-11 NOTE — ED Triage Notes (Signed)
Per EMS:  Pt called 911 from home after experiencing sudden onset sharp, central chest pain and SOB at approx 10am.  When EMS arrived, HR was approx 160.  Vagal maneuvers allowed them to see an underlying rhythm of a-fib.  Pt has a hx of a-fib and SVT, and was recently told they recommended an ablation, but no appointment scheduled at this time.  Pt given 10mg  IV push of cardizem en route with a slow drip behind, HR is now fluctuating between Sinus with bigeminy and a-fib with a rate approx 100.   Pt is on Xarelto and Plavix, but no home maintenance meds for a-fib or rate control.

## 2017-02-11 NOTE — ED Notes (Signed)
Phlebotomy at bedside.  EDP at bedside

## 2017-02-11 NOTE — ED Provider Notes (Signed)
[facility] EMERGENCY DEPARTMENT Provider Note   CSN: [id number] Arrival date & time: 02/11/17  1230     History   Chief Complaint Chief Complaint  Patient presents with  . Chest Pain    HPI [name] is a 81 y.o. male.  HPI Patient presents to the emergency room with complaints of chest pain.  Patient has a history of recurrent atrial fibrillation.  He was recently in the hospital couple weeks ago.  He was cardioverted and discharged.  Patient saw his cardiologist yesterday in the office and he was unfortunately back in atrial fibrillation.  Plan was for him to see an EP cardiologist for possible ablation procedure.  He was also going to have an outpatient stress test.  Patient states today he had sudden onset of severe chest pain going back.  He felt like he was going to die.  He called 911.  EMS noted that he was very tachycardic and gave him Cardizem.  His heart rate has improved significantly and is actually feeling much better now. Past Medical History:  Diagnosis Date  . Arthritis    "knees; lower back" (08/09/2016)  . Atrial fibrillation ([place])    remote hx/notes 08/07/2016  . Episodes of trembling    "most of the time it's my left hand" (08/09/2016)  . High cholesterol   . History of kidney stones   . Hypertension   . Melanoma of shoulder ([place]) 2000s   "left"  . Mitral regurgitation and mitral stenosis    hx/notes 08/07/2016  . Moderate aortic stenosis    hx/notes 08/07/2016  . Prostate cancer ([place]) 2011   hx/notes 08/07/2016    Patient Active Problem List   Diagnosis Date Noted  . Atrial flutter ([place]) 02/11/2017  . Chest pain 01/24/2017  . Post PTCA 08/09/2016  . Dyspnea on exertion 08/07/2016  . Coronary artery calcification seen on CAT scan 08/07/2016    Past Surgical History:  Procedure Laterality Date  . CARDIAC CATHETERIZATION  ~ [address] [place]"  . CARDIOVERSION N/A 01/26/2017   Procedure: CARDIOVERSION;  Surgeon:  [name], MD;  Location: [facility];  Service: Cardiovascular;  Laterality: N/A;  . CATARACT EXTRACTION W/ INTRAOCULAR LENS  IMPLANT, BILATERAL Bilateral 2014-02/2016   left-right; hx/notes 08/07/2016  . CORONARY ANGIOPLASTY WITH STENT PLACEMENT  08/09/2016  . CORONARY ATHERECTOMY N/A 08/09/2016   Procedure: Coronary Atherectomy;  Surgeon: Adrian Prows, MD;  Location: [facility];  Service: Cardiovascular;  Laterality: N/A;  . CORONARY STENT INTERVENTION N/A 08/09/2016   Procedure: Coronary Stent Intervention;  Surgeon: Adrian Prows, MD;  Location: [facility];  Service: Cardiovascular;  Laterality: N/A;  LAD  . EYE SURGERY Bilateral 05/2016   "laser on both lenses"  . INGUINAL HERNIA REPAIR Left 1991  . INSERTION PROSTATE RADIATION SEED  06/25/2009  . INTRAVASCULAR PRESSURE WIRE/FFR STUDY N/A 08/09/2016   Procedure: Intravascular Pressure Wire/FFR Study;  Surgeon: Adrian Prows, MD;  Location: [facility];  Service: Cardiovascular;  Laterality: N/A;  . LAPAROSCOPIC CHOLECYSTECTOMY  1993  . MELANOMA EXCISION Left 1990s   "shoulder"  . RIGHT/LEFT HEART CATH AND CORONARY ANGIOGRAPHY N/A 08/09/2016   Procedure: Right/Left Heart Cath and Coronary Angiography;  Surgeon: Adrian Prows, MD;  Location: [facility];  Service: Cardiovascular;  Laterality: N/A;  . TEE WITHOUT CARDIOVERSION N/A 01/25/2017   Procedure: TRANSESOPHAGEAL ECHOCARDIOGRAM (TEE);  Surgeon: [name], MD;  Location: [facility];  Service: Cardiovascular;  Laterality:  N/A;       Home Medications    Prior to Admission medications   Medication Sig Start Date End Date Taking? Authorizing Provider  Capsaicin-Menthol (SALONPAS GEL EX) Apply 1 application topically as needed (back pain).    [provider]  carbidopa-levodopa (SINEMET IR) 25-100 MG tablet TAKE 1 TABLET THREE TIMES DAILY 11/14/16   Tat, Eustace Quail, DO  cholecalciferol (VITAMIN D) 1000 units tablet Take 1,000 Units by  mouth daily.    [provider]  clopidogrel (PLAVIX) 75 MG tablet Take 75 mg by mouth daily.    [provider]  losartan (COZAAR) 50 MG tablet Take 50 mg by mouth daily.    [provider]  metoprolol succinate (TOPROL-XL) 100 MG 24 hr tablet Take 100 mg by mouth daily. Take with or immediately following a meal.    [provider]  Multiple Vitamins-Minerals (PRESERVISION AREDS) CAPS Take 1 capsule by mouth 2 (two) times daily.    [provider]  Rivaroxaban (XARELTO) 15 MG TABS tablet Take 1 tablet (15 mg total) by mouth daily with supper. 01/27/17   Patwardhan, Reynold Bowen, MD  rosuvastatin (CRESTOR) 10 MG tablet Take 10 mg by mouth at bedtime.    [provider]  tamsulosin (FLOMAX) 0.4 MG CAPS capsule Take 0.4 mg by mouth at bedtime.    [provider]  triamterene-hydrochlorothiazide (MAXZIDE-25) 37.5-25 MG tablet Take 1 tablet by mouth daily.    [provider]  vitamin E 400 UNIT capsule Take 400 Units by mouth daily.    [provider]    Family History Family History  Problem Relation Age of Onset  . Heart attack Father   . Cerebral aneurysm Sister     Social History Social History   Tobacco Use  . Smoking status: Former Smoker    Years: 0.20    Last attempt to quit: 1958    Years since quitting: 60.9  . Smokeless tobacco: Never Used  . Tobacco comment: "smoked  2 months when I was 20; non since" (08/09/2016)  Substance Use Topics  . Alcohol use: Yes    Alcohol/week: 8.4 oz    Types: 14 Shots of liquor per week    Comment: 2 drinks per day (canadian mist)  . Drug use: No     Allergies   Patient has no known allergies.   Review of Systems Review of Systems  All other systems reviewed and are negative.    Physical Exam Updated Vital Signs BP 122/78   Pulse 72   Resp 11   Ht 1.803 m (5\' 11" )   Wt 93.4 kg (206 lb)   SpO2 100%   BMI 28.73 kg/m   Physical Exam    Constitutional: He appears well-developed and well-nourished. No distress.  HENT:  Head: Normocephalic and atraumatic.  Right Ear: External ear normal.  Left Ear: External ear normal.  Eyes: Conjunctivae are normal. Right eye exhibits no discharge. Left eye exhibits no discharge. No scleral icterus.  Neck: Neck supple. No tracheal deviation present.  Cardiovascular: Intact distal pulses. An irregularly irregular rhythm present. Tachycardia present.  Pulmonary/Chest: Effort normal and breath sounds normal. No stridor. No respiratory distress. He has no wheezes. He has no rales.  Abdominal: Soft. Bowel sounds are normal. He exhibits no distension. There is no tenderness. There is no rebound and no guarding.  Musculoskeletal: He exhibits no edema or tenderness.  Neurological: He is alert. He has normal strength. No cranial nerve deficit (no  facial droop, extraocular movements intact, no slurred speech) or sensory deficit. He exhibits normal muscle tone. He displays no seizure activity. Coordination normal.  Skin: Skin is warm and dry. No rash noted.  Psychiatric: He has a normal mood and affect.  Nursing note and vitals reviewed.    ED Treatments / Results  Labs (all labs ordered are listed, but only abnormal results are displayed) Labs Reviewed  BASIC METABOLIC PANEL - Abnormal; Notable for the following components:      Result Value   Glucose, Bld 148 (*)    BUN 24 (*)    Creatinine, Ser 1.61 (*)    GFR calc non Af Amer 39 (*)    GFR calc Af Amer 45 (*)    All other components within normal limits  CBC  I-STAT TROPONIN, ED  I-STAT TROPONIN, ED    EKG  EKG Interpretation  Date/Time:  Saturday February 11 2017 12:36:19 EST Ventricular Rate:  112 PR Interval:    QRS Duration: 96 QT Interval:  368 QTC Calculation: 425 R Axis:   -2 Text Interpretation:  Atrial fibrillation Ventricular premature complex Borderline ST elevation, lateral leads atrial fibrillation has replaced  atrial flutter compared to last tracing Confirmed by Dorie Rank [phone]) on 02/11/2017 12:38:41 PM       Radiology Dg Chest Portable 1 View  Result Date: 02/11/2017 CLINICAL DATA:  Bilateral chest pain EXAM: PORTABLE CHEST 1 VIEW COMPARISON:  01/24/2017 FINDINGS: Cardiac shadow is stable. Aortic calcifications are again seen. Elevation right hemidiaphragm is again noted. No focal infiltrate or sizable effusion is seen. No bony abnormality is noted. IMPRESSION: No active disease. Electronically Signed   By: Inez Catalina M.D.   On: 02/11/2017 13:18    Procedures Procedures (including critical care time)  Medications Ordered in ED Medications  aspirin chewable tablet 324 mg (324 mg Oral Not Given 02/11/17 1311)  nitroGLYCERIN (NITROGLYN) 2 % ointment 1 inch (1 inch Topical Given 02/11/17 1311)  propofol (DIPRIVAN) 10 mg/mL bolus/IV push 46.7 mg (not administered)  acetaminophen (TYLENOL) tablet 650 mg (not administered)  ondansetron (ZOFRAN) injection 4 mg (not administered)  carbidopa-levodopa (SINEMET IR) 25-100 MG per tablet immediate release 1 tablet (not administered)  clopidogrel (PLAVIX) tablet 75 mg (not administered)  losartan (COZAAR) tablet 50 mg (not administered)  metoprolol succinate (TOPROL-XL) 24 hr tablet 100 mg (not administered)  Rivaroxaban (XARELTO) tablet 15 mg (not administered)  tamsulosin (FLOMAX) capsule 0.4 mg (not administered)  triamterene-hydrochlorothiazide (MAXZIDE-25) 37.5-25 MG per tablet 1 tablet (not administered)  diltiazem (CARDIZEM CD) 24 hr capsule 120 mg (not administered)     Initial Impression / Assessment and Plan / ED Course  I have reviewed the triage vital signs and the nursing notes.  Pertinent labs & imaging results that were available during my care of the patient were reviewed by me and considered in my medical decision making (see chart for details).   Pt presented to the ED with recurrent a fib tachycardia.  Pt already had  outpatient plans for possible ablation.  EMS treated with cardizem and started him on an infusion.  He remained stable in the ED. signs of acute cardiac injury pt was seen by his cardiologist, Dr Virgina Jock.  Will admit to the hospital for observation  Final Clinical Impressions(s) / ED Diagnoses   Final diagnoses:  Atrial flutter with rapid ventricular response ([place])      Dorie Rank, MD 02/11/17 1441

## 2017-02-11 NOTE — ED Notes (Signed)
Patient's wife made this RN aware that patient is not supposed to take METOPROLOL Sunday OR Monday in preparation for his stress test on Monday.

## 2017-02-11 NOTE — ED Notes (Signed)
Pharmacy at bedside

## 2017-02-11 NOTE — ED Notes (Signed)
Radiology at bedside

## 2017-02-12 ENCOUNTER — Other Ambulatory Visit: Payer: Self-pay

## 2017-02-12 DIAGNOSIS — Z9841 Cataract extraction status, right eye: Secondary | ICD-10-CM | POA: Diagnosis not present

## 2017-02-12 DIAGNOSIS — Z8546 Personal history of malignant neoplasm of prostate: Secondary | ICD-10-CM | POA: Diagnosis not present

## 2017-02-12 DIAGNOSIS — Z87442 Personal history of urinary calculi: Secondary | ICD-10-CM | POA: Diagnosis not present

## 2017-02-12 DIAGNOSIS — I4892 Unspecified atrial flutter: Secondary | ICD-10-CM | POA: Diagnosis not present

## 2017-02-12 DIAGNOSIS — I129 Hypertensive chronic kidney disease with stage 1 through stage 4 chronic kidney disease, or unspecified chronic kidney disease: Secondary | ICD-10-CM | POA: Diagnosis present

## 2017-02-12 DIAGNOSIS — N183 Chronic kidney disease, stage 3 (moderate): Secondary | ICD-10-CM | POA: Diagnosis present

## 2017-02-12 DIAGNOSIS — I480 Paroxysmal atrial fibrillation: Secondary | ICD-10-CM | POA: Diagnosis not present

## 2017-02-12 DIAGNOSIS — Z7902 Long term (current) use of antithrombotics/antiplatelets: Secondary | ICD-10-CM | POA: Diagnosis not present

## 2017-02-12 DIAGNOSIS — G20 Parkinson's disease: Secondary | ICD-10-CM | POA: Diagnosis present

## 2017-02-12 DIAGNOSIS — Z955 Presence of coronary angioplasty implant and graft: Secondary | ICD-10-CM | POA: Diagnosis not present

## 2017-02-12 DIAGNOSIS — Z8249 Family history of ischemic heart disease and other diseases of the circulatory system: Secondary | ICD-10-CM | POA: Diagnosis not present

## 2017-02-12 DIAGNOSIS — Z85820 Personal history of malignant melanoma of skin: Secondary | ICD-10-CM | POA: Diagnosis not present

## 2017-02-12 DIAGNOSIS — I484 Atypical atrial flutter: Secondary | ICD-10-CM | POA: Diagnosis present

## 2017-02-12 DIAGNOSIS — I350 Nonrheumatic aortic (valve) stenosis: Secondary | ICD-10-CM | POA: Diagnosis not present

## 2017-02-12 DIAGNOSIS — Z87891 Personal history of nicotine dependence: Secondary | ICD-10-CM | POA: Diagnosis not present

## 2017-02-12 DIAGNOSIS — I080 Rheumatic disorders of both mitral and aortic valves: Secondary | ICD-10-CM | POA: Diagnosis present

## 2017-02-12 DIAGNOSIS — E7800 Pure hypercholesterolemia, unspecified: Secondary | ICD-10-CM | POA: Diagnosis present

## 2017-02-12 DIAGNOSIS — Z7901 Long term (current) use of anticoagulants: Secondary | ICD-10-CM | POA: Diagnosis not present

## 2017-02-12 DIAGNOSIS — Z961 Presence of intraocular lens: Secondary | ICD-10-CM | POA: Diagnosis present

## 2017-02-12 DIAGNOSIS — I471 Supraventricular tachycardia: Secondary | ICD-10-CM | POA: Diagnosis present

## 2017-02-12 DIAGNOSIS — Z923 Personal history of irradiation: Secondary | ICD-10-CM | POA: Diagnosis not present

## 2017-02-12 DIAGNOSIS — I10 Essential (primary) hypertension: Secondary | ICD-10-CM | POA: Diagnosis not present

## 2017-02-12 DIAGNOSIS — I2510 Atherosclerotic heart disease of native coronary artery without angina pectoris: Secondary | ICD-10-CM | POA: Diagnosis present

## 2017-02-12 DIAGNOSIS — Z9842 Cataract extraction status, left eye: Secondary | ICD-10-CM | POA: Diagnosis not present

## 2017-02-12 LAB — BASIC METABOLIC PANEL
Anion gap: 6 (ref 5–15)
BUN: 21 mg/dL — ABNORMAL HIGH (ref 6–20)
CO2: 25 mmol/L (ref 22–32)
Calcium: 8.7 mg/dL — ABNORMAL LOW (ref 8.9–10.3)
Chloride: 104 mmol/L (ref 101–111)
Creatinine, Ser: 1.59 mg/dL — ABNORMAL HIGH (ref 0.61–1.24)
GFR calc Af Amer: 46 mL/min — ABNORMAL LOW (ref >60)
GFR calc non Af Amer: 40 mL/min — ABNORMAL LOW (ref >60)
Glucose, Bld: 92 mg/dL (ref 65–99)
Potassium: 4.1 mmol/L (ref 3.5–5.1)
Sodium: 135 mmol/L (ref 135–145)

## 2017-02-12 NOTE — Progress Notes (Addendum)
Subjective:  Doing well at rest but gets tachycardic and lightheaded with minimal activity.  Objective:  Vital Signs in the last 24 hours: Temp:  [97.9 F (36.6 C)-98.3 F (36.8 C)] 98.3 F (36.8 C) (12/16 0608) Pulse Rate:  [50-74] 73 (12/16 0608) Resp:  [11-20] 18 (12/16 0608) BP: (106-130)/(58-92) 130/71 (12/16 0608) SpO2:  [96 %-100 %] 97 % (12/16 0608) Weight:  [89.9 kg (198 lb 4.8 oz)-94.4 kg (208 lb 1.6 oz)] 89.9 kg (198 lb 4.8 oz) (12/16 5170)  Intake/Output from previous day: 12/15 0701 - 12/16 0700 In: -  Out: 600 [Urine:600] Intake/Output from this shift: Total I/O In: 360 [P.O.:360] Out: 150 [Urine:150]  Physical Exam: Nursing note and vitals reviewed. Constitutional: He is oriented to person, place, and time. He appears well-developed and well-nourished. No distress.  HENT:  Head: Normocephalic and atraumatic.  Mouth/Throat: Oropharynx is clear and moist.  Eyes: Conjunctivae are normal. Pupils are equal, round, and reactive to light.  Neck: Normal range of motion. Neck supple. No JVD present.  Cardiovascular: Normal rate and regular rhythm.  Murmur (II/VI aortic area crescendo murmur) heard. Respiratory: Effort normal. He has wheezes. He has no rales.  GI: Soft. Bowel sounds are normal. He exhibits no distension.  Lymphadenopathy:    He has no cervical adenopathy.  Neurological: He is alert and oriented to person, place, and time. No cranial nerve deficit.  Skin: Skin is warm and dry.     Lab Results: Recent Labs    02/11/17 1241  WBC 6.0  HGB 13.2  PLT 159   Recent Labs    02/11/17 1241  NA 135  K 4.0  CL 102  CO2 23  GLUCOSE 148*  BUN 24*  CREATININE 1.61*    Imaging: CXR 02/11/2017 Study Result   CLINICAL DATA:  Bilateral chest pain  EXAM: PORTABLE CHEST 1 VIEW  COMPARISON:  01/24/2017  FINDINGS: Cardiac shadow is stable. Aortic calcifications are again seen. Elevation right hemidiaphragm is again noted. No focal  infiltrate or sizable effusion is seen. No bony abnormality is noted.  IMPRESSION: No active disease.   Electronically Signed   By: Inez Catalina M.D.   On: 02/11/2017 13:18     Cardiac Studies: EKG 02/12/2017 Atypical atrial flutter with 4:1 conduction  Assessment:  81 year old male with CAD status post CSI atherectomy and PCI to mid LAD in 07/2016,,mild aortic stenosis, remote history of Afib, history of prostate cancer status post radiation 2011, Chypertension, CKD 3, Parkinsons disease, admitted with intermittent chest pain.  Chest pain: Likely supply demand mismatch in the setting of supraventricular tachycardia. No signs of myocardial injury  Atrial flutter with variable conductionar tachycardia. CHA2DS2VASc score 3 annual stroke risk 3.2% Symptomatic with RVR, with lightheadedness. Resting HR in 70s and 80s with RVR in 110s on minimal activity, Known CAD Mild aortic stenosis CKD 3 Hypertension Parkinson's disease H/o melanoma  Plan: Started diltiazem CD 120 mg on 02/11/2017. Continue metoprolol succinate 100 mg, triamterene HCTZ,losartan 25 mg Continue plavix and xarelto Continue baseline Parkinson's medications.  Patient had an appt to see Dr Caryl Comes on 12/18 for consideration for catheter ablation. I will check with EP service if patient could be seen while inpatient. I had planned for outpatient stress test on 02/13/17 given his chest pain episodes. However, they always seem to correlate with his RVR episodes without any evidence of myocardial ischemia. Can hold of stress test for now.    LOS: 0 days    Manish J Patwardhan 02/12/2017, 8:58  AM  [place], MD [facility] [place] Pager: [phone] Office: [phone] If no answer Cell [phone]

## 2017-02-13 NOTE — Discharge Summary (Signed)
Physician Discharge Summary  Patient ID: [name] MRN: [id number] DOB/AGE: Jan 21, 1936 81 y.o.  Admit date: 02/11/2017 Discharge date: 02/13/2017  Admission Diagnoses: Atrial flutter   Discharge Diagnoses:  Persistent atrial flutter with variable conductionar tachycardia. CHA2DS2VASc score 3 annual stroke risk 3.2% S/p successful cardioverion 01/26/2017, now with recurrent atrial flutter Known CAD Mild aortic stenosis CKD 3 Hypertension Parkinson's disease H/o melanoma    Discharged Condition: good  Hospital Course:  Patient was admitted on 02/11/2017 with episodes of chest pain associated with tachycardia, along with lightheadedness..  Similar position similar presentation to his recent hospital admission.  No evidence of myocardial ischemia on EKG or cardiac biomarkers.  Patient in 3-1 or 66 AV conduction with atypical atrial flutter.  Added diltiazem CD 120 mg in addition to metoprolol succinate 100 mg.  Symptoms improved at discharge.  With increase AV node blockade, ventricular rate remains at 70s at rest and less than 100 on activity.  Patient has appointment with Dr. Caryl Comes on 02/14/2017 for consideration for atrial flutter ablation.  Consults: None  Significant Diagnostic Studies: labs:  Results for [name], [name] (MRN [id number]) as of 02/13/2017 13:39  Ref. Range 02/12/2017 78:58  BASIC METABOLIC PANEL Unknown Rpt (A)  Sodium Latest Ref Range: 135 - 145 mmol/L 135  Potassium Latest Ref Range: 3.5 - 5.1 mmol/L 4.1  Chloride Latest Ref Range: 101 - 111 mmol/L 104  CO2 Latest Ref Range: 22 - 32 mmol/L 25  Glucose Latest Ref Range: 65 - 99 mg/dL 92  BUN Latest Ref Range: 6 - 20 mg/dL 21 (H)  Creatinine Latest Ref Range: 0.61 - 1.24 mg/dL 1.59 (H)  Calcium Latest Ref Range: 8.9 - 10.3 mg/dL 8.7 (L)  Anion gap Latest Ref Range: 5 - 15  6  GFR, Est Non African American Latest Ref Range: >60 mL/min 40 (L)  GFR, Est African American Latest Ref  Range: >60 mL/min 46 (L)    Treatments:  Added Diltiazem CD 120 mg  Discharge Exam: Blood pressure 131/76, pulse 75, temperature 98.1 F (36.7 C), temperature source Oral, resp. rate 20, height 5\' 11"  (1.803 m), weight 92.2 kg (203 lb 3.2 oz), SpO2 95 %. Nursing noteand vitalsreviewed. Constitutional: He isoriented to person, place, and time. He appearswell-developedand well-nourished.  HENT:  Head:Normocephalicand atraumatic.  Neck:No JVDpresent.  Cardiovascular: Murmur(II/VI aortic, crescendo\)heard. Atrial flutter with RVR Respiratory:Effort normaland breath sounds normal. Norespiratory distress. He hasno wheezes. He hasno rales.  IF:OYDX.Bowel sounds are normal.  Musculoskeletal: He exhibitsedema(2+ b/l. Varicose veins seen).  Neurological: He isalertand oriented to person, place, and time.  Skin: Skin iswarmand dry.    Disposition: 01-Home or Self Care  Discharge Instructions    Diet - low sodium heart healthy   Complete by:  As directed    Increase activity slowly   Complete by:  As directed      Allergies as of 02/13/2017      Reactions   Nsaids Other (See Comments)   Cannot have any of these because he is currently taking Xarelto      Medication List    TAKE these medications   carbidopa-levodopa 25-100 MG tablet Commonly known as:  SINEMET IR TAKE 1 TABLET THREE TIMES DAILY What changed:    how much to take  how to take this  when to take this   clopidogrel 75 MG tablet Commonly known as:  PLAVIX Take 75 mg by mouth daily.   diltiazem 120 MG 24 hr capsule Commonly known as:  CARDIZEM CD Take 1 capsule (120 mg total) by mouth daily.   losartan 50 MG tablet Commonly known as:  COZAAR Take 25 mg by mouth every evening.   metoprolol succinate 100 MG 24 hr tablet Commonly known as:  TOPROL-XL Take 100 mg by mouth daily. Take with or immediately following a meal.   PRESERVISION AREDS 2 Caps Take 1 capsule by mouth 2  (two) times daily.   Rivaroxaban 15 MG Tabs tablet Commonly known as:  XARELTO Take 1 tablet (15 mg total) by mouth daily with supper.   rosuvastatin 10 MG tablet Commonly known as:  CRESTOR Take 10 mg by mouth at bedtime.   tamsulosin 0.4 MG Caps capsule Commonly known as:  FLOMAX Take 0.4 mg by mouth at bedtime.   triamterene-hydrochlorothiazide 37.5-25 MG tablet Commonly known as:  MAXZIDE-25 Take 1 tablet by mouth daily.   Vitamin D-3 1000 units Caps Take 1,000 Units by mouth daily.      Follow-up Information    Deboraha Sprang, MD Follow up on 02/14/2017.   Specialty:  Cardiology Why:  3:30 PM Contact information: [address] [place] [address] [phone]           Signed: [name] 02/13/2017, 1:34 PM  [name], MD [facility] [place] Pager: [phone] Office: [phone] If no answer Cell [phone]

## 2017-02-13 NOTE — Progress Notes (Signed)
Discharge instructions (including medications) discussed with and copy provided to patient/caregiver 

## 2017-02-14 ENCOUNTER — Encounter: Payer: Self-pay | Admitting: Internal Medicine

## 2017-02-14 ENCOUNTER — Ambulatory Visit (INDEPENDENT_AMBULATORY_CARE_PROVIDER_SITE_OTHER): Payer: Medicare Other | Admitting: Internal Medicine

## 2017-02-14 VITALS — Ht 71.0 in | Wt 203.6 lb

## 2017-02-14 DIAGNOSIS — I4892 Unspecified atrial flutter: Secondary | ICD-10-CM | POA: Diagnosis not present

## 2017-02-14 NOTE — Progress Notes (Signed)
He called me about     ELECTROPHYSIOLOGY CONSULT NOTE  Patient ID: [name], MRN: [id number], DOB/AGE: 01-Jun-1935 81 y.o. Admit date: (Not on file) Date of Consult: 02/14/2017  Primary Physician: Dineen Kid, MD Primary Cardiologist: MP     [name] is a 81 y.o. male who is being seen today for the evaluation of Atrial flutter  at the request of MP.    HPI [name] is a 81 y.o. male  Who is been found to have recurrent atrial flutter.  This is been described in his prior history.  It had been previously undocumented.   6/18 when he presented with shoulder and chest pain.  Troponins were negative but he underwent catheterization which demonstrated LAD lesion that was positive by FFR for which he underwent stenting.    7/18 echocardiogram LV function was normal left atrial size was 4.5  11/18 he developed similar discomfort.  He went to the hospital was found to be in atrial flutter.  He was started on Xarelto.  He was continued on his Plavix.  He underwent TEE guided cardioversion. 12/18 he presented to the hospital again with atrial flutter and rates in the 90s.  Diltiazem was added and the heart rate slowed into the 70s and he felt considerably better.  Today he is feeling quite good.  He has had some episodes of weakness and backache which  he ascribes to the hospital beds.  He has chronic peripheral edema secondary to varicose veins  Thromboembolic risk factors ( age -69, HTN-1, Vascular disease ) for a CHADSVASc Score of 4        Past Medical History:  Diagnosis Date  . Arthritis    "knees; lower back" (08/09/2016)  . Atrial fibrillation ([place])    remote hx/notes 08/07/2016  . Episodes of trembling    "most of the time it's my left hand" (08/09/2016)  . High cholesterol   . History of kidney stones   . Hypertension   . Melanoma of shoulder ([place]) 2000s   "left"  . Mitral regurgitation and mitral stenosis    hx/notes  08/07/2016  . Moderate aortic stenosis    hx/notes 08/07/2016  . Prostate cancer ([place]) 2011   hx/notes 08/07/2016      Surgical History:  Past Surgical History:  Procedure Laterality Date  . CARDIAC CATHETERIZATION  ~ [address], [place]"  . CARDIOVERSION N/A 01/26/2017   Procedure: CARDIOVERSION;  Surgeon: [name], MD;  Location: [facility];  Service: Cardiovascular;  Laterality: N/A;  . CATARACT EXTRACTION W/ INTRAOCULAR LENS  IMPLANT, BILATERAL Bilateral 2014-02/2016   left-right; hx/notes 08/07/2016  . CORONARY ANGIOPLASTY WITH STENT PLACEMENT  08/09/2016  . CORONARY ATHERECTOMY N/A 08/09/2016   Procedure: Coronary Atherectomy;  Surgeon: Adrian Prows, MD;  Location: [facility];  Service: Cardiovascular;  Laterality: N/A;  . CORONARY STENT INTERVENTION N/A 08/09/2016   Procedure: Coronary Stent Intervention;  Surgeon: Adrian Prows, MD;  Location: [facility];  Service: Cardiovascular;  Laterality: N/A;  LAD  . EYE SURGERY Bilateral 05/2016   "laser on both lenses"  . INGUINAL HERNIA REPAIR Left 1991  . INSERTION PROSTATE RADIATION SEED  06/25/2009  . INTRAVASCULAR PRESSURE WIRE/FFR STUDY N/A 08/09/2016   Procedure: Intravascular Pressure Wire/FFR Study;  Surgeon: Adrian Prows, MD;  Location: [facility];  Service: Cardiovascular;  Laterality: N/A;  . LAPAROSCOPIC CHOLECYSTECTOMY  1993  . MELANOMA EXCISION Left 1990s   "shoulder"  . RIGHT/LEFT  HEART CATH AND CORONARY ANGIOGRAPHY N/A 08/09/2016   Procedure: Right/Left Heart Cath and Coronary Angiography;  Surgeon: Adrian Prows, MD;  Location: [facility];  Service: Cardiovascular;  Laterality: N/A;  . TEE WITHOUT CARDIOVERSION N/A 01/25/2017   Procedure: TRANSESOPHAGEAL ECHOCARDIOGRAM (TEE);  Surgeon: [name], MD;  Location[facility];  Service: Cardiovascular;  Laterality: N/A;     Home Meds: Prior to Admission medications   Medication Sig Start Date End Date Taking? Authorizing  Provider  carbidopa-levodopa (SINEMET IR) 25-100 MG tablet TAKE 1 TABLET THREE TIMES DAILY Patient taking differently: Take 1 tablet by mouth three times a day (7 AM; 11 AM; 4 PM) 11/14/16   Tat, Eustace Quail, DO  Cholecalciferol (VITAMIN D-3) 1000 units CAPS Take 1,000 Units by mouth daily.    [provider]  clopidogrel (PLAVIX) 75 MG tablet Take 75 mg by mouth daily.    [provider]  diltiazem (CARDIZEM CD) 120 MG 24 hr capsule Take 1 capsule (120 mg total) by mouth daily. 02/12/17   Patwardhan, Reynold Bowen, MD  losartan (COZAAR) 50 MG tablet Take 25 mg by mouth every evening.     [provider]  metoprolol succinate (TOPROL-XL) 100 MG 24 hr tablet Take 100 mg by mouth daily. Take with or immediately following a meal.    [provider]  Multiple Vitamins-Minerals (PRESERVISION AREDS 2) CAPS Take 1 capsule by mouth 2 (two) times daily.    [provider]  Rivaroxaban (XARELTO) 15 MG TABS tablet Take 1 tablet (15 mg total) by mouth daily with supper. 01/27/17   Patwardhan, Reynold Bowen, MD  rosuvastatin (CRESTOR) 10 MG tablet Take 10 mg by mouth at bedtime.    [provider]  tamsulosin (FLOMAX) 0.4 MG CAPS capsule Take 0.4 mg by mouth at bedtime.    [provider]  triamterene-hydrochlorothiazide (MAXZIDE-25) 37.5-25 MG tablet Take 1 tablet by mouth daily.    [provider]    Allergies:  Allergies  Allergen Reactions  . Nsaids Other (See Comments)    Cannot have any of these because he is currently taking Xarelto    Social History   Socioeconomic History  . Marital status: Divorced    Spouse name: Not on file  . Number of children: Not on file  . Years of education: Not on file  . Highest education level: Not on file  Social Needs  . Financial resource strain: Not on file  . Food insecurity - worry: Not on file  . Food insecurity - inability: Not on file  . Transportation needs - medical: Not on file  .  Transportation needs - non-medical: Not on file  Occupational History  . 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 Smoking status: Former Smoker    Years: 0.20    Last attempt to quit: 1958    Years since quitting: 61.0  . Smokeless tobacco: Never Used  . Tobacco comment: "smoked  2 months when I was 20; non since" (08/09/2016)  Substance and Sexual Activity  . Alcohol use: Yes    Alcohol/week: 8.4 oz    Types: 14 Shots of liquor per week    Comment: 2 drinks per day (canadian mist)  . Drug use: No  . Sexual activity: Not Currently  Other Topics Concern  . Not on file  Social History Narrative  . Not on file     Family History  Problem Relation Age  of Onset  . Heart attack Father   . Cerebral aneurysm Sister      ROS:  Please see the history of present illness.     All other systems reviewed and negative.    Physical Exam:  Height 5\' 11"  (1.803 m), weight 203 lb 9.6 oz (92.4 kg), SpO2 100 %. General: Well developed, well nourished male in no acute distress. Head: Normocephalic, atraumatic, sclera non-icteric, no xanthomas, nares are without discharge. EENT: normal  Lymph Nodes:  none Neck: Negative for carotid bruits. JVD not elevated. Back:without scoliosis kyphosis Lungs: Clear bilaterally to auscultation without wheezes, rales, or rhonchi. Breathing is unlabored. Heart: RRR with S1 S2. 2 /6 systolic murmur . No rubs, or gallops appreciated. Abdomen: Soft, non-tender, non-distended with normoactive bowel sounds. No hepatomegaly. No rebound/guarding. No obvious abdominal masses. Msk:  Strength and tone appear normal for age. Extremities: No clubbing or cyanosis. 2+  edema.  Distal pedal pulses are 2+ and equal bilaterally. Skin: Warm and Dry Neuro: Alert and oriented X 3. CN III-XII intact Grossly normal sensory and motor function . Psych:  Responds to questions appropriately with a normal affect.      Labs: Cardiac Enzymes No  results for input(s): CKTOTAL, CKMB, TROPONINI in the last 72 hours. CBC Lab Results  Component Value Date   WBC 6.0 02/11/2017   HGB 13.2 02/11/2017   HCT 40.3 02/11/2017   MCV 92.2 02/11/2017   PLT 159 02/11/2017   PROTIME: No results for input(s): LABPROT, INR in the last 72 hours. Chemistry  Recent Labs  Lab 02/12/17 1715  NA 135  K 4.1  CL 104  CO2 25  BUN 21*  CREATININE 1.59*  CALCIUM 8.7*  GLUCOSE 92   Lipids Lab Results  Component Value Date   CHOL 109 01/25/2017   HDL 52 01/25/2017   LDLCALC 45 01/25/2017   TRIG 62 01/25/2017   BNP No results found for: PROBNP Thyroid Function Tests: No results for input(s): TSH, T4TOTAL, T3FREE, THYROIDAB in the last 72 hours.  Invalid input(s): FREET3 Miscellaneous No results found for: DDIMER  Radiology/Studies:  Dg Chest Portable 1 View  Result Date: 02/11/2017 CLINICAL DATA:  Bilateral chest pain EXAM: PORTABLE CHEST 1 VIEW COMPARISON:  01/24/2017 FINDINGS: Cardiac shadow is stable. Aortic calcifications are again seen. Elevation right hemidiaphragm is again noted. No focal infiltrate or sizable effusion is seen. No bony abnormality is noted. IMPRESSION: No active disease. Electronically Signed   By: Inez Catalina M.D.   On: 02/11/2017 13:18   Dg Chest Port 1 View  Result Date: 01/24/2017 CLINICAL DATA:  Pain radiating between the shoulder blades and in to the left arm EXAM: PORTABLE CHEST 1 VIEW COMPARISON:  Chest x-ray of 05/25/2009 FINDINGS: Chronic elevation of the right hemidiaphragm is stable with mild right basilar volume loss. The left lung is clear. Mediastinal and hilar contours are unremarkable. Heart size is stable. No bony abnormality is seen. IMPRESSION: Stable chronic elevation of the right hemidiaphragm. No definite active process. Electronically Signed   By: Ivar Drape M.D.   On: 01/24/2017 12:55    EKG:  27 November atrial flutter with flutter waves that are upright in lead II and isoelectric in  lead V1 with a rate of about 200 ms  28 November atrial flutter cycle length 240 ms with upright flutter waves in lead II and lead V1  15 December atrial flutter with upright flutter waves in lead V1 and isoelectric in lead II and a cycle  length of 180 ms  18 December atrial flutter with upright flutter waves in lead V1 and nearly isoelectric in lead II a cycle length of 200 ms  Assessment and Plan: * Atrial flutter (S) with different morphologies with similar cycle length suggesting variable rotation around the common isthmus  Coronary artery disease with prior stenting  HFpEF  Hypertension    The patient has atrial flutter with variable morphologies.  None of them looks typical.  However, common things being common if they may well be clockwise and counterclockwise rotation through the cava tricuspid isthmus; alternatively they could have a different circuit altogether.  As he is feeling quite good at this point with the recent introduction of Cardizem, and I am a little bit surprised that his symptoms are so much better given that his heart rate and flutter waves are the same.  Still, symptoms drive most of the therapy for atrial flutter apart from the hopes that a circuit can be obliterated and the need for anticoagulation eliminated.  Given his age and the multiple flutter circuits, I think that that is not likely the case.  Hence, having reviewed the options of 8 continuing current medical therapy, B) antiarrhythmic drug therapy which in his case would probably be dofetilide, and see, EP testing to look for applicable circuit, they have elected with the former.  I would concur.  He will review his symptoms with Dr MP in two weeks at his scheduled appt  He is modestly volume overloaded although resolved--will defer diuretic adjustment to Dr Wonda Amis        Virl Axe

## 2017-02-14 NOTE — Patient Instructions (Signed)
Medication Instructions: Your physician recommends that you continue on your current medications as directed. Please refer to the Current Medication list given to you today.  Labwork: None Ordered  Procedures/Testing: None Ordered  Follow-Up: Your physician recommends that you schedule a follow-up appointment as needed with Dr. Caryl Comes.   If you need a refill on your cardiac medications before your next appointment, please call your pharmacy.

## 2017-02-15 ENCOUNTER — Other Ambulatory Visit: Payer: Self-pay | Admitting: Neurology

## 2017-03-09 DIAGNOSIS — K5900 Constipation, unspecified: Secondary | ICD-10-CM | POA: Diagnosis not present

## 2017-03-09 DIAGNOSIS — I2510 Atherosclerotic heart disease of native coronary artery without angina pectoris: Secondary | ICD-10-CM | POA: Diagnosis not present

## 2017-03-09 DIAGNOSIS — I10 Essential (primary) hypertension: Secondary | ICD-10-CM | POA: Diagnosis not present

## 2017-03-09 DIAGNOSIS — I4892 Unspecified atrial flutter: Secondary | ICD-10-CM | POA: Diagnosis not present

## 2017-03-10 DIAGNOSIS — I4892 Unspecified atrial flutter: Secondary | ICD-10-CM | POA: Diagnosis not present

## 2017-03-10 DIAGNOSIS — I10 Essential (primary) hypertension: Secondary | ICD-10-CM | POA: Diagnosis not present

## 2017-03-10 DIAGNOSIS — I2510 Atherosclerotic heart disease of native coronary artery without angina pectoris: Secondary | ICD-10-CM | POA: Diagnosis not present

## 2017-03-10 DIAGNOSIS — N183 Chronic kidney disease, stage 3 (moderate): Secondary | ICD-10-CM | POA: Diagnosis not present

## 2017-03-24 DIAGNOSIS — L570 Actinic keratosis: Secondary | ICD-10-CM | POA: Diagnosis not present

## 2017-03-24 DIAGNOSIS — M7981 Nontraumatic hematoma of soft tissue: Secondary | ICD-10-CM | POA: Diagnosis not present

## 2017-03-24 DIAGNOSIS — L812 Freckles: Secondary | ICD-10-CM | POA: Diagnosis not present

## 2017-03-24 DIAGNOSIS — L821 Other seborrheic keratosis: Secondary | ICD-10-CM | POA: Diagnosis not present

## 2017-03-24 DIAGNOSIS — D692 Other nonthrombocytopenic purpura: Secondary | ICD-10-CM | POA: Diagnosis not present

## 2017-03-24 DIAGNOSIS — D1801 Hemangioma of skin and subcutaneous tissue: Secondary | ICD-10-CM | POA: Diagnosis not present

## 2017-03-24 DIAGNOSIS — Z85828 Personal history of other malignant neoplasm of skin: Secondary | ICD-10-CM | POA: Diagnosis not present

## 2017-03-24 DIAGNOSIS — Z85820 Personal history of malignant melanoma of skin: Secondary | ICD-10-CM | POA: Diagnosis not present

## 2017-04-18 NOTE — Progress Notes (Signed)
[name] was seen today in the movement disorders clinic for neurologic consultation at the request of Via, Lennette Bihari, MD.  This patient is accompanied in the office by his partner who supplements the history.  The consultation is for the evaluation of gait abnormality and to r/o PD.  The records that were made available to me were reviewed.  12/16/16 update:  Patient is seen today in follow-up for newly diagnosed Parkinson's disease.  This patient is accompanied in the office by his spouse who supplements the history.  Patient was started on levodopa last visit.  He states that he is doing better.  His tremor is better.  Lip tremor has resolved.  No SE.  He is taking carbidopa/levodopa 25/100 at 7am/11am/4pm.    No falls.    I did refer him to Parkinson's rehabilitation, but he opted to hold on that until he finishes cardiac rehabilitation.   Sees Dr. Nadyne Coombes.  04/27/17 update: Patient is seen today in follow-up.  He remains on carbidopa/levodopa 25/100, 1 tablet 3 times per day.  He is doing well on this, without side effects.  No hallucinations.  He has been admitted to the hospital twice since last visit.  The first was at the end of November, 2018 due to chest pain, which was felt likely supply demand mismatch due to atrial flutter.  He returned to the emergency room in December, 2018 and was admitted for persistent atrial flutter with variable tachycardia.  Diltiazem was added in addition to his metoprolol.  He is now having dizziness since the medication was started.  They have a f/u with cardiology after our visit today (Dr. Virgina Jock).  No falls.  No hallucinations.  He gets up in the AM and does 30 min on the bike and then does strength and cardio for 30 min.    PREVIOUS MEDICATIONS: none to date  ALLERGIES:   Allergies  Allergen Reactions  . Nsaids Other (See Comments)    Cannot have any of these because he is currently taking Xarelto    CURRENT MEDICATIONS:  Outpatient  Encounter Medications as of 04/27/2017  Medication Sig  . carbidopa-levodopa (SINEMET IR) 25-100 MG tablet TAKE 1 TABLET THREE TIMES DAILY  . Cholecalciferol (VITAMIN D-3) 1000 units CAPS Take 1,000 Units by mouth daily.  . clopidogrel (PLAVIX) 75 MG tablet Take 75 mg by mouth daily.  [name] diltiazem (CARDIZEM CD) 120 MG 24 hr capsule Take 1 capsule (120 mg total) by mouth daily.  [name] losartan (COZAAR) 50 MG tablet Take 25 mg by mouth every evening.   . metoprolol succinate (TOPROL-XL) 100 MG 24 hr tablet Take 100 mg by mouth daily. Take with or immediately following a meal.  . Multiple Vitamins-Minerals (PRESERVISION AREDS 2) CAPS Take 1 capsule by mouth 2 (two) times daily.  . Rivaroxaban (XARELTO) 15 MG TABS tablet Take 1 tablet (15 mg total) by mouth daily with supper.  . rosuvastatin (CRESTOR) 10 MG tablet Take 10 mg by mouth at bedtime.  . tamsulosin (FLOMAX) 0.4 MG CAPS capsule Take 0.4 mg by mouth at bedtime.  . triamterene-hydrochlorothiazide (MAXZIDE-25) 37.5-25 MG tablet Take 1 tablet by mouth daily.   No facility-administered encounter medications on file as of 04/27/2017.     PAST MEDICAL HISTORY:   Past Medical History:  Diagnosis Date  . Arthritis    "knees; lower back" (08/09/2016)  . Atrial fibrillation ([place])    remote hx/notes 08/07/2016  . Episodes of trembling    "most of  the time it's my left hand" (08/09/2016)  . High cholesterol   . History of kidney stones   . Hypertension   . Melanoma of shoulder ([place]) 2000s   "left"  . Mitral regurgitation and mitral stenosis    hx/notes 08/07/2016  . Moderate aortic stenosis    hx/notes 08/07/2016  . Prostate cancer ([place]) 2011   hx/notes 08/07/2016    PAST SURGICAL HISTORY:   Past Surgical History:  Procedure Laterality Date  . CARDIAC CATHETERIZATION  ~ [address] [place]"  . CARDIOVERSION N/A 01/26/2017   Procedure: CARDIOVERSION;  Surgeon: [name], MD;  Location: [facility];  Service: Cardiovascular;   Laterality: N/A;  . CATARACT EXTRACTION W/ INTRAOCULAR LENS  IMPLANT, BILATERAL Bilateral 2014-02/2016   left-right; hx/notes 08/07/2016  . CORONARY ANGIOPLASTY WITH STENT PLACEMENT  08/09/2016  . CORONARY ATHERECTOMY N/A 08/09/2016   Procedure: Coronary Atherectomy;  Surgeon: Adrian Prows, MD;  Location: [facility];  Service: Cardiovascular;  Laterality: N/A;  . CORONARY STENT INTERVENTION N/A 08/09/2016   Procedure: Coronary Stent Intervention;  Surgeon: Adrian Prows, MD;  Location: [facility];  Service: Cardiovascular;  Laterality: N/A;  LAD  . EYE SURGERY Bilateral 05/2016   "laser on both lenses"  . INGUINAL HERNIA REPAIR Left 1991  . INSERTION PROSTATE RADIATION SEED  06/25/2009  . INTRAVASCULAR PRESSURE WIRE/FFR STUDY N/A 08/09/2016   Procedure: Intravascular Pressure Wire/FFR Study;  Surgeon: Adrian Prows, MD;  Location: [facility];  Service: Cardiovascular;  Laterality: N/A;  . LAPAROSCOPIC CHOLECYSTECTOMY  1993  . MELANOMA EXCISION Left 1990s   "shoulder"  . RIGHT/LEFT HEART CATH AND CORONARY ANGIOGRAPHY N/A 08/09/2016   Procedure: Right/Left Heart Cath and Coronary Angiography;  Surgeon: Adrian Prows, MD;  Location: [facility];  Service: Cardiovascular;  Laterality: N/A;  . TEE WITHOUT CARDIOVERSION N/A 01/25/2017   Procedure: TRANSESOPHAGEAL ECHOCARDIOGRAM (TEE);  Surgeon: [name], MD;  Location: [facility] ENDOSCOPY;  Service: Cardiovascular;  Laterality: N/A;    SOCIAL HISTORY:   Social History   Socioeconomic History  . Marital status: Divorced    Spouse name: Not on file  . Number of children: Not on file  . Years of education: Not on file  . Highest education level: Not on file  Social Needs  . Financial resource strain: Not on file  . Food insecurity - worry: Not on file  . Food insecurity - inability: Not on file  . Transportation needs - medical: Not on file  . Transportation needs - non-medical: Not on file  Occupational History  .  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 Smoking status: Former Smoker    Years: 0.20    Last attempt to quit: 1958    Years since quitting: 61.2  . Smokeless tobacco: Never Used  . Tobacco comment: "smoked  2 months when I was 20; non since" (08/09/2016)  Substance and Sexual Activity  . Alcohol use: Yes    Alcohol/week: 8.4 oz    Types: 14 Shots of liquor per week    Comment: 2 drinks per day (canadian mist)  . Drug use: No  . Sexual activity: Not Currently  Other Topics Concern  . Not on file  Social History Narrative  . Not on file    FAMILY HISTORY:   Family Status  Relation Name Status  . Mother  Deceased  . Father  Deceased  . Sister  Deceased  .  Brother  Alive  . Daughter  Alive    ROS:  A complete 10 system review of systems was obtained and was unremarkable apart from what is mentioned above.  PHYSICAL EXAMINATION:    VITALS:   Vitals:   04/27/17 0953  BP: 120/78  Pulse: 72  SpO2: 98%  Weight: 211 lb (95.7 kg)  Height: 5\' 11"  (1.803 m)    GEN:  The patient appears stated age and is in NAD. HEENT:  Normocephalic, atraumatic.  The mucous membranes are moist. The superficial temporal arteries are without ropiness or tenderness. CV:  RRR Lungs:  CTAB.   Neck/HEME:  There are no carotid bruits bilaterally.  Neurological examination:  Orientation: The patient is alert and oriented x3.  Cranial nerves: There is good facial symmetry. Extraocular muscles are intact with the exception of R esotropia (hx of lazy eye as a kid).  no up or downgaze paresis.   The visual fields are full to confrontational testing. The speech is fluent and clear. Soft palate rises symmetrically and there is no tongue deviation. Hearing is intact to conversational tone. Sensation: Sensation is intact to light touch x 4 Motor: Strength is at least antigravity x 4  Movement examination: Tone: There is mild increased tone bilaterally Abnormal  movements: There is no tremor today Coordination:  There is decremation with  alternation of supination/pronation of the forearm on the L and mild with foot taps bilaterally Gait and Station: The patient has no trouble getting out of chair. He walks well.  He has good arm swing  Labs:  Patient had lab work done on 08/04/2016.  Sodium was 138, potassium 4.8, chloride 94, CO2 26, BUN 26, creatinine 1.39, glucose 101.  White blood cells were 3.9, hemoglobin 14.1, hematocrit 43.4 and platelets 204.  Lab Results  Component Value Date   TSH 2.94 09/15/2016     ASSESSMENT/PLAN:  1.  Idiopathic Parkinson's disease.  The patient has tremor, bradykinesia, rigidity and postural instability.  -We discussed the diagnosis as well as pathophysiology of the disease.  We discussed treatment options as well as prognostic indicators.  Patient education was provided.  -We discussed that it used to be thought that levodopa would increase risk of melanoma but now it is believed that Parkinsons itself likely increases risk of melanoma. he is to get regular skin checks.  He saw Dr. Ronnald Ramp a month ago and had some places frozen and talked to him about PD  -He will continue carbidopa/levodopa 25/100, one tablet 3 times a day.  He is slightly underdosed but don't want to change med right now since dizzy.  2. dizziness  -suspect related to addition of cardizem.  Taking it qid.  Has appt today with cardiology  3.  Follow up is anticipated in the next few months, sooner should new neurologic issues arise.  Much greater than 50% of this visit was spent in counseling and coordinating care.  Total face to face time:  25 min     Cc:  Via, Lennette Bihari, MD

## 2017-04-21 ENCOUNTER — Ambulatory Visit: Payer: Medicare Other | Admitting: Neurology

## 2017-04-27 ENCOUNTER — Ambulatory Visit (INDEPENDENT_AMBULATORY_CARE_PROVIDER_SITE_OTHER): Payer: Medicare Other | Admitting: Neurology

## 2017-04-27 ENCOUNTER — Encounter: Payer: Self-pay | Admitting: Neurology

## 2017-04-27 VITALS — BP 120/78 | HR 72 | Ht 71.0 in | Wt 211.0 lb

## 2017-04-27 DIAGNOSIS — I4892 Unspecified atrial flutter: Secondary | ICD-10-CM | POA: Diagnosis not present

## 2017-04-27 DIAGNOSIS — I872 Venous insufficiency (chronic) (peripheral): Secondary | ICD-10-CM | POA: Diagnosis not present

## 2017-04-27 DIAGNOSIS — R42 Dizziness and giddiness: Secondary | ICD-10-CM | POA: Diagnosis not present

## 2017-04-27 DIAGNOSIS — G20 Parkinson's disease: Secondary | ICD-10-CM | POA: Diagnosis not present

## 2017-04-27 DIAGNOSIS — I481 Persistent atrial fibrillation: Secondary | ICD-10-CM | POA: Diagnosis not present

## 2017-05-09 ENCOUNTER — Other Ambulatory Visit: Payer: Self-pay | Admitting: Family Medicine

## 2017-05-09 ENCOUNTER — Ambulatory Visit
Admission: RE | Admit: 2017-05-09 | Discharge: 2017-05-09 | Disposition: A | Discharge status: Home / Self Care | Payer: Medicare Other | Source: Ambulatory Visit | Attending: Family Medicine | Admitting: Family Medicine

## 2017-05-09 DIAGNOSIS — K5730 Diverticulosis of large intestine without perforation or abscess without bleeding: Secondary | ICD-10-CM | POA: Diagnosis not present

## 2017-05-09 DIAGNOSIS — K439 Ventral hernia without obstruction or gangrene: Secondary | ICD-10-CM

## 2017-05-09 DIAGNOSIS — N183 Chronic kidney disease, stage 3 (moderate): Secondary | ICD-10-CM | POA: Diagnosis not present

## 2017-05-26 DIAGNOSIS — R42 Dizziness and giddiness: Secondary | ICD-10-CM | POA: Diagnosis not present

## 2017-06-02 DIAGNOSIS — I4892 Unspecified atrial flutter: Secondary | ICD-10-CM | POA: Diagnosis not present

## 2017-06-02 DIAGNOSIS — I10 Essential (primary) hypertension: Secondary | ICD-10-CM | POA: Diagnosis not present

## 2017-06-02 DIAGNOSIS — I481 Persistent atrial fibrillation: Secondary | ICD-10-CM | POA: Diagnosis not present

## 2017-06-02 DIAGNOSIS — I872 Venous insufficiency (chronic) (peripheral): Secondary | ICD-10-CM | POA: Diagnosis not present

## 2017-07-05 DIAGNOSIS — I10 Essential (primary) hypertension: Secondary | ICD-10-CM | POA: Diagnosis not present

## 2017-07-05 DIAGNOSIS — I481 Persistent atrial fibrillation: Secondary | ICD-10-CM | POA: Diagnosis not present

## 2017-07-05 DIAGNOSIS — I4892 Unspecified atrial flutter: Secondary | ICD-10-CM | POA: Diagnosis not present

## 2017-07-05 DIAGNOSIS — I872 Venous insufficiency (chronic) (peripheral): Secondary | ICD-10-CM | POA: Diagnosis not present

## 2017-07-11 DIAGNOSIS — G20 Parkinson's disease: Secondary | ICD-10-CM | POA: Diagnosis not present

## 2017-07-11 DIAGNOSIS — E782 Mixed hyperlipidemia: Secondary | ICD-10-CM | POA: Diagnosis not present

## 2017-07-11 DIAGNOSIS — Z8546 Personal history of malignant neoplasm of prostate: Secondary | ICD-10-CM | POA: Diagnosis not present

## 2017-07-11 DIAGNOSIS — I8393 Asymptomatic varicose veins of bilateral lower extremities: Secondary | ICD-10-CM | POA: Diagnosis not present

## 2017-07-11 DIAGNOSIS — N183 Chronic kidney disease, stage 3 (moderate): Secondary | ICD-10-CM | POA: Diagnosis not present

## 2017-07-11 DIAGNOSIS — I2510 Atherosclerotic heart disease of native coronary artery without angina pectoris: Secondary | ICD-10-CM | POA: Diagnosis not present

## 2017-07-11 DIAGNOSIS — I10 Essential (primary) hypertension: Secondary | ICD-10-CM | POA: Diagnosis not present

## 2017-07-11 DIAGNOSIS — I481 Persistent atrial fibrillation: Secondary | ICD-10-CM | POA: Diagnosis not present

## 2017-07-27 DIAGNOSIS — N183 Chronic kidney disease, stage 3 (moderate): Secondary | ICD-10-CM | POA: Diagnosis not present

## 2017-07-27 DIAGNOSIS — Z87442 Personal history of urinary calculi: Secondary | ICD-10-CM | POA: Diagnosis not present

## 2017-07-27 DIAGNOSIS — G20 Parkinson's disease: Secondary | ICD-10-CM | POA: Diagnosis not present

## 2017-07-27 DIAGNOSIS — R310 Gross hematuria: Secondary | ICD-10-CM | POA: Diagnosis not present

## 2017-07-27 DIAGNOSIS — C61 Malignant neoplasm of prostate: Secondary | ICD-10-CM | POA: Diagnosis not present

## 2017-08-06 ENCOUNTER — Other Ambulatory Visit: Payer: Self-pay | Admitting: Neurology

## 2017-08-24 DIAGNOSIS — R310 Gross hematuria: Secondary | ICD-10-CM | POA: Diagnosis not present

## 2017-08-28 DIAGNOSIS — K922 Gastrointestinal hemorrhage, unspecified: Secondary | ICD-10-CM

## 2017-08-28 DIAGNOSIS — C61 Malignant neoplasm of prostate: Secondary | ICD-10-CM | POA: Diagnosis not present

## 2017-08-28 DIAGNOSIS — R310 Gross hematuria: Secondary | ICD-10-CM | POA: Diagnosis not present

## 2017-08-28 HISTORY — DX: Gastrointestinal hemorrhage, unspecified: K92.2

## 2017-08-28 NOTE — Progress Notes (Signed)
[name] was seen today in the movement disorders clinic for neurologic consultation at the request of Via, Lennette Bihari, MD.  This patient is accompanied in the office by his partner who supplements the history.  The consultation is for the evaluation of gait abnormality and to r/o PD.  The records that were made available to me were reviewed.  12/16/16 update:  Patient is seen today in follow-up for newly diagnosed Parkinson's disease.  This patient is accompanied in the office by his spouse who supplements the history.  Patient was started on levodopa last visit.  He states that he is doing better.  His tremor is better.  Lip tremor has resolved.  No SE.  He is taking carbidopa/levodopa 25/100 at 7am/11am/4pm.    No falls.    I did refer him to Parkinson's rehabilitation, but he opted to hold on that until he finishes cardiac rehabilitation.   Sees Dr. Nadyne Coombes.  04/27/17 update: Patient is seen today in follow-up.  He remains on carbidopa/levodopa 25/100, 1 tablet 3 times per day.  He is doing well on this, without side effects.  No hallucinations.  He has been admitted to the hospital twice since last visit.  The first was at the end of November, 2018 due to chest pain, which was felt likely supply demand mismatch due to atrial flutter.  He returned to the emergency room in December, 2018 and was admitted for persistent atrial flutter with variable tachycardia.  Diltiazem was added in addition to his metoprolol.  He is now having dizziness since the medication was started.  They have a f/u with cardiology after our visit today (Dr. Virgina Jock).  No falls.  No hallucinations.  He gets up in the AM and does 30 min on the bike and then does strength and cardio for 30 min.    08/29/17 update: Patient is seen today in follow-up for Parkinson's disease. This patient is accompanied in the office by his spouse who supplements the history. Patient is on carbidopa/levodopa 25/100, 1 tablet 3 times per day.   No wearing off but he cannot tell if it is working either.  Pt denies falls.  Pt denies lightheadedness, near syncope.  No hallucinations.  Mood has been good. Does 30 min on home exercise bike daily.   Had hematuria last week and saw urology.  Pt states that he was told a "blood vessel erupted on his prostate."  cardizem d/c after our last visit and it helped dizziness.  PREVIOUS MEDICATIONS: none to date  ALLERGIES:   Allergies  Allergen Reactions  . Nsaids Other (See Comments)    Cannot have any of these because he is currently taking Xarelto    CURRENT MEDICATIONS:  Outpatient Encounter Medications as of 08/29/2017  Medication Sig  . amLODipine (NORVASC) 5 MG tablet   . carbidopa-levodopa (SINEMET IR) 25-100 MG tablet TAKE 1 TABLET THREE TIMES DAILY  . Cholecalciferol (VITAMIN D-3) 1000 units CAPS Take 1,000 Units by mouth daily.  . clopidogrel (PLAVIX) 75 MG tablet Take 75 mg by mouth daily.  [name] diltiazem (CARDIZEM CD) 120 MG 24 hr capsule Take 1 capsule (120 mg total) by mouth daily.  . furosemide (LASIX) 20 MG tablet   . losartan (COZAAR) 50 MG tablet Take 25 mg by mouth every evening.   . metoprolol succinate (TOPROL-XL) 100 MG 24 hr tablet Take 100 mg by mouth daily. Take with or immediately following a meal.  . Multiple Vitamins-Minerals (PRESERVISION AREDS 2) CAPS Take  1 capsule by mouth 2 (two) times daily.  . Rivaroxaban (XARELTO) 15 MG TABS tablet Take 1 tablet (15 mg total) by mouth daily with supper.  . rosuvastatin (CRESTOR) 10 MG tablet Take 10 mg by mouth at bedtime.  . tamsulosin (FLOMAX) 0.4 MG CAPS capsule Take 0.4 mg by mouth at bedtime.  . triamterene-hydrochlorothiazide (MAXZIDE-25) 37.5-25 MG tablet Take 1 tablet by mouth daily.   No facility-administered encounter medications on file as of 08/29/2017.     PAST MEDICAL HISTORY:   Past Medical History:  Diagnosis Date  . Arthritis    "knees; lower back" (08/09/2016)  . Atrial fibrillation ([place])    remote  hx/notes 08/07/2016  . Episodes of trembling    "most of the time it's my left hand" (08/09/2016)  . High cholesterol   . History of kidney stones   . Hypertension   . Melanoma of shoulder ([place]) 2000s   "left"  . Mitral regurgitation and mitral stenosis    hx/notes 08/07/2016  . Moderate aortic stenosis    hx/notes 08/07/2016  . Prostate cancer ([place]) 2011   hx/notes 08/07/2016    PAST SURGICAL HISTORY:   Past Surgical History:  Procedure Laterality Date  . CARDIAC CATHETERIZATION  ~ [address] [place]"  . CARDIOVERSION N/A 01/26/2017   Procedure: CARDIOVERSION;  Surgeon: [name], MD;  Location: [facility];  Service: Cardiovascular;  Laterality: N/A;  . CATARACT EXTRACTION W/ INTRAOCULAR LENS  IMPLANT, BILATERAL Bilateral 2014-02/2016   left-right; hx/notes 08/07/2016  . CORONARY ANGIOPLASTY WITH STENT PLACEMENT  08/09/2016  . CORONARY ATHERECTOMY N/A 08/09/2016   Procedure: Coronary Atherectomy;  Surgeon: Adrian Prows, MD;  Location: [facility];  Service: Cardiovascular;  Laterality: N/A;  . CORONARY STENT INTERVENTION N/A 08/09/2016   Procedure: Coronary Stent Intervention;  Surgeon: Adrian Prows, MD;  Location: [facility];  Service: Cardiovascular;  Laterality: N/A;  LAD  . EYE SURGERY Bilateral 05/2016   "laser on both lenses"  . INGUINAL HERNIA REPAIR Left 1991  . INSERTION PROSTATE RADIATION SEED  06/25/2009  . INTRAVASCULAR PRESSURE WIRE/FFR STUDY N/A 08/09/2016   Procedure: Intravascular Pressure Wire/FFR Study;  Surgeon: Adrian Prows, MD;  Location: [facility];  Service: Cardiovascular;  Laterality: N/A;  . LAPAROSCOPIC CHOLECYSTECTOMY  1993  . MELANOMA EXCISION Left 1990s   "shoulder"  . RIGHT/LEFT HEART CATH AND CORONARY ANGIOGRAPHY N/A 08/09/2016   Procedure: Right/Left Heart Cath and Coronary Angiography;  Surgeon: Adrian Prows, MD;  Location: [facility];  Service: Cardiovascular;  Laterality: N/A;  . TEE WITHOUT CARDIOVERSION N/A  01/25/2017   Procedure: TRANSESOPHAGEAL ECHOCARDIOGRAM (TEE);  Surgeon: [name], MD;  Location: [facility];  Service: Cardiovascular;  Laterality: N/A;    SOCIAL HISTORY:   Social History   Socioeconomic History  . Marital status: Divorced    Spouse name: Not on file  . Number of children: Not on file  . Years of education: Not on file  . Highest education level: Not on file  Occupational History  . 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 Financial resource strain: Not on file  . Food insecurity:    Worry: Not on file    Inability: Not on file  . Transportation needs:    Medical: Not on file    Non-medical: Not on file  Tobacco Use  . Smoking status: Former Smoker    Years: 0.20    Last  attempt to quit: 1958    Years since quitting: 61.5  . Smokeless tobacco: Never Used  . Tobacco comment: "smoked  2 months when I was 20; non since" (08/09/2016)  Substance and Sexual Activity  . Alcohol use: Yes    Alcohol/week: 8.4 oz    Types: 14 Shots of liquor per week    Comment: 2 drinks per day (canadian mist)  . Drug use: No  . Sexual activity: Not Currently  Lifestyle  . Physical activity:    Days per week: Not on file    Minutes per session: Not on file  . Stress: Not on file  Relationships  . Social connections:    Talks on phone: Not on file    Gets together: Not on file    Attends religious service: Not on file    Active member of club or organization: Not on file    Attends meetings of clubs or organizations: Not on file    Relationship status: Not on file  . Intimate partner violence:    Fear of current or ex partner: Not on file    Emotionally abused: Not on file    Physically abused: Not on file    Forced sexual activity: Not on file  Other Topics Concern  . Not on file  Social History Narrative  . Not on file    FAMILY HISTORY:   Family Status  Relation Name Status  . Mother  Deceased  . Father   Deceased  . Sister  Deceased  . Brother  Alive  . Daughter  Alive    ROS:  A complete 10 system review of systems was obtained and was unremarkable apart from what is mentioned above.  PHYSICAL EXAMINATION:    VITALS:   Vitals:   08/29/17 1255  BP: 100/60  Pulse: 74  SpO2: 96%  Weight: 212 lb 6 oz (96.3 kg)  Height: 5\' 11"  (1.803 m)    GEN:  The patient appears stated age and is in NAD. GEN:  The patient appears stated age and is in NAD. HEENT:  Normocephalic, atraumatic.  The mucous membranes are moist. The superficial temporal arteries are without ropiness or tenderness. CV:  RRR Lungs:  CTAB Neck/HEME:  There are no carotid bruits bilaterally.  Neurological examination:  Orientation: The patient is alert and oriented x3. Cranial nerves: There is good facial symmetry. There is facial hypomimia.  There is chronic R exotropia.  The speech is fluent and clear. Soft palate rises symmetrically and there is no tongue deviation. Hearing is intact to conversational tone. Sensation: Sensation is intact to light touch throughout Motor: Strength is 5/5 in the bilateral upper and lower extremities.   Shoulder shrug is equal and symmetric.  There is no pronator drift.  Movement examination: Tone: There is mod increased tone in the LUE and mild in the RUE Abnormal movements: There is intermittent LUE resting tremor Coordination:  There is mod decremation with RAM's, with any form of RAMS, including alternating supination and pronation of the forearm, hand opening and closing, finger taps, heel taps and toe taps, UE more than LE and L>R Gait and Station: The patient has no difficulty arising out of a deep-seated chair without the use of the hands. The patient's stride length is decreased and he is dragging the L leg.  There is decreased arm swing on the L   Labs:  Patient had lab work done on 08/04/2016.  Sodium was 138, potassium 4.8, chloride 94, CO2  26, BUN 26, creatinine 1.39,  glucose 101.  White blood cells were 3.9, hemoglobin 14.1, hematocrit 43.4 and platelets 204.  Lab Results  Component Value Date   TSH 2.94 09/15/2016     ASSESSMENT/PLAN:  1.  Idiopathic Parkinson's disease.  The patient has tremor, bradykinesia, rigidity and postural instability.  -We discussed the diagnosis as well as pathophysiology of the disease.  We discussed treatment options as well as prognostic indicators.  Patient education was provided.  -increase carbidopa/levodopa 25/100, 2 at 7am/2 at 11am/1 at 4pm.    -get regular skin examinations.  Next is 09/22/17.  2. dizziness  -improved after cardizem d/c  3.  Follow up is anticipated in the next few months, sooner should new neurologic issues arise.  25 min     Cc:  ViaLennette Bihari, MD

## 2017-08-29 ENCOUNTER — Encounter: Payer: Self-pay | Admitting: Neurology

## 2017-08-29 ENCOUNTER — Ambulatory Visit (INDEPENDENT_AMBULATORY_CARE_PROVIDER_SITE_OTHER): Payer: Medicare Other | Admitting: Neurology

## 2017-08-29 VITALS — BP 100/60 | HR 74 | Ht 71.0 in | Wt 212.4 lb

## 2017-08-29 DIAGNOSIS — G20 Parkinson's disease: Secondary | ICD-10-CM

## 2017-08-29 MED ORDER — CARBIDOPA-LEVODOPA 25-100 MG PO TABS: ORAL_TABLET | ORAL | 1 refills | Status: DC

## 2017-08-29 NOTE — Patient Instructions (Addendum)
Increase carbidopa/levodopa 25/100 to 2 tablets at 7am/2 tablet at 11am/1 tablet at 4pm  Registration is OPEN!    Third Annual Parkinson's Education Symposium   To register: [email or web address]      Search:  [employer] person attending individually Questions: [place], [place] or Janett [email or web address]

## 2017-09-01 DIAGNOSIS — R310 Gross hematuria: Secondary | ICD-10-CM | POA: Diagnosis not present

## 2017-09-06 ENCOUNTER — Telehealth: Payer: Self-pay | Admitting: Neurology

## 2017-09-06 NOTE — Telephone Encounter (Signed)
He probably needs to take BP first and then report back or come in and let us do orthostatics

## 2017-09-06 NOTE — Telephone Encounter (Signed)
Spoke with patient. He states his dizziness had gotten better (as documented on last note) since stopping Cardizem.   Since increasing Carbidopa Levodopa he states dizziness has started again after his first dose and he is having dizzy spells all day long. Hasn't taken his blood pressure. He is wanting to know if this could be the cause or if he should contact cardiology. Please advise.

## 2017-09-06 NOTE — Telephone Encounter (Signed)
Patient called back.  Laying: BP 118/60 P 84 Sitting: BP 128/67 P 71 Standing: BP 125/70 P 86  Please advise.

## 2017-09-06 NOTE — Telephone Encounter (Signed)
Patient made aware. He has kept a log of blood pressures and they stay pretty consistently at this range. He will also make Cardiology aware of dizziness.

## 2017-09-06 NOTE — Telephone Encounter (Signed)
Tamsen Snider Partner [phone]  Vaughan Basta called to say that since Dr Tat had increased [place] he has been experiencing more dizziness. Please call and advise.

## 2017-09-06 NOTE — Telephone Encounter (Signed)
These look good.  IF levodopa is going to cause dizziness it is due to BP drops and this does not look like the issue.  Have him continue a log for the next few weeks and see if stays good.

## 2017-09-06 NOTE — Telephone Encounter (Signed)
Patient had a blood pressure machine. He will take his BP laying, sitting, and standing and call me back with the readings. He did not want to come to the office if he could do this at home.

## 2017-09-15 DIAGNOSIS — K649 Unspecified hemorrhoids: Secondary | ICD-10-CM | POA: Diagnosis not present

## 2017-09-15 DIAGNOSIS — K625 Hemorrhage of anus and rectum: Secondary | ICD-10-CM | POA: Diagnosis not present

## 2017-09-15 DIAGNOSIS — K5900 Constipation, unspecified: Secondary | ICD-10-CM | POA: Diagnosis not present

## 2017-09-21 ENCOUNTER — Encounter (HOSPITAL_COMMUNITY): Payer: Self-pay | Admitting: Emergency Medicine

## 2017-09-21 ENCOUNTER — Inpatient Hospital Stay (HOSPITAL_COMMUNITY)
Admission: EM | Admit: 2017-09-21 | Discharge: 2017-09-29 | DRG: 348 | Disposition: A | Discharge status: Home / Self Care | Payer: Medicare Other | Attending: Internal Medicine | Admitting: Internal Medicine

## 2017-09-21 ENCOUNTER — Other Ambulatory Visit: Payer: Self-pay

## 2017-09-21 DIAGNOSIS — R17 Unspecified jaundice: Secondary | ICD-10-CM | POA: Diagnosis not present

## 2017-09-21 DIAGNOSIS — K5900 Constipation, unspecified: Secondary | ICD-10-CM | POA: Diagnosis present

## 2017-09-21 DIAGNOSIS — Z955 Presence of coronary angioplasty implant and graft: Secondary | ICD-10-CM

## 2017-09-21 DIAGNOSIS — K625 Hemorrhage of anus and rectum: Secondary | ICD-10-CM | POA: Diagnosis not present

## 2017-09-21 DIAGNOSIS — Z85820 Personal history of malignant melanoma of skin: Secondary | ICD-10-CM

## 2017-09-21 DIAGNOSIS — E860 Dehydration: Secondary | ICD-10-CM | POA: Diagnosis present

## 2017-09-21 DIAGNOSIS — I4891 Unspecified atrial fibrillation: Secondary | ICD-10-CM

## 2017-09-21 DIAGNOSIS — K922 Gastrointestinal hemorrhage, unspecified: Secondary | ICD-10-CM | POA: Diagnosis present

## 2017-09-21 DIAGNOSIS — K648 Other hemorrhoids: Secondary | ICD-10-CM

## 2017-09-21 DIAGNOSIS — D696 Thrombocytopenia, unspecified: Secondary | ICD-10-CM | POA: Diagnosis present

## 2017-09-21 DIAGNOSIS — N179 Acute kidney failure, unspecified: Secondary | ICD-10-CM | POA: Diagnosis present

## 2017-09-21 DIAGNOSIS — I2510 Atherosclerotic heart disease of native coronary artery without angina pectoris: Secondary | ICD-10-CM | POA: Diagnosis present

## 2017-09-21 DIAGNOSIS — I4892 Unspecified atrial flutter: Secondary | ICD-10-CM | POA: Diagnosis not present

## 2017-09-21 DIAGNOSIS — Z9861 Coronary angioplasty status: Secondary | ICD-10-CM

## 2017-09-21 DIAGNOSIS — D72819 Decreased white blood cell count, unspecified: Secondary | ICD-10-CM | POA: Diagnosis present

## 2017-09-21 DIAGNOSIS — D62 Acute posthemorrhagic anemia: Secondary | ICD-10-CM | POA: Diagnosis present

## 2017-09-21 DIAGNOSIS — N189 Chronic kidney disease, unspecified: Secondary | ICD-10-CM

## 2017-09-21 DIAGNOSIS — K641 Second degree hemorrhoids: Principal | ICD-10-CM | POA: Diagnosis present

## 2017-09-21 DIAGNOSIS — N183 Chronic kidney disease, stage 3 unspecified: Secondary | ICD-10-CM

## 2017-09-21 DIAGNOSIS — Z8546 Personal history of malignant neoplasm of prostate: Secondary | ICD-10-CM

## 2017-09-21 DIAGNOSIS — Z7901 Long term (current) use of anticoagulants: Secondary | ICD-10-CM

## 2017-09-21 DIAGNOSIS — I129 Hypertensive chronic kidney disease with stage 1 through stage 4 chronic kidney disease, or unspecified chronic kidney disease: Secondary | ICD-10-CM | POA: Diagnosis present

## 2017-09-21 DIAGNOSIS — Z8249 Family history of ischemic heart disease and other diseases of the circulatory system: Secondary | ICD-10-CM

## 2017-09-21 DIAGNOSIS — I481 Persistent atrial fibrillation: Secondary | ICD-10-CM | POA: Diagnosis not present

## 2017-09-21 DIAGNOSIS — I4510 Unspecified right bundle-branch block: Secondary | ICD-10-CM | POA: Diagnosis not present

## 2017-09-21 DIAGNOSIS — Z86010 Personal history of colonic polyps: Secondary | ICD-10-CM

## 2017-09-21 DIAGNOSIS — G20 Parkinson's disease: Secondary | ICD-10-CM | POA: Diagnosis present

## 2017-09-21 HISTORY — DX: Parkinson's disease without dyskinesia, without mention of fluctuations: G20.A1

## 2017-09-21 HISTORY — DX: Gastrointestinal hemorrhage, unspecified: K92.2

## 2017-09-21 HISTORY — DX: Atherosclerotic heart disease of native coronary artery without angina pectoris: I25.10

## 2017-09-21 HISTORY — DX: Parkinson's disease: G20

## 2017-09-21 LAB — COMPREHENSIVE METABOLIC PANEL
ALT: 5 U/L (ref 0–44)
AST: 24 U/L (ref 15–41)
Albumin: 4 g/dL (ref 3.5–5.0)
Alkaline Phosphatase: 101 U/L (ref 38–126)
Anion gap: 10 (ref 5–15)
BUN: 32 mg/dL — ABNORMAL HIGH (ref 8–23)
CO2: 29 mmol/L (ref 22–32)
Calcium: 9.3 mg/dL (ref 8.9–10.3)
Chloride: 99 mmol/L (ref 98–111)
Creatinine, Ser: 2.13 mg/dL — ABNORMAL HIGH (ref 0.61–1.24)
GFR calc Af Amer: 32 mL/min — ABNORMAL LOW (ref >60)
GFR calc non Af Amer: 28 mL/min — ABNORMAL LOW (ref >60)
Glucose, Bld: 97 mg/dL (ref 70–99)
Potassium: 3.8 mmol/L (ref 3.5–5.1)
Sodium: 138 mmol/L (ref 135–145)
Total Bilirubin: 2.3 mg/dL — ABNORMAL HIGH (ref 0.3–1.2)
Total Protein: 6.9 g/dL (ref 6.5–8.1)

## 2017-09-21 LAB — TYPE AND SCREEN
ABO/RH(D): B POS
Antibody Screen: NEGATIVE

## 2017-09-21 LAB — CBC WITH DIFFERENTIAL/PLATELET
Abs Immature Granulocytes: 0 10*3/uL (ref 0.0–0.1)
Basophils Absolute: 0.1 10*3/uL (ref 0.0–0.1)
Basophils Relative: 1 %
Eosinophils Absolute: 0.1 10*3/uL (ref 0.0–0.7)
Eosinophils Relative: 3 %
HCT: 31.5 % — ABNORMAL LOW (ref 39.0–52.0)
Hemoglobin: 9.7 g/dL — ABNORMAL LOW (ref 13.0–17.0)
Immature Granulocytes: 0 %
Lymphocytes Relative: 17 %
Lymphs Abs: 0.8 10*3/uL (ref 0.7–4.0)
MCH: 28.3 pg (ref 26.0–34.0)
MCHC: 30.8 g/dL (ref 30.0–36.0)
MCV: 91.8 fL (ref 78.0–100.0)
Monocytes Absolute: 0.4 10*3/uL (ref 0.1–1.0)
Monocytes Relative: 10 %
Neutro Abs: 3.1 10*3/uL (ref 1.7–7.7)
Neutrophils Relative %: 69 %
Platelets: 151 10*3/uL (ref 150–400)
RBC: 3.43 MIL/uL — ABNORMAL LOW (ref 4.22–5.81)
RDW: 14.1 % (ref 11.5–15.5)
WBC: 4.5 10*3/uL (ref 4.0–10.5)

## 2017-09-21 LAB — PROTIME-INR
INR: 1.87
Prothrombin Time: 21.4 seconds — ABNORMAL HIGH (ref 11.4–15.2)

## 2017-09-21 LAB — ABO/RH: ABO/RH(D): B POS

## 2017-09-21 MED ORDER — CARBIDOPA-LEVODOPA 25-100 MG PO TABS
1.0000 | ORAL_TABLET | Freq: Three times a day (TID) | ORAL | Status: DC
  Administered 2017-09-21 – 2017-09-26 (×14): 1 via ORAL
  Filled 2017-09-21 (×15): qty 1

## 2017-09-21 MED ORDER — TRIAMTERENE-HCTZ 37.5-25 MG PO TABS
1.0000 | ORAL_TABLET | Freq: Every day | ORAL | Status: DC
  Administered 2017-09-22 – 2017-09-29 (×7): 1 via ORAL
  Filled 2017-09-21 (×8): qty 1

## 2017-09-21 MED ORDER — POLYETHYLENE GLYCOL 3350 17 G PO PACK
17.0000 g | PACK | Freq: Every day | ORAL | Status: DC
  Administered 2017-09-22: 17 g via ORAL
  Filled 2017-09-21: qty 1

## 2017-09-21 MED ORDER — ONDANSETRON HCL 4 MG PO TABS: 4.0000 mg | ORAL_TABLET | Freq: Four times a day (QID) | ORAL | Status: DC | PRN

## 2017-09-21 MED ORDER — TRAMADOL HCL 50 MG PO TABS: 50.0000 mg | ORAL_TABLET | Freq: Four times a day (QID) | ORAL | Status: DC | PRN

## 2017-09-21 MED ORDER — LOSARTAN POTASSIUM 50 MG PO TABS
25.0000 mg | ORAL_TABLET | Freq: Every evening | ORAL | Status: DC
  Administered 2017-09-21 – 2017-09-28 (×8): 25 mg via ORAL
  Filled 2017-09-21 (×9): qty 1

## 2017-09-21 MED ORDER — VITAMIN D-3 25 MCG (1000 UT) PO CAPS: 1000.0000 [IU] | ORAL_CAPSULE | Freq: Every day | ORAL | Status: DC

## 2017-09-21 MED ORDER — ACETAMINOPHEN 650 MG RE SUPP: 650.0000 mg | Freq: Four times a day (QID) | RECTAL | Status: DC | PRN

## 2017-09-21 MED ORDER — ROSUVASTATIN CALCIUM 10 MG PO TABS
10.0000 mg | ORAL_TABLET | Freq: Every day | ORAL | Status: DC
  Administered 2017-09-21 – 2017-09-28 (×8): 10 mg via ORAL
  Filled 2017-09-21 (×8): qty 1

## 2017-09-21 MED ORDER — VITAMIN D 1000 UNITS PO TABS
1000.0000 [IU] | ORAL_TABLET | Freq: Every day | ORAL | Status: DC
  Administered 2017-09-22 – 2017-09-29 (×7): 1000 [IU] via ORAL
  Filled 2017-09-21 (×7): qty 1

## 2017-09-21 MED ORDER — ACETAMINOPHEN 325 MG PO TABS
650.0000 mg | ORAL_TABLET | Freq: Four times a day (QID) | ORAL | Status: DC | PRN
  Administered 2017-09-28: 650 mg via ORAL
  Filled 2017-09-21: qty 2

## 2017-09-21 MED ORDER — AMLODIPINE BESYLATE 5 MG PO TABS
5.0000 mg | ORAL_TABLET | Freq: Every day | ORAL | Status: DC
  Administered 2017-09-22 – 2017-09-28 (×6): 5 mg via ORAL
  Filled 2017-09-21 (×8): qty 1

## 2017-09-21 MED ORDER — POTASSIUM CHLORIDE IN NACL 20-0.9 MEQ/L-% IV SOLN
INTRAVENOUS | Status: DC
  Administered 2017-09-21 – 2017-09-23 (×3): via INTRAVENOUS
  Filled 2017-09-21 (×4): qty 1000

## 2017-09-21 MED ORDER — METOPROLOL SUCCINATE ER 100 MG PO TB24
100.0000 mg | ORAL_TABLET | Freq: Every morning | ORAL | Status: DC
  Administered 2017-09-22 – 2017-09-28 (×6): 100 mg via ORAL
  Filled 2017-09-21 (×7): qty 1

## 2017-09-21 MED ORDER — TRAZODONE HCL 50 MG PO TABS: 25.0000 mg | ORAL_TABLET | Freq: Every evening | ORAL | Status: DC | PRN

## 2017-09-21 MED ORDER — ONDANSETRON HCL 4 MG/2ML IJ SOLN: 4.0000 mg | Freq: Four times a day (QID) | INTRAMUSCULAR | Status: DC | PRN

## 2017-09-21 MED ORDER — TAMSULOSIN HCL 0.4 MG PO CAPS
0.4000 mg | ORAL_CAPSULE | Freq: Every day | ORAL | Status: DC
  Administered 2017-09-21 – 2017-09-28 (×8): 0.4 mg via ORAL
  Filled 2017-09-21 (×8): qty 1

## 2017-09-21 MED ORDER — SODIUM CHLORIDE 0.9 % IV BOLUS
500.0000 mL | Freq: Once | INTRAVENOUS | Status: AC
  Administered 2017-09-21: 500 mL via INTRAVENOUS

## 2017-09-21 NOTE — H&P (Signed)
History and Physical    [name] WCB:[id number] DOB: 1935-12-13 DOA: 09/21/2017  PCP: Dineen Kid, MD  Patient coming from: home  I have personally briefly reviewed patient's old medical records in [place]  Chief Complaint:   HPI: [name] is a 82 y.o. male with medical history significant of true fibrillation on Xarelto as well as coronary artery disease status post percutaneous coronary intervention in June 2018 who presents with a complaint of GI bleeding for approximately 2 weeks.  He saw the physician assistant at [facility] recently and was noted to be constipated so started on Colace but he was continuing to have bloody bowel movements so he presented to our facility.  His hemoglobin previously was 11.1 per paperwork from his wife and today he presents with a hemoglobin of 9.  He is mildly symptomatic with some dizziness.  He is continuing to take his Xarelto.  To be a little bit dehydrated with a creatinine of 2.13 and an elevated BUN consistent with bleeding.  Try it is consulted for further evaluation and management after discussion with Dr. Michail Sermon from GI.  Abdominal pain, nausea, vomiting, headache, blurry vision, he does complain of some dizziness but is not orthostatic.   Review of Systems: As per HPI otherwise all other systems reviewed and  negative.    Past Medical History:  Diagnosis Date  . Arthritis    "knees; lower back" (08/09/2016)  . Atrial fibrillation ([place])    remote hx/notes 08/07/2016  . Episodes of trembling    "most of the time it's my left hand" (08/09/2016)  . High cholesterol   . History of kidney stones   . Hypertension   . Melanoma of shoulder ([place]) 2000s   "left"  . Mitral regurgitation and mitral stenosis    hx/notes 08/07/2016  . Moderate aortic stenosis    hx/notes 08/07/2016  . Prostate cancer ([place]) 2011   hx/notes 08/07/2016    Past Surgical History:  Procedure Laterality Date  . CARDIAC CATHETERIZATION   ~ [address], [place]"  . CARDIOVERSION N/A 01/26/2017   Procedure: CARDIOVERSION;  Surgeon: [name], MD;  Location: [facility];  Service: Cardiovascular;  Laterality: N/A;  . CATARACT EXTRACTION W/ INTRAOCULAR LENS  IMPLANT, BILATERAL Bilateral 2014-02/2016   left-right; hx/notes 08/07/2016  . CORONARY ANGIOPLASTY WITH STENT PLACEMENT  08/09/2016  . CORONARY ATHERECTOMY N/A 08/09/2016   Procedure: Coronary Atherectomy;  Surgeon: Adrian Prows, MD;  Location: [facility];  Service: Cardiovascular;  Laterality: N/A;  . CORONARY STENT INTERVENTION N/A 08/09/2016   Procedure: Coronary Stent Intervention;  Surgeon: Adrian Prows, MD;  Location: [facility];  Service: Cardiovascular;  Laterality: N/A;  LAD  . EYE SURGERY Bilateral 05/2016   "laser on both lenses"  . INGUINAL HERNIA REPAIR Left 1991  . INSERTION PROSTATE RADIATION SEED  06/25/2009  . INTRAVASCULAR PRESSURE WIRE/FFR STUDY N/A 08/09/2016   Procedure: Intravascular Pressure Wire/FFR Study;  Surgeon: Adrian Prows, MD;  Location: [facility];  Service: Cardiovascular;  Laterality: N/A;  . LAPAROSCOPIC CHOLECYSTECTOMY  1993  . MELANOMA EXCISION Left 1990s   "shoulder"  . RIGHT/LEFT HEART CATH AND CORONARY ANGIOGRAPHY N/A 08/09/2016   Procedure: Right/Left Heart Cath and Coronary Angiography;  Surgeon: Adrian Prows, MD;  Location: [facility];  Service: Cardiovascular;  Laterality: N/A;  . TEE WITHOUT CARDIOVERSION N/A 01/25/2017   Procedure: TRANSESOPHAGEAL ECHOCARDIOGRAM (TEE);  Surgeon: [name], MD;  Location: [facility];  Service: Cardiovascular;  Laterality: N/A;    Social History   Social History Narrative   Retired Hotel manager with [employer]     reports that he quit smoking about 61 years ago. He quit after 0.20 years of use. He has never used smokeless tobacco. He reports that he drinks about 8.4 oz of alcohol per week. He reports that he does not use drugs.  Allergies  Allergen  Reactions  . Nsaids Other (See Comments)    Cannot have any of these because he is currently taking Xarelto    Family History  Problem Relation Age of Onset  . Heart attack Father   . Cerebral aneurysm Sister      Prior to Admission medications   Medication Sig Start Date End Date Taking? Authorizing Provider  amLODipine (NORVASC) 5 MG tablet Take 5 mg by mouth daily.  06/02/17  Yes [provider]  carbidopa-levodopa (SINEMET IR) 25-100 MG tablet 2 at 7am/2 at 11am/1 at 4pm Patient taking differently: Take 1 tablet by mouth 3 (three) times daily.  08/29/17  Yes Tat, Eustace Quail, DO  Cholecalciferol (VITAMIN D-3) 1000 units CAPS Take 1,000 Units by mouth daily.   Yes [provider]  furosemide (LASIX) 20 MG tablet Take 20 mg by mouth daily.  06/29/17  Yes [provider]  hydrocortisone (ANUSOL-HC) 25 MG suppository Place 25 mg rectally 2 (two) times daily.  09/15/17  Yes [provider]  losartan (COZAAR) 50 MG tablet Take 25 mg by mouth every evening.    Yes [provider]  metoprolol succinate (TOPROL-XL) 100 MG 24 hr tablet Take 100 mg by mouth every morning. Take with or immediately following a meal.    Yes [provider]  Rivaroxaban (XARELTO) 15 MG TABS tablet Take 1 tablet (15 mg total) by mouth daily with supper. 01/27/17  Yes Patwardhan, Manish J, MD  rosuvastatin (CRESTOR) 10 MG tablet Take 10 mg by mouth at bedtime.   Yes [provider]  tamsulosin (FLOMAX) 0.4 MG CAPS capsule Take 0.4 mg by mouth at bedtime.   Yes [provider]  triamterene-hydrochlorothiazide (MAXZIDE-25) 37.5-25 MG tablet Take 1 tablet by mouth daily.   Yes [provider]    Physical Exam:  Constitutional: NAD, calm, comfortable Vitals:   09/21/17 1000 09/21/17 1015 09/21/17 1030 09/21/17 1045  BP: 122/65 117/70 119/61 (!) 123/59  Pulse: 69 72  64  Resp: 15 14 14 15   Temp:      TempSrc:      SpO2: 98% 98%  100%    Weight:      Height:       Eyes: PERRL, lids and conjunctivae pale ENMT: Mucous membranes are moist. Posterior pharynx clear of any exudate or lesions.Normal dentition.  Neck: normal, supple, no masses, no thyromegaly Respiratory: clear to auscultation bilaterally, no wheezing, no crackles. Normal respiratory effort. No accessory muscle use.  Cardiovascular: Regular rate and rhythm, no murmurs / rubs / gallops. No extremity edema. 2+ pedal pulses. No carotid bruits.  Abdomen: no tenderness, no masses palpated. No hepatosplenomegaly. Bowel sounds positive.  Musculoskeletal: no clubbing / cyanosis. No joint deformity upper and lower extremities. Good ROM, no contractures. Normal muscle tone.  Skin: no rashes, lesions, ulcers. No induration, pale Neurologic: CN 2-12 grossly intact. Sensation intact, DTR normal. Strength 5/5 in all 4.  Psychiatric: Normal judgment and insight. Alert and oriented x 3. Normal mood.    Labs on Admission: I have personally reviewed following labs and  imaging studies  CBC: Recent Labs  Lab 09/21/17 0931  WBC 4.5  NEUTROABS 3.1  HGB 9.7*  HCT 31.5*  MCV 91.8  PLT 881   Basic Metabolic Panel: Recent Labs  Lab 09/21/17 0931  NA 138  K 3.8  CL 99  CO2 29  GLUCOSE 97  BUN 32*  CREATININE 2.13*  CALCIUM 9.3   GFR: Estimated Creatinine Clearance: 33.1 mL/min (A) (by C-G formula based on SCr of 2.13 mg/dL (H)). Liver Function Tests: Recent Labs  Lab 09/21/17 0931  AST 24  ALT 5  ALKPHOS 101  BILITOT 2.3*  PROT 6.9  ALBUMIN 4.0   Coagulation Profile: Recent Labs  Lab 09/21/17 0931  INR 1.87    Radiological Exams on Admission: No results found.  EKG: Independently reviewed.  Sinus rhythm with ventricular premature complexes, decreased PR interval, right atrial enlargement, right bundle branch block.  Compared to previous it is unchanged.  Assessment/Plan Principal Problem:   Acute lower GI bleeding Active Problems:   Acute blood  loss anemia   Acute kidney injury superimposed on CKD ([place])   Atrial flutter ([place])   Post PTCA  1.  Acute lower GI bleeding: Patient has had bloodstains on his underwear.  This appears to be lower GI bleeding although recently it has been darker as well.  We are going to stop Xarelto.  Placed the patient on a clear liquid diet.  He has not had a colonoscopy since approximately 2015 or 2016.  He is followed by Dr. Watt Climes and his partner Dr. Michail Sermon will be seeing him here in the hospital.  2.  Acute blood loss anemia: Patient's previous hemoglobin was 11.1 it is now down to 9.  At this point he does not require blood transfusion but please note that in December 2018 his hemoglobin was 13.2.  I expect that as he gets hydrated which I intend to do for his dizziness that the patient made to have a drop in hemoglobin which might precipitate a transfusion but at this point I do not think it is required.  3.  Acute kidney injury superimposed on chronic kidney disease: This is likely related to GI blood losses and overall volume loss.  We will hydrate the patient and recheck creatinine in a.m.  4.  Atrial flutter: Patient currently on Xarelto and will continue beta-blocker but will hold Xarelto for now pending possible GI evaluation.  5.  Disease status post PTCA: We will continue Cozaar, metoprolol, Crestor.  Patient does not take aspirin due to being on novel oral anticoagulant.  We will also continue Maxide.  DVT prophylaxis: SCDs Code Status: Full code Family Communication: Wife who is an excellent record keep her hand has his complete history of available that she personally compiled. Disposition Plan: Likely home in a.m. after testing. Consults called: Dr. Michail Sermon with GI Admission status: Observation   Lady Deutscher MD [facility] Pager [phone]  If 7PM-7AM, please contact night-coverage [email or web address] Password [facility]  09/21/2017, 11:46 AM

## 2017-09-21 NOTE — ED Provider Notes (Signed)
[facility] EMERGENCY DEPARTMENT Provider Note   CSN: [id number] Arrival date & time: 09/21/17  9326     History   Chief Complaint Chief Complaint  Patient presents with  . GI Bleeding    HPI [name] is a 82 y.o. male.  82 year old male with past medical history including atrial fibrillation on Xarelto, prostate cancer, aortic stenosis who presents with GI bleeding.  2 weeks ago, the patient began having bloody stools.  He was seen 1 week ago in the [facility] and was started on laxatives for constipation.  At that point in time he was having too hard bowel movements weekly.  Since starting the new medication, he now has been having bowel movements daily that are loose. He continues to have mixed bright red and dark blood in his stool as well as bleeding into his underwear. His wife states that he has had to wear depends and toilet paper in his underwear due to ongoing bleeding throughout the day.  She checks his blood pressure daily and has noticed that it has been trending lower recently.  He endorses some lightheadedness and dizziness that is worse with standing and walking around.  At times he has felt off balance but has been able to catch himself with no falls or syncope.  He denies any abdominal pain, chest pain, shortness of breath, fevers, or hematuria.  He was having hematuria earlier this month and saw [facility], had a CT that was unremarkable and his hematuria has since resolved.  He has history of colon polyps but denies any history of GI bleeding.  Since his GI bleeding began, he stopped taking aspirin but continues to be on Xarelto.  The history is provided by the patient.    Past Medical History:  Diagnosis Date  . Arthritis    "knees; lower back" (08/09/2016)  . Atrial fibrillation ([place])    remote hx/notes 08/07/2016  . Episodes of trembling    "most of the time it's my left hand" (08/09/2016)  . High cholesterol   . History  of kidney stones   . Hypertension   . Melanoma of shoulder ([place]) 2000s   "left"  . Mitral regurgitation and mitral stenosis    hx/notes 08/07/2016  . Moderate aortic stenosis    hx/notes 08/07/2016  . Prostate cancer ([place]) 2011   hx/notes 08/07/2016    Patient Active Problem List   Diagnosis Date Noted  . Atrial flutter ([place]) 02/11/2017  . Chest pain 01/24/2017  . Post PTCA 08/09/2016  . Dyspnea on exertion 08/07/2016  . Coronary artery calcification seen on CAT scan 08/07/2016    Past Surgical History:  Procedure Laterality Date  . CARDIAC CATHETERIZATION  ~ [address], [place]"  . CARDIOVERSION N/A 01/26/2017   Procedure: CARDIOVERSION;  Surgeon: [name], MD;  Location: [facility]COPY;  Service: Cardiovascular;  Laterality: N/A;  . CATARACT EXTRACTION W/ INTRAOCULAR LENS  IMPLANT, BILATERAL Bilateral 2014-02/2016   left-right; hx/notes 08/07/2016  . CORONARY ANGIOPLASTY WITH STENT PLACEMENT  08/09/2016  . CORONARY ATHERECTOMY N/A 08/09/2016   Procedure: Coronary Atherectomy;  Surgeon: Adrian Prows, MD;  Location: [facility];  Service: Cardiovascular;  Laterality: N/A;  . CORONARY STENT INTERVENTION N/A 08/09/2016   Procedure: Coronary Stent Intervention;  Surgeon: Adrian Prows, MD;  Location: [facility];  Service: Cardiovascular;  Laterality: N/A;  LAD  . EYE SURGERY Bilateral 05/2016   "laser on both lenses"  . INGUINAL HERNIA  REPAIR Left 1991  . INSERTION PROSTATE RADIATION SEED  06/25/2009  . INTRAVASCULAR PRESSURE WIRE/FFR STUDY N/A 08/09/2016   Procedure: Intravascular Pressure Wire/FFR Study;  Surgeon: Adrian Prows, MD;  Location: [facility];  Service: Cardiovascular;  Laterality: N/A;  . LAPAROSCOPIC CHOLECYSTECTOMY  1993  . MELANOMA EXCISION Left 1990s   "shoulder"  . RIGHT/LEFT HEART CATH AND CORONARY ANGIOGRAPHY N/A 08/09/2016   Procedure: Right/Left Heart Cath and Coronary Angiography;  Surgeon: Adrian Prows, MD;  Location: [facility];  Service: Cardiovascular;  Laterality: N/A;  . TEE WITHOUT CARDIOVERSION N/A 01/25/2017   Procedure: TRANSESOPHAGEAL ECHOCARDIOGRAM (TEE);  Surgeon: [name], MD;  Location: [facility]OSCOPY;  Service: Cardiovascular;  Laterality: N/A;        Home Medications    Prior to Admission medications   Medication Sig Start Date End Date Taking? Authorizing Provider  amLODipine (NORVASC) 5 MG tablet Take 5 mg by mouth daily.  06/02/17  Yes [provider]  carbidopa-levodopa (SINEMET IR) 25-100 MG tablet 2 at 7am/2 at 11am/1 at 4pm Patient taking differently: Take 1 tablet by mouth 3 (three) times daily.  08/29/17  Yes Tat, Eustace Quail, DO  Cholecalciferol (VITAMIN D-3) 1000 units CAPS Take 1,000 Units by mouth daily.   Yes [provider]  furosemide (LASIX) 20 MG tablet Take 20 mg by mouth daily.  06/29/17  Yes [provider]  hydrocortisone (ANUSOL-HC) 25 MG suppository Place 25 mg rectally 2 (two) times daily.  09/15/17  Yes [provider]  losartan (COZAAR) 50 MG tablet Take 25 mg by mouth every evening.    Yes [provider]  metoprolol succinate (TOPROL-XL) 100 MG 24 hr tablet Take 100 mg by mouth every morning. Take with or immediately following a meal.    Yes [provider]  Rivaroxaban (XARELTO) 15 MG TABS tablet Take 1 tablet (15 mg total) by mouth daily with supper. 01/27/17  Yes Patwardhan, Manish J, MD  rosuvastatin (CRESTOR) 10 MG tablet Take 10 mg by mouth at bedtime.   Yes [provider]  tamsulosin (FLOMAX) 0.4 MG CAPS capsule Take 0.4 mg by mouth at bedtime.   Yes [provider]  triamterene-hydrochlorothiazide (MAXZIDE-25) 37.5-25 MG tablet Take 1 tablet by mouth daily.   Yes [provider]    Family History Family History  Problem Relation Age of Onset  . Heart attack Father   . Cerebral aneurysm Sister     Social History Social History   Tobacco Use  . Smoking status: Former  Smoker    Years: 0.20    Last attempt to quit: 1958    Years since quitting: 61.6  . Smokeless tobacco: Never Used  . Tobacco comment: "smoked  2 months when I was 20; non since" (08/09/2016)  Substance Use Topics  . Alcohol use: Yes    Alcohol/week: 8.4 oz    Types: 14 Shots of liquor per week    Comment: 2 drinks per day (canadian mist)  . Drug use: No     Allergies   Nsaids   Review of Systems Review of Systems All other systems reviewed and are negative except that which was mentioned in HPI   Physical Exam Updated Vital Signs BP (!) 123/59 (BP Location: Left Arm)   Pulse 64   Temp 97.6 F (36.4 C) (Oral)   Resp 15   Ht 5\' 11"  (1.803 m)   Wt 95.3 kg (210 lb)   SpO2 100%   BMI  29.29 kg/m   Physical Exam  Constitutional: He is oriented to person, place, and time. He appears well-developed and well-nourished. No distress.  HENT:  Head: Normocephalic and atraumatic.  Moist mucous membranes  Eyes: Conjunctivae are normal.  Neck: Neck supple.  Cardiovascular: Normal rate and normal heart sounds. An irregularly irregular rhythm present.  No murmur heard. Pulmonary/Chest: Effort normal and breath sounds normal.  Abdominal: Soft. Bowel sounds are normal. He exhibits no distension. There is no tenderness.  Genitourinary:  Genitourinary Comments: No external hemorrhoids, dried blood on perineum and perianal skin  Musculoskeletal: He exhibits no edema.  Neurological: He is alert and oriented to person, place, and time.  Fluent speech  Skin: Skin is warm and dry.  Psychiatric: He has a normal mood and affect. Judgment normal.  Nursing note and vitals reviewed.    ED Treatments / Results  Labs (all labs ordered are listed, but only abnormal results are displayed) Labs Reviewed  COMPREHENSIVE METABOLIC PANEL - Abnormal; Notable for the following components:      Result Value   BUN 32 (*)    Creatinine, Ser 2.13 (*)    Total Bilirubin 2.3 (*)    GFR calc non Af  Amer 28 (*)    GFR calc Af Amer 32 (*)    All other components within normal limits  CBC WITH DIFFERENTIAL/PLATELET - Abnormal; Notable for the following components:   RBC 3.43 (*)    Hemoglobin 9.7 (*)    HCT 31.5 (*)    All other components within normal limits  PROTIME-INR - Abnormal; Notable for the following components:   Prothrombin Time 21.4 (*)    All other components within normal limits  TYPE AND SCREEN  ABO/RH    EKG EKG Interpretation  Date/Time:  Thursday September 21 2017 09:24:25 EDT Ventricular Rate:  79 PR Interval:    QRS Duration: 131 QT Interval:  439 QTC Calculation: 504 R Axis:   -9 Text Interpretation:  Sinus rhythm Ventricular premature complex Short PR interval Right atrial enlargement Right bundle branch block Baseline wander in lead(s) V1 Partial missing lead(s): V1 RBBB new from previous Confirmed by Theotis Burrow [phone]) on 09/21/2017 9:40:42 AM   Radiology No results found.  Procedures Procedures (including critical care time)  Medications Ordered in ED Medications  sodium chloride 0.9 % bolus 500 mL (500 mLs Intravenous New Bag/Given 09/21/17 1100)     Initial Impression / Assessment and Plan / ED Course  I have reviewed the triage vital signs and the nursing notes.  Pertinent labs & imaging results that were available during my care of the patient were reviewed by me and considered in my medical decision making (see chart for details).    PT comfortable on exam w/ normal VS. He had dried blood in perineum on exam with no external hemorrhoids.  Lab work shows hemoglobin 9.7 which is dropped from recent values around 11.  He has had a mild bump in his creatinine to 2.13 today.  Discussed with the [facility] and they will see the patient in consultation.  Discussed admission with [facility] hospitalist, Dr. Evangeline Gula, and pt admitted for further work up.   Final Clinical Impressions(s) / ED Diagnoses   Final diagnoses:  Acute GI bleeding    ED  Discharge Orders    None       Little, Wenda [facility], MD 09/21/17 1116

## 2017-09-21 NOTE — Progress Notes (Signed)
Pt had large BM in toilet and incontinent bright red blood on underpad.  BM in toilet was melana as well as  splattered bright red blood on back of bowl.  Text message sent to Dr Michail Sermon. Waiting for returned call.

## 2017-09-21 NOTE — ED Triage Notes (Signed)
Pt reports have rectal bleeding for 2 weeks. Pt was seen by [facility] PA and given suppositories and laxatives. Reports the bleeding has gotten worse. Reports stool has been a combination of bright red blood and dark. Endorses dizziness and decrease in his BP.

## 2017-09-21 NOTE — Consult Note (Signed)
Referring Provider: Dr. Evangeline Gula  Primary Care Physician:  Via, Lennette Bihari, MD Primary Gastroenterologist:  Dr. Watt Climes  Reason for Consultation:  GI bleed  HPI: [name] is a 82 y.o. male with multiple medical problems who is on Xarelto for Afib and has had 2 weeks of daily rectal bleeding described as constantly dripping blood from the rectum that at times increases with his daily BM. Chronic constipation and recently his constipation worsened and he was given Linzess and a suppository when he saw Deliah Goody (PA) from our office last week. Denies any rectal pain, burning, or itching. Denies abdominal pain/N/V. Last colonoscopy in 09/2014 that showed sigmoid diverticulosis, cecal diverticulosis, internal and external hemorrhoids, and a sessile serrated polyp was removed in addition to a hyperplastic polyp. Has not had any bleeding since admit. Hgb 9.7 (reportedly 11.1 earlier this month). Denies NSAIDs. 2 alcohol drinks per day. Wife reports hematuria beginning of the month and saw urology for that. Wife at bedside.  Past Medical History:  Diagnosis Date  . Arthritis    "knees; lower back" (08/09/2016)  . Atrial fibrillation ([place])    remote hx/notes 08/07/2016  . Coronary artery disease   . Episodes of trembling    "most of the time it's my left hand" (08/09/2016)  . GI bleeding 08/2017  . High cholesterol   . History of kidney stones   . Hypertension   . Melanoma of shoulder ([place]) 2000s   "left"  . Mitral regurgitation and mitral stenosis    hx/notes 08/07/2016  . Moderate aortic stenosis    hx/notes 08/07/2016  . Prostate cancer ([place]) 2011   hx/notes 08/07/2016    Past Surgical History:  Procedure Laterality Date  . CARDIAC CATHETERIZATION  ~ [address] [place]"  . CARDIOVERSION N/A 01/26/2017   Procedure: CARDIOVERSION;  Surgeon: [name], MD;  Location: [facility] ENDOSCOPY;  Service: Cardiovascular;  Laterality: N/A;  . CATARACT EXTRACTION W/ INTRAOCULAR LENS   IMPLANT, BILATERAL Bilateral 2014-02/2016   left-right; hx/notes 08/07/2016  . CORONARY ANGIOPLASTY WITH STENT PLACEMENT  08/09/2016  . CORONARY ATHERECTOMY N/A 08/09/2016   Procedure: Coronary Atherectomy;  Surgeon: Adrian Prows, MD;  Location: [facility];  Service: Cardiovascular;  Laterality: N/A;  . CORONARY STENT INTERVENTION N/A 08/09/2016   Procedure: Coronary Stent Intervention;  Surgeon: Adrian Prows, MD;  Location: [facility];  Service: Cardiovascular;  Laterality: N/A;  LAD  . EYE SURGERY Bilateral 05/2016   "laser on both lenses"  . INGUINAL HERNIA REPAIR Left 1991  . INSERTION PROSTATE RADIATION SEED  06/25/2009  . INTRAVASCULAR PRESSURE WIRE/FFR STUDY N/A 08/09/2016   Procedure: Intravascular Pressure Wire/FFR Study;  Surgeon: Adrian Prows, MD;  Location: [facility];  Service: Cardiovascular;  Laterality: N/A;  . LAPAROSCOPIC CHOLECYSTECTOMY  1993  . MELANOMA EXCISION Left 1990s   "shoulder"  . RIGHT/LEFT HEART CATH AND CORONARY ANGIOGRAPHY N/A 08/09/2016   Procedure: Right/Left Heart Cath and Coronary Angiography;  Surgeon: Adrian Prows, MD;  Location: [facility];  Service: Cardiovascular;  Laterality: N/A;  . TEE WITHOUT CARDIOVERSION N/A 01/25/2017   Procedure: TRANSESOPHAGEAL ECHOCARDIOGRAM (TEE);  Surgeon: [name], MD;  Location: [facility] ENDOSCOPY;  Service: Cardiovascular;  Laterality: N/A;    Prior to Admission medications   Medication Sig Start Date End Date Taking? Authorizing Provider  amLODipine (NORVASC) 5 MG tablet Take 5 mg by mouth daily.  06/02/17  Yes [provider]  carbidopa-levodopa (SINEMET IR) 25-100 MG tablet 2 at  7am/2 at 11am/1 at 4pm Patient taking differently: Take 1 tablet by mouth 3 (three) times daily.  08/29/17  Yes Tat, Eustace Quail, DO  Cholecalciferol (VITAMIN D-3) 1000 units CAPS Take 1,000 Units by mouth daily.   Yes [provider]  furosemide (LASIX) 20 MG tablet Take 20 mg by mouth daily.  06/29/17   Yes [provider]  hydrocortisone (ANUSOL-HC) 25 MG suppository Place 25 mg rectally 2 (two) times daily.  09/15/17  Yes [provider]  losartan (COZAAR) 50 MG tablet Take 25 mg by mouth every evening.    Yes [provider]  metoprolol succinate (TOPROL-XL) 100 MG 24 hr tablet Take 100 mg by mouth every morning. Take with or immediately following a meal.    Yes [provider]  Rivaroxaban (XARELTO) 15 MG TABS tablet Take 1 tablet (15 mg total) by mouth daily with supper. 01/27/17  Yes Patwardhan, Manish J, MD  rosuvastatin (CRESTOR) 10 MG tablet Take 10 mg by mouth at bedtime.   Yes [provider]  tamsulosin (FLOMAX) 0.4 MG CAPS capsule Take 0.4 mg by mouth at bedtime.   Yes [provider]  triamterene-hydrochlorothiazide (MAXZIDE-25) 37.5-25 MG tablet Take 1 tablet by mouth daily.   Yes [provider]    Scheduled Meds: . [START ON 09/22/2017] amLODipine  5 mg Oral Daily  . carbidopa-levodopa  1 tablet Oral TID  . [START ON 09/22/2017] cholecalciferol  1,000 Units Oral Daily  . losartan  25 mg Oral QPM  . [START ON 09/22/2017] metoprolol succinate  100 mg Oral q morning - 10a  . polyethylene glycol  17 g Oral Daily  . rosuvastatin  10 mg Oral QHS  . tamsulosin  0.4 mg Oral QHS  . [START ON 09/22/2017] triamterene-hydrochlorothiazide  1 tablet Oral Daily   Continuous Infusions: . 0.9 % NaCl with KCl 20 mEq / L 75 mL/hr at 09/21/17 1318   PRN Meds:.acetaminophen **OR** acetaminophen, ondansetron **OR** ondansetron (ZOFRAN) IV, traMADol, traZODone  Allergies as of 09/21/2017 - Review Complete 09/21/2017  Allergen Reaction Noted  . Nsaids Other (See Comments) 02/11/2017    Family History  Problem Relation Age of Onset  . Heart attack Father   . Cerebral aneurysm Sister     Social History   Socioeconomic History  . Marital status: Divorced    Spouse name: Not on file  . Number of children: Not on file  . Years  of education: Not on file  . Highest education level: Not on file  Occupational History  . 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 Financial resource strain: Not on file  . Food insecurity:    Worry: Not on file    Inability: Not on file  . Transportation needs:    Medical: Not on file    Non-medical: Not on file  Tobacco Use  . Smoking status: Never Smoker  . Smokeless tobacco: Never Used  . Tobacco comment: "smoked  2 months when I was 20; non since" (08/09/2016)  Substance and Sexual Activity  . Alcohol use: Yes    Alcohol/week: 8.4 oz    Types: 14 Shots of liquor per week    Comment: 2 drinks per day (canadian mist)  . Drug use: No  . Sexual activity: Not Currently  Lifestyle  . Physical activity:    Days per week: Not on file    Minutes per session: Not on file  . Stress: Not  on file  Relationships  . Social connections:    Talks on phone: Not on file    Gets together: Not on file    Attends religious service: Not on file    Active member of club or organization: Not on file    Attends meetings of clubs or organizations: Not on file    Relationship status: Not on file  . Intimate partner violence:    Fear of current or ex partner: Not on file    Emotionally abused: Not on file    Physically abused: Not on file    Forced sexual activity: Not on file  Other Topics Concern  . Not on file  Social History Narrative   Retired Hotel manager with [employer]    Review of Systems: All negative except as stated above in HPI.  Physical Exam: Vital signs: Vitals:   09/21/17 1230 09/21/17 1319  BP: 110/68 116/68  Pulse: 62 70  Resp: 16   Temp:    SpO2: 99% 100%  T 97.6  Last BM Date: 09/21/17 General:   Alert,  Well-developed, well-nourished, pleasant and cooperative in NAD, elderly Head: normocephalic, atraumatic Eyes: anicteric sclera ENT: oropharynx clear Neck: supple, nontender Lungs:  Clear throughout to auscultation.   No  wheezes, crackles, or rhonchi. No acute distress. Heart:  Regular rate and rhythm; no murmurs, clicks, rubs,  or gallops. Abdomen: soft, nontender, nondistended, +BS  Rectal:  Deferred Ext: no edema  GI:  Lab Results: Recent Labs    09/21/17 0931  WBC 4.5  HGB 9.7*  HCT 31.5*  PLT 151   BMET Recent Labs    09/21/17 0931  NA 138  K 3.8  CL 99  CO2 29  GLUCOSE 97  BUN 32*  CREATININE 2.13*  CALCIUM 9.3   LFT Recent Labs    09/21/17 0931  PROT 6.9  ALBUMIN 4.0  AST 24  ALT 5  ALKPHOS 101  BILITOT 2.3*   PT/INR Recent Labs    09/21/17 0931  LABPROT 21.4*  INR 1.87     Studies/Results: No results found.  Impression/Plan: 82 yo with CAD, history of prostate cancer, Afib on Xarelto presents with hematochezia in the setting of worsened constipation. Bleeding seems to be resolving. Conservative management but if bleeding persists then will need an updated colonoscopy during this hospitalization. If bleeding resolves, then may need an updated colonoscopy as an outpt in the near future. Full liquid diet. Dr. Oletta Lamas to f/u tomorrow and decide whether inpt colonoscopy needed, which will depend on whether additional bleeding happens over the next 12-24 hours.     LOS: 0 days   [place] C.  09/21/2017, 4:35 PM  Questions please call [phone]

## 2017-09-21 NOTE — Progress Notes (Addendum)
Wife of patient just came to desk to let me know that patient has blood in the bed and the toilet is full of blood. Dr. Michail Sermon had just left the room but this happened right after he left and wife felt like he needed to know what was happening. Tried to page [facility] and wife tried to call office to see if they would page him to come back.  Called [facility] and got pager for Dr. Michail Sermon [phone]

## 2017-09-22 DIAGNOSIS — I4892 Unspecified atrial flutter: Secondary | ICD-10-CM | POA: Diagnosis present

## 2017-09-22 DIAGNOSIS — I2510 Atherosclerotic heart disease of native coronary artery without angina pectoris: Secondary | ICD-10-CM | POA: Diagnosis present

## 2017-09-22 DIAGNOSIS — N183 Chronic kidney disease, stage 3 (moderate): Secondary | ICD-10-CM | POA: Diagnosis not present

## 2017-09-22 DIAGNOSIS — Z8249 Family history of ischemic heart disease and other diseases of the circulatory system: Secondary | ICD-10-CM | POA: Diagnosis not present

## 2017-09-22 DIAGNOSIS — Z955 Presence of coronary angioplasty implant and graft: Secondary | ICD-10-CM | POA: Diagnosis not present

## 2017-09-22 DIAGNOSIS — Z85820 Personal history of malignant melanoma of skin: Secondary | ICD-10-CM | POA: Diagnosis not present

## 2017-09-22 DIAGNOSIS — E860 Dehydration: Secondary | ICD-10-CM | POA: Diagnosis present

## 2017-09-22 DIAGNOSIS — R17 Unspecified jaundice: Secondary | ICD-10-CM | POA: Diagnosis present

## 2017-09-22 DIAGNOSIS — Z9861 Coronary angioplasty status: Secondary | ICD-10-CM | POA: Diagnosis not present

## 2017-09-22 DIAGNOSIS — K649 Unspecified hemorrhoids: Secondary | ICD-10-CM | POA: Diagnosis not present

## 2017-09-22 DIAGNOSIS — K641 Second degree hemorrhoids: Secondary | ICD-10-CM | POA: Diagnosis present

## 2017-09-22 DIAGNOSIS — N189 Chronic kidney disease, unspecified: Secondary | ICD-10-CM | POA: Diagnosis not present

## 2017-09-22 DIAGNOSIS — Z86010 Personal history of colonic polyps: Secondary | ICD-10-CM | POA: Diagnosis not present

## 2017-09-22 DIAGNOSIS — K625 Hemorrhage of anus and rectum: Secondary | ICD-10-CM | POA: Diagnosis not present

## 2017-09-22 DIAGNOSIS — D72819 Decreased white blood cell count, unspecified: Secondary | ICD-10-CM | POA: Diagnosis present

## 2017-09-22 DIAGNOSIS — N179 Acute kidney failure, unspecified: Secondary | ICD-10-CM | POA: Diagnosis present

## 2017-09-22 DIAGNOSIS — D62 Acute posthemorrhagic anemia: Secondary | ICD-10-CM | POA: Diagnosis present

## 2017-09-22 DIAGNOSIS — Z8546 Personal history of malignant neoplasm of prostate: Secondary | ICD-10-CM | POA: Diagnosis not present

## 2017-09-22 DIAGNOSIS — Z7901 Long term (current) use of anticoagulants: Secondary | ICD-10-CM | POA: Diagnosis not present

## 2017-09-22 DIAGNOSIS — K922 Gastrointestinal hemorrhage, unspecified: Secondary | ICD-10-CM | POA: Diagnosis not present

## 2017-09-22 DIAGNOSIS — K5900 Constipation, unspecified: Secondary | ICD-10-CM | POA: Diagnosis present

## 2017-09-22 DIAGNOSIS — I481 Persistent atrial fibrillation: Secondary | ICD-10-CM | POA: Diagnosis present

## 2017-09-22 DIAGNOSIS — K648 Other hemorrhoids: Secondary | ICD-10-CM | POA: Diagnosis not present

## 2017-09-22 DIAGNOSIS — D696 Thrombocytopenia, unspecified: Secondary | ICD-10-CM | POA: Diagnosis present

## 2017-09-22 DIAGNOSIS — I4891 Unspecified atrial fibrillation: Secondary | ICD-10-CM | POA: Diagnosis not present

## 2017-09-22 DIAGNOSIS — I129 Hypertensive chronic kidney disease with stage 1 through stage 4 chronic kidney disease, or unspecified chronic kidney disease: Secondary | ICD-10-CM | POA: Diagnosis present

## 2017-09-22 DIAGNOSIS — G20 Parkinson's disease: Secondary | ICD-10-CM | POA: Diagnosis present

## 2017-09-22 DIAGNOSIS — I482 Chronic atrial fibrillation: Secondary | ICD-10-CM | POA: Diagnosis not present

## 2017-09-22 LAB — BASIC METABOLIC PANEL
Anion gap: 9 (ref 5–15)
BUN: 28 mg/dL — ABNORMAL HIGH (ref 8–23)
CO2: 29 mmol/L (ref 22–32)
Calcium: 8.9 mg/dL (ref 8.9–10.3)
Chloride: 100 mmol/L (ref 98–111)
Creatinine, Ser: 1.85 mg/dL — ABNORMAL HIGH (ref 0.61–1.24)
GFR calc Af Amer: 38 mL/min — ABNORMAL LOW (ref >60)
GFR calc non Af Amer: 33 mL/min — ABNORMAL LOW (ref >60)
Glucose, Bld: 97 mg/dL (ref 70–99)
Potassium: 4.1 mmol/L (ref 3.5–5.1)
Sodium: 138 mmol/L (ref 135–145)

## 2017-09-22 LAB — CBC
HCT: 28.1 % — ABNORMAL LOW (ref 39.0–52.0)
Hemoglobin: 8.8 g/dL — ABNORMAL LOW (ref 13.0–17.0)
MCH: 28.9 pg (ref 26.0–34.0)
MCHC: 31.3 g/dL (ref 30.0–36.0)
MCV: 92.1 fL (ref 78.0–100.0)
Platelets: 135 10*3/uL — ABNORMAL LOW (ref 150–400)
RBC: 3.05 MIL/uL — ABNORMAL LOW (ref 4.22–5.81)
RDW: 14.2 % (ref 11.5–15.5)
WBC: 3.7 10*3/uL — ABNORMAL LOW (ref 4.0–10.5)

## 2017-09-22 NOTE — Progress Notes (Signed)
Patient had medium BM with bright red fluid covering top of water and splattered on sides. Patient states this is an improvement from a few days ago. Patient denied any symptoms of dizziness, feeling faint, or flushed. Will continue to monitor.

## 2017-09-22 NOTE — Progress Notes (Signed)
[facility] PROGRESS NOTE Subjective Patient doing well.  Still having a small amount of bright red blood.  He is only been off the Xarelto one day.  He took it Wednesday night.  His colonoscopies are up-to-date.  He has known diverticulosis.  He tells me that he has a history of A. fib but is in sinus rhythm nearly constantly  Objective: Vital signs in last 24 hours: Temp:  [98.7 F (37.1 C)-98.9 F (37.2 C)] 98.7 F (37.1 C) (07/26 0439) Pulse Rate:  [62-74] 68 (07/26 0439) Resp:  [13-18] 18 (07/26 0439) BP: (94-126)/(59-79) 124/79 (07/26 0439) SpO2:  [98 %-100 %] 99 % (07/26 0439) Weight:  [94.2 kg (207 lb 10.8 oz)] 94.2 kg (207 lb 10.8 oz) (07/25 1306) Last BM Date: 09/21/17  Intake/Output from previous day: 07/25 0701 - 07/26 0700 In: 1400 [I.V.:900; IV Piggyback:500] Out: 775 [Urine:775] Intake/Output this shift: Total I/O In: 420 [P.O.:420] Out: -   PE: Alert and oriented no distress General--  Abdomen--soft and nontender  Lab Results: Recent Labs    09/21/17 0931 09/22/17 0409  WBC 4.5 3.7*  HGB 9.7* 8.8*  HCT 31.5* 28.1*  PLT 151 135*   BMET Recent Labs    09/21/17 0931 09/22/17 0409  NA 138 138  K 3.8 4.1  CL 99 100  CO2 29 29  CREATININE 2.13* 1.85*   LFT Recent Labs    09/21/17 0931  PROT 6.9  AST 24  ALT 5  ALKPHOS 101  BILITOT 2.3*   PT/INR Recent Labs    09/21/17 0931  LABPROT 21.4*  INR 1.87   PANCREAS No results for input(s): LIPASE in the last 72 hours.       Studies/Results: No results found.  Medications: I have reviewed the patient's current medications.  Assessment:   1.  Lower GI bleed.  Probably diverticular exacerbated by anticoagulation.  His hemoglobin has dropped some but he does appear to be slowing down.  He will need to be off the Xarelto for some time.   Plan: We will continue to hold Xarelto and keep him on liquids and MiraLAX.  If we can hold the Xarelto for several days and his  bleeding stops completely I do not feel that we need further diagnostic testing.  We will continue to follow.   [name] 09/22/2017, 9:57 AM  This note was created using voice recognition software. Minor errors may Have occurred unintentionally.  Pager: [phone] If no answer or after hours call [phone]

## 2017-09-22 NOTE — Progress Notes (Addendum)
PROGRESS NOTE  [name]  TWS:[id number] DOB: 1935-08-27 DOA: 09/21/2017 PCP: Dineen Kid, MD   Brief Narrative: [name] is a 82 y.o. male with a history of AFib on xarelto, CAD s/p PCI in June 2018, and sigmoid diverticulosis on colonoscopy 2016 and recent hematuria who presented to the ED with 2 weeks of GI bleeding, bloody BMs in setting of constipation. Hgb noted to be down to 9 from baseline of 11. He was admitted and GI consulted. Xarelto has been held and bleeding is ongoing though he remains hemodynamically stable so GI is planning observation for now.   Assessment & Plan: Principal Problem:   Acute lower GI bleeding Active Problems:   Post PTCA   Atrial flutter (HCC)   Acute blood loss anemia   Acute kidney injury superimposed on CKD (HCC)  Acute lower GI bleeding with acute blood loss anemia: Followed by Dr. Watt Climes as outpatient. - Continue liquid diet per GI, will continue gentle IVF's for now as renal function isn't improved to baseline yet - Miralax to Tx constipation.  - Serial CBCs. All cell lines down with fluids. Check INR as well.  - Timing of colonoscopy is based on whether he continues bleeding per GI.  - Has chronic mild thrombocytopenia, elevated TBili on admission. Recheck Bili, if still elevated would consider RUQ U/S.   AFib:  - Rate is controlled, continue metoprolol - Holding xarelto as above. CHA2DS2-VASc is 4.   CAD s/p PCI, HTN: Seen by Dr. Virgina Jock, recently taken off aspirin, formerly taken off plavix following PCI in June 2018.  - Will continue to hold antiplatelets.  - Monitor for evidence of ACS, none currently.  - Continue metoprolol, ARB, statin, norvasc, triamterene, HCTZ.  AKI on stage III CKD: Baseline creatinine likely 1.5-1.6, up to 2.13 on admission. Likely prerenal, improved with fluids.  - Avoid nephrotoxins, monitor BMP  Parkinson's disease: Dx late 2018, followed by Dr. Carles Collet. - Continue sinemet  History  of prostate CA Tx w/brachytherapy and recent hematuria:  - Follow up with urology.   DVT prophylaxis: SCDs Code Status: Full Family Communication: None  Disposition Plan: Home once stable  Consultants:   [facility]  Procedures:   None  Antimicrobials:  None   Subjective: Red blood with every stool still, but fewer stools since arrival. No abdominal pain, nausea or vomiting.   Objective: Vitals:   09/21/17 1319 09/21/17 2123 09/22/17 0439 09/22/17 1337  BP: 116/68 (!) 94/59 124/79 125/66  Pulse: 70 74 68 67  Resp:  18 18 17   Temp:  98.9 F (37.2 C) 98.7 F (37.1 C) 98.2 F (36.8 C)  TempSrc:  Oral Oral Oral  SpO2: 100% 99% 99% 100%  Weight:      Height:        Intake/Output Summary (Last 24 hours) at 09/22/2017 1635 Last data filed at 09/22/2017 1426 Gross per 24 hour  Intake 1740 ml  Output 450 ml  Net 1290 ml   Filed Weights   09/21/17 0913 09/21/17 1306  Weight: 95.3 kg (210 lb) 94.2 kg (207 lb 10.8 oz)   Gen: 82 y.o. male in no distress Pulm: Non-labored breathing room air. Clear to auscultation bilaterally.  CV: Rate controlled, irreg. No murmur, rub, or gallop. No JVD, no pedal edema. GI: Abdomen soft, non-tender, non-distended, with normoactive bowel sounds. No organomegaly or masses felt. Ext: Warm, no deformities Skin: No rashes, lesions no ulcers Neuro: Alert and oriented. No focal neurological deficits. Psych: Judgement and  insight appear normal. Mood & affect appropriate.   Data Reviewed: I have personally reviewed following labs and imaging studies  CBC: Recent Labs  Lab 09/21/17 0931 09/22/17 0409  WBC 4.5 3.7*  NEUTROABS 3.1  --   HGB 9.7* 8.8*  HCT 31.5* 28.1*  MCV 91.8 92.1  PLT 151 664*   Basic Metabolic Panel: Recent Labs  Lab 09/21/17 0931 09/22/17 0409  NA 138 138  K 3.8 4.1  CL 99 100  CO2 29 29  GLUCOSE 97 97  BUN 32* 28*  CREATININE 2.13* 1.85*  CALCIUM 9.3 8.9   GFR: Estimated Creatinine Clearance: 38 mL/min  (A) (by C-G formula based on SCr of 1.85 mg/dL (H)). Liver Function Tests: Recent Labs  Lab 09/21/17 0931  AST 24  ALT 5  ALKPHOS 101  BILITOT 2.3*  PROT 6.9  ALBUMIN 4.0   Coagulation Profile: Recent Labs  Lab 09/21/17 0931  INR 1.87   Radiology Studies: No results found.  Scheduled Meds: . amLODipine  5 mg Oral Daily  . carbidopa-levodopa  1 tablet Oral TID  . cholecalciferol  1,000 Units Oral Daily  . losartan  25 mg Oral QPM  . metoprolol succinate  100 mg Oral q morning - 10a  . polyethylene glycol  17 g Oral Daily  . rosuvastatin  10 mg Oral QHS  . tamsulosin  0.4 mg Oral QHS  . triamterene-hydrochlorothiazide  1 tablet Oral Daily   Continuous Infusions: . 0.9 % NaCl with KCl 20 mEq / L 75 mL/hr at 09/22/17 0302     LOS: 0 days   Time spent: 25 minutes.  Patrecia Pour, MD [facility] [email or web address] Password [email or web address] 09/22/2017, 4:35 PM

## 2017-09-23 ENCOUNTER — Encounter (HOSPITAL_COMMUNITY): Payer: Self-pay | Admitting: Gastroenterology

## 2017-09-23 DIAGNOSIS — Z9861 Coronary angioplasty status: Secondary | ICD-10-CM

## 2017-09-23 LAB — COMPREHENSIVE METABOLIC PANEL
ALT: 7 U/L (ref 0–44)
AST: 22 U/L (ref 15–41)
Albumin: 3.1 g/dL — ABNORMAL LOW (ref 3.5–5.0)
Alkaline Phosphatase: 83 U/L (ref 38–126)
Anion gap: 9 (ref 5–15)
BUN: 23 mg/dL (ref 8–23)
CO2: 26 mmol/L (ref 22–32)
Calcium: 8.8 mg/dL — ABNORMAL LOW (ref 8.9–10.3)
Chloride: 102 mmol/L (ref 98–111)
Creatinine, Ser: 1.65 mg/dL — ABNORMAL HIGH (ref 0.61–1.24)
GFR calc Af Amer: 44 mL/min — ABNORMAL LOW (ref >60)
GFR calc non Af Amer: 38 mL/min — ABNORMAL LOW (ref >60)
Glucose, Bld: 95 mg/dL (ref 70–99)
Potassium: 4.5 mmol/L (ref 3.5–5.1)
Sodium: 137 mmol/L (ref 135–145)
Total Bilirubin: 2.1 mg/dL — ABNORMAL HIGH (ref 0.3–1.2)
Total Protein: 5.7 g/dL — ABNORMAL LOW (ref 6.5–8.1)

## 2017-09-23 LAB — CBC
HCT: 27.7 % — ABNORMAL LOW (ref 39.0–52.0)
Hemoglobin: 8.6 g/dL — ABNORMAL LOW (ref 13.0–17.0)
MCH: 28.8 pg (ref 26.0–34.0)
MCHC: 31 g/dL (ref 30.0–36.0)
MCV: 92.6 fL (ref 78.0–100.0)
Platelets: 133 10*3/uL — ABNORMAL LOW (ref 150–400)
RBC: 2.99 MIL/uL — ABNORMAL LOW (ref 4.22–5.81)
RDW: 14.1 % (ref 11.5–15.5)
WBC: 3.6 10*3/uL — ABNORMAL LOW (ref 4.0–10.5)

## 2017-09-23 MED ORDER — PANTOPRAZOLE SODIUM 40 MG PO TBEC
40.0000 mg | DELAYED_RELEASE_TABLET | Freq: Every day | ORAL | Status: DC
Start: 2017-09-23 — End: 2017-09-29
  Administered 2017-09-23 – 2017-09-29 (×6): 40 mg via ORAL
  Filled 2017-09-23 (×6): qty 1

## 2017-09-23 MED ORDER — POLYETHYLENE GLYCOL 3350 17 G PO PACK
17.0000 g | PACK | Freq: Two times a day (BID) | ORAL | Status: DC
  Administered 2017-09-23 – 2017-09-28 (×6): 17 g via ORAL
  Filled 2017-09-23 (×10): qty 1

## 2017-09-23 NOTE — Progress Notes (Signed)
PROGRESS NOTE  [name]  WUJ:[id number] DOB: 01/14/1936 DOA: 09/21/2017 PCP: Dineen Kid, MD   Brief Narrative: [name] is a 82 y.o. male with a history of AFib on xarelto, CAD s/p PCI in June 2018, and sigmoid diverticulosis on colonoscopy 2016 and recent hematuria who presented to the ED with 2 weeks of GI bleeding, bloody BMs in setting of constipation. Hgb noted to be down to 9 from baseline of 11. He was admitted and GI consulted. Xarelto has been held and bleeding is ongoing though he remains hemodynamically stable so GI is planning observation, possible colonoscopy 7/29 if hgb drops further.  Assessment & Plan: Principal Problem:   Acute lower GI bleeding Active Problems:   Post PTCA   Atrial flutter (HCC)   Acute blood loss anemia   Acute kidney injury superimposed on CKD (HCC)  Acute lower GI bleeding with acute blood loss anemia: Followed by Dr. Watt Climes as outpatient, known diverticulosis - Diet per GI > full liquids. DC IVF's - Miralax to Tx/prevent constipation.  - Recheck CBC in serial fashion. Hgb slightly down again. Check INR in AM as well.  - Timing of colonoscopy is based on whether he continues bleeding per GI.  - Has chronic mild thrombocytopenia and mild leukopenia, elevated TBili on admission. Check RUQ U/S, would likely benefit from hematology work up if continues.  AFib:  - Rate is controlled, continue metoprolol - Holding xarelto as above. CHA2DS2-VASc is 4.   CAD s/p PCI, HTN: Seen by Dr. Virgina Jock, recently taken off aspirin, formerly taken off plavix following PCI in June 2018. EF 50-55% in Nov 2018. - Will continue to hold antiplatelets.  - Monitor for evidence of ACS, none currently.  - Continue metoprolol, ARB, statin, norvasc, triamterene, HCTZ.  AKI on stage III CKD: Baseline creatinine likely 1.5-1.6, up to 2.13 on admission. Likely prerenal, improved with fluids.  - Improving near baseline, taking po, will stop IVF. -  Avoid nephrotoxins, monitor BMP  Parkinson's disease: Dx late 2018, followed by Dr. Carles Collet. No worsening of symptoms noted. - Continue sinemet  History of prostate CA Tx w/brachytherapy and recent hematuria:  - Follow up with urology.   DVT prophylaxis: SCDs Code Status: Full Family Communication: None at bedside this AM. GI spoke with SO. Disposition Plan: Home once stable  Consultants:   [facility]  Procedures:   None  Antimicrobials:  None   Subjective: Red blood with every stool still, but fewer stools since arrival. No abdominal pain, nausea or vomiting.   Objective: Vitals:   09/22/17 1337 09/22/17 2059 09/23/17 0443 09/23/17 1422  BP: 125/66 125/71 118/74 94/68  Pulse: 67 62 (!) 53 76  Resp: 17  18 18   Temp: 98.2 F (36.8 C) 98.2 F (36.8 C) 98.2 F (36.8 C) 97.7 F (36.5 C)  TempSrc: Oral Oral Oral Oral  SpO2: 100% 100% 97% 98%  Weight:      Height:        Intake/Output Summary (Last 24 hours) at 09/23/2017 1537 Last data filed at 09/23/2017 0719 Gross per 24 hour  Intake 900 ml  Output 675 ml  Net 225 ml   Filed Weights   09/21/17 0913 09/21/17 1306  Weight: 95.3 kg (210 lb) 94.2 kg (207 lb 10.8 oz)   Gen: 82 y.o. male in no distress Pulm: Non-labored breathing room air. Clear to auscultation bilaterally.  CV: Rate controlled, irreg. No murmur, rub, or gallop. No JVD, no pedal edema. GI: Abdomen soft, non-tender,  non-distended, with normoactive bowel sounds. No organomegaly or masses felt. Ext: Warm, no deformities Skin: No rashes, lesions no ulcers Neuro: Alert and oriented. No focal neurological deficits. Psych: Judgement and insight appear normal. Mood & affect appropriate.   Data Reviewed: I have personally reviewed following labs and imaging studies  CBC: Recent Labs  Lab 09/21/17 0931 09/22/17 0409 09/23/17 0312  WBC 4.5 3.7* 3.6*  NEUTROABS 3.1  --   --   HGB 9.7* 8.8* 8.6*  HCT 31.5* 28.1* 27.7*  MCV 91.8 92.1 92.6  PLT 151 135*  458*   Basic Metabolic Panel: Recent Labs  Lab 09/21/17 0931 09/22/17 0409 09/23/17 0312  NA 138 138 137  K 3.8 4.1 4.5  CL 99 100 102  CO2 29 29 26   GLUCOSE 97 97 95  BUN 32* 28* 23  CREATININE 2.13* 1.85* 1.65*  CALCIUM 9.3 8.9 8.8*   GFR: Estimated Creatinine Clearance: 42.6 mL/min (A) (by C-G formula based on SCr of 1.65 mg/dL (H)). Liver Function Tests: Recent Labs  Lab 09/21/17 0931 09/23/17 0312  AST 24 22  ALT 5 7  ALKPHOS 101 83  BILITOT 2.3* 2.1*  PROT 6.9 5.7*  ALBUMIN 4.0 3.1*   Coagulation Profile: Recent Labs  Lab 09/21/17 0931  INR 1.87   Radiology Studies: No results found.  Scheduled Meds: . amLODipine  5 mg Oral Daily  . carbidopa-levodopa  1 tablet Oral TID  . cholecalciferol  1,000 Units Oral Daily  . losartan  25 mg Oral QPM  . metoprolol succinate  100 mg Oral q morning - 10a  . pantoprazole  40 mg Oral Daily  . polyethylene glycol  17 g Oral BID  . rosuvastatin  10 mg Oral QHS  . tamsulosin  0.4 mg Oral QHS  . triamterene-hydrochlorothiazide  1 tablet Oral Daily   Continuous Infusions:    LOS: 1 day   Time spent: 25 minutes.  Patrecia Pour, MD [facility] [email or web address] Password [facility] 09/23/2017, 3:37 PM

## 2017-09-23 NOTE — Progress Notes (Signed)
[facility] PROGRESS NOTE Subjective Patient notes no further bleeding overnight. He has been on Xarelto that is been on hold now for several days. His hemoglobin has dropped to 8.6 but this is fairly stable.  Objective: Vital signs in last 24 hours: Temp:  [98.2 F (36.8 C)] 98.2 F (36.8 C) (07/27 0443) Pulse Rate:  [53-67] 53 (07/27 0443) Resp:  [17-18] 18 (07/27 0443) BP: (118-125)/(66-74) 118/74 (07/27 0443) SpO2:  [97 %-100 %] 97 % (07/27 0443) Last BM Date: 09/22/17  Intake/Output from previous day: 07/26 0701 - 07/27 0700 In: 1740 [P.O.:840; I.V.:900] Out: 775 [Urine:775] Intake/Output this shift: Total I/O In: -  Out: 200 [Urine:200]  PE: General--alert and oriented  Abdomen--soft and nontender  Lab Results: Recent Labs    09/21/17 0931 09/22/17 0409 09/23/17 0312  WBC 4.5 3.7* 3.6*  HGB 9.7* 8.8* 8.6*  HCT 31.5* 28.1* 27.7*  PLT 151 135* 133*   BMET Recent Labs    09/21/17 0931 09/22/17 0409 09/23/17 0312  NA 138 138 137  K 3.8 4.1 4.5  CL 99 100 102  CO2 29 29 26   CREATININE 2.13* 1.85* 1.65*   LFT Recent Labs    09/21/17 0931 09/23/17 0312  PROT 6.9 5.7*  AST 24 22  ALT 5 7  ALKPHOS 101 83  BILITOT 2.3* 2.1*   PT/INR Recent Labs    09/21/17 0931  LABPROT 21.4*  INR 1.87   PANCREAS No results for input(s): LIPASE in the last 72 hours.       Studies/Results: No results found.  Medications: I have reviewed the patient's current medications.  Assessment:   1. GI Bleeding. This is probably a lower G.I. Bleed. He has had diverticulosis on previous colonoscopies.last colonoscopy 2016 with diverticulosis hemorrhoids and a sessile serrated polyp removed. 2. Parkinson's disease. This is a new diagnosis girlfriend has concerns about sedation 3. Atrial fibrillation. On chronic anticoagulation. Xarelto has been on hold now for several days   Plan: 1. People keep on a liquid diet and keep own Miralax. If his hemoglobin  drops anymore I think we should consider a colonoscopy on Monday. Have discussed this with patient and he is agreeable.   [name] 09/23/2017, 8:08 AM  This note was created using voice recognition software. Minor errors may Have occurred unintentionally.  Pager: [phone] If no answer or after hours call [phone]

## 2017-09-24 ENCOUNTER — Inpatient Hospital Stay (HOSPITAL_COMMUNITY): Payer: Medicare Other

## 2017-09-24 LAB — CBC
HCT: 27.7 % — ABNORMAL LOW (ref 39.0–52.0)
HCT: 29.3 % — ABNORMAL LOW (ref 39.0–52.0)
HCT: 28.2 % — ABNORMAL LOW (ref 39.0–52.0)
Hemoglobin: 8.4 g/dL — ABNORMAL LOW (ref 13.0–17.0)
Hemoglobin: 8.9 g/dL — ABNORMAL LOW (ref 13.0–17.0)
Hemoglobin: 8.5 g/dL — ABNORMAL LOW (ref 13.0–17.0)
MCH: 28.4 pg (ref 26.0–34.0)
MCH: 28.3 pg (ref 26.0–34.0)
MCH: 28.2 pg (ref 26.0–34.0)
MCHC: 30.3 g/dL (ref 30.0–36.0)
MCHC: 30.4 g/dL (ref 30.0–36.0)
MCHC: 30.1 g/dL (ref 30.0–36.0)
MCV: 93.6 fL (ref 78.0–100.0)
MCV: 93.3 fL (ref 78.0–100.0)
MCV: 93.7 fL (ref 78.0–100.0)
Platelets: 139 10*3/uL — ABNORMAL LOW (ref 150–400)
Platelets: 158 10*3/uL (ref 150–400)
Platelets: 150 10*3/uL (ref 150–400)
RBC: 2.96 MIL/uL — ABNORMAL LOW (ref 4.22–5.81)
RBC: 3.14 MIL/uL — ABNORMAL LOW (ref 4.22–5.81)
RBC: 3.01 MIL/uL — ABNORMAL LOW (ref 4.22–5.81)
RDW: 14.1 % (ref 11.5–15.5)
RDW: 14.2 % (ref 11.5–15.5)
RDW: 13.9 % (ref 11.5–15.5)
WBC: 3.6 10*3/uL — ABNORMAL LOW (ref 4.0–10.5)
WBC: 4.7 10*3/uL (ref 4.0–10.5)
WBC: 4.6 10*3/uL (ref 4.0–10.5)

## 2017-09-24 LAB — BASIC METABOLIC PANEL
Anion gap: 8 (ref 5–15)
BUN: 21 mg/dL (ref 8–23)
CO2: 27 mmol/L (ref 22–32)
Calcium: 8.9 mg/dL (ref 8.9–10.3)
Chloride: 103 mmol/L (ref 98–111)
Creatinine, Ser: 1.59 mg/dL — ABNORMAL HIGH (ref 0.61–1.24)
GFR calc Af Amer: 46 mL/min — ABNORMAL LOW (ref >60)
GFR calc non Af Amer: 40 mL/min — ABNORMAL LOW (ref >60)
Glucose, Bld: 88 mg/dL (ref 70–99)
Potassium: 4.5 mmol/L (ref 3.5–5.1)
Sodium: 138 mmol/L (ref 135–145)

## 2017-09-24 LAB — PROTIME-INR
INR: 1.31
Prothrombin Time: 16.2 seconds — ABNORMAL HIGH (ref 11.4–15.2)

## 2017-09-24 MED ORDER — BISACODYL 5 MG PO TBEC
10.0000 mg | DELAYED_RELEASE_TABLET | Freq: Once | ORAL | Status: AC
  Administered 2017-09-24: 10 mg via ORAL

## 2017-09-24 MED ORDER — SODIUM CHLORIDE 0.9 % IV SOLN
INTRAVENOUS | Status: DC
  Administered 2017-09-25: via INTRAVENOUS

## 2017-09-24 MED ORDER — PEG 3350-KCL-NA BICARB-NACL 420 G PO SOLR
4000.0000 mL | Freq: Once | ORAL | Status: AC
  Administered 2017-09-24: 4000 mL via ORAL
  Filled 2017-09-24: qty 4000

## 2017-09-24 NOTE — Progress Notes (Signed)
PROGRESS NOTE  [name]  BSJ:[id number] DOB: [birth year] DOA: 09/21/2017 PCP: Dineen Kid, MD   Brief Narrative: [name] is a 82 y.o. male with a history of AFib on xarelto, CAD s/p PCI in June 2018, and sigmoid diverticulosis on colonoscopy 2016 and recent hematuria who presented to the ED with 2 weeks of GI bleeding, bloody BMs in setting of constipation. Hgb noted to be down to 9 from baseline of 11. He was admitted and GI consulted. Xarelto has been held and bleeding is ongoing though he remains hemodynamically stable so GI is planning observation, possible colonoscopy 7/29 if hgb drops further.  Assessment & Plan: Principal Problem:   Acute lower GI bleeding Active Problems:   Post PTCA   Atrial flutter (HCC)   Acute blood loss anemia   Acute kidney injury superimposed on CKD (HCC)  Acute lower GI bleeding with acute blood loss anemia: Followed by Dr. Watt Climes as outpatient, known diverticulosis - Diet per GI > full liquids. NPO p MN for colonoscopy tmrw AM. Prep at bedside.  - Recheck CBC q12h, slight downtrend still >8 without anemic symptoms but had red bleeding still last night. INR pending.   Hyperbilirubinemia: Mild, possible gilbert's.  - Fractionate bili - RUQ U/S benign, h/o cholecystectomy.   AFib:  - Rate is controlled, continue metoprolol - Holding xarelto as above. CHA2DS2-VASc is 4.   CAD s/p PCI, HTN: Seen by Dr. Virgina Jock, recently taken off aspirin, formerly taken off plavix following PCI in June 2018. EF 50-55% in Nov 2018. - Will continue to hold antiplatelets.  - Monitor for evidence of ACS, none currently.  - Continue metoprolol, ARB, statin, norvasc, triamterene, HCTZ.  AKI on stage III CKD: Baseline creatinine likely 1.5-1.6, up to 2.13 on admission. Likely prerenal, improved with fluids.  - Avoid nephrotoxins - Repeat BMP pending  Parkinson's disease: Dx late 2018, followed by Dr. Carles Collet. No worsening of symptoms noted. -  Continue sinemet  History of prostate CA Tx w/brachytherapy and recent hematuria:  - Follow up with urology.   DVT prophylaxis: SCDs Code Status: Full Family Communication: None at bedside this AM. Disposition Plan: Home once stable  Consultants:   [facility]  Procedures:   None  Antimicrobials:  None   Subjective: 4 BMs last night with red blood yesterday, none this morning. No abdominal pain, nausea, vomiting.   Objective: Vitals:   09/23/17 1422 09/23/17 2106 09/24/17 0515 09/24/17 0918  BP: 94/68 112/77 123/71   Pulse: 76 (!) 131 60 75  Resp: 18 16 20    Temp: 97.7 F (36.5 C) 98.2 F (36.8 C) 97.8 F (36.6 C)   TempSrc: Oral Oral Oral   SpO2: 98% 99% 98%   Weight:      Height:        Intake/Output Summary (Last 24 hours) at 09/24/2017 1001 Last data filed at 09/24/2017 0500 Gross per 24 hour  Intake -  Output 300 ml  Net -300 ml   Filed Weights   09/21/17 0913 09/21/17 1306  Weight: 95.3 kg (210 lb) 94.2 kg (207 lb 10.8 oz)   Gen: Pleasant, alert, elderly male in no distress Pulm: Nonlabored breathing room air. Clear. CV: Regular rate and rhythm. No murmur, rub, or gallop. No JVD, no dependent edema. GI: Abdomen soft, non-tender, non-distended, with normoactive bowel sounds.  Ext: Warm, no deformities Skin: No rashes, lesions or ulcers on visualized skin.  Neuro: Alert and oriented. No focal neurological deficits. Psych: Judgement and insight appear  fair. Mood euthymic & affect congruent. Behavior is appropriate.    Data Reviewed: I have personally reviewed following labs and imaging studies  CBC: Recent Labs  Lab 09/21/17 0931 09/22/17 0409 09/23/17 0312 09/24/17 0643  WBC 4.5 3.7* 3.6* 3.6*  NEUTROABS 3.1  --   --   --   HGB 9.7* 8.8* 8.6* 8.4*  HCT 31.5* 28.1* 27.7* 27.7*  MCV 91.8 92.1 92.6 93.6  PLT 151 135* 133* 300*   Basic Metabolic Panel: Recent Labs  Lab 09/21/17 0931 09/22/17 0409 09/23/17 0312  NA 138 138 137  K 3.8 4.1  4.5  CL 99 100 102  CO2 29 29 26   GLUCOSE 97 97 95  BUN 32* 28* 23  CREATININE 2.13* 1.85* 1.65*  CALCIUM 9.3 8.9 8.8*   GFR: Estimated Creatinine Clearance: 42.6 mL/min (A) (by C-G formula based on SCr of 1.65 mg/dL (H)). Liver Function Tests: Recent Labs  Lab 09/21/17 0931 09/23/17 0312  AST 24 22  ALT 5 7  ALKPHOS 101 83  BILITOT 2.3* 2.1*  PROT 6.9 5.7*  ALBUMIN 4.0 3.1*   Coagulation Profile: Recent Labs  Lab 09/21/17 0931  INR 1.87   Radiology Studies: US Abdomen Limited Ruq  Result Date: 09/24/2017 CLINICAL DATA:  Hyperbilirubinemia. EXAM: ULTRASOUND ABDOMEN LIMITED RIGHT UPPER QUADRANT COMPARISON:  Abdominal CT 09/01/2017 FINDINGS: Gallbladder: Surgically absent. Common bile duct: Diameter: 4 mm were visualized. Liver: Technically limited evaluation given elevated right hemidiaphragm and high position of the liver in the abdomen. No focal lesion identified. Within normal limits in parenchymal echogenicity. Portal vein is patent on color Doppler imaging with normal direction of blood flow towards the liver. IMPRESSION: 1. Postcholecystectomy without biliary dilatation. 2. Limited assessment of the liver demonstrates no sonographic abnormality or explanation for hyperbilirubinemia. Sonographic liver evaluation is limited given high positioning under elevated hemidiaphragm. Electronically Signed   By: Jeb Levering M.D.   On: 09/24/2017 04:23    Scheduled Meds: . amLODipine  5 mg Oral Daily  . bisacodyl  10 mg Oral Once  . carbidopa-levodopa  1 tablet Oral TID  . cholecalciferol  1,000 Units Oral Daily  . losartan  25 mg Oral QPM  . metoprolol succinate  100 mg Oral q morning - 10a  . pantoprazole  40 mg Oral Daily  . polyethylene glycol  17 g Oral BID  . rosuvastatin  10 mg Oral QHS  . tamsulosin  0.4 mg Oral QHS  . triamterene-hydrochlorothiazide  1 tablet Oral Daily   Continuous Infusions:    LOS: 2 days   Time spent: 25 minutes.  Patrecia Pour,  MD [facility] [email or web address] Password [email or web address] 09/24/2017, 10:01 AM

## 2017-09-24 NOTE — Progress Notes (Addendum)
[facility] PROGRESS NOTE Subjective Patient reports some bleeding.  Hemoglobin this morning still pending.  Objective: Vital signs in last 24 hours: Temp:  [97.7 F (36.5 C)-98.2 F (36.8 C)] 98.2 F (36.8 C) (07/27 2106) Pulse Rate:  [76-131] 131 (07/27 2106) Resp:  [16-18] 16 (07/27 2106) BP: (94-112)/(68-77) 112/77 (07/27 2106) SpO2:  [98 %-99 %] 99 % (07/27 2106) Last BM Date: 09/23/17  Intake/Output from previous day: 07/27 0701 - 07/28 0700 In: -  Out: 200 [Urine:200] Intake/Output this shift: No intake/output data recorded.  PE: General--no acute distress  Abdomen--soft and nontender  Lab Results: Recent Labs    09/21/17 0931 09/22/17 0409 09/23/17 0312  WBC 4.5 3.7* 3.6*  HGB 9.7* 8.8* 8.6*  HCT 31.5* 28.1* 27.7*  PLT 151 135* 133*   BMET Recent Labs    09/21/17 0931 09/22/17 0409 09/23/17 0312  NA 138 138 137  K 3.8 4.1 4.5  CL 99 100 102  CO2 29 29 26   CREATININE 2.13* 1.85* 1.65*   LFT Recent Labs    09/21/17 0931 09/23/17 0312  PROT 6.9 5.7*  AST 24 22  ALT 5 7  ALKPHOS 101 83  BILITOT 2.3* 2.1*   PT/INR Recent Labs    09/21/17 0931  LABPROT 21.4*  INR 1.87   PANCREAS No results for input(s): LIPASE in the last 72 hours.       Studies/Results: US Abdomen Limited Ruq  Result Date: 09/24/2017 CLINICAL DATA:  Hyperbilirubinemia. EXAM: ULTRASOUND ABDOMEN LIMITED RIGHT UPPER QUADRANT COMPARISON:  Abdominal CT 09/01/2017 FINDINGS: Gallbladder: Surgically absent. Common bile duct: Diameter: 4 mm were visualized. Liver: Technically limited evaluation given elevated right hemidiaphragm and high position of the liver in the abdomen. No focal lesion identified. Within normal limits in parenchymal echogenicity. Portal vein is patent on color Doppler imaging with normal direction of blood flow towards the liver. IMPRESSION: 1. Postcholecystectomy without biliary dilatation. 2. Limited assessment of the liver demonstrates no  sonographic abnormality or explanation for hyperbilirubinemia. Sonographic liver evaluation is limited given high positioning under elevated hemidiaphragm. Electronically Signed   By: Jeb Levering M.D.   On: 09/24/2017 04:23    Medications: I have reviewed the patient's current medications.  Assessment:   1.  Lower GI bleed.Patient basically stable has been off of Xarelto for some time should be okay for colonoscopy.  Does have a history of serrated adenomas.  We will plan on colonoscopy tomorrow. 2.  Elevated bilirubin.  This is been elevated for some time in the face of normal liver test probably Gilbert's syndrome   Plan: 1.  We will plan colonoscopy tomorrow.  Time to be determined.  Have discussed procedure possible polypectomy with patient 2.  We will fractionate bilirubin in a.m. to document [place] 09/24/2017, 5:09 AM  This note was created using voice recognition software. Minor errors may Have occurred unintentionally.  Pager: [phone] If no answer or after hours call [phone]   No one in the lab apparently knows how to order a fractionated bilirubin I have given them the patient's name and they will check with someone higher up to see if this can be done in the hospital.  If they can figure out how to do this we will have this lab drawn tomorrow at 5:00 with routine labs if not this should be done as an outpatient.

## 2017-09-24 NOTE — H&P (View-Only) (Signed)
[facility] PROGRESS NOTE Subjective Patient reports some bleeding.  Hemoglobin this morning still pending.  Objective: Vital signs in last 24 hours: Temp:  [97.7 F (36.5 C)-98.2 F (36.8 C)] 98.2 F (36.8 C) (07/27 2106) Pulse Rate:  [76-131] 131 (07/27 2106) Resp:  [16-18] 16 (07/27 2106) BP: (94-112)/(68-77) 112/77 (07/27 2106) SpO2:  [98 %-99 %] 99 % (07/27 2106) Last BM Date: 09/23/17  Intake/Output from previous day: 07/27 0701 - 07/28 0700 In: -  Out: 200 [Urine:200] Intake/Output this shift: No intake/output data recorded.  PE: General--no acute distress  Abdomen--soft and nontender  Lab Results: Recent Labs    09/21/17 0931 09/22/17 0409 09/23/17 0312  WBC 4.5 3.7* 3.6*  HGB 9.7* 8.8* 8.6*  HCT 31.5* 28.1* 27.7*  PLT 151 135* 133*   BMET Recent Labs    09/21/17 0931 09/22/17 0409 09/23/17 0312  NA 138 138 137  K 3.8 4.1 4.5  CL 99 100 102  CO2 29 29 26   CREATININE 2.13* 1.85* 1.65*   LFT Recent Labs    09/21/17 0931 09/23/17 0312  PROT 6.9 5.7*  AST 24 22  ALT 5 7  ALKPHOS 101 83  BILITOT 2.3* 2.1*   PT/INR Recent Labs    09/21/17 0931  LABPROT 21.4*  INR 1.87   PANCREAS No results for input(s): LIPASE in the last 72 hours.       Studies/Results: US Abdomen Limited Ruq  Result Date: 09/24/2017 CLINICAL DATA:  Hyperbilirubinemia. EXAM: ULTRASOUND ABDOMEN LIMITED RIGHT UPPER QUADRANT COMPARISON:  Abdominal CT 09/01/2017 FINDINGS: Gallbladder: Surgically absent. Common bile duct: Diameter: 4 mm were visualized. Liver: Technically limited evaluation given elevated right hemidiaphragm and high position of the liver in the abdomen. No focal lesion identified. Within normal limits in parenchymal echogenicity. Portal vein is patent on color Doppler imaging with normal direction of blood flow towards the liver. IMPRESSION: 1. Postcholecystectomy without biliary dilatation. 2. Limited assessment of the liver demonstrates no  sonographic abnormality or explanation for hyperbilirubinemia. Sonographic liver evaluation is limited given high positioning under elevated hemidiaphragm. Electronically Signed   By: Jeb Levering M.D.   On: 09/24/2017 04:23    Medications: I have reviewed the patient's current medications.  Assessment:   1.  Lower GI bleed.Patient basically stable has been off of Xarelto for some time should be okay for colonoscopy.  Does have a history of serrated adenomas.  We will plan on colonoscopy tomorrow. 2.  Elevated bilirubin.  This is been elevated for some time in the face of normal liver test probably Gilbert's syndrome   Plan: 1.  We will plan colonoscopy tomorrow.  Time to be determined.  Have discussed procedure possible polypectomy with patient 2.  We will fractionate bilirubin in a.m. to document [place] 09/24/2017, 5:09 AM  This note was created using voice recognition software. Minor errors may Have occurred unintentionally.  Pager: [phone] If no answer or after hours call [phone]   No one in the lab apparently knows how to order a fractionated bilirubin I have given them the patient's name and they will check with someone higher up to see if this can be done in the hospital.  If they can figure out how to do this we will have this lab drawn tomorrow at 5:00 with routine labs if not this should be done as an outpatient.

## 2017-09-24 NOTE — Progress Notes (Signed)
Pt stood to transfer, had puddle of bright red blood on sheet on the bed. Hgb 8.4 for 0600 labs. Notified Dr. Bonner Puna at 1017, CBC to be drawn later today.

## 2017-09-25 ENCOUNTER — Encounter (HOSPITAL_COMMUNITY)
Admission: EM | Disposition: A | Discharge status: Home / Self Care | Payer: Self-pay | Source: Home / Self Care | Attending: Family Medicine

## 2017-09-25 ENCOUNTER — Inpatient Hospital Stay (HOSPITAL_COMMUNITY): Payer: Medicare Other | Admitting: Certified Registered Nurse Anesthetist

## 2017-09-25 ENCOUNTER — Encounter (HOSPITAL_COMMUNITY): Payer: Self-pay

## 2017-09-25 DIAGNOSIS — K648 Other hemorrhoids: Secondary | ICD-10-CM

## 2017-09-25 HISTORY — PX: COLONOSCOPY WITH PROPOFOL: SHX5780

## 2017-09-25 LAB — CBC
HCT: 26.2 % — ABNORMAL LOW (ref 39.0–52.0)
Hemoglobin: 8 g/dL — ABNORMAL LOW (ref 13.0–17.0)
MCH: 28.3 pg (ref 26.0–34.0)
MCHC: 30.5 g/dL (ref 30.0–36.0)
MCV: 92.6 fL (ref 78.0–100.0)
Platelets: 126 10*3/uL — ABNORMAL LOW (ref 150–400)
RBC: 2.83 MIL/uL — ABNORMAL LOW (ref 4.22–5.81)
RDW: 13.8 % (ref 11.5–15.5)
WBC: 3.5 10*3/uL — ABNORMAL LOW (ref 4.0–10.5)

## 2017-09-25 LAB — BILIRUBIN, FRACTIONATED(TOT/DIR/INDIR)
Bilirubin, Direct: 0.3 mg/dL — ABNORMAL HIGH (ref 0.0–0.2)
Indirect Bilirubin: 1.9 mg/dL — ABNORMAL HIGH (ref 0.3–0.9)
Total Bilirubin: 2.2 mg/dL — ABNORMAL HIGH (ref 0.3–1.2)

## 2017-09-25 SURGERY — COLONOSCOPY WITH PROPOFOL
Anesthesia: Monitor Anesthesia Care

## 2017-09-25 MED ORDER — PROPOFOL 500 MG/50ML IV EMUL
INTRAVENOUS | Status: DC | PRN
  Administered 2017-09-25: 100 ug/kg/min via INTRAVENOUS
  Administered 2017-09-25: 75 ug/kg/min via INTRAVENOUS

## 2017-09-25 MED ORDER — PHENYLEPHRINE 40 MCG/ML (10ML) SYRINGE FOR IV PUSH (FOR BLOOD PRESSURE SUPPORT)
PREFILLED_SYRINGE | INTRAVENOUS | Status: DC | PRN
  Administered 2017-09-25 (×2): 80 ug via INTRAVENOUS
  Administered 2017-09-25: 40 ug via INTRAVENOUS
  Administered 2017-09-25: 120 ug via INTRAVENOUS
  Administered 2017-09-25: 40 ug via INTRAVENOUS
  Administered 2017-09-25: 160 ug via INTRAVENOUS

## 2017-09-25 MED ORDER — PROPOFOL 10 MG/ML IV BOLUS
INTRAVENOUS | Status: DC | PRN
  Administered 2017-09-25: 40 mg via INTRAVENOUS
  Administered 2017-09-25: 10 mg via INTRAVENOUS
  Administered 2017-09-25: 20 mg via INTRAVENOUS

## 2017-09-25 MED ORDER — LIDOCAINE 2% (20 MG/ML) 5 ML SYRINGE
INTRAMUSCULAR | Status: DC | PRN
  Administered 2017-09-25: 40 mg via INTRAVENOUS

## 2017-09-25 SURGICAL SUPPLY — 22 items

## 2017-09-25 NOTE — Anesthesia Procedure Notes (Signed)
Procedure Name: MAC Date/Time: 09/25/2017 11:39 AM Performed by: Leonor Liv, CRNA Pre-anesthesia Checklist: Patient identified, Emergency Drugs available, Suction available, Patient being monitored and Timeout performed Patient Re-evaluated:Patient Re-evaluated prior to induction Oxygen Delivery Method: Simple face mask Placement Confirmation: positive ETCO2

## 2017-09-25 NOTE — Progress Notes (Addendum)
PROGRESS NOTE  Adonai Selsor  CXK:[id number] DOB: 1935/09/27 DOA: 09/21/2017 PCP: Dineen Kid, MD   Brief Narrative: Masao Junker is a 82 y.o. male with a history of AFib on xarelto, CAD s/p PCI in June 2018, and sigmoid diverticulosis on colonoscopy 2016 and recent hematuria who presented to the ED with 2 weeks of GI bleeding, bloody BMs in setting of constipation. Hgb noted to be down to 9 from baseline of 11. He was admitted and GI consulted. Xarelto has been held and bleeding is ongoing. GI performed colonoscopy 7/29 showing bleeding internal hemorrhoids, otherwise normal mucosa. General surgery has been consulted per GI recommendations. Hgb has trended slowly downward. .  Assessment & Plan: Principal Problem:   Acute lower GI bleeding Active Problems:   Post PTCA   Atrial flutter (HCC)   Acute blood loss anemia   Acute kidney injury superimposed on CKD (HCC)  Acute lower GI bleeding with acute blood loss anemia: Followed by Dr. Watt Climes as outpatient, previously reported diverticulosis. Colonoscopy 7/29 showed normal mucosa, an isolated right sided diverticulum and bleeding internal hemorrhoids.  - Clear liquids per GI - General/colorectal surgery consulted.  - Recheck CBC in AM.  - Continue to hold anticoagulation  Persistent AFib:  - Rate is controlled, continue metoprolol - Holding xarelto as above. CHA2DS2-VASc is 4.   CAD s/p PCI, HTN: Seen by Dr. Virgina Jock, recently taken off aspirin, formerly taken off plavix following PCI in June 2018. EF 50-55% in Nov 2018. - Will continue to hold antiplatelets.  - Monitor for evidence of ACS, none currently.  - Continue metoprolol, ARB, statin, norvasc, triamterene, HCTZ.  AKI on stage III CKD: Baseline creatinine likely 1.5-1.6, up to 2.13 on admission. Likely prerenal, improved with fluids.  - Avoid nephrotoxins  Parkinson's disease: Dx late 2018, followed by Dr. Carles Collet. No worsening of symptoms noted. - Continue  sinemet  History of prostate CA Tx w/brachytherapy and recent hematuria:  - Follow up with urology.   Indirect hyperbilirubinemia: Mild, not felt to be due to hemolysis. Possible gilbert's. RUQ U/S benign, h/o cholecystectomy. - Monitor  DVT prophylaxis: SCDs Code Status: Full Family Communication: None at bedside this AM. Disposition Plan: Home once work up complete and patient stable.  Consultants:   [facility]  Procedures:   Colonoscopy 09/25/2017 by Dr. Oletta Lamas:  Findings:      The perianal exam findings include bright red blood on the examining       finger.      Normal mucosa was found in the sigmoid colon, in the descending colon       and in the right colon. there was no bleeding or clots in this part of       the colon. The prep was not adequate to rule out small polyps but no       gross masses were saying.      Red blood was found in the rectum. the rectum was vigorously irrigated       no active bleeding was seen except in the retroflex view right around       the anal area where he had internal hemorrhoids. Impression:       - Preparation of the colon was inadequate.                           - Bright red blood on the examining finger found on  perianal exam. this is consistent with bleeding                            internal hemorrhoids                           - Normal mucosa in the sigmoid colon, in the                            descending colon and in the right colon.                           - Blood in the rectum.                           - No specimens collected. Recommendation: Clear liquid diet.                           - Refer to a colo-rectal surgeon at appointment to                            be scheduled.                           - Repeat colonoscopy for surveillance. this patient                            has had polyps removed in the prep for this                            colonoscopy was not adequate to rule out  polyps    Antimicrobials:  None   Subjective: Still bleeding during prep this morning and last night. No chest pain, dyspnea, dizziness.   Objective: Vitals:   09/25/17 0359 09/25/17 1110 09/25/17 1231 09/25/17 1240  BP: 127/76 (!) 153/80 (!) 108/54 116/72  Pulse: 66 83 (!) 58 70  Resp: 16 16 10 12   Temp: 98 F (36.7 C) 97.9 F (36.6 C) 97.8 F (36.6 C)   TempSrc: Oral Oral Oral   SpO2: 97% 98% 100% 99%  Weight:      Height:        Intake/Output Summary (Last 24 hours) at 09/25/2017 1246 Last data filed at 09/25/2017 1228 Gross per 24 hour  Intake 431.89 ml  Output 275 ml  Net 156.89 ml   Filed Weights   09/21/17 0913 09/21/17 1306  Weight: 95.3 kg (210 lb) 94.2 kg (207 lb 10.8 oz)   Gen: Pleasant elderly male in no distress Pulm: Nonlabored breathing room air. Clear. CV: Regular rate and rhythm. No murmur, rub, or gallop. No JVD, no dependent edema. GI: Abdomen soft, non-tender, non-distended, with normoactive bowel sounds.  Ext: Warm, no deformities Skin: No rashes, lesions or ulcers on visualized skin.  Neuro: Alert and oriented. No focal neurological deficits. Psych: Judgement and insight appear fair. Mood euthymic & affect congruent. Behavior is appropriate.    Data Reviewed: I have personally reviewed following labs and imaging studies  CBC: Recent Labs  Lab 09/21/17 0931  09/23/17 1610 09/24/17 9604 09/24/17 1210 09/24/17 5409 09/25/17 8119  WBC 4.5   < > 3.6* 3.6* 4.7 4.6 3.5*  NEUTROABS 3.1  --   --   --   --   --   --   HGB 9.7*   < > 8.6* 8.4* 8.9* 8.5* 8.0*  HCT 31.5*   < > 27.7* 27.7* 29.3* 28.2* 26.2*  MCV 91.8   < > 92.6 93.6 93.3 93.7 92.6  PLT 151   < > 133* 139* 158 150 126*   < > = values in this interval not displayed.   Basic Metabolic Panel: Recent Labs  Lab 09/21/17 0931 09/22/17 0409 09/23/17 0312 09/24/17 0643  NA 138 138 137 138  K 3.8 4.1 4.5 4.5  CL 99 100 102 103  CO2 29 29 26 27   GLUCOSE 97 97 95 88  BUN 32* 28*  23 21  CREATININE 2.13* 1.85* 1.65* 1.59*  CALCIUM 9.3 8.9 8.8* 8.9   GFR: Estimated Creatinine Clearance: 44.2 mL/min (A) (by C-G formula based on SCr of 1.59 mg/dL (H)). Liver Function Tests: Recent Labs  Lab 09/21/17 0931 09/23/17 0312 09/25/17 0524  AST 24 22  --   ALT 5 7  --   ALKPHOS 101 83  --   BILITOT 2.3* 2.1* 2.2*  PROT 6.9 5.7*  --   ALBUMIN 4.0 3.1*  --    Coagulation Profile: Recent Labs  Lab 09/21/17 0931 09/24/17 0643  INR 1.87 1.31   Radiology Studies: US Abdomen Limited Ruq  Result Date: 09/24/2017 CLINICAL DATA:  Hyperbilirubinemia. EXAM: ULTRASOUND ABDOMEN LIMITED RIGHT UPPER QUADRANT COMPARISON:  Abdominal CT 09/01/2017 FINDINGS: Gallbladder: Surgically absent. Common bile duct: Diameter: 4 mm were visualized. Liver: Technically limited evaluation given elevated right hemidiaphragm and high position of the liver in the abdomen. No focal lesion identified. Within normal limits in parenchymal echogenicity. Portal vein is patent on color Doppler imaging with normal direction of blood flow towards the liver. IMPRESSION: 1. Postcholecystectomy without biliary dilatation. 2. Limited assessment of the liver demonstrates no sonographic abnormality or explanation for hyperbilirubinemia. Sonographic liver evaluation is limited given high positioning under elevated hemidiaphragm. Electronically Signed   By: Jeb Levering M.D.   On: 09/24/2017 04:23    Scheduled Meds: . [MAR Hold] amLODipine  5 mg Oral Daily  . [MAR Hold] carbidopa-levodopa  1 tablet Oral TID  . [MAR Hold] cholecalciferol  1,000 Units Oral Daily  . [MAR Hold] losartan  25 mg Oral QPM  . [MAR Hold] metoprolol succinate  100 mg Oral q morning - 10a  . [MAR Hold] pantoprazole  40 mg Oral Daily  . [MAR Hold] polyethylene glycol  17 g Oral BID  . [MAR Hold] rosuvastatin  10 mg Oral QHS  . [MAR Hold] tamsulosin  0.4 mg Oral QHS  . [MAR Hold] triamterene-hydrochlorothiazide  1 tablet Oral Daily    Continuous Infusions: . sodium chloride 20 mL/hr at 09/25/17 0844     LOS: 3 days   Time spent: 25 minutes.  Patrecia Pour, MD [facility] [email or web address] Password [facility] 09/25/2017, 12:46 PM

## 2017-09-25 NOTE — Care Management Important Message (Signed)
Important Message  Patient Details  Name: [name] MRN: [id number] Date of Birth: 1935-09-24   Medicare Important Message Given:  Yes    [name] 09/25/2017, 3:29 PM

## 2017-09-25 NOTE — Anesthesia Postprocedure Evaluation (Signed)
Anesthesia Post Note  Patient: [name]  Procedure(s) Performed: COLONOSCOPY WITH PROPOFOL (N/A )     Patient location during evaluation: PACU Anesthesia Type: MAC Level of consciousness: awake and alert Pain management: pain level controlled Vital Signs Assessment: post-procedure vital signs reviewed and stable Respiratory status: spontaneous breathing, nonlabored ventilation, respiratory function stable and patient connected to nasal cannula oxygen Cardiovascular status: stable and blood pressure returned to baseline Postop Assessment: no apparent nausea or vomiting Anesthetic complications: no    Last Vitals:  Vitals:   09/25/17 1240 09/25/17 1250  BP: 116/72 128/64  Pulse: 70   Resp: 12   Temp:    SpO2: 99%     Last Pain:  Vitals:   09/25/17 1250  TempSrc:   PainSc: 0-No pain                 Effie Berkshire

## 2017-09-25 NOTE — Progress Notes (Signed)
Patient seen at the request of Dr. Bonner Puna for bleeding internal hemorrhoids. Colonoscopy today by Dr. Oletta Lamas negative for malignancy, no significant bleeding. Will discuss with Dr. Brantley Stage and write formal consult note tomorrow. Persistent bleeding may require surgical intervention this admission vs discharge with outpatient follow up with one of our colorectal specialists.  Obie Dredge, PA-C [facility] Pager: [phone]

## 2017-09-25 NOTE — Op Note (Signed)
[facility] Patient Name: [name] Procedure Date : 09/25/2017 MRN: [id number] Attending MD: [name] Dr., MD Date of Birth: 1935/07/18 CSN: [id number] Age: 82 Admit Type: Inpatient Procedure:                Colonoscopy Indications:              Hematochezia has had previous history of polyps                            removed several years ago. Was on Xarelto but even                            holding the Xarelto has had persistent bright red                            blood Providers:                Jeneen Rinks L. Zacary Bauer Dr., MD, Cleda Daub, RN, Tinnie Gens, Technician, Trixie Deis, CRNA Referring MD:              Medicines:                Monitored Anesthesia Care Complications:            No immediate complications. Estimated Blood Loss:     Estimated blood loss was minimal. Procedure:                Pre-Anesthesia Assessment:                           - Prior to the procedure, a History and Physical                            was performed, and patient medications and                            allergies were reviewed. The patient's tolerance of                            previous anesthesia was also reviewed. The risks                            and benefits of the procedure and the sedation                            options and risks were discussed with the patient.                            All questions were answered, and informed consent                            was obtained. Prior Anticoagulants: The patient has  taken Xarelto (rivaroxaban), last dose was 3 days                            prior to procedure. ASA Grade Assessment: III - A                            patient with severe systemic disease. After                            reviewing the risks and benefits, the patient was                            deemed in satisfactory condition to undergo the   procedure.                           After obtaining informed consent, the colonoscope                            was passed under direct vision. Throughout the                            procedure, the patient's blood pressure, pulse, and                            oxygen saturations were monitored continuously. The                            [id number]-[id number] ([id number]) peds colon was introduced                            through the anus and advanced to the the cecum,                            identified by the ileocecal valve. The colonoscopy                            was somewhat difficult due to inadequate bowel                            prep. The patient tolerated the procedure well. The                            quality of the bowel preparation was not adequate                            to identify polyps 6 mm and larger in size. Scope In: 11:50:42 AM Scope Out: 12:20:52 PM Scope Withdrawal Time: 0 hours 20 minutes 4 seconds  Total Procedure Duration: 0 hours 30 minutes 10 seconds  Findings:      The perianal exam findings include bright red blood on the examining       finger.      Normal mucosa was found in the sigmoid colon, in the descending colon  and in the right colon. there was no bleeding or clots in this part of       the colon. The prep was not adequate to rule out small polyps but no       gross masses were saying.      Red blood was found in the rectum. the rectum was vigorously irrigated       no active bleeding was seen except in the retroflex view right around       the anal area where he had internal hemorrhoids. Impression:               - Preparation of the colon was inadequate.                           - Bright red blood on the examining finger found on                            perianal exam. this is consistent with bleeding                            internal hemorrhoids                           - Normal mucosa in the sigmoid colon, in the                             descending colon and in the right colon.                           - Blood in the rectum.                           - No specimens collected. Recommendation:           - Clear liquid diet.                           - Refer to a colo-rectal surgeon at appointment to                            be scheduled.                           - Repeat colonoscopy for surveillance. this patient                            has had polyps removed in the prep for this                            colonoscopy was not adequate to rule out polyps Procedure Code(s):        --- Professional ---                           [phone], Colonoscopy, flexible; diagnostic, including                            collection  of specimen(s) by brushing or washing,                            when performed (separate procedure) Diagnosis Code(s):        --- Professional ---                           K62.5, Hemorrhage of anus and rectum CPT copyright 2017 American Medical Association. All rights reserved. The codes documented in this report are preliminary and upon coder review may  be revised to meet current compliance requirements. [name] Dr., MD 09/25/2017 12:42:09 PM This report has been signed electronically. Number of Addenda: 0

## 2017-09-25 NOTE — Progress Notes (Signed)
Patient kept NPO after MN for Colonoscopy today. Patient signed informed consent. No acute distress noted. Repeat Hbg=8.5 Will continue to monitor.

## 2017-09-25 NOTE — Progress Notes (Signed)
EGD shows DU deep and hard biopsies taken possible that it is not benign. Continue IV Protonix BID and would consider Lovenox bridging all in the hospital rather than resuming oral anticoagulant now

## 2017-09-25 NOTE — Transfer of Care (Signed)
Immediate Anesthesia Transfer of Care Note  Patient: [name]  Procedure(s) Performed: COLONOSCOPY WITH PROPOFOL (N/A )  Patient Location: Endoscopy Unit  Anesthesia Type:MAC  Level of Consciousness: drowsy  Airway & Oxygen Therapy: Patient Spontanous Breathing and Patient connected to nasal cannula oxygen  Post-op Assessment: Report given to RN, Post -op Vital signs reviewed and stable and Patient moving all extremities  Post vital signs: Reviewed and stable  Last Vitals:  Vitals Value Taken Time  BP 108/54 09/25/2017 12:30 PM  Temp    Pulse 58 09/25/2017 12:30 PM  Resp 10 09/25/2017 12:30 PM  SpO2 100 % 09/25/2017 12:30 PM  Vitals shown include unvalidated device data.  Last Pain:  Vitals:   09/25/17 1110  TempSrc: Oral  PainSc: 0-No pain      Patients Stated Pain Goal: 0 (14/44/58 4835)  Complications: No apparent anesthesia complications

## 2017-09-25 NOTE — Anesthesia Preprocedure Evaluation (Addendum)
Anesthesia Evaluation  Patient identified by MRN, date of birth, ID band Patient awake    Reviewed: Allergy & Precautions, NPO status , Patient's Chart, lab work & pertinent test results  Airway Mallampati: II  TM Distance: >3 FB Neck ROM: Full    Dental  (+) Dental Advisory Given, Teeth Intact   Pulmonary neg pulmonary ROS,    breath sounds clear to auscultation       Cardiovascular hypertension, Pt. on home beta blockers and Pt. on medications + CAD  + dysrhythmias Atrial Fibrillation + Valvular Problems/Murmurs AS  Rhythm:Regular Rate:Normal     Neuro/Psych negative neurological ROS     GI/Hepatic negative GI ROS, Neg liver ROS,   Endo/Other    Renal/GU Renal disease     Musculoskeletal  (+) Arthritis , Osteoarthritis,    Abdominal Normal abdominal exam  (+)   Peds  Hematology   Anesthesia Other Findings - Parkinson's Disease  Reproductive/Obstetrics                           Lab Results  Component Value Date   WBC 3.5 (L) 09/25/2017   HGB 8.0 (L) 09/25/2017   HCT 26.2 (L) 09/25/2017   MCV 92.6 09/25/2017   PLT 126 (L) 09/25/2017   Lab Results  Component Value Date   INR 1.31 09/24/2017   INR 1.87 09/21/2017   INR 1.01 06/18/2009   Echo: - Left ventricle: The cavity size was normal. The estimated   ejection fraction was in the range of 50% to 55%. - Aortic valve: There was mild stenosis. There was mild   regurgitation. - Mitral valve: There was mild regurgitation. - Left atrium: The atrium was mildly dilated. No evidence of   thrombus in the atrial cavity or appendage.   Anesthesia Physical Anesthesia Plan  ASA: III  Anesthesia Plan: MAC   Post-op Pain Management:    Induction: Intravenous  PONV Risk Score and Plan: 0 and Propofol infusion  Airway Management Planned: Nasal Cannula  Additional Equipment: None  Intra-op Plan:   Post-operative Plan:    Informed Consent: I have reviewed the patients History and Physical, chart, labs and discussed the procedure including the risks, benefits and alternatives for the proposed anesthesia with the patient or authorized representative who has indicated his/her understanding and acceptance.     Plan Discussed with: CRNA  Anesthesia Plan Comments:        Anesthesia Quick Evaluation

## 2017-09-25 NOTE — Interval H&P Note (Signed)
History and Physical Interval Note:  09/25/2017 11:35 AM  [name]  has presented today for surgery, with the diagnosis of LGI bleed  The various methods of treatment have been discussed with the patient and family. After consideration of risks, benefits and other options for treatment, the patient has consented to  Procedure(s): COLONOSCOPY WITH PROPOFOL (N/A) as a surgical intervention .  The patient's history has been reviewed, patient examined, no change in status, stable for surgery.  I have reviewed the patient's chart and labs.  Questions were answered to the patient's satisfaction.     [name]

## 2017-09-26 ENCOUNTER — Encounter (HOSPITAL_COMMUNITY): Payer: Self-pay | Admitting: Gastroenterology

## 2017-09-26 DIAGNOSIS — I4891 Unspecified atrial fibrillation: Secondary | ICD-10-CM

## 2017-09-26 DIAGNOSIS — N183 Chronic kidney disease, stage 3 unspecified: Secondary | ICD-10-CM

## 2017-09-26 DIAGNOSIS — Z7901 Long term (current) use of anticoagulants: Secondary | ICD-10-CM

## 2017-09-26 LAB — BASIC METABOLIC PANEL
Anion gap: 5 (ref 5–15)
BUN: 15 mg/dL (ref 8–23)
CO2: 27 mmol/L (ref 22–32)
Calcium: 8.8 mg/dL — ABNORMAL LOW (ref 8.9–10.3)
Chloride: 104 mmol/L (ref 98–111)
Creatinine, Ser: 1.5 mg/dL — ABNORMAL HIGH (ref 0.61–1.24)
GFR calc Af Amer: 49 mL/min — ABNORMAL LOW (ref >60)
GFR calc non Af Amer: 43 mL/min — ABNORMAL LOW (ref >60)
Glucose, Bld: 88 mg/dL (ref 70–99)
Potassium: 4 mmol/L (ref 3.5–5.1)
Sodium: 136 mmol/L (ref 135–145)

## 2017-09-26 LAB — CBC
HCT: 25.7 % — ABNORMAL LOW (ref 39.0–52.0)
Hemoglobin: 8 g/dL — ABNORMAL LOW (ref 13.0–17.0)
MCH: 28.3 pg (ref 26.0–34.0)
MCHC: 31.1 g/dL (ref 30.0–36.0)
MCV: 90.8 fL (ref 78.0–100.0)
Platelets: 133 10*3/uL — ABNORMAL LOW (ref 150–400)
RBC: 2.83 MIL/uL — ABNORMAL LOW (ref 4.22–5.81)
RDW: 13.8 % (ref 11.5–15.5)
WBC: 3.4 10*3/uL — ABNORMAL LOW (ref 4.0–10.5)

## 2017-09-26 LAB — TYPE AND SCREEN
ABO/RH(D): B POS
Antibody Screen: NEGATIVE

## 2017-09-26 MED ORDER — CEFAZOLIN SODIUM-DEXTROSE 2-4 GM/100ML-% IV SOLN
2.0000 g | INTRAVENOUS | Status: AC
  Filled 2017-09-26: qty 100

## 2017-09-26 MED ORDER — BUPIVACAINE LIPOSOME 1.3 % IJ SUSP
20.0000 mL | Freq: Once | INTRAMUSCULAR | Status: DC
  Filled 2017-09-26 (×2): qty 20

## 2017-09-26 MED ORDER — CARBIDOPA-LEVODOPA 25-100 MG PO TABS
1.0000 | ORAL_TABLET | Freq: Every day | ORAL | Status: DC
  Administered 2017-09-26 – 2017-09-28 (×3): 1 via ORAL
  Filled 2017-09-26 (×3): qty 1

## 2017-09-26 MED ORDER — CARBIDOPA-LEVODOPA 25-100 MG PO TABS
2.0000 | ORAL_TABLET | ORAL | Status: DC
  Administered 2017-09-27 – 2017-09-29 (×4): 2 via ORAL
  Filled 2017-09-26 (×4): qty 2

## 2017-09-26 MED ORDER — GABAPENTIN 300 MG PO CAPS
300.0000 mg | ORAL_CAPSULE | ORAL | Status: AC
  Administered 2017-09-26: 300 mg via ORAL
  Filled 2017-09-26: qty 1

## 2017-09-26 MED ORDER — ACETAMINOPHEN 500 MG PO TABS
1000.0000 mg | ORAL_TABLET | ORAL | Status: AC
  Administered 2017-09-26: 1000 mg via ORAL
  Filled 2017-09-26: qty 2

## 2017-09-26 MED ORDER — METRONIDAZOLE IN NACL 5-0.79 MG/ML-% IV SOLN: 500.0000 mg | INTRAVENOUS | Status: AC

## 2017-09-26 MED ORDER — CHLORHEXIDINE GLUCONATE CLOTH 2 % EX PADS
6.0000 | MEDICATED_PAD | Freq: Once | CUTANEOUS | Status: AC
  Administered 2017-09-26: 6 via TOPICAL

## 2017-09-26 MED ORDER — CARBIDOPA-LEVODOPA 25-100 MG PO TABS
1.0000 | ORAL_TABLET | ORAL | Status: AC
  Administered 2017-09-26: 1 via ORAL
  Filled 2017-09-26: qty 1

## 2017-09-26 NOTE — Consult Note (Signed)
[name]  10/07/1935 [id number]  CARE TEAM:  PCP: Dineen Kid, MD  Outpatient Care Team: Patient Care Team: Via, Lennette Bihari, MD as PCP - General (Family Medicine)  Inpatient Treatment Team: Treatment Team: Attending Provider: Patrecia Pour, MD; Registered Nurse: Evelena Peat, RN; Consulting Physician: Wilford Corner, MD; Rounding Team: Ala Bent, MD; Registered Nurse: Tad Moore, RN; Registered Nurse: Ginette Pitman, RN; Registered Nurse: Cloretta Ned, RN; Rounding Team: Nolon Nations, MD; Respiratory Therapist: Philomena Doheny, RRT   This patient is a 82 y.o.male who presents today for surgical evaluation at the request of Dr Bonner Puna.   Chief complaint / Reason for evaluation: Lower GI bleeding due to internal hemorrhoids  Pleasant elderly gentleman chronically anticoagulated on Xarelto for atrial fibrillation.  Has had several weeks of significant bright red blood and bleeding.  Progressively worsening.  Admitted.  Mobilize.  Xarelto held.  Underwent endoscopy.  History of prior polyps.  Colonoscopy showed internal bleeding hemorrhoids but no other source.  Surgical consultation requested.  Usual followed by [facility] gastroenterology.  History of some constipation usually managed with Colace.  No nausea or vomiting.  No history of bowel obstructions.  No history of prior hemorrhoidal issues.  History of coronary disease with stent placement done June 2018.   Assessment  [name]  82 y.o. male  1 Day Post-Op  Procedure(s): COLONOSCOPY WITH PROPOFOL  Problem List:  Principal Problem:   Acute lower GI bleeding Active Problems:   Post PTCA   Atrial flutter (HCC)   Acute blood loss anemia   Acute kidney injury superimposed on CKD (HCC)   Internal bleeding hemorrhoids   CKD (chronic kidney disease) stage 3, GFR 30-59 ml/min (HCC)   Lower GI bleeding while fully anticoagulated due to internal hemorrhoids  Plan:  Because this is been a  persistent problem for almost a month require admission and no other source is been elucidated, it seems to make sense to go ahead and proceed with hemorrhoidal ligation and possible hemorrhoidectomy this admission.  Patient is very concerned about having severe bleeding again and getting readmitted.  His significant other agrees.  We will work to try and post him later today since he is been off the Xarelto for 4 days now.  It would be my partner, Dr. Brantley Stage.  Fourth case for the day.  May be delayed till tomorrow depending on availability.  The anatomy & physiology of the anorectal region was discussed.  The pathophysiology of hemorrhoids and differential diagnosis was discussed.  Natural history risks without surgery was discussed.   I stressed the importance of a bowel regimen to have daily soft bowel movements to minimize progression of disease.  Interventions such as sclerotherapy & banding were discussed.  The patient's symptoms are not adequately controlled by medicines and other non-operative treatments.  I feel the risks & problems of no surgery outweigh the operative risks; therefore, I recommended surgery to treat the hemorrhoids by ligation, pexy, and possible resection.  Risks such as bleeding, infection, urinary difficulties, injury to other organs, need for repair of tissues / organs, need for further treatment, heart attack, death, and other risks were discussed.   I noted a good likelihood this will help address the problem.  Goals of post-operative recovery were discussed as well.  Possibility that this will not correct all symptoms was explained.  Post-operative pain, bleeding, constipation, and other problems after surgery were discussed.  We will work to minimize complications.   Educational  handouts further explaining the pathology, treatment options, and bowel regimen were given as well.  Questions were answered.  The patient expresses understanding & wishes to proceed with  surgery.     -VTE prophylaxis- SCDs, etc -mobilize as tolerated to help recovery  45 minutes spent in review, evaluation, examination, counseling, and coordination of care.  More than 50% of that time was spent in counseling.  Adin Hector, MD, FACS, MASCRS Gastrointestinal and Minimally Invasive Surgery    [address] [place], [place] [address] [phone] Main / Paging [phone] Fax   09/26/2017      Past Medical History:  Diagnosis Date  . Arthritis    "knees; lower back" (08/09/2016)  . Atrial fibrillation ([place])    remote hx/notes 08/07/2016  . Coronary artery disease   . Episodes of trembling    "most of the time it's my left hand" (08/09/2016)  . GI bleeding 08/2017  . High cholesterol   . History of kidney stones   . Hypertension   . Melanoma of shoulder ([place]) 2000s   "left"  . Mitral regurgitation and mitral stenosis    hx/notes 08/07/2016  . Moderate aortic stenosis    hx/notes 08/07/2016  . Parkinson's disease ([place])   . Prostate cancer ([place]) 2011   hx/notes 08/07/2016    Past Surgical History:  Procedure Laterality Date  . CARDIAC CATHETERIZATION  ~ [address] [place]"  . CARDIOVERSION N/A 01/26/2017   Procedure: CARDIOVERSION;  Surgeon: [name], MD;  Location: [facility];  Service: Cardiovascular;  Laterality: N/A;  . CATARACT EXTRACTION W/ INTRAOCULAR LENS  IMPLANT, BILATERAL Bilateral 2014-02/2016   left-right; hx/notes 08/07/2016  . CORONARY ANGIOPLASTY WITH STENT PLACEMENT  08/09/2016  . CORONARY ATHERECTOMY N/A 08/09/2016   Procedure: Coronary Atherectomy;  Surgeon: Adrian Prows, MD;  Location: [place];  Service: Cardiovascular;  Laterality: N/A;  . CORONARY STENT INTERVENTION N/A 08/09/2016   Procedure: Coronary Stent Intervention;  Surgeon: Adrian Prows, MD;  Location: [facility] [facility];  Service: Cardiovascular;  Laterality: N/A;  LAD  . EYE SURGERY Bilateral 05/2016   "laser on both lenses"   . INGUINAL HERNIA REPAIR Left 1991  . INSERTION PROSTATE RADIATION SEED  06/25/2009  . INTRAVASCULAR PRESSURE WIRE/FFR STUDY N/A 08/09/2016   Procedure: Intravascular Pressure Wire/FFR Study;  Surgeon: Adrian Prows, MD;  Location: [facility];  Service: Cardiovascular;  Laterality: N/A;  . LAPAROSCOPIC CHOLECYSTECTOMY  1993  . MELANOMA EXCISION Left 1990s   "shoulder"  . RIGHT/LEFT HEART CATH AND CORONARY ANGIOGRAPHY N/A 08/09/2016   Procedure: Right/Left Heart Cath and Coronary Angiography;  Surgeon: Adrian Prows, MD;  Location: [place];  Service: Cardiovascular;  Laterality: N/A;  . TEE WITHOUT CARDIOVERSION N/A 01/25/2017   Procedure: TRANSESOPHAGEAL ECHOCARDIOGRAM (TEE);  Surgeon: [name], MD;  Location: [facility];  Service: Cardiovascular;  Laterality: N/A;    Social History   Socioeconomic History  . Marital status: Divorced    Spouse name: Not on file  . Number of children: Not on file  . Years of education: Not on file  . Highest education level: Not on file  Occupational History  . 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 Financial resource strain: Not on file  . Food insecurity:    Worry: Not on file    Inability: Not on file  . Transportation needs:    Medical: Not on  file    Non-medical: Not on file  Tobacco Use  . Smoking status: Never Smoker  . Smokeless tobacco: Never Used  . Tobacco comment: "smoked  2 months when I was 20; non since" (08/09/2016)  Substance and Sexual Activity  . Alcohol use: Yes    Alcohol/week: 8.4 oz    Types: 14 Shots of liquor per week    Comment: 2 drinks per day (canadian mist)  . Drug use: No  . Sexual activity: Not Currently  Lifestyle  . Physical activity:    Days per week: Not on file    Minutes per session: Not on file  . Stress: Not on file  Relationships  . Social connections:    Talks on phone: Not on file    Gets together: Not on file    Attends  religious service: Not on file    Active member of club or organization: Not on file    Attends meetings of clubs or organizations: Not on file    Relationship status: Not on file  . Intimate partner violence:    Fear of current or ex partner: Not on file    Emotionally abused: Not on file    Physically abused: Not on file    Forced sexual activity: Not on file  Other Topics Concern  . Not on file  Social History Narrative   Retired Hotel manager with [employer]    Family History  Problem Relation Age of Onset  . Heart attack Father   . Cerebral aneurysm Sister     Current Facility-Administered Medications  Medication Dose Route Frequency Provider Last Rate Last Dose  . acetaminophen (TYLENOL) tablet 650 mg  650 mg Oral Q6H PRN Laurence Spates, MD       Or  . acetaminophen (TYLENOL) suppository 650 mg  650 mg Rectal Q6H PRN Laurence Spates, MD      . amLODipine (NORVASC) tablet 5 mg  5 mg Oral Daily Laurence Spates, MD   5 mg at 09/25/17 1446  . carbidopa-levodopa (SINEMET IR) 25-100 MG per tablet immediate release 1 tablet  1 tablet Oral TID Laurence Spates, MD   1 tablet at 09/25/17 2109  . cholecalciferol (VITAMIN D) tablet 1,000 Units  1,000 Units Oral Daily Laurence Spates, MD   1,000 Units at 09/25/17 1445  . losartan (COZAAR) tablet 25 mg  25 mg Oral QPM Laurence Spates, MD   25 mg at 09/25/17 1834  . metoprolol succinate (TOPROL-XL) 24 hr tablet 100 mg  100 mg Oral q morning - 10a Laurence Spates, MD   100 mg at 09/25/17 1445  . ondansetron (ZOFRAN) tablet 4 mg  4 mg Oral Q6H PRN Laurence Spates, MD       Or  . ondansetron [facility]) injection 4 mg  4 mg Intravenous Q6H PRN Laurence Spates, MD      . pantoprazole (PROTONIX) EC tablet 40 mg  40 mg Oral Daily Laurence Spates, MD   40 mg at 09/25/17 1444  . polyethylene glycol (MIRALAX / GLYCOLAX) packet 17 g  17 g Oral BID Laurence Spates, MD   17 g at 09/24/17 0915  . rosuvastatin (CRESTOR) tablet 10 mg  10 mg Oral Madolyn Frieze, MD   10  mg at 09/25/17 2109  . tamsulosin (FLOMAX) capsule 0.4 mg  0.4 mg Oral Madolyn Frieze, MD   0.4 mg at 09/25/17 2109  . traMADol (ULTRAM) tablet 50 mg  50 mg Oral Q6H PRN Laurence Spates, MD      .  traZODone (DESYREL) tablet 25 mg  25 mg Oral QHS PRN Laurence Spates, MD      . triamterene-hydrochlorothiazide [facility]) 37.5-25 MG per tablet 1 tablet  1 tablet Oral Daily Laurence Spates, MD   1 tablet at 09/25/17 1553     Allergies  Allergen Reactions  . Nsaids Other (See Comments)    Cannot have any of these because he is currently taking Xarelto    ROS:   All other systems reviewed & are negative except per HPI or as noted below: Constitutional:  No fevers, chills, sweats.  Weight stable Eyes:  No vision changes, No discharge HENT:  No sore throats, nasal drainage Lymph: No neck swelling, No bruising easily Pulmonary:  No cough, productive sputum CV: No orthopnea, PND  Patient walks 10 minutes difficulty.  No exertional chest/neck/shoulder/arm pain. GI:  No personal nor family history of GI/colon cancer, inflammatory bowel disease, irritable bowel syndrome, allergy such as Celiac Sprue, dietary/dairy problems, colitis, ulcers nor gastritis.  No recent sick contacts/gastroenteritis.  No travel outside the country.  No changes in diet.  Positive polyps. Renal: No UTIs, No hematuria Genital:  No drainage, bleeding, masses Musculoskeletal: No severe joint pain.  Good ROM major joints Skin:  No sores or lesions.  No rashes Heme/Lymph:  No easy bleeding.  No swollen lymph nodes Neuro: No focal weakness/numbness.  No seizures Psych: No suicidal ideation.  No hallucinations  BP 124/69 (BP Location: Right Arm)   Pulse 67   Temp 98 F (36.7 C) (Oral)   Resp 17   Ht '5\' 11"'  (1.803 m)   Wt 94.2 kg (207 lb 10.8 oz)   SpO2 97%   BMI 28.96 kg/m   Physical Exam: General: Pt awake/alert/oriented x4 in no major acute distress Eyes: PERRL, normal EOM. Sclera nonicteric Neuro: CN II-XII  intact w/o focal sensory/motor deficits. Lymph: No head/neck/groin lymphadenopathy Psych:  No delerium/psychosis/paranoia HENT: Normocephalic, Mucus membranes moist.  No thrush Neck: Supple, No tracheal deviation Chest: No pain.  Good respiratory excursion. CV:  Pulses intact.  Regular rhythm Abdomen: Soft, Nondistended.  Nontender.  No incarcerated hernias. Gen:  No inguinal hernias.  No inguinal lymphadenopathy.   Old blood with some mild perianal swelling consistent with inflamed hemorrhoids.  Nothing prolapse.  No abscess.  No fissure.  No obvious fistula.  No pilonidal disease.  Hold off on digital exam given colonoscopy done yesterday. Ext:  SCDs BLE.  No significant edema.  No cyanosis Skin: No petechiae / purpurea.  No major sores Musculoskeletal: No severe joint pain.  Good ROM major joints   Results:   Labs: Results for orders placed or performed during the hospital encounter of 09/21/17 (from the past 48 hour(s))  CBC     Status: Abnormal   Collection Time: 09/24/17 12:10 PM  Result Value Ref Range   WBC 4.7 4.0 - 10.5 K/uL   RBC 3.14 (L) 4.22 - 5.81 MIL/uL   Hemoglobin 8.9 (L) 13.0 - 17.0 g/dL   HCT 29.3 (L) 39.0 - 52.0 %   MCV 93.3 78.0 - 100.0 fL   MCH 28.3 26.0 - 34.0 pg   MCHC 30.4 30.0 - 36.0 g/dL   RDW 14.2 11.5 - 15.5 %   Platelets 158 150 - 400 K/uL    Comment: Performed at [facility], [place] [address] [place], [place] [address]  CBC     Status: Abnormal   Collection Time: 09/24/17  8:58 PM  Result Value Ref Range   WBC  4.6 4.0 - 10.5 K/uL   RBC 3.01 (L) 4.22 - 5.81 MIL/uL   Hemoglobin 8.5 (L) 13.0 - 17.0 g/dL   HCT 28.2 (L) 39.0 - 52.0 %   MCV 93.7 78.0 - 100.0 fL   MCH 28.2 26.0 - 34.0 pg   MCHC 30.1 30.0 - 36.0 g/dL   RDW 13.9 11.5 - 15.5 %   Platelets 150 150 - 400 K/uL    Comment: Performed at [facility] [address] [place], [place] [address]  Bilirubin, fractionated(tot/dir/indir)     Status: Abnormal   Collection Time:  09/25/17  5:24 AM  Result Value Ref Range   Total Bilirubin 2.2 (H) 0.3 - 1.2 mg/dL   Bilirubin, Direct 0.3 (H) 0.0 - 0.2 mg/dL   Indirect Bilirubin 1.9 (H) 0.3 - 0.9 mg/dL    Comment: Performed at [facility] [address] [place], [place] [address]  CBC     Status: Abnormal   Collection Time: 09/25/17  5:24 AM  Result Value Ref Range   WBC 3.5 (L) 4.0 - 10.5 K/uL   RBC 2.83 (L) 4.22 - 5.81 MIL/uL   Hemoglobin 8.0 (L) 13.0 - 17.0 g/dL   HCT 26.2 (L) 39.0 - 52.0 %   MCV 92.6 78.0 - 100.0 fL   MCH 28.3 26.0 - 34.0 pg   MCHC 30.5 30.0 - 36.0 g/dL   RDW 13.8 11.5 - 15.5 %   Platelets 126 (L) 150 - 400 K/uL    Comment: Performed at [facility], [place] [address] [place], [place] [address]  CBC     Status: Abnormal   Collection Time: 09/26/17  5:50 AM  Result Value Ref Range   WBC 3.4 (L) 4.0 - 10.5 K/uL   RBC 2.83 (L) 4.22 - 5.81 MIL/uL   Hemoglobin 8.0 (L) 13.0 - 17.0 g/dL   HCT 25.7 (L) 39.0 - 52.0 %   MCV 90.8 78.0 - 100.0 fL   MCH 28.3 26.0 - 34.0 pg   MCHC 31.1 30.0 - 36.0 g/dL   RDW 13.8 11.5 - 15.5 %   Platelets 133 (L) 150 - 400 K/uL    Comment: Performed at [facility], [place] [address] [place], [place] [address]  Basic metabolic panel     Status: Abnormal   Collection Time: 09/26/17  5:50 AM  Result Value Ref Range   Sodium 136 135 - 145 mmol/L   Potassium 4.0 3.5 - 5.1 mmol/L   Chloride 104 98 - 111 mmol/L   CO2 27 22 - 32 mmol/L   Glucose, Bld 88 70 - 99 mg/dL   BUN 15 8 - 23 mg/dL   Creatinine, Ser 1.50 (H) 0.61 - 1.24 mg/dL   Calcium 8.8 (L) 8.9 - 10.3 mg/dL   GFR calc non Af Amer 43 (L) >60 mL/min   GFR calc Af Amer 49 (L) >60 mL/min    Comment: (NOTE) The eGFR has been calculated using the CKD EPI equation. This calculation has not been validated in all clinical situations. eGFR's persistently <60 mL/min signify possible Chronic Kidney Disease.    Anion gap 5 5 - 15    Comment: Performed at [facility] [address],  [place], [place] [address]  Type and screen [place]     Status: None   Collection Time: 09/26/17  5:50 AM  Result Value Ref Range   ABO/RH(D) B POS    Antibody Screen NEG  Sample Expiration      09/29/2017 Performed at [facility], [place], [place], [place] [address]     Imaging / Studies: US Abdomen Limited Ruq  Result Date: 09/24/2017 CLINICAL DATA:  Hyperbilirubinemia. EXAM: ULTRASOUND ABDOMEN LIMITED RIGHT UPPER QUADRANT COMPARISON:  Abdominal CT 09/01/2017 FINDINGS: Gallbladder: Surgically absent. Common bile duct: Diameter: 4 mm were visualized. Liver: Technically limited evaluation given elevated right hemidiaphragm and high position of the liver in the abdomen. No focal lesion identified. Within normal limits in parenchymal echogenicity. Portal vein is patent on color Doppler imaging with normal direction of blood flow towards the liver. IMPRESSION: 1. Postcholecystectomy without biliary dilatation. 2. Limited assessment of the liver demonstrates no sonographic abnormality or explanation for hyperbilirubinemia. Sonographic liver evaluation is limited given high positioning under elevated hemidiaphragm. Electronically Signed   By: Jeb Levering M.D.   On: 09/24/2017 04:23    Medications / Allergies: per chart  Antibiotics: Anti-infectives (From admission, onward)   None        Note: Portions of this report may have been transcribed using voice recognition software. Every effort was made to ensure accuracy; however, inadvertent computerized transcription errors may be present.   Any transcriptional errors that result from this process are unintentional.    Adin Hector, MD, FACS, MASCRS Gastrointestinal and Minimally Invasive Surgery    [address], [place], [place] [address] [phone] Main / Paging [phone] Fax   09/26/2017

## 2017-09-26 NOTE — Progress Notes (Signed)
PROGRESS NOTE  [name]  DVV:[id number] DOB: April 12, 1935 DOA: 09/21/2017 PCP: Dineen Kid, MD   Brief Narrative: [name] is a 82 y.o. male with a history of AFib on xarelto, CAD s/p PCI in June 2018, and sigmoid diverticulosis on colonoscopy 2016 and recent hematuria who presented to the ED with 2 weeks of GI bleeding, bloody BMs in setting of constipation. Hgb noted to be down to 9 from baseline of 11. He was admitted and GI consulted. Xarelto has been held and bleeding is ongoing. GI performed colonoscopy 7/29 showing bleeding internal hemorrhoids, otherwise normal mucosa. General surgery has been consulted per GI recommendations and plans for hemorrhoidal ligation and possible hemorrhoidectomy prior to discharge. Hgb has trended slowly downward but has not required a transfusion as of yet.   Assessment & Plan: Principal Problem:   Acute lower GI bleeding Active Problems:   Post PTCA   Atrial flutter (HCC)   Acute blood loss anemia   Acute kidney injury superimposed on CKD (HCC)   Internal bleeding hemorrhoids   CKD (chronic kidney disease) stage 3, GFR 30-59 ml/min (HCC)   Chronic anticoagulation  Acute lower GI bleeding with acute blood loss anemia: Followed by Dr. Watt Climes as outpatient, previously reported diverticulosis. Colonoscopy 7/29 showed normal mucosa, an isolated right sided diverticulum and bleeding internal hemorrhoids, the suspected source. Still bleeding despite off anticoagulation, has been bleeding for nearly 3 weeks. - General surgery consultation appreciated, planning ligation and/or hemorrhoidectomy when able (today vs. tomorrow) prior to discharge.  - Recheck CBC in AM. Would transfuse for hgb < 8g/dl. - Continue to hold anticoagulation  Persistent AFib:  - Rate is controlled, continue metoprolol - Holding xarelto as above. CHA2DS2-VASc is 4.   CAD s/p PCI, HTN: Seen by Dr. Virgina Jock, recently taken off aspirin, formerly taken off plavix  following PCI in June 2018. EF 50-55% in Nov 2018. - Will continue to hold antiplatelets as these were discontinued as outpatient. - Monitor for evidence of ACS, none currently.  - Continue metoprolol, ARB, statin, norvasc, triamterene, HCTZ.  AKI on stage III CKD: Baseline creatinine likely 1.5-1.6, up to 2.13 on admission. Likely prerenal, improved with fluids.  - Avoid nephrotoxins  Parkinson's disease: Dx late 2018, followed by Dr. Carles Collet. No worsening of symptoms noted. - Continue sinemet  History of prostate CA Tx w/brachytherapy and recent hematuria:  - Follow up with urology.   Indirect hyperbilirubinemia: Mild, not felt to be due to hemolysis. Possible gilbert's. RUQ U/S benign, h/o cholecystectomy. - Monitor  DVT prophylaxis: SCDs Code Status: Full Family Communication: None at bedside this AM. Discussed in detail with wignificant other at bedside yesterday PM. Disposition Plan: Home once work up complete and patient stable.  Consultants:   [facility]  General surgery  Procedures:   Colonoscopy 09/25/2017 by Dr. Oletta Lamas:  Findings:      The perianal exam findings include bright red blood on the examining       finger.      Normal mucosa was found in the sigmoid colon, in the descending colon       and in the right colon. there was no bleeding or clots in this part of       the colon. The prep was not adequate to rule out small polyps but no       gross masses were saying.      Red blood was found in the rectum. the rectum was vigorously irrigated  no active bleeding was seen except in the retroflex view right around       the anal area where he had internal hemorrhoids. Impression:       - Preparation of the colon was inadequate.                           - Bright red blood on the examining finger found on                            perianal exam. this is consistent with bleeding                            internal hemorrhoids                           - Normal  mucosa in the sigmoid colon, in the                            descending colon and in the right colon.                           - Blood in the rectum.                           - No specimens collected. Recommendation: Clear liquid diet.                           - Refer to a colo-rectal surgeon at appointment to                            be scheduled.                           - Repeat colonoscopy for surveillance. this patient                            has had polyps removed in the prep for this                            colonoscopy was not adequate to rule out polyps    Antimicrobials:  None   Subjective: Having red blood per rectum last night and this morning. No chest pain, dyspnea, dizziness, syncope, abd pain, N/V. Excited to get the surgery.  Objective: Vitals:   09/25/17 1250 09/25/17 1500 09/25/17 2120 09/26/17 0443  BP: 128/64 117/72 126/71 124/69  Pulse:  66 70 67  Resp:  15 16 17   Temp:  98 F (36.7 C) 98.2 F (36.8 C) 98 F (36.7 C)  TempSrc:  Oral Oral Oral  SpO2:  98% 99% 97%  Weight:      Height:        Intake/Output Summary (Last 24 hours) at 09/26/2017 1245 Last data filed at 09/26/2017 0700 Gross per 24 hour  Intake -  Output 400 ml  Net -400 ml   Filed Weights   09/21/17 0913 09/21/17 1306  Weight: 95.3 kg (210 lb) 94.2 kg (207 lb 10.8 oz)  Gen: Pleasant, elderly male in no distress Pulm: Nonlabored breathing room air. Clear. CV: Irreg, rate in 57'D, has I/VI systolic murmur at apex, otherwise no rub, or gallop. No JVD, trace dependent edema. GI: Abdomen soft, non-tender, non-distended, with normoactive bowel sounds.  Ext: Warm, no deformities Skin: No rashes, lesions or ulcers on visualized skin.  Neuro: Alert and oriented. No focal neurological deficits. Psych: Judgement and insight appear fair. Mood euthymic & affect congruent. Behavior is appropriate.    Data Reviewed: I have personally reviewed following labs and imaging  studies  CBC: Recent Labs  Lab 09/21/17 0931  09/24/17 0643 09/24/17 1210 09/24/17 2058 09/25/17 0524 09/26/17 0550  WBC 4.5   < > 3.6* 4.7 4.6 3.5* 3.4*  NEUTROABS 3.1  --   --   --   --   --   --   HGB 9.7*   < > 8.4* 8.9* 8.5* 8.0* 8.0*  HCT 31.5*   < > 27.7* 29.3* 28.2* 26.2* 25.7*  MCV 91.8   < > 93.6 93.3 93.7 92.6 90.8  PLT 151   < > 139* 158 150 126* 133*   < > = values in this interval not displayed.   Basic Metabolic Panel: Recent Labs  Lab 09/21/17 0931 09/22/17 0409 09/23/17 0312 09/24/17 0643 09/26/17 0550  NA 138 138 137 138 136  K 3.8 4.1 4.5 4.5 4.0  CL 99 100 102 103 104  CO2 29 29 26 27 27   GLUCOSE 97 97 95 88 88  BUN 32* 28* 23 21 15   CREATININE 2.13* 1.85* 1.65* 1.59* 1.50*  CALCIUM 9.3 8.9 8.8* 8.9 8.8*   GFR: Estimated Creatinine Clearance: 46.8 mL/min (A) (by C-G formula based on SCr of 1.5 mg/dL (H)). Liver Function Tests: Recent Labs  Lab 09/21/17 0931 09/23/17 0312 09/25/17 0524  AST 24 22  --   ALT 5 7  --   ALKPHOS 101 83  --   BILITOT 2.3* 2.1* 2.2*  PROT 6.9 5.7*  --   ALBUMIN 4.0 3.1*  --    Coagulation Profile: Recent Labs  Lab 09/21/17 0931 09/24/17 0643  INR 1.87 1.31   Radiology Studies: No results found.  Scheduled Meds: . amLODipine  5 mg Oral Daily  . bupivacaine liposome  20 mL Infiltration Once  . carbidopa-levodopa  1 tablet Oral TID  . Chlorhexidine Gluconate Cloth  6 each Topical Once  . cholecalciferol  1,000 Units Oral Daily  . losartan  25 mg Oral QPM  . metoprolol succinate  100 mg Oral q morning - 10a  . pantoprazole  40 mg Oral Daily  . polyethylene glycol  17 g Oral BID  . rosuvastatin  10 mg Oral QHS  . tamsulosin  0.4 mg Oral QHS  . triamterene-hydrochlorothiazide  1 tablet Oral Daily   Continuous Infusions: .  ceFAZolin (ANCEF) IV     And  . metronidazole       LOS: 4 days   Time spent: 25 minutes.  Patrecia Pour, MD [facility] [email or web address] Password [email or web address] 09/26/2017,  12:45 PM

## 2017-09-26 NOTE — H&P (View-Only) (Signed)
[name]  [birth year] [id number]  CARE TEAM:  PCP: Dineen Kid, MD  Outpatient Care Team: Patient Care Team: Via, Lennette Bihari, MD as PCP - General (Family Medicine)  Inpatient Treatment Team: Treatment Team: Attending Provider: Patrecia Pour, MD; Registered Nurse: Evelena Peat, RN; Consulting Physician: Wilford Corner, MD; Rounding Team: Ala Bent, MD; Registered Nurse: Tad Moore, RN; Registered Nurse: Ginette Pitman, RN; Registered Nurse: Cloretta Ned, RN; Rounding Team: Nolon Nations, MD; Respiratory Therapist: Philomena Doheny, RRT   This patient is a 82 y.o.male who presents today for surgical evaluation at the request of Dr Bonner Puna.   Chief complaint / Reason for evaluation: Lower GI bleeding due to internal hemorrhoids  Pleasant elderly gentleman chronically anticoagulated on Xarelto for atrial fibrillation.  Has had several weeks of significant bright red blood and bleeding.  Progressively worsening.  Admitted.  Mobilize.  Xarelto held.  Underwent endoscopy.  History of prior polyps.  Colonoscopy showed internal bleeding hemorrhoids but no other source.  Surgical consultation requested.  Usual followed by [facility] gastroenterology.  History of some constipation usually managed with Colace.  No nausea or vomiting.  No history of bowel obstructions.  No history of prior hemorrhoidal issues.  History of coronary disease with stent placement done June 2018.   Assessment  Evans Lance  82 y.o. male  1 Day Post-Op  Procedure(s): COLONOSCOPY WITH PROPOFOL  Problem List:  Principal Problem:   Acute lower GI bleeding Active Problems:   Post PTCA   Atrial flutter (HCC)   Acute blood loss anemia   Acute kidney injury superimposed on CKD (HCC)   Internal bleeding hemorrhoids   CKD (chronic kidney disease) stage 3, GFR 30-59 ml/min (HCC)   Lower GI bleeding while fully anticoagulated due to internal hemorrhoids  Plan:  Because this is been a  persistent problem for almost a month require admission and no other source is been elucidated, it seems to make sense to go ahead and proceed with hemorrhoidal ligation and possible hemorrhoidectomy this admission.  Patient is very concerned about having severe bleeding again and getting readmitted.  His significant other agrees.  We will work to try and post him later today since he is been off the Xarelto for 4 days now.  It would be my partner, Dr. Brantley Stage.  Fourth case for the day.  May be delayed till tomorrow depending on availability.  The anatomy & physiology of the anorectal region was discussed.  The pathophysiology of hemorrhoids and differential diagnosis was discussed.  Natural history risks without surgery was discussed.   I stressed the importance of a bowel regimen to have daily soft bowel movements to minimize progression of disease.  Interventions such as sclerotherapy & banding were discussed.  The patient's symptoms are not adequately controlled by medicines and other non-operative treatments.  I feel the risks & problems of no surgery outweigh the operative risks; therefore, I recommended surgery to treat the hemorrhoids by ligation, pexy, and possible resection.  Risks such as bleeding, infection, urinary difficulties, injury to other organs, need for repair of tissues / organs, need for further treatment, heart attack, death, and other risks were discussed.   I noted a good likelihood this will help address the problem.  Goals of post-operative recovery were discussed as well.  Possibility that this will not correct all symptoms was explained.  Post-operative pain, bleeding, constipation, and other problems after surgery were discussed.  We will work to minimize complications.   Educational  handouts further explaining the pathology, treatment options, and bowel regimen were given as well.  Questions were answered.  The patient expresses understanding & wishes to proceed with  surgery.     -VTE prophylaxis- SCDs, etc -mobilize as tolerated to help recovery  45 minutes spent in review, evaluation, examination, counseling, and coordination of care.  More than 50% of that time was spent in counseling.  Adin Hector, MD, FACS, MASCRS Gastrointestinal and Minimally Invasive Surgery    [address] [place], [place] [address] [phone] Main / Paging [phone] Fax   09/26/2017      Past Medical History:  Diagnosis Date  . Arthritis    "knees; lower back" (08/09/2016)  . Atrial fibrillation ([place])    remote hx/notes 08/07/2016  . Coronary artery disease   . Episodes of trembling    "most of the time it's my left hand" (08/09/2016)  . GI bleeding 08/2017  . High cholesterol   . History of kidney stones   . Hypertension   . Melanoma of shoulder ([place]) 2000s   "left"  . Mitral regurgitation and mitral stenosis    hx/notes 08/07/2016  . Moderate aortic stenosis    hx/notes 08/07/2016  . Parkinson's disease ([place])   . Prostate cancer ([place]) 2011   hx/notes 08/07/2016    Past Surgical History:  Procedure Laterality Date  . CARDIAC CATHETERIZATION  ~ [address] [place]"  . CARDIOVERSION N/A 01/26/2017   Procedure: CARDIOVERSION;  Surgeon: [name], MD;  Location: [facility];  Service: Cardiovascular;  Laterality: N/A;  . CATARACT EXTRACTION W/ INTRAOCULAR LENS  IMPLANT, BILATERAL Bilateral 2014-02/2016   left-right; hx/notes 08/07/2016  . CORONARY ANGIOPLASTY WITH STENT PLACEMENT  08/09/2016  . CORONARY ATHERECTOMY N/A 08/09/2016   Procedure: Coronary Atherectomy;  Surgeon: Adrian Prows, MD;  Location: [facility];  Service: Cardiovascular;  Laterality: N/A;  . CORONARY STENT INTERVENTION N/A 08/09/2016   Procedure: Coronary Stent Intervention;  Surgeon: Adrian Prows, MD;  Location: [facility];  Service: Cardiovascular;  Laterality: N/A;  LAD  . EYE SURGERY Bilateral 05/2016   "laser on both lenses"   . INGUINAL HERNIA REPAIR Left 1991  . INSERTION PROSTATE RADIATION SEED  06/25/2009  . INTRAVASCULAR PRESSURE WIRE/FFR STUDY N/A 08/09/2016   Procedure: Intravascular Pressure Wire/FFR Study;  Surgeon: Adrian Prows, MD;  Location: [facility];  Service: Cardiovascular;  Laterality: N/A;  . LAPAROSCOPIC CHOLECYSTECTOMY  1993  . MELANOMA EXCISION Left 1990s   "shoulder"  . RIGHT/LEFT HEART CATH AND CORONARY ANGIOGRAPHY N/A 08/09/2016   Procedure: Right/Left Heart Cath and Coronary Angiography;  Surgeon: Adrian Prows, MD;  Location: [facility];  Service: Cardiovascular;  Laterality: N/A;  . TEE WITHOUT CARDIOVERSION N/A 01/25/2017   Procedure: TRANSESOPHAGEAL ECHOCARDIOGRAM (TEE);  Surgeon: [name], MD;  [facility];  Service: Cardiovascular;  Laterality: N/A;    Social History   Socioeconomic History  . Marital status: Divorced    Spouse name: Not on file  . Number of children: Not on file  . Years of education: Not on file  . Highest education level: Not on file  Occupational History  . 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 Financial resource strain: Not on file  . Food insecurity:    Worry: Not on file    Inability: Not on file  . Transportation needs:    Medical: Not on  file    Non-medical: Not on file  Tobacco Use  . Smoking status: Never Smoker  . Smokeless tobacco: Never Used  . Tobacco comment: "smoked  2 months when I was 20; non since" (08/09/2016)  Substance and Sexual Activity  . Alcohol use: Yes    Alcohol/week: 8.4 oz    Types: 14 Shots of liquor per week    Comment: 2 drinks per day (canadian mist)  . Drug use: No  . Sexual activity: Not Currently  Lifestyle  . Physical activity:    Days per week: Not on file    Minutes per session: Not on file  . Stress: Not on file  Relationships  . Social connections:    Talks on phone: Not on file    Gets together: Not on file    Attends  religious service: Not on file    Active member of club or organization: Not on file    Attends meetings of clubs or organizations: Not on file    Relationship status: Not on file  . Intimate partner violence:    Fear of current or ex partner: Not on file    Emotionally abused: Not on file    Physically abused: Not on file    Forced sexual activity: Not on file  Other Topics Concern  . Not on file  Social History Narrative   Retired Hotel manager with [employer]    Family History  Problem Relation Age of Onset  . Heart attack Father   . Cerebral aneurysm Sister     Current Facility-Administered Medications  Medication Dose Route Frequency Provider Last Rate Last Dose  . acetaminophen (TYLENOL) tablet 650 mg  650 mg Oral Q6H PRN Laurence Spates, MD       Or  . acetaminophen (TYLENOL) suppository 650 mg  650 mg Rectal Q6H PRN Laurence Spates, MD      . amLODipine (NORVASC) tablet 5 mg  5 mg Oral Daily Laurence Spates, MD   5 mg at 09/25/17 1446  . carbidopa-levodopa (SINEMET IR) 25-100 MG per tablet immediate release 1 tablet  1 tablet Oral TID Laurence Spates, MD   1 tablet at 09/25/17 2109  . cholecalciferol (VITAMIN D) tablet 1,000 Units  1,000 Units Oral Daily Laurence Spates, MD   1,000 Units at 09/25/17 1445  . losartan (COZAAR) tablet 25 mg  25 mg Oral QPM Laurence Spates, MD   25 mg at 09/25/17 1834  . metoprolol succinate (TOPROL-XL) 24 hr tablet 100 mg  100 mg Oral q morning - 10a Laurence Spates, MD   100 mg at 09/25/17 1445  . ondansetron (ZOFRAN) tablet 4 mg  4 mg Oral Q6H PRN Laurence Spates, MD       Or  . ondansetron [facility]) injection 4 mg  4 mg Intravenous Q6H PRN Laurence Spates, MD      . pantoprazole (PROTONIX) EC tablet 40 mg  40 mg Oral Daily Laurence Spates, MD   40 mg at 09/25/17 1444  . polyethylene glycol (MIRALAX / GLYCOLAX) packet 17 g  17 g Oral BID Laurence Spates, MD   17 g at 09/24/17 0915  . rosuvastatin (CRESTOR) tablet 10 mg  10 mg Oral Madolyn Frieze, MD   10  mg at 09/25/17 2109  . tamsulosin (FLOMAX) capsule 0.4 mg  0.4 mg Oral Madolyn Frieze, MD   0.4 mg at 09/25/17 2109  . traMADol (ULTRAM) tablet 50 mg  50 mg Oral Q6H PRN Laurence Spates, MD      .  traZODone (DESYREL) tablet 25 mg  25 mg Oral QHS PRN Laurence Spates, MD      . triamterene-hydrochlorothiazide [facility]) 37.5-25 MG per tablet 1 tablet  1 tablet Oral Daily Laurence Spates, MD   1 tablet at 09/25/17 1553     Allergies  Allergen Reactions  . Nsaids Other (See Comments)    Cannot have any of these because he is currently taking Xarelto    ROS:   All other systems reviewed & are negative except per HPI or as noted below: Constitutional:  No fevers, chills, sweats.  Weight stable Eyes:  No vision changes, No discharge HENT:  No sore throats, nasal drainage Lymph: No neck swelling, No bruising easily Pulmonary:  No cough, productive sputum CV: No orthopnea, PND  Patient walks 10 minutes difficulty.  No exertional chest/neck/shoulder/arm pain. GI:  No personal nor family history of GI/colon cancer, inflammatory bowel disease, irritable bowel syndrome, allergy such as Celiac Sprue, dietary/dairy problems, colitis, ulcers nor gastritis.  No recent sick contacts/gastroenteritis.  No travel outside the country.  No changes in diet.  Positive polyps. Renal: No UTIs, No hematuria Genital:  No drainage, bleeding, masses Musculoskeletal: No severe joint pain.  Good ROM major joints Skin:  No sores or lesions.  No rashes Heme/Lymph:  No easy bleeding.  No swollen lymph nodes Neuro: No focal weakness/numbness.  No seizures Psych: No suicidal ideation.  No hallucinations  BP 124/69 (BP Location: Right Arm)   Pulse 67   Temp 98 F (36.7 C) (Oral)   Resp 17   Ht '5\' 11"'  (1.803 m)   Wt 94.2 kg (207 lb 10.8 oz)   SpO2 97%   BMI 28.96 kg/m   Physical Exam: General: Pt awake/alert/oriented x4 in no major acute distress Eyes: PERRL, normal EOM. Sclera nonicteric Neuro: CN II-XII  intact w/o focal sensory/motor deficits. Lymph: No head/neck/groin lymphadenopathy Psych:  No delerium/psychosis/paranoia HENT: Normocephalic, Mucus membranes moist.  No thrush Neck: Supple, No tracheal deviation Chest: No pain.  Good respiratory excursion. CV:  Pulses intact.  Regular rhythm Abdomen: Soft, Nondistended.  Nontender.  No incarcerated hernias. Gen:  No inguinal hernias.  No inguinal lymphadenopathy.   Old blood with some mild perianal swelling consistent with inflamed hemorrhoids.  Nothing prolapse.  No abscess.  No fissure.  No obvious fistula.  No pilonidal disease.  Hold off on digital exam given colonoscopy done yesterday. Ext:  SCDs BLE.  No significant edema.  No cyanosis Skin: No petechiae / purpurea.  No major sores Musculoskeletal: No severe joint pain.  Good ROM major joints   Results:   Labs: Results for orders placed or performed during the hospital encounter of 09/21/17 (from the past 48 hour(s))  CBC     Status: Abnormal   Collection Time: 09/24/17 12:10 PM  Result Value Ref Range   WBC 4.7 4.0 - 10.5 K/uL   RBC 3.14 (L) 4.22 - 5.81 MIL/uL   Hemoglobin 8.9 (L) 13.0 - 17.0 g/dL   HCT 29.3 (L) 39.0 - 52.0 %   MCV 93.3 78.0 - 100.0 fL   MCH 28.3 26.0 - 34.0 pg   MCHC 30.4 30.0 - 36.0 g/dL   RDW 14.2 11.5 - 15.5 %   Platelets 158 150 - 400 K/uL    Comment: Performed at [facility], [place] [address] [place], [place] [address]  CBC     Status: Abnormal   Collection Time: 09/24/17  8:58 PM  Result Value Ref Range   WBC  4.6 4.0 - 10.5 K/uL   RBC 3.01 (L) 4.22 - 5.81 MIL/uL   Hemoglobin 8.5 (L) 13.0 - 17.0 g/dL   HCT 28.2 (L) 39.0 - 52.0 %   MCV 93.7 78.0 - 100.0 fL   MCH 28.2 26.0 - 34.0 pg   MCHC 30.1 30.0 - 36.0 g/dL   RDW 13.9 11.5 - 15.5 %   Platelets 150 150 - 400 K/uL    Comment: Performed at [facility] [address] [place], [place] [address]  Bilirubin, fractionated(tot/dir/indir)     Status: Abnormal   Collection Time:  09/25/17  5:24 AM  Result Value Ref Range   Total Bilirubin 2.2 (H) 0.3 - 1.2 mg/dL   Bilirubin, Direct 0.3 (H) 0.0 - 0.2 mg/dL   Indirect Bilirubin 1.9 (H) 0.3 - 0.9 mg/dL    Comment: Performed at [facility] [address], [place], [place] [address]  CBC     Status: Abnormal   Collection Time: 09/25/17  5:24 AM  Result Value Ref Range   WBC 3.5 (L) 4.0 - 10.5 K/uL   RBC 2.83 (L) 4.22 - 5.81 MIL/uL   Hemoglobin 8.0 (L) 13.0 - 17.0 g/dL   HCT 26.2 (L) 39.0 - 52.0 %   MCV 92.6 78.0 - 100.0 fL   MCH 28.3 26.0 - 34.0 pg   MCHC 30.5 30.0 - 36.0 g/dL   RDW 13.8 11.5 - 15.5 %   Platelets 126 (L) 150 - 400 K/uL    Comment: Performed at [facility], [place] [address] [place], [place] [address]  CBC     Status: Abnormal   Collection Time: 09/26/17  5:50 AM  Result Value Ref Range   WBC 3.4 (L) 4.0 - 10.5 K/uL   RBC 2.83 (L) 4.22 - 5.81 MIL/uL   Hemoglobin 8.0 (L) 13.0 - 17.0 g/dL   HCT 25.7 (L) 39.0 - 52.0 %   MCV 90.8 78.0 - 100.0 fL   MCH 28.3 26.0 - 34.0 pg   MCHC 31.1 30.0 - 36.0 g/dL   RDW 13.8 11.5 - 15.5 %   Platelets 133 (L) 150 - 400 K/uL    Comment: Performed at [facility], [place] [address], [place], [place] [address]  Basic metabolic panel     Status: Abnormal   Collection Time: 09/26/17  5:50 AM  Result Value Ref Range   Sodium 136 135 - 145 mmol/L   Potassium 4.0 3.5 - 5.1 mmol/L   Chloride 104 98 - 111 mmol/L   CO2 27 22 - 32 mmol/L   Glucose, Bld 88 70 - 99 mg/dL   BUN 15 8 - 23 mg/dL   Creatinine, Ser 1.50 (H) 0.61 - 1.24 mg/dL   Calcium 8.8 (L) 8.9 - 10.3 mg/dL   GFR calc non Af Amer 43 (L) >60 mL/min   GFR calc Af Amer 49 (L) >60 mL/min    Comment: (NOTE) The eGFR has been calculated using the CKD EPI equation. This calculation has not been validated in all clinical situations. eGFR's persistently <60 mL/min signify possible Chronic Kidney Disease.    Anion gap 5 5 - 15    Comment: Performed at [facility] [address]  [place],  [address]  Type and screen [place]     Status: None   Collection Time: 09/26/17  5:50 AM  Result Value Ref Range   ABO/RH(D) B POS    Antibody Screen NEG  Sample Expiration      09/29/2017 Performed at [facility], [place] [address] [place], [place] [address]     Imaging / Studies: US Abdomen Limited Ruq  Result Date: 09/24/2017 CLINICAL DATA:  Hyperbilirubinemia. EXAM: ULTRASOUND ABDOMEN LIMITED RIGHT UPPER QUADRANT COMPARISON:  Abdominal CT 09/01/2017 FINDINGS: Gallbladder: Surgically absent. Common bile duct: Diameter: 4 mm were visualized. Liver: Technically limited evaluation given elevated right hemidiaphragm and high position of the liver in the abdomen. No focal lesion identified. Within normal limits in parenchymal echogenicity. Portal vein is patent on color Doppler imaging with normal direction of blood flow towards the liver. IMPRESSION: 1. Postcholecystectomy without biliary dilatation. 2. Limited assessment of the liver demonstrates no sonographic abnormality or explanation for hyperbilirubinemia. Sonographic liver evaluation is limited given high positioning under elevated hemidiaphragm. Electronically Signed   By: Jeb Levering M.D.   On: 09/24/2017 04:23    Medications / Allergies: per chart  Antibiotics: Anti-infectives (From admission, onward)   None        Note: Portions of this report may have been transcribed using voice recognition software. Every effort was made to ensure accuracy; however, inadvertent computerized transcription errors may be present.   Any transcriptional errors that result from this process are unintentional.    Adin Hector, MD, FACS, MASCRS Gastrointestinal and Minimally Invasive Surgery    [address], [place], [place] [address] [phone] Main / Paging [phone] Fax   09/26/2017

## 2017-09-27 ENCOUNTER — Encounter (HOSPITAL_COMMUNITY): Payer: Self-pay | Admitting: Orthopedic Surgery

## 2017-09-27 ENCOUNTER — Encounter (HOSPITAL_COMMUNITY)
Admission: EM | Disposition: A | Discharge status: Home / Self Care | Payer: Self-pay | Source: Home / Self Care | Attending: Family Medicine

## 2017-09-27 ENCOUNTER — Inpatient Hospital Stay (HOSPITAL_COMMUNITY): Payer: Medicare Other | Admitting: Anesthesiology

## 2017-09-27 HISTORY — PX: HEMORRHOID SURGERY: SHX153

## 2017-09-27 LAB — CBC
HCT: 25 % — ABNORMAL LOW (ref 39.0–52.0)
Hemoglobin: 7.6 g/dL — ABNORMAL LOW (ref 13.0–17.0)
MCH: 27.8 pg (ref 26.0–34.0)
MCHC: 30.4 g/dL (ref 30.0–36.0)
MCV: 91.6 fL (ref 78.0–100.0)
Platelets: 143 10*3/uL — ABNORMAL LOW (ref 150–400)
RBC: 2.73 MIL/uL — ABNORMAL LOW (ref 4.22–5.81)
RDW: 13.8 % (ref 11.5–15.5)
WBC: 4 10*3/uL (ref 4.0–10.5)

## 2017-09-27 LAB — BASIC METABOLIC PANEL
Anion gap: 9 (ref 5–15)
BUN: 16 mg/dL (ref 8–23)
CO2: 26 mmol/L (ref 22–32)
Calcium: 8.8 mg/dL — ABNORMAL LOW (ref 8.9–10.3)
Chloride: 100 mmol/L (ref 98–111)
Creatinine, Ser: 1.66 mg/dL — ABNORMAL HIGH (ref 0.61–1.24)
GFR calc Af Amer: 44 mL/min — ABNORMAL LOW (ref >60)
GFR calc non Af Amer: 38 mL/min — ABNORMAL LOW (ref >60)
Glucose, Bld: 99 mg/dL (ref 70–99)
Potassium: 4.1 mmol/L (ref 3.5–5.1)
Sodium: 135 mmol/L (ref 135–145)

## 2017-09-27 SURGERY — HEMORRHOIDECTOMY
Anesthesia: General | Site: Rectum

## 2017-09-27 MED ORDER — DIBUCAINE 1 % RE OINT
TOPICAL_OINTMENT | RECTAL | Status: DC | PRN
  Administered 2017-09-27: 1 via RECTAL

## 2017-09-27 MED ORDER — LACTATED RINGERS IV SOLN
INTRAVENOUS | Status: DC
  Administered 2017-09-27: 10:00:00 via INTRAVENOUS

## 2017-09-27 MED ORDER — OXYCODONE HCL 5 MG/5ML PO SOLN: 5.0000 mg | Freq: Once | ORAL | Status: DC | PRN

## 2017-09-27 MED ORDER — METOPROLOL SUCCINATE ER 25 MG PO TB24
100.0000 mg | ORAL_TABLET | Freq: Once | ORAL | Status: AC
  Administered 2017-09-27: 100 mg via ORAL
  Filled 2017-09-27: qty 4

## 2017-09-27 MED ORDER — DIBUCAINE 1 % RE OINT
TOPICAL_OINTMENT | RECTAL | Status: AC
  Filled 2017-09-27: qty 28

## 2017-09-27 MED ORDER — LIDOCAINE HCL (CARDIAC) PF 100 MG/5ML IV SOSY
PREFILLED_SYRINGE | INTRAVENOUS | Status: DC | PRN
  Administered 2017-09-27: 40 mg via INTRAVENOUS

## 2017-09-27 MED ORDER — ONDANSETRON HCL 4 MG/2ML IJ SOLN
INTRAMUSCULAR | Status: DC | PRN
  Administered 2017-09-27: 4 mg via INTRAVENOUS

## 2017-09-27 MED ORDER — DEXAMETHASONE SODIUM PHOSPHATE 10 MG/ML IJ SOLN
INTRAMUSCULAR | Status: DC | PRN
  Administered 2017-09-27: 10 mg via INTRAVENOUS

## 2017-09-27 MED ORDER — METRONIDAZOLE IN NACL 5-0.79 MG/ML-% IV SOLN
500.0000 mg | INTRAVENOUS | Status: AC
  Administered 2017-09-27: 500 mg via INTRAVENOUS
  Filled 2017-09-27 (×2): qty 100

## 2017-09-27 MED ORDER — LACTATED RINGERS IV SOLN
INTRAVENOUS | Status: DC | PRN
  Administered 2017-09-27: 10:00:00 via INTRAVENOUS

## 2017-09-27 MED ORDER — FENTANYL CITRATE (PF) 100 MCG/2ML IJ SOLN: 25.0000 ug | INTRAMUSCULAR | Status: DC | PRN

## 2017-09-27 MED ORDER — PROPOFOL 10 MG/ML IV BOLUS
INTRAVENOUS | Status: AC
  Filled 2017-09-27: qty 20

## 2017-09-27 MED ORDER — OXYCODONE HCL 5 MG PO TABS
5.0000 mg | ORAL_TABLET | Freq: Once | ORAL | Status: DC | PRN
Start: 2017-09-27 — End: 2017-09-27

## 2017-09-27 MED ORDER — FENTANYL CITRATE (PF) 250 MCG/5ML IJ SOLN
INTRAMUSCULAR | Status: AC
  Filled 2017-09-27: qty 5

## 2017-09-27 MED ORDER — OXYCODONE-ACETAMINOPHEN 5-325 MG PO TABS: 1.0000 | ORAL_TABLET | ORAL | Status: DC | PRN

## 2017-09-27 MED ORDER — EPHEDRINE SULFATE 50 MG/ML IJ SOLN
INTRAMUSCULAR | Status: DC | PRN
  Administered 2017-09-27 (×3): 5 mg via INTRAVENOUS

## 2017-09-27 MED ORDER — PHENYLEPHRINE HCL 10 MG/ML IJ SOLN
INTRAMUSCULAR | Status: DC | PRN
  Administered 2017-09-27 (×2): 80 ug via INTRAVENOUS

## 2017-09-27 MED ORDER — PROPOFOL 10 MG/ML IV BOLUS
INTRAVENOUS | Status: DC | PRN
  Administered 2017-09-27: 120 mg via INTRAVENOUS

## 2017-09-27 MED ORDER — FENTANYL CITRATE (PF) 100 MCG/2ML IJ SOLN
INTRAMUSCULAR | Status: DC | PRN
  Administered 2017-09-27 (×2): 25 ug via INTRAVENOUS

## 2017-09-27 MED ORDER — CHLORHEXIDINE GLUCONATE CLOTH 2 % EX PADS
6.0000 | MEDICATED_PAD | Freq: Every day | CUTANEOUS | Status: DC
  Administered 2017-09-27: 6 via TOPICAL

## 2017-09-27 MED ORDER — CEFAZOLIN SODIUM-DEXTROSE 2-4 GM/100ML-% IV SOLN
2.0000 g | INTRAVENOUS | Status: AC
  Administered 2017-09-27: 2 g via INTRAVENOUS
  Filled 2017-09-27: qty 100

## 2017-09-27 MED ORDER — OXYCODONE HCL 5 MG PO TABS: 5.0000 mg | ORAL_TABLET | Freq: Once | ORAL | Status: DC | PRN

## 2017-09-27 MED ORDER — SODIUM CHLORIDE 0.9 % IV SOLN
INTRAVENOUS | Status: DC | PRN
  Administered 2017-09-27: 20 mL

## 2017-09-27 SURGICAL SUPPLY — 43 items
BRIEF STRETCH FOR OB PAD LRG (UNDERPADS AND DIAPERS) ×1 IMPLANT
CANISTER SUCT 3000ML PPV (MISCELLANEOUS) ×2 IMPLANT
COVER SURGICAL LIGHT HANDLE (MISCELLANEOUS) ×2 IMPLANT
DECANTER SPIKE VIAL GLASS SM (MISCELLANEOUS) ×2 IMPLANT
DRAPE HALF SHEET 40X57 (DRAPES) ×2 IMPLANT
DRAPE UTILITY XL STRL (DRAPES) ×4 IMPLANT
DRSG PAD ABDOMINAL 8X10 ST (GAUZE/BANDAGES/DRESSINGS) ×2 IMPLANT
ELECT CAUTERY BLADE 6.4 (BLADE) ×2 IMPLANT
ELECT REM PT RETURN 9FT ADLT (ELECTROSURGICAL) ×2
ELECTRODE REM PT RTRN 9FT ADLT (ELECTROSURGICAL) ×1 IMPLANT
GAUZE SPONGE 4X4 12PLY STRL (GAUZE/BANDAGES/DRESSINGS) ×2 IMPLANT
GAUZE SPONGE 4X4 16PLY XRAY LF (GAUZE/BANDAGES/DRESSINGS) ×2 IMPLANT
GLOVE BIO SURGEON STRL SZ8 (GLOVE) ×2 IMPLANT
GLOVE BIOGEL PI IND STRL 8 (GLOVE) ×1 IMPLANT
GLOVE BIOGEL PI INDICATOR 8 (GLOVE) ×1
GOWN STRL REUS W/ TWL LRG LVL3 (GOWN DISPOSABLE) ×2 IMPLANT
GOWN STRL REUS W/ TWL XL LVL3 (GOWN DISPOSABLE) ×1 IMPLANT
GOWN STRL REUS W/TWL LRG LVL3 (GOWN DISPOSABLE) ×4
GOWN STRL REUS W/TWL XL LVL3 (GOWN DISPOSABLE) ×2
HEMOSTAT SURGICEL 2X14 (HEMOSTASIS) ×2 IMPLANT
KIT BASIN OR (CUSTOM PROCEDURE TRAY) ×2 IMPLANT
KIT TURNOVER KIT B (KITS) ×2 IMPLANT
NDL HYPO 25GX1X1/2 BEV (NEEDLE) ×1 IMPLANT
NEEDLE HYPO 25GX1X1/2 BEV (NEEDLE) ×2 IMPLANT
NS IRRIG 1000ML POUR BTL (IV SOLUTION) ×2 IMPLANT
PACK LITHOTOMY IV (CUSTOM PROCEDURE TRAY) ×2 IMPLANT
PAD ARMBOARD 7.5X6 YLW CONV (MISCELLANEOUS) ×4 IMPLANT
PENCIL BUTTON HOLSTER BLD 10FT (ELECTRODE) ×2 IMPLANT
SHEARS HARMONIC 9CM CVD (BLADE) ×2 IMPLANT
SPECIMEN JAR SMALL (MISCELLANEOUS) ×2 IMPLANT
SPONGE HEMORRHOID 8X3CM (HEMOSTASIS) ×1 IMPLANT
SPONGE SURGIFOAM ABS GEL 12-7 (HEMOSTASIS) ×2 IMPLANT
SURGILUBE 2OZ TUBE FLIPTOP (MISCELLANEOUS) ×2 IMPLANT
SUT CHROMIC 2 0 SH (SUTURE) IMPLANT
SUT MON AB 3-0 SH 27 (SUTURE) ×2
SUT MON AB 3-0 SH27 (SUTURE) ×1 IMPLANT
SYR CONTROL 10ML LL (SYRINGE) ×2 IMPLANT
TOWEL OR 17X24 6PK STRL BLUE (TOWEL DISPOSABLE) ×2 IMPLANT
TOWEL OR 17X26 10 PK STRL BLUE (TOWEL DISPOSABLE) ×2 IMPLANT
TRAY PROCTOSCOPIC FIBER OPTIC (SET/KITS/TRAYS/PACK) IMPLANT
TUBE CONNECTING 12X1/4 (SUCTIONS) ×2 IMPLANT
UNDERPAD 30X30 (UNDERPADS AND DIAPERS) ×2 IMPLANT
YANKAUER SUCT BULB TIP NO VENT (SUCTIONS) ×2 IMPLANT

## 2017-09-27 NOTE — Progress Notes (Signed)
PROGRESS NOTE  [name]  TMH:[id number] DOB: 1935-09-08 DOA: 09/21/2017 PCP: Dineen Kid, MD   Brief Narrative: [name] is a 82 y.o. male with a history of AFib on xarelto, CAD s/p PCI in June 2018, and sigmoid diverticulosis on colonoscopy 2016 and recent hematuria who presented to the ED with 2 weeks of GI bleeding, bloody BMs in setting of constipation. Hgb noted to be down to 9 from baseline of 11. He was admitted and GI consulted. Xarelto has been held and bleeding is ongoing. GI performed colonoscopy 7/29 showing bleeding internal hemorrhoids, otherwise normal mucosa. General surgery has been consulted per GI recommendations and plans for hemorrhoidal ligation and possible hemorrhoidectomy prior to discharge. Hgb has trended slowly downward but has not required a transfusion as of yet.   Assessment & Plan: Principal Problem:   Acute lower GI bleeding Active Problems:   Post PTCA   Atrial flutter (HCC)   Acute blood loss anemia   Acute kidney injury superimposed on CKD (HCC)   Internal bleeding hemorrhoids   CKD (chronic kidney disease) stage 3, GFR 30-59 ml/min (HCC)   Chronic anticoagulation   Atrial fibrillation (HCC)  Acute lower GI bleeding with acute blood loss anemia: Followed by Dr. Watt Climes as outpatient, previously reported diverticulosis. Colonoscopy 7/29 showed normal mucosa, an isolated right sided diverticulum and bleeding internal hemorrhoids, the suspected source. Still bleeding despite off anticoagulation, has been bleeding for nearly 3 weeks. - General surgery consulted and performed 3- column internal hemorrhoidectomy 7/31.  OF NOTE, per the op note: "Of note he had a significant amount of dark blood which was not from his hemorrhoidal basin in the rectum which I irrigated and suctioned out.  There is no further bleeding that I can see.  He had grade 2  Three column  minimal hemorrhoid disease but to these areas were using a little bit.  I used  local anesthetic for an anal block.  I then used a harmonic scalpel and trimmed all 3 columns.  Again he had significant bleeding above this which appeared old and may require further GI work-up since I do not feel the hemorrhoid disease responsible for this." - Question for GI: ?Indication for EGD or capsule endoscopy - Recheck CBC in AM. Discussed with patient indications for transfusion, prefers to hold off today and reevaluate based on hgb and amount of clinical bleeding tomorrow AM. - Continue to hold anticoagulation  Persistent AFib:  - Rate is controlled, continue metoprolol - Holding xarelto as above. CHA2DS2-VASc is 4.   CAD s/p PCI, HTN: Seen by Dr. Virgina Jock, recently taken off aspirin, formerly taken off plavix following PCI in June 2018. EF 50-55% in Nov 2018. - Will continue to hold antiplatelets as these were discontinued as outpatient. - Monitor for evidence of ACS, none currently.  - Continue metoprolol, ARB, statin, norvasc, triamterene, HCTZ.  AKI on stage III CKD: Baseline creatinine likely 1.5-1.6, up to 2.13 on admission. Likely prerenal, improved with fluids.  - Avoid nephrotoxins  Parkinson's disease: Dx late 2018, followed by Dr. Carles Collet. No worsening of symptoms noted. - Continue sinemet at home dosing. D/w pharmacy.  History of prostate CA Tx w/brachytherapy and recent hematuria:  - Follow up with urology.   Indirect hyperbilirubinemia: Mild, not felt to be due to hemolysis. Possible gilbert's. RUQ U/S benign, h/o cholecystectomy. - Monitor  DVT prophylaxis: SCDs Code Status: Full Family Communication: None at bedside this AM Disposition Plan: Home once work up complete and patient stable.  Consultants:   [facility]  General surgery  Procedures:   Colonoscopy 09/25/2017 by Dr. Oletta Lamas:  Findings:      The perianal exam findings include bright red blood on the examining       finger.      Normal mucosa was found in the sigmoid colon, in the descending  colon       and in the right colon. there was no bleeding or clots in this part of       the colon. The prep was not adequate to rule out small polyps but no       gross masses were saying.      Red blood was found in the rectum. the rectum was vigorously irrigated       no active bleeding was seen except in the retroflex view right around       the anal area where he had internal hemorrhoids. Impression:       - Preparation of the colon was inadequate.                           - Bright red blood on the examining finger found on                            perianal exam. this is consistent with bleeding                            internal hemorrhoids                           - Normal mucosa in the sigmoid colon, in the                            descending colon and in the right colon.                           - Blood in the rectum.                           - No specimens collected. Recommendation: Clear liquid diet.                           - Refer to a colo-rectal surgeon at appointment to                            be scheduled.                           - Repeat colonoscopy for surveillance. this patient                            has had polyps removed in the prep for this                            colonoscopy was not adequate to rule out polyps  09/25/17 COLONOSCOPY WITH PROPOFOL Laurence Spates, MD  09/27/17 HEMORRHOIDECTOMY Erroll Luna, MD   Antimicrobials:  None   Subjective: Continues to have  bleeding with BMs and staining on pads. Denies chest pain, dyspnea. Has intermittent dizziness which preceded bleeding. Does not want to get a transfusion if he can avoid it - not for religious reasons specifically.   Objective: Vitals:   09/27/17 1215 09/27/17 1230 09/27/17 1245 09/27/17 1300  BP: (!) 99/57 121/70 112/70 122/67  Pulse: 67 76 63 62  Resp: 13 17 14 15   Temp: 98 F (36.7 C)     TempSrc:      SpO2: 100% 100% 100% 100%  Weight:      Height:         Intake/Output Summary (Last 24 hours) at 09/27/2017 1346 Last data filed at 09/27/2017 1210 Gross per 24 hour  Intake 950 ml  Output 20 ml  Net 930 ml   Filed Weights   09/21/17 0913 09/21/17 1306 09/27/17 1019  Weight: 95.3 kg (210 lb) 94.2 kg (207 lb 10.8 oz) 94.2 kg (207 lb 10.8 oz)   Gen: 82 y.o. male in no distress Pulm: Nonlabored breathing room air. Clear. CV: Regular rate and rhythm. II/VI apical systolic murmur, rub, or gallop. No JVD, no significant dependent edema. GI: Abdomen soft, non-tender, non-distended, with normoactive bowel sounds.  Ext: Warm, no deformities Skin: No rashes, lesions or ulcers on visualized skin.  Neuro: Alert and oriented. No focal neurological deficits, dyskinesia, bradykinesia. Psych: Judgement and insight appear fair. Mood euthymic & affect congruent. Behavior is appropriate.    Data Reviewed: I have personally reviewed following labs and imaging studies  CBC: Recent Labs  Lab 09/21/17 0931  09/24/17 1210 09/24/17 2058 09/25/17 0524 09/26/17 0550 09/27/17 0308  WBC 4.5   < > 4.7 4.6 3.5* 3.4* 4.0  NEUTROABS 3.1  --   --   --   --   --   --   HGB 9.7*   < > 8.9* 8.5* 8.0* 8.0* 7.6*  HCT 31.5*   < > 29.3* 28.2* 26.2* 25.7* 25.0*  MCV 91.8   < > 93.3 93.7 92.6 90.8 91.6  PLT 151   < > 158 150 126* 133* 143*   < > = values in this interval not displayed.   Basic Metabolic Panel: Recent Labs  Lab 09/22/17 0409 09/23/17 0312 09/24/17 0643 09/26/17 0550 09/27/17 0308  NA 138 137 138 136 135  K 4.1 4.5 4.5 4.0 4.1  CL 100 102 103 104 100  CO2 29 26 27 27 26   GLUCOSE 97 95 88 88 99  BUN 28* 23 21 15 16   CREATININE 1.85* 1.65* 1.59* 1.50* 1.66*  CALCIUM 8.9 8.8* 8.9 8.8* 8.8*   GFR: Estimated Creatinine Clearance: 42.3 mL/min (A) (by C-G formula based on SCr of 1.66 mg/dL (H)). Liver Function Tests: Recent Labs  Lab 09/21/17 0931 09/23/17 0312 09/25/17 0524  AST 24 22  --   ALT 5 7  --   ALKPHOS 101 83  --   BILITOT  2.3* 2.1* 2.2*  PROT 6.9 5.7*  --   ALBUMIN 4.0 3.1*  --    Coagulation Profile: Recent Labs  Lab 09/21/17 0931 09/24/17 0643  INR 1.87 1.31   Radiology Studies: No results found.  Scheduled Meds: . amLODipine  5 mg Oral Daily  . carbidopa-levodopa  2 tablet Oral 2 times per day   And  . carbidopa-levodopa  1 tablet Oral Q1500  . Chlorhexidine Gluconate Cloth  6 each Topical Q0600  . cholecalciferol  1,000 Units Oral Daily  . losartan  25 mg Oral QPM  .  metoprolol succinate  100 mg Oral q morning - 10a  . pantoprazole  40 mg Oral Daily  . polyethylene glycol  17 g Oral BID  . rosuvastatin  10 mg Oral QHS  . tamsulosin  0.4 mg Oral QHS  . triamterene-hydrochlorothiazide  1 tablet Oral Daily   Continuous Infusions: . lactated ringers 10 mL/hr at 09/27/17 1020     LOS: 5 days   Time spent: 25 minutes.  Patrecia Pour, MD [facility] [email or web address] Password [email or web address] 09/27/2017, 1:46 PM

## 2017-09-27 NOTE — Anesthesia Preprocedure Evaluation (Addendum)
Anesthesia Evaluation  Patient identified by MRN, date of birth, ID band Patient awake    Reviewed: Allergy & Precautions, NPO status , Patient's Chart, lab work & pertinent test results  History of Anesthesia Complications Negative for: history of anesthetic complications  Airway Mallampati: II  TM Distance: >3 FB Neck ROM: Full    Dental  (+) Dental Advisory Given, Teeth Intact   Pulmonary neg pulmonary ROS,    breath sounds clear to auscultation       Cardiovascular hypertension, Pt. on home beta blockers and Pt. on medications + CAD  + dysrhythmias Atrial Fibrillation + Valvular Problems/Murmurs AS  Rhythm:Regular Rate:Normal     Neuro/Psych negative neurological ROS     GI/Hepatic negative GI ROS, Neg liver ROS,   Endo/Other    Renal/GU Renal InsufficiencyRenal disease     Musculoskeletal  (+) Arthritis , Osteoarthritis,    Abdominal Normal abdominal exam  (+)   Peds  Hematology  (+) anemia ,   Anesthesia Other Findings - Parkinson's Disease  Reproductive/Obstetrics                             Anesthesia Physical Anesthesia Plan  ASA: III  Anesthesia Plan: General   Post-op Pain Management:    Induction: Intravenous  PONV Risk Score and Plan: 2 and Ondansetron and Treatment may vary due to age or medical condition  Airway Management Planned: LMA and Oral ETT  Additional Equipment: None  Intra-op Plan:   Post-operative Plan: Extubation in OR  Informed Consent: I have reviewed the patients History and Physical, chart, labs and discussed the procedure including the risks, benefits and alternatives for the proposed anesthesia with the patient or authorized representative who has indicated his/her understanding and acceptance.   Dental advisory given  Plan Discussed with: CRNA and Surgeon  Anesthesia Plan Comments:        Anesthesia Quick Evaluation

## 2017-09-27 NOTE — Op Note (Signed)
Preoperative diagnosis : lower GI bleeding  Postoperative diagnosis : grade 2-3 column internal hemorrhoid disease with minimal bleeding  Procedure: Examined rest anesthesia with anoscopy and 3 column internal hemorrhoidectomy  Surgeon: Erroll Luna, MD  Anesthesia: LMA with exparel local  Specimen: hemorroidal  tissue to pathology  IV fluids: Per anesthesia record  EBL: 20 cc  Indications for procedure: The patient is a 82 year old male admitted with rectal bleeding and hemorrhoid disease.  Underwent colonoscopy for evaluation of his lower GI bleeding was found to have hemorrhoid disease.  He has had a history of intermittent anemia from this and lower GI bleeding for a number weeks.  Colonoscopy did not reveal lesion.  We offered exam under anesthesia and potential hemorrhoidectomy for rectal bleeding.  Risk, benefits and long-term expectations discussed.  Risk of bleeding, infection, recurrence, injury to sphincter, incontinence, injury to neighboring structures, were discussed.  We also discussed that this may not stop his bleeding if this is not the source.  He understood the above but given his hemorrhoid disease and history of bleeding wished to proceed.  Description of procedure: The patient was met in the holding area and questions were answered.  He was taken back to the operative room.  He was placed supine on the operating table.  After induction of general anesthesia he was placed in lithotomy and padded appropriately.  He had significant dark blood during the prep from his rectum.  This appeared old and not an acute source.  He was cleaned up and then prepped and draped formally in a sterile fashion.  Timeout was done.  He received preoperative Ancef and Flagyl.  Digital examination was performed was normal.  There is significant blood on digital exam.  Anoscope was placed.  Of note he had a significant amount of dark blood which was not from his hemorrhoidal basin in the rectum  which I irrigated and suctioned out.  There is no further bleeding that I can see.  He had grade 2  Three column  minimal hemorrhoid disease but to these areas were using a little bit.  I used local anesthetic for an anal block.  I then used a harmonic scalpel and trimmed all 3 columns.  Again he had significant bleeding above this which appeared old and may require further GI work-up since I do not feel the hemorrhoid disease responsible for this.  This was treated adequately with good hemostasis.  I observed and saw no bleeding from 3 columns that were excised.  Irrigation was used to ensure adequate hemostasis.  Rectal packing placed.  All final counts found to be correct.  The patient was taken lithotomy extubated taken recovery in satisfactory condition.

## 2017-09-27 NOTE — Interval H&P Note (Signed)
History and Physical Interval Note:  09/27/2017 10:55 AM  [name]  has presented today for surgery, with the diagnosis of hemorrhoidectomy  The various methods of treatment have been discussed with the patient and family. After consideration of risks, benefits and other options for treatment, the patient has consented to  Procedure(s): HEMORRHOIDECTOMY (N/A) as a surgical intervention .  The patient's history has been reviewed, patient examined, no change in status, stable for surgery.  I have reviewed the patient's chart and labs.  Questions were answered to the patient's satisfaction.     [place]

## 2017-09-27 NOTE — Anesthesia Procedure Notes (Signed)
Procedure Name: LMA Insertion Date/Time: 09/27/2017 11:08 AM Performed by: Neldon Newport, CRNA Pre-anesthesia Checklist: Timeout performed, Patient being monitored, Suction available, Emergency Drugs available and Patient identified Patient Re-evaluated:Patient Re-evaluated prior to induction Oxygen Delivery Method: Circle system utilized Preoxygenation: Pre-oxygenation with 100% oxygen Induction Type: IV induction Ventilation: Mask ventilation without difficulty LMA: LMA inserted LMA Size: 5.0 Placement Confirmation: positive ETCO2 and breath sounds checked- equal and bilateral

## 2017-09-27 NOTE — Transfer of Care (Signed)
Immediate Anesthesia Transfer of Care Note  Patient: [name]  Procedure(s) Performed: HEMORRHOIDECTOMY (N/A Rectum)  Patient Location: PACU  Anesthesia Type:General  Level of Consciousness: awake, alert  and oriented  Airway & Oxygen Therapy: Patient Spontanous Breathing and Patient connected to nasal cannula oxygen  Post-op Assessment: Report given to RN, Post -op Vital signs reviewed and stable and Patient moving all extremities X 4  Post vital signs: Reviewed and stable  Last Vitals:  Vitals Value Taken Time  BP 99/57 09/27/2017 12:09 PM  Temp    Pulse 73 09/27/2017 12:10 PM  Resp 32 09/27/2017 12:10 PM  SpO2 99 % 09/27/2017 12:10 PM  Vitals shown include unvalidated device data.  Last Pain:  Vitals:   09/27/17 0624  TempSrc: Oral  PainSc:       Patients Stated Pain Goal: 0 (21/58/72 7618)  Complications: No apparent anesthesia complications

## 2017-09-28 ENCOUNTER — Encounter (HOSPITAL_COMMUNITY): Payer: Self-pay | Admitting: Surgery

## 2017-09-28 DIAGNOSIS — N183 Chronic kidney disease, stage 3 (moderate): Secondary | ICD-10-CM

## 2017-09-28 DIAGNOSIS — K922 Gastrointestinal hemorrhage, unspecified: Secondary | ICD-10-CM

## 2017-09-28 DIAGNOSIS — N189 Chronic kidney disease, unspecified: Secondary | ICD-10-CM

## 2017-09-28 DIAGNOSIS — I482 Chronic atrial fibrillation: Secondary | ICD-10-CM

## 2017-09-28 DIAGNOSIS — N179 Acute kidney failure, unspecified: Secondary | ICD-10-CM

## 2017-09-28 LAB — CBC
HCT: 25.2 % — ABNORMAL LOW (ref 39.0–52.0)
Hemoglobin: 7.8 g/dL — ABNORMAL LOW (ref 13.0–17.0)
MCH: 27.9 pg (ref 26.0–34.0)
MCHC: 31 g/dL (ref 30.0–36.0)
MCV: 90 fL (ref 78.0–100.0)
Platelets: 146 10*3/uL — ABNORMAL LOW (ref 150–400)
RBC: 2.8 MIL/uL — ABNORMAL LOW (ref 4.22–5.81)
RDW: 13.6 % (ref 11.5–15.5)
WBC: 5.6 10*3/uL (ref 4.0–10.5)

## 2017-09-28 NOTE — Progress Notes (Addendum)
Patient ID: [name], male   DOB: December 23, 1935, 82 y.o.   MRN: [id number]    1 Day Post-Op  Subjective: No pain today.  Isn't aware of any further bleeding at this time.  Objective: Vital signs in last 24 hours: Temp:  [97.6 F (36.4 C)-98.1 F (36.7 C)] 98.1 F (36.7 C) (08/01 0540) Pulse Rate:  [55-76] 65 (08/01 0944) Resp:  [13-17] 17 (07/31 1510) BP: (93-122)/(52-73) 115/73 (08/01 0944) SpO2:  [97 %-100 %] 97 % (08/01 0540) Last BM Date: 09/27/17  Intake/Output from previous day: 07/31 0701 - 08/01 0700 In: 1136.7 [P.O.:120; I.V.:816.7; IV Piggyback:200] Out: 170 [Urine:150; Blood:20] Intake/Output this shift: No intake/output data recorded.  PE: Abd: soft, NT, ND Rectum: no bleeding  Lab Results:  Recent Labs    09/27/17 0308 09/28/17 0321  WBC 4.0 5.6  HGB 7.6* 7.8*  HCT 25.0* 25.2*  PLT 143* 146*   BMET Recent Labs    09/26/17 0550 09/27/17 0308  NA 136 135  K 4.0 4.1  CL 104 100  CO2 27 26  GLUCOSE 88 99  BUN 15 16  CREATININE 1.50* 1.66*  CALCIUM 8.8* 8.8*   PT/INR No results for input(s): LABPROT, INR in the last 72 hours. CMP     Component Value Date/Time   NA 135 09/27/2017 0308   K 4.1 09/27/2017 0308   CL 100 09/27/2017 0308   CO2 26 09/27/2017 0308   GLUCOSE 99 09/27/2017 0308   BUN 16 09/27/2017 0308   CREATININE 1.66 (H) 09/27/2017 0308   CALCIUM 8.8 (L) 09/27/2017 0308   PROT 5.7 (L) 09/23/2017 0312   ALBUMIN 3.1 (L) 09/23/2017 0312   AST 22 09/23/2017 0312   ALT 7 09/23/2017 0312   ALKPHOS 83 09/23/2017 0312   BILITOT 2.2 (H) 09/25/2017 0524   GFRNONAA 38 (L) 09/27/2017 0308   GFRAA 44 (L) 09/27/2017 0308   Lipase  No results found for: LIPASE     Studies/Results: No results found.  Anti-infectives: Anti-infectives (From admission, onward)   Start     Dose/Rate Route Frequency Ordered Stop   09/27/17 1030  ceFAZolin (ANCEF) IVPB 2g/100 mL premix     2 g 200 mL/hr over 30 Minutes Intravenous On  call to O.R. 09/27/17 0959 09/27/17 1112   09/27/17 1030  metroNIDAZOLE (FLAGYL) IVPB 500 mg     500 mg 100 mL/hr over 60 Minutes Intravenous On call to O.R. 09/27/17 1004 09/27/17 1144   09/26/17 0830  ceFAZolin (ANCEF) IVPB 2g/100 mL premix     2 g 200 mL/hr over 30 Minutes Intravenous On call to O.R. 09/26/17 0829 09/27/17 0559   09/26/17 0830  metroNIDAZOLE (FLAGYL) IVPB 500 mg     500 mg 100 mL/hr over 60 Minutes Intravenous On call to O.R. 09/26/17 2703 09/27/17 0559       Assessment/Plan Internal hemorrhoids POD1, s/p column 3 hemorrhoidectomy of Grade 2 hemorrhoids. -hgb stable -no current bleeding -patient already had a c-scope that did not identify any other source of his bleeding.  If he were to rebleed, he may need a bleeding scan to determine etiology as his hemorrhoids have been removed, but were also not that conspicuous to think they are the sole source of his bleeding.  He may benefit from EGD as well. -cont on stool softeners. -no further surgical intervention or plans.  We will sign off.  FEN - regular VTE - SCDs ID - none  Follow up with [facility] as needed  LOS: 6 days    Henreitta Cea , [facility] 09/28/2017, 12:05 PM Pager: [phone]

## 2017-09-28 NOTE — Anesthesia Postprocedure Evaluation (Signed)
Anesthesia Post Note  Patient: [name]  Procedure(s) Performed: HEMORRHOIDECTOMY (N/A Rectum)     Patient location during evaluation: PACU Anesthesia Type: General Level of consciousness: awake and alert Pain management: pain level controlled Vital Signs Assessment: post-procedure vital signs reviewed and stable Respiratory status: spontaneous breathing, nonlabored ventilation, respiratory function stable and patient connected to nasal cannula oxygen Cardiovascular status: blood pressure returned to baseline and stable Postop Assessment: no apparent nausea or vomiting Anesthetic complications: no    Last Vitals:  Vitals:   09/28/17 0944 09/28/17 1526  BP: 115/73 (!) 91/53  Pulse: 65 67  Resp:  17  Temp:  36.7 C  SpO2:  97%    Last Pain:  Vitals:   09/28/17 1526  TempSrc: Oral  PainSc:                  [name]

## 2017-09-28 NOTE — Discharge Instructions (Signed)
Hemorrhoids    Hemorrhoids are swollen veins in and around the rectum or anus. Hemorrhoids can cause pain, itching, or bleeding. Most of the time, they do not cause serious problems. They usually get better with diet changes, lifestyle changes, and other home treatments.  Follow these instructions at home:  Eating and drinking  · Eat foods that have fiber, such as whole grains, beans, nuts, fruits, and vegetables. Ask your doctor about taking products that have added fiber (fiber supplements).  · Drink enough fluid to keep your pee (urine) clear or pale yellow.  For Pain and Swelling  · Take a warm-water bath (sitz bath) for 20 minutes to ease pain. Do this 3-4 times a day.  · If directed, put ice on the painful area. It may be helpful to use ice between your warm baths.  ¨ Put ice in a plastic bag.  ¨ Place a towel between your skin and the bag.  ¨ Leave the ice on for 20 minutes, 2-3 times a day.  General instructions  · Take over-the-counter and prescription medicines only as told by your doctor.  ¨ Medicated creams and medicines that are inserted into the anus (suppositories) may be used or applied as told.  · Exercise often.  · Go to the bathroom when you have the urge to poop (to have a bowel movement). Do not wait.  · Avoid pushing too hard (straining) when you poop.  · Keep the butt area dry and clean. Use wet toilet paper or moist paper towels.  · Do not sit on the toilet for a long time.  Contact a doctor if:  · You have any of these:  ¨ Pain and swelling that do not get better with treatment or medicine.  ¨ Bleeding that will not stop.  ¨ Trouble pooping or you cannot poop.  ¨ Pain or swelling outside the area of the hemorrhoids.  This information is not intended to replace advice given to you by your health care provider. Make sure you discuss any questions you have with your health care provider.  Document Released: 11/24/2007 Document Revised: 07/23/2015 Document Reviewed: 10/29/2014  [employer]vier  Interactive Patient Education © 2018 [employer]   

## 2017-09-28 NOTE — Progress Notes (Signed)
PROGRESS NOTE    [name]  ZWC:[id number] DOB: November 11, 1935 DOA: 09/21/2017 PCP: Dineen Kid, MD   Brief Narrative: [name] is a 82 y.o. male with a history of AFib on xarelto, CAD s/p PCI in June 2018, and sigmoid diverticulosis on colonoscopy 2016 and recent hematuria who presented to the ED with 2 weeks of GI bleeding, bloody BMs in setting of constipation. Hgb noted to be down to 9 from baseline of 11. He was admitted and GI consulted. Xarelto has been held and bleeding is ongoing. GI performed colonoscopy 7/29 showing bleeding internal hemorrhoids, otherwise normal mucosa. General surgery has been consulted per GI recommendations and plans for hemorrhoidal ligation and possible hemorrhoidectomy prior to discharge. Hgb has trended slowly downward but has not required a transfusion as of yet.     Assessment & Plan:   Principal Problem:   Acute lower GI bleeding Active Problems:   Post PTCA   Atrial flutter (HCC)   Acute blood loss anemia   Acute kidney injury superimposed on CKD (HCC)   Internal bleeding hemorrhoids   CKD (chronic kidney disease) stage 3, GFR 30-59 ml/min (HCC)   Chronic anticoagulation   Atrial fibrillation (HCC)   Acute GI bleed / aacute blood loss anemia:  Suspect from diverticulosis vs hemorrhoids.  Underwent colonoscopy showing a bleeding internal hemorrhoids.  Surgery consulted and he underwent hemorrhoidectomy.  No further bleeding episodes.  Discussed with Dr Oletta Lamas regarding old blood seen during the procedure, he did not recommend an EGd at this time. Suggests its probably old blood from the hemorrhoidal bleed.   Transfuse to keep hemoglobin greater than 7.  Continue to hold anti coagulation.    Atrial fibrillation: rater controlled on metoprolol.   CAD s/p PCI, HTN: no chest pain or sob.  Resume BB, ARB, statin.    AKI on stage 3 CKD: Improving with hydration.    H/o Parkinson's disease:  Resume home meds.     History of prostate CA Tx w/brachytherapy and recent hematuria:  - Follow up with urology.   Indirect hyperbilirubinemia: Mild, not felt to be due to hemolysis. Possible gilbert's. RUQ U/S benign, h/o cholecystectomy. - Monitor      DVT prophylaxis: scd's Code Status: full code.  Family Communication: none at bedside  Disposition Plan: possibly d/c tomorrow If no bleeding.  Consultants:   Surgery   Gastroenterology.    Procedures:Hemorrhoidectomy    Antimicrobials: none.    Subjective: NO BLEEDING OVERNIGHT.   Objective: Vitals:   09/27/17 2134 09/28/17 0540 09/28/17 0944 09/28/17 1526  BP: 93/63 (!) 100/52 115/73 (!) 91/53  Pulse: 66 (!) 55 65 67  Resp:    17  Temp: 97.8 F (36.6 C) 98.1 F (36.7 C)  98 F (36.7 C)  TempSrc: Oral Oral  Oral  SpO2: 98% 97%  97%  Weight:      Height:        Intake/Output Summary (Last 24 hours) at 09/28/2017 1640 Last data filed at 09/27/2017 2300 Gross per 24 hour  Intake 186.67 ml  Output 50 ml  Net 136.67 ml   Filed Weights   09/21/17 0913 09/21/17 1306 09/27/17 1019  Weight: 95.3 kg (210 lb) 94.2 kg (207 lb 10.8 oz) 94.2 kg (207 lb 10.8 oz)    Examination:  General exam: Appears calm and comfortable  Respiratory system: Clear to auscultation. Respiratory effort normal. Cardiovascular system: S1 & S2 heard, RRR. No JVD, murmurs, rubs, gallops or clicks. No pedal edema. Gastrointestinal  system: Abdomen is nondistended, soft and nontender. No organomegaly or masses felt. Normal bowel sounds heard. Central nervous system: Alert and oriented. No focal neurological deficits. Extremities: Symmetric 5 x 5 power. Skin: No rashes, lesions or ulcers Psychiatry: Judgement and insight appear normal. Mood & affect appropriate.     Data Reviewed: I have personally reviewed following labs and imaging studies  CBC: Recent Labs  Lab 09/24/17 2058 09/25/17 0524 09/26/17 0550 09/27/17 0308 09/28/17 0321  WBC 4.6  3.5* 3.4* 4.0 5.6  HGB 8.5* 8.0* 8.0* 7.6* 7.8*  HCT 28.2* 26.2* 25.7* 25.0* 25.2*  MCV 93.7 92.6 90.8 91.6 90.0  PLT 150 126* 133* 143* 300*   Basic Metabolic Panel: Recent Labs  Lab 09/22/17 0409 09/23/17 0312 09/24/17 0643 09/26/17 0550 09/27/17 0308  NA 138 137 138 136 135  K 4.1 4.5 4.5 4.0 4.1  CL 100 102 103 104 100  CO2 29 26 27 27 26   GLUCOSE 97 95 88 88 99  BUN 28* 23 21 15 16   CREATININE 1.85* 1.65* 1.59* 1.50* 1.66*  CALCIUM 8.9 8.8* 8.9 8.8* 8.8*   GFR: Estimated Creatinine Clearance: 42.3 mL/min (A) (by C-G formula based on SCr of 1.66 mg/dL (H)). Liver Function Tests: Recent Labs  Lab 09/23/17 0312 09/25/17 0524  AST 22  --   ALT 7  --   ALKPHOS 83  --   BILITOT 2.1* 2.2*  PROT 5.7*  --   ALBUMIN 3.1*  --    No results for input(s): LIPASE, AMYLASE in the last 168 hours. No results for input(s): AMMONIA in the last 168 hours. Coagulation Profile: Recent Labs  Lab 09/24/17 0643  INR 1.31   Cardiac Enzymes: No results for input(s): CKTOTAL, CKMB, CKMBINDEX, TROPONINI in the last 168 hours. BNP (last 3 results) No results for input(s): PROBNP in the last 8760 hours. HbA1C: No results for input(s): HGBA1C in the last 72 hours. CBG: No results for input(s): GLUCAP in the last 168 hours. Lipid Profile: No results for input(s): CHOL, HDL, LDLCALC, TRIG, CHOLHDL, LDLDIRECT in the last 72 hours. Thyroid Function Tests: No results for input(s): TSH, T4TOTAL, FREET4, T3FREE, THYROIDAB in the last 72 hours. Anemia Panel: No results for input(s): VITAMINB12, FOLATE, FERRITIN, TIBC, IRON, RETICCTPCT in the last 72 hours. Sepsis Labs: No results for input(s): PROCALCITON, LATICACIDVEN in the last 168 hours.  No results found for this or any previous visit (from the past 240 hour(s)).       Radiology Studies: No results found.      Scheduled Meds: . amLODipine  5 mg Oral Daily  . carbidopa-levodopa  2 tablet Oral 2 times per day   And  .  carbidopa-levodopa  1 tablet Oral Q1500  . Chlorhexidine Gluconate Cloth  6 each Topical Q0600  . cholecalciferol  1,000 Units Oral Daily  . losartan  25 mg Oral QPM  . metoprolol succinate  100 mg Oral q morning - 10a  . pantoprazole  40 mg Oral Daily  . polyethylene glycol  17 g Oral BID  . rosuvastatin  10 mg Oral QHS  . tamsulosin  0.4 mg Oral QHS  . triamterene-hydrochlorothiazide  1 tablet Oral Daily   Continuous Infusions: . lactated ringers 10 mL/hr at 09/27/17 1822     LOS: 6 days    Time spent: 35 MINUTES.     Hosie Poisson, MD [facility] Pager [phone]   If 7PM-7AM, please contact night-coverage [email or web address] Password [email or web address] 09/28/2017, 4:40 PM

## 2017-09-29 DIAGNOSIS — K648 Other hemorrhoids: Secondary | ICD-10-CM

## 2017-09-29 DIAGNOSIS — D62 Acute posthemorrhagic anemia: Secondary | ICD-10-CM

## 2017-09-29 LAB — CBC
HCT: 24.2 % — ABNORMAL LOW (ref 39.0–52.0)
Hemoglobin: 7.5 g/dL — ABNORMAL LOW (ref 13.0–17.0)
MCH: 27.9 pg (ref 26.0–34.0)
MCHC: 31 g/dL (ref 30.0–36.0)
MCV: 90 fL (ref 78.0–100.0)
Platelets: 140 10*3/uL — ABNORMAL LOW (ref 150–400)
RBC: 2.69 MIL/uL — ABNORMAL LOW (ref 4.22–5.81)
RDW: 13.8 % (ref 11.5–15.5)
WBC: 5.7 10*3/uL (ref 4.0–10.5)

## 2017-09-29 MED ORDER — SENNOSIDES-DOCUSATE SODIUM 8.6-50 MG PO TABS
2.0000 | ORAL_TABLET | Freq: Two times a day (BID) | ORAL | Status: DC
  Administered 2017-09-29: 2 via ORAL
  Filled 2017-09-29: qty 2

## 2017-09-29 MED ORDER — SENNOSIDES-DOCUSATE SODIUM 8.6-50 MG PO TABS
2.0000 | ORAL_TABLET | Freq: Two times a day (BID) | ORAL | 0 refills | Status: DC
Start: 2017-09-29 — End: 2018-01-19

## 2017-09-29 MED ORDER — SENNOSIDES-DOCUSATE SODIUM 8.6-50 MG PO TABS: 2.0000 | ORAL_TABLET | Freq: Two times a day (BID) | ORAL | 0 refills | Status: DC

## 2017-09-29 MED ORDER — RIVAROXABAN 15 MG PO TABS: 15.0000 mg | ORAL_TABLET | Freq: Every day | ORAL | 0 refills | Status: DC

## 2017-09-29 MED ORDER — PANTOPRAZOLE SODIUM 40 MG PO TBEC: 40.0000 mg | DELAYED_RELEASE_TABLET | Freq: Every day | ORAL | 0 refills | Status: DC

## 2017-09-29 MED ORDER — POLYETHYLENE GLYCOL 3350 17 G PO PACK: 17.0000 g | PACK | Freq: Two times a day (BID) | ORAL | 0 refills | Status: DC

## 2017-09-29 NOTE — Evaluation (Signed)
Physical Therapy Evaluation Patient Details Name: [name] MRN: [id number] DOB: Feb 25, 1936 Today's Date: 09/29/2017   History of Present Illness   82 y.o. male with a history of AFib on xarelto, CAD s/p PCI in June 2018, and sigmoid diverticulosis on colonoscopy 2016 and recent hematuria who presented to the ED with 2 weeks of GI bleeding, bloody BMs in setting of constipation. Hgb noted to be down to 9 from baseline of 11. pt s/p HEMORRHOIDECTOMY 09/28/17  Clinical Impression  Pt appears at baseline level of functioning and is mod I with all gait and mobility.  Pt states he exercises frequently and PT encouraged pt to return to exercise as physician advises and when medically appropriate. Pt with no further PT needs identified at this time.    Follow Up Recommendations No PT follow up    Equipment Recommendations  None recommended by PT    Recommendations for Other Services       Precautions / Restrictions Precautions Precautions: Fall Restrictions Weight Bearing Restrictions: No      Mobility  Bed Mobility Overal bed mobility: Independent                Transfers Overall transfer level: Modified independent                  Ambulation/Gait Ambulation/Gait assistance: Modified independent (Device/Increase time) Gait Distance (Feet): 100 Feet Assistive device: Straight cane       General Gait Details: pt with slow cadence and cautious gait, no symptoms of dizziness during gait  Stairs            Wheelchair Mobility    Modified Rankin (Stroke Patients Only)       Balance Overall balance assessment: Modified Independent                                           Pertinent Vitals/Pain Pain Assessment: No/denies pain    Home Living Family/patient expects to be discharged to:: Private residence Living Arrangements: Spouse/significant other Available Help at Discharge: Friend(s);Available 24 hours/day Type of  Home: House Home Access: Level entry     Home Layout: One level Home Equipment: Cane - single point      Prior Function Level of Independence: Independent with assistive device(s)         Comments: uses SPC     Hand Dominance        Extremity/Trunk Assessment   Upper Extremity Assessment Upper Extremity Assessment: Generalized weakness    Lower Extremity Assessment Lower Extremity Assessment: Generalized weakness    Cervical / Trunk Assessment Cervical / Trunk Assessment: Normal  Communication   Communication: No difficulties  Cognition Arousal/Alertness: Awake/alert Behavior During Therapy: WFL for tasks assessed/performed Overall Cognitive Status: Within Functional Limits for tasks assessed                                        General Comments      Exercises     Assessment/Plan    PT Assessment Patent does not need any further PT services  PT Problem List         PT Treatment Interventions      PT Goals (Current goals can be found in the Care Plan section)  Acute Rehab PT Goals PT Goal Formulation:  All assessment and education complete, DC therapy    Frequency     Barriers to discharge        Co-evaluation               AM-PAC PT "6 Clicks" Daily Activity  Outcome Measure Difficulty turning over in bed (including adjusting bedclothes, sheets and blankets)?: None Difficulty moving from lying on back to sitting on the side of the bed? : None Difficulty sitting down on and standing up from a chair with arms (e.g., wheelchair, bedside commode, etc,.)?: None Help needed moving to and from a bed to chair (including a wheelchair)?: A Little Help needed walking in hospital room?: A Little Help needed climbing 3-5 steps with a railing? : A Little 6 Click Score: 21    End of Session   Activity Tolerance: Patient tolerated treatment well Patient left: in bed;with call bell/phone within reach;with bed alarm set Nurse  Communication: Mobility status      Time: 0037-0488 PT Time Calculation (min) (ACUTE ONLY): 16 min   Charges:   PT Evaluation $PT Eval Low Complexity: Bar Nunn, PT, DPT  Oluwatoni Rotunno 09/29/2017, 9:45 AM

## 2017-09-29 NOTE — Progress Notes (Signed)
[name] to be D/C'd Home per MD order.  Discussed with the patient and all questions fully answered.  VSS, Skin clean, dry and intact without evidence of skin break down, no evidence of skin tears noted. IV catheter discontinued intact. Site without signs and symptoms of complications. Dressing and pressure applied.  An After Visit Summary was printed and given to the patient. Patient received prescription.  D/c education completed with patient/family including follow up instructions, medication list, d/c activities limitations if indicated, with other d/c instructions as indicated by MD - patient able to verbalize understanding, all questions fully answered.   Patient instructed to return to ED, call 911, or call MD for any changes in condition.   Patient escorted via [place], and D/C home via private auto.  [place] 09/29/2017 12:20 PM

## 2017-09-30 NOTE — Discharge Summary (Signed)
Physician Discharge Summary  [name] ZDG:[id number] DOB: [birth year] DOA: 09/21/2017  PCP: Dineen Kid, MD  Admit date: 09/21/2017 Discharge date: 09/29/2017  Admitted From: Home. Disposition:  Home  Recommendations for Outpatient Follow-up:  1. Follow up with PCP in 1-2 weeks 2. Please obtain BMP/CBC in one week Please follow up with Dr Oletta Lamas in one week.  Please follow up with the surgeon as recommended.  Please restart the xarelto if there are no more episodes of bleeding.   Discharge Condition: stable.  CODE STATUS: full code.  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 Hgb noted to be down to 9 from baseline of 11. He was admitted and GI consulted. Xarelto has been held and bleeding is ongoing. GI performed colonoscopy 7/29 showing bleeding internal hemorrhoids, otherwise normal mucosa. General surgery has been consulted per GI recommendations and underwent  hemorrhoidal ligation and hemorrhoidectomy. Hgb has trended slowly downward but has not required a transfusion as of yet. during the hemorrhoidectomy, there was some old blood proximally, discussed with Dr Oletta Lamas to see if he needs EGD. Dr Oletta Lamas did not recommend EGD and suggested to follow up outpatient.   Discharge Diagnoses:  Principal Problem:   Acute lower GI bleeding Active Problems:   Post PTCA   Atrial flutter (HCC)   Acute blood loss anemia   Acute kidney injury superimposed on CKD (HCC)   Internal bleeding hemorrhoids   CKD (chronic kidney disease) stage 3, GFR 30-59 ml/min (HCC)   Chronic anticoagulation   Atrial fibrillation (HCC)   Acute GI bleed / aacute blood loss anemia:  Suspect from diverticulosis vs hemorrhoids.   Underwent colonoscopy showing a bleeding internal hemorrhoids.  Surgery consulted and he underwent hemorrhoidectomy.  No further bleeding episodes.  Discussed with Dr Oletta Lamas regarding old blood seen during the procedure, he did not recommend an EGd at this time. Suggests its probably old blood from the hemorrhoidal bleed.  After discussing with DR Buccini who was covering Dr Oletta Lamas, recommended holding the xarelto for one week and restart it after one week.      Atrial fibrillation: rater controlled on metoprolol. anti coagulation held for recent bleed, . Recommend to restart the xarelto after discussing with PCP and Dr Oletta Lamas, in one week. [name]  CAD s/p PCI, HTN: no chest pain or sob.  Resume BB, ARB, statin.    AKI on stage 3 CKD: Improving with hydration.    H/o Parkinson's disease:  Resume home meds.    History of prostate CA Tx w/brachytherapy and recent hematuria:  - Follow up with urology.   Indirect hyperbilirubinemia: Mild, not felt to be due to hemolysis. Possible gilbert's. RUQ U/S benign, h/o cholecystectomy. - Monitor      Discharge Instructions  Discharge Instructions    Diet - low sodium heart healthy   Complete by:  As directed    Discharge instructions   Complete by:  As directed    Please hold xarelto for one week and restart it after checking your hemoglobin lin one week at PCP office.  Please check with the surgeon and GI or PCP before restarting xarelto.  Please follow up with PCP in one week for CBC.     Allergies as of 09/29/2017  Reactions   Nsaids Other (See Comments)   Cannot have any of these because he is currently taking Xarelto      Medication List    STOP taking these medications   amLODipine 5 MG tablet Commonly known as:  NORVASC   losartan 50 MG tablet Commonly known as:  COZAAR   triamterene-hydrochlorothiazide 37.5-25 MG tablet Commonly known as:  MAXZIDE-25     TAKE these medications    carbidopa-levodopa 25-100 MG tablet Commonly known as:  SINEMET IR 2 at 7am/2 at 11am/1 at 4pm What changed:    how much to take  how to take this  when to take this  additional instructions   furosemide 20 MG tablet Commonly known as:  LASIX Take 20 mg by mouth daily.   hydrocortisone 25 MG suppository Commonly known as:  ANUSOL-HC Place 25 mg rectally 2 (two) times daily.   metoprolol succinate 100 MG 24 hr tablet Commonly known as:  TOPROL-XL Take 100 mg by mouth every morning. Take with or immediately following a meal.   pantoprazole 40 MG tablet Commonly known as:  PROTONIX Take 1 tablet (40 mg total) by mouth daily.   polyethylene glycol packet Commonly known as:  MIRALAX / GLYCOLAX Take 17 g by mouth 2 (two) times daily.   Rivaroxaban 15 MG Tabs tablet Commonly known as:  XARELTO Take 1 tablet (15 mg total) by mouth daily with supper. Start taking on:  10/07/2017 What changed:  These instructions start on 10/07/2017. If you are unsure what to do until then, ask your doctor or other care provider.   rosuvastatin 10 MG tablet Commonly known as:  CRESTOR Take 10 mg by mouth at bedtime.   senna-docusate 8.6-50 MG tablet Commonly known as:  Senokot-S Take 2 tablets by mouth 2 (two) times daily.   tamsulosin 0.4 MG Caps capsule Commonly known as:  FLOMAX Take 0.4 mg by mouth at bedtime.   Vitamin D-3 1000 units Caps Take 1,000 Units by mouth daily.      Follow-up Information    [facility], [place] Follow up.   Specialty:  General Surgery Why:  Follow up with colorectal specialist as needed for hemorrhoids Contact information: [place] [address] [phone]        Via, Lennette Bihari, MD. Schedule an appointment as soon as possible for a visit in 1 week(s).   Specialty:  Family Medicine Contact information: [place] [address] [phone]          Allergies  Allergen Reactions  . Nsaids  Other (See Comments)    Cannot have any of these because he is currently taking Xarelto    Consultations:  Surgery.    Procedures/Studies: US Abdomen Limited Ruq  Result Date: 09/24/2017 CLINICAL DATA:  Hyperbilirubinemia. EXAM: ULTRASOUND ABDOMEN LIMITED RIGHT UPPER QUADRANT COMPARISON:  Abdominal CT 09/01/2017 FINDINGS: Gallbladder: Surgically absent. Common bile duct: Diameter: 4 mm were visualized. Liver: Technically limited evaluation given elevated right hemidiaphragm and high position of the liver in the abdomen. No focal lesion identified. Within normal limits in parenchymal echogenicity. Portal vein is patent on color Doppler imaging with normal direction of blood flow towards the liver. IMPRESSION: 1. Postcholecystectomy without biliary dilatation. 2. Limited assessment of the liver demonstrates no sonographic abnormality or explanation for hyperbilirubinemia. Sonographic liver evaluation is limited given high positioning under elevated hemidiaphragm. Electronically Signed   By: Jeb Levering M.D.   On: 09/24/2017 04:23  Subjective: No chest pain or sob. No nausea, or vomiting.   Discharge Exam: Vitals:   09/29/17 0528 09/29/17 1016  BP: (!) 96/54 (!) 101/57  Pulse: (!) 50   Resp: 20   Temp: 98.1 F (36.7 C)   SpO2: 98%    Vitals:   09/28/17 1526 09/28/17 2111 09/29/17 0528 09/29/17 1016  BP: (!) 91/53 (!) 100/50 (!) 96/54 (!) 101/57  Pulse: 67 69 (!) 50   Resp: 17 16 20    Temp: 98 F (36.7 C) 97.9 F (36.6 C) 98.1 F (36.7 C)   TempSrc: Oral Oral Oral   SpO2: 97% 100% 98%   Weight:      Height:        General: Pt is alert, awake, not in acute distress Cardiovascular: RRR, S1/S2 +, no rubs, no gallops Respiratory: CTA bilaterally, no wheezing, no rhonchi Abdominal: Soft, NT, ND, bowel sounds + Extremities: no edema, no cyanosis    The results of significant diagnostics from this hospitalization (including imaging, microbiology, ancillary and  laboratory) are listed below for reference.     Microbiology: No results found for this or any previous visit (from the past 240 hour(s)).   Labs: BNP (last 3 results) No results for input(s): BNP in the last 8760 hours. Basic Metabolic Panel: Recent Labs  Lab 09/24/17 0643 09/26/17 0550 09/27/17 0308  NA 138 136 135  K 4.5 4.0 4.1  CL 103 104 100  CO2 27 27 26   GLUCOSE 88 88 99  BUN 21 15 16   CREATININE 1.59* 1.50* 1.66*  CALCIUM 8.9 8.8* 8.8*   Liver Function Tests: Recent Labs  Lab 09/25/17 0524  BILITOT 2.2*   No results for input(s): LIPASE, AMYLASE in the last 168 hours. No results for input(s): AMMONIA in the last 168 hours. CBC: Recent Labs  Lab 09/25/17 0524 09/26/17 0550 09/27/17 0308 09/28/17 0321 09/29/17 0438  WBC 3.5* 3.4* 4.0 5.6 5.7  HGB 8.0* 8.0* 7.6* 7.8* 7.5*  HCT 26.2* 25.7* 25.0* 25.2* 24.2*  MCV 92.6 90.8 91.6 90.0 90.0  PLT 126* 133* 143* 146* 140*   Cardiac Enzymes: No results for input(s): CKTOTAL, CKMB, CKMBINDEX, TROPONINI in the last 168 hours. BNP: Invalid input(s): POCBNP CBG: No results for input(s): GLUCAP in the last 168 hours. D-Dimer No results for input(s): DDIMER in the last 72 hours. Hgb A1c No results for input(s): HGBA1C in the last 72 hours. Lipid Profile No results for input(s): CHOL, HDL, LDLCALC, TRIG, CHOLHDL, LDLDIRECT in the last 72 hours. Thyroid function studies No results for input(s): TSH, T4TOTAL, T3FREE, THYROIDAB in the last 72 hours.  Invalid input(s): FREET3 Anemia work up No results for input(s): VITAMINB12, FOLATE, FERRITIN, TIBC, IRON, RETICCTPCT in the last 72 hours. Urinalysis No results found for: COLORURINE, APPEARANCEUR, LABSPEC, [place], GLUCOSEU, HGBUR, BILIRUBINUR, KETONESUR, PROTEINUR, UROBILINOGEN, NITRITE, LEUKOCYTESUR Sepsis Labs Invalid input(s): PROCALCITONIN,  WBC,  LACTICIDVEN Microbiology No results found for this or any previous visit (from the past 240 hour(s)).   Time  coordinating discharge: 34 minutes  SIGNED:   Hosie Poisson, MD  [facility] 09/30/2017, 1:54 PM Pager   If 7PM-7AM, please contact night-coverage [email or web address] Password [email or web address]

## 2017-10-03 DIAGNOSIS — I2510 Atherosclerotic heart disease of native coronary artery without angina pectoris: Secondary | ICD-10-CM | POA: Diagnosis not present

## 2017-10-03 DIAGNOSIS — I872 Venous insufficiency (chronic) (peripheral): Secondary | ICD-10-CM | POA: Diagnosis not present

## 2017-10-03 DIAGNOSIS — I10 Essential (primary) hypertension: Secondary | ICD-10-CM | POA: Diagnosis not present

## 2017-10-03 DIAGNOSIS — I480 Paroxysmal atrial fibrillation: Secondary | ICD-10-CM | POA: Diagnosis not present

## 2017-10-04 DIAGNOSIS — N183 Chronic kidney disease, stage 3 (moderate): Secondary | ICD-10-CM | POA: Diagnosis not present

## 2017-10-04 DIAGNOSIS — D500 Iron deficiency anemia secondary to blood loss (chronic): Secondary | ICD-10-CM | POA: Diagnosis not present

## 2017-10-04 DIAGNOSIS — I2510 Atherosclerotic heart disease of native coronary artery without angina pectoris: Secondary | ICD-10-CM | POA: Diagnosis not present

## 2017-10-04 DIAGNOSIS — I10 Essential (primary) hypertension: Secondary | ICD-10-CM | POA: Diagnosis not present

## 2017-10-04 DIAGNOSIS — K649 Unspecified hemorrhoids: Secondary | ICD-10-CM | POA: Diagnosis not present

## 2017-10-10 DIAGNOSIS — D500 Iron deficiency anemia secondary to blood loss (chronic): Secondary | ICD-10-CM | POA: Diagnosis not present

## 2017-10-10 DIAGNOSIS — I2510 Atherosclerotic heart disease of native coronary artery without angina pectoris: Secondary | ICD-10-CM | POA: Diagnosis not present

## 2017-10-10 DIAGNOSIS — N183 Chronic kidney disease, stage 3 (moderate): Secondary | ICD-10-CM | POA: Diagnosis not present

## 2017-10-10 DIAGNOSIS — I10 Essential (primary) hypertension: Secondary | ICD-10-CM | POA: Diagnosis not present

## 2017-10-10 DIAGNOSIS — K649 Unspecified hemorrhoids: Secondary | ICD-10-CM | POA: Diagnosis not present

## 2017-10-20 DIAGNOSIS — D500 Iron deficiency anemia secondary to blood loss (chronic): Secondary | ICD-10-CM | POA: Diagnosis not present

## 2017-10-20 DIAGNOSIS — K5900 Constipation, unspecified: Secondary | ICD-10-CM | POA: Diagnosis not present

## 2017-10-20 DIAGNOSIS — K921 Melena: Secondary | ICD-10-CM | POA: Diagnosis not present

## 2017-10-31 DIAGNOSIS — R600 Localized edema: Secondary | ICD-10-CM | POA: Diagnosis not present

## 2017-10-31 DIAGNOSIS — D500 Iron deficiency anemia secondary to blood loss (chronic): Secondary | ICD-10-CM | POA: Diagnosis not present

## 2017-11-13 DIAGNOSIS — R310 Gross hematuria: Secondary | ICD-10-CM | POA: Diagnosis not present

## 2017-11-13 DIAGNOSIS — R3915 Urgency of urination: Secondary | ICD-10-CM | POA: Diagnosis not present

## 2017-11-24 DIAGNOSIS — L821 Other seborrheic keratosis: Secondary | ICD-10-CM | POA: Diagnosis not present

## 2017-11-24 DIAGNOSIS — D225 Melanocytic nevi of trunk: Secondary | ICD-10-CM | POA: Diagnosis not present

## 2017-11-24 DIAGNOSIS — D485 Neoplasm of uncertain behavior of skin: Secondary | ICD-10-CM | POA: Diagnosis not present

## 2017-11-24 DIAGNOSIS — I8312 Varicose veins of left lower extremity with inflammation: Secondary | ICD-10-CM | POA: Diagnosis not present

## 2017-11-24 DIAGNOSIS — L812 Freckles: Secondary | ICD-10-CM | POA: Diagnosis not present

## 2017-11-24 DIAGNOSIS — L570 Actinic keratosis: Secondary | ICD-10-CM | POA: Diagnosis not present

## 2017-11-24 DIAGNOSIS — Z85828 Personal history of other malignant neoplasm of skin: Secondary | ICD-10-CM | POA: Diagnosis not present

## 2017-11-24 DIAGNOSIS — I872 Venous insufficiency (chronic) (peripheral): Secondary | ICD-10-CM | POA: Diagnosis not present

## 2017-11-24 DIAGNOSIS — I8311 Varicose veins of right lower extremity with inflammation: Secondary | ICD-10-CM | POA: Diagnosis not present

## 2017-11-24 DIAGNOSIS — C44629 Squamous cell carcinoma of skin of left upper limb, including shoulder: Secondary | ICD-10-CM | POA: Diagnosis not present

## 2017-11-24 DIAGNOSIS — Z85820 Personal history of malignant melanoma of skin: Secondary | ICD-10-CM | POA: Diagnosis not present

## 2017-11-28 DIAGNOSIS — D500 Iron deficiency anemia secondary to blood loss (chronic): Secondary | ICD-10-CM | POA: Diagnosis not present

## 2017-12-13 DIAGNOSIS — Z23 Encounter for immunization: Secondary | ICD-10-CM | POA: Diagnosis not present

## 2017-12-28 DIAGNOSIS — Z85828 Personal history of other malignant neoplasm of skin: Secondary | ICD-10-CM | POA: Diagnosis not present

## 2017-12-28 DIAGNOSIS — Z85820 Personal history of malignant melanoma of skin: Secondary | ICD-10-CM | POA: Diagnosis not present

## 2017-12-28 DIAGNOSIS — C44729 Squamous cell carcinoma of skin of left lower limb, including hip: Secondary | ICD-10-CM | POA: Diagnosis not present

## 2017-12-28 DIAGNOSIS — D485 Neoplasm of uncertain behavior of skin: Secondary | ICD-10-CM | POA: Diagnosis not present

## 2018-01-02 DIAGNOSIS — H35362 Drusen (degenerative) of macula, left eye: Secondary | ICD-10-CM | POA: Diagnosis not present

## 2018-01-02 DIAGNOSIS — I480 Paroxysmal atrial fibrillation: Secondary | ICD-10-CM | POA: Diagnosis not present

## 2018-01-02 DIAGNOSIS — E7800 Pure hypercholesterolemia, unspecified: Secondary | ICD-10-CM | POA: Diagnosis not present

## 2018-01-02 DIAGNOSIS — I2510 Atherosclerotic heart disease of native coronary artery without angina pectoris: Secondary | ICD-10-CM | POA: Diagnosis not present

## 2018-01-11 DIAGNOSIS — I2510 Atherosclerotic heart disease of native coronary artery without angina pectoris: Secondary | ICD-10-CM | POA: Diagnosis not present

## 2018-01-11 DIAGNOSIS — I4819 Other persistent atrial fibrillation: Secondary | ICD-10-CM | POA: Diagnosis not present

## 2018-01-11 DIAGNOSIS — I10 Essential (primary) hypertension: Secondary | ICD-10-CM | POA: Diagnosis not present

## 2018-01-11 DIAGNOSIS — I872 Venous insufficiency (chronic) (peripheral): Secondary | ICD-10-CM | POA: Diagnosis not present

## 2018-01-16 DIAGNOSIS — I4811 Longstanding persistent atrial fibrillation: Secondary | ICD-10-CM | POA: Diagnosis not present

## 2018-01-16 DIAGNOSIS — Z1389 Encounter for screening for other disorder: Secondary | ICD-10-CM | POA: Diagnosis not present

## 2018-01-16 DIAGNOSIS — I2510 Atherosclerotic heart disease of native coronary artery without angina pectoris: Secondary | ICD-10-CM | POA: Diagnosis not present

## 2018-01-16 DIAGNOSIS — D500 Iron deficiency anemia secondary to blood loss (chronic): Secondary | ICD-10-CM | POA: Diagnosis not present

## 2018-01-16 DIAGNOSIS — Z0000 Encounter for general adult medical examination without abnormal findings: Secondary | ICD-10-CM | POA: Diagnosis not present

## 2018-01-16 DIAGNOSIS — G629 Polyneuropathy, unspecified: Secondary | ICD-10-CM | POA: Diagnosis not present

## 2018-01-16 DIAGNOSIS — G20 Parkinson's disease: Secondary | ICD-10-CM | POA: Diagnosis not present

## 2018-01-16 DIAGNOSIS — Z23 Encounter for immunization: Secondary | ICD-10-CM | POA: Diagnosis not present

## 2018-01-16 DIAGNOSIS — R600 Localized edema: Secondary | ICD-10-CM | POA: Diagnosis not present

## 2018-01-16 DIAGNOSIS — I10 Essential (primary) hypertension: Secondary | ICD-10-CM | POA: Diagnosis not present

## 2018-01-18 NOTE — Progress Notes (Signed)
[name] was seen today in the movement disorders clinic for neurologic consultation at the request of Via, Lennette Bihari, MD.  This patient is accompanied in the office by his partner who supplements the history.  The consultation is for the evaluation of gait abnormality and to r/o PD.  The records that were made available to me were reviewed.  12/16/16 update:  Patient is seen today in follow-up for newly diagnosed Parkinson's disease.  This patient is accompanied in the office by his spouse who supplements the history.  Patient was started on levodopa last visit.  He states that he is doing better.  His tremor is better.  Lip tremor has resolved.  No SE.  He is taking carbidopa/levodopa 25/100 at 7am/11am/4pm.    No falls.    I did refer him to Parkinson's rehabilitation, but he opted to hold on that until he finishes cardiac rehabilitation.   Sees Dr. Nadyne Coombes.  04/27/17 update: Patient is seen today in follow-up.  He remains on carbidopa/levodopa 25/100, 1 tablet 3 times per day.  He is doing well on this, without side effects.  No hallucinations.  He has been admitted to the hospital twice since last visit.  The first was at the end of November, 2018 due to chest pain, which was felt likely supply demand mismatch due to atrial flutter.  He returned to the emergency room in December, 2018 and was admitted for persistent atrial flutter with variable tachycardia.  Diltiazem was added in addition to his metoprolol.  He is now having dizziness since the medication was started.  They have a f/u with cardiology after our visit today (Dr. Virgina Jock).  No falls.  No hallucinations.  He gets up in the AM and does 30 min on the bike and then does strength and cardio for 30 min.    08/29/17 update: Patient is seen today in follow-up for Parkinson's disease. This patient is accompanied in the office by his spouse who supplements the history. Patient is on carbidopa/levodopa 25/100, 1 tablet 3 times per day.   No wearing off but he cannot tell if it is working either.  Pt denies falls.  Pt denies lightheadedness, near syncope.  No hallucinations.  Mood has been good. Does 30 min on home exercise bike daily.   Had hematuria last week and saw urology.  Pt states that he was told a "blood vessel erupted on his prostate."  cardizem d/c after our last visit and it helped dizziness.  01/19/18 update: Patient is seen today in follow-up for Parkinson's disease, accompanied by his wife who supplements history.  Patient is on carbidopa/levodopa, 2 tablets in the morning, 2 in the afternoon and 1 in the evening.  This was an increase last visit.  Patient reports that he is a lot better.  Tremor is better and walking is better.  No falls since our last visit.  No lightheadedness or near syncope.  No hallucinations.  Patient is exercising on the home exercise bike (just getting back to it after hospitalization).  Records are reviewed since our last visit.  The patient was hospitalized for a GI bleed at the end of July/beginning of August.  He had bleeding internal hemmorhoids.  He is feeling better but his energy is not fully back to normal.  His PCP started him on gabapentin 300 mg at night for feet/nerve discomfort and that has helped.  Only started it 3 days ago.  PREVIOUS MEDICATIONS: none to date  ALLERGIES:  Allergies  Allergen Reactions  . Nsaids Other (See Comments)    Cannot have any of these because he is currently taking Xarelto    CURRENT MEDICATIONS:  Outpatient Encounter Medications as of 01/19/2018  Medication Sig  . amLODipine (NORVASC) 5 MG tablet Take 5 mg by mouth daily.  . carbidopa-levodopa (SINEMET IR) 25-100 MG tablet 2 at 7am/2 at 11am/1 at 4pm (Patient taking differently: Take 1 tablet by mouth 3 (three) times daily. )  . Cholecalciferol (VITAMIN D-3) 1000 units CAPS Take 1,000 Units by mouth daily.  . furosemide (LASIX) 20 MG tablet Take 20 mg by mouth daily.   [name] gabapentin (NEURONTIN)  300 MG capsule Take 300 mg by mouth at bedtime.  . metoprolol succinate (TOPROL-XL) 100 MG 24 hr tablet Take 100 mg by mouth every morning. Take with or immediately following a meal.   . Multiple Vitamins-Minerals (PRESERVISION AREDS 2 PO) Take by mouth daily.  . polyethylene glycol (MIRALAX / GLYCOLAX) packet Take 17 g by mouth 2 (two) times daily.  . Rivaroxaban (XARELTO) 15 MG TABS tablet Take 1 tablet (15 mg total) by mouth daily with supper.  . rosuvastatin (CRESTOR) 10 MG tablet Take 10 mg by mouth at bedtime.  . tamsulosin (FLOMAX) 0.4 MG CAPS capsule Take 0.4 mg by mouth at bedtime.  . [DISCONTINUED] hydrocortisone (ANUSOL-HC) 25 MG suppository Place 25 mg rectally 2 (two) times daily.   . [DISCONTINUED] pantoprazole (PROTONIX) 40 MG tablet Take 1 tablet (40 mg total) by mouth daily.  . [DISCONTINUED] senna-docusate (SENOKOT-S) 8.6-50 MG tablet Take 2 tablets by mouth 2 (two) times daily.   No facility-administered encounter medications on file as of 01/19/2018.     PAST MEDICAL HISTORY:   Past Medical History:  Diagnosis Date  . Arthritis    "knees; lower back" (08/09/2016)  . Atrial fibrillation ([place])    remote hx/notes 08/07/2016  . Coronary artery disease   . Episodes of trembling    "most of the time it's my left hand" (08/09/2016)  . GI bleeding 08/2017  . High cholesterol   . History of kidney stones   . Hypertension   . Melanoma of shoulder ([place]) 2000s   "left"  . Mitral regurgitation and mitral stenosis    hx/notes 08/07/2016  . Moderate aortic stenosis    hx/notes 08/07/2016  . Parkinson's disease ([place])   . Prostate cancer ([place]) 2011   hx/notes 08/07/2016    PAST SURGICAL HISTORY:   Past Surgical History:  Procedure Laterality Date  . CARDIAC CATHETERIZATION  ~ [address] [place]"  . CARDIOVERSION N/A 01/26/2017   Procedure: CARDIOVERSION;  Surgeon: [name], MD;  Location: [facility];  Service: Cardiovascular;  Laterality: N/A;  . CATARACT  EXTRACTION W/ INTRAOCULAR LENS  IMPLANT, BILATERAL Bilateral 2014-02/2016   left-right; hx/notes 08/07/2016  . COLONOSCOPY WITH PROPOFOL N/A 09/25/2017   Procedure: COLONOSCOPY WITH PROPOFOL;  Surgeon: Laurence Spates, MD;  Location: [facility];  Service: Endoscopy;  Laterality: N/A;  . CORONARY ANGIOPLASTY WITH STENT PLACEMENT  08/09/2016  . CORONARY ATHERECTOMY N/A 08/09/2016   Procedure: Coronary Atherectomy;  Surgeon: Adrian Prows, MD;  Location: [facility];  Service: Cardiovascular;  Laterality: N/A;  . CORONARY STENT INTERVENTION N/A 08/09/2016   Procedure: Coronary Stent Intervention;  Surgeon: Adrian Prows, MD;  Location: [facility];  Service: Cardiovascular;  Laterality: N/A;  LAD  . EYE SURGERY Bilateral 05/2016   "laser on both lenses"  . HEMORRHOID SURGERY N/A 09/27/2017  Procedure: HEMORRHOIDECTOMY;  Surgeon: Erroll Luna, MD;  Location: [facility];  Service: General;  Laterality: N/A;  . INGUINAL HERNIA REPAIR Left 1991  . INSERTION PROSTATE RADIATION SEED  06/25/2009  . INTRAVASCULAR PRESSURE WIRE/FFR STUDY N/A 08/09/2016   Procedure: Intravascular Pressure Wire/FFR Study;  Surgeon: Adrian Prows, MD;  Location: [facility];  Service: Cardiovascular;  Laterality: N/A;  . LAPAROSCOPIC CHOLECYSTECTOMY  1993  . MELANOMA EXCISION Left 1990s   "shoulder"  . RIGHT/LEFT HEART CATH AND CORONARY ANGIOGRAPHY N/A 08/09/2016   Procedure: Right/Left Heart Cath and Coronary Angiography;  Surgeon: Adrian Prows, MD;  Location: [facility];  Service: Cardiovascular;  Laterality: N/A;  . TEE WITHOUT CARDIOVERSION N/A 01/25/2017   Procedure: TRANSESOPHAGEAL ECHOCARDIOGRAM (TEE);  Surgeon: [name], MD;  Location: [facility];  Service: Cardiovascular;  Laterality: N/A;    SOCIAL HISTORY:   Social History   Socioeconomic History  . Marital status: Divorced    Spouse name: Not on file  . Number of children: Not on file  . Years of education: Not on file  .  Highest education level: Not on file  Occupational History  . 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 Financial resource strain: Not on file  . Food insecurity:    Worry: Not on file    Inability: Not on file  . Transportation needs:    Medical: Not on file    Non-medical: Not on file  Tobacco Use  . Smoking status: Never Smoker  . Smokeless tobacco: Never Used  . Tobacco comment: "smoked  2 months when I was 20; non since" (08/09/2016)  Substance and Sexual Activity  . Alcohol use: Yes    Alcohol/week: 14.0 standard drinks    Types: 14 Shots of liquor per week    Comment: 2 drinks per day (canadian mist)  . Drug use: No  . Sexual activity: Not Currently  Lifestyle  . Physical activity:    Days per week: Not on file    Minutes per session: Not on file  . Stress: Not on file  Relationships  . Social connections:    Talks on phone: Not on file    Gets together: Not on file    Attends religious service: Not on file    Active member of club or organization: Not on file    Attends meetings of clubs or organizations: Not on file    Relationship status: Not on file  . Intimate partner violence:    Fear of current or ex partner: Not on file    Emotionally abused: Not on file    Physically abused: Not on file    Forced sexual activity: Not on file  Other Topics Concern  . Not on file  Social History Narrative   Retired Hotel manager with [employer]    FAMILY HISTORY:   Family Status  Relation Name Status  . Mother  Deceased  . Father  Deceased  . Sister  Deceased  . Brother  Alive  . Daughter  Alive    ROS:  Review of Systems  Constitutional: Positive for malaise/fatigue.  HENT: Negative.   Eyes: Negative.   Respiratory: Negative.   Cardiovascular: Negative.   Genitourinary: Negative.   Musculoskeletal: Negative.   Skin: Negative.      PHYSICAL EXAMINATION:    VITALS:   Vitals:   01/19/18 1040  BP: 124/82  Pulse: 72  SpO2:  96%  Weight:  217 lb (98.4 kg)  Height: 5\' 11"  (1.803 m)    GEN:  The patient appears stated age and is in NAD. HEENT:  Normocephalic, atraumatic.  The mucous membranes are moist. The superficial temporal arteries are without ropiness or tenderness. CV:  RRR Lungs:  CTAB Neck/HEME:  There are no carotid bruits bilaterally.  Neurological examination:  Orientation: The patient is alert and oriented x3. Cranial nerves: There is good facial symmetry. The speech is fluent and clear. Soft palate rises symmetrically and there is no tongue deviation. Hearing is intact to conversational tone. Sensation: Sensation is intact to light touch throughout Motor: Strength is 5/5 in the bilateral upper and lower extremities.   Shoulder shrug is equal and symmetric.  There is no pronator drift.  Movement examination: Tone: There is mod increased tone in the UE, but likely a strong component of paratonia as pt just has trouble relaxing.  It is also time for patients medication Abnormal movements: There is no tremor noted Coordination:  There is no decremation, with any form of RAMS, including alternating supination and pronation of the forearm, hand opening and closing, finger taps, heel taps and toe taps bilaterally Gait and Station: The patient has no difficulty arising out of a deep-seated chair without the use of the hands. The patient's stride length is decreased but he is no longer dragging the L leg   Labs:  Lab Results  Component Value Date   WBC 5.7 09/29/2017   HGB 7.5 (L) 09/29/2017   HCT 24.2 (L) 09/29/2017   MCV 90.0 09/29/2017   PLT 140 (L) 09/29/2017     Chemistry      Component Value Date/Time   NA 135 09/27/2017 0308   K 4.1 09/27/2017 0308   CL 100 09/27/2017 0308   CO2 26 09/27/2017 0308   BUN 16 09/27/2017 0308   CREATININE 1.66 (H) 09/27/2017 0308      Component Value Date/Time   CALCIUM 8.8 (L) 09/27/2017 0308   ALKPHOS 83 09/23/2017 0312   AST 22 09/23/2017 0312    ALT 7 09/23/2017 0312   BILITOT 2.2 (H) 09/25/2017 0524       Lab Results  Component Value Date   TSH 2.94 09/15/2016     ASSESSMENT/PLAN:  1.  Idiopathic Parkinson's disease.  The patient has tremor, bradykinesia, rigidity and postural instability.  -We discussed the diagnosis as well as pathophysiology of the disease.  We discussed treatment options as well as prognostic indicators.  Patient education was provided.  -continue carbidopa/levodopa 25/100, 2/2/1.  RX sent today  -get back to exercise as tolerated  2. dizziness  -improved after cardizem d/c  3.  Recent GI bleed d/t internal hemmorhoids  -doing better but still with decreased energy.  Reports HgB up to 9.7.    4.  Feet pain  -was started on gabapentin by PCP.  No objection but will need to monitor as has renal insufficiency (or did in the hospital) and gabapentin can cause myoclonus.  Pt only on low dose so likely okay.  5.  Follow up is anticipated in the next few months, sooner should new neurologic issues arise.  Much greater than 50% of this visit was spent in counseling and coordinating care.  Total face to face time:  25 min     Cc:  Via, Lennette Bihari, MD

## 2018-01-19 ENCOUNTER — Ambulatory Visit (INDEPENDENT_AMBULATORY_CARE_PROVIDER_SITE_OTHER): Payer: Medicare Other | Admitting: Neurology

## 2018-01-19 ENCOUNTER — Encounter: Payer: Self-pay | Admitting: Neurology

## 2018-01-19 VITALS — BP 124/82 | HR 72 | Ht 71.0 in | Wt 217.0 lb

## 2018-01-19 DIAGNOSIS — G5793 Unspecified mononeuropathy of bilateral lower limbs: Secondary | ICD-10-CM

## 2018-01-19 DIAGNOSIS — K648 Other hemorrhoids: Secondary | ICD-10-CM

## 2018-01-19 DIAGNOSIS — D649 Anemia, unspecified: Secondary | ICD-10-CM | POA: Diagnosis not present

## 2018-01-19 DIAGNOSIS — G20 Parkinson's disease: Secondary | ICD-10-CM

## 2018-01-19 MED ORDER — CARBIDOPA-LEVODOPA 25-100 MG PO TABS: ORAL_TABLET | ORAL | 1 refills | Status: DC

## 2018-02-01 DIAGNOSIS — I10 Essential (primary) hypertension: Secondary | ICD-10-CM | POA: Diagnosis not present

## 2018-02-08 DIAGNOSIS — R600 Localized edema: Secondary | ICD-10-CM | POA: Diagnosis not present

## 2018-02-08 DIAGNOSIS — I10 Essential (primary) hypertension: Secondary | ICD-10-CM | POA: Diagnosis not present

## 2018-02-08 DIAGNOSIS — I4819 Other persistent atrial fibrillation: Secondary | ICD-10-CM | POA: Diagnosis not present

## 2018-02-08 DIAGNOSIS — I872 Venous insufficiency (chronic) (peripheral): Secondary | ICD-10-CM | POA: Diagnosis not present

## 2018-02-22 DIAGNOSIS — D649 Anemia, unspecified: Secondary | ICD-10-CM | POA: Diagnosis not present

## 2018-02-22 DIAGNOSIS — I10 Essential (primary) hypertension: Secondary | ICD-10-CM | POA: Diagnosis not present

## 2018-02-22 DIAGNOSIS — R42 Dizziness and giddiness: Secondary | ICD-10-CM | POA: Diagnosis not present

## 2018-03-01 DIAGNOSIS — D649 Anemia, unspecified: Secondary | ICD-10-CM | POA: Diagnosis not present

## 2018-03-01 DIAGNOSIS — R42 Dizziness and giddiness: Secondary | ICD-10-CM | POA: Diagnosis not present

## 2018-03-01 DIAGNOSIS — I10 Essential (primary) hypertension: Secondary | ICD-10-CM | POA: Diagnosis not present

## 2018-03-05 NOTE — H&P (View-Only) (Signed)
[name] 02/08/2018 1:30 PM Location: [facility] Patient #: [id number] DOB: [birth year] Divorced / Language: [name] / Race: White Male   History of Present Illness [name] MD; 02/08/2018 5:25 PM) Patient words: 4 week F/U, Last OV 01/11/18.  The patient is a 83 year old male who presents for a Follow-up for Shortness of breath. 83 year old Caucasian male with coronary artery disease status post LAD PCI, persistnet atrial fibrillation, mild aortic stenosis, venous insufficiency, Parkinsons disease. GI bleed in 08/2017 from internal hemorrhoids, now s/p hemorrhoidectomy, here for 1 month follow up.  Pelvic the patient's symptoms of leg edema, shortness of breath, admits some changes to his dictation on 01/11/2018, I resumed amlodipine 5 mg daily, increase Lasix to 40 mg twice daily. We also discussed possibility of initiation of amiodarone and repeat cardioversion attempts if he continued to be symptomatic from his atrial fibrillation.  Patient is here for follow-up today. Recent metabolic panel shows stable stage III CKD with creatinine 1.94, eGFR 32, no significant changes compared to previous BMP on 07/05/2017.  Patient continues to have exertional dyspnea, although marginally improved since last visit. However, leg swelling is significantly worse. he recently had bilateral flank pain, thought to be due to kidney stone, two weeks ago. Patient passed some blood in his urine next morning following resolution of symptoms. Hehas not had recurrence o symptoms since then.  BP elevated today on ofice monitor, as well as home monitor. BP readings much better on home checks.  Additional reasons for visit:  Follow-up for Follow up test results   Follow-up for Atrial fibrillation    Problem List/Past Medical [name]; 02/08/2018 1:44 PM) History of prostate cancer (Z85.46) [05/2009]: OV Note-Dr Carolan Clines 07/22/2016 History of prostate cancer  status post seed implantation in April 2011, now has radiation cystitis and prostatitis, mild. Microscopic eventually a mild, no further treatment surgery. Severe aortic calcification by CT chest. Venous insufficiency (I87.2)  History of melanoma (Z85.820) [2010]: Left Shoulder Constipation, acute (K59.00)  Hemorrhoidectomy [08/2017]: After GI bleeding Essential hypertension, benign (I10)  Hypercholesteremia (E78.00)  Coronary artery disease involving native heart without angina pectoris, unspecified vessel or lesion type (I25.10)  Coronary angiogram 08/09/2016: FFR guided CSI atherectomy followed by 3.0 x 26 and 3.0 x 18 mm onyx resolute DES to Mid LAD. Mild disease in Cx and RCA. Pulmonary hypertension (I27.20)  Echocardiogram 01/02/2018: Left ventricle cavity is normal in size. Moderate concentric hypertrophy of the left ventricle. Low normal global wall motion. Visual EF is 50-55%. Unable to evaluate diastolic function due to A. Fibrillation. Calculated EF 51%. Left atrial cavity is severely dilated. Right atrial cavity is moderately dilated. Trileaflet aortic valve with moderate aortic valve leaflet calcification. Trace aortic stenosis. Moderate (Grade II) aortic regurgitation. Moderate (Grade II) mitral regurgitation. Moderate tricuspid regurgitation. Mild pulmonary hypertension. Estimated pulmonary artery systolic pressure 65 mmHg. Compared to previous echocardiogram on 09/01/2016, there is interval increase in severity of aortic regurgitation, tricuspid regurgitation, and pulmonary hypertension. Laboratory examination (Z01.89)  01/02/2018: Hemoglobin 9.9, hematocrit 33.3, microcytic indices, CBC otherwise normal. Cholesterol 106, triglycerides 48, HDL 46, LDL 50. Labs 09/29/2017: H/H 7.5/24.2. MCV 90. Platelets 140. Labs 09/27/2017: Glucose 99. BUN/Cr 16/1.66. eGFR 38. Na/K 135/4.1 Labs 07/05/2017: Glucose 88. BUN/creatinine 37/1.86. EGFR 34. Sodium 139, potassium 3.6 Labs 02/10/2017:  BUN/creatinine 27/1.50. EGFR 44. Sodium 139, potassium 4.7 Creatinine improved compared to 02/02/2017 Labs 02/02/2017: BUNs/creatinine 42/1.99. EGFR 31. Sodium 139, potassium 5.0. Labs 01/26/2017: H/H 12.6/39.2. MCV 93. Platelets 147. BUNs/creatinine 22/1.54. EGFR  41. Sodium 136, potassium 4.3. Cholesterol 109, HDL 52, LDL 45, triglycerides 62 11/10/2016: BUN 35, creatinine 1.53, EGFR 43/49, potassium 4.9, bilirubin 2.3, CMP otherwise normal. Cholesterol 146, triglycerides 63, HDL 68, LDL 65. CBC normal. 09/16/2016: Creatinine 1.74, potassium 5.0, bilirubin 2.9, CMP otherwise normal. CBC normal. CKD (chronic kidney disease) stage 3, GFR 30-59 ml/min (N18.3)  Bilateral leg edema (R60.0)  Persistent atrial fibrillation (I48.19)  EKG 01/11/2018 Atrial fibrillation with controlled ventricular response. Normal axis. Normal conduction. Cannot exclude old anteroseptal infarct. No ischemic changes. CHA2DS2VASc score 3: Annual stroke risk 3.2% Event monitor 04/27/2017 - 05/26/2017: Persistent atrial fibrillation with controlled ventricular rate. Two episodes of 3 beat nonsustained VT 4.6 second pause at 11:34 PM on 05/03/2017. 3.9 second pause at 7 AM on 05/14/2017. Symptoms not reported. Occasional episodes of atypical atrial flutter with variable conduction Event monitor 05/06/2017 11:35 PM Perissent Afib 4.6 seconds pause. I personally spoke with the patient. Patient does not recollect any presyncope/syncope episodes. He went to bed around 11 PM Asymptomatic 4.6 sec pause. EKG 04/27/2017: Atrial fibrillation controlled ventricular response. 76 bpm. Normal axis. Normal conduction. H/O: GI bleed (Z87.19)  Parkinson's disease (tremor, stiffness, slow motion, unstable posture) (G20) [09/11/2016]: OV Note-Dr [name] 09/08/2016: Parkinson's disease. Paroxysmal atrial fibrillation (I48.0) [01/24/2017]: EKG 03/10/2017: Atypical atrial flutter with controlled ventricular rate 77 bpm. Normal axis. Normal conduction.  CHA2DS2VASc score 3 : Annual stroke risk 3.2% S/p successful TEE guided cardioversion 100 J on 01/26/2017  Allergies Laren Boom; February 15, 2018 1:44 PM) No Known Drug Allergies [10/27/2014]:  Family History Laren Boom; 2018/02/15 1:44 PM) Mother  Deceased. at age 29; Pt had several Strokes, MI's in her early 9's Father  Deceased. at age 90 from an MI (was his 69st) Sister 1  Deceased. from a Cerebral Hemorrhage; Older Brother 1  4 Yrs Younger; Unknown History  Social History Laren Boom; February 15, 2018 1:44 PM) Marital status  Divorced. Living Situation  Lives with Partner Current tobacco use  Never smoker. Alcohol Use  Moderate alcohol use. 1 drink/day Number of Children  1.  Past Surgical History Laren Boom; 02-15-2018 1:44 PM) Hernia Repair [1991]: Cholecystectomy [1993]: Prostate Surgery [06/25/2009]: Cataract Extraction-Left [2014]: Cataract right 02/2016, Laser treatment 05/2016 Hemorrhoid Removal [09/25/2017]:  Medication History Laren Boom; 02-15-18 1:48 PM) Gabapentin (100MG Capsule, 1 (one) Capsule Oral daily, Taken starting 01/18/2018) Active. (Pt called to advised med added by another dr) Lasix (40MG Tablet, 1 (one) Tablet Oral two times daily, Taken starting 01/17/2018) Active. amLODIPine Besylate (5MG Tablet, 1 (one) Tablet Oral once daily, Taken starting 01/11/2018) Active. Metoprolol Succinate ER (100MG Tablet ER 24HR, 1 Tablet Tablet Tablet Tablet Oral daily, Taken starting 06/02/2017) Active. Xarelto (15MG Tablet, 1 (one) Tablet Tablet Tablet T Oral Once daily, Taken starting 01/27/2017) Active. Crestor (10MG Tablet, 1 Oral at bedtime) Active. (PT CANNOT TAKE GENERIC!) Nitrostat (0.4MG Tab Sublingual, 1 (one) Tablet Tablet Tablet T Sublingual every 5 minutes as needed for chest pain., Taken starting 11/18/2016) Active. Vitamin D3 (1000UNIT Tablet, 1 Oral daily) Active. Carbidopa/Levodopa (25-100MG Tablet, 1 Oral three times daily)  Active. Tamsulosin HCl (0.4MG Capsule, 1 Oral at bedtime) Active. PreserVision AREDS 2 (1 Oral two times daily) Active. Medications Reconciled (verbally with patient/ list present)  Diagnostic Studies History Laren Boom; 02-15-2018 1:44 PM) Echocardiogram  01/02/2018: Left ventricle cavity is normal in size. Moderate concentric hypertrophy of the left ventricle. Low normal global wall motion. Visual EF is 50-55%. Unable to evaluate diastolic function due to A. Fibrillation. Calculated EF 51%. Left atrial cavity  is severely dilated. Right atrial cavity is moderately dilated. Trileaflet aortic valve with moderate aortic valve leaflet calcification. Trace aortic stenosis. Moderate (Grade II) aortic regurgitation. Moderate (Grade II) mitral regurgitation. Moderate tricuspid regurgitation. Mild pulmonary hypertension. Estimated pulmonary artery systolic pressure 65 mmHg. Compared to previous echocardiogram on 09/01/2016, there is interval increase in severity of aortic regurgitation, tricuspid regurgitation, and pulmonary hypertension. CT Scan of Abdomen [07/08/2016]: Elevated right hemidiaphragm. Atherosclerotic calcification in all 3 coronary vessels including left main, severe calcification of the aortic valve. Aortic atherosclerosis is evident. Colonoscopy [09/21/2017]: Lower Extremity Dopplers [06/2009]: Pt has Baker Cyst behind both knees Event Monitor [04/27/2017]: Event monitor 04/27/2017 - 05/26/2017: Persistent atrial fibrillation with controlled ventricular rate. Two episodes of 3 beat nonsustained VT 4.6 second pause at 11:34 PM on 05/03/2017. 3.9 second pause at 7 AM on 05/14/2017. Symptoms not reported. Occasional episodes of atypical atrial flutter with variable conduction Echocardiogram [09/01/2016]: Left ventricle cavity is normal in size. Moderate concentric hypertrophy of the left ventricle. Normal global wall motion. Doppler evidence of grade II (pseudonormal)  diastolic dysfunction. Diastolic dysfunction findings suggests elevated LA/LV end diastolic pressure. Visual EF is 55-60%. Left atrial cavity is moderately dilated at 4.5 cm. Trileaflet aortic valve with mild (Grade I) regurgitation. Mild calcification of the aortic valve annulus. Moderate aortic valve leaflet calcification. Moderately restricted aortic valve leaflets. Trace aortic valve stenosis. Mild (Grade I) mitral regurgitation. Mild to moderate tricuspid regurgitation. Mild pulmonary hypertension. Pulmonary artery systolic pressure is estimated at 32 mm Hg. The aortic root is normal in size at 3.5 cm. Mild atherosclerotic changes in the aorta. No significant change from Echocardiogram 01/05/2011. Cardioversion [01/26/2017]: S/p successful TEE guided cardioversion 100 J Coronary Angiogram [08/09/2016]: FFR guided CSI atherectomy followed by 3.0 x 26 and 3.0 x 18 mm onyx resolute DES to mid LAD. Mild disease in Cx and RCA. Normal LVEF. Mild pulmonary hypertension. Colonoscopy [10/22/2014]: Results pending; Removed several Polyps; Diverticulitis Lower Extremity Dopplers [04/07/2017]: Lower extremity venous duplex 04/07/2017: No evidence of deep vein thrombosis of the right lower extremity with normal venous return. No evidence of valvular incompetence (chronic venous insufficiency) of the right lower extremity. No evidence of deep vein thrombosis of the left lower extremity with normal venous return. Chronic valvular incompetence (chronic venous insufficiency) of the left lower extremity. Marked reflux noted in the left sapheno-femoral junction (2.5 s), great saphenous proximal thigh (2.6 s), great saphenous mid thigh (1.6 s), great saphenous distal thigh (3.9 s) and great saphenous proximal calf (3.4 s) veins.    Review of Systems Joya Gaskins Esther Hardy MD; 02/08/2018 5:26 PM) General Not Present- Appetite Loss and Weight Gain. Respiratory Not Present- Chronic Cough and Wakes up from Sleep  Wheezing or Short of Breath. Cardiovascular Present- Edema (Worsening. Has weeping and blisters on skin). Not Present- Chest Pain, Claudications, Difficulty Breathing Lying Down, Difficulty Breathing On Exertion, Orthopnea and Paroxysmal Nocturnal Dyspnea. Gastrointestinal Not Present- Black, Tarry Stool and Difficulty Swallowing. Note: Recent hemorrhoidectomy. Pain at the site. No frank bleeding Musculoskeletal Present- Joint Pain (Bilateral knees). Not Present- Decreased Range of Motion and Muscle Atrophy. Neurological Not Present- Attention Deficit. Psychiatric Not Present- Personality Changes and Suicidal Ideation. Endocrine Not Present- Cold Intolerance and Heat Intolerance. Hematology Not Present- Abnormal Bleeding. All other systems negative  Vitals [name] MD; 02/08/2018 1:59 PM) 02/08/2018 1:39 PM Weight: 224.38 lb Height: 71in Body Surface Area: 2.21 m Body Mass Index: 31.29 kg/m  Pulse: 67 (Irregular)  P.OX: 98% (Room air) BP: 163/79 (Sitting, Left Arm,  Standard)       Physical Exam [name] MD; 02/08/2018 5:27 PM) General Mental Status-Alert. General Appearance-Cooperative and Appears stated age. Build & Nutrition-Moderately built.  Head and Neck Thyroid Gland Characteristics - normal size and consistency and no palpable nodules.  Chest and Lung Exam Chest and lung exam reveals -quiet, even and easy respiratory effort with no use of accessory muscles, non-tender and on auscultation, normal breath sounds, no adventitious sounds.  Cardiovascular Cardiovascular examination reveals -carotid auscultation reveals no bruits, abdominal aorta auscultation reveals no bruits and no prominent pulsation and femoral artery auscultation bilaterally reveals normal pulses, no bruits, no thrills. Auscultation Rhythm - Irregularly irregular. Murmurs & Other Heart Sounds: Murmur - Location - Aortic Area. Timing - Mid-systolic. Grade -  III/VI.  Abdomen Palpation/Percussion Normal exam - Non Tender and No hepatosplenomegaly.  Peripheral Vascular Lower Extremity  Inspection: Note: Blisters. Palpation - Edema - Bilateral - 2+ Pitting edema. Note: bilateral venous insufficiency. Dorsalis pedis pulse - Bilateral - Note: Difficult to appreciate due to edema. Posterior tibial pulse - Bilateral - Note: Difficult to appreciate due to edema. Carotid arteries - Bilateral-No Carotid bruit.  Neurologic Neurologic evaluation reveals -alert and oriented x 3 with no impairment of recent or remote memory. Motor-Grossly intact without any focal deficits. Note: Stable tremors. Sees Dr. Carles Collet for Parkinspons disease   Musculoskeletal Global Assessment Left Lower Extremity - no deformities, masses or tenderness, no known fractures. Right Lower Extremity - no deformities, masses or tenderness, no known fractures.   Results [name] MD; 02/08/2018 5:34 PM) Labs  Name Value Range Date METABOLIC PANEL, BASIC (40981)   Collected: 02/01/2018 11:46 AM Glucose 93 mg/dL 65-99 mg/dL Collected: 02/01/2018 11:46 AM BUN 28 mg/dL 8-27 mg/dL Collected: 02/01/2018 11:46 AM Creatinine 1.94 mg/dL 0.76-1.27 mg/dL Collected: 02/01/2018 11:46 AM eGFR If NonAfricn Am 32 mL/min/1.73 >59 mL/min/1.73 Collected: 02/01/2018 11:46 AM eGFR If Africn Am 37 mL/min/1.73 >59 mL/min/1.73 Collected: 02/01/2018 11:46 AM BUN/Creatinine Ratio 14 10-24 Collected: 02/01/2018 11:46 AM Sodium 145 mmol/L 134-144 mmol/L Collected: 02/01/2018 11:46 AM Potassium 3.9 mmol/L 3.5-5.2 mmol/L Collected: 02/01/2018 11:46 AM Chloride 99 mmol/L 96-106 mmol/L Collected: 02/01/2018 11:46 AM Carbon Dioxide, Total 26 mmol/L 20-29 mmol/L Collected: 02/01/2018 11:46 AM Calcium 9.5 mg/dL 8.6-10.2 mg/dL Collected: 02/01/2018 19:14 AM Basic Metabolic Panel (8)   Collected: 07/05/2017  11:11 AM Glucose 88 mg/dL 65-99 mg/dL Collected: 07/05/2017 11:11 AM BUN 37 mg/dL 8-27 mg/dL Collected: 07/05/2017 11:11 AM Creatinine 1.86 mg/dL 0.76-1.27 mg/dL Collected: 07/05/2017 11:11 AM eGFR If NonAfricn Am 34 mL/min/1.73 >59 mL/min/1.73 Collected: 07/05/2017 11:11 AM eGFR If Africn Am 39 mL/min/1.73 >59 mL/min/1.73 Collected: 07/05/2017 11:11 AM BUN/Creatinine Ratio 20 10-24 Collected: 07/05/2017 11:11 AM Sodium 139 mmol/L 134-144 mmol/L Collected: 07/05/2017 11:11 AM Potassium 3.6 mmol/L 3.5-5.2 mmol/L Collected: 07/05/2017 11:11 AM Chloride 97 mmol/L 96-106 mmol/L Collected: 07/05/2017 11:11 AM Carbon Dioxide, Total 23 mmol/L 20-29 mmol/L Collected: 07/05/2017 11:11 AM Calcium 9.4 mg/dL 8.6-10.2 mg/dL Collected: 07/05/2017 11:11 AM    Assessment & Plan Joya Gaskins Esther Hardy MD; 02/08/2018 5:34 PM) Persistent atrial fibrillation (I48.19) Story: EKG 01/11/2018 Atrial fibrillation with controlled ventricular response. Normal axis. Normal conduction. Cannot exclude old anteroseptal infarct. No ischemic changes.  CHA2DS2VASc score 3: Annual stroke risk 3.2%  Event monitor 04/27/2017 - 05/26/2017: Persistent atrial fibrillation with controlled ventricular rate. Two episodes of 3 beat nonsustained VT 4.6 second pause at 11:34 PM on 05/03/2017. 3.9 second pause at 7 AM on 05/14/2017. Symptoms not reported. Occasional episodes of atypical atrial flutter with  variable conduction  Event monitor 05/06/2017 11:35 PM  Perissent Afib 4.6 seconds pause. I personally spoke with the patient. Patient does not recollect any presyncope/syncope episodes. He went to bed around 11 PM  Asymptomatic 4.6 sec pause.  EKG 04/27/2017: Atrial fibrillation controlled ventricular response. 76 bpm. Normal axis. Normal conduction. Current Plans Started Amiodarone HCl 200MG, 2 (two) Tablet As directed, #60, 60 days starting 02/08/2018,  Ref. x2. Local Order: 2 tab twice daily for 7 days, followed by 1 tab twice daily. Changed Metoprolol Succinate ER 50MG, 1 Tablet daily, #60, 60 days starting 02/08/2018, Ref. x2. Started Eliquis 2.5MG, 1 (one) Tablet Twice daily, #60, 30 days starting 02/08/2018, Ref. x1. Complete electrocardiogram (93000) Future Plans 94/70/9628: METABOLIC PANEL, BASIC (36629) - one time 02/16/2018: CBC & PLATELETS (AUTO) (47654) - one time Bilateral leg edema (R60.0) Current Plans Changed Lasix 40MG, 1 (one) Tablet As directed, #270, 90 days starting 02/08/2018, Ref. x2, Mail Order #180, 90 days, Ref. x3. Local Order: Two in am, one in pm Venous insufficiency (I87.2) Story: Bilteral varicose veins and edema CEAP class 3/S Compression stockings prescription given 01/27/2017.  Lower extremity venous duplex 04/07/2017: No evidence of deep vein thrombosis of the right lower extremity with normal venous return. No evidence of valvular incompetence (chronic venous insufficiency) of the right lower extremity. No evidence of deep vein thrombosis of the left lower extremity with normal venous return. Chronic valvular incompetence (chronic venous insufficiency) of the left lower extremity. Marked reflux noted in the left sapheno-femoral junction (2.5 s), great saphenous proximal thigh (2.6 s), great saphenous mid thigh (1.6 s), great saphenous distal thigh (3.9 s) and great saphenous proximal calf (3.4 s) veins. Essential hypertension, benign (I10) Coronary artery disease involving native heart without angina pectoris, unspecified vessel or lesion type (I25.10) Story: Coronary angiogram 08/09/2016: FFR guided CSI atherectomy followed by 3.0 x 26 and 3.0 x 18 mm onyx resolute DES to Mid LAD. Mild disease in Cx and RCA. Hypercholesteremia (E78.00) H/O: GI bleed (Z87.19)  Note:Assessment/ Recommendations:  82 year old Caucasian male with coronary artery disease status post LAD PCI, persistent atrial fibrillation,  complex valvular heart disease, mod AI, mod TR, moderate pulmonary hypertension, venous insufficiency, Parkinsons disease,GI bleed in 08/2017 from internal hemorrhoids, now s/p hemorrhoidectomy.  Valvular disease: Mod aortic, motral, tricuspid regurg, likely worse with persistnet atrial fibrilation. Start amiodarone 200 mg 2 tablets twice daily for one week, followed by one tablet twice daily, until specified otherwise. Reduce metoprolol succinate from 100 mg one tablet once daily to half tablet once daily. New prescription of 50 mg once daily given. Increase Lasix 40 mg 2 tablets in the morning, 1 tablet in the afternoon. Given renal dysfunction, switch Xarelto 15 mg to Eliquis 2.5 mg 1 tablet twice daily Plan on performing outpatient elective cardioversion in 1st week of January. We will get labs before that.  WHO Grp II secondary to left heart disease, hypertension. Management as above.  Hypertension: If systolic blood pressure consistently above 150 mmHg, patient will contact us. I will then put him on hydralazine or Bidil.  Venous insufficiency: Down the road, may need referral tro radiology for endovenous ablation. Use stockings till then.  GI bleed: S/p hemorrhoidectomy in 08/2017.Currently, no bleeding with stable Hb. Tolerating Xarelto well.  CAD: Stable. Not on aspirin due to bleeding risk. Continue statin. Repeat lipid panel in 12/2017.  Follow up after cardioversion.  Cc Dineen Kid, MD  Signed electronically by [name], MD (02/08/2018 5:34 PM)

## 2018-03-05 NOTE — H&P (Signed)
[name] 02/08/2018 1:30 PM Location: [facility] Patient #: [id number] DOB: [birth year] Divorced / Language: [name] / Race: White Male   History of Present Illness [name] MD; 02/08/2018 5:25 PM) Patient words: 4 week F/U, Last OV 01/11/18.  The patient is a 83 year old male who presents for a Follow-up for Shortness of breath. 83 year old Caucasian male with coronary artery disease status post LAD PCI, persistnet atrial fibrillation, mild aortic stenosis, venous insufficiency, Parkinsons disease. GI bleed in 08/2017 from internal hemorrhoids, now s/p hemorrhoidectomy, here for 1 month follow up.  Pelvic the patient's symptoms of leg edema, shortness of breath, admits some changes to his dictation on 01/11/2018, I resumed amlodipine 5 mg daily, increase Lasix to 40 mg twice daily. We also discussed possibility of initiation of amiodarone and repeat cardioversion attempts if he continued to be symptomatic from his atrial fibrillation.  Patient is here for follow-up today. Recent metabolic panel shows stable stage III CKD with creatinine 1.94, eGFR 32, no significant changes compared to previous BMP on 07/05/2017.  Patient continues to have exertional dyspnea, although marginally improved since last visit. However, leg swelling is significantly worse. he recently had bilateral flank pain, thought to be due to kidney stone, two weeks ago. Patient passed some blood in his urine next morning following resolution of symptoms. Hehas not had recurrence o symptoms since then.  BP elevated today on ofice monitor, as well as home monitor. BP readings much better on home checks.  Additional reasons for visit:  Follow-up for Follow up test results   Follow-up for Atrial fibrillation    Problem List/Past Medical [name]; 02/08/2018 1:44 PM) History of prostate cancer (Z85.46) [05/2009]: OV Note-Dr Carolan Clines 07/22/2016 History of prostate cancer  status post seed implantation in April 2011, now has radiation cystitis and prostatitis, mild. Microscopic eventually a mild, no further treatment surgery. Severe aortic calcification by CT chest. Venous insufficiency (I87.2)  History of melanoma (Z85.820) [2010]: Left Shoulder Constipation, acute (K59.00)  Hemorrhoidectomy [08/2017]: After GI bleeding Essential hypertension, benign (I10)  Hypercholesteremia (E78.00)  Coronary artery disease involving native heart without angina pectoris, unspecified vessel or lesion type (I25.10)  Coronary angiogram 08/09/2016: FFR guided CSI atherectomy followed by 3.0 x 26 and 3.0 x 18 mm onyx resolute DES to Mid LAD. Mild disease in Cx and RCA. Pulmonary hypertension (I27.20)  Echocardiogram 01/02/2018: Left ventricle cavity is normal in size. Moderate concentric hypertrophy of the left ventricle. Low normal global wall motion. Visual EF is 50-55%. Unable to evaluate diastolic function due to A. Fibrillation. Calculated EF 51%. Left atrial cavity is severely dilated. Right atrial cavity is moderately dilated. Trileaflet aortic valve with moderate aortic valve leaflet calcification. Trace aortic stenosis. Moderate (Grade II) aortic regurgitation. Moderate (Grade II) mitral regurgitation. Moderate tricuspid regurgitation. Mild pulmonary hypertension. Estimated pulmonary artery systolic pressure 65 mmHg. Compared to previous echocardiogram on 09/01/2016, there is interval increase in severity of aortic regurgitation, tricuspid regurgitation, and pulmonary hypertension. Laboratory examination (Z01.89)  01/02/2018: Hemoglobin 9.9, hematocrit 33.3, microcytic indices, CBC otherwise normal. Cholesterol 106, triglycerides 48, HDL 46, LDL 50. Labs 09/29/2017: H/H 7.5/24.2. MCV 90. Platelets 140. Labs 09/27/2017: Glucose 99. BUN/Cr 16/1.66. eGFR 38. Na/K 135/4.1 Labs 07/05/2017: Glucose 88. BUN/creatinine 37/1.86. EGFR 34. Sodium 139, potassium 3.6 Labs 02/10/2017:  BUN/creatinine 27/1.50. EGFR 44. Sodium 139, potassium 4.7 Creatinine improved compared to 02/02/2017 Labs 02/02/2017: BUNs/creatinine 42/1.99. EGFR 31. Sodium 139, potassium 5.0. Labs 01/26/2017: H/H 12.6/39.2. MCV 93. Platelets 147. BUNs/creatinine 22/1.54. EGFR  41. Sodium 136, potassium 4.3. Cholesterol 109, HDL 52, LDL 45, triglycerides 62 11/10/2016: BUN 35, creatinine 1.53, EGFR 43/49, potassium 4.9, bilirubin 2.3, CMP otherwise normal. Cholesterol 146, triglycerides 63, HDL 68, LDL 65. CBC normal. 09/16/2016: Creatinine 1.74, potassium 5.0, bilirubin 2.9, CMP otherwise normal. CBC normal. CKD (chronic kidney disease) stage 3, GFR 30-59 ml/min (N18.3)  Bilateral leg edema (R60.0)  Persistent atrial fibrillation (I48.19)  EKG 01/11/2018 Atrial fibrillation with controlled ventricular response. Normal axis. Normal conduction. Cannot exclude old anteroseptal infarct. No ischemic changes. CHA2DS2VASc score 3: Annual stroke risk 3.2% Event monitor 04/27/2017 - 05/26/2017: Persistent atrial fibrillation with controlled ventricular rate. Two episodes of 3 beat nonsustained VT 4.6 second pause at 11:34 PM on 05/03/2017. 3.9 second pause at 7 AM on 05/14/2017. Symptoms not reported. Occasional episodes of atypical atrial flutter with variable conduction Event monitor 05/06/2017 11:35 PM Perissent Afib 4.6 seconds pause. I personally spoke with the patient. Patient does not recollect any presyncope/syncope episodes. He went to bed around 11 PM Asymptomatic 4.6 sec pause. EKG 04/27/2017: Atrial fibrillation controlled ventricular response. 76 bpm. Normal axis. Normal conduction. H/O: GI bleed (Z87.19)  Parkinson's disease (tremor, stiffness, slow motion, unstable posture) (G20) [09/11/2016]: OV Note-Dr [name] 09/08/2016: Parkinson's disease. Paroxysmal atrial fibrillation (I48.0) [01/24/2017]: EKG 03/10/2017: Atypical atrial flutter with controlled ventricular rate 77 bpm. Normal axis. Normal conduction.  CHA2DS2VASc score 3 : Annual stroke risk 3.2% S/p successful TEE guided cardioversion 100 J on 01/26/2017  Allergies Laren Boom; February 15, 2018 1:44 PM) No Known Drug Allergies [10/27/2014]:  Family History Laren Boom; 2018/02/15 1:44 PM) Mother  Deceased. at age 29; Pt had several Strokes, MI's in her early 9's Father  Deceased. at age 90 from an MI (was his 69st) Sister 1  Deceased. from a Cerebral Hemorrhage; Older Brother 1  4 Yrs Younger; Unknown History  Social History Laren Boom; February 15, 2018 1:44 PM) Marital status  Divorced. Living Situation  Lives with Partner Current tobacco use  Never smoker. Alcohol Use  Moderate alcohol use. 1 drink/day Number of Children  1.  Past Surgical History Laren Boom; 02-15-2018 1:44 PM) Hernia Repair [1991]: Cholecystectomy [1993]: Prostate Surgery [06/25/2009]: Cataract Extraction-Left [2014]: Cataract right 02/2016, Laser treatment 05/2016 Hemorrhoid Removal [09/25/2017]:  Medication History Laren Boom; 02-15-18 1:48 PM) Gabapentin (100MG Capsule, 1 (one) Capsule Oral daily, Taken starting 01/18/2018) Active. (Pt called to advised med added by another dr) Lasix (40MG Tablet, 1 (one) Tablet Oral two times daily, Taken starting 01/17/2018) Active. amLODIPine Besylate (5MG Tablet, 1 (one) Tablet Oral once daily, Taken starting 01/11/2018) Active. Metoprolol Succinate ER (100MG Tablet ER 24HR, 1 Tablet Tablet Tablet Tablet Oral daily, Taken starting 06/02/2017) Active. Xarelto (15MG Tablet, 1 (one) Tablet Tablet Tablet T Oral Once daily, Taken starting 01/27/2017) Active. Crestor (10MG Tablet, 1 Oral at bedtime) Active. (PT CANNOT TAKE GENERIC!) Nitrostat (0.4MG Tab Sublingual, 1 (one) Tablet Tablet Tablet T Sublingual every 5 minutes as needed for chest pain., Taken starting 11/18/2016) Active. Vitamin D3 (1000UNIT Tablet, 1 Oral daily) Active. Carbidopa/Levodopa (25-100MG Tablet, 1 Oral three times daily)  Active. Tamsulosin HCl (0.4MG Capsule, 1 Oral at bedtime) Active. PreserVision AREDS 2 (1 Oral two times daily) Active. Medications Reconciled (verbally with patient/ list present)  Diagnostic Studies History Laren Boom; 02-15-2018 1:44 PM) Echocardiogram  01/02/2018: Left ventricle cavity is normal in size. Moderate concentric hypertrophy of the left ventricle. Low normal global wall motion. Visual EF is 50-55%. Unable to evaluate diastolic function due to A. Fibrillation. Calculated EF 51%. Left atrial cavity  is severely dilated. Right atrial cavity is moderately dilated. Trileaflet aortic valve with moderate aortic valve leaflet calcification. Trace aortic stenosis. Moderate (Grade II) aortic regurgitation. Moderate (Grade II) mitral regurgitation. Moderate tricuspid regurgitation. Mild pulmonary hypertension. Estimated pulmonary artery systolic pressure 65 mmHg. Compared to previous echocardiogram on 09/01/2016, there is interval increase in severity of aortic regurgitation, tricuspid regurgitation, and pulmonary hypertension. CT Scan of Abdomen [07/08/2016]: Elevated right hemidiaphragm. Atherosclerotic calcification in all 3 coronary vessels including left main, severe calcification of the aortic valve. Aortic atherosclerosis is evident. Colonoscopy [09/21/2017]: Lower Extremity Dopplers [06/2009]: Pt has Baker Cyst behind both knees Event Monitor [04/27/2017]: Event monitor 04/27/2017 - 05/26/2017: Persistent atrial fibrillation with controlled ventricular rate. Two episodes of 3 beat nonsustained VT 4.6 second pause at 11:34 PM on 05/03/2017. 3.9 second pause at 7 AM on 05/14/2017. Symptoms not reported. Occasional episodes of atypical atrial flutter with variable conduction Echocardiogram [09/01/2016]: Left ventricle cavity is normal in size. Moderate concentric hypertrophy of the left ventricle. Normal global wall motion. Doppler evidence of grade II (pseudonormal)  diastolic dysfunction. Diastolic dysfunction findings suggests elevated LA/LV end diastolic pressure. Visual EF is 55-60%. Left atrial cavity is moderately dilated at 4.5 cm. Trileaflet aortic valve with mild (Grade I) regurgitation. Mild calcification of the aortic valve annulus. Moderate aortic valve leaflet calcification. Moderately restricted aortic valve leaflets. Trace aortic valve stenosis. Mild (Grade I) mitral regurgitation. Mild to moderate tricuspid regurgitation. Mild pulmonary hypertension. Pulmonary artery systolic pressure is estimated at 32 mm Hg. The aortic root is normal in size at 3.5 cm. Mild atherosclerotic changes in the aorta. No significant change from Echocardiogram 01/05/2011. Cardioversion [01/26/2017]: S/p successful TEE guided cardioversion 100 J Coronary Angiogram [08/09/2016]: FFR guided CSI atherectomy followed by 3.0 x 26 and 3.0 x 18 mm onyx resolute DES to mid LAD. Mild disease in Cx and RCA. Normal LVEF. Mild pulmonary hypertension. Colonoscopy [10/22/2014]: Results pending; Removed several Polyps; Diverticulitis Lower Extremity Dopplers [04/07/2017]: Lower extremity venous duplex 04/07/2017: No evidence of deep vein thrombosis of the right lower extremity with normal venous return. No evidence of valvular incompetence (chronic venous insufficiency) of the right lower extremity. No evidence of deep vein thrombosis of the left lower extremity with normal venous return. Chronic valvular incompetence (chronic venous insufficiency) of the left lower extremity. Marked reflux noted in the left sapheno-femoral junction (2.5 s), great saphenous proximal thigh (2.6 s), great saphenous mid thigh (1.6 s), great saphenous distal thigh (3.9 s) and great saphenous proximal calf (3.4 s) veins.    Review of Systems Joya Gaskins Esther Hardy MD; 02/08/2018 5:26 PM) General Not Present- Appetite Loss and Weight Gain. Respiratory Not Present- Chronic Cough and Wakes up from Sleep  Wheezing or Short of Breath. Cardiovascular Present- Edema (Worsening. Has weeping and blisters on skin). Not Present- Chest Pain, Claudications, Difficulty Breathing Lying Down, Difficulty Breathing On Exertion, Orthopnea and Paroxysmal Nocturnal Dyspnea. Gastrointestinal Not Present- Black, Tarry Stool and Difficulty Swallowing. Note: Recent hemorrhoidectomy. Pain at the site. No frank bleeding Musculoskeletal Present- Joint Pain (Bilateral knees). Not Present- Decreased Range of Motion and Muscle Atrophy. Neurological Not Present- Attention Deficit. Psychiatric Not Present- Personality Changes and Suicidal Ideation. Endocrine Not Present- Cold Intolerance and Heat Intolerance. Hematology Not Present- Abnormal Bleeding. All other systems negative  Vitals [name] MD; 02/08/2018 1:59 PM) 02/08/2018 1:39 PM Weight: 224.38 lb Height: 71in Body Surface Area: 2.21 m Body Mass Index: 31.29 kg/m  Pulse: 67 (Irregular)  P.OX: 98% (Room air) BP: 163/79 (Sitting, Left Arm,  Standard)       Physical Exam [name] MD; 02/08/2018 5:27 PM) General Mental Status-Alert. General Appearance-Cooperative and Appears stated age. Build & Nutrition-Moderately built.  Head and Neck Thyroid Gland Characteristics - normal size and consistency and no palpable nodules.  Chest and Lung Exam Chest and lung exam reveals -quiet, even and easy respiratory effort with no use of accessory muscles, non-tender and on auscultation, normal breath sounds, no adventitious sounds.  Cardiovascular Cardiovascular examination reveals -carotid auscultation reveals no bruits, abdominal aorta auscultation reveals no bruits and no prominent pulsation and femoral artery auscultation bilaterally reveals normal pulses, no bruits, no thrills. Auscultation Rhythm - Irregularly irregular. Murmurs & Other Heart Sounds: Murmur - Location - Aortic Area. Timing - Mid-systolic. Grade -  III/VI.  Abdomen Palpation/Percussion Normal exam - Non Tender and No hepatosplenomegaly.  Peripheral Vascular Lower Extremity  Inspection: Note: Blisters. Palpation - Edema - Bilateral - 2+ Pitting edema. Note: bilateral venous insufficiency. Dorsalis pedis pulse - Bilateral - Note: Difficult to appreciate due to edema. Posterior tibial pulse - Bilateral - Note: Difficult to appreciate due to edema. Carotid arteries - Bilateral-No Carotid bruit.  Neurologic Neurologic evaluation reveals -alert and oriented x 3 with no impairment of recent or remote memory. Motor-Grossly intact without any focal deficits. Note: Stable tremors. Sees Dr. Carles Collet for Parkinspons disease   Musculoskeletal Global Assessment Left Lower Extremity - no deformities, masses or tenderness, no known fractures. Right Lower Extremity - no deformities, masses or tenderness, no known fractures.   Results [name] MD; 02/08/2018 5:34 PM) Labs  Name Value Range Date METABOLIC PANEL, BASIC (27741)   Collected: 02/01/2018 11:46 AM Glucose 93 mg/dL 65-99 mg/dL Collected: 02/01/2018 11:46 AM BUN 28 mg/dL 8-27 mg/dL Collected: 02/01/2018 11:46 AM Creatinine 1.94 mg/dL 0.76-1.27 mg/dL Collected: 02/01/2018 11:46 AM eGFR If NonAfricn Am 32 mL/min/1.73 >59 mL/min/1.73 Collected: 02/01/2018 11:46 AM eGFR If Africn Am 37 mL/min/1.73 >59 mL/min/1.73 Collected: 02/01/2018 11:46 AM BUN/Creatinine Ratio 14 10-24 Collected: 02/01/2018 11:46 AM Sodium 145 mmol/L 134-144 mmol/L Collected: 02/01/2018 11:46 AM Potassium 3.9 mmol/L 3.5-5.2 mmol/L Collected: 02/01/2018 11:46 AM Chloride 99 mmol/L 96-106 mmol/L Collected: 02/01/2018 11:46 AM Carbon Dioxide, Total 26 mmol/L 20-29 mmol/L Collected: 02/01/2018 11:46 AM Calcium 9.5 mg/dL 8.6-10.2 mg/dL Collected: 02/01/2018 28:78 AM Basic Metabolic Panel (8)   Collected: 07/05/2017  11:11 AM Glucose 88 mg/dL 65-99 mg/dL Collected: 07/05/2017 11:11 AM BUN 37 mg/dL 8-27 mg/dL Collected: 07/05/2017 11:11 AM Creatinine 1.86 mg/dL 0.76-1.27 mg/dL Collected: 07/05/2017 11:11 AM eGFR If NonAfricn Am 34 mL/min/1.73 >59 mL/min/1.73 Collected: 07/05/2017 11:11 AM eGFR If Africn Am 39 mL/min/1.73 >59 mL/min/1.73 Collected: 07/05/2017 11:11 AM BUN/Creatinine Ratio 20 10-24 Collected: 07/05/2017 11:11 AM Sodium 139 mmol/L 134-144 mmol/L Collected: 07/05/2017 11:11 AM Potassium 3.6 mmol/L 3.5-5.2 mmol/L Collected: 07/05/2017 11:11 AM Chloride 97 mmol/L 96-106 mmol/L Collected: 07/05/2017 11:11 AM Carbon Dioxide, Total 23 mmol/L 20-29 mmol/L Collected: 07/05/2017 11:11 AM Calcium 9.4 mg/dL 8.6-10.2 mg/dL Collected: 07/05/2017 11:11 AM    Assessment & Plan Joya Gaskins Esther Hardy MD; 02/08/2018 5:34 PM) Persistent atrial fibrillation (I48.19) Story: EKG 01/11/2018 Atrial fibrillation with controlled ventricular response. Normal axis. Normal conduction. Cannot exclude old anteroseptal infarct. No ischemic changes.  CHA2DS2VASc score 3: Annual stroke risk 3.2%  Event monitor 04/27/2017 - 05/26/2017: Persistent atrial fibrillation with controlled ventricular rate. Two episodes of 3 beat nonsustained VT 4.6 second pause at 11:34 PM on 05/03/2017. 3.9 second pause at 7 AM on 05/14/2017. Symptoms not reported. Occasional episodes of atypical atrial flutter with  variable conduction  Event monitor 05/06/2017 11:35 PM  Perissent Afib 4.6 seconds pause. I personally spoke with the patient. Patient does not recollect any presyncope/syncope episodes. He went to bed around 11 PM  Asymptomatic 4.6 sec pause.  EKG 04/27/2017: Atrial fibrillation controlled ventricular response. 76 bpm. Normal axis. Normal conduction. Current Plans Started Amiodarone HCl 200MG, 2 (two) Tablet As directed, #60, 60 days starting 02/08/2018,  Ref. x2. Local Order: 2 tab twice daily for 7 days, followed by 1 tab twice daily. Changed Metoprolol Succinate ER 50MG, 1 Tablet daily, #60, 60 days starting 02/08/2018, Ref. x2. Started Eliquis 2.5MG, 1 (one) Tablet Twice daily, #60, 30 days starting 02/08/2018, Ref. x1. Complete electrocardiogram (93000) Future Plans 94/70/9628: METABOLIC PANEL, BASIC (36629) - one time 02/16/2018: CBC & PLATELETS (AUTO) (47654) - one time Bilateral leg edema (R60.0) Current Plans Changed Lasix 40MG, 1 (one) Tablet As directed, #270, 90 days starting 02/08/2018, Ref. x2, Mail Order #180, 90 days, Ref. x3. Local Order: Two in am, one in pm Venous insufficiency (I87.2) Story: Bilteral varicose veins and edema CEAP class 3/S Compression stockings prescription given 01/27/2017.  Lower extremity venous duplex 04/07/2017: No evidence of deep vein thrombosis of the right lower extremity with normal venous return. No evidence of valvular incompetence (chronic venous insufficiency) of the right lower extremity. No evidence of deep vein thrombosis of the left lower extremity with normal venous return. Chronic valvular incompetence (chronic venous insufficiency) of the left lower extremity. Marked reflux noted in the left sapheno-femoral junction (2.5 s), great saphenous proximal thigh (2.6 s), great saphenous mid thigh (1.6 s), great saphenous distal thigh (3.9 s) and great saphenous proximal calf (3.4 s) veins. Essential hypertension, benign (I10) Coronary artery disease involving native heart without angina pectoris, unspecified vessel or lesion type (I25.10) Story: Coronary angiogram 08/09/2016: FFR guided CSI atherectomy followed by 3.0 x 26 and 3.0 x 18 mm onyx resolute DES to Mid LAD. Mild disease in Cx and RCA. Hypercholesteremia (E78.00) H/O: GI bleed (Z87.19)  Note:Assessment/ Recommendations:  82 year old Caucasian male with coronary artery disease status post LAD PCI, persistent atrial fibrillation,  complex valvular heart disease, mod AI, mod TR, moderate pulmonary hypertension, venous insufficiency, Parkinsons disease,GI bleed in 08/2017 from internal hemorrhoids, now s/p hemorrhoidectomy.  Valvular disease: Mod aortic, motral, tricuspid regurg, likely worse with persistnet atrial fibrilation. Start amiodarone 200 mg 2 tablets twice daily for one week, followed by one tablet twice daily, until specified otherwise. Reduce metoprolol succinate from 100 mg one tablet once daily to half tablet once daily. New prescription of 50 mg once daily given. Increase Lasix 40 mg 2 tablets in the morning, 1 tablet in the afternoon. Given renal dysfunction, switch Xarelto 15 mg to Eliquis 2.5 mg 1 tablet twice daily Plan on performing outpatient elective cardioversion in 1st week of January. We will get labs before that.  WHO Grp II secondary to left heart disease, hypertension. Management as above.  Hypertension: If systolic blood pressure consistently above 150 mmHg, patient will contact us. I will then put him on hydralazine or Bidil.  Venous insufficiency: Down the road, may need referral tro radiology for endovenous ablation. Use stockings till then.  GI bleed: S/p hemorrhoidectomy in 08/2017.Currently, no bleeding with stable Hb. Tolerating Xarelto well.  CAD: Stable. Not on aspirin due to bleeding risk. Continue statin. Repeat lipid panel in 12/2017.  Follow up after cardioversion.  Cc Dineen Kid, MD  Signed electronically by [name], MD (02/08/2018 5:34 PM)

## 2018-03-06 ENCOUNTER — Encounter (HOSPITAL_COMMUNITY): Payer: Self-pay | Admitting: *Deleted

## 2018-03-06 ENCOUNTER — Ambulatory Visit (HOSPITAL_COMMUNITY): Payer: Medicare Other | Admitting: Certified Registered Nurse Anesthetist

## 2018-03-06 ENCOUNTER — Ambulatory Visit (HOSPITAL_COMMUNITY)
Admission: RE | Admit: 2018-03-06 | Discharge: 2018-03-06 | Disposition: A | Discharge status: Home / Self Care | Payer: Medicare Other | Attending: Cardiology | Admitting: Cardiology

## 2018-03-06 ENCOUNTER — Encounter (HOSPITAL_COMMUNITY)
Admission: RE | Disposition: A | Discharge status: Home / Self Care | Payer: Self-pay | Source: Home / Self Care | Attending: Cardiology

## 2018-03-06 ENCOUNTER — Other Ambulatory Visit: Payer: Self-pay

## 2018-03-06 DIAGNOSIS — Z7901 Long term (current) use of anticoagulants: Secondary | ICD-10-CM | POA: Insufficient documentation

## 2018-03-06 DIAGNOSIS — I872 Venous insufficiency (chronic) (peripheral): Secondary | ICD-10-CM | POA: Diagnosis not present

## 2018-03-06 DIAGNOSIS — I4891 Unspecified atrial fibrillation: Secondary | ICD-10-CM | POA: Diagnosis not present

## 2018-03-06 DIAGNOSIS — G20 Parkinson's disease: Secondary | ICD-10-CM | POA: Insufficient documentation

## 2018-03-06 DIAGNOSIS — M1990 Unspecified osteoarthritis, unspecified site: Secondary | ICD-10-CM | POA: Insufficient documentation

## 2018-03-06 DIAGNOSIS — I272 Pulmonary hypertension, unspecified: Secondary | ICD-10-CM | POA: Insufficient documentation

## 2018-03-06 DIAGNOSIS — N183 Chronic kidney disease, stage 3 (moderate): Secondary | ICD-10-CM | POA: Insufficient documentation

## 2018-03-06 DIAGNOSIS — E7800 Pure hypercholesterolemia, unspecified: Secondary | ICD-10-CM | POA: Diagnosis not present

## 2018-03-06 DIAGNOSIS — I4819 Other persistent atrial fibrillation: Secondary | ICD-10-CM | POA: Diagnosis not present

## 2018-03-06 DIAGNOSIS — I2510 Atherosclerotic heart disease of native coronary artery without angina pectoris: Secondary | ICD-10-CM | POA: Insufficient documentation

## 2018-03-06 DIAGNOSIS — I129 Hypertensive chronic kidney disease with stage 1 through stage 4 chronic kidney disease, or unspecified chronic kidney disease: Secondary | ICD-10-CM | POA: Insufficient documentation

## 2018-03-06 DIAGNOSIS — Z955 Presence of coronary angioplasty implant and graft: Secondary | ICD-10-CM | POA: Insufficient documentation

## 2018-03-06 DIAGNOSIS — I4892 Unspecified atrial flutter: Secondary | ICD-10-CM | POA: Diagnosis not present

## 2018-03-06 HISTORY — PX: CARDIOVERSION: SHX1299

## 2018-03-06 SURGERY — CARDIOVERSION
Anesthesia: General

## 2018-03-06 MED ORDER — PROPOFOL 10 MG/ML IV BOLUS
INTRAVENOUS | Status: DC | PRN
  Administered 2018-03-06: 80 mg via INTRAVENOUS

## 2018-03-06 MED ORDER — SODIUM CHLORIDE 0.9 % IV SOLN
INTRAVENOUS | Status: DC | PRN
  Administered 2018-03-06: 13:00:00 via INTRAVENOUS

## 2018-03-06 MED ORDER — LIDOCAINE 2% (20 MG/ML) 5 ML SYRINGE
INTRAMUSCULAR | Status: DC | PRN
  Administered 2018-03-06: 80 mg via INTRAVENOUS

## 2018-03-06 NOTE — CV Procedure (Signed)
Direct current cardioversion:  Indication symptomatic A. Fibrillation.  Procedure: Under deep sedation administered and monitored by anesthesiology, synchronized direct current cardioversion performed. Patient was delivered with 120 Joules of electricity X 1 with success to NSR. Patient tolerated the procedure well. No immediate complication noted.   Manish J Patwardhan, MD [facility] Pager: [phone] Office: [phone] If no answer Cell [phone]    

## 2018-03-06 NOTE — Interval H&P Note (Signed)
History and Physical Interval Note:  03/06/2018 12:48 PM  [name]  has presented today for surgery, with the diagnosis of AFIB  The various methods of treatment have been discussed with the patient and family. After consideration of risks, benefits and other options for treatment, the patient has consented to  Procedure(s): CARDIOVERSION (N/A) as a surgical intervention .  The patient's history has been reviewed, patient examined, no change in status, stable for surgery.  I have reviewed the patient's chart and labs.  Questions were answered to the patient's satisfaction.     [place]

## 2018-03-06 NOTE — Anesthesia Procedure Notes (Signed)
Procedure Name: General with mask airway Date/Time: 03/06/2018 12:55 PM Performed by: Candis Shine, CRNA Pre-anesthesia Checklist: Patient identified, Suction available, Emergency Drugs available, Patient being monitored and Timeout performed Patient Re-evaluated:Patient Re-evaluated prior to induction Oxygen Delivery Method: Ambu bag Preoxygenation: Pre-oxygenation with 100% oxygen Induction Type: IV induction Dental Injury: Teeth and Oropharynx as per pre-operative assessment

## 2018-03-06 NOTE — Anesthesia Preprocedure Evaluation (Signed)
Anesthesia Evaluation  Patient identified by MRN, date of birth, ID band Patient awake    Reviewed: Allergy & Precautions, NPO status , Patient's Chart, lab work & pertinent test results  History of Anesthesia Complications Negative for: history of anesthetic complications  Airway Mallampati: II  TM Distance: >3 FB Neck ROM: Full    Dental  (+) Dental Advisory Given, Teeth Intact   Pulmonary neg pulmonary ROS,    breath sounds clear to auscultation       Cardiovascular hypertension, Pt. on home beta blockers and Pt. on medications + CAD  + dysrhythmias Atrial Fibrillation + Valvular Problems/Murmurs AS  Rhythm:Regular Rate:Normal     Neuro/Psych negative neurological ROS     GI/Hepatic negative GI ROS, Neg liver ROS,   Endo/Other    Renal/GU Renal InsufficiencyRenal disease     Musculoskeletal  (+) Arthritis , Osteoarthritis,    Abdominal Normal abdominal exam  (+)   Peds  Hematology  (+) anemia ,   Anesthesia Other Findings - Parkinson's Disease  Reproductive/Obstetrics                             Anesthesia Physical  Anesthesia Plan  ASA: III  Anesthesia Plan: General   Post-op Pain Management:    Induction: Intravenous  PONV Risk Score and Plan: 2 and Ondansetron and Treatment may vary due to age or medical condition  Airway Management Planned: Mask  Additional Equipment: None  Intra-op Plan:   Post-operative Plan: Extubation in OR  Informed Consent: I have reviewed the patients History and Physical, chart, labs and discussed the procedure including the risks, benefits and alternatives for the proposed anesthesia with the patient or authorized representative who has indicated his/her understanding and acceptance.   Dental advisory given  Plan Discussed with: CRNA and Surgeon  Anesthesia Plan Comments:         Anesthesia Quick Evaluation

## 2018-03-06 NOTE — Transfer of Care (Signed)
Immediate Anesthesia Transfer of Care Note  Patient: [name]  Procedure(s) Performed: CARDIOVERSION (N/A )  Patient Location: Endoscopy Unit  Anesthesia Type:General  Level of Consciousness: awake, alert  and oriented  Airway & Oxygen Therapy: Patient Spontanous Breathing  Post-op Assessment: Report given to RN and Post -op Vital signs reviewed and stable  Post vital signs: Reviewed and stable  Last Vitals:  Vitals Value Taken Time  BP 134/56 03/06/2018 12:58 PM  Temp    Pulse 70 03/06/2018  1:01 PM  Resp 12 03/06/2018  1:01 PM  SpO2 93 % 03/06/2018  1:01 PM    Last Pain:  Vitals:   03/06/18 1132  TempSrc: Oral  PainSc: 0-No pain         Complications: No apparent anesthesia complications

## 2018-03-06 NOTE — Anesthesia Postprocedure Evaluation (Signed)
Anesthesia Post Note  Patient: Jordanny Waddington  Procedure(s) Performed: CARDIOVERSION (N/A )     Patient location during evaluation: Endoscopy Anesthesia Type: General Level of consciousness: awake and alert Pain management: pain level controlled Vital Signs Assessment: post-procedure vital signs reviewed and stable Respiratory status: spontaneous breathing, nonlabored ventilation and respiratory function stable Cardiovascular status: blood pressure returned to baseline and stable Postop Assessment: no apparent nausea or vomiting Anesthetic complications: no    Last Vitals:  Vitals:   03/06/18 1325 03/06/18 1335  BP: 131/72 (!) 151/70  Pulse: 68 68  Resp: 17 17  Temp:    SpO2: 97% 100%    Last Pain:  Vitals:   03/06/18 1335  TempSrc:   PainSc: 0-No pain                 Lynda Rainwater

## 2018-03-07 ENCOUNTER — Encounter (HOSPITAL_COMMUNITY): Payer: Self-pay | Admitting: Cardiology

## 2018-03-14 DIAGNOSIS — I480 Paroxysmal atrial fibrillation: Secondary | ICD-10-CM | POA: Diagnosis not present

## 2018-03-14 DIAGNOSIS — I2510 Atherosclerotic heart disease of native coronary artery without angina pectoris: Secondary | ICD-10-CM | POA: Diagnosis not present

## 2018-03-14 DIAGNOSIS — I872 Venous insufficiency (chronic) (peripheral): Secondary | ICD-10-CM | POA: Diagnosis not present

## 2018-03-14 DIAGNOSIS — N183 Chronic kidney disease, stage 3 (moderate): Secondary | ICD-10-CM | POA: Diagnosis not present

## 2018-03-14 DIAGNOSIS — I272 Pulmonary hypertension, unspecified: Secondary | ICD-10-CM | POA: Diagnosis not present

## 2018-03-14 DIAGNOSIS — R600 Localized edema: Secondary | ICD-10-CM | POA: Diagnosis not present

## 2018-03-16 DIAGNOSIS — I071 Rheumatic tricuspid insufficiency: Secondary | ICD-10-CM

## 2018-03-16 DIAGNOSIS — I340 Nonrheumatic mitral (valve) insufficiency: Secondary | ICD-10-CM

## 2018-03-16 DIAGNOSIS — I272 Pulmonary hypertension, unspecified: Secondary | ICD-10-CM

## 2018-03-16 NOTE — H&P (Signed)
[name] 03/14/2018 11:45 AM Location: [facility] Patient #: [phone] DOB: 15-Jan-1936 Divorced / Language: [name] / Race: White Male   History of Present Illness [name] Mormon MD; 03/14/2018 12:57 PM) Patient words: 7-10 day fu cardioversion; last OV 02/08/18.  The patient is a 83 year old male who presents for a Follow-up for Atrial fibrillation. 83 year old Caucasian male with coronary artery disease status (LAD PCI 2018), paroxysmal atrial fibrillation, complex valvular heart disease, mod AI, mod TR, moderate pulmonary hypertension, venous insufficiency, Parkinsons disease, GI bleed in 08/2017 from internal hemorrhoids, now s/p hemorrhoidectomy.  Patient underwent successful cardivoersion on 03/06/2018. He is maintaining sinus rhythm since then. he has lost 9 lbs since his last visit. Hwoever, he continues to have lifestlye limiting exertional dyspnea and leg swelling. He deneis any chest pain. His dizziness has copletely resolved after stopping Lyrica.  Additional reasons for visit:  Follow-up for Shortness of breath   Follow-up for Follow up test results    Problem List/Past Medical [name]; 03/14/2018 11:45 AM) History of prostate cancer (Z85.46) [05/2009]: OV Note-Dr Carolan Clines 07/22/2016 History of prostate cancer status post seed implantation in April 2011, now has radiation cystitis and prostatitis, mild. Microscopic eventually a mild, no further treatment surgery. Severe aortic calcification by CT chest. Venous insufficiency (I87.2)  History of melanoma (Z85.820) [2010]: Left Shoulder Constipation, acute (K59.00)  Hemorrhoidectomy [08/2017]: After GI bleeding Essential hypertension, benign (I10)  Hypercholesteremia (E78.00)  Coronary artery disease involving native heart without angina pectoris, unspecified vessel or lesion type (I25.10)  Coronary angiogram 08/09/2016: FFR guided CSI atherectomy followed by 3.0 x 26 and 3.0  x 18 mm onyx resolute DES to Mid LAD. Mild disease in Cx and RCA. Pulmonary hypertension (I27.20)  Echocardiogram 01/02/2018: Left ventricle cavity is normal in size. Moderate concentric hypertrophy of the left ventricle. Low normal global wall motion. Visual EF is 50-55%. Unable to evaluate diastolic function due to A. Fibrillation. Calculated EF 51%. Left atrial cavity is severely dilated. Right atrial cavity is moderately dilated. Trileaflet aortic valve with moderate aortic valve leaflet calcification. Trace aortic stenosis. Moderate (Grade II) aortic regurgitation. Moderate (Grade II) mitral regurgitation. Moderate tricuspid regurgitation. Mild pulmonary hypertension. Estimated pulmonary artery systolic pressure 65 mmHg. Compared to previous echocardiogram on 09/01/2016, there is interval increase in severity of aortic regurgitation, tricuspid regurgitation, and pulmonary hypertension. Laboratory examination (Z01.89)  01/02/2018: Hemoglobin 9.9, hematocrit 33.3, microcytic indices, CBC otherwise normal. Cholesterol 106, triglycerides 48, HDL 46, LDL 50. Labs 09/29/2017: H/H 7.5/24.2. MCV 90. Platelets 140. Labs 09/27/2017: Glucose 99. BUN/Cr 16/1.66. eGFR 38. Na/K 135/4.1 Labs 07/05/2017: Glucose 88. BUN/creatinine 37/1.86. EGFR 34. Sodium 139, potassium 3.6 Labs 02/10/2017: BUN/creatinine 27/1.50. EGFR 44. Sodium 139, potassium 4.7 Creatinine improved compared to 02/02/2017 Labs 02/02/2017: BUNs/creatinine 42/1.99. EGFR 31. Sodium 139, potassium 5.0. Labs 01/26/2017: H/H 12.6/39.2. MCV 93. Platelets 147. BUNs/creatinine 22/1.54. EGFR 41. Sodium 136, potassium 4.3. Cholesterol 109, HDL 52, LDL 45, triglycerides 62 11/10/2016: BUN 35, creatinine 1.53, EGFR 43/49, potassium 4.9, bilirubin 2.3, CMP otherwise normal. Cholesterol 146, triglycerides 63, HDL 68, LDL 65. CBC normal. 09/16/2016: Creatinine 1.74, potassium 5.0, bilirubin 2.9, CMP otherwise normal. CBC normal. CKD (chronic kidney disease) stage 3, GFR  30-59 ml/min (N18.3)  Bilateral leg edema (R60.0)  Persistent atrial fibrillation (I48.19)  EKG 01/11/2018 Atrial fibrillation with controlled ventricular response. Normal axis. Normal conduction. Cannot exclude old anteroseptal infarct. No ischemic changes. CHA2DS2VASc score 3: Annual stroke risk 3.2% Event monitor 04/27/2017 - 05/26/2017: Persistent atrial fibrillation with controlled  ventricular rate. Two episodes of 3 beat nonsustained VT 4.6 second pause at 11:34 PM on 05/03/2017. 3.9 second pause at 7 AM on 05/14/2017. Symptoms not reported. Occasional episodes of atypical atrial flutter with variable conduction Event monitor 05/06/2017 11:35 PM Perissent Afib 4.6 seconds pause. I personally spoke with the patient. Patient does not recollect any presyncope/syncope episodes. He went to bed around 11 PM Asymptomatic 4.6 sec pause. EKG 04/27/2017: Atrial fibrillation controlled ventricular response. 76 bpm. Normal axis. Normal conduction. H/O: GI bleed (Z87.19)  Parkinson's disease (tremor, stiffness, slow motion, unstable posture) (G20) [09/11/2016]: OV Note-Dr [name] 09/08/2016: Parkinson's disease. Paroxysmal atrial fibrillation (I48.0) [01/24/2017]: 03/06/2018: Successful cardioverion 120 J EKG 03/10/2017: Atypical atrial flutter with controlled ventricular rate 77 bpm. Normal axis. Normal conduction. CHA2DS2VASc score 3 : Annual stroke risk 3.2% S/p successful TEE guided cardioversion 100 J on 01/26/2017  Allergies [name]; 2018-04-11 11:45 AM) No Known Drug Allergies [10/27/2014]:  Family History [name]; Apr 11, 2018 11:45 AM) Mother  Deceased. at age 20; Pt had several Strokes, MI's in her early 60's Father  Deceased. at age 89 from an MI (was his 5st) Sister 1  Deceased. from a Cerebral Hemorrhage; Older Brother 1  4 Yrs Younger; Unknown History  Social History [name]; 04/11/18 11:45 AM) Marital status  Divorced. Living Situation  Lives with  Partner Current tobacco use  Never smoker. Alcohol Use  Moderate alcohol use. 1 drink/day Number of Children  1.  Past Surgical History [name]; 04/11/18 11:45 AM) Hernia Repair [1991]: Cholecystectomy [1993]: Prostate Surgery [06/25/2009]: Cataract Extraction-Left [2014]: Cataract right 02/2016, Laser treatment 05/2016 Hemorrhoid Removal [09/25/2017]:  Medication History [name]; 11-Apr-2018 11:53 AM) Lasix (40MG Tablet, 1 (one) Tablet Oral As directed, Taken starting 02/08/2018) Active. (Two in am, one in pm) Amiodarone HCl (200MG Tablet, 2 (two) Tablet Oral As directed, Taken starting 02/08/2018) Active. (2 tab twice daily for 7 days, followed by 1 tab twice daily.) Eliquis (2.5MG Tablet, 1 (one) Tablet Oral Twice daily, Taken starting 02/08/2018) Active. Gabapentin (300MG Tablet, 1 (one) Capsule Capsule Oral daily, Taken starting 01/18/2018) Active. (Pt called to advised med added by another dr) amLODIPine Besylate (5MG Tablet, 1 (one) Tablet Tablet Oral once daily, Taken starting 01/11/2018) Active. Nitrostat (0.4MG Tab Sublingual, 1 (one) Tablet Tablet Tablet T Sublingual every 5 minutes as needed for chest pain., Taken starting 11/18/2016) Active. Crestor (10MG Tablet, 1 Oral at bedtime) Active. (PT CANNOT TAKE GENERIC!) Vitamin D3 (1000UNIT Tablet, 1 Oral daily) Active. PreserVision AREDS 2 (1 Oral two times daily) Active. Carbidopa/Levodopa (25-100MG Tablet, 1 Oral three times daily) Active. Medications Reconciled (verbally with patient/ list present)  Diagnostic Studies History [name]; 04/11/2018 11:45 AM) Echocardiogram  01/02/2018: Left ventricle cavity is normal in size. Moderate concentric hypertrophy of the left ventricle. Low normal global wall motion. Visual EF is 50-55%. Unable to evaluate diastolic function due to A. Fibrillation. Calculated EF 51%. Left atrial cavity is severely dilated. Right atrial cavity is moderately  dilated. Trileaflet aortic valve with moderate aortic valve leaflet calcification. Trace aortic stenosis. Moderate (Grade II) aortic regurgitation. Moderate (Grade II) mitral regurgitation. Moderate tricuspid regurgitation. Mild pulmonary hypertension. Estimated pulmonary artery systolic pressure 65 mmHg. Compared to previous echocardiogram on 09/01/2016, there is interval increase in severity of aortic regurgitation, tricuspid regurgitation, and pulmonary hypertension. CT Scan of Abdomen [07/08/2016]: Elevated right hemidiaphragm. Atherosclerotic calcification in all 3 coronary vessels including left main, severe calcification of the aortic valve. Aortic atherosclerosis is evident. Colonoscopy [09/21/2017]: Lower Extremity Dopplers [06/2009]:  Pt has Baker Cyst behind both knees Event Monitor [04/27/2017]: Event monitor 04/27/2017 - 05/26/2017: Persistent atrial fibrillation with controlled ventricular rate. Two episodes of 3 beat nonsustained VT 4.6 second pause at 11:34 PM on 05/03/2017. 3.9 second pause at 7 AM on 05/14/2017. Symptoms not reported. Occasional episodes of atypical atrial flutter with variable conduction Echocardiogram [09/01/2016]: Left ventricle cavity is normal in size. Moderate concentric hypertrophy of the left ventricle. Normal global wall motion. Doppler evidence of grade II (pseudonormal) diastolic dysfunction. Diastolic dysfunction findings suggests elevated LA/LV end diastolic pressure. Visual EF is 55-60%. Left atrial cavity is moderately dilated at 4.5 cm. Trileaflet aortic valve with mild (Grade I) regurgitation. Mild calcification of the aortic valve annulus. Moderate aortic valve leaflet calcification. Moderately restricted aortic valve leaflets. Trace aortic valve stenosis. Mild (Grade I) mitral regurgitation. Mild to moderate tricuspid regurgitation. Mild pulmonary hypertension. Pulmonary artery systolic pressure is estimated at 32 mm Hg. The aortic root is  normal in size at 3.5 cm. Mild atherosclerotic changes in the aorta. No significant change from Echocardiogram 01/05/2011. Cardioversion [01/26/2017]: S/p successful TEE guided cardioversion 100 J Coronary Angiogram [08/09/2016]: FFR guided CSI atherectomy followed by 3.0 x 26 and 3.0 x 18 mm onyx resolute DES to mid LAD. Mild disease in Cx and RCA. Normal LVEF. Mild pulmonary hypertension. Colonoscopy [10/22/2014]: Results pending; Removed several Polyps; Diverticulitis Lower Extremity Dopplers [04/07/2017]: Lower extremity venous duplex 04/07/2017: No evidence of deep vein thrombosis of the right lower extremity with normal venous return. No evidence of valvular incompetence (chronic venous insufficiency) of the right lower extremity. No evidence of deep vein thrombosis of the left lower extremity with normal venous return. Chronic valvular incompetence (chronic venous insufficiency) of the left lower extremity. Marked reflux noted in the left sapheno-femoral junction (2.5 s), great saphenous proximal thigh (2.6 s), great saphenous mid thigh (1.6 s), great saphenous distal thigh (3.9 s) and great saphenous proximal calf (3.4 s) veins.    Review of Systems Joya Gaskins Esther Hardy MD; 03/14/2018 12:57 PM) General Not Present- Appetite Loss and Weight Gain. Respiratory Not Present- Chronic Cough and Wakes up from Sleep Wheezing or Short of Breath. Cardiovascular Present- Difficulty Breathing On Exertion and Edema. Not Present- Chest Pain, Claudications, Difficulty Breathing Lying Down, Orthopnea and Paroxysmal Nocturnal Dyspnea. Gastrointestinal Not Present- Black, Tarry Stool and Difficulty Swallowing. Note: Recent hemorrhoidectomy. Pain at the site. No frank bleeding Musculoskeletal Present- Joint Pain (Bilateral knees). Not Present- Decreased Range of Motion and Muscle Atrophy. Neurological Not Present- Attention Deficit. Psychiatric Not Present- Personality Changes and Suicidal  Ideation. Endocrine Not Present- Cold Intolerance and Heat Intolerance. Hematology Not Present- Abnormal Bleeding. All other systems negative  Vitals Juliann Pulse [place]; 03/14/2018 11:55 AM) 03/14/2018 11:46 AM Weight: 215.25 lb Height: 71in Body Surface Area: 2.18 m Body Mass Index: 30.02 kg/m  Pulse: 65 (Regular)  P.OX: 96% (Room air) BP: 149/68 (Sitting, Left Arm, Standard)       Physical Exam [name] Mormon, MD; 03/14/2018 1:8 PM) General Mental Status-Alert. General Appearance-Cooperative and Appears stated age. Build & Nutrition-Moderately built.  Head and Neck Thyroid Gland Characteristics - normal size and consistency and no palpable nodules.  Chest and Lung Exam Chest and lung exam reveals -quiet, even and easy respiratory effort with no use of accessory muscles, non-tender and on auscultation, normal breath sounds, no adventitious sounds.  Cardiovascular Cardiovascular examination reveals -carotid auscultation reveals no bruits, abdominal aorta auscultation reveals no bruits and no prominent pulsation and femoral artery auscultation bilaterally reveals normal pulses, no  bruits, no thrills. Auscultation Rhythm - Irregularly irregular. Murmurs & Other Heart Sounds: Murmur - Location - Aortic Area. Timing - Mid-systolic. Grade - III/VI.  Abdomen Palpation/Percussion Normal exam - Non Tender and No hepatosplenomegaly.  Peripheral Vascular Lower Extremity  Inspection: Note: Blisters. Palpation - Edema - Bilateral - 2+ Pitting edema. Note: bilateral venous insufficiency. Dorsalis pedis pulse - Bilateral - Note: Difficult to appreciate due to edema. Posterior tibial pulse - Bilateral - Note: Difficult to appreciate due to edema. Carotid arteries - Bilateral-No Carotid bruit.  Neurologic Neurologic evaluation reveals -alert and oriented x 3 with no impairment of recent or remote memory. Motor-Grossly intact without any focal  deficits. Note: Stable tremors. Sees Dr. Carles Collet for Parkinspons disease   Musculoskeletal Global Assessment Left Lower Extremity - no deformities, masses or tenderness, no known fractures. Right Lower Extremity - no deformities, masses or tenderness, no known fractures.    Assessment & Plan Joya Gaskins Esther Hardy MD; 03/14/2018 1:08 PM) Pulmonary hypertension (I27.20) Story: Echocardiogram 01/02/2018: Left ventricle cavity is normal in size. Moderate concentric hypertrophy of the left ventricle. Low normal global wall motion. Visual EF is 50-55%. Unable to evaluate diastolic function due to A. Fibrillation. Calculated EF 51%. Left atrial cavity is severely dilated. Right atrial cavity is moderately dilated. Trileaflet aortic valve with moderate aortic valve leaflet calcification. Trace aortic stenosis. Moderate (Grade II) aortic regurgitation. Moderate (Grade II) mitral regurgitation. Moderate tricuspid regurgitation. Mild pulmonary hypertension. Estimated pulmonary artery systolic pressure 65 mmHg. Compared to previous echocardiogram on 09/01/2016, there is interval increase in severity of aortic regurgitation, tricuspid regurgitation, and pulmonary hypertension. Current Plans Six minute walk test [phone]) (Six minute walk test done in office date: 03/14/18; a) at rest O2 saturation-95; b) 83mnute O2 saturation-95; c) 4 minute o2 saturation-96; d) 6 minute O2 saturation-96; e) 6 minute walk distance: Feet-640/ Meters-195.1) Paroxysmal atrial fibrillation (I48.0) Story: EKG 03/14/2018: Sinus rhythm 64 bpm. First degree AV block. Normal axis. IVCD. left atrial enlargement. Poor R wave profression.  EKG 01/11/2018: Atrial fibrillation with controlled ventricular response. Normal axis. Normal conduction. Cannot exclude old anteroseptal infarct. No ischemic changes.  CHA2DS2VASc score 3: Annual stroke risk 3.2% Current Plans Complete electrocardiogram (93000) Changed Amiodarone HCl 200MG, 1  Tablet as directed, Mail Order #90, 90 days starting 03/14/2018, Ref. x3. Local Order: 2 tab twice daily for 7 days, followed by 1 tab twice daily. Changed Lasix 80MG, 1 (one) Tablet as directed, Mail Order #90, 90 days starting 03/14/2018, Ref. x3. Local Order: Two in am, one in pm Bilateral leg edema (R60.0) Venous insufficiency (I87.2) Story: Bilteral varicose veins and edema CEAP class 3/S Compression stockings prescription given 01/27/2017.  Lower extremity venous duplex 04/07/2017: No evidence of deep vein thrombosis of the right lower extremity with normal venous return. No evidence of valvular incompetence (chronic venous insufficiency) of the right lower extremity. No evidence of deep vein thrombosis of the left lower extremity with normal venous return. Chronic valvular incompetence (chronic venous insufficiency) of the left lower extremity. Marked reflux noted in the left sapheno-femoral junction (2.5 s), great saphenous proximal thigh (2.6 s), great saphenous mid thigh (1.6 s), great saphenous distal thigh (3.9 s) and great saphenous proximal calf (3.4 s) veins. Essential hypertension, benign (I10) Coronary artery disease involving native heart without angina pectoris, unspecified vessel or lesion type (I25.10) Story: Coronary angiogram 08/09/2016: FFR guided CSI atherectomy followed by 3.0 x 26 and 3.0 x 18 mm onyx resolute DES to Mid LAD. Mild disease in Cx and RCA. Hypercholesteremia (  E78.00) H/O: GI bleed (Z87.19)  Note:Right heart cath  Assessment/ Recommendations:  83 year old Caucasian male with coronary artery disease status (LAD PCI 2018), paroxysmal atrial fibrillation, complex valvular heart disease, mod AI, mod TR, moderate pulmonary hypertension, venous insufficiency, Parkinsons disease, GI bleed in 08/2017 from internal hemorrhoids, now s/p hemorrhoidectomy.  Paroxysmal atrial fibrilation: CHA2DS2VASc score 3: Annual stroke risk 3.2% Maintaining sinus rhythm  since cardioversion on 03/06/2018. Continue amiodarone 200 mg once daily. off metoprolol sicne starting amiodrone.  Valvular disease: Mod aortic, motral, tricuspid regurg, likely worse due to atrial fibrilation. I am hoping that with maintainign sinus rhythm, he may have some improvement in his chamber sizes and improvemenet in valvular regurgitation.  Pulmonary hypertension: Patient is having lifestyle limiting exertional dyspnea. There is significant increase in his PASP from around 30 mmhg to 65 mmhg on echocardiogram in 12/2017. While this is most likely WHO Grp II PH, it woudl be prudent to obtain right heart cath to see if he has any component of out of proportion WHO Grp II PH, and if he would benefit from [facility] specific therapy. Discussed risks benefits with the patietn.  Hypertension: Fairly well controlled on current therapy.  Venous insufficiency: Down the road, may need referral tro radiology for endovenous ablation. Use stockings till then.  GI bleed: S/p hemorrhoidectomy in 08/2017.Currently, no bleeding with stable Hb. Tolerating Xarelto well.  CAD: Stable. Not on aspirin due to bleeding risk. Continue statin. Repeat lipid panel in 12/2017.  CKD 3: Follow up with PCP and nephrolgy  Elevated ALKP: F/u w/PCP.  Follow up after right heart cath.  Cc Dineen Kid, MD  Signed electronically by [name] Mormon, MD (03/14/2018 1:08 PM)

## 2018-03-20 ENCOUNTER — Encounter (HOSPITAL_COMMUNITY)
Admission: RE | Disposition: A | Discharge status: Home / Self Care | Payer: Self-pay | Source: Home / Self Care | Attending: Cardiology

## 2018-03-20 ENCOUNTER — Ambulatory Visit (HOSPITAL_COMMUNITY)
Admission: RE | Admit: 2018-03-20 | Discharge: 2018-03-20 | Disposition: A | Discharge status: Home / Self Care | Payer: Medicare Other | Attending: Cardiology | Admitting: Cardiology

## 2018-03-20 ENCOUNTER — Other Ambulatory Visit: Payer: Self-pay

## 2018-03-20 DIAGNOSIS — Z79899 Other long term (current) drug therapy: Secondary | ICD-10-CM | POA: Insufficient documentation

## 2018-03-20 DIAGNOSIS — Z823 Family history of stroke: Secondary | ICD-10-CM | POA: Diagnosis not present

## 2018-03-20 DIAGNOSIS — R0609 Other forms of dyspnea: Secondary | ICD-10-CM

## 2018-03-20 DIAGNOSIS — N183 Chronic kidney disease, stage 3 (moderate): Secondary | ICD-10-CM | POA: Diagnosis not present

## 2018-03-20 DIAGNOSIS — I272 Pulmonary hypertension, unspecified: Secondary | ICD-10-CM | POA: Diagnosis not present

## 2018-03-20 DIAGNOSIS — I340 Nonrheumatic mitral (valve) insufficiency: Secondary | ICD-10-CM

## 2018-03-20 DIAGNOSIS — Z7901 Long term (current) use of anticoagulants: Secondary | ICD-10-CM | POA: Diagnosis not present

## 2018-03-20 DIAGNOSIS — I480 Paroxysmal atrial fibrillation: Secondary | ICD-10-CM | POA: Insufficient documentation

## 2018-03-20 DIAGNOSIS — E7800 Pure hypercholesterolemia, unspecified: Secondary | ICD-10-CM | POA: Diagnosis not present

## 2018-03-20 DIAGNOSIS — R0600 Dyspnea, unspecified: Secondary | ICD-10-CM | POA: Diagnosis present

## 2018-03-20 DIAGNOSIS — I2510 Atherosclerotic heart disease of native coronary artery without angina pectoris: Secondary | ICD-10-CM | POA: Diagnosis present

## 2018-03-20 DIAGNOSIS — I071 Rheumatic tricuspid insufficiency: Secondary | ICD-10-CM

## 2018-03-20 DIAGNOSIS — Z8249 Family history of ischemic heart disease and other diseases of the circulatory system: Secondary | ICD-10-CM | POA: Diagnosis not present

## 2018-03-20 DIAGNOSIS — G20 Parkinson's disease: Secondary | ICD-10-CM | POA: Insufficient documentation

## 2018-03-20 DIAGNOSIS — I872 Venous insufficiency (chronic) (peripheral): Secondary | ICD-10-CM | POA: Insufficient documentation

## 2018-03-20 DIAGNOSIS — I129 Hypertensive chronic kidney disease with stage 1 through stage 4 chronic kidney disease, or unspecified chronic kidney disease: Secondary | ICD-10-CM | POA: Insufficient documentation

## 2018-03-20 HISTORY — PX: RIGHT HEART CATH: CATH118263

## 2018-03-20 LAB — POCT I-STAT EG7
Acid-Base Excess: 5 mmol/L — ABNORMAL HIGH (ref 0.0–2.0)
Bicarbonate: 30.5 mmol/L — ABNORMAL HIGH (ref 20.0–28.0)
Calcium, Ion: 1.14 mmol/L — ABNORMAL LOW (ref 1.15–1.40)
HCT: 34 % — ABNORMAL LOW (ref 39.0–52.0)
Hemoglobin: 11.6 g/dL — ABNORMAL LOW (ref 13.0–17.0)
O2 Saturation: 72 %
Potassium: 3.5 mmol/L (ref 3.5–5.1)
Sodium: 140 mmol/L (ref 135–145)
TCO2: 32 mmol/L (ref 22–32)
pCO2, Ven: 46.8 mmHg (ref 44.0–60.0)
pH, Ven: 7.421 (ref 7.250–7.430)
pO2, Ven: 38 mmHg (ref 32.0–45.0)

## 2018-03-20 SURGERY — RIGHT HEART CATH

## 2018-03-20 MED ORDER — ONDANSETRON HCL 4 MG/2ML IJ SOLN: 4.0000 mg | Freq: Four times a day (QID) | INTRAMUSCULAR | Status: DC | PRN

## 2018-03-20 MED ORDER — LIDOCAINE HCL (PF) 1 % IJ SOLN
INTRAMUSCULAR | Status: DC | PRN
  Administered 2018-03-20: 2 mL

## 2018-03-20 MED ORDER — HEPARIN (PORCINE) IN NACL 1000-0.9 UT/500ML-% IV SOLN
INTRAVENOUS | Status: AC
  Filled 2018-03-20: qty 500

## 2018-03-20 MED ORDER — FENTANYL CITRATE (PF) 100 MCG/2ML IJ SOLN
INTRAMUSCULAR | Status: AC
  Filled 2018-03-20: qty 2

## 2018-03-20 MED ORDER — ASPIRIN 81 MG PO CHEW
81.0000 mg | CHEWABLE_TABLET | ORAL | Status: AC
  Administered 2018-03-20: 81 mg via ORAL
  Filled 2018-03-20: qty 1

## 2018-03-20 MED ORDER — MIDAZOLAM HCL 2 MG/2ML IJ SOLN
INTRAMUSCULAR | Status: AC
  Filled 2018-03-20: qty 2

## 2018-03-20 MED ORDER — HEPARIN (PORCINE) IN NACL 1000-0.9 UT/500ML-% IV SOLN
INTRAVENOUS | Status: DC | PRN
  Administered 2018-03-20: 500 mL

## 2018-03-20 MED ORDER — SODIUM CHLORIDE 0.9% FLUSH: 3.0000 mL | INTRAVENOUS | Status: DC | PRN

## 2018-03-20 MED ORDER — SODIUM CHLORIDE 0.9 % IV SOLN
INTRAVENOUS | Status: DC
  Administered 2018-03-20: 13:00:00 via INTRAVENOUS

## 2018-03-20 MED ORDER — SODIUM CHLORIDE 0.9% FLUSH: 3.0000 mL | Freq: Two times a day (BID) | INTRAVENOUS | Status: DC

## 2018-03-20 MED ORDER — MIDAZOLAM HCL 2 MG/2ML IJ SOLN
INTRAMUSCULAR | Status: DC | PRN
  Administered 2018-03-20: 1 mg via INTRAVENOUS

## 2018-03-20 MED ORDER — FUROSEMIDE 40 MG PO TABS: ORAL_TABLET | ORAL | 0 refills | Status: DC

## 2018-03-20 MED ORDER — SODIUM CHLORIDE 0.9 % IV SOLN: 250.0000 mL | INTRAVENOUS | Status: DC | PRN

## 2018-03-20 MED ORDER — AMIODARONE HCL 200 MG PO TABS: ORAL_TABLET | ORAL | 0 refills | Status: DC

## 2018-03-20 MED ORDER — ACETAMINOPHEN 325 MG PO TABS: 650.0000 mg | ORAL_TABLET | ORAL | Status: DC | PRN

## 2018-03-20 MED ORDER — LIDOCAINE HCL (PF) 1 % IJ SOLN
INTRAMUSCULAR | Status: AC
  Filled 2018-03-20: qty 30

## 2018-03-20 MED ORDER — FENTANYL CITRATE (PF) 100 MCG/2ML IJ SOLN
INTRAMUSCULAR | Status: DC | PRN
  Administered 2018-03-20: 25 ug via INTRAVENOUS

## 2018-03-20 SURGICAL SUPPLY — 4 items
CATH BALLN WEDGE 5F 110CM (CATHETERS) ×2 IMPLANT
PACK CARDIAC CATHETERIZATION (CUSTOM PROCEDURE TRAY) ×2 IMPLANT
SHEATH GLIDE SLENDER 4/5FR (SHEATH) ×2 IMPLANT
WIRE EMERALD 3MM-J .025X260CM (WIRE) ×2 IMPLANT

## 2018-03-20 NOTE — Discharge Instructions (Signed)
Right Heart Catheterization, Care After This sheet gives you information about how to care for yourself after your procedure. Your health care provider may also give you more specific instructions. If you have problems or questions, contact your health care provider. What can I expect after the procedure? After the procedure, it is common to have:  Bruising or mild discomfort in the area where the IV was inserted (insertion site). Follow these instructions at home: Eating and drinking   Follow instructions from your health care provider about eating or drinking restrictions. General instructions  Check your IV insertion area every day for signs of infection. Check for: ? Redness, swelling, or pain. ? Fluid or blood. ? Warmth. ? Pus or a bad smell.  Take over-the-counter and prescription medicines only as told by your health care provider.  Rest and return to your normal activities as told by your health care provider. Ask your health care provider what activities are safe for you.  Do not drive for 24 hours if you were given a medicine to help you relax (sedative), or until your health care provider approves.  Keep all follow-up visits as told by your health care provider. This is important. Contact a health care provider if:  Your skin becomes itchy or you develop a rash or hives.  You have a fever that does not get better with medicine.  You feel nauseous.  You vomit.  You have redness, swelling, or pain around the insertion site.  You have fluid or blood coming from the insertion site.  Your insertion area feels warm to the touch.  You have pus or a bad smell coming from the insertion site. Get help right away if:  You have difficulty breathing or shortness of breath.  You develop chest pain.  You faint.  You feel very dizzy. These symptoms may represent a serious problem that is an emergency. Do not wait to see if the symptoms will go away. Get medical help  right away. Call your local emergency services (911 in the [place] Do not drive yourself to the hospital. Summary  After your procedure, it is common to have bruising or mild discomfort in the area where the IV was inserted.  You should check your IV insertion area every day for signs of infection.  Take over-the-counter and prescription medicines only as told by your health care provider.  You should drink a lot of fluids for the first several days after the procedure to help flush the contrast from your body. This information is not intended to replace advice given to you by your health care provider. Make sure you discuss any questions you have with your health care provider. Document Released: 12/05/2012 Document Revised: 01/09/2016 Document Reviewed: 01/09/2016 Elsevier Interactive Patient Education  2019 [employer].

## 2018-03-20 NOTE — Interval H&P Note (Signed)
History and Physical Interval Note:  03/20/2018 2:33 PM  [name]  has presented today for surgery, with the diagnosis of cad  The various methods of treatment have been discussed with the patient and family. After consideration of risks, benefits and other options for treatment, the patient has consented to  Procedure(s): RIGHT HEART CATH (N/A) as a surgical intervention .  The patient's history has been reviewed, patient examined, no change in status, stable for surgery.  I have reviewed the patient's chart and labs.  Questions were answered to the patient's satisfaction.     2012 Appropriate Use Criteria for Diagnostic Catheterization Home / Select Test of Interest Indication for RHC Pulmonary Hypertension Pulmonary Hypertension (Right Heart Catheterization) Pulmonary Hypertension  (Right Heart Catheterization) Link Here: [email or web address] Indication:  1. Suspected pulmonary hypertension 2. Elevated estimated right ventricular systolic pressure on resting echo study A (7) Indication: 98; Score 7    Jayon Matton J Kaylor Simenson

## 2018-03-21 ENCOUNTER — Encounter (HOSPITAL_COMMUNITY): Payer: Self-pay | Admitting: Cardiology

## 2018-03-29 DIAGNOSIS — I272 Pulmonary hypertension, unspecified: Secondary | ICD-10-CM | POA: Diagnosis not present

## 2018-03-29 DIAGNOSIS — R600 Localized edema: Secondary | ICD-10-CM | POA: Diagnosis not present

## 2018-03-29 DIAGNOSIS — I872 Venous insufficiency (chronic) (peripheral): Secondary | ICD-10-CM | POA: Diagnosis not present

## 2018-03-29 DIAGNOSIS — I4821 Permanent atrial fibrillation: Secondary | ICD-10-CM | POA: Diagnosis not present

## 2018-04-09 DIAGNOSIS — N183 Chronic kidney disease, stage 3 (moderate): Secondary | ICD-10-CM | POA: Diagnosis not present

## 2018-04-09 DIAGNOSIS — I129 Hypertensive chronic kidney disease with stage 1 through stage 4 chronic kidney disease, or unspecified chronic kidney disease: Secondary | ICD-10-CM | POA: Diagnosis not present

## 2018-04-09 DIAGNOSIS — I50810 Right heart failure, unspecified: Secondary | ICD-10-CM | POA: Diagnosis not present

## 2018-04-12 ENCOUNTER — Emergency Department (HOSPITAL_COMMUNITY)
Admission: EM | Admit: 2018-04-12 | Discharge: 2018-04-12 | Disposition: A | Discharge status: Home / Self Care | Payer: Medicare Other | Attending: Emergency Medicine | Admitting: Emergency Medicine

## 2018-04-12 ENCOUNTER — Emergency Department (HOSPITAL_COMMUNITY): Payer: Medicare Other

## 2018-04-12 ENCOUNTER — Encounter (HOSPITAL_COMMUNITY): Payer: Self-pay

## 2018-04-12 ENCOUNTER — Other Ambulatory Visit: Payer: Self-pay

## 2018-04-12 DIAGNOSIS — J101 Influenza due to other identified influenza virus with other respiratory manifestations: Secondary | ICD-10-CM | POA: Insufficient documentation

## 2018-04-12 DIAGNOSIS — Z79899 Other long term (current) drug therapy: Secondary | ICD-10-CM | POA: Insufficient documentation

## 2018-04-12 DIAGNOSIS — N183 Chronic kidney disease, stage 3 (moderate): Secondary | ICD-10-CM | POA: Insufficient documentation

## 2018-04-12 DIAGNOSIS — R062 Wheezing: Secondary | ICD-10-CM | POA: Diagnosis not present

## 2018-04-12 DIAGNOSIS — I129 Hypertensive chronic kidney disease with stage 1 through stage 4 chronic kidney disease, or unspecified chronic kidney disease: Secondary | ICD-10-CM | POA: Insufficient documentation

## 2018-04-12 DIAGNOSIS — R0602 Shortness of breath: Secondary | ICD-10-CM | POA: Diagnosis not present

## 2018-04-12 DIAGNOSIS — R05 Cough: Secondary | ICD-10-CM | POA: Diagnosis not present

## 2018-04-12 LAB — CBC WITH DIFFERENTIAL/PLATELET
Abs Immature Granulocytes: 0.01 10*3/uL (ref 0.00–0.07)
Basophils Absolute: 0 10*3/uL (ref 0.0–0.1)
Basophils Relative: 1 %
Eosinophils Absolute: 0 10*3/uL (ref 0.0–0.5)
Eosinophils Relative: 0 %
HCT: 32.9 % — ABNORMAL LOW (ref 39.0–52.0)
Hemoglobin: 9.6 g/dL — ABNORMAL LOW (ref 13.0–17.0)
Immature Granulocytes: 0 %
Lymphocytes Relative: 15 %
Lymphs Abs: 0.6 10*3/uL — ABNORMAL LOW (ref 0.7–4.0)
MCH: 24.4 pg — ABNORMAL LOW (ref 26.0–34.0)
MCHC: 29.2 g/dL — ABNORMAL LOW (ref 30.0–36.0)
MCV: 83.7 fL (ref 80.0–100.0)
Monocytes Absolute: 0.4 10*3/uL (ref 0.1–1.0)
Monocytes Relative: 12 %
Neutro Abs: 2.7 10*3/uL (ref 1.7–7.7)
Neutrophils Relative %: 72 %
Platelets: 165 10*3/uL (ref 150–400)
RBC: 3.93 MIL/uL — ABNORMAL LOW (ref 4.22–5.81)
RDW: 22.5 % — ABNORMAL HIGH (ref 11.5–15.5)
WBC: 3.7 10*3/uL — ABNORMAL LOW (ref 4.0–10.5)
nRBC: 0 % (ref 0.0–0.2)

## 2018-04-12 LAB — COMPREHENSIVE METABOLIC PANEL
ALT: 5 U/L (ref 0–44)
AST: 26 U/L (ref 15–41)
Albumin: 3.5 g/dL (ref 3.5–5.0)
Alkaline Phosphatase: 171 U/L — ABNORMAL HIGH (ref 38–126)
Anion gap: 8 (ref 5–15)
BUN: 27 mg/dL — ABNORMAL HIGH (ref 8–23)
CO2: 35 mmol/L — ABNORMAL HIGH (ref 22–32)
Calcium: 8.7 mg/dL — ABNORMAL LOW (ref 8.9–10.3)
Chloride: 94 mmol/L — ABNORMAL LOW (ref 98–111)
Creatinine, Ser: 2.13 mg/dL — ABNORMAL HIGH (ref 0.61–1.24)
GFR calc Af Amer: 33 mL/min — ABNORMAL LOW (ref >60)
GFR calc non Af Amer: 28 mL/min — ABNORMAL LOW (ref >60)
Glucose, Bld: 94 mg/dL (ref 70–99)
Potassium: 3.3 mmol/L — ABNORMAL LOW (ref 3.5–5.1)
Sodium: 137 mmol/L (ref 135–145)
Total Bilirubin: 2.9 mg/dL — ABNORMAL HIGH (ref 0.3–1.2)
Total Protein: 7.4 g/dL (ref 6.5–8.1)

## 2018-04-12 LAB — URINALYSIS, ROUTINE W REFLEX MICROSCOPIC
Bacteria, UA: NONE SEEN
Bilirubin Urine: NEGATIVE
Glucose, UA: NEGATIVE mg/dL
Hgb urine dipstick: NEGATIVE
Ketones, ur: NEGATIVE mg/dL
Leukocytes,Ua: NEGATIVE
Nitrite: NEGATIVE
Protein, ur: 30 mg/dL — AB
Specific Gravity, Urine: 1.011 (ref 1.005–1.030)
pH: 5 (ref 5.0–8.0)

## 2018-04-12 LAB — INFLUENZA PANEL BY PCR (TYPE A & B)
Influenza A By PCR: POSITIVE — AB
Influenza B By PCR: NEGATIVE

## 2018-04-12 LAB — TROPONIN I: Troponin I: 0.04 ng/mL (ref <0.03)

## 2018-04-12 LAB — BRAIN NATRIURETIC PEPTIDE: B Natriuretic Peptide: 388.7 pg/mL — ABNORMAL HIGH (ref 0.0–100.0)

## 2018-04-12 MED ORDER — METHYLPREDNISOLONE SODIUM SUCC 125 MG IJ SOLR
125.0000 mg | Freq: Once | INTRAMUSCULAR | Status: DC
  Filled 2018-04-12: qty 2

## 2018-04-12 MED ORDER — ALBUTEROL SULFATE HFA 108 (90 BASE) MCG/ACT IN AERS
2.0000 | INHALATION_SPRAY | Freq: Once | RESPIRATORY_TRACT | Status: DC
  Filled 2018-04-12: qty 6.7

## 2018-04-12 MED ORDER — PREDNISONE 20 MG PO TABS
40.0000 mg | ORAL_TABLET | Freq: Once | ORAL | Status: AC
  Administered 2018-04-12: 40 mg via ORAL
  Filled 2018-04-12: qty 2

## 2018-04-12 MED ORDER — ALBUTEROL SULFATE HFA 108 (90 BASE) MCG/ACT IN AERS: 2.0000 | INHALATION_SPRAY | RESPIRATORY_TRACT | 0 refills | Status: DC | PRN

## 2018-04-12 MED ORDER — IPRATROPIUM-ALBUTEROL 0.5-2.5 (3) MG/3ML IN SOLN
3.0000 mL | Freq: Once | RESPIRATORY_TRACT | Status: AC
  Administered 2018-04-12: 3 mL via RESPIRATORY_TRACT
  Filled 2018-04-12: qty 3

## 2018-04-12 MED ORDER — PREDNISONE 20 MG PO TABS: 40.0000 mg | ORAL_TABLET | Freq: Every day | ORAL | 0 refills | Status: DC

## 2018-04-12 MED ORDER — AEROCHAMBER PLUS FLO-VU LARGE MISC: 1.0000 | Freq: Once | Status: DC

## 2018-04-12 MED ORDER — OSELTAMIVIR PHOSPHATE 30 MG PO CAPS: 30.0000 mg | ORAL_CAPSULE | Freq: Every day | ORAL | 0 refills | Status: DC

## 2018-04-12 MED ORDER — OSELTAMIVIR PHOSPHATE 30 MG PO CAPS
30.0000 mg | ORAL_CAPSULE | Freq: Once | ORAL | Status: AC
  Administered 2018-04-12: 30 mg via ORAL
  Filled 2018-04-12: qty 1

## 2018-04-12 MED ORDER — ALBUTEROL SULFATE (2.5 MG/3ML) 0.083% IN NEBU
5.0000 mg | INHALATION_SOLUTION | Freq: Once | RESPIRATORY_TRACT | Status: AC
  Administered 2018-04-12: 5 mg via RESPIRATORY_TRACT
  Filled 2018-04-12: qty 6

## 2018-04-12 NOTE — ED Notes (Signed)
Patient and wife removed all VS equipment and monitor.

## 2018-04-12 NOTE — ED Notes (Signed)
MD notified that patient is ready to leave

## 2018-04-12 NOTE — ED Triage Notes (Signed)
Pt arrive POV w/ wife for eval of worsening SOB since Tuesday w/ wheezing. Wife reports productive cough. Pt audibly wheezing in triage. Pt reports worsening LE edema. Pt denies CP.

## 2018-04-12 NOTE — ED Provider Notes (Signed)
[facility] EMERGENCY DEPARTMENT Provider Note   CSN: [id number] Arrival date & time: 04/12/18  1604     History   Chief Complaint Chief Complaint  Patient presents with  . Shortness of Breath    HPI [name] is a 83 y.o. male.  83yo M w/ extensive PMH including A fib, CAD, HTN, HLD, aortic stenosis, CKD who p/w cough and SOB.  2 days ago he began having cough and wheezing with shortness of breath.  Cough is productive of clear phlegm.  Wife notes that he has been having some wheezing for quite a long time but it seems to be worse since 2 days ago.  He reports feeling bad and yesterday he had a headache which is unusual for him.  He denies any associated chest pain.  They are not aware of any fevers.  No vomiting, abdominal pain, or diarrhea.  No recent travel.  Wife notes that some friends visited 4 days ago who are now both sick. He has chronic stable LE edema.  The history is provided by the patient and the spouse.  Shortness of Breath    Past Medical History:  Diagnosis Date  . Arthritis    "knees; lower back" (08/09/2016)  . Atrial fibrillation ([place])    remote hx/notes 08/07/2016  . Coronary artery disease   . Episodes of trembling    "most of the time it's my left hand" (08/09/2016)  . GI bleeding 08/2017  . High cholesterol   . History of kidney stones   . Hypertension   . Melanoma of shoulder ([place]) 2000s   "left"  . Mitral regurgitation and mitral stenosis    hx/notes 08/07/2016  . Moderate aortic stenosis    hx/notes 08/07/2016  . Parkinson's disease ([place])   . Prostate cancer ([place]) 2011   hx/notes 08/07/2016    Patient Active Problem List   Diagnosis Date Noted  . Pulmonary hypertension ([place]) 03/16/2018  . Mitral regurgitation 03/16/2018  . Tricuspid regurgitation 03/16/2018  . CKD (chronic kidney disease) stage 3, GFR 30-59 ml/min (HCC) 09/26/2017  . Chronic anticoagulation 09/26/2017  . Atrial fibrillation ([place]) 09/26/2017    . Atrial flutter ([place]) 02/11/2017  . Post PTCA 08/09/2016  . Dyspnea on exertion 08/07/2016  . Coronary artery disease without angina pectoris 08/07/2016    Past Surgical History:  Procedure Laterality Date  . CARDIAC CATHETERIZATION  ~ [address], [place]"  . CARDIOVERSION N/A 01/26/2017   Procedure: CARDIOVERSION;  Surgeon: [name], MD;  Location: [facility];  Service: Cardiovascular;  Laterality: N/A;  . CARDIOVERSION N/A 03/06/2018   Procedure: CARDIOVERSION;  Surgeon: [name], MD;  Location: [facility];  Service: Cardiovascular;  Laterality: N/A;  . CATARACT EXTRACTION W/ INTRAOCULAR LENS  IMPLANT, BILATERAL Bilateral 2014-02/2016   left-right; hx/notes 08/07/2016  . COLONOSCOPY WITH PROPOFOL N/A 09/25/2017   Procedure: COLONOSCOPY WITH PROPOFOL;  Surgeon: Laurence Spates, MD;  Location: [facility];  Service: Endoscopy;  Laterality: N/A;  . CORONARY ANGIOPLASTY WITH STENT PLACEMENT  08/09/2016  . CORONARY ATHERECTOMY N/A 08/09/2016   Procedure: Coronary Atherectomy;  Surgeon: Adrian Prows, MD;  Location: [facility];  Service: Cardiovascular;  Laterality: N/A;  . CORONARY STENT INTERVENTION N/A 08/09/2016   Procedure: Coronary Stent Intervention;  Surgeon: Adrian Prows, MD;  Location: [facility];  Service: Cardiovascular;  Laterality: N/A;  LAD  . EYE SURGERY Bilateral 05/2016   "laser on both lenses"  . HEMORRHOID SURGERY N/A 09/27/2017  Procedure: HEMORRHOIDECTOMY;  Surgeon: Erroll Luna, MD;  Location: [facility];  Service: General;  Laterality: N/A;  . INGUINAL HERNIA REPAIR Left 1991  . INSERTION PROSTATE RADIATION SEED  06/25/2009  . INTRAVASCULAR PRESSURE WIRE/FFR STUDY N/A 08/09/2016   Procedure: Intravascular Pressure Wire/FFR Study;  Surgeon: Adrian Prows, MD;  Location: [facility];  Service: Cardiovascular;  Laterality: N/A;  . LAPAROSCOPIC CHOLECYSTECTOMY  1993  . MELANOMA EXCISION Left 1990s   "shoulder"  . RIGHT HEART CATH  N/A 03/20/2018   Procedure: RIGHT HEART CATH;  Surgeon: [name], MD;  Location: [facility];  Service: Cardiovascular;  Laterality: N/A;  . RIGHT/LEFT HEART CATH AND CORONARY ANGIOGRAPHY N/A 08/09/2016   Procedure: Right/Left Heart Cath and Coronary Angiography;  Surgeon: Adrian Prows, MD;  Location: [facility];  Service: Cardiovascular;  Laterality: N/A;  . TEE WITHOUT CARDIOVERSION N/A 01/25/2017   Procedure: TRANSESOPHAGEAL ECHOCARDIOGRAM (TEE);  Surgeon: [name], MD;  Location: [facility] ENDOSCOPY;  Service: Cardiovascular;  Laterality: N/A;        Home Medications    Prior to Admission medications   Medication Sig Start Date End Date Taking? Authorizing Provider  amLODipine (NORVASC) 5 MG tablet Take 5 mg by mouth daily. (0800)   Yes [provider]  apixaban (ELIQUIS) 2.5 MG TABS tablet Take 2.5 mg by mouth 2 (two) times daily. (0800 & 2000)   Yes [provider]  carbidopa-levodopa (SINEMET IR) 25-100 MG tablet 2 at 7am/2 at 11am/1 at 4pm Patient taking differently: Take 1-2 tablets by mouth See admin instructions. Take 2 tablets by mouth at 0700,  take 2 tablets by mouth at 11 am,  & take 1 tablet by mouth at 4pm 01/19/18  Yes Tat, Eustace Quail, DO  Cholecalciferol (VITAMIN D-3) 1000 units CAPS Take 1,000 Units by mouth daily. (0800)   Yes [provider]  furosemide (LASIX) 40 MG tablet 2 in am, 1 in afternoon Patient taking differently: Take 40-80 mg by mouth See admin instructions. Take 2 tablets by mouth in the morning and 1 tablet in the afternoon 03/20/18  Yes Patwardhan, Manish J, MD  gabapentin (NEURONTIN) 100 MG capsule Take 100 mg by mouth daily. 01/24/18  Yes [provider]  gabapentin (NEURONTIN) 300 MG capsule Take 300 mg by mouth at bedtime. 22:30   Yes [provider]  Multiple Vitamins-Minerals (PRESERVISION AREDS 2 PO) Take 1 tablet by mouth 2 (two) times daily. (0800 & 2000)   Yes [provider]  rosuvastatin (CRESTOR) 10 MG tablet Take 10 mg by mouth daily at 8 pm. (2000)   Yes [provider]  albuterol (PROVENTIL HFA;VENTOLIN HFA) 108 (90 Base) MCG/ACT inhaler Inhale 2 puffs into the lungs every 4 (four) hours as needed for wheezing or shortness of breath. 04/12/18   Little, Wenda Overland, MD  amiodarone (PACERONE) 200 MG tablet 2 tablets twice daily till 03/29/2018 Patient not taking: Reported on 04/12/2018 03/20/18   [name], MD  oseltamivir (TAMIFLU) 30 MG capsule Take 1 capsule (30 mg total) by mouth daily. 04/12/18   Little, Wenda Overland, MD  polyethylene glycol [facility] / GLYCOLAX) packet Take 17 g by mouth 2 (two) times daily. Patient not taking: Reported on 04/12/2018 09/29/17   Hosie Poisson, MD  predniSONE (DELTASONE) 20 MG tablet Take 2 tablets (40 mg total) by mouth daily. 04/12/18   Little, Wenda Overland, MD    Family History Family History  Problem Relation Age of Onset  .  Heart attack Father   . Cerebral aneurysm Sister     Social History Social History   Tobacco Use  . Smoking status: Never Smoker  . Smokeless tobacco: Never Used  . Tobacco comment: "smoked  2 months when I was 20; non since" (08/09/2016)  Substance Use Topics  . Alcohol use: Yes    Alcohol/week: 14.0 standard drinks    Types: 14 Shots of liquor per week    Comment: 2 drinks per day (canadian mist)  . Drug use: No     Allergies   Nsaids   Review of Systems Review of Systems  Respiratory: Positive for shortness of breath.    All other systems reviewed and are negative except that which was mentioned in HPI   Physical Exam Updated Vital Signs BP 132/67 (BP Location: Right Arm)   Pulse (!) 122   Temp 98.4 F (36.9 C) (Oral)   Resp 16   Ht 5\' 11"  (1.803 m)   Wt 97.5 kg   SpO2 97%   BMI 29.99 kg/m   Physical Exam Vitals signs and nursing note reviewed.  Constitutional:      General: He is not in acute distress.    Appearance: He is  well-developed.  HENT:     Head: Normocephalic and atraumatic.  Eyes:     Conjunctiva/sclera: Conjunctivae normal.     Pupils: Pupils are equal, round, and reactive to light.  Neck:     Musculoskeletal: Neck supple.  Cardiovascular:     Rate and Rhythm: Normal rate and regular rhythm.     Heart sounds: Murmur present.  Pulmonary:     Comments: very slightly increased work of breathing with audible upper airway wheezing, diminished breath sounds b/l Abdominal:     General: Bowel sounds are normal. There is no distension.     Palpations: Abdomen is soft.     Tenderness: There is no abdominal tenderness.  Musculoskeletal:     Right lower leg: Edema present.     Left lower leg: Edema present.     Comments: 2+ pitting edema BLE  Skin:    General: Skin is warm and dry.  Neurological:     Mental Status: He is alert and oriented to person, place, and time.     Comments: Fluent speech  Psychiatric:        Mood and Affect: Mood normal.        Judgment: Judgment normal.      ED Treatments / Results  Labs (all labs ordered are listed, but only abnormal results are displayed) Labs Reviewed  COMPREHENSIVE METABOLIC PANEL - Abnormal; Notable for the following components:      Result Value   Potassium 3.3 (*)    Chloride 94 (*)    CO2 35 (*)    BUN 27 (*)    Creatinine, Ser 2.13 (*)    Calcium 8.7 (*)    Alkaline Phosphatase 171 (*)    Total Bilirubin 2.9 (*)    GFR calc non Af Amer 28 (*)    GFR calc Af Amer 33 (*)    All other components within normal limits  CBC WITH DIFFERENTIAL/PLATELET - Abnormal; Notable for the following components:   WBC 3.7 (*)    RBC 3.93 (*)    Hemoglobin 9.6 (*)    HCT 32.9 (*)    MCH 24.4 (*)    MCHC 29.2 (*)    RDW 22.5 (*)    Lymphs Abs 0.6 (*)  All other components within normal limits  URINALYSIS, ROUTINE W REFLEX MICROSCOPIC - Abnormal; Notable for the following components:   Protein, ur 30 (*)    All other components within normal  limits  BRAIN NATRIURETIC PEPTIDE - Abnormal; Notable for the following components:   B Natriuretic Peptide 388.7 (*)    All other components within normal limits  TROPONIN I - Abnormal; Notable for the following components:   Troponin I 0.04 (*)    All other components within normal limits  INFLUENZA PANEL BY PCR (TYPE A & B) - Abnormal; Notable for the following components:   Influenza A By PCR POSITIVE (*)    All other components within normal limits    EKG EKG Interpretation  Date/Time:  Thursday April 12 2018 16:15:28 EST Ventricular Rate:  79 PR Interval:    QRS Duration: 94 QT Interval:  516 QTC Calculation: 591 R Axis:   -24 Text Interpretation:  ** Critical Test Result: Long QTc Atrial fibrillation with a competing junctional pacemaker Incomplete right bundle branch block Minimal voltage criteria for LVH, may be normal variant Nonspecific ST and T wave abnormality Prolonged QT Abnormal ECG long QT new from previous Confirmed by Theotis Burrow [phone]) on 04/12/2018 5:58:46 PM   Radiology Dg Chest 2 View  Result Date: 04/12/2018 CLINICAL DATA:  Shortness of breath, cough. EXAM: CHEST - 2 VIEW COMPARISON:  Radiograph February 11, 2017. FINDINGS: Stable cardiomediastinal silhouette. Atherosclerosis of thoracic aorta is noted. No pneumothorax or pleural effusion is noted. Left lung is clear. Stable elevated right hemidiaphragm is noted. Mild right basilar subsegmental atelectasis is noted. Bony thorax is unremarkable. IMPRESSION: Stable elevated right hemidiaphragm. Mild right basilar subsegmental atelectasis. Aortic Atherosclerosis (ICD10-I70.0). Electronically Signed   By: Marijo Conception, M.D.   On: 04/12/2018 18:34    Procedures Procedures (including critical care time)  Medications Ordered in ED Medications  albuterol (PROVENTIL) (2.5 MG/3ML) 0.083% nebulizer solution 5 mg (5 mg Nebulization Given 04/12/18 1700)  oseltamivir (TAMIFLU) capsule 30 mg (30 mg Oral Given  04/12/18 2011)  predniSONE (DELTASONE) tablet 40 mg (40 mg Oral Given 04/12/18 2010)  ipratropium-albuterol (DUONEB) 0.5-2.5 (3) MG/3ML nebulizer solution 3 mL (3 mLs Nebulization Given 04/12/18 2011)     Initial Impression / Assessment and Plan / ED Course  I have reviewed the triage vital signs and the nursing notes.  Pertinent labs & imaging results that were available during my care of the patient were reviewed by me and considered in my medical decision making (see chart for details).    Non-toxic on exam, mild increased WOB with upper airway noises but little lower airway wheezing. Improved with duoneb. Labs show BNP 388, trop 0.04, influenza A positive. Cr 2.13 which is slightly higher than previous.  He was difficult IV access and preferred oral medication, therefore gave prednisone along with Tamiflu.  Encouraged aggressive hydration.  Chest x-ray without infiltrate.  I suspect most of his respiratory symptoms are due to influenza.  I feel Tamiflu course is reasonable given 48h time of onset and advanced age.  He voiced understanding of risks and benefits.  He is improved after albuterol and have provided albuterol to use at home. They feel comfortable with discharge, will follow up with PCP in a few days for recheck and repeat creatinine, and understand return precautions.  Final Clinical Impressions(s) / ED Diagnoses   Final diagnoses:  Influenza A  Wheezing    ED Discharge Orders  Ordered    albuterol (PROVENTIL HFA;VENTOLIN HFA) 108 (90 Base) MCG/ACT inhaler  Every 4 hours PRN     04/12/18 2127    oseltamivir (TAMIFLU) 30 MG capsule  Daily     04/12/18 2127    predniSONE (DELTASONE) 20 MG tablet  Daily     04/12/18 2127           Little, Wenda Overland, MD 04/13/18 [phone]

## 2018-04-12 NOTE — ED Notes (Signed)
Patient reminded of need for urine sample, urinal bedside.

## 2018-04-20 ENCOUNTER — Telehealth: Payer: Self-pay | Admitting: Neurology

## 2018-04-20 DIAGNOSIS — I2510 Atherosclerotic heart disease of native coronary artery without angina pectoris: Secondary | ICD-10-CM | POA: Diagnosis not present

## 2018-04-20 DIAGNOSIS — R778 Other specified abnormalities of plasma proteins: Secondary | ICD-10-CM | POA: Diagnosis not present

## 2018-04-20 DIAGNOSIS — R600 Localized edema: Secondary | ICD-10-CM | POA: Diagnosis not present

## 2018-04-20 DIAGNOSIS — E876 Hypokalemia: Secondary | ICD-10-CM | POA: Diagnosis not present

## 2018-04-20 DIAGNOSIS — Z8546 Personal history of malignant neoplasm of prostate: Secondary | ICD-10-CM | POA: Diagnosis not present

## 2018-04-20 DIAGNOSIS — G629 Polyneuropathy, unspecified: Secondary | ICD-10-CM | POA: Diagnosis not present

## 2018-04-20 DIAGNOSIS — R05 Cough: Secondary | ICD-10-CM | POA: Diagnosis not present

## 2018-04-20 DIAGNOSIS — J101 Influenza due to other identified influenza virus with other respiratory manifestations: Secondary | ICD-10-CM | POA: Diagnosis not present

## 2018-04-20 DIAGNOSIS — N189 Chronic kidney disease, unspecified: Secondary | ICD-10-CM | POA: Diagnosis not present

## 2018-04-20 DIAGNOSIS — I4891 Unspecified atrial fibrillation: Secondary | ICD-10-CM | POA: Diagnosis not present

## 2018-04-20 DIAGNOSIS — I272 Pulmonary hypertension, unspecified: Secondary | ICD-10-CM | POA: Diagnosis not present

## 2018-04-20 DIAGNOSIS — G20 Parkinson's disease: Secondary | ICD-10-CM | POA: Diagnosis not present

## 2018-04-20 NOTE — Telephone Encounter (Signed)
Vaughan Basta called and left a VM and wanted to ask about patient medication dosage for the gabapentin 300mg 

## 2018-04-20 NOTE — Telephone Encounter (Signed)
Spoke with patient's significant other (on DPR) she states she couldn't remember what dose he was taking - his new PCP wanted to make sure Gabapentin 300 mg daily was okay with Dr. Carles Collet. Per last note patient was on Gabapentin 300 mg daily. This was documented as okay with Dr. Carles Collet, but stated that he needed to be monitored for renal insufficiency. She will made PCP aware.

## 2018-05-08 NOTE — Progress Notes (Signed)
Patient is here for follow up visit.  Subjective:   '@Patient'  ID: [name], male    DOB: February 23, 1936, 83 y.o.   MRN: [id number]  Chief Complaint  Patient presents with  . Atrial Fibrillation  . Pulmonary HTN  . Follow-up    6wk     HPI  83 year old Caucasian male with coronary artery disease status (LAD PCI 2018), now permanentl atrial fibrillation, complex valvular heart disease, mod AI, mod TR, moderate pulmonary hypertension, venous insufficiency, Parkinsons disease, GI bleed in 08/2017 from internal hemorrhoids, now s/p hemorrhoidectomy.  At last visit, we have mutually decided to treat his atrial fibrillation as permanent given that he was not able to maintain sinus rhythm after cardioversion in spite of being on amiodarone. We had thus stopped amiodarone.  I recommended that he resumes metoprolol succinate at half tablet of 50 mg daily,  if HR consistently >80, SBP consistently >140 mmHg. Right heart catheterization had suggested that his pulmonary hypertension was primarily WHO group 2 due to elevated LVEDP and wedge pressure.  I had this recommended diuresis with Lasix 80 mg in the morning, and 40 mg in the afternoon.  Patient is here for 6 week follow up. Since his last visit with me, it appears that patient was in the emergency department on 04/12/2018 with complaints of shortness of breath.  Work-up in the ED was remarkable for increased work of breathing, BNP 388, troponin 0 0.04, influenza A positive.  His creatinine was also noted to be elevated at 2.1 compared to previous reading of 1.6.  He was started on Tamiflu for treatment of influenza.  It appears that patient left from the ED, contrary to recommendations he was also given albuterol treatment, with which his breathing improved.  He is here with his companion Ms. Linda. His biggest complaint is bilateral knee pain and overall fatigue. He still exercises almost everyday- including bicycling, knee bends,  kettle bell etc for 45-60 min. His breathing and leg edema are fairly stable.  He had one episode of mechanical fall yesterday while carrying 7 bags of grocery all at once. He has not had ay presyncope or syncope. Dizziness has resolved.   Past Medical History:  Diagnosis Date  . Arthritis    "knees; lower back" (08/09/2016)  . Atrial fibrillation ([place])    remote hx/notes 08/07/2016  . Coronary artery disease   . Episodes of trembling    "most of the time it's my left hand" (08/09/2016)  . GI bleeding 08/2017  . High cholesterol   . History of kidney stones   . Hypertension   . Melanoma of shoulder ([place]) 2000s   "left"  . Mitral regurgitation and mitral stenosis    hx/notes 08/07/2016  . Moderate aortic stenosis    hx/notes 08/07/2016  . Parkinson's disease ([place])   . Prostate cancer ([place]) 2011   hx/notes 08/07/2016     Past Surgical History:  Procedure Laterality Date  . CARDIAC CATHETERIZATION  ~ [address] [place]"  . CARDIOVERSION N/A 01/26/2017   Procedure: CARDIOVERSION;  Surgeon: [name], MD;  Location: [facility];  Service: Cardiovascular;  Laterality: N/A;  . CARDIOVERSION N/A 03/06/2018   Procedure: CARDIOVERSION;  Surgeon: [name], MD;  Location: [facility];  Service: Cardiovascular;  Laterality: N/A;  . CATARACT EXTRACTION W/ INTRAOCULAR LENS  IMPLANT, BILATERAL Bilateral 2014-02/2016   left-right; hx/notes 08/07/2016  . COLONOSCOPY WITH PROPOFOL N/A 09/25/2017   Procedure: COLONOSCOPY WITH PROPOFOL;  Surgeon: Oletta Lamas,  Jeneen Rinks, MD;  Location: [facility];  Service: Endoscopy;  Laterality: N/A;  . CORONARY ANGIOPLASTY WITH STENT PLACEMENT  08/09/2016  . CORONARY ATHERECTOMY N/A 08/09/2016   Procedure: Coronary Atherectomy;  Surgeon: Adrian Prows, MD;  Location: [facility];  Service: Cardiovascular;  Laterality: N/A;  . CORONARY STENT INTERVENTION N/A 08/09/2016   Procedure: Coronary Stent Intervention;  Surgeon: Adrian Prows, MD;  Location:  [facility];  Service: Cardiovascular;  Laterality: N/A;  LAD  . EYE SURGERY Bilateral 05/2016   "laser on both lenses"  . HEMORRHOID SURGERY N/A 09/27/2017   Procedure: HEMORRHOIDECTOMY;  Surgeon: Erroll Luna, MD;  Location: [facility];  Service: General;  Laterality: N/A;  . INGUINAL HERNIA REPAIR Left 1991  . INSERTION PROSTATE RADIATION SEED  06/25/2009  . INTRAVASCULAR PRESSURE WIRE/FFR STUDY N/A 08/09/2016   Procedure: Intravascular Pressure Wire/FFR Study;  Surgeon: Adrian Prows, MD;  Location: [facility];  Service: Cardiovascular;  Laterality: N/A;  . LAPAROSCOPIC CHOLECYSTECTOMY  1993  . MELANOMA EXCISION Left 1990s   "shoulder"  . RIGHT HEART CATH N/A 03/20/2018   Procedure: RIGHT HEART CATH;  Surgeon: [name], MD;  Location: [facility];  Service: Cardiovascular;  Laterality: N/A;  . RIGHT/LEFT HEART CATH AND CORONARY ANGIOGRAPHY N/A 08/09/2016   Procedure: Right/Left Heart Cath and Coronary Angiography;  Surgeon: Adrian Prows, MD;  Location: [facility];  Service: Cardiovascular;  Laterality: N/A;  . TEE WITHOUT CARDIOVERSION N/A 01/25/2017   Procedure: TRANSESOPHAGEAL ECHOCARDIOGRAM (TEE);  Surgeon: [name], MD;  Location: [facility];  Service: Cardiovascular;  Laterality: N/A;     Social History   Socioeconomic History  . Marital status: Soil scientist    Spouse name: Not on file  . Number of children: 11  . Years of education: Not on file  . Highest education level: Not on file  Occupational History  . 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 Financial resource strain: Not on file  . Food insecurity:    Worry: Not on file    Inability: Not on file  . Transportation needs:    Medical: Not on file    Non-medical: Not on file  Tobacco Use  . Smoking status: Never Smoker  . Smokeless tobacco: Never Used  . Tobacco comment: "smoked  2 months when I was 20; non since" (08/09/2016)    Substance and Sexual Activity  . Alcohol use: Yes    Alcohol/week: 14.0 standard drinks    Types: 14 Shots of liquor per week    Comment: 1drinks per day (can[place] mist)  . Drug use: No  . Sexual activity: Not Currently  Lifestyle  . Physical activity:    Days per week: Not on file    Minutes per session: Not on file  . Stress: Not on file  Relationships  . Social connections:    Talks on phone: Not on file    Gets together: Not on file    Attends religious service: Not on file    Active member of club or organization: Not on file    Attends meetings of clubs or organizations: Not on file    Relationship status: Not on file  . Intimate partner violence:    Fear of current or ex partner: Not on file    Emotionally abused: Not on file    Physically abused: Not on file    Forced sexual activity: Not on  file  Other Topics Concern  . Not on file  Social History Narrative   Retired Hotel manager with [employer]     Current Outpatient Medications on File Prior to Visit  Medication Sig Dispense Refill  . albuterol (PROVENTIL HFA;VENTOLIN HFA) 108 (90 Base) MCG/ACT inhaler Inhale 2 puffs into the lungs every 4 (four) hours as needed for wheezing or shortness of breath. 1 Inhaler 0  . amLODipine (NORVASC) 5 MG tablet Take 5 mg by mouth daily. (0800)    . apixaban (ELIQUIS) 2.5 MG TABS tablet Take 2.5 mg by mouth 2 (two) times daily. (0800 & 2000)    . carbidopa-levodopa (SINEMET IR) 25-100 MG tablet 2 at 7am/2 at 11am/1 at 4pm (Patient taking differently: Take 1-2 tablets by mouth See admin instructions. Take 2 tablets by mouth at 0700,  take 2 tablets by mouth at 11 am,  & take 1 tablet by mouth at 4pm) 540 tablet 1  . Cholecalciferol (VITAMIN D-3) 1000 units CAPS Take 1,000 Units by mouth daily. (0800)    . furosemide (LASIX) 40 MG tablet 2 in am, 1 in afternoon (Patient taking differently: Take 40-80 mg by mouth See admin instructions. Take 2 tablets by mouth in the morning and 1 tablet in  the afternoon) 60 tablet 0  . gabapentin (NEURONTIN) 300 MG capsule Take 300 mg by mouth 3 (three) times daily. 22:30     . Multiple Vitamins-Minerals (PRESERVISION AREDS 2 PO) Take 1 tablet by mouth 2 (two) times daily. (0800 & 2000)    . nitroGLYCERIN (NITROSTAT) 0.4 MG SL tablet Place 0.4 mg under the tongue every 5 (five) minutes as needed for chest pain.    . polyethylene glycol (MIRALAX / GLYCOLAX) packet Take 17 g by mouth 2 (two) times daily. 14 each 0  . rosuvastatin (CRESTOR) 10 MG tablet Take 10 mg by mouth daily at 8 pm. (2000)     No current facility-administered medications on file prior to visit.     Cardiovascular studies:  EKG 03/14/2018: Sinus rhythm 64 bpm. First degree AV block. Normal axis. IVCD. left atrial enlargement. Poor R wave profression.   [place] 03/20/2018: WHO Grp II pulmonary hypertension Mean PA 42 mmHg, PVR 2.4 WU Normal RV systolic function (PAPi 2.4) Recommendation: Continue medical management with aggressive diuresis.  Echocardiogram 01/02/2018: Left ventricle cavity is normal in size. Moderate concentric hypertrophy of the left ventricle. Low normal global wall motion. Visual EF is 50-55%. Unable to evaluate diastolic function due to A. Fibrillation. Calculated EF 51%. Left atrial cavity is severely dilated. Right atrial cavity is moderately dilated. Trileaflet aortic valve with moderate aortic valve leaflet calcification. Trace aortic stenosis. Moderate (Grade II) aortic regurgitation. Moderate (Grade II) mitral regurgitation. Moderate tricuspid regurgitation. Mild pulmonary hypertension. Estimated pulmonary artery systolic pressure 65 mmHg. Compared to previous echocardiogram on 09/01/2016, there is interval increase in severity of aortic regurgitation, tricuspid regurgitation, and pulmonary hypertension.  Echocardiogram 01/02/2018: Left ventricle cavity is normal in size. Moderate concentric hypertrophy of the left ventricle. Low normal global wall  motion. Visual EF is 50-55%. Unable to evaluate diastolic function due to A. Fibrillation. Calculated EF 51%. Left atrial cavity is severely dilated. Right atrial cavity is moderately dilated. Trileaflet aortic valve with moderate aortic valve leaflet calcification. Trace aortic stenosis. Moderate (Grade II) aortic regurgitation. Moderate (Grade II) mitral regurgitation. Moderate tricuspid regurgitation. Mild pulmonary hypertension. Estimated pulmonary artery systolic pressure 65 mmHg. Compared to previous echocardiogram on 09/01/2016, there is interval increase in severity of aortic regurgitation,  tricuspid regurgitation, and pulmonary hypertension.  Event monitor 04/27/2017 - 05/26/2017: Persistent atrial fibrillation with controlled ventricular rate.  Two episodes of 3 beat nonsustained VT 4.6 second pause at 11:34 PM on 05/03/2017.  3.9 second pause at 7 AM on 05/14/2017. Symptoms not reported. Occasional episodes of atypical atrial flutter with variable conduction  Lower extremity venous duplex 04/07/2017: No evidence of deep vein thrombosis of the right lower extremity with normal venous return. No evidence of valvular incompetence (chronic venous insufficiency) of the right lower extremity.  No evidence of deep vein thrombosis of the left lower extremity with normal venous return. Chronic valvular incompetence (chronic venous insufficiency) of the left lower extremity. Marked reflux noted in the left sapheno-femoral junction (2.5 s), great saphenous proximal thigh (2.6 s), great saphenous mid thigh (1.6 s), great saphenous distal thigh (3.9 s) and great saphenous proximal calf (3.4 s) veins.  Cardioversion 01/26/2017: S/p successful TEE guided cardioversion 100 JS/p successful TEE guided cardioversion 100 J  Coronary angiogram 08/09/2016: FFR guided CSI atherectomy followed by 3.0 x 26 and 3.0 x 18 mm onyx resolute DES to Mid LAD. Mild disease in Cx and RCA.  Lower Extremity Dopplers  06/2009: Pt has Baker Cyst behind both knees.   Recent labs:   CMP Latest Ref Rng & Units 04/12/2018  Glucose 70 - 99 mg/dL 94  BUN 8 - 23 mg/dL 27(H)  Creatinine 0.61 - 1.24 mg/dL 2.13(H)  Sodium 135 - 145 mmol/L 137  Potassium 3.5 - 5.1 mmol/L 3.3(L)  Chloride 98 - 111 mmol/L 94(L)  CO2 22 - 32 mmol/L 35(H)  Calcium 8.9 - 10.3 mg/dL 8.7(L)  Total Protein 6.5 - 8.1 g/dL 7.4  Total Bilirubin 0.3 - 1.2 mg/dL 2.9(H)  Alkaline Phos 38 - 126 U/L 171(H)  AST 15 - 41 U/L 26  ALT 0 - 44 U/L 5  eGFR non Af Amer >60 mL/min 28(L)   CBC Latest Ref Rng & Units 04/12/2018  WBC 4.0 - 10.5 K/uL 3.7(L)  Hemoglobin 13.0 - 17.0 g/dL 9.6(L)  Hematocrit 39.0 - 52.0 % 32.9(L)  Platelets 150 - 400 K/uL 165    01/02/2018: Lipids: Cholesterol 106, triglycerides 48, HDL 46, LDL 50.    Review of Systems  Constitution: Negative for decreased appetite, malaise/fatigue, weight gain and weight loss.  HENT: Negative for congestion.   Eyes: Negative for visual disturbance.  Cardiovascular: Positive for dyspnea on exertion (Significantly improved) and leg swelling (Significantly improved). Negative for chest pain, palpitations and syncope.  Respiratory: Negative for shortness of breath.   Endocrine: Negative for cold intolerance.  Hematologic/Lymphatic: Does not bruise/bleed easily.  Skin: Negative for itching and rash.  Musculoskeletal: Positive for joint pain (Bilateral knees). Negative for myalgias.  Gastrointestinal: Negative for abdominal pain, nausea and vomiting.  Genitourinary: Negative for dysuria.  Neurological: Negative for dizziness and weakness.  Psychiatric/Behavioral: The patient is not nervous/anxious.   All other systems reviewed and are negative.      Objective:    Vitals:   05/10/18 1347  BP: (!) 145/71  Pulse: 61  SpO2: 99%     Physical Exam  Constitutional: He is oriented to person, place, and time. He appears well-developed and well-nourished. No distress.  HENT:    Head: Normocephalic and atraumatic.  Eyes: Pupils are equal, round, and reactive to light. Conjunctivae are normal.  Neck: Neck supple. No JVD present. No thyromegaly present.  Cardiovascular: Normal rate, intact distal pulses and normal pulses. An irregularly irregular rhythm present. Exam reveals no gallop,  no S3 and no S4.  Murmur heard.  Midsystolic murmur is present with a grade of 3/6. Aortic Area S1 is variable, S2 is normal.  Pulmonary/Chest: Effort normal and breath sounds normal. He has no wheezes. He has no rales.  Abdominal: Soft. Bowel sounds are normal. There is no rebound.  Musculoskeletal: Normal range of motion.        General: No edema.  Lymphadenopathy:    He has no cervical adenopathy.  Neurological: He is alert and oriented to person, place, and time. No cranial nerve deficit.  Skin: Skin is warm and dry.  Psychiatric: He has a normal mood and affect.  Nursing note and vitals reviewed.       Assessment & Recommendations:   83 year old Caucasian male with coronary artery disease status (LAD PCI 2018), now permanentl atrial fibrillation, complex valvular heart disease, mod AI, mod TR, moderate pulmonary hypertension, venous insufficiency, Parkinsons disease, hemorrhoidal GI bleed in 08/2017  Permanent atrial fibrilation: We have mutually decided to treat this as permanent Afib given inability to maintain sinus rhythm after multiple cardioversions with amiodarone use. Heart rate is controlled without metoprolol use.  CHA2DS2VASc score 3: Annual stroke risk 3.2%  Valvular disease: Mod aortic, motral, tricuspid regurg, likely all due to atrial fibrilation. Rate control for Afib, as above.  Pulmonary hypertension: WHO Grp II pulmonary hypertension due to left sided heart diseease (HFpEF, valvular heart disease, Afib- [facility] 02/2018 showed PCWP 27 mmHg). Recommend continued management of hypertension and diuresis. Lasix 80 mg in am, 40 mg in pom. Given his tenuous  renal function, I have not made any change to his lasix dose. Will obtain labs from his nephrologist.   Hypertension: Fairly well controlled on current therapy.  Venous insufficiency: Prefers conservative management. Continue to wear compression stockings.  H/o GI bleed: S/p hemorrhoidectomy in 08/2017.Currently, no bleeding with stable Hb. Tolerating Eliquis well.  CAD: Stable. Not on aspirin due to bleeding risk. Continue statin. Given his advanced age, renal dysfunction, recommend conservative medical management  Today, we briefly discussed patient's advanced directive and end of life wishes. Patient reportedly has this paperwork at home. I have encouraged him to bring this to his next visit, for our reference.   I will see him back in 3 months   [place], MD [facility] [place] Pager: [phone] Office: [phone] If no answer Cell [phone]

## 2018-05-10 ENCOUNTER — Encounter: Payer: Self-pay | Admitting: Cardiology

## 2018-05-10 ENCOUNTER — Other Ambulatory Visit: Payer: Self-pay

## 2018-05-10 ENCOUNTER — Ambulatory Visit (INDEPENDENT_AMBULATORY_CARE_PROVIDER_SITE_OTHER): Payer: Medicare Other | Admitting: Cardiology

## 2018-05-10 VITALS — BP 145/71 | HR 61 | Ht 71.0 in | Wt 217.5 lb

## 2018-05-10 DIAGNOSIS — I340 Nonrheumatic mitral (valve) insufficiency: Secondary | ICD-10-CM | POA: Diagnosis not present

## 2018-05-10 DIAGNOSIS — I2510 Atherosclerotic heart disease of native coronary artery without angina pectoris: Secondary | ICD-10-CM | POA: Diagnosis not present

## 2018-05-10 DIAGNOSIS — I4821 Permanent atrial fibrillation: Secondary | ICD-10-CM | POA: Diagnosis not present

## 2018-05-10 DIAGNOSIS — I272 Pulmonary hypertension, unspecified: Secondary | ICD-10-CM

## 2018-05-10 DIAGNOSIS — I361 Nonrheumatic tricuspid (valve) insufficiency: Secondary | ICD-10-CM | POA: Diagnosis not present

## 2018-05-14 ENCOUNTER — Encounter: Payer: Self-pay | Admitting: Cardiology

## 2018-05-14 DIAGNOSIS — Z0189 Encounter for other specified special examinations: Secondary | ICD-10-CM | POA: Insufficient documentation

## 2018-06-06 ENCOUNTER — Telehealth: Payer: Self-pay

## 2018-06-06 NOTE — Telephone Encounter (Signed)
Please ask to increase lasix 80 mg in am and 80 mg in pm. I will call to check on them tomorrow.

## 2018-06-06 NOTE — Telephone Encounter (Signed)
Vaughan Basta aware.//ah

## 2018-06-06 NOTE — Telephone Encounter (Signed)
Spouse Vaughan Basta called stating that pt is still having a lot of leg edema, no pain. Taking Lasix 80mg  @ 8am, 40mg  @ 4pm. Please advise.//ah

## 2018-06-26 ENCOUNTER — Ambulatory Visit: Payer: Medicare Other | Admitting: Neurology

## 2018-07-04 DIAGNOSIS — N183 Chronic kidney disease, stage 3 (moderate): Secondary | ICD-10-CM | POA: Diagnosis not present

## 2018-07-04 DIAGNOSIS — I50810 Right heart failure, unspecified: Secondary | ICD-10-CM | POA: Diagnosis not present

## 2018-07-04 DIAGNOSIS — I129 Hypertensive chronic kidney disease with stage 1 through stage 4 chronic kidney disease, or unspecified chronic kidney disease: Secondary | ICD-10-CM | POA: Diagnosis not present

## 2018-07-11 ENCOUNTER — Telehealth: Payer: Self-pay

## 2018-07-11 NOTE — Telephone Encounter (Signed)
Pt wife called and says can you please give them a call they are having problems with his leg edema and weight gain. She wants to talk to you so I have very little information; Thanks   [phone], [phone]

## 2018-07-11 NOTE — Telephone Encounter (Signed)
Worsening bilateral leg swelling and weight gain.  Weight has increased from 216 pounds a week ago, now to 224 pounds.  Recent visit with nephrologist Dr. Joelyn Oms showed improved BUN/creatinine of 21/1.4, with EGFR 46. Amlodipine was stopped at that visit. BP has been running 140s/80s.  I recommended the patient to increase Lasix to 80 mg 3 times a day.  I will see the patient for virtual visit in 1 week from now.  Next options will be switching Lasix to torsemide, or adding metolazone.

## 2018-07-20 ENCOUNTER — Encounter: Payer: Self-pay | Admitting: Cardiology

## 2018-07-20 ENCOUNTER — Other Ambulatory Visit: Payer: Self-pay

## 2018-07-20 ENCOUNTER — Ambulatory Visit (INDEPENDENT_AMBULATORY_CARE_PROVIDER_SITE_OTHER): Payer: Medicare Other | Admitting: Cardiology

## 2018-07-20 VITALS — BP 143/75 | HR 85 | Ht 71.0 in | Wt 215.0 lb

## 2018-07-20 DIAGNOSIS — I4821 Permanent atrial fibrillation: Secondary | ICD-10-CM | POA: Diagnosis not present

## 2018-07-20 DIAGNOSIS — I2510 Atherosclerotic heart disease of native coronary artery without angina pectoris: Secondary | ICD-10-CM

## 2018-07-20 DIAGNOSIS — I272 Pulmonary hypertension, unspecified: Secondary | ICD-10-CM

## 2018-07-20 DIAGNOSIS — I10 Essential (primary) hypertension: Secondary | ICD-10-CM

## 2018-07-20 DIAGNOSIS — R600 Localized edema: Secondary | ICD-10-CM | POA: Diagnosis not present

## 2018-07-20 NOTE — Progress Notes (Signed)
Patient is here for follow up visit.  Subjective:   _0  ID: [name], male    DOB: 08-Nov-1935, 83 y.o.   MRN: [id number]  Chief Complaint  Patient presents with   Leg Swelling   Weight Gain   Hypertension   Follow-up   Coronary Artery Disease     HPI  83 year old Caucasian male with coronary artery disease status (LAD PCI 2018), now permanentl atrial fibrillation, complex valvular heart disease, mod AI, mod TR, moderate pulmonary hypertension, venous insufficiency, Parkinsons disease, GI bleed in 08/2017 from internal hemorrhoids, now s/p hemorrhoidectomy.  On my telephone encounter with the patient on 07/11/2018, he had noted worsening bilateral leg swelling and weight gain.  Weight had increased from 216 pounds a week ago, now to 224 pounds.  Recent visit with nephrologist Dr. Joelyn Oms showed improved BUN/creatinine of 21/1.4, with EGFR 46. Amlodipine was stopped at that visit. BP has been running 140s/80s. I recommended the patient to increase Lasix to 80 mg 3 times a day. Next options will be switching Lasix to torsemide, or adding metolazone.  Since then, he has noted improvement in his leg swelling. He is currently taking lasix 80 mg tid. His weight his down to 97.5 Kg (214 lb). BP has been around 140.80. He has resumed amlodipine 5 mg daily.    Past Medical History:  Diagnosis Date   Arthritis    "knees; lower back" (08/09/2016)   Atrial fibrillation ([place])    remote hx/notes 08/07/2016   Coronary artery disease    Episodes of trembling    "most of the time it's my left hand" (08/09/2016)   GI bleeding 08/2017   High cholesterol    History of kidney stones    Hypertension    Melanoma of shoulder ([place]) 2000s   "left"   Mitral regurgitation and mitral stenosis    hx/notes 08/07/2016   Moderate aortic stenosis    hx/notes 08/07/2016   Parkinson's disease ([place])    Prostate cancer ([place]) 2011   hx/notes 08/07/2016     Past Surgical  History:  Procedure Laterality Date   [place]  [place] [address] [place]   CARDIOVERSION N/A 01/26/2017   Procedure: CARDIOVERSION;  Surgeon: [name], MD;  Location: [facility];  Service: Cardiovascular;  Laterality: N/A;   CARDIOVERSION N/A 03/06/2018   Procedure: CARDIOVERSION;  Surgeon: [name], MD;  Location: [facility];  Service: Cardiovascular;  Laterality: N/A;   CATARACT EXTRACTION W/ INTRAOCULAR LENS  IMPLANT, BILATERAL Bilateral 2014-02/2016   left-right; hx/notes 08/07/2016   COLONOSCOPY WITH PROPOFOL N/A 09/25/2017   Procedure: COLONOSCOPY WITH PROPOFOL;  Surgeon: Laurence Spates, MD;  Location: [facility];  Service: Endoscopy;  Laterality: N/A;   CORONARY ANGIOPLASTY WITH STENT PLACEMENT  08/09/2016   CORONARY ATHERECTOMY N/A 08/09/2016   Procedure: Coronary Atherectomy;  Surgeon: Adrian Prows, MD;  Location: [facility];  Service: Cardiovascular;  Laterality: N/A;   CORONARY STENT INTERVENTION N/A 08/09/2016   Procedure: Coronary Stent Intervention;  Surgeon: Adrian Prows, MD;  Location: [facility];  Service: Cardiovascular;  Laterality: N/A;  LAD   EYE SURGERY Bilateral 05/2016   "laser on both lenses"   HEMORRHOID SURGERY N/A 09/27/2017   Procedure: HEMORRHOIDECTOMY;  Surgeon: Erroll Luna, MD;  Location: [facility];  Service: General;  Laterality: N/A;   INGUINAL HERNIA REPAIR Left 1991   [place]  06/25/2009   INTRAVASCULAR PRESSURE WIRE/FFR STUDY N/A 08/09/2016   Procedure:  Intravascular Pressure Wire/FFR Study;  Surgeon: Adrian Prows, MD;  Location: [facility];  Service: Cardiovascular;  Laterality: N/A;   [place] Left 1990s   "shoulder"   RIGHT HEART CATH N/A 03/20/2018   Procedure: RIGHT HEART CATH;  Surgeon: [name], MD;  Location: [facility];  Service: Cardiovascular;  Laterality: N/A;   RIGHT/LEFT HEART  CATH AND CORONARY ANGIOGRAPHY N/A 08/09/2016   Procedure: Right/Left Heart Cath and Coronary Angiography;  Surgeon: Adrian Prows, MD;  Location: [facility];  Service: Cardiovascular;  Laterality: N/A;   TEE WITHOUT CARDIOVERSION N/A 01/25/2017   Procedure: TRANSESOPHAGEAL ECHOCARDIOGRAM (TEE);  Surgeon: [name], MD;  Location: [facility];  Service: Cardiovascular;  Laterality: N/A;     Social History   Socioeconomic History   Marital status: Soil scientist    Spouse name: Not on file   Number of children: 72   Years of education: Not on file   Highest education level: Not on file  Occupational History   Occupation: retired    Comment: food business - Engineer, drilling strain: Not on file   Food insecurity:    Worry: Not on file    Inability: Not on file   Transportation needs:    Medical: Not on file    Non-medical: Not on file  Tobacco Use   Smoking status: Never Smoker   Smokeless tobacco: Never Used   Tobacco comment: "smoked  2 months when I was 20; non since" (08/09/2016)  Substance and Sexual Activity   Alcohol use: Yes    Alcohol/week: 14.0 standard drinks    Types: 14 Shots of liquor per week    Comment: 1drinks per day (canadian mist)   Drug use: No   Sexual activity: Not Currently  Lifestyle   Physical activity:    Days per week: Not on file    Minutes per session: Not on file   Stress: Not on file  Relationships   Social connections:    Talks on phone: Not on file    Gets together: Not on file    Attends religious service: Not on file    Active member of club or organization: Not on file    Attends meetings of clubs or organizations: Not on file    Relationship status: Not on file   Intimate partner violence:    Fear of current or ex partner: Not on file    Emotionally abused: Not on file    Physically abused: Not on file    Forced sexual activity: Not on file  Other Topics Concern    Not on file  Social History Narrative   Retired Hotel manager with [employer]     Current Outpatient Medications on File Prior to Visit  Medication Sig Dispense Refill   albuterol (PROVENTIL HFA;VENTOLIN HFA) 108 (90 Base) MCG/ACT inhaler Inhale 2 puffs into the lungs every 4 (four) hours as needed for wheezing or shortness of breath. 1 Inhaler 0   amLODipine (NORVASC) 5 MG tablet Take 5 mg by mouth daily. (0800)     apixaban (ELIQUIS) 2.5 MG TABS tablet Take 2.5 mg by mouth 2 (two) times daily. (0800 & 2000)     carbidopa-levodopa (SINEMET IR) 25-100 MG tablet 2 at 7am/2 at 11am/1 at 4pm (Patient taking differently: Take 1-2 tablets by mouth See admin instructions. Take 2 tablets by mouth at 0700,  take 2 tablets  by mouth at 11 am,  & take 1 tablet by mouth at 4pm) 540 tablet 1   Cholecalciferol (VITAMIN D-3) 1000 units CAPS Take 1,000 Units by mouth daily. (0800)     furosemide (LASIX) 40 MG tablet 2 in am, 1 in afternoon (Patient taking differently: Take 40-80 mg by mouth See admin instructions. Take 2 tablets by mouth in the morning and 1 tablet in the afternoon) 60 tablet 0   gabapentin (NEURONTIN) 300 MG capsule Take 300 mg by mouth 3 (three) times daily. 22:30      Multiple Vitamins-Minerals (PRESERVISION AREDS 2 PO) Take 1 tablet by mouth 2 (two) times daily. (0800 & 2000)     nitroGLYCERIN (NITROSTAT) 0.4 MG SL tablet Place 0.4 mg under the tongue every 5 (five) minutes as needed for chest pain.     polyethylene glycol (MIRALAX / GLYCOLAX) packet Take 17 g by mouth 2 (two) times daily. 14 each 0   rosuvastatin (CRESTOR) 10 MG tablet Take 10 mg by mouth daily at 8 pm. (2000)     No current facility-administered medications on file prior to visit.     Cardiovascular studies:  EKG 03/14/2018: Sinus rhythm 64 bpm. First degree AV block. Normal axis. IVCD. left atrial enlargement. Poor R wave profression.   [place] 03/20/2018: WHO Grp II pulmonary hypertension Mean PA 42 mmHg, PVR  2.4 WU Normal RV systolic function (PAPi 2.4) Recommendation: Continue medical management with aggressive diuresis.  Echocardiogram 01/02/2018: Left ventricle cavity is normal in size. Moderate concentric hypertrophy of the left ventricle. Low normal global wall motion. Visual EF is 50-55%. Unable to evaluate diastolic function due to A. Fibrillation. Calculated EF 51%. Left atrial cavity is severely dilated. Right atrial cavity is moderately dilated. Trileaflet aortic valve with moderate aortic valve leaflet calcification. Trace aortic stenosis. Moderate (Grade II) aortic regurgitation. Moderate (Grade II) mitral regurgitation. Moderate tricuspid regurgitation. Mild pulmonary hypertension. Estimated pulmonary artery systolic pressure 65 mmHg. Compared to previous echocardiogram on 09/01/2016, there is interval increase in severity of aortic regurgitation, tricuspid regurgitation, and pulmonary hypertension.  Echocardiogram 01/02/2018: Left ventricle cavity is normal in size. Moderate concentric hypertrophy of the left ventricle. Low normal global wall motion. Visual EF is 50-55%. Unable to evaluate diastolic function due to A. Fibrillation. Calculated EF 51%. Left atrial cavity is severely dilated. Right atrial cavity is moderately dilated. Trileaflet aortic valve with moderate aortic valve leaflet calcification. Trace aortic stenosis. Moderate (Grade II) aortic regurgitation. Moderate (Grade II) mitral regurgitation. Moderate tricuspid regurgitation. Mild pulmonary hypertension. Estimated pulmonary artery systolic pressure 65 mmHg. Compared to previous echocardiogram on 09/01/2016, there is interval increase in severity of aortic regurgitation, tricuspid regurgitation, and pulmonary hypertension.  Event monitor 04/27/2017 - 05/26/2017: Persistent atrial fibrillation with controlled ventricular rate.  Two episodes of 3 beat nonsustained VT 4.6 second pause at 11:34 PM on 05/03/2017.   3.9 second pause at 7 AM on 05/14/2017. Symptoms not reported. Occasional episodes of atypical atrial flutter with variable conduction  Lower extremity venous duplex 04/07/2017: No evidence of deep vein thrombosis of the right lower extremity with normal venous return. No evidence of valvular incompetence (chronic venous insufficiency) of the right lower extremity.  No evidence of deep vein thrombosis of the left lower extremity with normal venous return. Chronic valvular incompetence (chronic venous insufficiency) of the left lower extremity. Marked reflux noted in the left sapheno-femoral junction (2.5 s), great saphenous proximal thigh (2.6 s), great saphenous mid thigh (1.6 s), great saphenous distal thigh (3.9 s) and  great saphenous proximal calf (3.4 s) veins.  Cardioversion 01/26/2017: S/p successful TEE guided cardioversion 100 JS/p successful TEE guided cardioversion 100 J  Coronary angiogram 08/09/2016: FFR guided CSI atherectomy followed by 3.0 x 26 and 3.0 x 18 mm onyx resolute DES to Mid LAD. Mild disease in Cx and RCA.  Lower Extremity Dopplers 06/2009: Pt has Baker Cyst behind both knees.   Recent labs:   CMP Latest Ref Rng & Units 04/12/2018  Glucose 70 - 99 mg/dL 94  BUN 8 - 23 mg/dL 27(H)  Creatinine 0.61 - 1.24 mg/dL 2.13(H)  Sodium 135 - 145 mmol/L 137  Potassium 3.5 - 5.1 mmol/L 3.3(L)  Chloride 98 - 111 mmol/L 94(L)  CO2 22 - 32 mmol/L 35(H)  Calcium 8.9 - 10.3 mg/dL 8.7(L)  Total Protein 6.5 - 8.1 g/dL 7.4  Total Bilirubin 0.3 - 1.2 mg/dL 2.9(H)  Alkaline Phos 38 - 126 U/L 171(H)  AST 15 - 41 U/L 26  ALT 0 - 44 U/L 5  eGFR non Af Amer >60 mL/min 28(L)   CBC Latest Ref Rng & Units 04/12/2018  WBC 4.0 - 10.5 K/uL 3.7(L)  Hemoglobin 13.0 - 17.0 g/dL 9.6(L)  Hematocrit 39.0 - 52.0 % 32.9(L)  Platelets 150 - 400 K/uL 165    01/02/2018: Lipids: Cholesterol 106, triglycerides 48, HDL 46, LDL 50.    Review of Systems  Constitution: Negative for decreased  appetite, malaise/fatigue, weight gain and weight loss.  HENT: Negative for congestion.   Eyes: Negative for visual disturbance.  Cardiovascular: Positive for dyspnea on exertion (Significantly improved) and leg swelling (Significantly improved). Negative for chest pain, palpitations and syncope.  Respiratory: Negative for shortness of breath.   Endocrine: Negative for cold intolerance.  Hematologic/Lymphatic: Does not bruise/bleed easily.  Skin: Negative for itching and rash.  Musculoskeletal: Positive for joint pain (Bilateral knees). Negative for myalgias.  Gastrointestinal: Negative for abdominal pain, nausea and vomiting.  Genitourinary: Negative for dysuria.  Neurological: Negative for dizziness and weakness.  Psychiatric/Behavioral: The patient is not nervous/anxious.   All other systems reviewed and are negative.      Objective:    Vitals:   07/20/18 1140 07/20/18 1141  BP: (!) 155/79 (!) 143/75  Pulse: 85   SpO2:     Filed Weights   07/20/18 1109  Weight: 97.5 kg      Physical Exam  Constitutional: He is oriented to person, place, and time. He appears well-developed and well-nourished. No distress.  HENT:  Head: Normocephalic and atraumatic.  Eyes: Pupils are equal, round, and reactive to light. Conjunctivae are normal.  Neck: Neck supple. No JVD present. No thyromegaly present.  Cardiovascular: Normal rate, intact distal pulses and normal pulses. An irregularly irregular rhythm present. Exam reveals no gallop, no S3 and no S4.  Murmur heard.  Midsystolic murmur is present with a grade of 3/6. Aortic Area S1 is variable, S2 is normal.  Pulmonary/Chest: Effort normal and breath sounds normal. He has no wheezes. He has no rales.  Abdominal: Soft. Bowel sounds are normal. There is no rebound.  Musculoskeletal: Normal range of motion.        General: No edema.  Lymphadenopathy:    He has no cervical adenopathy.  Neurological: He is alert and oriented to person,  place, and time. No cranial nerve deficit.  Skin: Skin is warm and dry.  Psychiatric: He has a normal mood and affect.  Nursing note and vitals reviewed.       Assessment & Recommendations:  83 year old Caucasian male with coronary artery disease status (LAD PCI 2018), now permanentl atrial fibrillation, complex valvular heart disease, mod AI, mod TR, moderate pulmonary hypertension, venous insufficiency, Parkinsons disease, hemorrhoidal GI bleed in 08/2017  Leg edema: Secondary to WHO Grp II pulmonary hypertension, mixed valvular disease, and venous insufficiency. Continue lasix 80 mg tid for now. I will check his labs, since he has been on high dose lasix. Depending on labs today, I will consider adding spironolactone to his regimen.   Permanent atrial fibrilation: Rate controlled.  CHA2DS2VASc score 3: Annual stroke risk 3.2% Continue Eliquis 2.5 mg bid.   Valvular disease: Mod aortic, motral, tricuspid regurg, likely all due to atrial fibrilation. Rate control for Afib, as above.  Pulmonary hypertension: WHO Grp II pulmonary hypertension due to left sided heart diseease (HFpEF, valvular heart disease, Afib- [facility] 02/2018 showed PCWP 27 mmHg). Recommend continued management of hypertension and diuresis.   Hypertension: Fairly well controlled on current therapy.  Venous insufficiency: Prefers conservative management. Continue to wear compression stockings.  H/o GI bleed: S/p hemorrhoidectomy in 08/2017.Currently, no bleeding with stable Hb. Tolerating Eliquis well.  CAD: Stable. Not on aspirin due to bleeding risk. Continue statin. Given his advanced age, renal dysfunction, recommend conservative medical management  I will see him back in 2-3 weeks.   [name], MD [facility] [place] Pager: [phone] Office: [phone] If no answer Cell [phone]

## 2018-07-21 ENCOUNTER — Encounter: Payer: Self-pay | Admitting: Cardiology

## 2018-07-21 DIAGNOSIS — I10 Essential (primary) hypertension: Secondary | ICD-10-CM | POA: Insufficient documentation

## 2018-07-21 DIAGNOSIS — R600 Localized edema: Secondary | ICD-10-CM | POA: Insufficient documentation

## 2018-07-21 LAB — BASIC METABOLIC PANEL
BUN/Creatinine Ratio: 21 (ref 10–24)
BUN: 35 mg/dL — ABNORMAL HIGH (ref 8–27)
CO2: 21 mmol/L (ref 20–29)
Calcium: 9.4 mg/dL (ref 8.6–10.2)
Chloride: 97 mmol/L (ref 96–106)
Creatinine, Ser: 1.67 mg/dL — ABNORMAL HIGH (ref 0.76–1.27)
GFR calc Af Amer: 44 mL/min/{1.73_m2} — ABNORMAL LOW (ref >59)
GFR calc non Af Amer: 38 mL/min/{1.73_m2} — ABNORMAL LOW (ref >59)
Glucose: 41 mg/dL — ABNORMAL LOW (ref 65–99)
Potassium: 3.8 mmol/L (ref 3.5–5.2)
Sodium: 146 mmol/L — ABNORMAL HIGH (ref 134–144)

## 2018-07-31 IMAGING — DX DG CHEST 1V PORT
1 series · 1 of 1 positions shown · non-contrast
Comparison: Chest x-ray of 05/25/2009

CLINICAL DATA: Pain radiating between the shoulder blades and in to
the left arm

EXAM:
PORTABLE CHEST 1 VIEW

[chest ap]
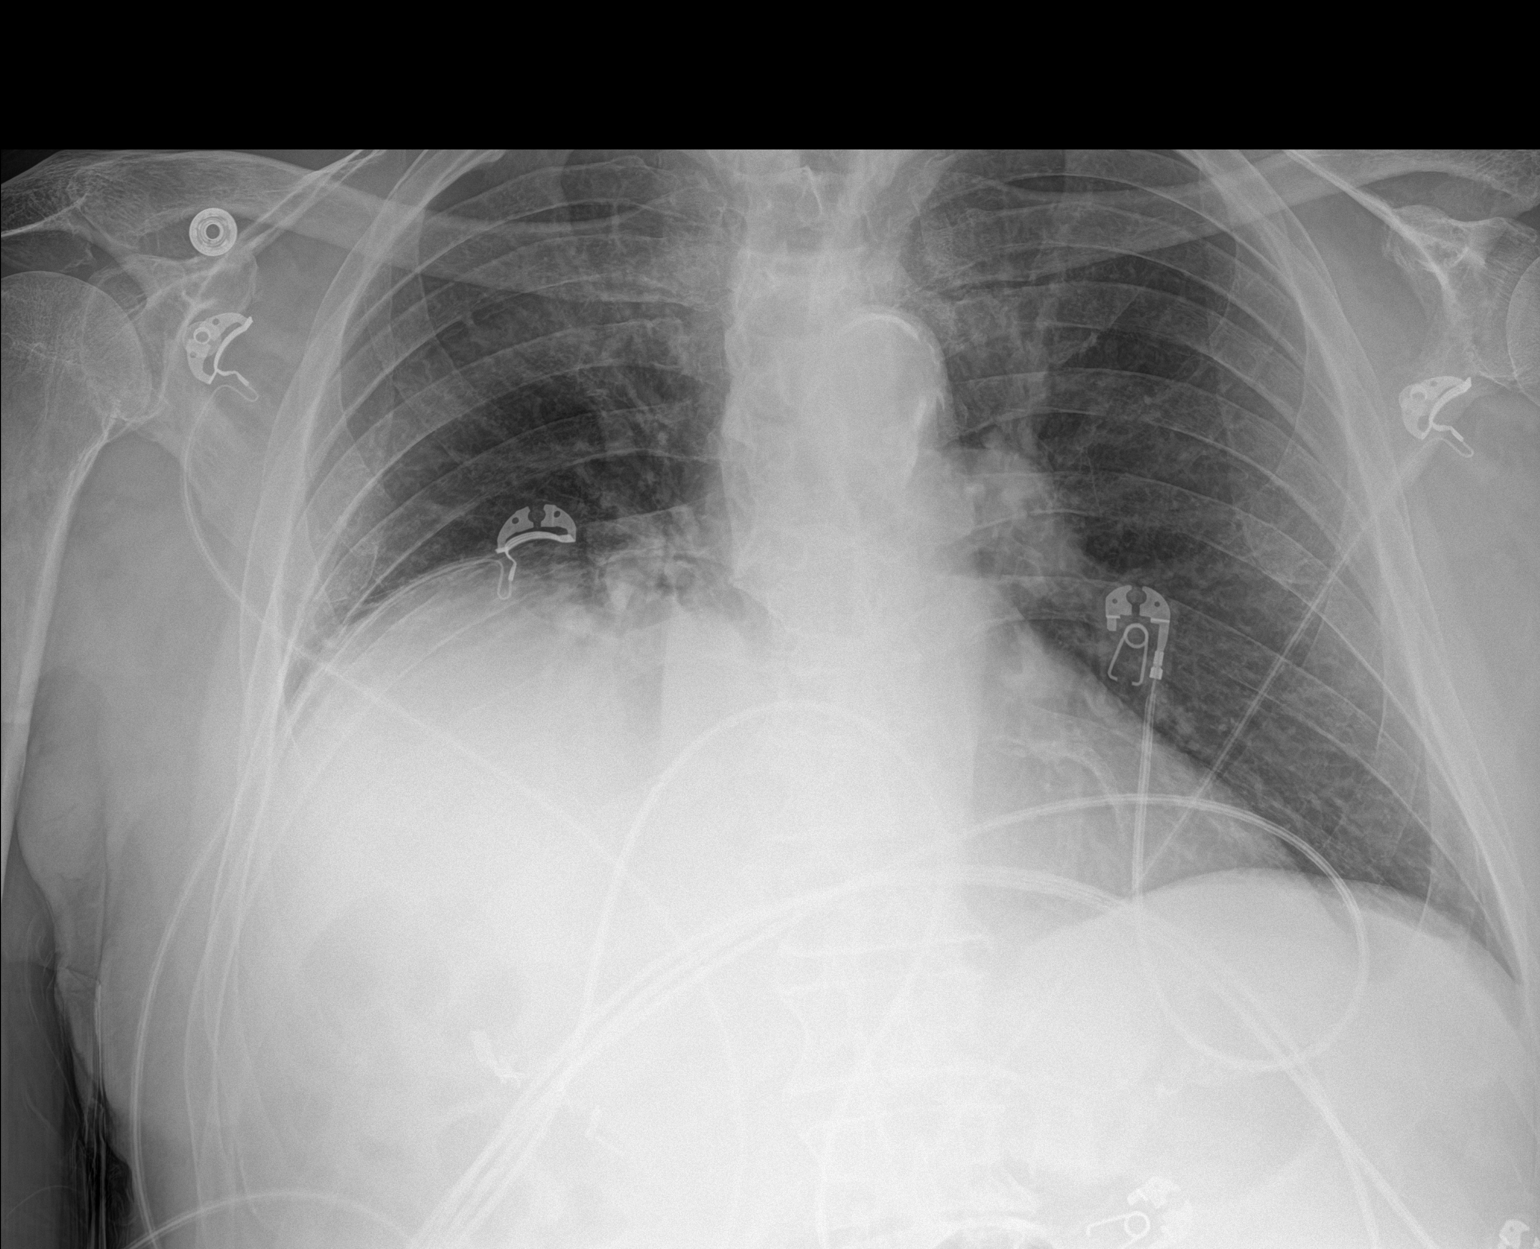

[1 of 1 positions shown; findings below may reference images not displayed]

FINDINGS: Chronic elevation of the right hemidiaphragm is stable with mild
right basilar volume loss. The left lung is clear. Mediastinal and
hilar contours are unremarkable. Heart size is stable. No bony
abnormality is seen.
IMPRESSION: Stable chronic elevation of the right hemidiaphragm. No definite
active process.

## 2018-08-03 ENCOUNTER — Ambulatory Visit (INDEPENDENT_AMBULATORY_CARE_PROVIDER_SITE_OTHER): Payer: Medicare Other | Admitting: Cardiology

## 2018-08-03 ENCOUNTER — Other Ambulatory Visit: Payer: Self-pay

## 2018-08-03 ENCOUNTER — Encounter: Payer: Self-pay | Admitting: Cardiology

## 2018-08-03 VITALS — BP 142/74 | HR 92 | Ht 71.0 in | Wt 210.0 lb

## 2018-08-03 DIAGNOSIS — I272 Pulmonary hypertension, unspecified: Secondary | ICD-10-CM

## 2018-08-03 DIAGNOSIS — I4821 Permanent atrial fibrillation: Secondary | ICD-10-CM

## 2018-08-03 DIAGNOSIS — I2510 Atherosclerotic heart disease of native coronary artery without angina pectoris: Secondary | ICD-10-CM

## 2018-08-03 DIAGNOSIS — I10 Essential (primary) hypertension: Secondary | ICD-10-CM

## 2018-08-03 DIAGNOSIS — R600 Localized edema: Secondary | ICD-10-CM | POA: Diagnosis not present

## 2018-08-03 NOTE — Progress Notes (Addendum)
Patient is here for follow up visit.  Subjective:   @Patient  ID: [name], male    DOB: [birth year], 83 y.o.   MRN: [id number]  Chief Complaint  Patient presents with  . Coronary Artery Disease  . Atrial Flutter  . Follow-up     HPI  83 year old Caucasian male with coronary artery disease status (LAD PCI 2018), now permanentl atrial fibrillation, complex valvular heart disease, mod AI, mod TR, moderate pulmonary hypertension, venous insufficiency, Parkinsons disease, GI bleed in 08/2017 from internal hemorrhoids, now s/p hemorrhoidectomy.  He has been doing fairly well after increased dose of Lasix.  Leg swelling has improved.  He has lost 6 pounds since last visit.  He continues to have dyspnea with more than usual exertion, but is able to perform most of his activities of daily living, as well as working around the house without much difficulty.  Today, patient shared with me, his advance care directive.  He does not want to be on any life prolonging measures, if he is deemed to have dementia, vegetative state.  However, he does not mind undergoing resuscitation, as medically necessary.  Past Medical History:  Diagnosis Date  . Arthritis    "knees; lower back" (08/09/2016)  . Atrial fibrillation ([place])    remote hx/notes 08/07/2016  . Coronary artery disease   . Episodes of trembling    "most of the time it's my left hand" (08/09/2016)  . GI bleeding 08/2017  . High cholesterol   . History of kidney stones   . Hypertension   . Melanoma of shoulder ([place]) 2000s   "left"  . Mitral regurgitation and mitral stenosis    hx/notes 08/07/2016  . Moderate aortic stenosis    hx/notes 08/07/2016  . Parkinson's disease ([place])   . Prostate cancer ([place]) 2011   hx/notes 08/07/2016     Past Surgical History:  Procedure Laterality Date  . CARDIAC CATHETERIZATION  ~ [address], [place]"  . CARDIOVERSION N/A 01/26/2017   Procedure: CARDIOVERSION;  Surgeon: [name], MD;  Location: [facility];  Service: Cardiovascular;  Laterality: N/A;  . CARDIOVERSION N/A 03/06/2018   Procedure: CARDIOVERSION;  Surgeon: [name], MD;  Location: [facility];  Service: Cardiovascular;  Laterality: N/A;  . CATARACT EXTRACTION W/ INTRAOCULAR LENS  IMPLANT, BILATERAL Bilateral 2014-02/2016   left-right; hx/notes 08/07/2016  . COLONOSCOPY WITH PROPOFOL N/A 09/25/2017   Procedure: COLONOSCOPY WITH PROPOFOL;  Surgeon: Laurence Spates, MD;  Location: [facility];  Service: Endoscopy;  Laterality: N/A;  . CORONARY ANGIOPLASTY WITH STENT PLACEMENT  08/09/2016  . CORONARY ATHERECTOMY N/A 08/09/2016   Procedure: Coronary Atherectomy;  Surgeon: Adrian Prows, MD;  Location: [facility];  Service: Cardiovascular;  Laterality: N/A;  . CORONARY STENT INTERVENTION N/A 08/09/2016   Procedure: Coronary Stent Intervention;  Surgeon: Adrian Prows, MD;  Location: [facility];  Service: Cardiovascular;  Laterality: N/A;  LAD  . EYE SURGERY Bilateral 05/2016   "laser on both lenses"  . HEMORRHOID SURGERY N/A 09/27/2017   Procedure: HEMORRHOIDECTOMY;  Surgeon: Erroll Luna, MD;  Location: [facility];  Service: General;  Laterality: N/A;  . INGUINAL HERNIA REPAIR Left 1991  . INSERTION PROSTATE RADIATION SEED  06/25/2009  . INTRAVASCULAR PRESSURE WIRE/FFR STUDY N/A 08/09/2016   Procedure: Intravascular Pressure Wire/FFR Study;  Surgeon: Adrian Prows, MD;  Location: [facility];  Service: Cardiovascular;  Laterality: N/A;  . LAPAROSCOPIC CHOLECYSTECTOMY  1993  . MELANOMA EXCISION Left 1990s   "  shoulder"  . RIGHT HEART CATH N/A 03/20/2018   Procedure: RIGHT HEART CATH;  Surgeon: [name], MD;  Location: [facility];  Service: Cardiovascular;  Laterality: N/A;  . RIGHT/LEFT HEART CATH AND CORONARY ANGIOGRAPHY N/A 08/09/2016   Procedure: Right/Left Heart Cath and Coronary Angiography;  Surgeon: Adrian Prows, MD;  Location: [facility];  Service:  Cardiovascular;  Laterality: N/A;  . TEE WITHOUT CARDIOVERSION N/A 01/25/2017   Procedure: TRANSESOPHAGEAL ECHOCARDIOGRAM (TEE);  Surgeon: [name], MD;  Location: [facility];  Service: Cardiovascular;  Laterality: N/A;     Social History   Socioeconomic History  . Marital status: Soil scientist    Spouse name: Not on file  . Number of children: 1  . Years of education: Not on file  . Highest education level: Not on file  Occupational History  . 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 Financial resource strain: Not on file  . Food insecurity:    Worry: Not on file    Inability: Not on file  . Transportation needs:    Medical: Not on file    Non-medical: Not on file  Tobacco Use  . Smoking status: Never Smoker  . Smokeless tobacco: Never Used  . Tobacco comment: "smoked  2 months when I was 20; non since" (08/09/2016)  Substance and Sexual Activity  . Alcohol use: Yes    Alcohol/week: 14.0 standard drinks    Types: 14 Shots of liquor per week    Comment: 1drinks per day (canadian mist)  . Drug use: No  . Sexual activity: Not Currently  Lifestyle  . Physical activity:    Days per week: Not on file    Minutes per session: Not on file  . Stress: Not on file  Relationships  . Social connections:    Talks on phone: Not on file    Gets together: Not on file    Attends religious service: Not on file    Active member of club or organization: Not on file    Attends meetings of clubs or organizations: Not on file    Relationship status: Not on file  . Intimate partner violence:    Fear of current or ex partner: Not on file    Emotionally abused: Not on file    Physically abused: Not on file    Forced sexual activity: Not on file  Other Topics Concern  . Not on file  Social History Narrative   Retired Hotel manager with [employer]     Current Outpatient Medications on File Prior to Visit  Medication Sig Dispense Refill  .  albuterol (PROVENTIL HFA;VENTOLIN HFA) 108 (90 Base) MCG/ACT inhaler Inhale 2 puffs into the lungs every 4 (four) hours as needed for wheezing or shortness of breath. 1 Inhaler 0  . amLODipine (NORVASC) 5 MG tablet Take 5 mg by mouth daily. (0800)    . apixaban (ELIQUIS) 2.5 MG TABS tablet Take 2.5 mg by mouth 2 (two) times daily. (0800 & 2000)    . carbidopa-levodopa (SINEMET IR) 25-100 MG tablet 2 at 7am/2 at 11am/1 at 4pm (Patient taking differently: Take 1-2 tablets by mouth See admin instructions. Take 2 tablets by mouth at 0700,  take 2 tablets by mouth at 11 am,  & take 1 tablet by mouth at 8pm) 540 tablet 1  . Cholecalciferol (VITAMIN D-3) 1000 units CAPS Take 1,000 Units by mouth daily. (0800)    .  furosemide (LASIX) 40 MG tablet 2 in am, 1 in afternoon (Patient taking differently: Take 80 mg by mouth 3 (three) times daily. ) 60 tablet 0  . gabapentin (NEURONTIN) 300 MG capsule Take 300 mg by mouth 2 (two) times daily. 22:30     . Multiple Vitamins-Minerals (PRESERVISION AREDS 2 PO) Take 1 tablet by mouth 2 (two) times daily. (0800 & 2000)    . nitroGLYCERIN (NITROSTAT) 0.4 MG SL tablet Place 0.4 mg under the tongue every 5 (five) minutes as needed for chest pain.    . polyethylene glycol (MIRALAX / GLYCOLAX) packet Take 17 g by mouth 2 (two) times daily. 14 each 0  . rosuvastatin (CRESTOR) 10 MG tablet Take 10 mg by mouth daily at 8 pm. (2000)     No current facility-administered medications on file prior to visit.     Cardiovascular studies:  EKG 03/14/2018: Sinus rhythm 64 bpm. First degree AV block. Normal axis. IVCD. left atrial enlargement. Poor R wave profression.   Friedens 03/20/2018: WHO Grp II pulmonary hypertension Mean PA 42 mmHg, PVR 2.4 WU Normal RV systolic function (PAPi 2.4) Recommendation: Continue medical management with aggressive diuresis.  Echocardiogram 01/02/2018: Left ventricle cavity is normal in size. Moderate concentric hypertrophy of the left ventricle.  Low normal global wall motion. Visual EF is 50-55%. Unable to evaluate diastolic function due to A. Fibrillation. Calculated EF 51%. Left atrial cavity is severely dilated. Right atrial cavity is moderately dilated. Trileaflet aortic valve with moderate aortic valve leaflet calcification. Trace aortic stenosis. Moderate (Grade II) aortic regurgitation. Moderate (Grade II) mitral regurgitation. Moderate tricuspid regurgitation. Mild pulmonary hypertension. Estimated pulmonary artery systolic pressure 65 mmHg. Compared to previous echocardiogram on 09/01/2016, there is interval increase in severity of aortic regurgitation, tricuspid regurgitation, and pulmonary hypertension.  Echocardiogram 01/02/2018: Left ventricle cavity is normal in size. Moderate concentric hypertrophy of the left ventricle. Low normal global wall motion. Visual EF is 50-55%. Unable to evaluate diastolic function due to A. Fibrillation. Calculated EF 51%. Left atrial cavity is severely dilated. Right atrial cavity is moderately dilated. Trileaflet aortic valve with moderate aortic valve leaflet calcification. Trace aortic stenosis. Moderate (Grade II) aortic regurgitation. Moderate (Grade II) mitral regurgitation. Moderate tricuspid regurgitation. Mild pulmonary hypertension. Estimated pulmonary artery systolic pressure 65 mmHg. Compared to previous echocardiogram on 09/01/2016, there is interval increase in severity of aortic regurgitation, tricuspid regurgitation, and pulmonary hypertension.  Event monitor 04/27/2017 - 05/26/2017: Persistent atrial fibrillation with controlled ventricular rate.  Two episodes of 3 beat nonsustained VT 4.6 second pause at 11:34 PM on 05/03/2017.  3.9 second pause at 7 AM on 05/14/2017. Symptoms not reported. Occasional episodes of atypical atrial flutter with variable conduction  Lower extremity venous duplex 04/07/2017: No evidence of deep vein thrombosis of the right lower extremity  with normal venous return. No evidence of valvular incompetence (chronic venous insufficiency) of the right lower extremity.  No evidence of deep vein thrombosis of the left lower extremity with normal venous return. Chronic valvular incompetence (chronic venous insufficiency) of the left lower extremity. Marked reflux noted in the left sapheno-femoral junction (2.5 s), great saphenous proximal thigh (2.6 s), great saphenous mid thigh (1.6 s), great saphenous distal thigh (3.9 s) and great saphenous proximal calf (3.4 s) veins.  Cardioversion 01/26/2017: S/p successful TEE guided cardioversion 100 JS/p successful TEE guided cardioversion 100 J  Coronary angiogram 08/09/2016: FFR guided CSI atherectomy followed by 3.0 x 26 and 3.0 x 18 mm onyx resolute DES to Mid LAD. Mild  disease in Cx and RCA.  Lower Extremity Dopplers 06/2009: Pt has Baker Cyst behind both knees.   Recent labs: Results for ANVAY, TENNIS "[name]" (MRN [id number]) as of 08/03/2018 08:09  Ref. Range 04/12/2018 16:42 04/12/2018 17:50 07/20/2018 44:81  BASIC METABOLIC PANEL Unknown   Rpt (A)  COMPREHENSIVE METABOLIC PANEL Unknown Rpt (A)    Sodium Latest Ref Range: 134 - 144 mmol/L 137  146 (H)  Potassium Latest Ref Range: 3.5 - 5.2 mmol/L 3.3 (L)  3.8  Chloride Latest Ref Range: 96 - 106 mmol/L 94 (L)  97  CO2 Latest Ref Range: 20 - 29 mmol/L 35 (H)  21  Glucose Latest Ref Range: 65 - 99 mg/dL 94  41 (L)  BUN Latest Ref Range: 8 - 27 mg/dL 27 (H)  35 (H)  Creatinine Latest Ref Range: 0.76 - 1.27 mg/dL 2.13 (H)  1.67 (H)  Calcium Latest Ref Range: 8.6 - 10.2 mg/dL 8.7 (L)  9.4  Anion gap Latest Ref Range: 5 - 15  8    BUN/Creatinine Ratio Latest Ref Range: 10 - 24    21  Alkaline Phosphatase Latest Ref Range: 38 - 126 U/L 171 (H)    Albumin Latest Ref Range: 3.5 - 5.0 g/dL 3.5    AST Latest Ref Range: 15 - 41 U/L 26    ALT Latest Ref Range: 0 - 44 U/L 5    Total Protein Latest Ref Range: 6.5 - 8.1 g/dL 7.4    Total  Bilirubin Latest Ref Range: 0.3 - 1.2 mg/dL 2.9 (H)    GFR, Est Non African American Latest Ref Range: >59 mL/min/1.73 28 (L)  38 (L)  GFR, Est African American Latest Ref Range: >59 mL/min/1.73 33 (L)  44 (L)  B Natriuretic Peptide Latest Ref Range: 0.0 - 100.0 pg/mL  388.7 (H)   Troponin I Latest Ref Range: <0.03 ng/mL  0.04 (HH)    01/02/2018: Lipids: Cholesterol 106, triglycerides 48, HDL 46, LDL 50.    Review of Systems  Constitution: Negative for decreased appetite, malaise/fatigue, weight gain and weight loss.  HENT: Negative for congestion.   Eyes: Negative for visual disturbance.  Cardiovascular: Positive for dyspnea on exertion (Significantly improved) and leg swelling (Significantly improved). Negative for chest pain, palpitations and syncope.  Respiratory: Negative for shortness of breath.   Endocrine: Negative for cold intolerance.  Hematologic/Lymphatic: Does not bruise/bleed easily.  Skin: Negative for itching and rash.  Musculoskeletal: Positive for joint pain (Bilateral knees). Negative for myalgias.  Gastrointestinal: Negative for abdominal pain, nausea and vomiting.  Genitourinary: Negative for dysuria.  Neurological: Negative for dizziness and weakness.  Psychiatric/Behavioral: The patient is not nervous/anxious.   All other systems reviewed and are negative.      Objective:    Vitals:   08/03/18 1332  BP: (!) 142/74  Pulse: 92  SpO2: 97%   Filed Weights   08/03/18 1332  Weight: 210 lb (95.3 kg)      Physical Exam  Constitutional: He is oriented to person, place, and time. He appears well-developed and well-nourished. No distress.  HENT:  Head: Normocephalic and atraumatic.  Eyes: Pupils are equal, round, and reactive to light. Conjunctivae are normal.  Neck: Neck supple. No JVD present. No thyromegaly present.  Cardiovascular: Normal rate, intact distal pulses and normal pulses. An irregularly irregular rhythm present. Exam reveals no gallop, no S3  and no S4.  Murmur heard.  Midsystolic murmur is present with a grade of 3/6. Aortic Area S1 is  variable, S2 is normal.  Pulmonary/Chest: Effort normal and breath sounds normal. He has no wheezes. He has no rales.  Abdominal: Soft. Bowel sounds are normal. There is no rebound.  Musculoskeletal: Normal range of motion.        General: Edema (Trace b/l. Chronic venous insufficiency changes. ) present.  Lymphadenopathy:    He has no cervical adenopathy.  Neurological: He is alert and oriented to person, place, and time. No cranial nerve deficit.  Skin: Skin is warm and dry.  Psychiatric: He has a normal mood and affect.  Nursing note and vitals reviewed.       Assessment & Recommendations:     83 year old Caucasian male with coronary artery disease status (LAD PCI 2018), now permanentl atrial fibrillation, complex valvular heart disease, mod AI, mod TR, moderate pulmonary hypertension, venous insufficiency, Parkinsons disease, hemorrhoidal GI bleed in 08/2017  Leg edema: Secondary to WHO Grp II pulmonary hypertension, mixed valvular disease, and venous insufficiency. Improved with lasix 80 mg tid for now.  He has venous insufficiency which is contributing to his leg edema, along with pulmonary hypertension.  He is not interested in endovenous ablation.  He is not compliant with compression stockings, I have encouraged him to keep his legs elevated at night.  Permanent atrial fibrilation: Rate controlled.  CHA2DS2VASc score 3: Annual stroke risk 3.2% Continue Eliquis 2.5 mg bid.   Valvular disease: Mod aortic, motral, tricuspid regurg, likely all due to atrial fibrilation. Rate control for Afib, as above.  Pulmonary hypertension: WHO Grp II pulmonary hypertension due to left sided heart diseease (HFpEF, valvular heart disease, Afib- R[facility] 02/2018 showed PCWP 27 mmHg). Recommend continued management of hypertension and diuresis.   Hypertension: Fairly well controlled on current  therapy.  H/o GI bleed: S/p hemorrhoidectomy in 08/2017.Currently, no bleeding with stable Hb. Tolerating Eliquis well.  CAD: Stable. Not on aspirin due to bleeding risk. Continue statin. Given his advanced age, renal dysfunction, recommend conservative medical management  I will see him back in 3 months.  [name], MD [facility] [place] Pager: [phone] Office: [phone] If no answer Cell [phone]

## 2018-08-06 DIAGNOSIS — I8312 Varicose veins of left lower extremity with inflammation: Secondary | ICD-10-CM | POA: Diagnosis not present

## 2018-08-06 DIAGNOSIS — D0359 Melanoma in situ of other part of trunk: Secondary | ICD-10-CM | POA: Diagnosis not present

## 2018-08-06 DIAGNOSIS — D225 Melanocytic nevi of trunk: Secondary | ICD-10-CM | POA: Diagnosis not present

## 2018-08-06 DIAGNOSIS — I872 Venous insufficiency (chronic) (peripheral): Secondary | ICD-10-CM | POA: Diagnosis not present

## 2018-08-06 DIAGNOSIS — L812 Freckles: Secondary | ICD-10-CM | POA: Diagnosis not present

## 2018-08-06 DIAGNOSIS — D485 Neoplasm of uncertain behavior of skin: Secondary | ICD-10-CM | POA: Diagnosis not present

## 2018-08-06 DIAGNOSIS — D0361 Melanoma in situ of right upper limb, including shoulder: Secondary | ICD-10-CM | POA: Diagnosis not present

## 2018-08-06 DIAGNOSIS — I8311 Varicose veins of right lower extremity with inflammation: Secondary | ICD-10-CM | POA: Diagnosis not present

## 2018-08-06 DIAGNOSIS — L821 Other seborrheic keratosis: Secondary | ICD-10-CM | POA: Diagnosis not present

## 2018-08-06 DIAGNOSIS — Z85828 Personal history of other malignant neoplasm of skin: Secondary | ICD-10-CM | POA: Diagnosis not present

## 2018-08-06 DIAGNOSIS — Z85820 Personal history of malignant melanoma of skin: Secondary | ICD-10-CM | POA: Diagnosis not present

## 2018-08-06 DIAGNOSIS — L570 Actinic keratosis: Secondary | ICD-10-CM | POA: Diagnosis not present

## 2018-08-13 ENCOUNTER — Ambulatory Visit: Payer: Medicare Other | Admitting: Cardiology

## 2018-08-16 ENCOUNTER — Other Ambulatory Visit: Payer: Self-pay | Admitting: Cardiology

## 2018-08-16 DIAGNOSIS — D485 Neoplasm of uncertain behavior of skin: Secondary | ICD-10-CM | POA: Diagnosis not present

## 2018-08-16 DIAGNOSIS — D0361 Melanoma in situ of right upper limb, including shoulder: Secondary | ICD-10-CM | POA: Diagnosis not present

## 2018-08-16 DIAGNOSIS — C4361 Malignant melanoma of right upper limb, including shoulder: Secondary | ICD-10-CM | POA: Diagnosis not present

## 2018-08-16 DIAGNOSIS — Z85828 Personal history of other malignant neoplasm of skin: Secondary | ICD-10-CM | POA: Diagnosis not present

## 2018-08-16 DIAGNOSIS — Z85820 Personal history of malignant melanoma of skin: Secondary | ICD-10-CM | POA: Diagnosis not present

## 2018-08-18 IMAGING — DX DG CHEST 1V PORT
1 series · 1 of 1 positions shown · non-contrast
Comparison: 01/24/2017

CLINICAL DATA: Bilateral chest pain

EXAM:
PORTABLE CHEST 1 VIEW

[chest ap]
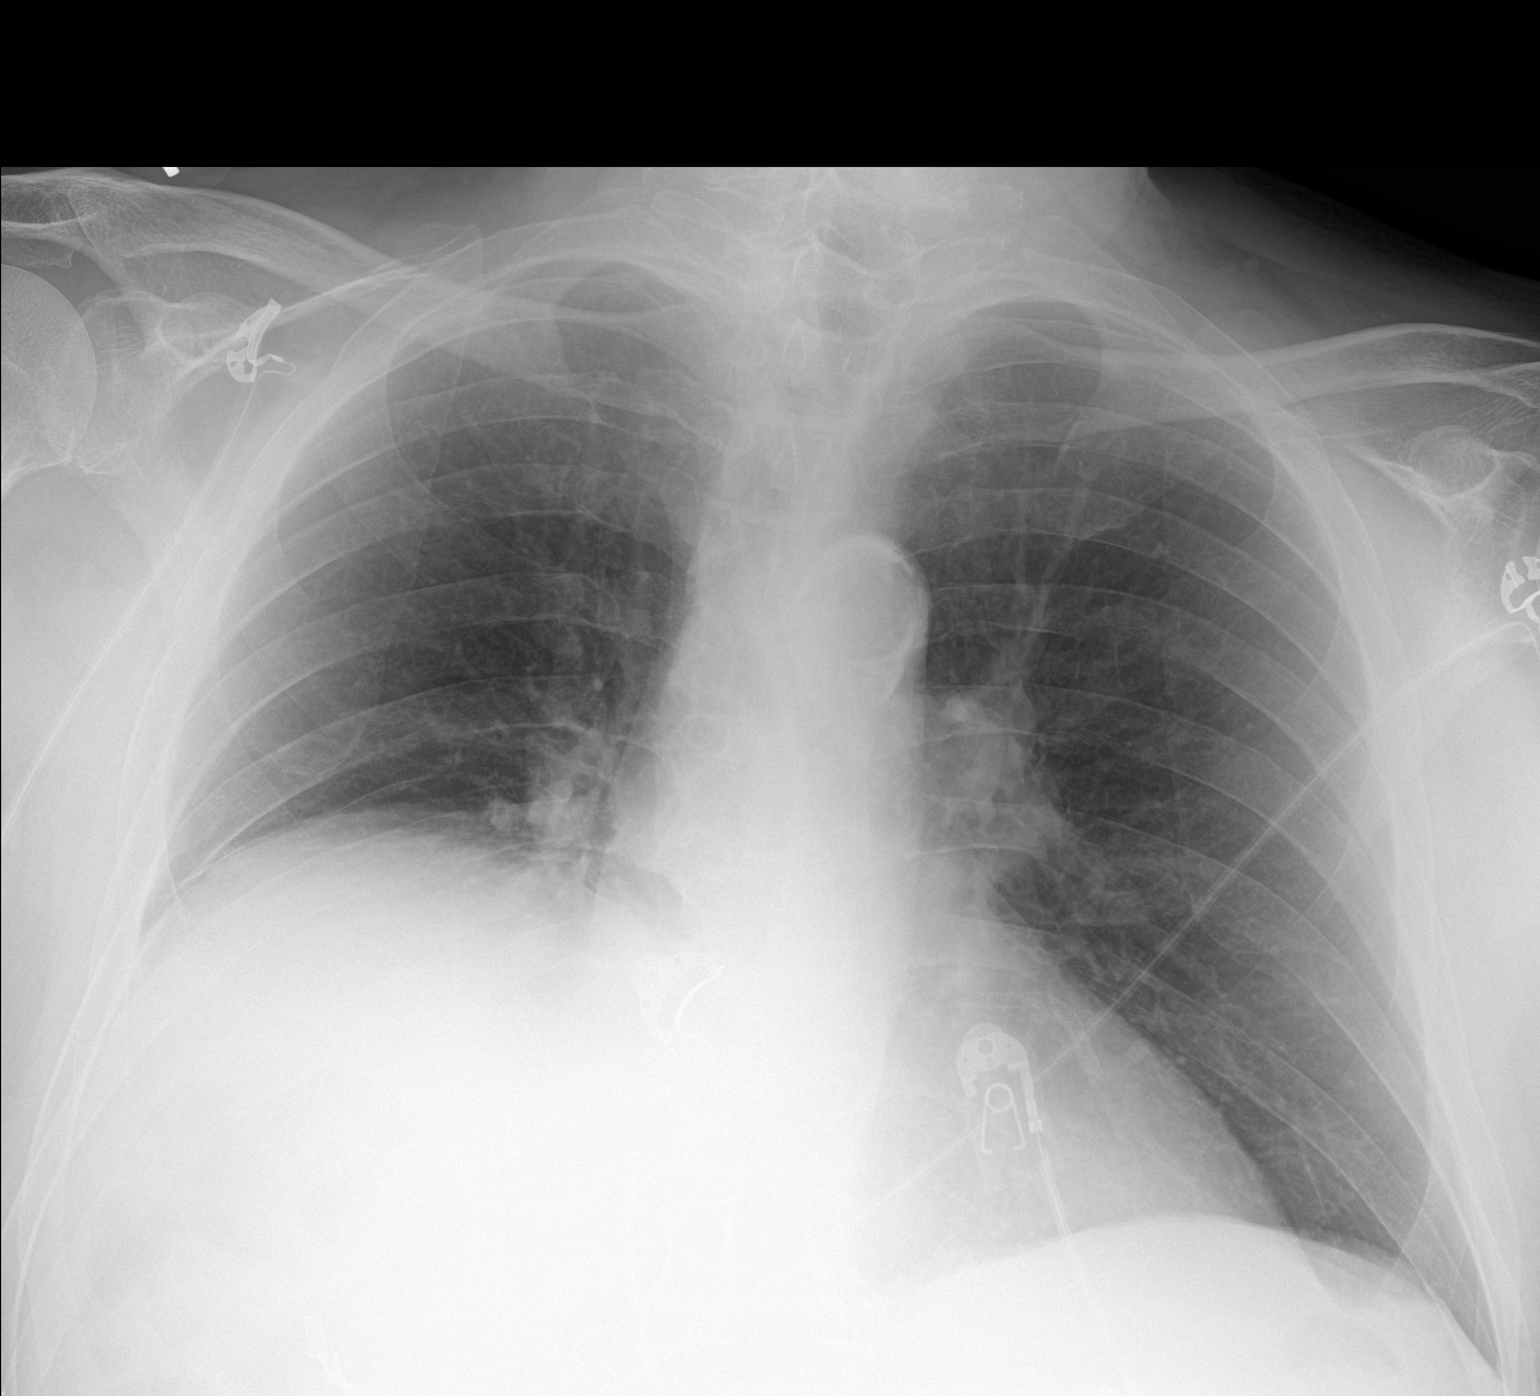

[1 of 1 positions shown; findings below may reference images not displayed]

FINDINGS: Cardiac shadow is stable. Aortic calcifications are again seen.
Elevation right hemidiaphragm is again noted. No focal infiltrate or
sizable effusion is seen. No bony abnormality is noted.
IMPRESSION: No active disease.

## 2018-08-28 DIAGNOSIS — M545 Low back pain: Secondary | ICD-10-CM | POA: Diagnosis not present

## 2018-08-28 DIAGNOSIS — M25569 Pain in unspecified knee: Secondary | ICD-10-CM | POA: Diagnosis not present

## 2018-08-28 DIAGNOSIS — M47817 Spondylosis without myelopathy or radiculopathy, lumbosacral region: Secondary | ICD-10-CM | POA: Diagnosis not present

## 2018-08-28 DIAGNOSIS — M170 Bilateral primary osteoarthritis of knee: Secondary | ICD-10-CM | POA: Diagnosis not present

## 2018-08-28 DIAGNOSIS — M1711 Unilateral primary osteoarthritis, right knee: Secondary | ICD-10-CM | POA: Diagnosis not present

## 2018-08-28 DIAGNOSIS — M1712 Unilateral primary osteoarthritis, left knee: Secondary | ICD-10-CM | POA: Diagnosis not present

## 2018-08-29 HISTORY — PX: MELANOMA EXCISION: SHX5266

## 2018-08-30 DIAGNOSIS — L0889 Other specified local infections of the skin and subcutaneous tissue: Secondary | ICD-10-CM | POA: Diagnosis not present

## 2018-08-30 DIAGNOSIS — Z4802 Encounter for removal of sutures: Secondary | ICD-10-CM | POA: Diagnosis not present

## 2018-09-03 DIAGNOSIS — L309 Dermatitis, unspecified: Secondary | ICD-10-CM | POA: Diagnosis not present

## 2018-09-03 DIAGNOSIS — Z85820 Personal history of malignant melanoma of skin: Secondary | ICD-10-CM | POA: Diagnosis not present

## 2018-09-03 DIAGNOSIS — Z85828 Personal history of other malignant neoplasm of skin: Secondary | ICD-10-CM | POA: Diagnosis not present

## 2018-09-06 DIAGNOSIS — M170 Bilateral primary osteoarthritis of knee: Secondary | ICD-10-CM | POA: Diagnosis not present

## 2018-09-07 ENCOUNTER — Other Ambulatory Visit: Payer: Self-pay | Admitting: Neurology

## 2018-09-07 NOTE — Progress Notes (Signed)
[name] was seen today in the movement disorders clinic for neurologic consultation at the request of Via, Lennette Bihari, MD.  This patient is accompanied in the office by his partner who supplements the history.  The consultation is for the evaluation of gait abnormality and to r/o PD.  The records that were made available to me were reviewed.  12/16/16 update:  Patient is seen today in follow-up for newly diagnosed Parkinson's disease.  This patient is accompanied in the office by his spouse who supplements the history.  Patient was started on levodopa last visit.  He states that he is doing better.  His tremor is better.  Lip tremor has resolved.  No SE.  He is taking carbidopa/levodopa 25/100 at 7am/11am/4pm.    No falls.    I did refer him to Parkinson's rehabilitation, but he opted to hold on that until he finishes cardiac rehabilitation.   Sees Dr. Nadyne Coombes.  04/27/17 update: Patient is seen today in follow-up.  He remains on carbidopa/levodopa 25/100, 1 tablet 3 times per day.  He is doing well on this, without side effects.  No hallucinations.  He has been admitted to the hospital twice since last visit.  The first was at the end of November, 2018 due to chest pain, which was felt likely supply demand mismatch due to atrial flutter.  He returned to the emergency room in December, 2018 and was admitted for persistent atrial flutter with variable tachycardia.  Diltiazem was added in addition to his metoprolol.  He is now having dizziness since the medication was started.  They have a f/u with cardiology after our visit today (Dr. Virgina Jock).  No falls.  No hallucinations.  He gets up in the AM and does 30 min on the bike and then does strength and cardio for 30 min.    08/29/17 update: Patient is seen today in follow-up for Parkinson's disease. This patient is accompanied in the office by his spouse who supplements the history. Patient is on carbidopa/levodopa 25/100, 1 tablet 3 times per day.   No wearing off but he cannot tell if it is working either.  Pt denies falls.  Pt denies lightheadedness, near syncope.  No hallucinations.  Mood has been good. Does 30 min on home exercise bike daily.   Had hematuria last week and saw urology.  Pt states that he was told a "blood vessel erupted on his prostate."  cardizem d/c after our last visit and it helped dizziness.  01/19/18 update: Patient is seen today in follow-up for Parkinson's disease, accompanied by his wife who supplements history.  Patient is on carbidopa/levodopa, 2 tablets in the morning, 2 in the afternoon and 1 in the evening.  This was an increase last visit.  Patient reports that he is a lot better.  Tremor is better and walking is better.  No falls since our last visit.  No lightheadedness or near syncope.  No hallucinations.  Patient is exercising on the home exercise bike (just getting back to it after hospitalization).  Records are reviewed since our last visit.  The patient was hospitalized for a GI bleed at the end of July/beginning of August.  He had bleeding internal hemmorhoids.  He is feeling better but his energy is not fully back to normal.  His PCP started him on gabapentin 300 mg at night for feet/nerve discomfort and that has helped.  Only started it 3 days ago.  09/11/18 update: Patient seen today in follow-up for  Parkinson's disease.  Patient is on carbidopa/levodopa 25/100, 2 tablets in the morning, 2 in the afternoon and 1 in the evening.  Pt denies falls.  Pt denies lightheadedness, near syncope.  No hallucinations.  Mood has been good.  Seeing Dr. Ronnald Ramp for dermatology.  Asked about some paresthesias in the feet and legs and states that he has neuropathy.  Primary care prescribed gabapentin, 300 mg twice per day and it does seem to help.  Sometimes he has no burning paresthesias and other times the feet seem to burn a lot at night.  Biggest issue is really knee pain.  He is having a lot of trouble walking.  He is seeing  Dr. Delilah Shan at [facility] and just had some gel injections into his knee.  PREVIOUS MEDICATIONS: none to date  ALLERGIES:   Allergies  Allergen Reactions   Nsaids Other (See Comments)    Cannot have any of these because he is currently taking Eliquis    CURRENT MEDICATIONS:  Outpatient Encounter Medications as of 09/11/2018  Medication Sig   apixaban (ELIQUIS) 2.5 MG TABS tablet Take 2.5 mg by mouth 2 (two) times daily. (0800 & 2000)   carbidopa-levodopa (SINEMET IR) 25-100 MG tablet TAKE 2 TABLETS AT 7AM, TAKE 2 TABLETS AT 11AM AND TAKE 1 TABLET AT 4PM   Cholecalciferol (VITAMIN D-3) 1000 units CAPS Take 1,000 Units by mouth daily. (0800)   furosemide (LASIX) 80 MG tablet TAKE 2 TABLETS IN THE MORNING AND TAKE 1 TABLET IN THE EVENING   gabapentin (NEURONTIN) 300 MG capsule Take 300 mg by mouth 2 (two) times daily. 22:30    Multiple Vitamins-Minerals (PRESERVISION AREDS 2 PO) Take 1 tablet by mouth 2 (two) times daily. (0800 & 2000)   nitroGLYCERIN (NITROSTAT) 0.4 MG SL tablet Place 0.4 mg under the tongue every 5 (five) minutes as needed for chest pain.   polyethylene glycol (MIRALAX / GLYCOLAX) packet Take 17 g by mouth 2 (two) times daily.   rosuvastatin (CRESTOR) 10 MG tablet Take 10 mg by mouth daily at 8 pm. (2000)   [DISCONTINUED] amLODipine (NORVASC) 5 MG tablet Take 5 mg by mouth daily. (0800)   albuterol (PROVENTIL HFA;VENTOLIN HFA) 108 (90 Base) MCG/ACT inhaler Inhale 2 puffs into the lungs every 4 (four) hours as needed for wheezing or shortness of breath.   [DISCONTINUED] carbidopa-levodopa (SINEMET IR) 25-100 MG tablet 2 at 7am/2 at 11am/1 at 4pm (Patient taking differently: Take 1-2 tablets by mouth See admin instructions. Take 2 tablets by mouth at 0700,  take 2 tablets by mouth at 11 am,  & take 1 tablet by mouth at 8pm)   [DISCONTINUED] furosemide (LASIX) 40 MG tablet 2 in am, 1 in afternoon (Patient not taking: Reported on 09/11/2018)   No  facility-administered encounter medications on file as of 09/11/2018.     PAST MEDICAL HISTORY:   Past Medical History:  Diagnosis Date   Arthritis    "knees; lower back" (08/09/2016)   Atrial fibrillation ([place])    remote hx/notes 08/07/2016   Coronary artery disease    Episodes of trembling    "most of the time it's my left hand" (08/09/2016)   GI bleeding 08/2017   High cholesterol    History of kidney stones    Hypertension    Melanoma of shoulder ([place]) 2000s   "left"   Mitral regurgitation and mitral stenosis    hx/notes 08/07/2016   Moderate aortic stenosis    hx/notes 08/07/2016   Parkinson's disease ([place])  Prostate cancer ([place]) 2011   hx/notes 08/07/2016    PAST SURGICAL HISTORY:   Past Surgical History:  Procedure Laterality Date   [place] [address] [place] [address]   CARDIOVERSION N/A 01/26/2017   Procedure: CARDIOVERSION;  Surgeon: [name], MD;  Location: [facility];  Service: Cardiovascular;  Laterality: N/A;   CARDIOVERSION N/A 03/06/2018   Procedure: CARDIOVERSION;  Surgeon: [name], MD;  Location: [facility];  Service: Cardiovascular;  Laterality: N/A;   CATARACT EXTRACTION W/ INTRAOCULAR LENS  IMPLANT, BILATERAL Bilateral 2014-02/2016   left-right; hx/notes 08/07/2016   COLONOSCOPY WITH PROPOFOL N/A 09/25/2017   Procedure: COLONOSCOPY WITH PROPOFOL;  Surgeon: Laurence Spates, MD;  Location: [facility];  Service: Endoscopy;  Laterality: N/A;   CORONARY ANGIOPLASTY WITH STENT PLACEMENT  08/09/2016   CORONARY ATHERECTOMY N/A 08/09/2016   Procedure: Coronary Atherectomy;  Surgeon: Adrian Prows, MD;  Location: [place];  Service: Cardiovascular;  Laterality: N/A;   CORONARY STENT INTERVENTION N/A 08/09/2016   Procedure: Coronary Stent Intervention;  Surgeon: Adrian Prows, MD;  Location: [facility];  Service: Cardiovascular;  Laterality: N/A;  LAD   EYE SURGERY Bilateral 05/2016   "laser on  both lenses"   HEMORRHOID SURGERY N/A 09/27/2017   Procedure: HEMORRHOIDECTOMY;  Surgeon: Erroll Luna, MD;  Location: [facility];  Service: General;  Laterality: N/A;   INGUINAL HERNIA REPAIR Left [employer]  06/25/2009   INTRAVASCULAR PRESSURE WIRE/FFR STUDY N/A 08/09/2016   Procedure: Intravascular Pressure Wire/FFR Study;  Surgeon: Adrian Prows, MD;  Location: [facility];  Service: Cardiovascular;  Laterality: N/A;   [place] Left 1990s   "shoulder"   MELANOMA EXCISION Right 08/2018   right shoulder   RIGHT HEART CATH N/A 03/20/2018   Procedure: RIGHT HEART CATH;  Surgeon: [name], MD;  Location: [place] [place] [facility];  Service: Cardiovascular;  Laterality: N/A;   RIGHT/LEFT HEART CATH AND CORONARY ANGIOGRAPHY N/A 08/09/2016   Procedure: Right/Left Heart Cath and Coronary Angiography;  Surgeon: Adrian Prows, MD;  Location: [facility];  Service: Cardiovascular;  Laterality: N/A;   TEE WITHOUT CARDIOVERSION N/A 01/25/2017   Procedure: TRANSESOPHAGEAL ECHOCARDIOGRAM (TEE);  Surgeon: [name], MD;  Location: [facility];  Service: Cardiovascular;  Laterality: N/A;    SOCIAL HISTORY:   Social History   Socioeconomic History   Marital status: Soil scientist    Spouse name: Not on file   Number of children: 1   Years of education: Not on file   Highest education level: GED or equivalent  Occupational History   Occupation: retired    Comment: food business - Engineer, drilling strain: Not on file   Food insecurity    Worry: Not on file    Inability: Not on Lexicographer needs    Medical: Not on file    Non-medical: Not on file  Tobacco Use   Smoking status: Never Smoker   Smokeless tobacco: Never Used   Tobacco comment: "smoked  2 months when I was 20; non since" (08/09/2016)  Substance and Sexual Activity   Alcohol  use: Yes    Alcohol/week: 14.0 standard drinks    Types: 14 Shots of liquor per week    Comment: 1drinks per day (canadian mist)   Drug use: No   Sexual activity: Not Currently  Lifestyle   Physical activity  Days per week: Not on file    Minutes per session: Not on file   Stress: Not on file  Relationships   Social connections    Talks on phone: Not on file    Gets together: Not on file    Attends religious service: Not on file    Active member of club or organization: Not on file    Attends meetings of clubs or organizations: Not on file    Relationship status: Not on file   Intimate partner violence    Fear of current or ex partner: Not on file    Emotionally abused: Not on file    Physically abused: Not on file    Forced sexual activity: Not on file  Other Topics Concern   Not on file  Social History Narrative   Retired Hotel manager with [employer]    FAMILY HISTORY:   Family Status  Relation Name Status   Mother  Deceased   Father  Deceased   Sister  Deceased   Brother  Alive   Daughter  Alive    ROS:  Review of Systems  HENT: Negative.   Eyes: Negative.   Respiratory: Negative.   Cardiovascular: Negative.   Gastrointestinal: Negative.   Genitourinary: Negative.   Musculoskeletal: Positive for back pain and joint pain (knee pain bilaterally - just had injections).  Skin: Negative.      PHYSICAL EXAMINATION:    VITALS:   Vitals:   09/11/18 1511  BP: (!) 151/76  Pulse: 87  Temp: 97.6 F (36.4 C)  SpO2: 100%  Weight: 213 lb 9.6 oz (96.9 kg)  Height: 5\' 8"  (1.727 m)    GEN:  The patient appears stated age and is in NAD. HEENT:  Normocephalic, atraumatic.  The mucous membranes are moist. The superficial temporal arteries are without ropiness or tenderness. CV:  irreg irreg Lungs:  CTAB Neck/HEME:  There are no carotid bruits bilaterally. MS:  Legs are edematous  Neurological examination:  Orientation: The patient is alert and oriented  x3. Cranial nerves: There is good facial symmetry. The speech is fluent and clear. Soft palate rises symmetrically and there is no tongue deviation. Hearing is intact to conversational tone. Sensation: Sensation is intact to light touch throughout Motor: Strength is 5/5 in the bilateral upper and lower extremities.   Shoulder shrug is equal and symmetric.  There is no pronator drift.  Movement examination: Tone: There is mild to mod increased tone in the UE (due for medication) Abnormal movements: There is no tremor noted Coordination:  There is mild decremation with hand opening and closing and finger taps, left greater than right. Gait and Station: it takes 2 attempts to arise out of the chair.  He is antalgic (reports because of the knees)   Labs:  Lab Results  Component Value Date   WBC 3.7 (L) 04/12/2018   HGB 9.6 (L) 04/12/2018   HCT 32.9 (L) 04/12/2018   MCV 83.7 04/12/2018   PLT 165 04/12/2018     Chemistry      Component Value Date/Time   NA 146 (H) 07/20/2018 1204   K 3.8 07/20/2018 1204   CL 97 07/20/2018 1204   CO2 21 07/20/2018 1204   BUN 35 (H) 07/20/2018 1204   CREATININE 1.67 (H) 07/20/2018 1204      Component Value Date/Time   CALCIUM 9.4 07/20/2018 1204   ALKPHOS 171 (H) 04/12/2018 1642   AST 26 04/12/2018 1642   ALT 5 04/12/2018 1642  BILITOT 2.9 (H) 04/12/2018 1642       Lab Results  Component Value Date   TSH 2.94 09/15/2016     ASSESSMENT/PLAN:  1.  Idiopathic Parkinson's disease.  The patient has tremor, bradykinesia, rigidity and postural instability.  -We discussed the diagnosis as well as pathophysiology of the disease.  We discussed treatment options as well as prognostic indicators.  Patient education was provided.  -increase carbidopa/levodopa 25/100, 2 po tid. discussed the value of exercise, but it is difficult for him to exercise, primarily because of knee pain.  -following with dermatology.  Sees Dr. Ronnald Ramp 8/6.  Discussed  relationship of melanoma to Parkinson's disease.  2. dizziness  -improved after cardizem d/c  3.  Feet pain and paresthesias.  -Being prescribed gabapentin by primary care.  On gabapentin, 300 mg twice per day.  Asks me about increasing this.  I told him I would leave this to the discretion of his primary care provider, but in the face of renal insufficiency, I would be very careful about increasing it, as it can cause myoclonus.  In addition, he really does not have a lot of paresthesias consistently, but sometimes he has a lot of burning in the feet at night.  I would recommend him trying over-the-counter lidocaine patches, such as Aspercreme lidocaine patch.  He is going to do that.  4.  Feet pain  -was started on gabapentin by PCP.  No objection but will need to monitor as has renal insufficiency (or did in the hospital) and gabapentin can cause myoclonus.  Pt only on low dose so likely okay.  5. Follow up is anticipated in the next 4-6 months, sooner should new neurologic issues arise.  Much greater than 50% of this visit was spent in counseling and coordinating care.  Total face to face time:  25 min     Cc:  Hulan Fess, MD

## 2018-09-11 ENCOUNTER — Ambulatory Visit (INDEPENDENT_AMBULATORY_CARE_PROVIDER_SITE_OTHER): Payer: Medicare Other | Admitting: Neurology

## 2018-09-11 ENCOUNTER — Other Ambulatory Visit: Payer: Self-pay

## 2018-09-11 ENCOUNTER — Encounter: Payer: Self-pay | Admitting: Neurology

## 2018-09-11 VITALS — BP 151/76 | HR 87 | Temp 97.6°F | Ht 68.0 in | Wt 213.6 lb

## 2018-09-11 DIAGNOSIS — I2510 Atherosclerotic heart disease of native coronary artery without angina pectoris: Secondary | ICD-10-CM | POA: Diagnosis not present

## 2018-09-11 DIAGNOSIS — G5793 Unspecified mononeuropathy of bilateral lower limbs: Secondary | ICD-10-CM | POA: Diagnosis not present

## 2018-09-11 DIAGNOSIS — G20 Parkinson's disease: Secondary | ICD-10-CM

## 2018-09-11 NOTE — Patient Instructions (Addendum)
1.  Increase carbidopa/levodopa 25/100 to 2 tablets at 7am, 2 tablets at 11am, 2 tablets at 4pm  2.  You can try over the counter lidocaine patches (aspercreme makes one) and put it on the feet at night.  You can put them on for 12 hours and take them off for 12 hours  The physicians and staff at [facility] are committed to providing excellent care. You may receive a survey requesting feedback about your experience at our office. We strive to receive "very good" responses to the survey questions. If you feel that your experience would prevent you from giving the office a "very good " response, please contact our office to try to remedy the situation. We may be reached at [phone]. Thank you for taking the time out of your busy day to complete the survey.

## 2018-09-11 NOTE — Telephone Encounter (Signed)
Requested Prescriptions   Pending Prescriptions Disp Refills  . carbidopa-levodopa (SINEMET IR) 25-100 MG tablet [Pharmacy Med Name: CARBIDOPA/LEVODOPA 25-100 MG Tablet] 450 tablet     Sig: TAKE 2 TABLETS AT 7AM, TAKE 2 TABLETS AT 11AM AND TAKE 1 TABLET AT 4PM   Rx last filled: 01/19/18 #540 1 refills  Pt last seen:01/19/18  Follow up appt scheduled:today

## 2018-09-13 DIAGNOSIS — M170 Bilateral primary osteoarthritis of knee: Secondary | ICD-10-CM | POA: Diagnosis not present

## 2018-09-20 DIAGNOSIS — M170 Bilateral primary osteoarthritis of knee: Secondary | ICD-10-CM | POA: Diagnosis not present

## 2018-10-04 DIAGNOSIS — C44729 Squamous cell carcinoma of skin of left lower limb, including hip: Secondary | ICD-10-CM | POA: Diagnosis not present

## 2018-10-04 DIAGNOSIS — Z23 Encounter for immunization: Secondary | ICD-10-CM | POA: Diagnosis not present

## 2018-10-04 DIAGNOSIS — G20 Parkinson's disease: Secondary | ICD-10-CM | POA: Diagnosis not present

## 2018-10-04 DIAGNOSIS — I2510 Atherosclerotic heart disease of native coronary artery without angina pectoris: Secondary | ICD-10-CM | POA: Diagnosis not present

## 2018-10-04 DIAGNOSIS — Z85820 Personal history of malignant melanoma of skin: Secondary | ICD-10-CM | POA: Diagnosis not present

## 2018-10-04 DIAGNOSIS — Z8546 Personal history of malignant neoplasm of prostate: Secondary | ICD-10-CM | POA: Diagnosis not present

## 2018-10-04 DIAGNOSIS — R600 Localized edema: Secondary | ICD-10-CM | POA: Diagnosis not present

## 2018-10-04 DIAGNOSIS — E782 Mixed hyperlipidemia: Secondary | ICD-10-CM | POA: Diagnosis not present

## 2018-10-04 DIAGNOSIS — N189 Chronic kidney disease, unspecified: Secondary | ICD-10-CM | POA: Diagnosis not present

## 2018-10-04 DIAGNOSIS — I10 Essential (primary) hypertension: Secondary | ICD-10-CM | POA: Diagnosis not present

## 2018-10-04 DIAGNOSIS — D485 Neoplasm of uncertain behavior of skin: Secondary | ICD-10-CM | POA: Diagnosis not present

## 2018-10-04 DIAGNOSIS — Z85828 Personal history of other malignant neoplasm of skin: Secondary | ICD-10-CM | POA: Diagnosis not present

## 2018-10-04 DIAGNOSIS — N529 Male erectile dysfunction, unspecified: Secondary | ICD-10-CM | POA: Diagnosis not present

## 2018-10-04 DIAGNOSIS — D509 Iron deficiency anemia, unspecified: Secondary | ICD-10-CM | POA: Diagnosis not present

## 2018-10-04 DIAGNOSIS — G629 Polyneuropathy, unspecified: Secondary | ICD-10-CM | POA: Diagnosis not present

## 2018-10-04 DIAGNOSIS — I4891 Unspecified atrial fibrillation: Secondary | ICD-10-CM | POA: Diagnosis not present

## 2018-10-15 ENCOUNTER — Other Ambulatory Visit: Payer: Self-pay

## 2018-10-15 DIAGNOSIS — Z20822 Contact with and (suspected) exposure to covid-19: Secondary | ICD-10-CM

## 2018-10-17 LAB — NOVEL CORONAVIRUS, NAA: SARS-CoV-2, NAA: NOT DETECTED

## 2018-10-22 DIAGNOSIS — N183 Chronic kidney disease, stage 3 (moderate): Secondary | ICD-10-CM | POA: Diagnosis not present

## 2018-10-29 DIAGNOSIS — E876 Hypokalemia: Secondary | ICD-10-CM | POA: Diagnosis not present

## 2018-10-31 DIAGNOSIS — E876 Hypokalemia: Secondary | ICD-10-CM | POA: Diagnosis not present

## 2018-10-31 DIAGNOSIS — I50810 Right heart failure, unspecified: Secondary | ICD-10-CM | POA: Diagnosis not present

## 2018-10-31 DIAGNOSIS — I129 Hypertensive chronic kidney disease with stage 1 through stage 4 chronic kidney disease, or unspecified chronic kidney disease: Secondary | ICD-10-CM | POA: Diagnosis not present

## 2018-10-31 DIAGNOSIS — Z23 Encounter for immunization: Secondary | ICD-10-CM | POA: Diagnosis not present

## 2018-10-31 DIAGNOSIS — N183 Chronic kidney disease, stage 3 (moderate): Secondary | ICD-10-CM | POA: Diagnosis not present

## 2018-11-01 DIAGNOSIS — Z85828 Personal history of other malignant neoplasm of skin: Secondary | ICD-10-CM | POA: Diagnosis not present

## 2018-11-01 DIAGNOSIS — Z85820 Personal history of malignant melanoma of skin: Secondary | ICD-10-CM | POA: Diagnosis not present

## 2018-11-01 DIAGNOSIS — L570 Actinic keratosis: Secondary | ICD-10-CM | POA: Diagnosis not present

## 2018-11-11 NOTE — Progress Notes (Signed)
Patient is here for follow up visit.  Subjective:   @Patient  ID: [name], male    DOB: [birth year], 83 y.o.   MRN: [id number]   Chief Complaint  Patient presents with  . Hypertension  . Follow-up    3 month    HPI  83 year old Caucasian male with coronary artery disease status (LAD PCI 2018), now permanentl atrial fibrillation, complex valvular heart disease, mod AI, mod TR, moderate pulmonary hypertension, venous insufficiency, Parkinsons disease, hemorrhoidal GI bleed in 08/2017  Patient is doing well with no significant worsening of his leg edema or exertional dyspnea.  He has regular follow-up with nephrology, and saw Dr. Joelyn Oms last week.  Potassium was added.  I do not have the labs available.  Blood pressure is reasonably well-controlled on his home monitoring checks.  Mildly elevated today.  In 07/2018, patient shared with me, his advance care directive.  He does not want to be on any life prolonging measures, if he is deemed to have dementia, vegetative state.  However, he does not mind undergoing resuscitation, as medically necessary.  Past Medical History:  Diagnosis Date  . Arthritis    "knees; lower back" (08/09/2016)  . Atrial fibrillation ([place])    remote hx/notes 08/07/2016  . Coronary artery disease   . Episodes of trembling    "most of the time it's my left hand" (08/09/2016)  . GI bleeding 08/2017  . High cholesterol   . History of kidney stones   . Hypertension   . Melanoma of shoulder ([place]) 2000s   "left"  . Mitral regurgitation and mitral stenosis    hx/notes 08/07/2016  . Moderate aortic stenosis    hx/notes 08/07/2016  . Parkinson's disease ([place])   . Prostate cancer ([place]) 2011   hx/notes 08/07/2016     Past Surgical History:  Procedure Laterality Date  . CARDIAC CATHETERIZATION  ~ [address], [place]"  . CARDIOVERSION N/A 01/26/2017   Procedure: CARDIOVERSION;  Surgeon: [name], MD;  Location: [facility];   Service: Cardiovascular;  Laterality: N/A;  . CARDIOVERSION N/A 03/06/2018   Procedure: CARDIOVERSION;  Surgeon: [name], MD;  Location: [facility];  Service: Cardiovascular;  Laterality: N/A;  . CATARACT EXTRACTION W/ INTRAOCULAR LENS  IMPLANT, BILATERAL Bilateral 2014-02/2016   left-right; hx/notes 08/07/2016  . COLONOSCOPY WITH PROPOFOL N/A 09/25/2017   Procedure: COLONOSCOPY WITH PROPOFOL;  Surgeon: Laurence Spates, MD;  Location: [facility];  Service: Endoscopy;  Laterality: N/A;  . CORONARY ANGIOPLASTY WITH STENT PLACEMENT  08/09/2016  . CORONARY ATHERECTOMY N/A 08/09/2016   Procedure: Coronary Atherectomy;  Surgeon: Adrian Prows, MD;  Location: [facility];  Service: Cardiovascular;  Laterality: N/A;  . CORONARY STENT INTERVENTION N/A 08/09/2016   Procedure: Coronary Stent Intervention;  Surgeon: Adrian Prows, MD;  Location: [facility];  Service: Cardiovascular;  Laterality: N/A;  LAD  . EYE SURGERY Bilateral 05/2016   "laser on both lenses"  . HEMORRHOID SURGERY N/A 09/27/2017   Procedure: HEMORRHOIDECTOMY;  Surgeon: Erroll Luna, MD;  Location: [facility];  Service: General;  Laterality: N/A;  . INGUINAL HERNIA REPAIR Left 1991  . INSERTION PROSTATE RADIATION SEED  06/25/2009  . INTRAVASCULAR PRESSURE WIRE/FFR STUDY N/A 08/09/2016   Procedure: Intravascular Pressure Wire/FFR Study;  Surgeon: Adrian Prows, MD;  Location: [facility];  Service: Cardiovascular;  Laterality: N/A;  . LAPAROSCOPIC CHOLECYSTECTOMY  1993  . MELANOMA EXCISION Left 1990s   "shoulder"  . MELANOMA EXCISION Right  08/2018   right shoulder  . RIGHT HEART CATH N/A 03/20/2018   Procedure: RIGHT HEART CATH;  Surgeon: [name], MD;  Location: [facility];  Service: Cardiovascular;  Laterality: N/A;  . RIGHT/LEFT HEART CATH AND CORONARY ANGIOGRAPHY N/A 08/09/2016   Procedure: Right/Left Heart Cath and Coronary Angiography;  Surgeon: Adrian Prows, MD;  Location: [facility];   Service: Cardiovascular;  Laterality: N/A;  . TEE WITHOUT CARDIOVERSION N/A 01/25/2017   Procedure: TRANSESOPHAGEAL ECHOCARDIOGRAM (TEE);  Surgeon: [name], MD;  Location: [facility];  Service: Cardiovascular;  Laterality: N/A;     Social History   Socioeconomic History  . Marital status: Soil scientist    Spouse name: Not on file  . Number of children: 1  . Years of education: Not on file  . Highest education level: GED or equivalent  Occupational History  . 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 Financial resource strain: Not on file  . Food insecurity    Worry: Not on file    Inability: Not on file  . Transportation needs    Medical: Not on file    Non-medical: Not on file  Tobacco Use  . Smoking status: Never Smoker  . Smokeless tobacco: Never Used  . Tobacco comment: "smoked  2 months when I was 20; non since" (08/09/2016)  Substance and Sexual Activity  . Alcohol use: Yes    Alcohol/week: 14.0 standard drinks    Types: 14 Shots of liquor per week    Comment: 1drinks per day (canadian mist)  . Drug use: No  . Sexual activity: Not Currently  Lifestyle  . Physical activity    Days per week: Not on file    Minutes per session: Not on file  . Stress: Not on file  Relationships  . Social Herbalist on phone: Not on file    Gets together: Not on file    Attends religious service: Not on file    Active member of club or organization: Not on file    Attends meetings of clubs or organizations: Not on file    Relationship status: Not on file  . Intimate partner violence    Fear of current or ex partner: Not on file    Emotionally abused: Not on file    Physically abused: Not on file    Forced sexual activity: Not on file  Other Topics Concern  . Not on file  Social History Narrative   Retired Hotel manager with [employer]     Current Outpatient Medications on File Prior to Visit  Medication Sig Dispense  Refill  . albuterol (PROVENTIL HFA;VENTOLIN HFA) 108 (90 Base) MCG/ACT inhaler Inhale 2 puffs into the lungs every 4 (four) hours as needed for wheezing or shortness of breath. 1 Inhaler 0  . apixaban (ELIQUIS) 2.5 MG TABS tablet Take 2.5 mg by mouth 2 (two) times daily. (0800 & 2000)    . carbidopa-levodopa (SINEMET IR) 25-100 MG tablet TAKE 2 TABLETS AT 7AM, TAKE 2 TABLETS AT 11AM AND TAKE 1 TABLET AT 4PM 450 tablet 1  . Cholecalciferol (VITAMIN D-3) 1000 units CAPS Take 1,000 Units by mouth daily. (0800)    . furosemide (LASIX) 80 MG tablet TAKE 2 TABLETS IN THE MORNING AND TAKE 1 TABLET IN THE EVENING 270 tablet 1  . gabapentin (NEURONTIN) 300 MG capsule Take 300 mg by mouth 2 (two) times daily. 22:30     .  Multiple Vitamins-Minerals (PRESERVISION AREDS 2 PO) Take 1 tablet by mouth 2 (two) times daily. (0800 & 2000)    . nitroGLYCERIN (NITROSTAT) 0.4 MG SL tablet Place 0.4 mg under the tongue every 5 (five) minutes as needed for chest pain.    . polyethylene glycol (MIRALAX / GLYCOLAX) packet Take 17 g by mouth 2 (two) times daily. 14 each 0  . rosuvastatin (CRESTOR) 10 MG tablet Take 10 mg by mouth daily at 8 pm. (2000)     No current facility-administered medications on file prior to visit.     Cardiovascular studies:  EKG 11/12/2018: Atrial fibrillation 83 bpm. Nonspecific ST depression  -Nondiagnostic.   EKG 03/14/2018: Sinus rhythm 64 bpm. First degree AV block. Normal axis. IVCD. left atrial enlargement. Poor R wave profression.   [place] 03/20/2018: WHO Grp II pulmonary hypertension Mean PA 42 mmHg, PVR 2.4 WU Normal RV systolic function (PAPi 2.4) Recommendation: Continue medical management with aggressive diuresis.  Echocardiogram 01/02/2018: Left ventricle cavity is normal in size. Moderate concentric hypertrophy of the left ventricle. Low normal global wall motion. Visual EF is 50-55%. Unable to evaluate diastolic function due to A. Fibrillation. Calculated EF 51%. Left  atrial cavity is severely dilated. Right atrial cavity is moderately dilated. Trileaflet aortic valve with moderate aortic valve leaflet calcification. Trace aortic stenosis. Moderate (Grade II) aortic regurgitation. Moderate (Grade II) mitral regurgitation. Moderate tricuspid regurgitation. Mild pulmonary hypertension. Estimated pulmonary artery systolic pressure 65 mmHg. Compared to previous echocardiogram on 09/01/2016, there is interval increase in severity of aortic regurgitation, tricuspid regurgitation, and pulmonary hypertension.  Echocardiogram 01/02/2018: Left ventricle cavity is normal in size. Moderate concentric hypertrophy of the left ventricle. Low normal global wall motion. Visual EF is 50-55%. Unable to evaluate diastolic function due to A. Fibrillation. Calculated EF 51%. Left atrial cavity is severely dilated. Right atrial cavity is moderately dilated. Trileaflet aortic valve with moderate aortic valve leaflet calcification. Trace aortic stenosis. Moderate (Grade II) aortic regurgitation. Moderate (Grade II) mitral regurgitation. Moderate tricuspid regurgitation. Mild pulmonary hypertension. Estimated pulmonary artery systolic pressure 65 mmHg. Compared to previous echocardiogram on 09/01/2016, there is interval increase in severity of aortic regurgitation, tricuspid regurgitation, and pulmonary hypertension.  Event monitor 04/27/2017 - 05/26/2017: Persistent atrial fibrillation with controlled ventricular rate.  Two episodes of 3 beat nonsustained VT 4.6 second pause at 11:34 PM on 05/03/2017.  3.9 second pause at 7 AM on 05/14/2017. Symptoms not reported. Occasional episodes of atypical atrial flutter with variable conduction  Lower extremity venous duplex 04/07/2017: No evidence of deep vein thrombosis of the right lower extremity with normal venous return. No evidence of valvular incompetence (chronic venous insufficiency) of the right lower extremity.  No evidence  of deep vein thrombosis of the left lower extremity with normal venous return. Chronic valvular incompetence (chronic venous insufficiency) of the left lower extremity. Marked reflux noted in the left sapheno-femoral junction (2.5 s), great saphenous proximal thigh (2.6 s), great saphenous mid thigh (1.6 s), great saphenous distal thigh (3.9 s) and great saphenous proximal calf (3.4 s) veins.  Cardioversion 01/26/2017: S/p successful TEE guided cardioversion 100 JS/p successful TEE guided cardioversion 100 J  Coronary angiogram 08/09/2016: FFR guided CSI atherectomy followed by 3.0 x 26 and 3.0 x 18 mm onyx resolute DES to Mid LAD. Mild disease in Cx and RCA.  Lower Extremity Dopplers 06/2009: Pt has Baker Cyst behind both knees.   Recent labs: Results for LEVAN, SOOY "[name]" (MRN [id number]) as of 08/03/2018 08:09  Ref. Range  04/12/2018 16:42 04/12/2018 17:50 07/20/2018 0000000  BASIC METABOLIC PANEL Unknown   Rpt (A)  COMPREHENSIVE METABOLIC PANEL Unknown Rpt (A)    Sodium Latest Ref Range: 134 - 144 mmol/L 137  146 (H)  Potassium Latest Ref Range: 3.5 - 5.2 mmol/L 3.3 (L)  3.8  Chloride Latest Ref Range: 96 - 106 mmol/L 94 (L)  97  CO2 Latest Ref Range: 20 - 29 mmol/L 35 (H)  21  Glucose Latest Ref Range: 65 - 99 mg/dL 94  41 (L)  BUN Latest Ref Range: 8 - 27 mg/dL 27 (H)  35 (H)  Creatinine Latest Ref Range: 0.76 - 1.27 mg/dL 2.13 (H)  1.67 (H)  Calcium Latest Ref Range: 8.6 - 10.2 mg/dL 8.7 (L)  9.4  Anion gap Latest Ref Range: 5 - 15  8    BUN/Creatinine Ratio Latest Ref Range: 10 - 24    21  Alkaline Phosphatase Latest Ref Range: 38 - 126 U/L 171 (H)    Albumin Latest Ref Range: 3.5 - 5.0 g/dL 3.5    AST Latest Ref Range: 15 - 41 U/L 26    ALT Latest Ref Range: 0 - 44 U/L 5    Total Protein Latest Ref Range: 6.5 - 8.1 g/dL 7.4    Total Bilirubin Latest Ref Range: 0.3 - 1.2 mg/dL 2.9 (H)    GFR, Est Non African American Latest Ref Range: >59 mL/min/1.73 28 (L)  38 (L)  GFR, Est  African American Latest Ref Range: >59 mL/min/1.73 33 (L)  44 (L)  B Natriuretic Peptide Latest Ref Range: 0.0 - 100.0 pg/mL  388.7 (H)   Troponin I Latest Ref Range: <0.03 ng/mL  0.04 (HH)    01/02/2018: Lipids: Cholesterol 106, triglycerides 48, HDL 46, LDL 50.    Review of Systems  Constitution: Negative for decreased appetite, malaise/fatigue, weight gain and weight loss.  HENT: Negative for congestion.   Eyes: Negative for visual disturbance.  Cardiovascular: Positive for dyspnea on exertion (Significantly improved) and leg swelling (Significantly improved). Negative for chest pain, palpitations and syncope.  Respiratory: Negative for shortness of breath.   Endocrine: Negative for cold intolerance.  Hematologic/Lymphatic: Does not bruise/bleed easily.  Skin: Negative for itching and rash.  Musculoskeletal: Positive for joint pain (Bilateral knees). Negative for myalgias.  Gastrointestinal: Negative for abdominal pain, nausea and vomiting.  Genitourinary: Negative for dysuria.  Neurological: Negative for dizziness and weakness.  Psychiatric/Behavioral: The patient is not nervous/anxious.   All other systems reviewed and are negative.      Objective:    Vitals:   11/12/18 1128  BP: (!) 150/75  Pulse: 72  Temp: (!) 97.5 F (36.4 C)  SpO2: 98%   Filed Weights   11/12/18 1128  Weight: 214 lb (97.1 kg)      Physical Exam  Constitutional: He is oriented to person, place, and time. He appears well-developed and well-nourished. No distress.  HENT:  Head: Normocephalic and atraumatic.  Eyes: Pupils are equal, round, and reactive to light. Conjunctivae are normal.  Neck: Neck supple. No JVD present. No thyromegaly present.  Cardiovascular: Normal rate, intact distal pulses and normal pulses. An irregularly irregular rhythm present. Exam reveals no gallop, no S3 and no S4.  Murmur heard.  Midsystolic murmur is present with a grade of 3/6. Aortic Area S1 is variable, S2 is  normal.  Pulmonary/Chest: Effort normal and breath sounds normal. He has no wheezes. He has no rales.  Abdominal: Soft. Bowel sounds are normal. There is  no rebound.  Musculoskeletal: Normal range of motion.        General: Edema (Trace b/l. Chronic venous insufficiency changes. ) present.  Lymphadenopathy:    He has no cervical adenopathy.  Neurological: He is alert and oriented to person, place, and time. No cranial nerve deficit.  Skin: Skin is warm and dry.  Psychiatric: He has a normal mood and affect.  Nursing note and vitals reviewed.       Assessment & Recommendations:     83 year old Caucasian male with coronary artery disease status (LAD PCI 2018), now permanentl atrial fibrillation, complex valvular heart disease, mod AI, mod TR, moderate pulmonary hypertension, venous insufficiency, Parkinsons disease, hemorrhoidal GI bleed in 08/2017  Leg edema: Secondary to WHO Grp II pulmonary hypertension, mixed valvular disease, and venous insufficiency. Improved with lasix 80 mg tid for now.  He has venous insufficiency which is contributing to his leg edema, along with pulmonary hypertension.  He is not interested in endovenous ablation.  He is not compliant with compression stockings, I have encouraged him to keep his legs elevated at night.  Permanent atrial fibrilation: Rate controlled.  CHA2DS2VASc score 3: Annual stroke risk 3.2% Continue Eliquis 2.5 mg bid.   Valvular disease: Mod aortic, motral, tricuspid regurg, likely all due to atrial fibrilation. Rate control for Afib, as above.  Pulmonary hypertension: WHO Grp II pulmonary hypertension due to left sided heart diseease (HFpEF, valvular heart disease, Afib- RHC 02/2018 showed PCWP 27 mmHg). Recommend continued management of hypertension and diuresis.   Hypertension: Fairly well controlled on current therapy.  H/o GI bleed: S/p hemorrhoidectomy in 08/2017.Currently, no bleeding with stable Hb. Tolerating Eliquis  well.  CAD: Stable. Not on aspirin due to bleeding risk. Continue statin. Given his advanced age, renal dysfunction, recommend conservative medical management  Overall, no changes made to his medical management today, F/u in 6 months.   [name], MD [facility] Pager: [phone] Office: [phone] If no answer Cell [phone]

## 2018-11-12 ENCOUNTER — Encounter: Payer: Self-pay | Admitting: Cardiology

## 2018-11-12 ENCOUNTER — Ambulatory Visit (INDEPENDENT_AMBULATORY_CARE_PROVIDER_SITE_OTHER): Payer: Medicare Other | Admitting: Cardiology

## 2018-11-12 ENCOUNTER — Other Ambulatory Visit: Payer: Self-pay

## 2018-11-12 VITALS — BP 150/75 | HR 72 | Temp 97.5°F | Ht 68.0 in | Wt 214.0 lb

## 2018-11-12 DIAGNOSIS — I10 Essential (primary) hypertension: Secondary | ICD-10-CM

## 2018-11-12 DIAGNOSIS — R600 Localized edema: Secondary | ICD-10-CM | POA: Diagnosis not present

## 2018-11-12 DIAGNOSIS — I272 Pulmonary hypertension, unspecified: Secondary | ICD-10-CM

## 2018-11-12 DIAGNOSIS — I2510 Atherosclerotic heart disease of native coronary artery without angina pectoris: Secondary | ICD-10-CM | POA: Diagnosis not present

## 2018-11-12 DIAGNOSIS — I4821 Permanent atrial fibrillation: Secondary | ICD-10-CM

## 2018-11-14 ENCOUNTER — Other Ambulatory Visit: Payer: Self-pay | Admitting: Cardiology

## 2018-11-16 DIAGNOSIS — R3915 Urgency of urination: Secondary | ICD-10-CM | POA: Diagnosis not present

## 2018-11-16 DIAGNOSIS — N528 Other male erectile dysfunction: Secondary | ICD-10-CM | POA: Diagnosis not present

## 2018-11-16 DIAGNOSIS — C61 Malignant neoplasm of prostate: Secondary | ICD-10-CM | POA: Diagnosis not present

## 2018-11-21 ENCOUNTER — Other Ambulatory Visit: Payer: Self-pay

## 2018-12-20 ENCOUNTER — Telehealth: Payer: Self-pay | Admitting: Neurology

## 2018-12-20 NOTE — Telephone Encounter (Signed)
I would recommend they ask their PCP about this for the knee.  Its a little out of my realm of expertise.

## 2018-12-20 NOTE — Telephone Encounter (Signed)
Okay to do so? 

## 2018-12-20 NOTE — Telephone Encounter (Signed)
Patient's wife called wanting to know if it is okay for the patient to try over-the-counter CBD triple strength spray on his knee.

## 2018-12-20 NOTE — Telephone Encounter (Signed)
Spoke with patients wife, she will contact his PCP.

## 2019-01-04 ENCOUNTER — Other Ambulatory Visit: Payer: Self-pay | Admitting: Cardiology

## 2019-01-10 DIAGNOSIS — H35362 Drusen (degenerative) of macula, left eye: Secondary | ICD-10-CM | POA: Diagnosis not present

## 2019-01-13 ENCOUNTER — Other Ambulatory Visit: Payer: Self-pay | Admitting: Cardiology

## 2019-02-05 DIAGNOSIS — L570 Actinic keratosis: Secondary | ICD-10-CM | POA: Diagnosis not present

## 2019-02-05 DIAGNOSIS — L821 Other seborrheic keratosis: Secondary | ICD-10-CM | POA: Diagnosis not present

## 2019-02-05 DIAGNOSIS — I8312 Varicose veins of left lower extremity with inflammation: Secondary | ICD-10-CM | POA: Diagnosis not present

## 2019-02-05 DIAGNOSIS — L812 Freckles: Secondary | ICD-10-CM | POA: Diagnosis not present

## 2019-02-05 DIAGNOSIS — Z85828 Personal history of other malignant neoplasm of skin: Secondary | ICD-10-CM | POA: Diagnosis not present

## 2019-02-05 DIAGNOSIS — I872 Venous insufficiency (chronic) (peripheral): Secondary | ICD-10-CM | POA: Diagnosis not present

## 2019-02-05 DIAGNOSIS — I8311 Varicose veins of right lower extremity with inflammation: Secondary | ICD-10-CM | POA: Diagnosis not present

## 2019-02-05 DIAGNOSIS — L308 Other specified dermatitis: Secondary | ICD-10-CM | POA: Diagnosis not present

## 2019-02-05 DIAGNOSIS — Z85820 Personal history of malignant melanoma of skin: Secondary | ICD-10-CM | POA: Diagnosis not present

## 2019-02-12 ENCOUNTER — Ambulatory Visit: Payer: Medicare Other | Admitting: Neurology

## 2019-02-25 ENCOUNTER — Telehealth: Payer: Self-pay

## 2019-02-25 NOTE — Telephone Encounter (Signed)
Error

## 2019-03-14 ENCOUNTER — Other Ambulatory Visit: Payer: Self-pay | Admitting: Neurology

## 2019-04-09 ENCOUNTER — Telehealth: Payer: Self-pay

## 2019-04-09 NOTE — Telephone Encounter (Signed)
Ok. Please get the details from the patient of what dose and frequency he was on. I am okay to refill.  Thanks MJP

## 2019-04-09 NOTE — Telephone Encounter (Signed)
Pt wife called to inform us if you could refill pt inhaler (albuterol). You did not prescribe it the ER did Dr little, but pt wife asked if you could refill rx please

## 2019-05-13 ENCOUNTER — Other Ambulatory Visit: Payer: Self-pay

## 2019-05-13 ENCOUNTER — Ambulatory Visit: Payer: Medicare Other | Admitting: Cardiology

## 2019-05-13 ENCOUNTER — Encounter: Payer: Self-pay | Admitting: Cardiology

## 2019-05-13 VITALS — BP 170/87 | HR 85 | Temp 97.9°F | Resp 16 | Ht 68.0 in | Wt 219.0 lb

## 2019-05-13 DIAGNOSIS — I272 Pulmonary hypertension, unspecified: Secondary | ICD-10-CM

## 2019-05-13 DIAGNOSIS — I340 Nonrheumatic mitral (valve) insufficiency: Secondary | ICD-10-CM

## 2019-05-13 DIAGNOSIS — I2510 Atherosclerotic heart disease of native coronary artery without angina pectoris: Secondary | ICD-10-CM

## 2019-05-13 DIAGNOSIS — I4821 Permanent atrial fibrillation: Secondary | ICD-10-CM

## 2019-05-13 DIAGNOSIS — I10 Essential (primary) hypertension: Secondary | ICD-10-CM

## 2019-05-13 NOTE — Progress Notes (Signed)
Patient is here for follow up visit. Subjective:   _0  ID: [name], male    DOB: 10/17/1935, 84 y.o.   MRN: [id number]   Chief Complaint  Patient presents with  . Atrial Fibrillation  . Leg Swelling  . Follow-up    6 month    HPI  84 year old Caucasian male with coronary artery disease status (LAD PCI 2018), now permanentl atrial fibrillation, complex valvular heart disease, mod AI, mod TR, moderate pulmonary hypertension, venous insufficiency, Parkinsons disease, hemorrhoidal GI bleed in 08/2017.  Recent BMP in 09/2018 showed Cr improved to 1.49.  He continues to exercise on stationary bike for 15 minutes every day.  He has mild, unchanged exertional dyspnea.  He is using Lasix 80 mg 3 times daily, with relatively stable mild leg edema.  Blood pressure is elevated today.  He brought his home blood pressure log which shows much lower blood pressures at home.  In 07/2018, patient shared with me, his advance care directive.  He does not want to be on any life prolonging measures, if he is deemed to have dementia, vegetative state.  However, he does not mind undergoing resuscitation, as medically necessary.   Current Outpatient Medications on File Prior to Visit  Medication Sig Dispense Refill  . apixaban (ELIQUIS) 2.5 MG TABS tablet Take 2.5 mg by mouth 2 (two) times daily. (0800 & 2000)    . carbidopa-levodopa (SINEMET IR) 25-100 MG tablet TAKE 2 TABLETS AT 7AM, TAKE 2 TABLETS AT 11AM AND TAKE 1 TABLET AT 4PM 450 tablet 1  . Cholecalciferol (VITAMIN D-3) 1000 units CAPS Take 1,000 Units by mouth daily. (0800)    . gabapentin (NEURONTIN) 300 MG capsule Take 300 mg by mouth 2 (two) times daily. 22:30     . Multiple Vitamins-Minerals (PRESERVISION AREDS 2 PO) Take 1 tablet by mouth 2 (two) times daily. (0800 & 2000)    . polyethylene glycol (MIRALAX / GLYCOLAX) packet Take 17 g by mouth 2 (two) times daily. 14 each 0  . potassium chloride (KLOR-CON) 20 MEQ packet  Take by mouth 2 (two) times daily.    . rosuvastatin (CRESTOR) 10 MG tablet Take 10 mg by mouth daily at 8 pm. (2000)    . nitroGLYCERIN (NITROSTAT) 0.4 MG SL tablet TAKE 1 TABLET EVERY 5 MINUTES AS NEEDED FOR CHEST PAIN (Patient not taking: Reported on 05/13/2019) 25 tablet 0   No current facility-administered medications on file prior to visit.    Cardiovascular studies:  EKG 05/13/2019: Atrial fibrillation, controlled ventricular rate.  Right bundle beanch block.  [place] 03/20/2018: WHO Grp II pulmonary hypertension Mean PA 42 mmHg, PVR 2.4 WU Normal RV systolic function (PAPi 2.4) Recommendation: Continue medical management with aggressive diuresis.  Echocardiogram 01/02/2018: Left ventricle cavity is normal in size. Moderate concentric hypertrophy of the left ventricle. Low normal global wall motion. Visual EF is 50-55%. Unable to evaluate diastolic function due to A. Fibrillation. Calculated EF 51%. Left atrial cavity is severely dilated. Right atrial cavity is moderately dilated. Trileaflet aortic valve with moderate aortic valve leaflet calcification. Trace aortic stenosis. Moderate (Grade II) aortic regurgitation. Moderate (Grade II) mitral regurgitation. Moderate tricuspid regurgitation. Mild pulmonary hypertension. Estimated pulmonary artery systolic pressure 65 mmHg. Compared to previous echocardiogram on 09/01/2016, there is interval increase in severity of aortic regurgitation, tricuspid regurgitation, and pulmonary hypertension.  Event monitor 04/27/2017 - 05/26/2017: Persistent atrial fibrillation with controlled ventricular rate.  Two episodes of 3 beat nonsustained VT 4.6 second pause at  11:34 PM on 05/03/2017.  3.9 second pause at 7 AM on 05/14/2017. Symptoms not reported. Occasional episodes of atypical atrial flutter with variable conduction  Lower extremity venous duplex 04/07/2017: No evidence of deep vein thrombosis of the right lower extremity with normal  venous return. No evidence of valvular incompetence (chronic venous insufficiency) of the right lower extremity.  No evidence of deep vein thrombosis of the left lower extremity with normal venous return. Chronic valvular incompetence (chronic venous insufficiency) of the left lower extremity. Marked reflux noted in the left sapheno-femoral junction (2.5 s), great saphenous proximal thigh (2.6 s), great saphenous mid thigh (1.6 s), great saphenous distal thigh (3.9 s) and great saphenous proximal calf (3.4 s) veins.  Cardioversion 01/26/2017: S/p successful TEE guided cardioversion 100 JS/p successful TEE guided cardioversion 100 J  Coronary angiogram 08/09/2016: FFR guided CSI atherectomy followed by 3.0 x 26 and 3.0 x 18 mm onyx resolute DES to Mid LAD. Mild disease in Cx and RCA.  Lower Extremity Dopplers 06/2009: Pt has Baker Cyst behind both knees.   Recent labs: 10/04/2018: Glucose 91, BUN/Cr 21/1.49. EGFR 43.  HbA1C 5.4% Chol 143, TG 63, HDL 65, LDL 65 TSH 2.8 normal   01/02/2018: Lipids: Cholesterol 106, triglycerides 48, HDL 46, LDL 50.    Review of Systems  Eyes: Negative for visual disturbance.  Cardiovascular: Positive for dyspnea on exertion (Significantly improved) and leg swelling (Significantly improved). Negative for chest pain, palpitations and syncope.  Respiratory: Positive for shortness of breath.   Musculoskeletal: Positive for joint pain (Bilateral knees).  All other systems reviewed and are negative.      Objective:    Vitals:   05/13/19 1013 05/13/19 1027  BP: (!) 166/97 (!) 170/87  Pulse: 80 85  Resp: 16   Temp: 97.9 F (36.6 C)   SpO2: 98%    Filed Weights   05/13/19 1013  Weight: 219 lb (99.3 kg)      Physical Exam  Constitutional: No distress.  Neck: No JVD present.  Cardiovascular: Normal rate, intact distal pulses and normal pulses. An irregularly irregular rhythm present. Exam reveals no gallop, no S3 and no S4.  Murmur heard.   Midsystolic murmur is present with a grade of 3/6. Aortic Area S1 is variable, S2 is normal.  Pulmonary/Chest: Effort normal and breath sounds normal. He has no wheezes. He has no rales.  Musculoskeletal:        General: Edema (Trace b/l. Chronic venous insufficiency changes. ) present. Normal range of motion.     Cervical back: Neck supple.  Skin: Skin is warm and dry.  Psychiatric: He has a normal mood and affect.  Nursing note and vitals reviewed.       Assessment & Recommendations:     84 year old Caucasian male with coronary artery disease status (LAD PCI 2018), now permanentl atrial fibrillation, complex valvular heart disease, mod AI, mod TR, moderate pulmonary hypertension, venous insufficiency, Parkinsons disease, hemorrhoidal GI bleed in 08/2017  Leg edema: Secondary to WHO Grp II pulmonary hypertension, mixed valvular disease, and venous insufficiency. Improved with lasix 80 mg tid for now.  Leg edema is primarily nonpitting and suggests chronic skin changes of venous stasis.  With his renal function being tenuous, I would not add Bumex unless absolutely necessary.  At this time, continue Lasix 80 mg 3 times daily.  Recommend compression stockings and leg elevation at night.  Permanent atrial fibrilation: Rate controlled.  CHA2DS2VASc score 3: Annual stroke risk 3.2% Continue Eliquis 2.5 mg  bid.   Valvular disease: Mod aortic, motral, tricuspid regurg, likely all due to atrial fibrilation. Rate control for Afib, as above.  Pulmonary hypertension: WHO Grp II pulmonary hypertension due to left sided heart diseease (HFpEF, valvular heart disease, Afib- RHC 02/2018 showed PCWP 27 mmHg). Recommend continued management of hypertension and diuresis.   Hypertension: Suspect whitecoat hypertension.  Continue amlodipine 5 mg daily.  H/o GI bleed: S/p hemorrhoidectomy in 08/2017.Currently, no bleeding with stable Hb. Tolerating Eliquis well.  CAD: Stable. Not on aspirin due to  bleeding risk. Continue statin. Given his advanced age, renal dysfunction, recommend conservative medical management  Overall, no changes made to his medical management today, F/u in 6 months.  We will check BMP and NT proBNP before next visit.  [name], MD [facility] Pager: [phone] Office: [phone] If no answer Cell [phone]

## 2019-05-17 ENCOUNTER — Telehealth: Payer: Self-pay

## 2019-05-17 NOTE — Telephone Encounter (Signed)
Patient called and stated that his BP's have been 128-156 / 70-75 and he wants to know if he needs to get back on the metoprolol, because he remembered you telling him that if continued to go up he would need to get back on the Metoprolol. He said if you decide, yes, then he asked to send it in to the pharmacy on file (confirmed), because he doesn't have an old bottle, so he doesn't remember how to take it. Please advise.

## 2019-05-17 NOTE — Telephone Encounter (Signed)
Patient aware.

## 2019-05-17 NOTE — Telephone Encounter (Signed)
I would watch it a little longer. Other than 156, all other numbers are within normal limits.   Thanks MJP

## 2019-05-20 NOTE — Progress Notes (Signed)
Due to the COVID-19 crisis, this virtual check-in visit was done via telephone from my office and it was initiated and consent given by this patient and or family.   Telephone (Audio) Visit The purpose of this telephone visit is to provide medical care while limiting exposure to the novel coronavirus.    Consent was obtained for telephone visit and initiated by pt/family:  Yes.   Answered questions that patient had about telehealth interaction:  Yes.   I discussed the limitations, risks, security and privacy concerns of performing an evaluation and management service by telephone. I also discussed with the patient that there may be a patient responsible charge related to this service. The patient expressed understanding and agreed to proceed.  Pt location: Home Physician Location: home Name of referring provider:  Hulan Fess, MD I connected with .[name] at patients initiation/request on 05/22/2019 at  8:15 AM EDT by telephone and verified that I am speaking with the correct person using two identifiers.  Pt MRN:  [id number] Pt DOB:  [birth year]   History of Present Illness:  Patient seen today in follow-up for Parkinson's disease.  Last seen in July.  Last time I saw the patient, increase levodopa to 2 tablets 3 times per day.  Feels that he has been pretty stable.  Pt denies falls.  Has trouble getting  OOC but that is because of knee issue.  He is seeing Dr. Delilah Shan.  Taking gabapentin for neuropathy, managed by PCP.  Generally gabapentin helps but about 1-2 times per week, he has breakthrough pain.  He takes the medication at 6am and 11am.  Pt denies lightheadedness, near syncope.  No hallucinations.  Mood has been good.  Medical records have been reviewed since last visit, including those of cardiology.  Current Movement d/o meds: Carbidopa/levodopa 25/100, 2 tablets 3 times per day (increased last visit) - 7am/11am/4pm Gabapentin, 300 mg twice per day (prescribed and  managed by primary care physician)  Prior meds: none   Observations/Objective:   There were no vitals filed for this visit.  Assessment and Plan:   1.  Parkinson's disease  -Continue carbidopa/levodopa 25/100, 2 tablets 3 times per day  -Continue to follow with Dr. Ronnald Ramp for dermatology.  We discussed that it used to be thought that levodopa would increase risk of melanoma but now it is believed that Parkinsons itself likely increases risk of melanoma. he is to get regular skin checks.  2.  Feet pain and paresthesias  -Follows with primary care.  Primary care prescribes his gabapentin, 300 mg twice per day.  Pt having breakthrough pain about 1-2 times per week.  Told him to discuss with Little, Lennette Bihari, MD to see if reasonable to take an extra gabapentin prn 1-2 times per week  3.  Atrial fibrillation  -Follows with cardiology  4.  Renal insufficiency  -Follows with nephrology. Need for in person visit now:  No., will see in 5 months Follow Up Instructions:    -I discussed the assessment and treatment plan with the patient. The patient was provided an opportunity to ask questions and all were answered. The patient agreed with the plan and demonstrated an understanding of the instructions.   The patient was advised to call back or seek an in-person evaluation if the symptoms worsen or if the condition fails to improve as anticipated.    Total Time spent in visit with the patient was:  13 min, of which 100% of the time was spent in  counseling.   Pt understands and agrees with the plan of care outlined.     Alonza Bogus, DO

## 2019-05-21 ENCOUNTER — Encounter: Payer: Self-pay | Admitting: Neurology

## 2019-05-22 ENCOUNTER — Telehealth (INDEPENDENT_AMBULATORY_CARE_PROVIDER_SITE_OTHER): Payer: Medicare Other | Admitting: Neurology

## 2019-05-22 ENCOUNTER — Other Ambulatory Visit: Payer: Self-pay

## 2019-05-22 ENCOUNTER — Encounter: Payer: Self-pay | Admitting: Neurology

## 2019-05-22 VITALS — Ht 71.0 in | Wt 215.0 lb

## 2019-05-22 DIAGNOSIS — G20 Parkinson's disease: Secondary | ICD-10-CM | POA: Diagnosis not present

## 2019-05-22 MED ORDER — CARBIDOPA-LEVODOPA 25-100 MG PO TABS: 2.0000 | ORAL_TABLET | Freq: Three times a day (TID) | ORAL | 1 refills | Status: DC

## 2019-05-22 NOTE — Addendum Note (Signed)
Addended by: Ludwig Clarks on: 05/22/2019 08:39 AM   Modules accepted: Orders

## 2019-06-12 ENCOUNTER — Other Ambulatory Visit: Payer: Self-pay | Admitting: Cardiology

## 2019-10-10 ENCOUNTER — Telehealth: Payer: Self-pay

## 2019-10-10 NOTE — Telephone Encounter (Signed)
Okay to take up to 3-4 tablets of 325-650 mg/day.

## 2019-10-10 NOTE — Telephone Encounter (Signed)
How much a  day

## 2019-10-10 NOTE — Telephone Encounter (Signed)
Okay to take tylenol.

## 2019-10-11 LAB — BASIC METABOLIC PANEL
BUN/Creatinine Ratio: 13 (ref 10–24)
BUN: 18 mg/dL (ref 8–27)
CO2: 29 mmol/L (ref 20–29)
Calcium: 9.4 mg/dL (ref 8.6–10.2)
Chloride: 96 mmol/L (ref 96–106)
Creatinine, Ser: 1.37 mg/dL — ABNORMAL HIGH (ref 0.76–1.27)
GFR calc Af Amer: 56 mL/min/{1.73_m2} — ABNORMAL LOW (ref >59)
GFR calc non Af Amer: 48 mL/min/{1.73_m2} — ABNORMAL LOW (ref >59)
Glucose: 101 mg/dL — ABNORMAL HIGH (ref 65–99)
Potassium: 3.6 mmol/L (ref 3.5–5.2)
Sodium: 141 mmol/L (ref 134–144)

## 2019-10-11 NOTE — Telephone Encounter (Signed)
[name], patiens spouse is aware and agrees.   Leda Quail

## 2019-10-13 LAB — PRO B NATRIURETIC PEPTIDE: NT-Pro BNP: 1547 pg/mL — ABNORMAL HIGH (ref 0–486)

## 2019-10-13 LAB — SPECIMEN STATUS REPORT

## 2019-10-14 ENCOUNTER — Other Ambulatory Visit: Payer: Self-pay

## 2019-10-14 MED ORDER — AMLODIPINE BESYLATE 5 MG PO TABS: 5.0000 mg | ORAL_TABLET | Freq: Every day | ORAL | 1 refills | Status: DC

## 2019-10-18 ENCOUNTER — Encounter: Payer: Self-pay | Admitting: Cardiology

## 2019-10-18 ENCOUNTER — Other Ambulatory Visit: Payer: Self-pay

## 2019-10-18 ENCOUNTER — Ambulatory Visit: Payer: Medicare Other | Admitting: Cardiology

## 2019-10-18 VITALS — BP 152/91 | HR 92 | Resp 16 | Ht 71.0 in | Wt 201.2 lb

## 2019-10-18 DIAGNOSIS — I2510 Atherosclerotic heart disease of native coronary artery without angina pectoris: Secondary | ICD-10-CM

## 2019-10-18 DIAGNOSIS — I10 Essential (primary) hypertension: Secondary | ICD-10-CM

## 2019-10-18 DIAGNOSIS — R600 Localized edema: Secondary | ICD-10-CM

## 2019-10-18 DIAGNOSIS — I272 Pulmonary hypertension, unspecified: Secondary | ICD-10-CM

## 2019-10-18 DIAGNOSIS — I4821 Permanent atrial fibrillation: Secondary | ICD-10-CM

## 2019-10-18 MED ORDER — DILTIAZEM HCL ER COATED BEADS 120 MG PO CP24: 120.0000 mg | ORAL_CAPSULE | Freq: Every day | ORAL | 3 refills | Status: DC

## 2019-10-18 MED ORDER — METOLAZONE 2.5 MG PO TABS: 2.5000 mg | ORAL_TABLET | ORAL | 2 refills | Status: DC

## 2019-10-18 NOTE — Progress Notes (Signed)
Patient is here for follow up visit. Subjective:   _0  ID: [name], male    DOB: 11/05/1935, 84 y.o.   MRN: [id number]   Chief Complaint  Patient presents with  . Atrial Fibrillation  . Hypertension  . Follow-up    HPI  84 year old Caucasian male with coronary artery disease status (LAD PCI 2018), now permanentl atrial fibrillation, complex valvular heart disease, mod AI, mod TR, moderate pulmonary hypertension, venous insufficiency, Parkinsons disease, hemorrhoidal GI bleed in 08/2017.  Patient has been doing well, until recently, when he has developed more leg swelling.He is able to perform his daily exercise routine, which mostly involves repetitions mostly in sitting position. However, he notices shortness of breath with more than usual activity-such as walking faster and farther than usual. He denies any chest pain, presyncope, or syncope.   Recent labs reviewed with the patient.   Current Outpatient Medications on File Prior to Visit  Medication Sig Dispense Refill  . amLODipine (NORVASC) 5 MG tablet Take 1 tablet (5 mg total) by mouth daily. 90 tablet 1  . apixaban (ELIQUIS) 2.5 MG TABS tablet Take 2.5 mg by mouth 2 (two) times daily. (0800 & 2000)    . carbidopa-levodopa (SINEMET IR) 25-100 MG tablet Take 2 tablets by mouth 3 (three) times daily. 540 tablet 1  . Cholecalciferol (VITAMIN D-3) 1000 units CAPS Take 1,000 Units by mouth daily. (0800)    . furosemide (LASIX) 80 MG tablet TAKE 2 TABLETS IN THE MORNING  AND TAKE 1 TABLET IN THE EVENING 270 tablet 1  . gabapentin (NEURONTIN) 300 MG capsule Take 300 mg by mouth 2 (two) times daily. 22:30     . Multiple Vitamins-Minerals (PRESERVISION AREDS 2 PO) Take 1 tablet by mouth 2 (two) times daily. (0800 & 2000)    . nitroGLYCERIN (NITROSTAT) 0.4 MG SL tablet TAKE 1 TABLET EVERY 5 MINUTES AS NEEDED FOR CHEST PAIN 25 tablet 0  . polyethylene glycol (MIRALAX / GLYCOLAX) packet Take 17 g by mouth 2 (two)  times daily. (Patient taking differently: Take 17 g by mouth as needed. ) 14 each 0  . potassium chloride (KLOR-CON) 20 MEQ packet Take by mouth 2 (two) times daily.    . rosuvastatin (CRESTOR) 10 MG tablet Take 10 mg by mouth daily at 8 pm. (2000)     No current facility-administered medications on file prior to visit.    Cardiovascular studies:  EKG 05/13/2019: Atrial fibrillation, controlled ventricular rate.  Right bundle beanch block.  [place] 03/20/2018: WHO Grp II pulmonary hypertension Mean PA 42 mmHg, PVR 2.4 WU Normal RV systolic function (PAPi 2.4) Recommendation: Continue medical management with aggressive diuresis.  Echocardiogram 01/02/2018: Left ventricle cavity is normal in size. Moderate concentric hypertrophy of the left ventricle. Low normal global wall motion. Visual EF is 50-55%. Unable to evaluate diastolic function due to A. Fibrillation. Calculated EF 51%. Left atrial cavity is severely dilated. Right atrial cavity is moderately dilated. Trileaflet aortic valve with moderate aortic valve leaflet calcification. Trace aortic stenosis. Moderate (Grade II) aortic regurgitation. Moderate (Grade II) mitral regurgitation. Moderate tricuspid regurgitation. Mild pulmonary hypertension. Estimated pulmonary artery systolic pressure 65 mmHg. Compared to previous echocardiogram on 09/01/2016, there is interval increase in severity of aortic regurgitation, tricuspid regurgitation, and pulmonary hypertension.  Event monitor 04/27/2017 - 05/26/2017: Persistent atrial fibrillation with controlled ventricular rate.  Two episodes of 3 beat nonsustained VT 4.6 second pause at 11:34 PM on 05/03/2017.  3.9 second pause at 7 AM  on 05/14/2017. Symptoms not reported. Occasional episodes of atypical atrial flutter with variable conduction  Lower extremity venous duplex 04/07/2017: No evidence of deep vein thrombosis of the right lower extremity with normal venous return. No evidence  of valvular incompetence (chronic venous insufficiency) of the right lower extremity.  No evidence of deep vein thrombosis of the left lower extremity with normal venous return. Chronic valvular incompetence (chronic venous insufficiency) of the left lower extremity. Marked reflux noted in the left sapheno-femoral junction (2.5 s), great saphenous proximal thigh (2.6 s), great saphenous mid thigh (1.6 s), great saphenous distal thigh (3.9 s) and great saphenous proximal calf (3.4 s) veins.  Cardioversion 01/26/2017: S/p successful TEE guided cardioversion 100 JS/p successful TEE guided cardioversion 100 J  Coronary angiogram 08/09/2016: FFR guided CSI atherectomy followed by 3.0 x 26 and 3.0 x 18 mm onyx resolute DES to Mid LAD. Mild disease in Cx and RCA.  Lower Extremity Dopplers 06/2009: Pt has Baker Cyst behind both knees.   Recent labs: 10/10/2019: Glucose 101, BUN/Cr 18/1.37. EGFR 48. Na/K 141/3.6.  NT pro BNP 1547   10/04/2018: Glucose 91, BUN/Cr 21/1.49. EGFR 43.  HbA1C 5.4% Chol 143, TG 63, HDL 65, LDL 65 TSH 2.8 normal   01/02/2018: Cholesterol 106, triglycerides 48, HDL 46, LDL 50.    Review of Systems  Eyes: Negative for visual disturbance.  Cardiovascular: Positive for dyspnea on exertion (Significantly improved) and leg swelling (Significantly improved). Negative for chest pain, palpitations and syncope.  Respiratory: Positive for shortness of breath.   Musculoskeletal: Positive for joint pain (Bilateral knees).  All other systems reviewed and are negative.      Objective:    Vitals:   10/18/19 1420 10/18/19 1424  BP: (!) 156/86 (!) 152/91  Pulse: 91 92  Resp: 16   SpO2: 96% 97%   Filed Weights   10/18/19 1420  Weight: 201 lb 3.2 oz (91.3 kg)      Physical Exam Vitals and nursing note reviewed.  Constitutional:      General: He is not in acute distress. Neck:     Vascular: No JVD.  Cardiovascular:     Rate and Rhythm: Normal rate. Rhythm  irregularly irregular.     Pulses: Normal pulses and intact distal pulses.     Heart sounds: Murmur heard.  Midsystolic murmur is present with a grade of 3/6. Aortic Area   No gallop. No S3 or S4 sounds.      Comments: S1 is variable, S2 is normal. Pulmonary:     Effort: Pulmonary effort is normal.     Breath sounds: Normal breath sounds. No wheezing or rales.  Musculoskeletal:        General: Normal range of motion.     Cervical back: Neck supple.     Right lower leg: Edema (2+) present.     Left lower leg: Edema (2+) present.  Skin:    General: Skin is warm and dry.         Assessment & Recommendations:   84 year old Caucasian male with coronary artery disease status (LAD PCI 2018), now permanentl atrial fibrillation, complex valvular heart disease, mod AI, mod TR, moderate pulmonary hypertension, venous insufficiency, Parkinsons disease, hemorrhoidal GI bleed in 08/2017.  Leg edema: Recent increase in pro BNP. Secondary to WHO Grp II pulmonary hypertension, mixed valvular disease, and venous insufficiency, side effect of amlodipine. Recent increase. Continue lasix 80 mg tid. Added metolazone 2.5 mg three times/week for two weeks.  Permanent atrial fibrilation: Rate  controlled.  CHA2DS2VASc score 3: Annual stroke risk 3.2% Continue Eliquis 2.5 mg bid.  Switched amlodipine 5 mg to diltiazem 120 mg for rate control.   Valvular disease: Mod aortic, motral, tricuspid regurg, likely all due to atrial fibrilation. Rate control for Afib, as above.  Pulmonary hypertension: WHO Grp II pulmonary hypertension due to left sided heart diseease (HFpEF, valvular heart disease, Afib- RHC 02/2018 showed PCWP 27 mmHg). Recommend continued management of hypertension and diuresis.  Given that we have opted for conservative management for the above cardiac issues, I do not think repeat echocardiogram would necessarily change the management.   Hypertension: As above.  H/o GI bleed: S/p  hemorrhoidectomy in 08/2017.Currently, no bleeding with stable Hb. Tolerating Eliquis well.  CAD: Stable. Not on aspirin due to bleeding risk. Continue statin. Given his advanced age, renal dysfunction, recommend conservative medical management  Check BMP, pro BNP in 2 weeks. F/u in 3-4 weeks.  [name], MD [facility] Pager: [phone] Office: [phone] If no answer Cell [phone]

## 2019-10-20 ENCOUNTER — Encounter: Payer: Self-pay | Admitting: Cardiology

## 2019-10-22 ENCOUNTER — Telehealth: Payer: Self-pay

## 2019-10-22 NOTE — Telephone Encounter (Signed)
Pt wife called to inform us that pt had been feeling dizzy, with nausea and no appetite. Pt wife mention he has lost 13lb form the last visit from [place]. Pt wife wants to know should he stay on the fluid pill since he has lost 13lb with only two dose. Please advise. Pt is taking Diltiazem 120mg  and Metolazone 2.5

## 2019-10-22 NOTE — Telephone Encounter (Signed)
Metolazone was probably too strong for him even at lowest dose. Hold the metolazone, until visit with me. Recommend increased hydration, Keep [place] posted.   Thanks MJP

## 2019-10-23 NOTE — Telephone Encounter (Signed)
Called pt wife to inform her to hold the Metolazone until next visit. Pt wife Vaughan Basta understood

## 2019-10-31 ENCOUNTER — Ambulatory Visit: Payer: Medicare Other | Admitting: Neurology

## 2019-10-31 NOTE — Progress Notes (Signed)
Assessment/Plan:   1.  Parkinsons Disease  -Continue carbidopa/levodopa 25/100, 2 tablets 3 times per day  -Follows with Dr. Ronnald Ramp for dermatology.  Understands Parkinson's slightly increases risk for melanoma.  2.  Atrial fibrillation  -Follows with cardiology, on Eliquis  3.  Renal insufficiency  -Following with nephrology  4.  Feet pain/foot paresthesias  -was on gabapentin.  Pt feels better off of it than on it but asks about other meds.  Discussed cymbalta but decided to hold for now.  -talked about OTC lidocaine patches  Subjective:   [name] was seen today in follow up for Parkinsons disease.  My previous records were reviewed prior to todays visit as well as outside records available to me. Pt denies falls.  Pt had few issues of dizziness last week related to diuretics he was taking but no near syncope.  No hallucinations.  Mood has been good.  Was exercising until pulled a muscle in the neck when laying down at the dentist.  Hx of neck issues.  Saw cardiology on August 20 and those records are reviewed.  His amlodipine was switched to diltiazem for rate control.  Current prescribed movement disorder medications: Carbidopa/levodopa 25/100, 2 tablets 3 times per day (7am/11am/4pm)   PREVIOUS MEDICATIONS: gabapentin for PN  ALLERGIES:   Allergies  Allergen Reactions  . Nsaids Other (See Comments)    Cannot have any of these because he is currently taking Eliquis    CURRENT MEDICATIONS:  Outpatient Encounter Medications as of 11/01/2019  Medication Sig  . apixaban (ELIQUIS) 2.5 MG TABS tablet Take 2.5 mg by mouth 2 (two) times daily. (0800 & 2000)  . carbidopa-levodopa (SINEMET IR) 25-100 MG tablet Take 2 tablets by mouth 3 (three) times daily.  . Cholecalciferol (VITAMIN D-3) 1000 units CAPS Take 1,000 Units by mouth daily. (0800)  . diltiazem (CARDIZEM CD) 120 MG 24 hr capsule Take 1 capsule (120 mg total) by mouth daily.  [name] diltiazem (TIAZAC) 120 MG  24 hr capsule Take 120 mg by mouth daily.  . furosemide (LASIX) 80 MG tablet TAKE 2 TABLETS IN THE MORNING  AND TAKE 1 TABLET IN THE EVENING  . Multiple Vitamins-Minerals (PRESERVISION AREDS 2 PO) Take 1 tablet by mouth 2 (two) times daily. (0800 & 2000)  . nitroGLYCERIN (NITROSTAT) 0.4 MG SL tablet TAKE 1 TABLET EVERY 5 MINUTES AS NEEDED FOR CHEST PAIN  . polyethylene glycol (MIRALAX / GLYCOLAX) packet Take 17 g by mouth 2 (two) times daily. (Patient taking differently: Take 17 g by mouth as needed. )  . potassium chloride (KLOR-CON) 20 MEQ packet Take by mouth 2 (two) times daily.  . rosuvastatin (CRESTOR) 10 MG tablet Take 10 mg by mouth daily at 8 pm. (2000)  . gabapentin (NEURONTIN) 300 MG capsule Take 300 mg by mouth 2 (two) times daily. 22:30  (Patient not taking: Reported on 10/18/2019)  . metolazone (ZAROXOLYN) 2.5 MG tablet Take 1 tablet (2.5 mg total) by mouth as directed. Every other day for 2 weeks, starting 10/18/2019 (Patient not taking: Reported on 11/01/2019)   No facility-administered encounter medications on file as of 11/01/2019.    Objective:   PHYSICAL EXAMINATION:    VITALS:   Vitals:   11/01/19 1438  BP: 120/72  Pulse: 92  SpO2: 96%  Weight: 192 lb 12.8 oz (87.5 kg)  Height: 5\' 11"  (1.803 m)    GEN:  The patient appears stated age and is in NAD. HEENT:  Normocephalic, atraumatic.  The  mucous membranes are moist. The superficial temporal arteries are without ropiness or tenderness. CV:  RRR Lungs:  CTAB Neck/HEME:  There are no carotid bruits bilaterally.  Neurological examination:  Orientation: The patient is alert and oriented x3. Cranial nerves: There is good facial symmetry with facial hypomimia.  There is R exotropia.  The speech is fluent and clear. Soft palate rises symmetrically and there is no tongue deviation. Hearing is intact to conversational tone. Sensation: Sensation is intact to light touch throughout Motor: Strength is at least antigravity x4.   Movement examination: Tone: There is nl tone in the UE/LE Abnormal movements: mild head dyskinesia Coordination:  There is mild decremation with RAM's, Gait and Station: The patient has no difficulty arising out of a deep-seated chair without the use of the hands. The patient's stride length is slightly decreased but wide based.    I have reviewed and interpreted the following labs independently    Chemistry      Component Value Date/Time   NA 141 10/10/2019 1037   K 3.6 10/10/2019 1037   CL 96 10/10/2019 1037   CO2 29 10/10/2019 1037   BUN 18 10/10/2019 1037   CREATININE 1.37 (H) 10/10/2019 1037      Component Value Date/Time   CALCIUM 9.4 10/10/2019 1037   ALKPHOS 171 (H) 04/12/2018 1642   AST 26 04/12/2018 1642   ALT 5 04/12/2018 1642   BILITOT 2.9 (H) 04/12/2018 1642       Lab Results  Component Value Date   WBC 3.7 (L) 04/12/2018   HGB 9.6 (L) 04/12/2018   HCT 32.9 (L) 04/12/2018   MCV 83.7 04/12/2018   PLT 165 04/12/2018    Lab Results  Component Value Date   TSH 2.94 09/15/2016     Total time spent on today's visit was 30 minutes, including both face-to-face time and nonface-to-face time.  Time included that spent on review of records (prior notes available to me/labs/imaging if pertinent), discussing treatment and goals, answering patient's questions and coordinating care.  Cc:  Hulan Fess, MD

## 2019-11-01 ENCOUNTER — Other Ambulatory Visit: Payer: Self-pay

## 2019-11-01 ENCOUNTER — Encounter: Payer: Self-pay | Admitting: Neurology

## 2019-11-01 ENCOUNTER — Ambulatory Visit (INDEPENDENT_AMBULATORY_CARE_PROVIDER_SITE_OTHER): Payer: Medicare Other | Admitting: Neurology

## 2019-11-01 VITALS — BP 120/72 | HR 92 | Ht 71.0 in | Wt 192.8 lb

## 2019-11-01 DIAGNOSIS — G20 Parkinson's disease: Secondary | ICD-10-CM | POA: Diagnosis not present

## 2019-11-01 DIAGNOSIS — G5793 Unspecified mononeuropathy of bilateral lower limbs: Secondary | ICD-10-CM

## 2019-11-01 DIAGNOSIS — I2510 Atherosclerotic heart disease of native coronary artery without angina pectoris: Secondary | ICD-10-CM | POA: Diagnosis not present

## 2019-11-01 MED ORDER — CARBIDOPA-LEVODOPA 25-100 MG PO TABS
2.0000 | ORAL_TABLET | Freq: Three times a day (TID) | ORAL | 1 refills | Status: DC
Start: 2019-11-01 — End: 2020-05-08

## 2019-11-01 NOTE — Patient Instructions (Signed)
No changes in medications!  Keep exercising.  The physicians and staff at [facility] are committed to providing excellent care. You may receive a survey requesting feedback about your experience at our office. We strive to receive "very good" responses to the survey questions. If you feel that your experience would prevent you from giving the office a "very good " response, please contact our office to try to remedy the situation. We may be reached at [phone]. Thank you for taking the time out of your busy day to complete the survey.

## 2019-11-02 LAB — BASIC METABOLIC PANEL
BUN/Creatinine Ratio: 18 (ref 10–24)
BUN: 32 mg/dL — ABNORMAL HIGH (ref 8–27)
CO2: 30 mmol/L — ABNORMAL HIGH (ref 20–29)
Calcium: 9.5 mg/dL (ref 8.6–10.2)
Chloride: 93 mmol/L — ABNORMAL LOW (ref 96–106)
Creatinine, Ser: 1.78 mg/dL — ABNORMAL HIGH (ref 0.76–1.27)
GFR calc Af Amer: 40 mL/min/{1.73_m2} — ABNORMAL LOW (ref >59)
GFR calc non Af Amer: 35 mL/min/{1.73_m2} — ABNORMAL LOW (ref >59)
Glucose: 95 mg/dL (ref 65–99)
Potassium: 3.6 mmol/L (ref 3.5–5.2)
Sodium: 140 mmol/L (ref 134–144)

## 2019-11-02 LAB — PRO B NATRIURETIC PEPTIDE: NT-Pro BNP: 2160 pg/mL — ABNORMAL HIGH (ref 0–486)

## 2019-11-05 NOTE — Progress Notes (Signed)
Reviewed with he patient. He did not tolerate metolazone due to dizziness. Pro BNP and Cr both have increased, but he reports improvement in leg swelling on current dose of lasix 80 mg tid. Continue the same for now. Re-assess clinically next week and recheck labs.  [name], MD

## 2019-11-11 ENCOUNTER — Encounter: Payer: Self-pay | Admitting: Cardiology

## 2019-11-11 ENCOUNTER — Other Ambulatory Visit: Payer: Self-pay

## 2019-11-11 ENCOUNTER — Ambulatory Visit: Payer: Medicare Other | Admitting: Cardiology

## 2019-11-11 VITALS — BP 144/80 | HR 79 | Resp 16 | Ht 71.0 in | Wt 196.0 lb

## 2019-11-11 DIAGNOSIS — I10 Essential (primary) hypertension: Secondary | ICD-10-CM

## 2019-11-11 DIAGNOSIS — I272 Pulmonary hypertension, unspecified: Secondary | ICD-10-CM

## 2019-11-11 DIAGNOSIS — I4821 Permanent atrial fibrillation: Secondary | ICD-10-CM

## 2019-11-11 DIAGNOSIS — R600 Localized edema: Secondary | ICD-10-CM

## 2019-11-11 MED ORDER — DILTIAZEM HCL ER COATED BEADS 120 MG PO CP24: 120.0000 mg | ORAL_CAPSULE | Freq: Every day | ORAL | 3 refills | Status: DC

## 2019-11-11 NOTE — Progress Notes (Signed)
Patient is here for follow up visit. Subjective:   '@Patient'  ID: [name], male    DOB: May 20, 1935, 84 y.o.   MRN: [id number]   Chief Complaint  Patient presents with  . Atrial Fibrillation  . Leg Swelling  . Follow-up    HPI  84 year old Caucasian male with coronary artery disease status (LAD PCI 2018), now permanentl atrial fibrillation, complex valvular heart disease, mod AI, mod TR, moderate pulmonary hypertension, venous insufficiency, Parkinsons disease, hemorrhoidal GI bleed in 08/2017.  At last visit, I started metolazone due to increased BNP. However, he did not tolerate it due to dizziness, therefore I stopped it. Follow up labs in fact showed increased BNP and creatinine However, he reports that his leg edema and shortness of breath has improved.     Current Outpatient Medications on File Prior to Visit  Medication Sig Dispense Refill  . apixaban (ELIQUIS) 2.5 MG TABS tablet Take 2.5 mg by mouth 2 (two) times daily. (0800 & 2000)    . carbidopa-levodopa (SINEMET IR) 25-100 MG tablet Take 2 tablets by mouth 3 (three) times daily. 540 tablet 1  . Cholecalciferol (VITAMIN D-3) 1000 units CAPS Take 1,000 Units by mouth daily. (0800)    . diltiazem (CARDIZEM CD) 120 MG 24 hr capsule Take 1 capsule (120 mg total) by mouth daily. 30 capsule 3  . diltiazem (TIAZAC) 120 MG 24 hr capsule Take 120 mg by mouth daily.    . furosemide (LASIX) 80 MG tablet TAKE 2 TABLETS IN THE MORNING  AND TAKE 1 TABLET IN THE EVENING 270 tablet 1  . Multiple Vitamins-Minerals (PRESERVISION AREDS 2 PO) Take 1 tablet by mouth 2 (two) times daily. (0800 & 2000)    . nitroGLYCERIN (NITROSTAT) 0.4 MG SL tablet TAKE 1 TABLET EVERY 5 MINUTES AS NEEDED FOR CHEST PAIN 25 tablet 0  . polyethylene glycol (MIRALAX / GLYCOLAX) packet Take 17 g by mouth 2 (two) times daily. (Patient taking differently: Take 17 g by mouth as needed. ) 14 each 0  . potassium chloride (KLOR-CON) 20 MEQ packet Take by  mouth 2 (two) times daily.    . rosuvastatin (CRESTOR) 10 MG tablet Take 10 mg by mouth daily at 8 pm. (2000)     No current facility-administered medications on file prior to visit.    Cardiovascular studies:  EKG 05/13/2019: Atrial fibrillation, controlled ventricular rate.  Right bundle beanch block.  [place] 03/20/2018: WHO Grp II pulmonary hypertension Mean PA 42 mmHg, PVR 2.4 WU Normal RV systolic function (PAPi 2.4) Recommendation: Continue medical management with aggressive diuresis.  Echocardiogram 01/02/2018: Left ventricle cavity is normal in size. Moderate concentric hypertrophy of the left ventricle. Low normal global wall motion. Visual EF is 50-55%. Unable to evaluate diastolic function due to A. Fibrillation. Calculated EF 51%. Left atrial cavity is severely dilated. Right atrial cavity is moderately dilated. Trileaflet aortic valve with moderate aortic valve leaflet calcification. Trace aortic stenosis. Moderate (Grade II) aortic regurgitation. Moderate (Grade II) mitral regurgitation. Moderate tricuspid regurgitation. Mild pulmonary hypertension. Estimated pulmonary artery systolic pressure 65 mmHg. Compared to previous echocardiogram on 09/01/2016, there is interval increase in severity of aortic regurgitation, tricuspid regurgitation, and pulmonary hypertension.  Event monitor 04/27/2017 - 05/26/2017: Persistent atrial fibrillation with controlled ventricular rate.  Two episodes of 3 beat nonsustained VT 4.6 second pause at 11:34 PM on 05/03/2017.  3.9 second pause at 7 AM on 05/14/2017. Symptoms not reported. Occasional episodes of atypical atrial flutter with variable conduction  Lower extremity venous duplex 04/07/2017: No evidence of deep vein thrombosis of the right lower extremity with normal venous return. No evidence of valvular incompetence (chronic venous insufficiency) of the right lower extremity.  No evidence of deep vein thrombosis of the left  lower extremity with normal venous return. Chronic valvular incompetence (chronic venous insufficiency) of the left lower extremity. Marked reflux noted in the left sapheno-femoral junction (2.5 s), great saphenous proximal thigh (2.6 s), great saphenous mid thigh (1.6 s), great saphenous distal thigh (3.9 s) and great saphenous proximal calf (3.4 s) veins.  Cardioversion 01/26/2017: S/p successful TEE guided cardioversion 100 JS/p successful TEE guided cardioversion 100 J  Coronary angiogram 08/09/2016: FFR guided CSI atherectomy followed by 3.0 x 26 and 3.0 x 18 mm onyx resolute DES to Mid LAD. Mild disease in Cx and RCA.  Lower Extremity Dopplers 06/2009: Pt has Baker Cyst behind both knees.   Recent labs:  Results for [name], [name] (MRN [id number]) as of 11/11/2019 08:31  Ref. Range 10/10/2019 10:37 11/01/2019 13:21 11/01/2019 13:22  NT-Pro BNP Latest Ref Range: 0 - 486 pg/mL 1,547 (H)  2,160 (H)    10/10/2019: Glucose 101, BUN/Cr 18/1.37. EGFR 48. Na/K 141/3.6.  NT pro BNP 1547   10/04/2018: Glucose 91, BUN/Cr 21/1.49. EGFR 43.  HbA1C 5.4% Chol 143, TG 63, HDL 65, LDL 65 TSH 2.8 normal   01/02/2018: Cholesterol 106, triglycerides 48, HDL 46, LDL 50.    Review of Systems  Eyes: Negative for visual disturbance.  Cardiovascular: Positive for dyspnea on exertion (Significantly improved) and leg swelling (Significantly improved). Negative for chest pain, palpitations and syncope.  Respiratory: Positive for shortness of breath.   Musculoskeletal: Positive for joint pain (Bilateral knees).  All other systems reviewed and are negative.      Objective:    Vitals:   11/11/19 1123  BP: (!) 144/80  Pulse: 79  Resp: 16  SpO2: 97%   Filed Weights   11/11/19 1123  Weight: 196 lb (88.9 kg)      Physical Exam Vitals and nursing note reviewed.  Constitutional:      General: He is not in acute distress. Neck:     Vascular: No JVD.  Cardiovascular:     Rate and  Rhythm: Normal rate. Rhythm irregularly irregular.     Pulses: Normal pulses and intact distal pulses.     Heart sounds: Murmur heard.  Midsystolic murmur is present with a grade of 3/6. Aortic Area   No gallop. No S3 or S4 sounds.      Comments: S1 is variable, S2 is normal. Pulmonary:     Effort: Pulmonary effort is normal.     Breath sounds: Normal breath sounds. No wheezing or rales.  Musculoskeletal:        General: Normal range of motion.     Cervical back: Neck supple.     Right lower leg: Edema (2+) present.     Left lower leg: Edema (2+) present.  Skin:    General: Skin is warm and dry.         Assessment & Recommendations:   84 year old Caucasian male with coronary artery disease status (LAD PCI 2018), now permanentl atrial fibrillation, complex valvular heart disease, mod AI, mod TR, moderate pulmonary hypertension, venous insufficiency, Parkinsons disease, hemorrhoidal GI bleed in 08/2017.  Leg edema: Recent increase in pro BNP and creatinine, without any clinical worsening of leg edema, dyspnea symptoms. Secondary to WHO Grp II pulmonary hypertension, mixed valvular disease, and  venous insufficiency, side effect of amlodipine. Edema has, in fact,  Improved after stopping amlodipine. Continue lasix 80 mg tid.   Permanent atrial fibrilation: Rate controlled.  CHA2DS2VASc score 3: Annual stroke risk 3.2% Continue Eliquis 2.5 mg bid.  Continue diltiazem 120 mg for rate control.   Valvular disease: Mod aortic, motral, tricuspid regurg, likely all due to atrial fibrilation. Rate control for Afib, as above.  Pulmonary hypertension: WHO Grp II pulmonary hypertension due to left sided heart diseease (HFpEF, valvular heart disease, Afib- RHC 02/2018 showed PCWP 27 mmHg). Recommend continued management of hypertension and diuresis.  Given that we have opted for conservative management for the above cardiac issues, I do not think repeat echocardiogram would necessarily  change the management.   Hypertension: As above.  H/o GI bleed: S/p hemorrhoidectomy in 08/2017.Currently, no bleeding with stable Hb. Tolerating Eliquis well.  CAD: Stable. Not on aspirin due to bleeding risk. Continue statin. Given his advanced age, renal dysfunction, recommend conservative medical management  BMP and f/u in 3 months  Ritchie Klee Esther Hardy, MD [facility] [place] Pager: [phone] Office: [phone] If no answer Cell [phone]

## 2019-11-14 ENCOUNTER — Telehealth: Payer: Self-pay

## 2019-11-14 NOTE — Telephone Encounter (Signed)
Patient wife called stating that Dr. Joelyn Oms ([facility] - Nephrologist) started him on Farxiga and she is wanting to know if it will be ok to go ahead and start it, because they will not do anything until they have your approval. Please advise.

## 2019-11-15 NOTE — Telephone Encounter (Signed)
Spoke with the patient. Okay with me, as long as GFR is closely monitored.

## 2019-12-16 ENCOUNTER — Other Ambulatory Visit: Payer: Self-pay | Admitting: Neurology

## 2020-01-09 ENCOUNTER — Other Ambulatory Visit: Payer: Self-pay

## 2020-01-09 MED ORDER — FUROSEMIDE 80 MG PO TABS: 80.0000 mg | ORAL_TABLET | Freq: Two times a day (BID) | ORAL | 3 refills | Status: DC

## 2020-01-17 ENCOUNTER — Telehealth: Payer: Self-pay

## 2020-01-17 NOTE — Telephone Encounter (Signed)
Spoke to patient regarding his patient assistance pt is due to renew his application pt states nothing has came through the mail I told patient he can stop by for an application and we need his 2020 taxes

## 2020-01-17 NOTE — Telephone Encounter (Signed)
Received patient assistance form regarding Eliquis. Faxed paperwork to [facility] today. 01/17/20.

## 2020-02-07 ENCOUNTER — Other Ambulatory Visit: Payer: Self-pay

## 2020-02-07 MED ORDER — APIXABAN 2.5 MG PO TABS: 2.5000 mg | ORAL_TABLET | Freq: Two times a day (BID) | ORAL | 6 refills | Status: DC

## 2020-02-11 ENCOUNTER — Telehealth: Payer: Self-pay

## 2020-02-11 LAB — BASIC METABOLIC PANEL
BUN/Creatinine Ratio: 14 (ref 10–24)
BUN: 22 mg/dL (ref 8–27)
CO2: 26 mmol/L (ref 20–29)
Calcium: 9.5 mg/dL (ref 8.6–10.2)
Chloride: 95 mmol/L — ABNORMAL LOW (ref 96–106)
Creatinine, Ser: 1.54 mg/dL — ABNORMAL HIGH (ref 0.76–1.27)
GFR calc Af Amer: 48 mL/min/{1.73_m2} — ABNORMAL LOW (ref >59)
GFR calc non Af Amer: 41 mL/min/{1.73_m2} — ABNORMAL LOW (ref >59)
Glucose: 97 mg/dL (ref 65–99)
Potassium: 3.8 mmol/L (ref 3.5–5.2)
Sodium: 139 mmol/L (ref 134–144)

## 2020-02-11 NOTE — Telephone Encounter (Signed)
Telephone encounter:  Reason for call: Patients wife called and said she wants her husbands furosemide prescription fixed. She said he is taking 2 tabs in the morning and one at night. We have written down that he is suppose to be taking 1 tablet 2 times daily. So now she would like to know which one is correct.  Usual provider: Vernell Leep  Last office visit: 11/11/2019  Next office visit: NA  Last hospitalization: NA   Current Outpatient Medications on File Prior to Visit  Medication Sig Dispense Refill  . apixaban (ELIQUIS) 2.5 MG TABS tablet Take 1 tablet (2.5 mg total) by mouth 2 (two) times daily. (0800 & 2000) 60 tablet 6  . carbidopa-levodopa (SINEMET IR) 25-100 MG tablet Take 2 tablets by mouth 3 (three) times daily. 540 tablet 1  . Cholecalciferol (VITAMIN D-3) 1000 units CAPS Take 1,000 Units by mouth daily. (0800)    . diltiazem (CARDIZEM CD) 120 MG 24 hr capsule Take 1 capsule (120 mg total) by mouth daily. 90 capsule 3  . furosemide (LASIX) 80 MG tablet Take 1 tablet (80 mg total) by mouth 2 (two) times daily. 270 tablet 3  . Multiple Vitamins-Minerals (PRESERVISION AREDS 2 PO) Take 1 tablet by mouth 2 (two) times daily. (0800 & 2000)    . nitroGLYCERIN (NITROSTAT) 0.4 MG SL tablet TAKE 1 TABLET EVERY 5 MINUTES AS NEEDED FOR CHEST PAIN 25 tablet 0  . polyethylene glycol (MIRALAX / GLYCOLAX) packet Take 17 g by mouth 2 (two) times daily. (Patient taking differently: Take 17 g by mouth as needed. ) 14 each 0  . potassium chloride (KLOR-CON) 20 MEQ packet Take by mouth 2 (two) times daily.    . rosuvastatin (CRESTOR) 10 MG tablet Take 10 mg by mouth daily at 8 pm. (2000)     No current facility-administered medications on file prior to visit.

## 2020-02-12 ENCOUNTER — Other Ambulatory Visit: Payer: Self-pay

## 2020-02-12 ENCOUNTER — Ambulatory Visit: Payer: Medicare Other | Admitting: Cardiology

## 2020-02-12 ENCOUNTER — Telehealth: Payer: Self-pay

## 2020-02-12 MED ORDER — FUROSEMIDE 80 MG PO TABS
80.0000 mg | ORAL_TABLET | Freq: Three times a day (TID) | ORAL | 2 refills | Status: DC
Start: 2020-02-12 — End: 2020-02-19

## 2020-02-12 NOTE — Progress Notes (Signed)
Keep 2 in am , 1 in pm for now. Will discuss at next visit next week.

## 2020-02-12 NOTE — Telephone Encounter (Signed)
Patient wife called requesting a new script for patient's lasix who is taking TID to [place] refill was sent

## 2020-02-19 ENCOUNTER — Ambulatory Visit: Payer: Medicare Other | Admitting: Cardiology

## 2020-02-19 ENCOUNTER — Encounter: Payer: Self-pay | Admitting: Cardiology

## 2020-02-19 ENCOUNTER — Other Ambulatory Visit: Payer: Self-pay

## 2020-02-19 VITALS — BP 148/75 | HR 84 | Ht 71.0 in | Wt 194.0 lb

## 2020-02-19 DIAGNOSIS — I272 Pulmonary hypertension, unspecified: Secondary | ICD-10-CM

## 2020-02-19 DIAGNOSIS — I10 Essential (primary) hypertension: Secondary | ICD-10-CM

## 2020-02-19 DIAGNOSIS — R600 Localized edema: Secondary | ICD-10-CM

## 2020-02-19 DIAGNOSIS — I4821 Permanent atrial fibrillation: Secondary | ICD-10-CM

## 2020-02-19 MED ORDER — FUROSEMIDE 80 MG PO TABS: 80.0000 mg | ORAL_TABLET | ORAL | 3 refills | Status: DC

## 2020-02-19 NOTE — Progress Notes (Signed)
Patient is here for follow up visit. Subjective:   _0  ID: [name], male    DOB: [birth year], 84 y.o.   MRN: [id number]   Chief Complaint  Patient presents with  . Hypertension  . Edema  . Follow-up    3 month    HPI  84 year old Caucasian male with coronary artery disease status (LAD PCI 2018), now permanentl atrial fibrillation, complex valvular heart disease, mod AI, mod TR, moderate pulmonary hypertension, venous insufficiency, Parkinsons disease, hemorrhoidal GI bleed in 08/2017.  Patient is stable leg edema and exertional dyspnea.  He denies any chest pain.  He admits to not having pain wearing compression stockings regularly.  Reviewed recent labs (01/2020), that showed improved creatinine to 1.4.   Current Outpatient Medications on File Prior to Visit  Medication Sig Dispense Refill  . apixaban (ELIQUIS) 2.5 MG TABS tablet Take 1 tablet (2.5 mg total) by mouth 2 (two) times daily. (0800 & 2000) 60 tablet 6  . carbidopa-levodopa (SINEMET IR) 25-100 MG tablet Take 2 tablets by mouth 3 (three) times daily. 540 tablet 1  . Cholecalciferol (VITAMIN D-3) 1000 units CAPS Take 1,000 Units by mouth daily. (0800)    . diltiazem (CARDIZEM CD) 120 MG 24 hr capsule Take 1 capsule (120 mg total) by mouth daily. 90 capsule 3  . furosemide (LASIX) 80 MG tablet Take 1 tablet (80 mg total) by mouth 3 (three) times daily. 30 tablet 2  . Multiple Vitamins-Minerals (PRESERVISION AREDS 2 PO) Take 1 tablet by mouth 2 (two) times daily. (0800 & 2000)    . nitroGLYCERIN (NITROSTAT) 0.4 MG SL tablet TAKE 1 TABLET EVERY 5 MINUTES AS NEEDED FOR CHEST PAIN 25 tablet 0  . polyethylene glycol (MIRALAX / GLYCOLAX) packet Take 17 g by mouth 2 (two) times daily. (Patient taking differently: Take 17 g by mouth as needed. ) 14 each 0  . potassium chloride (KLOR-CON) 20 MEQ packet Take by mouth 2 (two) times daily.    . rosuvastatin (CRESTOR) 10 MG tablet Take 10 mg by mouth daily at 8  pm. (2000)     No current facility-administered medications on file prior to visit.    Cardiovascular studies:  EKG 05/13/2019: Atrial fibrillation, controlled ventricular rate.  Right bundle beanch block.  [place] 03/20/2018: WHO Grp II pulmonary hypertension Mean PA 42 mmHg, PVR 2.4 WU Normal RV systolic function (PAPi 2.4) Recommendation: Continue medical management with aggressive diuresis.  Echocardiogram 01/02/2018: Left ventricle cavity is normal in size. Moderate concentric hypertrophy of the left ventricle. Low normal global wall motion. Visual EF is 50-55%. Unable to evaluate diastolic function due to A. Fibrillation. Calculated EF 51%. Left atrial cavity is severely dilated. Right atrial cavity is moderately dilated. Trileaflet aortic valve with moderate aortic valve leaflet calcification. Trace aortic stenosis. Moderate (Grade II) aortic regurgitation. Moderate (Grade II) mitral regurgitation. Moderate tricuspid regurgitation. Mild pulmonary hypertension. Estimated pulmonary artery systolic pressure 65 mmHg. Compared to previous echocardiogram on 09/01/2016, there is interval increase in severity of aortic regurgitation, tricuspid regurgitation, and pulmonary hypertension.  Event monitor 04/27/2017 - 05/26/2017: Persistent atrial fibrillation with controlled ventricular rate.  Two episodes of 3 beat nonsustained VT 4.6 second pause at 11:34 PM on 05/03/2017.  3.9 second pause at 7 AM on 05/14/2017. Symptoms not reported. Occasional episodes of atypical atrial flutter with variable conduction  Lower extremity venous duplex 04/07/2017: No evidence of deep vein thrombosis of the right lower extremity with normal venous return. No evidence of valvular  incompetence (chronic venous insufficiency) of the right lower extremity.  No evidence of deep vein thrombosis of the left lower extremity with normal venous return. Chronic valvular incompetence (chronic venous insufficiency)  of the left lower extremity. Marked reflux noted in the left sapheno-femoral junction (2.5 s), great saphenous proximal thigh (2.6 s), great saphenous mid thigh (1.6 s), great saphenous distal thigh (3.9 s) and great saphenous proximal calf (3.4 s) veins.  Cardioversion 01/26/2017: S/p successful TEE guided cardioversion 100 JS/p successful TEE guided cardioversion 100 J  Coronary angiogram 08/09/2016: FFR guided CSI atherectomy followed by 3.0 x 26 and 3.0 x 18 mm onyx resolute DES to Mid LAD. Mild disease in Cx and RCA.  Lower Extremity Dopplers 06/2009: Pt has Baker Cyst behind both knees.   Recent labs: 02/10/2020: Glucose 97, BUN/Cr 22/1.54. EGFR 48. Na/K 139/3.8. Rest of the CMP normal  10/10/2019: Glucose 101, BUN/Cr 18/1.37. EGFR 48. Na/K 141/3.6.  NT pro BNP 1547   10/04/2018: Glucose 91, BUN/Cr 21/1.49. EGFR 43.  HbA1C 5.4% Chol 143, TG 63, HDL 65, LDL 65 TSH 2.8 normal   01/02/2018: Cholesterol 106, triglycerides 48, HDL 46, LDL 50.    Review of Systems  Eyes: Negative for visual disturbance.  Cardiovascular: Positive for dyspnea on exertion (Significantly improved) and leg swelling (Significantly improved). Negative for chest pain, palpitations and syncope.  Respiratory: Positive for shortness of breath.   Musculoskeletal: Positive for joint pain (Bilateral knees).  All other systems reviewed and are negative.      Objective:    Vitals:   02/19/20 1027  BP: (!) 148/75  Pulse: 84  SpO2: 96%   Filed Weights   02/19/20 1027  Weight: 194 lb (88 kg)      Physical Exam Vitals and nursing note reviewed.  Constitutional:      General: He is not in acute distress. Neck:     Vascular: No JVD.  Cardiovascular:     Rate and Rhythm: Normal rate. Rhythm irregularly irregular.     Pulses: Normal pulses and intact distal pulses.     Heart sounds: Murmur heard.   Midsystolic murmur is present with a grade of 3/6. Aortic Area  No gallop. No S3 or S4 sounds.       Comments: S1 is variable, S2 is normal. Pulmonary:     Effort: Pulmonary effort is normal.     Breath sounds: Normal breath sounds. No wheezing or rales.  Musculoskeletal:        General: Normal range of motion.     Cervical back: Neck supple.     Right lower leg: Edema (2+) present.     Left lower leg: Edema (2+) present.  Skin:    General: Skin is warm and dry.         Assessment & Recommendations:   84 year old Caucasian male with coronary artery disease status (LAD PCI 2018), now permanentl atrial fibrillation, complex valvular heart disease, mod AI, mod TR, moderate pulmonary hypertension, venous insufficiency, Parkinsons disease, hemorrhoidal GI bleed in 08/2017.  Leg edema: Multifactorial, including valvular heart disease,HFpEF, and resting insufficiency. Continue Lasix 80 mg, 2 tablets in a.m., and 1 tablet in p.m. Encouraged to wear compression stockings regularly.  Leg elevation when supine.    Permanent atrial fibrilation: Rate controlled. CHA2DS2VASc score 3: Annual stroke risk 3.2% Continue Eliquis 2.5 mg bid.  Continue diltiazem 120 mg for rate control.   Valvular disease: Mod aortic, motral, tricuspid regurg, likely all due to atrial fibrilation. Rate control for Afib,  as above.  Pulmonary hypertension: WHO Grp II pulmonary hypertension due to left sided heart diseease (HFpEF, valvular heart disease, Afib- RHC 02/2018 showed PCWP 27 mmHg). Recommend continued management of hypertension and diuresis.  Given that we have opted for conservative management for the above cardiac issues, I do not think repeat echocardiogram would necessarily change the management.    H/o GI bleed: S/p hemorrhoidectomy in 08/2017.Currently, no bleeding with stable Hb. Tolerating Eliquis well.  CAD: Stable. Not on aspirin due to bleeding risk. Continue statin. Given his advanced age, renal dysfunction, recommend conservative medical management  BMP and f/u in 6 months  Roselle Norton Esther Hardy, MD [facility]. [place] Pager: [phone] Office: [phone] If no answer Cell [phone]

## 2020-04-03 ENCOUNTER — Other Ambulatory Visit: Payer: Self-pay

## 2020-04-03 MED ORDER — APIXABAN 2.5 MG PO TABS: 2.5000 mg | ORAL_TABLET | Freq: Two times a day (BID) | ORAL | 6 refills | Status: DC

## 2020-04-09 DIAGNOSIS — D485 Neoplasm of uncertain behavior of skin: Secondary | ICD-10-CM | POA: Diagnosis not present

## 2020-04-09 DIAGNOSIS — L821 Other seborrheic keratosis: Secondary | ICD-10-CM | POA: Diagnosis not present

## 2020-04-09 DIAGNOSIS — L570 Actinic keratosis: Secondary | ICD-10-CM | POA: Diagnosis not present

## 2020-04-09 DIAGNOSIS — Z85828 Personal history of other malignant neoplasm of skin: Secondary | ICD-10-CM | POA: Diagnosis not present

## 2020-04-09 DIAGNOSIS — L820 Inflamed seborrheic keratosis: Secondary | ICD-10-CM | POA: Diagnosis not present

## 2020-04-09 DIAGNOSIS — L218 Other seborrheic dermatitis: Secondary | ICD-10-CM | POA: Diagnosis not present

## 2020-04-09 DIAGNOSIS — C44619 Basal cell carcinoma of skin of left upper limb, including shoulder: Secondary | ICD-10-CM | POA: Diagnosis not present

## 2020-04-09 DIAGNOSIS — C44629 Squamous cell carcinoma of skin of left upper limb, including shoulder: Secondary | ICD-10-CM | POA: Diagnosis not present

## 2020-04-09 DIAGNOSIS — C44699 Other specified malignant neoplasm of skin of left upper limb, including shoulder: Secondary | ICD-10-CM | POA: Diagnosis not present

## 2020-04-09 DIAGNOSIS — Z85820 Personal history of malignant melanoma of skin: Secondary | ICD-10-CM | POA: Diagnosis not present

## 2020-04-21 DIAGNOSIS — N183 Chronic kidney disease, stage 3 unspecified: Secondary | ICD-10-CM | POA: Diagnosis not present

## 2020-04-27 DIAGNOSIS — Z85820 Personal history of malignant melanoma of skin: Secondary | ICD-10-CM | POA: Diagnosis not present

## 2020-04-27 DIAGNOSIS — Z85828 Personal history of other malignant neoplasm of skin: Secondary | ICD-10-CM | POA: Diagnosis not present

## 2020-04-27 DIAGNOSIS — C44619 Basal cell carcinoma of skin of left upper limb, including shoulder: Secondary | ICD-10-CM | POA: Diagnosis not present

## 2020-04-30 DIAGNOSIS — E876 Hypokalemia: Secondary | ICD-10-CM | POA: Diagnosis not present

## 2020-04-30 DIAGNOSIS — N183 Chronic kidney disease, stage 3 unspecified: Secondary | ICD-10-CM | POA: Diagnosis not present

## 2020-04-30 DIAGNOSIS — I50810 Right heart failure, unspecified: Secondary | ICD-10-CM | POA: Diagnosis not present

## 2020-04-30 DIAGNOSIS — I129 Hypertensive chronic kidney disease with stage 1 through stage 4 chronic kidney disease, or unspecified chronic kidney disease: Secondary | ICD-10-CM | POA: Diagnosis not present

## 2020-05-04 NOTE — Progress Notes (Signed)
Assessment/Plan:   1.  Parkinsons Disease  -Continue carbidopa/levodopa 25/100, 2 tablets 3 times per day  -Follows with Dr. Ronnald Ramp for dermatology.  We discussed that it used to be thought that levodopa would increase risk of melanoma but now it is believed that Parkinsons itself likely increases risk of melanoma.   Just had BCC removed.  2.  Renal insufficiency  -Follows with nephrology  3.  Atrial fibrillation  -On Eliquis  -Follows with cardiology  4.  Feet pain paresthesias   -discussed options including increase gabapentin, trying cymbalta, changing to lyrica.  -pt would like to try to increase gabapentin.  He is currently on gabapentin 300 mg bid.  Will increase to 300 mg tid.   Subjective:   [name] was seen today in follow up for Parkinsons disease.  My previous records were reviewed prior to todays visit as well as outside records available to me. Pt fell off a ladder since last visit - didn't even have the ladder open and he fell on wife.  He was exercising until recently.  Pt denies lightheadedness, near syncope.  No hallucinations.  Mood has been good.  Just had BCC removed on L arm.  Asks about PN.  He went back on gabapentin - states that PCP knows pt went back on it.  He is taking 300 mg bid.    Current prescribed movement disorder medications: Carbidopa/levodopa 25/100, 2 tablets at 7 AM/11 AM/4 PM   PREVIOUS MEDICATIONS: Gabapentin for peripheral neuropathy  ALLERGIES:   Allergies  Allergen Reactions  . Nsaids Other (See Comments)    Cannot have any of these because he is currently taking Eliquis    CURRENT MEDICATIONS:  Outpatient Encounter Medications as of 05/05/2020  Medication Sig  . apixaban (ELIQUIS) 2.5 MG TABS tablet Take 1 tablet (2.5 mg total) by mouth 2 (two) times daily. (0800 & 2000)  . carbidopa-levodopa (SINEMET IR) 25-100 MG tablet Take 2 tablets by mouth 3 (three) times daily.  . Cholecalciferol (VITAMIN D-3) 1000 units  CAPS Take 1,000 Units by mouth daily. (0800)  . dapagliflozin propanediol (FARXIGA) 10 MG TABS tablet Take 1 tablet by mouth daily.  [name] diltiazem (CARDIZEM CD) 120 MG 24 hr capsule Take 1 capsule (120 mg total) by mouth daily.  . furosemide (LASIX) 80 MG tablet Take 1 tablet (80 mg total) by mouth as directed. 2 pills in morning, 1 pill in afternoon  . metolazone (ZAROXOLYN) 2.5 MG tablet Take 2.5 mg by mouth once a week.  . Multiple Vitamins-Minerals (PRESERVISION AREDS 2 PO) Take 1 tablet by mouth 2 (two) times daily. (0800 & 2000)  . nitroGLYCERIN (NITROSTAT) 0.4 MG SL tablet TAKE 1 TABLET EVERY 5 MINUTES AS NEEDED FOR CHEST PAIN  . polyethylene glycol (MIRALAX / GLYCOLAX) packet Take 17 g by mouth 2 (two) times daily. (Patient taking differently: Take 17 g by mouth as needed.)  . potassium chloride (KLOR-CON) 20 MEQ packet Take by mouth 2 (two) times daily.  . rosuvastatin (CRESTOR) 10 MG tablet Take 10 mg by mouth daily at 8 pm. (2000)   No facility-administered encounter medications on file as of 05/05/2020.    Objective:   PHYSICAL EXAMINATION:    VITALS:   Vitals:   05/05/20 1412  BP: (!) 150/80  Pulse: 76  SpO2: 96%  Weight: 203 lb (92.1 kg)  Height: 5\' 10"  (1.778 m)    GEN:  The patient appears stated age and is in NAD. HEENT:  Normocephalic,  atraumatic.  The mucous membranes are moist. The superficial temporal arteries are without ropiness or tenderness. CV:  RRR Lungs:  CTAB Neck/HEME:  There are no carotid bruits bilaterally.  Neurological examination:  Orientation: The patient is alert and oriented x3. Cranial nerves: There is good facial symmetry with facial hypomimia. The speech is fluent and clear. Soft palate rises symmetrically and there is no tongue deviation. Hearing is intact to conversational tone. Sensation: Sensation is intact to light touch throughout Motor: Strength is at least antigravity x4.  Movement examination: Tone: There is mild increased tone  in the bilateral UE Abnormal movements: Mild head dyskinesia Coordination:  There is min decremation with RAM's, with hand opening and closing on the L.  All other RAMs are normal Gait and Station: The patient has no difficulty arising out of a deep-seated chair without the use of the hands. The patient's stride length is slightly decreased but wide-based.   I have reviewed and interpreted the following labs independently    Chemistry      Component Value Date/Time   NA 139 02/10/2020 1129   K 3.8 02/10/2020 1129   CL 95 (L) 02/10/2020 1129   CO2 26 02/10/2020 1129   BUN 22 02/10/2020 1129   CREATININE 1.54 (H) 02/10/2020 1129      Component Value Date/Time   CALCIUM 9.5 02/10/2020 1129   ALKPHOS 171 (H) 04/12/2018 1642   AST 26 04/12/2018 1642   ALT 5 04/12/2018 1642   BILITOT 2.9 (H) 04/12/2018 1642       Lab Results  Component Value Date   WBC 3.7 (L) 04/12/2018   HGB 9.6 (L) 04/12/2018   HCT 32.9 (L) 04/12/2018   MCV 83.7 04/12/2018   PLT 165 04/12/2018    Lab Results  Component Value Date   TSH 2.94 09/15/2016     Total time spent on today's visit was 30 minutes, including both face-to-face time and nonface-to-face time.  Time included that spent on review of records (prior notes available to me/labs/imaging if pertinent), discussing treatment and goals, answering patient's questions and coordinating care.  Cc:  Hulan Fess, MD

## 2020-05-05 ENCOUNTER — Ambulatory Visit (INDEPENDENT_AMBULATORY_CARE_PROVIDER_SITE_OTHER): Payer: Medicare Other | Admitting: Neurology

## 2020-05-05 ENCOUNTER — Encounter: Payer: Self-pay | Admitting: Neurology

## 2020-05-05 ENCOUNTER — Other Ambulatory Visit: Payer: Self-pay

## 2020-05-05 VITALS — BP 150/80 | HR 76 | Ht 70.0 in | Wt 203.0 lb

## 2020-05-05 DIAGNOSIS — G20 Parkinson's disease: Secondary | ICD-10-CM | POA: Diagnosis not present

## 2020-05-05 DIAGNOSIS — G5793 Unspecified mononeuropathy of bilateral lower limbs: Secondary | ICD-10-CM

## 2020-05-05 MED ORDER — GABAPENTIN 300 MG PO CAPS: 300.0000 mg | ORAL_CAPSULE | Freq: Three times a day (TID) | ORAL | 1 refills | Status: DC

## 2020-05-05 NOTE — Patient Instructions (Signed)
1.  Increase gabapentin to 300 mg three times per day.    The physicians and staff at [facility] are committed to providing excellent care. You may receive a survey requesting feedback about your experience at our office. We strive to receive "very good" responses to the survey questions. If you feel that your experience would prevent you from giving the office a "very good " response, please contact our office to try to remedy the situation. We may be reached at [phone]. Thank you for taking the time out of your busy day to complete the survey.  Online Resources for Power over Parkinson's Group March 2022  . Local [place] Online Groups  o Power over [employer] Group :   - [employer] Over Parkinson's Patient Education Group will be Wednesday, March 9th at 2pm via Zoom.   - Upcoming Power over Parkinson's Meetings:  2nd Wednesdays of the month at 2 pm:       April 13th, May 11th - Contact Amy Marriott at [email or web address] if interested in participating in this online group o Parkinson's Care Partners Group:    3rd Mondays, Contact Corwin Levins o Atypical Parkinsonian Patient Group:   4th Wednesdays, Contact Corwin Levins o If you are interested in participating in these online groups with Judson Roch, please contact her directly for how to join those meetings.  Her contact information is [email or web address].  She will send you a link to join the [employer].  (Please note that Corwin Levins , MSW, LCSW, has resigned her position at [facility], but will continue to lead the online groups temporarily)  . [place]:  [email or web address] o PD Health at Home continues:  Mindfulness Mondays, Expert Briefing Tuesdays, Wellness Wednesdays, Take Time Thursdays, Fitness Fridays -Listings for March 2022 are on the website o Upcoming Webinar:  Conversations about Complementary Therapies and PD.  Wednesday, March 2nd @ 1 pm o Upcoming Webinar:  Can We Put the Brakes  on PD Progression?  Wednesday, April 6th @ 1 pm o Geneticist, molecular) at [email or web address] o  Please check out their website to sign up for emails and see their full online offerings  . [place]:  [email or web address]  o Upcoming Webinar:   Trouble Sleeping?  What to Know about Acting out Dreams and other Sleep Issues.  Thursday, March 17th @ 12 noon o Check out additional information on their website to see their full online offerings  . [place]:  [email or web address] o Upcoming Webinar:  Women and Parkinson's.  Tuesday, March 8th at 2 pm o Care Partner Monthly Meetup.  With 01-22-1991 [place].  First Tuesday of each month, 2 pm o Check out additional information to Live Well Today on their website  . Parkinson and Movement Disorders (PMD) Alliance:  www.pmdalliance.org o [employer] Online:  Online Education Events o Sign up for emails, which are sent weekly to give you updates on programming and online offerings   . Parkinson's Association of the [place]:  www.[email or web address] o Information on online support groups, education events, and online exercises including Yoga, Parkinson's exercises and more-LOTS of information on links to PD resources and online events o Virtual Support Group through Parkinson's Association of the [place]; next one is scheduled for Wednesday, June 03, 2020 at 2 pm. (These are typically scheduled for the 1st Wednesday of the month at 2 pm).  Visit website for details.  . Additional links for movement activities: o PWR! Moves Classes at [place] RESUMED!  Wednesdays 10 and 11 am.  Contact Amy Marriott, PT [email or web address] or [phone] if interested o Here is a link to the PWR!Moves classes on Zoom from [place] - Daily Mon-Sat at 10:00. Via Zoom, FREE and open to all.  There is also a link below via Facebook if you  use that platform. - [email or web address].si - [email or web address] o Parkinson's Wellness Recovery (PWR! Moves)  www.[email or web address] - Info on the PWR! Virtual Experience:  You will have access to our expertise through self-assessment, guided plans that start with the PD-specific fundamentals, educational content, tips, Q&A with an expert, and a growing Art therapist of PD-specific pre-recorded and live exercise classes of varying types and intensity - both physical and cognitive! If that is not enough, we offer 1:1 wellness consultations (in-person or virtual) to personalize your PWR! Research scientist (medical).  - Check out the PWR! Move of the month on the [place] Recovery website:  [email or web address] o [employer] Fridays:  - As part of the PD Health @ Home program, this free video series focuses each week on one aspect of fitness designed to support people living with Parkinson's.  These weekly videos highlight the [place] recent fitness guidelines for people with Parkinson's disease. -  [email or web address].dk o Dance for PD website is offering free, live-stream classes throughout the week, as well as links to [employer] of classes:  [email or web address] o Dance for Parkinson's Class:  [place].  Free offering for people with Parkinson's and care partners; virtual class.  o For more information, contact [phone] or email Ruffin Frederick at [email or web address] o Virtual dance and Pilates for Parkinson's classes: Click on the Community Tab> Parkinson's Movement Initiative Tab.  To register for classes and for more information, visit [email or web address]  and click the "community" tab.     o [employer] Parkinson's Cycling Classes  - [employer]: 1pm on Fridays-Live classes at [facility] [employer] at [email or web address] or [phone]) [facility] [employer]: Virtual Classes Mondays and Thursdays (contact [place] at [email or web address] or [phone])   o [employer] - Three levels of classes are offered Tuesdays and Thursdays:  10:30 am,  12 noon & 1:45 pm at [facility].  - Active Stretching with Paula Compton Class starting in March, on Fridays - To observe a class or for  more information, call [phone] or email [email or web address] . [employer]: o Chief Technology Officer Opportunities:  www.well-[email or web address]/caregiver-education/caregiver-support-group.  You may also contact Vickki Muff at [email or web address] or [phone].   o Virtual Caregiver Retreat, Monday, February 21st, 3-5 pm o Powerful Tools for Caregivers, 6-wk series for caregivers, in-person, Thursdays March 10, 17, 24, 31 and April 7, 14, from 10:30-12:30 at 01-15-1970, [place].  Contact [address] (see above) to register o Well-Spring Navigator:  Just1Navigator program, a free service to help individuals and families through the journey of determining care for older adults.  The "Navigator" is a Vickki Muff, Education officer, museum, who will speak with a prospective client and/or loved ones to provide an assessment of the situation and a set of recommendations for a personalized care plan -- all free of charge, and whether [employer] offers the needed service or not. If the need is not a service we provide, we are well-connected with reputable programs in town that we can refer you to.  www.well-[email or web address] or to speak with the Navigator, call [phone].

## 2020-05-08 ENCOUNTER — Other Ambulatory Visit: Payer: Self-pay | Admitting: Neurology

## 2020-05-08 NOTE — Telephone Encounter (Signed)
Rx(s) sent to pharmacy electronically.  

## 2020-05-18 ENCOUNTER — Other Ambulatory Visit: Payer: Self-pay

## 2020-05-18 ENCOUNTER — Encounter (HOSPITAL_BASED_OUTPATIENT_CLINIC_OR_DEPARTMENT_OTHER): Payer: Self-pay | Admitting: Emergency Medicine

## 2020-05-18 ENCOUNTER — Emergency Department (HOSPITAL_BASED_OUTPATIENT_CLINIC_OR_DEPARTMENT_OTHER): Payer: Medicare Other | Admitting: Radiology

## 2020-05-18 ENCOUNTER — Inpatient Hospital Stay (HOSPITAL_BASED_OUTPATIENT_CLINIC_OR_DEPARTMENT_OTHER)
Admission: EM | Admit: 2020-05-18 | Discharge: 2020-05-24 | DRG: 481 | Disposition: A | Discharge status: Home Health | Payer: Medicare Other | Attending: Internal Medicine | Admitting: Internal Medicine

## 2020-05-18 DIAGNOSIS — M25552 Pain in left hip: Secondary | ICD-10-CM

## 2020-05-18 DIAGNOSIS — I5032 Chronic diastolic (congestive) heart failure: Secondary | ICD-10-CM | POA: Diagnosis not present

## 2020-05-18 DIAGNOSIS — G20 Parkinson's disease: Secondary | ICD-10-CM | POA: Diagnosis present

## 2020-05-18 DIAGNOSIS — E785 Hyperlipidemia, unspecified: Secondary | ICD-10-CM | POA: Diagnosis present

## 2020-05-18 DIAGNOSIS — I4891 Unspecified atrial fibrillation: Secondary | ICD-10-CM | POA: Diagnosis present

## 2020-05-18 DIAGNOSIS — S72002A Fracture of unspecified part of neck of left femur, initial encounter for closed fracture: Secondary | ICD-10-CM | POA: Diagnosis present

## 2020-05-18 DIAGNOSIS — Z79899 Other long term (current) drug therapy: Secondary | ICD-10-CM

## 2020-05-18 DIAGNOSIS — N183 Chronic kidney disease, stage 3 unspecified: Secondary | ICD-10-CM | POA: Diagnosis present

## 2020-05-18 DIAGNOSIS — Z85820 Personal history of malignant melanoma of skin: Secondary | ICD-10-CM

## 2020-05-18 DIAGNOSIS — E7800 Pure hypercholesterolemia, unspecified: Secondary | ICD-10-CM | POA: Diagnosis present

## 2020-05-18 DIAGNOSIS — Z7901 Long term (current) use of anticoagulants: Secondary | ICD-10-CM

## 2020-05-18 DIAGNOSIS — I130 Hypertensive heart and chronic kidney disease with heart failure and stage 1 through stage 4 chronic kidney disease, or unspecified chronic kidney disease: Secondary | ICD-10-CM | POA: Diagnosis not present

## 2020-05-18 DIAGNOSIS — Z8546 Personal history of malignant neoplasm of prostate: Secondary | ICD-10-CM

## 2020-05-18 DIAGNOSIS — S7292XA Unspecified fracture of left femur, initial encounter for closed fracture: Secondary | ICD-10-CM

## 2020-05-18 DIAGNOSIS — I2510 Atherosclerotic heart disease of native coronary artery without angina pectoris: Secondary | ICD-10-CM | POA: Diagnosis present

## 2020-05-18 DIAGNOSIS — Y92000 Kitchen of unspecified non-institutional (private) residence as  the place of occurrence of the external cause: Secondary | ICD-10-CM

## 2020-05-18 DIAGNOSIS — S72142A Displaced intertrochanteric fracture of left femur, initial encounter for closed fracture: Secondary | ICD-10-CM | POA: Diagnosis not present

## 2020-05-18 DIAGNOSIS — N1832 Chronic kidney disease, stage 3b: Secondary | ICD-10-CM | POA: Diagnosis present

## 2020-05-18 DIAGNOSIS — R52 Pain, unspecified: Secondary | ICD-10-CM | POA: Diagnosis not present

## 2020-05-18 DIAGNOSIS — I272 Pulmonary hypertension, unspecified: Secondary | ICD-10-CM | POA: Diagnosis present

## 2020-05-18 DIAGNOSIS — Z20822 Contact with and (suspected) exposure to covid-19: Secondary | ICD-10-CM | POA: Diagnosis present

## 2020-05-18 DIAGNOSIS — Z955 Presence of coronary angioplasty implant and graft: Secondary | ICD-10-CM

## 2020-05-18 DIAGNOSIS — I420 Dilated cardiomyopathy: Secondary | ICD-10-CM | POA: Diagnosis not present

## 2020-05-18 DIAGNOSIS — J986 Disorders of diaphragm: Secondary | ICD-10-CM

## 2020-05-18 DIAGNOSIS — W1839XA Other fall on same level, initial encounter: Secondary | ICD-10-CM | POA: Diagnosis present

## 2020-05-18 DIAGNOSIS — E876 Hypokalemia: Secondary | ICD-10-CM | POA: Diagnosis present

## 2020-05-18 DIAGNOSIS — I4821 Permanent atrial fibrillation: Secondary | ICD-10-CM | POA: Diagnosis not present

## 2020-05-18 DIAGNOSIS — I10 Essential (primary) hypertension: Secondary | ICD-10-CM | POA: Diagnosis present

## 2020-05-18 DIAGNOSIS — S72143A Displaced intertrochanteric fracture of unspecified femur, initial encounter for closed fracture: Secondary | ICD-10-CM | POA: Diagnosis present

## 2020-05-18 LAB — CBC WITH DIFFERENTIAL/PLATELET
Abs Immature Granulocytes: 0.01 10*3/uL (ref 0.00–0.07)
Basophils Absolute: 0 10*3/uL (ref 0.0–0.1)
Basophils Relative: 1 %
Eosinophils Absolute: 0.2 10*3/uL (ref 0.0–0.5)
Eosinophils Relative: 3 %
HCT: 48.8 % (ref 39.0–52.0)
Hemoglobin: 15.8 g/dL (ref 13.0–17.0)
Immature Granulocytes: 0 %
Lymphocytes Relative: 16 %
Lymphs Abs: 0.9 10*3/uL (ref 0.7–4.0)
MCH: 29.3 pg (ref 26.0–34.0)
MCHC: 32.4 g/dL (ref 30.0–36.0)
MCV: 90.5 fL (ref 80.0–100.0)
Monocytes Absolute: 0.5 10*3/uL (ref 0.1–1.0)
Monocytes Relative: 9 %
Neutro Abs: 4.1 10*3/uL (ref 1.7–7.7)
Neutrophils Relative %: 71 %
Platelets: 169 10*3/uL (ref 150–400)
RBC: 5.39 MIL/uL (ref 4.22–5.81)
RDW: 14.2 % (ref 11.5–15.5)
WBC: 5.7 10*3/uL (ref 4.0–10.5)
nRBC: 0 % (ref 0.0–0.2)

## 2020-05-18 LAB — BASIC METABOLIC PANEL
Anion gap: 15 (ref 5–15)
BUN: 39 mg/dL — ABNORMAL HIGH (ref 8–23)
CO2: 37 mmol/L — ABNORMAL HIGH (ref 22–32)
Calcium: 9.9 mg/dL (ref 8.9–10.3)
Chloride: 86 mmol/L — ABNORMAL LOW (ref 98–111)
Creatinine, Ser: 1.9 mg/dL — ABNORMAL HIGH (ref 0.61–1.24)
GFR, Estimated: 35 mL/min — ABNORMAL LOW (ref >60)
Glucose, Bld: 105 mg/dL — ABNORMAL HIGH (ref 70–99)
Potassium: 2.9 mmol/L — ABNORMAL LOW (ref 3.5–5.1)
Sodium: 138 mmol/L (ref 135–145)

## 2020-05-18 LAB — RESP PANEL BY RT-PCR (FLU A&B, COVID) ARPGX2
Influenza A by PCR: NEGATIVE
Influenza B by PCR: NEGATIVE
SARS Coronavirus 2 by RT PCR: NEGATIVE

## 2020-05-18 MED ORDER — POTASSIUM CHLORIDE CRYS ER 20 MEQ PO TBCR
40.0000 meq | EXTENDED_RELEASE_TABLET | Freq: Once | ORAL | Status: AC
  Administered 2020-05-18: 40 meq via ORAL
  Filled 2020-05-18: qty 2

## 2020-05-18 MED ORDER — FENTANYL CITRATE (PF) 100 MCG/2ML IJ SOLN
50.0000 ug | Freq: Once | INTRAMUSCULAR | Status: AC
  Administered 2020-05-18: 50 ug via INTRAVENOUS
  Filled 2020-05-18: qty 2

## 2020-05-18 MED ORDER — MORPHINE SULFATE (PF) 4 MG/ML IV SOLN
4.0000 mg | Freq: Once | INTRAVENOUS | Status: AC
  Administered 2020-05-19: 4 mg via INTRAVENOUS
  Filled 2020-05-18: qty 1

## 2020-05-18 MED ORDER — MORPHINE SULFATE (PF) 4 MG/ML IV SOLN
4.0000 mg | Freq: Once | INTRAVENOUS | Status: AC
  Administered 2020-05-18: 4 mg via INTRAVENOUS
  Filled 2020-05-18: qty 1

## 2020-05-18 NOTE — ED Notes (Signed)
PT IS C/O 10/10 OF PAIN AT ITS WORSE , CAN NOT RAISE LEFT LEG UP AT ALL ,  PAIN ON PALPATION ON LEFT SIDE

## 2020-05-18 NOTE — ED Provider Notes (Signed)
[facility] EMERGENCY DEPT Provider Note   CSN: [id number] Arrival date & time: 05/18/20  2019     History Chief Complaint  Patient presents with  . Fall    FELL  FROM STANDING POSTION , LEFT HIP PAIN .  NO LOSS OF LOC     [name] is a 85 y.o. male.  HPI   85 year old male with past medical history of atrial fibrillation anticoagulated on Eliquis, HTN, HLD, CAD status post stent, Parkinson's disease presents the emergency department for mechanical fall.  Patient states he was standing in the kitchen, tried to lean over to grab an ice cube on the floor and fell down onto his left hip.  He had immediate pain.  Has been unable to ambulate secondary to the pain.  Denies any head injury, loss of consciousness.  Denies any syncopal aspect.  He is accompanied by the wife who witnessed the fall.  Currently he is experiencing left hip pain.  Denies any headache or neck/spine pain.  Past Medical History:  Diagnosis Date  . Arthritis    "knees; lower back" (08/09/2016)  . Atrial fibrillation ([place])    remote hx/notes 08/07/2016  . Coronary artery disease   . Episodes of trembling    "most of the time it's my left hand" (08/09/2016)  . GI bleeding 08/2017  . High cholesterol   . History of kidney stones   . Hypertension   . Melanoma of shoulder ([place]) 2000s   "left"  . Mitral regurgitation and mitral stenosis    hx/notes 08/07/2016  . Moderate aortic stenosis    hx/notes 08/07/2016  . Parkinson's disease ([place])   . Prostate cancer ([place]) 2011   hx/notes 08/07/2016    Patient Active Problem List   Diagnosis Date Noted  . Essential hypertension 07/21/2018  . Bilateral leg edema 07/21/2018  . Laboratory examination 05/14/2018  . Pulmonary hypertension ([place]) 03/16/2018  . Nonrheumatic mitral valve regurgitation 03/16/2018  . Tricuspid regurgitation 03/16/2018  . CKD (chronic kidney disease) stage 3, GFR 30-59 ml/min (HCC) 09/26/2017  . Chronic anticoagulation  09/26/2017  . Atrial fibrillation ([place]) 09/26/2017  . Atrial flutter ([place]) 02/11/2017  . Post PTCA 08/09/2016  . Dyspnea on exertion 08/07/2016  . Coronary artery disease involving native coronary artery of native heart without angina pectoris 08/07/2016    Past Surgical History:  Procedure Laterality Date  . CARDIAC CATHETERIZATION  ~ [address], [place]"  . CARDIOVERSION N/A 01/26/2017   Procedure: CARDIOVERSION;  Surgeon: [name], MD;  Location: [facility];  Service: Cardiovascular;  Laterality: N/A;  . CARDIOVERSION N/A 03/06/2018   Procedure: CARDIOVERSION;  Surgeon: [name], MD;  Location: [facility];  Service: Cardiovascular;  Laterality: N/A;  . CATARACT EXTRACTION W/ INTRAOCULAR LENS  IMPLANT, BILATERAL Bilateral 2014-02/2016   left-right; hx/notes 08/07/2016  . COLONOSCOPY WITH PROPOFOL N/A 09/25/2017   Procedure: COLONOSCOPY WITH PROPOFOL;  Surgeon: Laurence Spates, MD;  Location: [facility];  Service: Endoscopy;  Laterality: N/A;  . CORONARY ANGIOPLASTY WITH STENT PLACEMENT  08/09/2016  . CORONARY ATHERECTOMY N/A 08/09/2016   Procedure: Coronary Atherectomy;  Surgeon: Adrian Prows, MD;  Location: [facility];  Service: Cardiovascular;  Laterality: N/A;  . CORONARY STENT INTERVENTION N/A 08/09/2016   Procedure: Coronary Stent Intervention;  Surgeon: Adrian Prows, MD;  Location: [facility];  Service: Cardiovascular;  Laterality: N/A;  LAD  . EYE SURGERY Bilateral 05/2016   "laser on both lenses"  . HEMORRHOID SURGERY N/A  09/27/2017   Procedure: HEMORRHOIDECTOMY;  Surgeon: Erroll Luna, MD;  Location: [facility];  Service: General;  Laterality: N/A;  . INGUINAL HERNIA REPAIR Left 1991  . INSERTION PROSTATE RADIATION SEED  06/25/2009  . INTRAVASCULAR PRESSURE WIRE/FFR STUDY N/A 08/09/2016   Procedure: Intravascular Pressure Wire/FFR Study;  Surgeon: Adrian Prows, MD;  Location: [facility];  Service: Cardiovascular;  Laterality: N/A;  .  LAPAROSCOPIC CHOLECYSTECTOMY  1993  . MELANOMA EXCISION Left 1990s   "shoulder"  . MELANOMA EXCISION Right 08/2018   right shoulder  . RIGHT HEART CATH N/A 03/20/2018   Procedure: RIGHT HEART CATH;  Surgeon: [name], MD;  Location: [facility];  Service: Cardiovascular;  Laterality: N/A;  . RIGHT/LEFT HEART CATH AND CORONARY ANGIOGRAPHY N/A 08/09/2016   Procedure: Right/Left Heart Cath and Coronary Angiography;  Surgeon: Adrian Prows, MD;  Location: [facility];  Service: Cardiovascular;  Laterality: N/A;  . TEE WITHOUT CARDIOVERSION N/A 01/25/2017   Procedure: TRANSESOPHAGEAL ECHOCARDIOGRAM (TEE);  Surgeon: [name], MD;  Location: [facility];  Service: Cardiovascular;  Laterality: N/A;       Family History  Problem Relation Age of Onset  . Heart attack Father   . Cerebral aneurysm Sister   . Healthy Daughter     Social History   Tobacco Use  . Smoking status: Never Smoker  . Smokeless tobacco: Never Used  . Tobacco comment: "smoked  2 months when I was 20; non since" (08/09/2016)  Vaping Use  . Vaping Use: Never used  Substance Use Topics  . Alcohol use: Yes    Alcohol/week: 14.0 standard drinks    Types: 14 Shots of liquor per week    Comment: 2 drinks per day  . Drug use: No    Home Medications Prior to Admission medications   Medication Sig Start Date End Date Taking? Authorizing Provider  apixaban (ELIQUIS) 2.5 MG TABS tablet Take 1 tablet (2.5 mg total) by mouth 2 (two) times daily. (0800 & 2000) 04/03/20   Patwardhan, Manish J, MD  carbidopa-levodopa (SINEMET IR) 25-100 MG tablet TAKE 2 TABLETS BY MOUTH 3 (THREE) TIMES DAILY. 05/08/20   Tat, Eustace Quail, DO  Cholecalciferol (VITAMIN D-3) 1000 units CAPS Take 1,000 Units by mouth daily. (0800)    [provider]  dapagliflozin propanediol (FARXIGA) 10 MG TABS tablet Take 1 tablet by mouth daily.    [provider]  diltiazem (CARDIZEM CD) 120 MG 24 hr capsule Take 1  capsule (120 mg total) by mouth daily. 11/11/19 02/09/20  Patwardhan, Reynold Bowen, MD  furosemide (LASIX) 80 MG tablet Take 1 tablet (80 mg total) by mouth as directed. 2 pills in morning, 1 pill in afternoon 02/19/20   Patwardhan, Manish J, MD  gabapentin (NEURONTIN) 300 MG capsule Take 1 capsule (300 mg total) by mouth 3 (three) times daily. 05/05/20   Tat, Eustace Quail, DO  metolazone (ZAROXOLYN) 2.5 MG tablet Take 2.5 mg by mouth once a week.    [provider]  Multiple Vitamins-Minerals (PRESERVISION AREDS 2 PO) Take 1 tablet by mouth 2 (two) times daily. (0800 & 2000)    [provider]  nitroGLYCERIN (NITROSTAT) 0.4 MG SL tablet TAKE 1 TABLET EVERY 5 MINUTES AS NEEDED FOR CHEST PAIN 11/14/18   Adrian Prows, MD  polyethylene glycol (MIRALAX / GLYCOLAX) packet Take 17 g by mouth 2 (two) times daily. Patient taking differently: Take 17 g by mouth as needed. 09/29/17   Hosie Poisson, MD  potassium chloride (KLOR-CON) 20 MEQ packet Take by mouth 2 (two) times daily.    [provider]  rosuvastatin (CRESTOR) 10 MG tablet Take 10 mg by mouth daily at 8 pm. (2000)    [provider]    Allergies    Nsaids  Review of Systems   Review of Systems  Constitutional: Negative for chills and fever.  HENT: Negative for congestion.   Eyes: Negative for visual disturbance.  Respiratory: Negative for shortness of breath.   Cardiovascular: Negative for chest pain.  Gastrointestinal: Negative for abdominal pain, diarrhea and vomiting.  Genitourinary: Negative for dysuria.  Musculoskeletal: Positive for neck pain. Negative for back pain.       + Left hip pain  Skin: Negative for rash.  Neurological: Negative for syncope and headaches.    Physical Exam Updated Vital Signs BP (!) 149/76 (BP Location: Right Arm)   Pulse (!) 52   Temp 97.8 F (36.6 C) (Oral)   Resp 20   Ht 5\' 10"  (1.778 m)   Wt 89 kg   SpO2 96%   BMI 28.15 kg/m   Physical Exam Vitals and nursing note  reviewed.  Constitutional:      Appearance: Normal appearance.  HENT:     Head: Normocephalic.     Mouth/Throat:     Mouth: Mucous membranes are moist.  Cardiovascular:     Rate and Rhythm: Normal rate.  Pulmonary:     Effort: Pulmonary effort is normal. No respiratory distress.  Abdominal:     Palpations: Abdomen is soft.     Tenderness: There is no abdominal tenderness.  Musculoskeletal:     Comments: Swelling and tenderness to palpation of the left hip, pelvis is stable, chronic venous stasis changes of the bilateral lower extremities  Skin:    General: Skin is warm.  Neurological:     Mental Status: He is alert and oriented to person, place, and time. Mental status is at baseline.  Psychiatric:        Mood and Affect: Mood normal.     ED Results / Procedures / Treatments   Labs (all labs ordered are listed, but only abnormal results are displayed) Labs Reviewed  CBC WITH DIFFERENTIAL/PLATELET  BASIC METABOLIC PANEL    EKG None  Radiology No results found.  Procedures Procedures   Medications Ordered in ED Medications  fentaNYL (SUBLIMAZE) injection 50 mcg (has no administration in time range)    ED Course  I have reviewed the triage vital signs and the nursing notes.  Pertinent labs & imaging results that were available during my care of the patient were reviewed by me and considered in my medical decision making (see chart for details).    MDM Rules/Calculators/A&P                          85 year old male presents the emergency department for mechanical fall down onto the left hip.  No head injury/loss of consciousness/syncope.  Patient's blood work shows a slightly worse than baseline AKI and hypokalemia that has been replaced in the department.  X-ray shows mildly displaced intertrochanteric fracture on the left, the lower extremity is neurovascularly intact.  Spoke with on-call orthopedic surgeon Dr. Rolena Infante who recommends transfer to [facility] and  medical admission for evaluation and surgical intervention.  Patients evaluation and results requires admission for further treatment and care. Patient agrees with admission plan, offers no new complaints and is stable/unchanged at time of  admit.  Final Clinical Impression(s) / ED Diagnoses Final diagnoses:  None    Rx / DC Orders ED Discharge Orders    None       Lorelle Gibbs, DO 05/18/20 2233

## 2020-05-18 NOTE — ED Notes (Signed)
Patient here with Fire Dept. From Home after Fall.  Patient was in kitchen when patient fell bending over to pick up an object.  Left Hip affected. Patient does take Eliquis (MD Horton made aware), no Obvious Head Trauma Present and patient states only Left Hip was affected.   Psychologist, occupational. Arrived on Scene and EMS Dispatch stated they were not available to assess the patient at that time and were unable to give a reasonable estimate of time for EMS assessment. Patient although was not ambulatory and required assistance from [employer]. To be taken into POV Vehicle.   Patient here with Family. Patient non-ambulatory. GCS 15.

## 2020-05-18 NOTE — ED Notes (Signed)
[facility] at Bedside; Report given to same.

## 2020-05-18 NOTE — ED Notes (Signed)
Patient transported to Radiology at this time.

## 2020-05-18 NOTE — ED Notes (Signed)
MD Horton made aware that patient fell and patient does take Eliquis. No obvious other Trauma present.

## 2020-05-18 NOTE — ED Notes (Addendum)
Patient returned from Radiology at this time.  

## 2020-05-19 ENCOUNTER — Telehealth: Payer: Self-pay

## 2020-05-19 ENCOUNTER — Emergency Department (HOSPITAL_BASED_OUTPATIENT_CLINIC_OR_DEPARTMENT_OTHER): Admission: EM | Admit: 2020-05-19 | Payer: Medicare Other | Source: Home / Self Care

## 2020-05-19 ENCOUNTER — Inpatient Hospital Stay (HOSPITAL_COMMUNITY): Payer: Medicare Other

## 2020-05-19 DIAGNOSIS — S72002A Fracture of unspecified part of neck of left femur, initial encounter for closed fracture: Secondary | ICD-10-CM | POA: Diagnosis not present

## 2020-05-19 DIAGNOSIS — E785 Hyperlipidemia, unspecified: Secondary | ICD-10-CM | POA: Diagnosis present

## 2020-05-19 DIAGNOSIS — N1832 Chronic kidney disease, stage 3b: Secondary | ICD-10-CM

## 2020-05-19 DIAGNOSIS — W19XXXA Unspecified fall, initial encounter: Secondary | ICD-10-CM | POA: Diagnosis not present

## 2020-05-19 DIAGNOSIS — I130 Hypertensive heart and chronic kidney disease with heart failure and stage 1 through stage 4 chronic kidney disease, or unspecified chronic kidney disease: Secondary | ICD-10-CM | POA: Diagnosis present

## 2020-05-19 DIAGNOSIS — S72142A Displaced intertrochanteric fracture of left femur, initial encounter for closed fracture: Secondary | ICD-10-CM | POA: Diagnosis present

## 2020-05-19 DIAGNOSIS — I4821 Permanent atrial fibrillation: Secondary | ICD-10-CM | POA: Diagnosis present

## 2020-05-19 DIAGNOSIS — N183 Chronic kidney disease, stage 3 unspecified: Secondary | ICD-10-CM | POA: Diagnosis present

## 2020-05-19 DIAGNOSIS — J811 Chronic pulmonary edema: Secondary | ICD-10-CM | POA: Diagnosis not present

## 2020-05-19 DIAGNOSIS — I272 Pulmonary hypertension, unspecified: Secondary | ICD-10-CM

## 2020-05-19 DIAGNOSIS — R5381 Other malaise: Secondary | ICD-10-CM | POA: Diagnosis not present

## 2020-05-19 DIAGNOSIS — Z8546 Personal history of malignant neoplasm of prostate: Secondary | ICD-10-CM | POA: Diagnosis not present

## 2020-05-19 DIAGNOSIS — J986 Disorders of diaphragm: Secondary | ICD-10-CM

## 2020-05-19 DIAGNOSIS — I10 Essential (primary) hypertension: Secondary | ICD-10-CM

## 2020-05-19 DIAGNOSIS — I5032 Chronic diastolic (congestive) heart failure: Secondary | ICD-10-CM | POA: Diagnosis present

## 2020-05-19 DIAGNOSIS — R52 Pain, unspecified: Secondary | ICD-10-CM | POA: Diagnosis not present

## 2020-05-19 DIAGNOSIS — I2510 Atherosclerotic heart disease of native coronary artery without angina pectoris: Secondary | ICD-10-CM | POA: Diagnosis present

## 2020-05-19 DIAGNOSIS — Z79899 Other long term (current) drug therapy: Secondary | ICD-10-CM | POA: Diagnosis not present

## 2020-05-19 DIAGNOSIS — Z20822 Contact with and (suspected) exposure to covid-19: Secondary | ICD-10-CM | POA: Diagnosis present

## 2020-05-19 DIAGNOSIS — E876 Hypokalemia: Secondary | ICD-10-CM

## 2020-05-19 DIAGNOSIS — S7292XA Unspecified fracture of left femur, initial encounter for closed fracture: Secondary | ICD-10-CM

## 2020-05-19 DIAGNOSIS — I420 Dilated cardiomyopathy: Secondary | ICD-10-CM

## 2020-05-19 DIAGNOSIS — W1839XA Other fall on same level, initial encounter: Secondary | ICD-10-CM | POA: Diagnosis present

## 2020-05-19 DIAGNOSIS — M2550 Pain in unspecified joint: Secondary | ICD-10-CM | POA: Diagnosis not present

## 2020-05-19 DIAGNOSIS — Z9889 Other specified postprocedural states: Secondary | ICD-10-CM | POA: Diagnosis not present

## 2020-05-19 DIAGNOSIS — G20 Parkinson's disease: Secondary | ICD-10-CM | POA: Diagnosis present

## 2020-05-19 DIAGNOSIS — I4892 Unspecified atrial flutter: Secondary | ICD-10-CM | POA: Diagnosis not present

## 2020-05-19 DIAGNOSIS — E7800 Pure hypercholesterolemia, unspecified: Secondary | ICD-10-CM | POA: Diagnosis present

## 2020-05-19 DIAGNOSIS — G20A1 Parkinson's disease without dyskinesia, without mention of fluctuations: Secondary | ICD-10-CM | POA: Diagnosis present

## 2020-05-19 DIAGNOSIS — Y92000 Kitchen of unspecified non-institutional (private) residence as  the place of occurrence of the external cause: Secondary | ICD-10-CM | POA: Diagnosis not present

## 2020-05-19 DIAGNOSIS — Z955 Presence of coronary angioplasty implant and graft: Secondary | ICD-10-CM | POA: Diagnosis not present

## 2020-05-19 DIAGNOSIS — Z7901 Long term (current) use of anticoagulants: Secondary | ICD-10-CM | POA: Diagnosis not present

## 2020-05-19 DIAGNOSIS — Z7401 Bed confinement status: Secondary | ICD-10-CM | POA: Diagnosis not present

## 2020-05-19 DIAGNOSIS — I517 Cardiomegaly: Secondary | ICD-10-CM | POA: Diagnosis not present

## 2020-05-19 DIAGNOSIS — Z85820 Personal history of malignant melanoma of skin: Secondary | ICD-10-CM | POA: Diagnosis not present

## 2020-05-19 LAB — COMPREHENSIVE METABOLIC PANEL
ALT: 9 U/L (ref 0–44)
AST: 36 U/L (ref 15–41)
Albumin: 3.9 g/dL (ref 3.5–5.0)
Alkaline Phosphatase: 128 U/L — ABNORMAL HIGH (ref 38–126)
Anion gap: 15 (ref 5–15)
BUN: 40 mg/dL — ABNORMAL HIGH (ref 8–23)
CO2: 32 mmol/L (ref 22–32)
Calcium: 9 mg/dL (ref 8.9–10.3)
Chloride: 89 mmol/L — ABNORMAL LOW (ref 98–111)
Creatinine, Ser: 1.84 mg/dL — ABNORMAL HIGH (ref 0.61–1.24)
GFR, Estimated: 36 mL/min — ABNORMAL LOW (ref >60)
Glucose, Bld: 127 mg/dL — ABNORMAL HIGH (ref 70–99)
Potassium: 3.1 mmol/L — ABNORMAL LOW (ref 3.5–5.1)
Sodium: 136 mmol/L (ref 135–145)
Total Bilirubin: 3 mg/dL — ABNORMAL HIGH (ref 0.3–1.2)
Total Protein: 7.4 g/dL (ref 6.5–8.1)

## 2020-05-19 LAB — MAGNESIUM
Magnesium: 2.6 mg/dL — ABNORMAL HIGH (ref 1.7–2.4)
Magnesium: 2.6 mg/dL — ABNORMAL HIGH (ref 1.7–2.4)

## 2020-05-19 LAB — HEPARIN LEVEL (UNFRACTIONATED): Heparin Unfractionated: 1.12 IU/mL — ABNORMAL HIGH (ref 0.30–0.70)

## 2020-05-19 LAB — CBC
HCT: 46 % (ref 39.0–52.0)
Hemoglobin: 14.9 g/dL (ref 13.0–17.0)
MCH: 29.7 pg (ref 26.0–34.0)
MCHC: 32.4 g/dL (ref 30.0–36.0)
MCV: 91.6 fL (ref 80.0–100.0)
Platelets: 148 10*3/uL — ABNORMAL LOW (ref 150–400)
RBC: 5.02 MIL/uL (ref 4.22–5.81)
RDW: 14.3 % (ref 11.5–15.5)
WBC: 8.1 10*3/uL (ref 4.0–10.5)
nRBC: 0 % (ref 0.0–0.2)

## 2020-05-19 LAB — POTASSIUM: Potassium: 3.4 mmol/L — ABNORMAL LOW (ref 3.5–5.1)

## 2020-05-19 LAB — APTT: aPTT: 44 s — ABNORMAL HIGH (ref 24–36)

## 2020-05-19 LAB — PHOSPHORUS: Phosphorus: 5 mg/dL — ABNORMAL HIGH (ref 2.5–4.6)

## 2020-05-19 MED ORDER — CHLORHEXIDINE GLUCONATE 4 % EX LIQD
60.0000 mL | Freq: Once | CUTANEOUS | Status: AC
  Administered 2020-05-20: 4 via TOPICAL
  Filled 2020-05-19: qty 60

## 2020-05-19 MED ORDER — POTASSIUM CHLORIDE CRYS ER 20 MEQ PO TBCR
20.0000 meq | EXTENDED_RELEASE_TABLET | Freq: Once | ORAL | Status: AC
  Administered 2020-05-19: 20 meq via ORAL
  Filled 2020-05-19: qty 1

## 2020-05-19 MED ORDER — ONDANSETRON HCL 4 MG/2ML IJ SOLN: 4.0000 mg | Freq: Four times a day (QID) | INTRAMUSCULAR | Status: DC | PRN

## 2020-05-19 MED ORDER — SODIUM CHLORIDE 0.9 % IV SOLN: INTRAVENOUS | Status: DC

## 2020-05-19 MED ORDER — MORPHINE SULFATE (PF) 4 MG/ML IV SOLN
4.0000 mg | INTRAVENOUS | Status: DC | PRN
Start: 2020-05-19 — End: 2020-05-20

## 2020-05-19 MED ORDER — ENSURE SURGERY PO LIQD
237.0000 mL | Freq: Two times a day (BID) | ORAL | Status: DC
  Administered 2020-05-19 – 2020-05-22 (×5): 237 mL via ORAL
  Filled 2020-05-19 (×11): qty 237

## 2020-05-19 MED ORDER — ACETAMINOPHEN 325 MG PO TABS: 650.0000 mg | ORAL_TABLET | Freq: Four times a day (QID) | ORAL | Status: DC | PRN

## 2020-05-19 MED ORDER — TRANEXAMIC ACID-NACL 1000-0.7 MG/100ML-% IV SOLN
1000.0000 mg | INTRAVENOUS | Status: AC
  Administered 2020-05-20: 1000 mg via INTRAVENOUS
  Filled 2020-05-19: qty 100

## 2020-05-19 MED ORDER — POTASSIUM CHLORIDE 10 MEQ/100ML IV SOLN
10.0000 meq | INTRAVENOUS | Status: AC
  Administered 2020-05-19 (×4): 10 meq via INTRAVENOUS
  Filled 2020-05-19 (×3): qty 100

## 2020-05-19 MED ORDER — CEFAZOLIN SODIUM-DEXTROSE 2-4 GM/100ML-% IV SOLN
2.0000 g | INTRAVENOUS | Status: AC
  Administered 2020-05-20: 2 g via INTRAVENOUS
  Filled 2020-05-19 (×2): qty 100

## 2020-05-19 MED ORDER — CARBIDOPA-LEVODOPA 25-100 MG PO TABS
2.0000 | ORAL_TABLET | Freq: Three times a day (TID) | ORAL | Status: DC
  Administered 2020-05-19 – 2020-05-24 (×16): 2 via ORAL
  Filled 2020-05-19 (×16): qty 2

## 2020-05-19 MED ORDER — ADULT MULTIVITAMIN W/MINERALS CH
1.0000 | ORAL_TABLET | Freq: Every day | ORAL | Status: DC
  Administered 2020-05-19 – 2020-05-24 (×6): 1 via ORAL
  Filled 2020-05-19 (×6): qty 1

## 2020-05-19 MED ORDER — POTASSIUM CHLORIDE CRYS ER 10 MEQ PO TBCR
10.0000 meq | EXTENDED_RELEASE_TABLET | Freq: Once | ORAL | Status: AC
  Administered 2020-05-19: 10 meq via ORAL
  Filled 2020-05-19: qty 1

## 2020-05-19 MED ORDER — ROSUVASTATIN CALCIUM 10 MG PO TABS
10.0000 mg | ORAL_TABLET | Freq: Every day | ORAL | Status: DC
  Administered 2020-05-19: 10 mg via ORAL
  Filled 2020-05-19: qty 1

## 2020-05-19 MED ORDER — GABAPENTIN 300 MG PO CAPS
300.0000 mg | ORAL_CAPSULE | Freq: Three times a day (TID) | ORAL | Status: DC
  Administered 2020-05-19 – 2020-05-22 (×10): 300 mg via ORAL
  Filled 2020-05-19 (×10): qty 1

## 2020-05-19 MED ORDER — MORPHINE SULFATE (PF) 4 MG/ML IV SOLN
4.0000 mg | INTRAVENOUS | Status: DC | PRN
Start: 2020-05-19 — End: 2020-05-19

## 2020-05-19 MED ORDER — POVIDONE-IODINE 10 % EX SWAB
2.0000 "application " | Freq: Once | CUTANEOUS | Status: AC
  Administered 2020-05-20: 2 via TOPICAL

## 2020-05-19 MED ORDER — MORPHINE SULFATE (PF) 4 MG/ML IV SOLN
4.0000 mg | INTRAVENOUS | Status: DC | PRN
  Administered 2020-05-19: 4 mg via INTRAVENOUS
  Filled 2020-05-19: qty 1

## 2020-05-19 MED ORDER — SENNOSIDES-DOCUSATE SODIUM 8.6-50 MG PO TABS
1.0000 | ORAL_TABLET | Freq: Two times a day (BID) | ORAL | Status: DC
  Administered 2020-05-19 – 2020-05-20 (×4): 1 via ORAL
  Filled 2020-05-19 (×4): qty 1

## 2020-05-19 MED ORDER — DILTIAZEM HCL ER COATED BEADS 120 MG PO CP24
120.0000 mg | ORAL_CAPSULE | Freq: Every day | ORAL | Status: DC
  Administered 2020-05-19 – 2020-05-24 (×6): 120 mg via ORAL
  Filled 2020-05-19 (×6): qty 1

## 2020-05-19 MED ORDER — NITROGLYCERIN 0.4 MG SL SUBL: 0.4000 mg | SUBLINGUAL_TABLET | SUBLINGUAL | Status: DC | PRN

## 2020-05-19 MED ORDER — OXYCODONE HCL 5 MG PO TABS: 5.0000 mg | ORAL_TABLET | ORAL | Status: DC | PRN

## 2020-05-19 MED ORDER — ALBUTEROL SULFATE HFA 108 (90 BASE) MCG/ACT IN AERS
2.0000 | INHALATION_SPRAY | Freq: Four times a day (QID) | RESPIRATORY_TRACT | Status: DC | PRN
  Administered 2020-05-20 – 2020-05-21 (×2): 2 via RESPIRATORY_TRACT
  Filled 2020-05-19: qty 6.7

## 2020-05-19 MED ORDER — ACETAMINOPHEN 650 MG RE SUPP: 650.0000 mg | Freq: Four times a day (QID) | RECTAL | Status: DC | PRN

## 2020-05-19 MED ORDER — ONDANSETRON HCL 4 MG PO TABS: 4.0000 mg | ORAL_TABLET | Freq: Four times a day (QID) | ORAL | Status: DC | PRN

## 2020-05-19 MED ORDER — HEPARIN (PORCINE) 25000 UT/250ML-% IV SOLN
1500.0000 [IU]/h | INTRAVENOUS | Status: DC
  Administered 2020-05-19: 1300 [IU]/h via INTRAVENOUS
  Filled 2020-05-19: qty 250

## 2020-05-19 MED ORDER — OXYCODONE HCL ER 10 MG PO T12A
10.0000 mg | EXTENDED_RELEASE_TABLET | Freq: Two times a day (BID) | ORAL | Status: DC
  Administered 2020-05-19 – 2020-05-20 (×2): 10 mg via ORAL
  Filled 2020-05-19 (×2): qty 1

## 2020-05-19 MED ORDER — VITAMIN D3 25 MCG (1000 UNIT) PO TABS
1000.0000 [IU] | ORAL_TABLET | Freq: Every day | ORAL | Status: DC
  Administered 2020-05-19 – 2020-05-24 (×6): 1000 [IU] via ORAL
  Filled 2020-05-19 (×6): qty 1

## 2020-05-19 NOTE — Progress Notes (Signed)
PROGRESS NOTE    [name]  ZOX:[id number] DOB: 05/03/1935 DOA: 05/18/2020 PCP: Carol Ada, MD     Brief Narrative:  [name] is a 85 y.o. WM PMHx of CKD  Stage III, A. fib on Eliquis, Dilated Cardiomyopathy (see echo 01/24/2017), MV regurgitation, MV stenosis, AV stenosis, essential HTN, CAD, Parkinson's, prostate cancer, dyslipidemia   Presented to the ER on 05/18/2020 with hip pain after ground-level fall.  Patient states he was in his kitchen when he leaned over to pick up Ice Cube on the floor and he fell on his left hip.  He had immediate pain.  He was unable to get up.  He did not hit his head or lose consciousness.  No neck pain.  He did not take any pain medication prior to arrival. His wife witnessed the fall.  Because he could not get up and he was in significant pain, he was brought into the ER.   He denies previous history of falls.  He said he is compliant with his home medications, on Eliquis for his A. fib.    ED course: -Vitals on admission: Febrile, heart rate 52, respiratory rate 20, blood pressure 149/76, maintaining sats on room air -Labs on initial presentation: 138, potassium 2.9, chloride 86, bicarb 37, glucose 105, BUN 39, creatinine 1.9, WBC 5.7, hemoglobin 15.8, Covid negative -Imaging obtained on admission: X-ray showed left hip fracture -In the ED the patient was given morphine, potassium supplementation.  Orthopedics was contacted and recommended transfer for surgery and have. the hospitalist service was contacted for further evaluation and management.    Subjective: Afebrile overnight A/O x4, right pain 7/10.   Assessment & Plan: Covid vaccination; vaccinated 3/3   Active Problems:   CKD (chronic kidney disease) stage 3, GFR 30-59 ml/min (HCC)   Atrial fibrillation (HCC)   Pulmonary hypertension (HCC)   Essential hypertension   Closed left hip fracture (HCC)   Intertrochanteric fracture (HCC)   Hypokalemia    Dilated cardiomyopathy (HCC)   CKD (chronic kidney disease), stage III ([place])   Parkinson's disease ([place])   Fall -->Left hip fracture -Imaging shows left intertrochanter fracture -3/22 Dr. Lyla Glassing [facility] orthopedic plans on repair of hip on 3/23.   -Fall precautions -PT and OT consulted -3/22 increased breakthrough pain medication. -3/22 OxyContin 10 mg BID  Chronic leg edema -Multifactorial including valvular heart disease, dilated cardiomyopathy -Hold home diuretics for now given hypokalemia  Permanent atrial fibrillation -Cardizem 120 mg daily  -Eliquis (hold) -Heparin drip  Dilated Cardiomyopathy Pulmonary hypertension -see echo 01/24/2017 -Strict in and out -Daily weight -Gentle hydration normal saline 53ml/hr (see CKD stage IIIb)  Right Elevated Hemidiaphragm -Per wife on albuterol inhalers at home however not on his [facility] -Patient wheezing and pursed lip breathing. -3/22 obtain PCXR -Albuterol PRN  CKD stage IIIb (baseline Cr 1.9) Lab Results  Component Value Date   CREATININE 1.84 (H) 05/19/2020   CREATININE 1.90 (H) 05/18/2020   CREATININE 1.54 (H) 02/10/2020   CREATININE 1.78 (H) 11/01/2019   CREATININE 1.37 (H) 10/10/2019   Parkinson's disease -Sinemet 25-100 TID  Hypokalemia -Upon arrival K2.9 -Potassium goal> 4 -3/22 potassium IV 50 mEq -3/22 repeat K/Mg    DVT prophylaxis: Heparin drip Code Status:  Family Communication: 3/22 wife at bedside for discussion of plan of care answered all questions Status is: Inpatient    Dispo: The patient is from: Home              Anticipated d/c is to:  Home              Anticipated d/c date is: Per surgery              Patient currently unstable      Consultants:  [facility] orthopedic surgery  Procedures/Significant Events:  3/21 DG LEFT NWG:NFAOZH displaced intertrochanteric fracture LEFT femur 3/21 DG LEFT femur 2 view;Minimally displaced intertrochanteric fracture LEFT femur   I have  personally reviewed and interpreted all radiology studies and my findings are as above.  VENTILATOR SETTINGS:    Cultures 3/21 SARS coronavirus negative  Antimicrobials: Anti-infectives (From admission, onward)   Start     Ordered Stop   05/20/20 0600  ceFAZolin (ANCEF) IVPB 2g/100 mL premix        05/19/20 1308 05/21/20 0559       Devices    LINES / TUBES:      Continuous Infusions: . sodium chloride    . [START ON 05/20/2020]  ceFAZolin (ANCEF) IV    . heparin    . [START ON 05/20/2020] tranexamic acid       Objective: Vitals:   05/19/20 0048 05/19/20 0511 05/19/20 1018 05/19/20 1433  BP: 127/80 121/74 138/74 127/64  Pulse: 84 73 79 76  Resp: 18 17 16 16   Temp: 97.8 F (36.6 C) 98.3 F (36.8 C) 98 F (36.7 C) 98.1 F (36.7 C)  TempSrc: Oral Oral Oral Oral  SpO2: 90% 94% 94% 95%  Weight:      Height:        Intake/Output Summary (Last 24 hours) at 05/19/2020 1654 Last data filed at 05/19/2020 0500 Gross per 24 hour  Intake --  Output 250 ml  Net -250 ml   Filed Weights   05/18/20 2030  Weight: 89 kg    Examination:  General: A/O x4, No acute respiratory distress Eyes: negative scleral hemorrhage, negative anisocoria, negative icterus ENT: Negative Runny nose, negative gingival bleeding, Neck:  Negative scars, masses, torticollis, lymphadenopathy, JVD Lungs: Clear to auscultation bilaterally without, positive mild expiratory wheezing, negative crackles Cardiovascular: Irregular irregular rhythm and rate without murmur gallop or rub normal S1 and S2 Abdomen: negative abdominal pain, nondistended, positive soft, bowel sounds, no rebound, no ascites, no appreciable mass Extremities: No significant cyanosis, clubbing left lower extremity swollen painful to palpation at the hip appropriately so for a fractured hip Skin: Negative rashes, lesions, ulcers Psychiatric:  Negative depression, negative anxiety, negative fatigue, negative mania  Central  nervous system:  Cranial nerves II through XII intact, tongue/uvula midline, all extremities muscle strength 5/5, sensation intact throughout, negative dysarthria, negative expressive aphasia, negative receptive aphasia.  .     Data Reviewed: Care during the described time interval was provided by me .  I have reviewed this patient's available data, including medical history, events of note, physical examination, and all test results as part of my evaluation.  CBC: Recent Labs  Lab 05/18/20 2049 05/19/20 0624  WBC 5.7 8.1  NEUTROABS 4.1  --   HGB 15.8 14.9  HCT 48.8 46.0  MCV 90.5 91.6  PLT 169 086*   Basic Metabolic Panel: Recent Labs  Lab 05/18/20 2049 05/19/20 0624  NA 138 136  K 2.9* 3.1*  CL 86* 89*  CO2 37* 32  GLUCOSE 105* 127*  BUN 39* 40*  CREATININE 1.90* 1.84*  CALCIUM 9.9 9.0  MG  --  2.6*  PHOS  --  5.0*   GFR: Estimated Creatinine Clearance: 34.8 mL/min (A) (by C-G formula based  on SCr of 1.84 mg/dL (H)). Liver Function Tests: Recent Labs  Lab 05/19/20 0624  AST 36  ALT 9  ALKPHOS 128*  BILITOT 3.0*  PROT 7.4  ALBUMIN 3.9   No results for input(s): LIPASE, AMYLASE in the last 168 hours. No results for input(s): AMMONIA in the last 168 hours. Coagulation Profile: No results for input(s): INR, PROTIME in the last 168 hours. Cardiac Enzymes: No results for input(s): CKTOTAL, CKMB, CKMBINDEX, TROPONINI in the last 168 hours. BNP (last 3 results) Recent Labs    10/10/19 1037 11/01/19 1322  PROBNP 1,547* 2,160*   HbA1C: No results for input(s): HGBA1C in the last 72 hours. CBG: No results for input(s): GLUCAP in the last 168 hours. Lipid Profile: No results for input(s): CHOL, HDL, LDLCALC, TRIG, CHOLHDL, LDLDIRECT in the last 72 hours. Thyroid Function Tests: No results for input(s): TSH, T4TOTAL, FREET4, T3FREE, THYROIDAB in the last 72 hours. Anemia Panel: No results for input(s): VITAMINB12, FOLATE, FERRITIN, TIBC, IRON, RETICCTPCT in  the last 72 hours. Sepsis Labs: No results for input(s): PROCALCITON, LATICACIDVEN in the last 168 hours.  Recent Results (from the past 240 hour(s))  Resp Panel by RT-PCR (Flu A&B, Covid) Nasopharyngeal Swab     Status: None   Collection Time: 05/18/20 10:49 PM   Specimen: Nasopharyngeal Swab; Nasopharyngeal(NP) swabs in vial transport medium  Result Value Ref Range Status   SARS Coronavirus 2 by RT PCR NEGATIVE NEGATIVE Final    Comment: (NOTE) SARS-CoV-2 target nucleic acids are NOT DETECTED.  The SARS-CoV-2 RNA is generally detectable in upper respiratory specimens during the acute phase of infection. The lowest concentration of SARS-CoV-2 viral copies this assay can detect is 138 copies/mL. A negative result does not preclude SARS-Cov-2 infection and should not be used as the sole basis for treatment or other patient management decisions. A negative result may occur with  improper specimen collection/handling, submission of specimen other than nasopharyngeal swab, presence of viral mutation(s) within the areas targeted by this assay, and inadequate number of viral copies(<138 copies/mL). A negative result must be combined with clinical observations, patient history, and epidemiological information. The expected result is Negative.  Fact Sheet for Patients:  EntrepreneurPulse.com.au  Fact Sheet for Healthcare Providers:  IncredibleEmployment.be  This test is no t yet approved or cleared by the [place] FDA and  has been authorized for detection and/or diagnosis of SARS-CoV-2 by FDA under an Emergency Use Authorization (EUA). This EUA will remain  in effect (meaning this test can be used) for the duration of the COVID-19 declaration under Section 564(b)(1) of the Act, 21 U.S.C.section 360bbb-3(b)(1), unless the authorization is terminated  or revoked sooner.       Influenza A by PCR NEGATIVE NEGATIVE Final   Influenza B by PCR  NEGATIVE NEGATIVE Final    Comment: (NOTE) The Xpert Xpress SARS-CoV-2/FLU/RSV plus assay is intended as an aid in the diagnosis of influenza from Nasopharyngeal swab specimens and should not be used as a sole basis for treatment. Nasal washings and aspirates are unacceptable for Xpert Xpress SARS-CoV-2/FLU/RSV testing.  Fact Sheet for Patients: EntrepreneurPulse.com.au  Fact Sheet for Healthcare Providers: IncredibleEmployment.be  This test is not yet approved or cleared by the [place] FDA and has been authorized for detection and/or diagnosis of SARS-CoV-2 by FDA under an Emergency Use Authorization (EUA). This EUA will remain in effect (meaning this test can be used) for the duration of the COVID-19 declaration under Section 564(b)(1) of the Act, 21 U.S.C. section  360bbb-3(b)(1), unless the authorization is terminated or revoked.  Performed at [facility]          Radiology Studies: DG Hip Unilat With Pelvis 2-3 Views Left  Result Date: 05/18/2020 CLINICAL DATA:  LEFT side pain post fall EXAM: DG HIP (WITH OR WITHOUT PELVIS) 2-3V LEFT COMPARISON:  None FINDINGS: Osseous demineralization. Hip and SI joint spaces preserved. Mildly displaced intertrochanteric fracture LEFT femur. No dislocation. Pelvis intact. Brachytherapy seed implants at prostate gland. IMPRESSION: Mildly displaced intertrochanteric fracture LEFT femur. Electronically Signed   By: Lavonia Dana M.D.   On: 05/18/2020 21:54   DG FEMUR MIN 2 VIEWS LEFT  Result Date: 05/18/2020 CLINICAL DATA:  LEFT hip pain EXAM: LEFT FEMUR 2 VIEWS COMPARISON:  LEFT hip radiographs 05/18/2020 FINDINGS: Osseous demineralization. Joint space narrowing LEFT knee. Minimally displaced intertrochanteric fracture LEFT femur. No additional fracture or dislocation identified. Brachytherapy seed implants at prostate bed. Mild soft tissue swelling laterally at the LEFT hip region.  IMPRESSION: Minimally displaced intertrochanteric fracture LEFT femur. Electronically Signed   By: Lavonia Dana M.D.   On: 05/18/2020 21:55        Scheduled Meds: . carbidopa-levodopa  2 tablet Oral TID  . chlorhexidine  60 mL Topical Once  . cholecalciferol  1,000 Units Oral Daily  . diltiazem  120 mg Oral Daily  . feeding supplement  237 mL Oral BID BM  . gabapentin  300 mg Oral TID  . multivitamin with minerals  1 tablet Oral Daily  . oxyCODONE  10 mg Oral Q12H  . povidone-iodine  2 application Topical Once  . rosuvastatin  10 mg Oral Q2000  . senna-docusate  1 tablet Oral BID   Continuous Infusions: . sodium chloride    . [START ON 05/20/2020]  ceFAZolin (ANCEF) IV    . heparin    . [START ON 05/20/2020] tranexamic acid       LOS: 0 days    Time spent:40 min    Jakaylah Schlafer, Geraldo Docker, MD [facility]   If 7PM-7AM, please contact night-coverage 05/19/2020, 4:54 PM

## 2020-05-19 NOTE — ED Notes (Signed)
REPORT CALLED TO 6 EAST , ROOM WAS CHANGED TO 1616.   PATIENT GIVEN  4MG  OF MORPHINE PRIOR TO TRANSPORT

## 2020-05-19 NOTE — Telephone Encounter (Signed)
I tried calling. Went to [facility]. Sad to hear that he had fracture. I will be available if Dr. Delfino Lovett would need any input from me before the surgery.  Thanks MJP

## 2020-05-19 NOTE — H&P (View-Only) (Signed)
Chief Complaint: Left hip fracture History: [name] is a 85 y.o. male, with PMH of CKD 3, A. fib on Eliquis, Parkinson's, hypertension, CAD, dyslipidemia who presented to the ER on 05/18/2020 with hip pain after ground-level fall.  Patient states he was in his kitchen when he leaned over to pick up Ice Cube on the floor and he fell on his left hip.  He had immediate pain.  He was unable to get up.  He did not hit his head or lose consciousness.  No neck pain.  He did not take any pain medication prior to arrival. His wife witnessed the fall.  Because he could not get up and he was in significant pain, he was brought into the ER.   He denies previous history of falls.  He said he is compliant with his home medications, on Eliquis for his A. Fib.  Review of systems: Patient denies nausea, vomiting, emesis.  No shortness of breath, no loss of consciousness.  No recent fevers, chills.  Past Medical History:  Diagnosis Date  . Arthritis    "knees; lower back" (08/09/2016)  . Atrial fibrillation ([place])    remote hx/notes 08/07/2016  . Coronary artery disease   . Episodes of trembling    "most of the time it's my left hand" (08/09/2016)  . GI bleeding 08/2017  . High cholesterol   . History of kidney stones   . Hypertension   . Melanoma of shoulder ([place]) 2000s   "left"  . Mitral regurgitation and mitral stenosis    hx/notes 08/07/2016  . Moderate aortic stenosis    hx/notes 08/07/2016  . Parkinson's disease ([place])   . Prostate cancer ([employer]) 2011   hx/notes 08/07/2016    Allergies  Allergen Reactions  . Nsaids Other (See Comments)    Cannot have any of these because he is currently taking Eliquis    No current facility-administered medications on file prior to encounter.   Current Outpatient Medications on File Prior to Encounter  Medication Sig Dispense Refill  . apixaban (ELIQUIS) 2.5 MG TABS tablet Take 1 tablet (2.5 mg total) by mouth 2 (two) times daily. (0800  & 2000) 60 tablet 6  . carbidopa-levodopa (SINEMET IR) 25-100 MG tablet TAKE 2 TABLETS BY MOUTH 3 (THREE) TIMES DAILY. 540 tablet 1  . Cholecalciferol (VITAMIN D-3) 1000 units CAPS Take 1,000 Units by mouth daily. (0800)    . dapagliflozin propanediol (FARXIGA) 10 MG TABS tablet Take 1 tablet by mouth daily.    [name] diltiazem (CARDIZEM CD) 120 MG 24 hr capsule Take 1 capsule (120 mg total) by mouth daily. 90 capsule 3  . furosemide (LASIX) 80 MG tablet Take 1 tablet (80 mg total) by mouth as directed. 2 pills in morning, 1 pill in afternoon (Patient taking differently: Take 80 mg by mouth in the morning, at noon, and at bedtime.) 270 tablet 3  . gabapentin (NEURONTIN) 300 MG capsule Take 1 capsule (300 mg total) by mouth 3 (three) times daily. 270 capsule 1  . metolazone (ZAROXOLYN) 2.5 MG tablet Take 2.5 mg by mouth once a week.    . Multiple Vitamins-Minerals (PRESERVISION AREDS 2 PO) Take 1 tablet by mouth 2 (two) times daily. (0800 & 2000)    . nitroGLYCERIN (NITROSTAT) 0.4 MG SL tablet TAKE 1 TABLET EVERY 5 MINUTES AS NEEDED FOR CHEST PAIN 25 tablet 0  . polyethylene glycol (MIRALAX / GLYCOLAX) packet Take 17 g by mouth 2 (two) times daily. (Patient  taking differently: Take 17 g by mouth as needed for mild constipation.) 14 each 0  . potassium chloride SA (KLOR-CON) 20 MEQ tablet Take 20 mEq by mouth 2 (two) times daily.    . rosuvastatin (CRESTOR) 10 MG tablet Take 10 mg by mouth daily at 8 pm. (2000)      Physical Exam: Vitals:   05/19/20 0048 05/19/20 0511  BP: 127/80 121/74  Pulse: 84 73  Resp: 18 17  Temp: 97.8 F (36.6 C) 98.3 F (36.8 C)  SpO2: 90% 94%   Body mass index is 28.15 kg/m. Alert and oriented x3. No shortness of breath or chest pain. Abdomen soft and nontender.  No rebound tenderness Full range of motion in the upper extremity.  No gross crepitus deformity or pain with joint range of motion. Left lower extremity: EHL/tibialis anterior/gastrocnemius intact.  Intact  sensation to light touch throughout the lower extremity.  No knee or ankle pain with isolated joint range of motion.  Significant groin pain with palpation in the attempted range of motion.  No obvious laceration or abrasion over the left hip. Right lower extremity: Asymptomatic.  No pain with passive range of motion.  No focal neurological deficit on motor or sensory testing Compartments in the lower extremity are soft and nontender.  2+ dorsalis pedis/posterior tibialis pulses bilaterally  Image: DG Hip Unilat With Pelvis 2-3 Views Left  Result Date: 05/18/2020 CLINICAL DATA:  LEFT side pain post fall EXAM: DG HIP (WITH OR WITHOUT PELVIS) 2-3V LEFT COMPARISON:  None FINDINGS: Osseous demineralization. Hip and SI joint spaces preserved. Mildly displaced intertrochanteric fracture LEFT femur. No dislocation. Pelvis intact. Brachytherapy seed implants at prostate gland. IMPRESSION: Mildly displaced intertrochanteric fracture LEFT femur. Electronically Signed   By: Lavonia Dana M.D.   On: 05/18/2020 21:54   DG FEMUR MIN 2 VIEWS LEFT  Result Date: 05/18/2020 CLINICAL DATA:  LEFT hip pain EXAM: LEFT FEMUR 2 VIEWS COMPARISON:  LEFT hip radiographs 05/18/2020 FINDINGS: Osseous demineralization. Joint space narrowing LEFT knee. Minimally displaced intertrochanteric fracture LEFT femur. No additional fracture or dislocation identified. Brachytherapy seed implants at prostate bed. Mild soft tissue swelling laterally at the LEFT hip region. IMPRESSION: Minimally displaced intertrochanteric fracture LEFT femur. Electronically Signed   By: Lavonia Dana M.D.   On: 05/18/2020 21:55    A/P: [name] is a very pleasant 85 year old gentleman who unfortunately fell and has a three-part left intertrochanteric hip fracture.  There is no dislocation noted.  Patient is neurovascularly intact.  At this point time he has been admitted to the medical service for preoperative maximization.  I will discuss definitive management  fracture care with my partner.  Full recommendations as to timing for surgery are forthcoming.  Patient should remain n.p.o.  ADDENDUM: Dr. Rolena Infante has reviewed images and spoken with Dr. Lyla Glassing who has agreed to operate on  Patient tomorrow. Patient may eat today and be made NPO at midnight.

## 2020-05-19 NOTE — Consult Note (Cosign Needed Addendum)
Chief Complaint: Left hip fracture History: [name] is a 85 y.o. male, with PMH of CKD 3, A. fib on Eliquis, Parkinson's, hypertension, CAD, dyslipidemia who presented to the ER on 05/18/2020 with hip pain after ground-level fall.  Patient states he was in his kitchen when he leaned over to pick up Ice Cube on the floor and he fell on his left hip.  He had immediate pain.  He was unable to get up.  He did not hit his head or lose consciousness.  No neck pain.  He did not take any pain medication prior to arrival. His wife witnessed the fall.  Because he could not get up and he was in significant pain, he was brought into the ER.   He denies previous history of falls.  He said he is compliant with his home medications, on Eliquis for his A. Fib.  Review of systems: Patient denies nausea, vomiting, emesis.  No shortness of breath, no loss of consciousness.  No recent fevers, chills.  Past Medical History:  Diagnosis Date  . Arthritis    "knees; lower back" (08/09/2016)  . Atrial fibrillation ([place])    remote hx/notes 08/07/2016  . Coronary artery disease   . Episodes of trembling    "most of the time it's my left hand" (08/09/2016)  . GI bleeding 08/2017  . High cholesterol   . History of kidney stones   . Hypertension   . Melanoma of shoulder ([place]) 2000s   "left"  . Mitral regurgitation and mitral stenosis    hx/notes 08/07/2016  . Moderate aortic stenosis    hx/notes 08/07/2016  . Parkinson's disease ([place])   . Prostate cancer ([place]) 2011   hx/notes 08/07/2016    Allergies  Allergen Reactions  . Nsaids Other (See Comments)    Cannot have any of these because he is currently taking Eliquis    No current facility-administered medications on file prior to encounter.   Current Outpatient Medications on File Prior to Encounter  Medication Sig Dispense Refill  . apixaban (ELIQUIS) 2.5 MG TABS tablet Take 1 tablet (2.5 mg total) by mouth 2 (two) times daily. (0800  & 2000) 60 tablet 6  . carbidopa-levodopa (SINEMET IR) 25-100 MG tablet TAKE 2 TABLETS BY MOUTH 3 (THREE) TIMES DAILY. 540 tablet 1  . Cholecalciferol (VITAMIN D-3) 1000 units CAPS Take 1,000 Units by mouth daily. (0800)    . dapagliflozin propanediol (FARXIGA) 10 MG TABS tablet Take 1 tablet by mouth daily.    [name] diltiazem (CARDIZEM CD) 120 MG 24 hr capsule Take 1 capsule (120 mg total) by mouth daily. 90 capsule 3  . furosemide (LASIX) 80 MG tablet Take 1 tablet (80 mg total) by mouth as directed. 2 pills in morning, 1 pill in afternoon (Patient taking differently: Take 80 mg by mouth in the morning, at noon, and at bedtime.) 270 tablet 3  . gabapentin (NEURONTIN) 300 MG capsule Take 1 capsule (300 mg total) by mouth 3 (three) times daily. 270 capsule 1  . metolazone (ZAROXOLYN) 2.5 MG tablet Take 2.5 mg by mouth once a week.    . Multiple Vitamins-Minerals (PRESERVISION AREDS 2 PO) Take 1 tablet by mouth 2 (two) times daily. (0800 & 2000)    . nitroGLYCERIN (NITROSTAT) 0.4 MG SL tablet TAKE 1 TABLET EVERY 5 MINUTES AS NEEDED FOR CHEST PAIN 25 tablet 0  . polyethylene glycol (MIRALAX / GLYCOLAX) packet Take 17 g by mouth 2 (two) times daily. (Patient  taking differently: Take 17 g by mouth as needed for mild constipation.) 14 each 0  . potassium chloride SA (KLOR-CON) 20 MEQ tablet Take 20 mEq by mouth 2 (two) times daily.    . rosuvastatin (CRESTOR) 10 MG tablet Take 10 mg by mouth daily at 8 pm. (2000)      Physical Exam: Vitals:   05/19/20 0048 05/19/20 0511  BP: 127/80 121/74  Pulse: 84 73  Resp: 18 17  Temp: 97.8 F (36.6 C) 98.3 F (36.8 C)  SpO2: 90% 94%   Body mass index is 28.15 kg/m. Alert and oriented x3. No shortness of breath or chest pain. Abdomen soft and nontender.  No rebound tenderness Full range of motion in the upper extremity.  No gross crepitus deformity or pain with joint range of motion. Left lower extremity: EHL/tibialis anterior/gastrocnemius intact.  Intact  sensation to light touch throughout the lower extremity.  No knee or ankle pain with isolated joint range of motion.  Significant groin pain with palpation in the attempted range of motion.  No obvious laceration or abrasion over the left hip. Right lower extremity: Asymptomatic.  No pain with passive range of motion.  No focal neurological deficit on motor or sensory testing Compartments in the lower extremity are soft and nontender.  2+ dorsalis pedis/posterior tibialis pulses bilaterally  Image: DG Hip Unilat With Pelvis 2-3 Views Left  Result Date: 05/18/2020 CLINICAL DATA:  LEFT side pain post fall EXAM: DG HIP (WITH OR WITHOUT PELVIS) 2-3V LEFT COMPARISON:  None FINDINGS: Osseous demineralization. Hip and SI joint spaces preserved. Mildly displaced intertrochanteric fracture LEFT femur. No dislocation. Pelvis intact. Brachytherapy seed implants at prostate gland. IMPRESSION: Mildly displaced intertrochanteric fracture LEFT femur. Electronically Signed   By: Lavonia Dana M.D.   On: 05/18/2020 21:54   DG FEMUR MIN 2 VIEWS LEFT  Result Date: 05/18/2020 CLINICAL DATA:  LEFT hip pain EXAM: LEFT FEMUR 2 VIEWS COMPARISON:  LEFT hip radiographs 05/18/2020 FINDINGS: Osseous demineralization. Joint space narrowing LEFT knee. Minimally displaced intertrochanteric fracture LEFT femur. No additional fracture or dislocation identified. Brachytherapy seed implants at prostate bed. Mild soft tissue swelling laterally at the LEFT hip region. IMPRESSION: Minimally displaced intertrochanteric fracture LEFT femur. Electronically Signed   By: Lavonia Dana M.D.   On: 05/18/2020 21:55    A/P: [name] is a very pleasant 85 year old gentleman who unfortunately fell and has a three-part left intertrochanteric hip fracture.  There is no dislocation noted.  Patient is neurovascularly intact.  At this point time he has been admitted to the medical service for preoperative maximization.  I will discuss definitive management  fracture care with my partner.  Full recommendations as to timing for surgery are forthcoming.  Patient should remain n.p.o.  ADDENDUM: Dr. Rolena Infante has reviewed images and spoken with Dr. Lyla Glassing who has agreed to operate on  Patient tomorrow. Patient may eat today and be made NPO at midnight.

## 2020-05-19 NOTE — ED Notes (Signed)
Vital signs stable. 

## 2020-05-19 NOTE — Progress Notes (Addendum)
ANTICOAGULATION CONSULT NOTE - Initial Consult  Pharmacy Consult for heparin Indication: atrial fibrillation; apixaban on hold  Allergies  Allergen Reactions  . Nsaids Other (See Comments)    Cannot have any of these because he is currently taking Eliquis    Patient Measurements: Height: 5\' 10"  (177.8 cm) Weight: 89 kg (196 lb 3.4 oz) IBW/kg (Calculated) : 73 Heparin Dosing Weight = TBW = 89 kg  Vital Signs: Temp: 98.1 F (36.7 C) (03/22 1433) Temp Source: Oral (03/22 1433) BP: 127/64 (03/22 1433) Pulse Rate: 76 (03/22 1433)  Labs: Recent Labs    05/18/20 2049 05/19/20 0624  HGB 15.8 14.9  HCT 48.8 46.0  PLT 169 148*  CREATININE 1.90* 1.84*    Estimated Creatinine Clearance: 34.8 mL/min (A) (by C-G formula based on SCr of 1.84 mg/dL (H)).   Medical History: Past Medical History:  Diagnosis Date  . Arthritis    "knees; lower back" (08/09/2016)  . Atrial fibrillation ([place])    remote hx/notes 08/07/2016  . Coronary artery disease   . Episodes of trembling    "most of the time it's my left hand" (08/09/2016)  . GI bleeding 08/2017  . High cholesterol   . History of kidney stones   . Hypertension   . Melanoma of shoulder ([place]) 2000s   "left"  . Mitral regurgitation and mitral stenosis    hx/notes 08/07/2016  . Moderate aortic stenosis    hx/notes 08/07/2016  . Parkinson's disease ([place])   . Prostate cancer ([place]) 2011   hx/notes 08/07/2016    Medications: Apixaban 2.5 mg PO BID PTA Last dose on 3/21 @ 0800  Assessment: Pt is an 75 yoM admitted with fall resulting in left hip fracture. Apixaban is currently being held for possible surgical repair. Pharmacy consulted to dose heparin while apixaban being held pre-op.   Today, 05/19/20  CBC: Hgb WNL; Plt 148  SCr 1.84, CrCl ~35 mL/min  STAT HL and APTT ordered; anticipate HL to be falsely elevated due to recent DOAC  Goal of Therapy:  Heparin level 0.3-0.7 units/ml aPTT 66-102 seconds Monitor platelets  by anticoagulation protocol: Yes   Plan:   Initiate heparin infusion at 1300 units/hr  Check APTT/HL in 8 hours  CBC, HL/APTT daily while on heparin infusion. Monitoring using aPTT until aPTT and HL correlate  Decision on when to turn off heparin drip prior to surgery to be determined by ortho  Lenis Noon, PharmD 05/19/2020,4:32 PM   Addendum:  Discussed with Dr. Lyla Glassing - heparin drip will be turned off at 0500 on 3/23 prior to surgery.   Lenis Noon, PharmD 05/19/20 5:33 PM

## 2020-05-19 NOTE — Telephone Encounter (Signed)
Patient gf [name] is aware.

## 2020-05-19 NOTE — H&P (Signed)
History and Physical  Patient Name: [name]     CVE:[id number]    DOB: Feb 13, 1936    DOA: 05/18/2020 PCP: Carol Ada, MD  Patient coming from: Home  Chief Complaint: Fall, left hip pain    HPI: [name] is a 85 y.o. male, with PMH of CKD 3, A. fib on Eliquis, Parkinson's, hypertension, CAD, dyslipidemia who presented to the ER on 05/18/2020 with hip pain after ground-level fall.  Patient states he was in his kitchen when he leaned over to pick up Ice Cube on the floor and he fell on his left hip.  He had immediate pain.  He was unable to get up.  He did not hit his head or lose consciousness.  No neck pain.  He did not take any pain medication prior to arrival. His wife witnessed the fall.  Because he could not get up and he was in significant pain, he was brought into the ER.   He denies previous history of falls.  He said he is compliant with his home medications, on Eliquis for his A. fib.    ED course: -Vitals on admission: Febrile, heart rate 52, respiratory rate 20, blood pressure 149/76, maintaining sats on room air -Labs on initial presentation: 138, potassium 2.9, chloride 86, bicarb 37, glucose 105, BUN 39, creatinine 1.9, WBC 5.7, hemoglobin 15.8, Covid negative -Imaging obtained on admission: X-ray showed left hip fracture -In the ED the patient was given morphine, potassium supplementation.  Orthopedics was contacted and recommended transfer for surgery and have. the hospitalist service was contacted for further evaluation and management.     ROS: A complete and thorough 12 point review of systems obtained, negative listed in HPI.     Past Medical History:  Diagnosis Date  . Arthritis    "knees; lower back" (08/09/2016)  . Atrial fibrillation ([place])    remote hx/notes 08/07/2016  . Coronary artery disease   . Episodes of trembling    "most of the time it's my left hand" (08/09/2016)  . GI bleeding 08/2017  . High cholesterol   .  History of kidney stones   . Hypertension   . Melanoma of shoulder ([place]) 2000s   "left"  . Mitral regurgitation and mitral stenosis    hx/notes 08/07/2016  . Moderate aortic stenosis    hx/notes 08/07/2016  . Parkinson's disease ([place])   . Prostate cancer ([place]) 2011   hx/notes 08/07/2016    Past Surgical History:  Procedure Laterality Date  . CARDIAC CATHETERIZATION  ~ [address], [place]"  . CARDIOVERSION N/A 01/26/2017   Procedure: CARDIOVERSION;  Surgeon: [name], MD;  Location: [facility];  Service: Cardiovascular;  Laterality: N/A;  . CARDIOVERSION N/A 03/06/2018   Procedure: CARDIOVERSION;  Surgeon: [name], MD;  Location: [facility];  Service: Cardiovascular;  Laterality: N/A;  . CATARACT EXTRACTION W/ INTRAOCULAR LENS  IMPLANT, BILATERAL Bilateral 2014-02/2016   left-right; hx/notes 08/07/2016  . COLONOSCOPY WITH PROPOFOL N/A 09/25/2017   Procedure: COLONOSCOPY WITH PROPOFOL;  Surgeon: Laurence Spates, MD;  Location: [facility];  Service: Endoscopy;  Laterality: N/A;  . CORONARY ANGIOPLASTY WITH STENT PLACEMENT  08/09/2016  . CORONARY ATHERECTOMY N/A 08/09/2016   Procedure: Coronary Atherectomy;  Surgeon: Adrian Prows, MD;  Location: [facility];  Service: Cardiovascular;  Laterality: N/A;  . CORONARY STENT INTERVENTION N/A 08/09/2016   Procedure: Coronary Stent Intervention;  Surgeon: Adrian Prows, MD;  Location: [facility] [facility];  Service: Cardiovascular;  Laterality: N/A;  LAD  . EYE SURGERY Bilateral 05/2016   "laser on both lenses"  . HEMORRHOID SURGERY N/A 09/27/2017   Procedure: HEMORRHOIDECTOMY;  Surgeon: Erroll Luna, MD;  Location: [facility];  Service: General;  Laterality: N/A;  . INGUINAL HERNIA REPAIR Left 1991  . INSERTION PROSTATE RADIATION SEED  06/25/2009  . INTRAVASCULAR PRESSURE WIRE/FFR STUDY N/A 08/09/2016   Procedure: Intravascular Pressure Wire/FFR Study;  Surgeon: Adrian Prows, MD;  Location: [facility];  Service:  Cardiovascular;  Laterality: N/A;  . LAPAROSCOPIC CHOLECYSTECTOMY  1993  . MELANOMA EXCISION Left 1990s   "shoulder"  . MELANOMA EXCISION Right 08/2018   right shoulder  . RIGHT HEART CATH N/A 03/20/2018   Procedure: RIGHT HEART CATH;  Surgeon: [name], MD;  Location: [facility];  Service: Cardiovascular;  Laterality: N/A;  . RIGHT/LEFT HEART CATH AND CORONARY ANGIOGRAPHY N/A 08/09/2016   Procedure: Right/Left Heart Cath and Coronary Angiography;  Surgeon: Adrian Prows, MD;  Location: [facility];  Service: Cardiovascular;  Laterality: N/A;  . TEE WITHOUT CARDIOVERSION N/A 01/25/2017   Procedure: TRANSESOPHAGEAL ECHOCARDIOGRAM (TEE);  Surgeon: [name], MD;  Location: [facility];  Service: Cardiovascular;  Laterality: N/A;    Social History: Patient lives at home.  The patient walks without assistance.  Non smoker.  Allergies  Allergen Reactions  . Nsaids Other (See Comments)    Cannot have any of these because he is currently taking Eliquis    Family history: family history includes Cerebral aneurysm in his sister; Healthy in his daughter; Heart attack in his father.  Prior to Admission medications   Medication Sig Start Date End Date Taking? Authorizing Provider  apixaban (ELIQUIS) 2.5 MG TABS tablet Take 1 tablet (2.5 mg total) by mouth 2 (two) times daily. (0800 & 2000) 04/03/20   Patwardhan, Manish J, MD  carbidopa-levodopa (SINEMET IR) 25-100 MG tablet TAKE 2 TABLETS BY MOUTH 3 (THREE) TIMES DAILY. 05/08/20   Tat, Eustace Quail, DO  Cholecalciferol (VITAMIN D-3) 1000 units CAPS Take 1,000 Units by mouth daily. (0800)    [provider]  dapagliflozin propanediol (FARXIGA) 10 MG TABS tablet Take 1 tablet by mouth daily.    [provider]  diltiazem (CARDIZEM CD) 120 MG 24 hr capsule Take 1 capsule (120 mg total) by mouth daily. 11/11/19 02/09/20  Patwardhan, Reynold Bowen, MD  furosemide (LASIX) 80 MG tablet Take 1 tablet (80 mg total)  by mouth as directed. 2 pills in morning, 1 pill in afternoon 02/19/20   Patwardhan, Manish J, MD  gabapentin (NEURONTIN) 300 MG capsule Take 1 capsule (300 mg total) by mouth 3 (three) times daily. 05/05/20   Tat, Eustace Quail, DO  metolazone (ZAROXOLYN) 2.5 MG tablet Take 2.5 mg by mouth once a week.    [provider]  Multiple Vitamins-Minerals (PRESERVISION AREDS 2 PO) Take 1 tablet by mouth 2 (two) times daily. (0800 & 2000)    [provider]  nitroGLYCERIN (NITROSTAT) 0.4 MG SL tablet TAKE 1 TABLET EVERY 5 MINUTES AS NEEDED FOR CHEST PAIN 11/14/18   Adrian Prows, MD  polyethylene glycol (MIRALAX / GLYCOLAX) packet Take 17 g by mouth 2 (two) times daily. Patient taking differently: Take 17 g by mouth as needed. 09/29/17   Hosie Poisson, MD  potassium chloride (KLOR-CON) 20 MEQ packet Take by mouth 2 (two) times daily.    [provider]  rosuvastatin (CRESTOR) 10 MG tablet Take 10 mg by mouth daily at 8  pm. (2000)    [provider]       Physical Exam: BP 127/80 (BP Location: Right Arm)   Pulse 84   Temp 97.8 F (36.6 C) (Oral)   Resp 18   Ht 5\' 10"  (1.778 m)   Wt 89 kg   SpO2 90%   BMI 28.15 kg/m   General appearance: Well-developed, adult male, alert and in no acute distress .   Eyes: Anicteric, conjunctiva pink, lids and lashes normal. PERRL.    ENT: No nasal deformity, discharge, epistaxis.  Hearing intact. OP moist without lesions.   Neck: No neck masses.  Trachea midline.  No thyromegaly/tenderness. Lymph: No cervical or supraclavicular lymphadenopathy. Skin: Warm and dry.  No jaundice.  No suspicious rashes or lesions. Cardiac: RRR, nl S1-S2, no murmurs appreciated.    Trace LE edema.  Radial and pedal pulses 2+ and symmetric. Respiratory: Normal respiratory rate and rhythm.  CTAB without rales or wheezes. Abdomen: Abdomen soft.  No tenderness with palpation. No ascites, distension, hepatosplenomegaly.   MSK: No deformities or effusions of  the large joints of the upper or lower extremities bilaterally.  No cyanosis or clubbing.  Did not attempt to move his left hip. Neuro: Cranial nerves 2 through 12 grossly intact.  Sensation intact to light touch. Speech is fluent.  [name]    Psych: Sensorium intact and responding to questions, attention normal.  Behavior appropriate.  Judgment and insight appear normal.    Labs on Admission:  I have personally reviewed following labs and imaging studies: CBC: Recent Labs  Lab 05/18/20 2049  WBC 5.7  NEUTROABS 4.1  HGB 15.8  HCT 48.8  MCV 90.5  PLT 270   Basic Metabolic Panel: Recent Labs  Lab 05/18/20 2049  NA 138  K 2.9*  CL 86*  CO2 37*  GLUCOSE 105*  BUN 39*  CREATININE 1.90*  CALCIUM 9.9   GFR: Estimated Creatinine Clearance: 33.7 mL/min (A) (by C-G formula based on SCr of 1.9 mg/dL (H)).  Liver Function Tests: No results for input(s): AST, ALT, ALKPHOS, BILITOT, PROT, ALBUMIN in the last 168 hours. No results for input(s): LIPASE, AMYLASE in the last 168 hours. No results for input(s): AMMONIA in the last 168 hours. Coagulation Profile: No results for input(s): INR, PROTIME in the last 168 hours. Cardiac Enzymes: No results for input(s): CKTOTAL, CKMB, CKMBINDEX, TROPONINI in the last 168 hours. BNP (last 3 results) Recent Labs    10/10/19 1037 11/01/19 1322  PROBNP 1,547* 2,160*   HbA1C: No results for input(s): HGBA1C in the last 72 hours. CBG: No results for input(s): GLUCAP in the last 168 hours. Lipid Profile: No results for input(s): CHOL, HDL, LDLCALC, TRIG, CHOLHDL, LDLDIRECT in the last 72 hours. Thyroid Function Tests: No results for input(s): TSH, T4TOTAL, FREET4, T3FREE, THYROIDAB in the last 72 hours. Anemia Panel: No results for input(s): VITAMINB12, FOLATE, FERRITIN, TIBC, IRON, RETICCTPCT in the last 72 hours.   Recent Results (from the past 240 hour(s))  Resp Panel by RT-PCR (Flu A&B, Covid) Nasopharyngeal Swab     Status: None    Collection Time: 05/18/20 10:49 PM   Specimen: Nasopharyngeal Swab; Nasopharyngeal(NP) swabs in vial transport medium  Result Value Ref Range Status   SARS Coronavirus 2 by RT PCR NEGATIVE NEGATIVE Final    Comment: (NOTE) SARS-CoV-2 target nucleic acids are NOT DETECTED.  The SARS-CoV-2 RNA is generally detectable in upper respiratory specimens during the acute phase of infection. The lowest concentration of SARS-CoV-2 viral  copies this assay can detect is 138 copies/mL. A negative result does not preclude SARS-Cov-2 infection and should not be used as the sole basis for treatment or other patient management decisions. A negative result may occur with  improper specimen collection/handling, submission of specimen other than nasopharyngeal swab, presence of viral mutation(s) within the areas targeted by this assay, and inadequate number of viral copies(<138 copies/mL). A negative result must be combined with clinical observations, patient history, and epidemiological information. The expected result is Negative.  Fact Sheet for Patients:  EntrepreneurPulse.com.au  Fact Sheet for Healthcare Providers:  IncredibleEmployment.be  This test is no t yet approved or cleared by the [place] FDA and  has been authorized for detection and/or diagnosis of SARS-CoV-2 by FDA under an Emergency Use Authorization (EUA). This EUA will remain  in effect (meaning this test can be used) for the duration of the COVID-19 declaration under Section 564(b)(1) of the Act, 21 U.S.C.section 360bbb-3(b)(1), unless the authorization is terminated  or revoked sooner.       Influenza A by PCR NEGATIVE NEGATIVE Final   Influenza B by PCR NEGATIVE NEGATIVE Final    Comment: (NOTE) The Xpert Xpress SARS-CoV-2/FLU/RSV plus assay is intended as an aid in the diagnosis of influenza from Nasopharyngeal swab specimens and should not be used as a sole basis for treatment.  Nasal washings and aspirates are unacceptable for Xpert Xpress SARS-CoV-2/FLU/RSV testing.  Fact Sheet for Patients: EntrepreneurPulse.com.au  Fact Sheet for Healthcare Providers: IncredibleEmployment.be  This test is not yet approved or cleared by the [place] FDA and has been authorized for detection and/or diagnosis of SARS-CoV-2 by FDA under an Emergency Use Authorization (EUA). This EUA will remain in effect (meaning this test can be used) for the duration of the COVID-19 declaration under Section 564(b)(1) of the Act, 21 U.S.C. section 360bbb-3(b)(1), unless the authorization is terminated or revoked.  Performed at [facility]            Radiological Exams on Admission: Personally reviewed imaging which shows: Fracture of left hip DG Hip Unilat With Pelvis 2-3 Views Left  Result Date: 05/18/2020 CLINICAL DATA:  LEFT side pain post fall EXAM: DG HIP (WITH OR WITHOUT PELVIS) 2-3V LEFT COMPARISON:  None FINDINGS: Osseous demineralization. Hip and SI joint spaces preserved. Mildly displaced intertrochanteric fracture LEFT femur. No dislocation. Pelvis intact. Brachytherapy seed implants at prostate gland. IMPRESSION: Mildly displaced intertrochanteric fracture LEFT femur. Electronically Signed   By: Lavonia Dana M.D.   On: 05/18/2020 21:54   DG FEMUR MIN 2 VIEWS LEFT  Result Date: 05/18/2020 CLINICAL DATA:  LEFT hip pain EXAM: LEFT FEMUR 2 VIEWS COMPARISON:  LEFT hip radiographs 05/18/2020 FINDINGS: Osseous demineralization. Joint space narrowing LEFT knee. Minimally displaced intertrochanteric fracture LEFT femur. No additional fracture or dislocation identified. Brachytherapy seed implants at prostate bed. Mild soft tissue swelling laterally at the LEFT hip region. IMPRESSION: Minimally displaced intertrochanteric fracture LEFT femur. Electronically Signed   By: Lavonia Dana M.D.   On: 05/18/2020 21:55           Assessment/Plan   1.  Left hip fracture -Imaging shows left intertrochanter fracture -Orthopedics consulted in the ED, possibly surgery in the morning -N.p.o. -Fall precautions -PT and OT consulted -Pain control as warranted  2.  Atrial fibrillation, permanent -Continue home Cardizem for rate control -Hold home Eliquis in anticipation of surgery  3.  Hypokalemia -Likely in the setting of significant diuretic use -On arrival potassium 2.9 -40 mEq  given in ED, will give another 20 mEq now -Hold home diuretics for now -Follow-up labs ordered  4.  Parkinson's -Continue home Sinemet  5.  Ground-level fall -Plans as above for resultant hip fracture  6.  Chronic leg edema -Multifactorial including valvular heart disease, HFpEF, deficiency -Hold home diuretics for now given hypokalemia  7.  CKD 3B -Creatinine slightly above baseline on admission, 1.9.  Baseline appears around 1.6 -Avoid nephrotoxic agents -Follow-up labs ordered   DVT prophylaxis: On Eliquis, but holding for surgery Code Status: Full Family Communication: Patient Disposition Plan: Anticipate discharge home versus rehab when medically optimized Consults called: ER contact orthopedics Admission status: Inpatient     Medical decision making: Patient seen at 1:17 AM on 05/19/2020 What exists of the patient's chart was reviewed in depth and summarized above.  Clinical condition: Fair.        Doran Heater [facility] Please page though [place] or Epic secure chat:  For password, contact charge nurse

## 2020-05-19 NOTE — Plan of Care (Signed)
  Problem: Pain Managment: Goal: General experience of comfort will improve Description: Patient's general experience of comfort will improve by 05/22/20 Outcome: Progressing

## 2020-05-19 NOTE — Progress Notes (Signed)
Initial Nutrition Assessment  INTERVENTION:   -Ensure surgery po BID, each supplement provides 330 kcal and 18 grams of protein  -Multivitamin with minerals daily  NUTRITION DIAGNOSIS:   Increased nutrient needs related to hip fracture,post-op healing as evidenced by estimated needs.  GOAL:   Patient will meet greater than or equal to 90% of their needs  MONITOR:   PO intake,Supplement acceptance,Labs,Weight trends,I & O's  REASON FOR ASSESSMENT:   Malnutrition Screening Tool    ASSESSMENT:   85 y.o. male, with PMH of CKD 3, A. fib on Eliquis, Parkinson's, hypertension, CAD, dyslipidemia who presented to the ER on 05/18/2020 with hip pain after ground-level fall.  Patient in room with no visitors at bedside. Pt reports at home he typically eats only 1 meal a day. May have cereal in the morning around breakfast time but doesn't eat more than that. Pt reports he tries to watch his sodium intake and consumes lean meats and fish. Pt very aware of how diet affects his heart.   Pt is agreeable to trying protein shakes to increase his protein intake.   Per ortho note, plan is for hip surgery tomorrow.  Per weight records, pt has lost 22 lbs since 05/13/19 (10% wt loss x 1 year, insignificant for time frame).   Medications:Vitamin D, Senokot, IV KCl  Labs reviewed: Low K Elevated K (2.6), Phos (5.0)  NUTRITION - FOCUSED PHYSICAL EXAM:  Flowsheet Row Most Recent Value  Orbital Region No depletion  Upper Arm Region Mild depletion  Thoracic and Lumbar Region Unable to assess  Buccal Region No depletion  Temple Region Moderate depletion  Clavicle Bone Region Mild depletion  Clavicle and Acromion Bone Region Mild depletion  Scapular Bone Region Unable to assess  Dorsal Hand Mild depletion  Patellar Region Unable to assess  Anterior Thigh Region Unable to assess  Posterior Calf Region Unable to assess  Edema (RD Assessment) Severe       Diet Order:   Diet Order             Diet NPO time specified  Diet effective ____           Diet NPO time specified Except for: Sips with Meds  Diet effective midnight           Diet regular Room service appropriate? Yes; Fluid consistency: Thin  Diet effective now                 EDUCATION NEEDS:   Education needs have been addressed  Skin:  Skin Assessment: Reviewed RN Assessment  Last BM:  PTA  Height:   Ht Readings from Last 1 Encounters:  05/18/20 5\' 10"  (1.778 m)    Weight:   Wt Readings from Last 1 Encounters:  05/18/20 89 kg   BMI:  Body mass index is 28.15 kg/m.  Estimated Nutritional Needs:   Kcal:  2100-2300  Protein:  95-110g  Fluid:  2.1L/day  Clayton Bibles, MS, RD, LDN Inpatient Clinical Dietitian Contact information available via Amion

## 2020-05-19 NOTE — Telephone Encounter (Signed)
Patient girlfriend Tamsen Snider called and wanted to let you know patient is currently admitted at [facility] for fractured femur. Dr. Delfino Lovett with [facility] is assigned to make determination as to whether or not he will need surgery. She said if you have any questions for her, please give her a call.   [place]  [phone]

## 2020-05-19 NOTE — Progress Notes (Signed)
OT Cancellation Note  Patient Details Name: [name] MRN: [id number] DOB: Oct 25, 1935   Cancelled Treatment:    Reason Eval/Treat Not Completed: Patient not medically ready. Patient pending hip surgery, will see post op.   Delbert Phenix OT OT pager: [phone]   Rosemary Holms 05/19/2020, 8:23 AM

## 2020-05-19 NOTE — Progress Notes (Signed)
PT Cancellation Note  Patient Details Name: [name] MRN: [id number] DOB: 05-20-1935   Cancelled Treatment:    Reason Eval/Treat Not Completed: Medical issues which prohibited therapy (surgical repair of hip fracture pending. Will follow.)  Philomena Doheny PT 05/19/2020  Acute Rehabilitation Services Pager [phone] Office [phone]

## 2020-05-20 ENCOUNTER — Inpatient Hospital Stay (HOSPITAL_COMMUNITY): Payer: Medicare Other | Admitting: Certified Registered Nurse Anesthetist

## 2020-05-20 ENCOUNTER — Inpatient Hospital Stay (HOSPITAL_COMMUNITY): Payer: Medicare Other

## 2020-05-20 ENCOUNTER — Telehealth: Payer: Self-pay | Admitting: Neurology

## 2020-05-20 ENCOUNTER — Encounter (HOSPITAL_COMMUNITY)
Admission: EM | Disposition: A | Discharge status: Home Health | Payer: Self-pay | Source: Home / Self Care | Attending: Internal Medicine

## 2020-05-20 ENCOUNTER — Encounter (HOSPITAL_COMMUNITY): Payer: Self-pay | Admitting: Family Medicine

## 2020-05-20 DIAGNOSIS — S7292XA Unspecified fracture of left femur, initial encounter for closed fracture: Secondary | ICD-10-CM

## 2020-05-20 DIAGNOSIS — R52 Pain, unspecified: Secondary | ICD-10-CM

## 2020-05-20 HISTORY — PX: FEMUR IM NAIL: SHX1597

## 2020-05-20 LAB — CBC WITH DIFFERENTIAL/PLATELET
Abs Immature Granulocytes: 0.01 10*3/uL (ref 0.00–0.07)
Basophils Absolute: 0 10*3/uL (ref 0.0–0.1)
Basophils Relative: 1 %
Eosinophils Absolute: 0.1 10*3/uL (ref 0.0–0.5)
Eosinophils Relative: 2 %
HCT: 41.4 % (ref 39.0–52.0)
Hemoglobin: 13.4 g/dL (ref 13.0–17.0)
Immature Granulocytes: 0 %
Lymphocytes Relative: 9 %
Lymphs Abs: 0.6 10*3/uL — ABNORMAL LOW (ref 0.7–4.0)
MCH: 29.9 pg (ref 26.0–34.0)
MCHC: 32.4 g/dL (ref 30.0–36.0)
MCV: 92.4 fL (ref 80.0–100.0)
Monocytes Absolute: 0.6 10*3/uL (ref 0.1–1.0)
Monocytes Relative: 10 %
Neutro Abs: 5.2 10*3/uL (ref 1.7–7.7)
Neutrophils Relative %: 78 %
Platelets: 139 10*3/uL — ABNORMAL LOW (ref 150–400)
RBC: 4.48 MIL/uL (ref 4.22–5.81)
RDW: 14.6 % (ref 11.5–15.5)
WBC: 6.6 10*3/uL (ref 4.0–10.5)
nRBC: 0 % (ref 0.0–0.2)

## 2020-05-20 LAB — COMPREHENSIVE METABOLIC PANEL
ALT: <5 U/L (ref 0–44)
AST: 24 U/L (ref 15–41)
Albumin: 3.6 g/dL (ref 3.5–5.0)
Alkaline Phosphatase: 116 U/L (ref 38–126)
Anion gap: 13 (ref 5–15)
BUN: 36 mg/dL — ABNORMAL HIGH (ref 8–23)
CO2: 31 mmol/L (ref 22–32)
Calcium: 8.8 mg/dL — ABNORMAL LOW (ref 8.9–10.3)
Chloride: 93 mmol/L — ABNORMAL LOW (ref 98–111)
Creatinine, Ser: 1.83 mg/dL — ABNORMAL HIGH (ref 0.61–1.24)
GFR, Estimated: 36 mL/min — ABNORMAL LOW (ref >60)
Glucose, Bld: 116 mg/dL — ABNORMAL HIGH (ref 70–99)
Potassium: 3.1 mmol/L — ABNORMAL LOW (ref 3.5–5.1)
Sodium: 137 mmol/L (ref 135–145)
Total Bilirubin: 4.4 mg/dL — ABNORMAL HIGH (ref 0.3–1.2)
Total Protein: 6.8 g/dL (ref 6.5–8.1)

## 2020-05-20 LAB — APTT: aPTT: 42 seconds — ABNORMAL HIGH (ref 24–36)

## 2020-05-20 LAB — MAGNESIUM: Magnesium: 2.5 mg/dL — ABNORMAL HIGH (ref 1.7–2.4)

## 2020-05-20 LAB — SURGICAL PCR SCREEN
MRSA, PCR: NEGATIVE
Staphylococcus aureus: NEGATIVE

## 2020-05-20 LAB — PHOSPHORUS: Phosphorus: 3.6 mg/dL (ref 2.5–4.6)

## 2020-05-20 SURGERY — INSERTION, INTRAMEDULLARY ROD, FEMUR
Anesthesia: General | Site: Hip | Laterality: Left

## 2020-05-20 MED ORDER — FENTANYL CITRATE (PF) 100 MCG/2ML IJ SOLN
INTRAMUSCULAR | Status: AC
  Administered 2020-05-20: 50 ug via INTRAVENOUS
  Filled 2020-05-20: qty 2

## 2020-05-20 MED ORDER — MENTHOL 3 MG MT LOZG: 1.0000 | LOZENGE | OROMUCOSAL | Status: DC | PRN

## 2020-05-20 MED ORDER — ISOPROPYL ALCOHOL 70 % SOLN
Status: DC | PRN
  Administered 2020-05-20: 1 via TOPICAL

## 2020-05-20 MED ORDER — PHENOL 1.4 % MT LIQD: 1.0000 | OROMUCOSAL | Status: DC | PRN

## 2020-05-20 MED ORDER — METOCLOPRAMIDE HCL 5 MG PO TABS: 5.0000 mg | ORAL_TABLET | Freq: Three times a day (TID) | ORAL | Status: DC | PRN

## 2020-05-20 MED ORDER — OXYCODONE HCL 5 MG PO TABS
5.0000 mg | ORAL_TABLET | Freq: Once | ORAL | Status: DC | PRN
Start: 2020-05-20 — End: 2020-05-20

## 2020-05-20 MED ORDER — ACETAMINOPHEN 325 MG PO TABS
325.0000 mg | ORAL_TABLET | Freq: Four times a day (QID) | ORAL | Status: DC | PRN
Start: 2020-05-21 — End: 2020-05-24
  Administered 2020-05-21 (×2): 650 mg via ORAL
  Administered 2020-05-21: 325 mg via ORAL
  Administered 2020-05-22: 650 mg via ORAL
  Filled 2020-05-20 (×3): qty 2

## 2020-05-20 MED ORDER — ONDANSETRON HCL 4 MG/2ML IJ SOLN
INTRAMUSCULAR | Status: DC | PRN
  Administered 2020-05-20: 4 mg via INTRAVENOUS

## 2020-05-20 MED ORDER — CHLORHEXIDINE GLUCONATE 0.12 % MT SOLN
15.0000 mL | OROMUCOSAL | Status: AC
  Administered 2020-05-20: 15 mL via OROMUCOSAL

## 2020-05-20 MED ORDER — ISOPROPYL ALCOHOL 70 % SOLN
Status: AC
  Filled 2020-05-20: qty 480

## 2020-05-20 MED ORDER — OXYCODONE HCL 5 MG/5ML PO SOLN: 5.0000 mg | Freq: Once | ORAL | Status: DC | PRN

## 2020-05-20 MED ORDER — HYDROCODONE-ACETAMINOPHEN 5-325 MG PO TABS
1.0000 | ORAL_TABLET | ORAL | Status: DC | PRN
  Administered 2020-05-21: 1 via ORAL
  Filled 2020-05-20: qty 1

## 2020-05-20 MED ORDER — FENTANYL CITRATE (PF) 100 MCG/2ML IJ SOLN
25.0000 ug | INTRAMUSCULAR | Status: DC | PRN
  Administered 2020-05-20: 50 ug via INTRAVENOUS

## 2020-05-20 MED ORDER — ROCURONIUM BROMIDE 10 MG/ML (PF) SYRINGE
PREFILLED_SYRINGE | INTRAVENOUS | Status: DC | PRN
  Administered 2020-05-20: 70 mg via INTRAVENOUS

## 2020-05-20 MED ORDER — ONDANSETRON HCL 4 MG PO TABS: 4.0000 mg | ORAL_TABLET | Freq: Four times a day (QID) | ORAL | Status: DC | PRN

## 2020-05-20 MED ORDER — MORPHINE SULFATE (PF) 2 MG/ML IV SOLN
0.5000 mg | INTRAVENOUS | Status: DC | PRN
Start: 2020-05-20 — End: 2020-05-21

## 2020-05-20 MED ORDER — PHENYLEPHRINE 40 MCG/ML (10ML) SYRINGE FOR IV PUSH (FOR BLOOD PRESSURE SUPPORT)
PREFILLED_SYRINGE | INTRAVENOUS | Status: DC | PRN
  Administered 2020-05-20: 120 ug via INTRAVENOUS
  Administered 2020-05-20 (×2): 80 ug via INTRAVENOUS

## 2020-05-20 MED ORDER — METHOCARBAMOL 1000 MG/10ML IJ SOLN
500.0000 mg | Freq: Four times a day (QID) | INTRAVENOUS | Status: DC | PRN
  Filled 2020-05-20: qty 5

## 2020-05-20 MED ORDER — SODIUM CHLORIDE 0.9 % IR SOLN
Status: DC | PRN
  Administered 2020-05-20: 1000 mL

## 2020-05-20 MED ORDER — POTASSIUM CHLORIDE 10 MEQ/100ML IV SOLN
10.0000 meq | INTRAVENOUS | Status: AC
  Administered 2020-05-20 (×2): 10 meq via INTRAVENOUS
  Filled 2020-05-20: qty 100

## 2020-05-20 MED ORDER — METOCLOPRAMIDE HCL 5 MG/ML IJ SOLN: 5.0000 mg | Freq: Three times a day (TID) | INTRAMUSCULAR | Status: DC | PRN

## 2020-05-20 MED ORDER — PHENYLEPHRINE 40 MCG/ML (10ML) SYRINGE FOR IV PUSH (FOR BLOOD PRESSURE SUPPORT)
PREFILLED_SYRINGE | INTRAVENOUS | Status: AC
  Filled 2020-05-20: qty 10

## 2020-05-20 MED ORDER — MUPIROCIN 2 % EX OINT
1.0000 "application " | TOPICAL_OINTMENT | Freq: Two times a day (BID) | CUTANEOUS | Status: DC
  Administered 2020-05-20 – 2020-05-23 (×8): 1 via NASAL
  Filled 2020-05-20 (×2): qty 22

## 2020-05-20 MED ORDER — DEXAMETHASONE SODIUM PHOSPHATE 10 MG/ML IJ SOLN
INTRAMUSCULAR | Status: AC
  Filled 2020-05-20: qty 1

## 2020-05-20 MED ORDER — LIDOCAINE 2% (20 MG/ML) 5 ML SYRINGE
INTRAMUSCULAR | Status: AC
  Filled 2020-05-20: qty 5

## 2020-05-20 MED ORDER — ONDANSETRON HCL 4 MG/2ML IJ SOLN: 4.0000 mg | Freq: Four times a day (QID) | INTRAMUSCULAR | Status: DC | PRN

## 2020-05-20 MED ORDER — CHLORHEXIDINE GLUCONATE CLOTH 2 % EX PADS
6.0000 | MEDICATED_PAD | Freq: Every day | CUTANEOUS | Status: DC
  Administered 2020-05-21 – 2020-05-24 (×4): 6 via TOPICAL

## 2020-05-20 MED ORDER — AMISULPRIDE (ANTIEMETIC) 5 MG/2ML IV SOLN: 10.0000 mg | Freq: Once | INTRAVENOUS | Status: DC | PRN

## 2020-05-20 MED ORDER — LIDOCAINE 2% (20 MG/ML) 5 ML SYRINGE
INTRAMUSCULAR | Status: DC | PRN
  Administered 2020-05-20: 80 mg via INTRAVENOUS

## 2020-05-20 MED ORDER — SUGAMMADEX SODIUM 200 MG/2ML IV SOLN
INTRAVENOUS | Status: DC | PRN
  Administered 2020-05-20: 200 mg via INTRAVENOUS

## 2020-05-20 MED ORDER — POTASSIUM CHLORIDE 10 MEQ/100ML IV SOLN
10.0000 meq | INTRAVENOUS | Status: AC
  Administered 2020-05-20 (×2): 10 meq via INTRAVENOUS
  Filled 2020-05-20 (×3): qty 100

## 2020-05-20 MED ORDER — ONDANSETRON HCL 4 MG/2ML IJ SOLN
INTRAMUSCULAR | Status: AC
  Filled 2020-05-20: qty 2

## 2020-05-20 MED ORDER — PROPOFOL 10 MG/ML IV BOLUS
INTRAVENOUS | Status: DC | PRN
  Administered 2020-05-20: 120 mg via INTRAVENOUS

## 2020-05-20 MED ORDER — DOCUSATE SODIUM 100 MG PO CAPS
100.0000 mg | ORAL_CAPSULE | Freq: Two times a day (BID) | ORAL | Status: DC
  Administered 2020-05-20 – 2020-05-24 (×8): 100 mg via ORAL
  Filled 2020-05-20 (×8): qty 1

## 2020-05-20 MED ORDER — FENTANYL CITRATE (PF) 100 MCG/2ML IJ SOLN
INTRAMUSCULAR | Status: DC | PRN
  Administered 2020-05-20 (×2): 50 ug via INTRAVENOUS

## 2020-05-20 MED ORDER — METHOCARBAMOL 500 MG PO TABS
500.0000 mg | ORAL_TABLET | Freq: Four times a day (QID) | ORAL | Status: DC | PRN
  Administered 2020-05-21 – 2020-05-24 (×7): 500 mg via ORAL
  Filled 2020-05-20 (×7): qty 1

## 2020-05-20 MED ORDER — HYDROCODONE-ACETAMINOPHEN 7.5-325 MG PO TABS
1.0000 | ORAL_TABLET | ORAL | Status: DC | PRN
Start: 2020-05-20 — End: 2020-05-21

## 2020-05-20 MED ORDER — TRANEXAMIC ACID-NACL 1000-0.7 MG/100ML-% IV SOLN
1000.0000 mg | Freq: Once | INTRAVENOUS | Status: AC
  Administered 2020-05-20: 1000 mg via INTRAVENOUS
  Filled 2020-05-20: qty 100

## 2020-05-20 MED ORDER — LACTATED RINGERS IV SOLN
INTRAVENOUS | Status: DC | PRN
  Administered 2020-05-20: 1000 mL via INTRAVENOUS

## 2020-05-20 MED ORDER — SENNA 8.6 MG PO TABS
1.0000 | ORAL_TABLET | Freq: Two times a day (BID) | ORAL | Status: DC
  Administered 2020-05-20 – 2020-05-23 (×7): 8.6 mg via ORAL
  Filled 2020-05-20 (×8): qty 1

## 2020-05-20 MED ORDER — FENTANYL CITRATE (PF) 100 MCG/2ML IJ SOLN
INTRAMUSCULAR | Status: AC
  Filled 2020-05-20: qty 2

## 2020-05-20 MED ORDER — LACTATED RINGERS IV SOLN: INTRAVENOUS | Status: DC

## 2020-05-20 MED ORDER — ROCURONIUM BROMIDE 10 MG/ML (PF) SYRINGE
PREFILLED_SYRINGE | INTRAVENOUS | Status: AC
  Filled 2020-05-20: qty 10

## 2020-05-20 MED ORDER — CEFAZOLIN SODIUM-DEXTROSE 2-4 GM/100ML-% IV SOLN
2.0000 g | Freq: Four times a day (QID) | INTRAVENOUS | Status: AC
  Administered 2020-05-20 – 2020-05-21 (×2): 2 g via INTRAVENOUS
  Filled 2020-05-20 (×2): qty 100

## 2020-05-20 MED ORDER — DEXAMETHASONE SODIUM PHOSPHATE 4 MG/ML IJ SOLN
INTRAMUSCULAR | Status: DC | PRN
  Administered 2020-05-20: 4 mg via INTRAVENOUS

## 2020-05-20 MED ORDER — ONDANSETRON HCL 4 MG/2ML IJ SOLN: 4.0000 mg | Freq: Once | INTRAMUSCULAR | Status: DC | PRN

## 2020-05-20 SURGICAL SUPPLY — 49 items
ADH SKN CLS APL DERMABOND .7 (GAUZE/BANDAGES/DRESSINGS) ×1
APL PRP STRL LF DISP 70% ISPRP (MISCELLANEOUS) ×1
BAG SPEC THK2 15X12 ZIP CLS (MISCELLANEOUS)
BAG ZIPLOCK 12X15 (MISCELLANEOUS) IMPLANT
BIT DRILL CANN LG 4.3MM (BIT) IMPLANT
CHLORAPREP W/TINT 26 (MISCELLANEOUS) ×2 IMPLANT
COVER PERINEAL POST (MISCELLANEOUS) ×2 IMPLANT
COVER SURGICAL LIGHT HANDLE (MISCELLANEOUS) ×2 IMPLANT
COVER WAND RF STERILE (DRAPES) ×1 IMPLANT
DERMABOND ADVANCED (GAUZE/BANDAGES/DRESSINGS) ×1
DERMABOND ADVANCED .7 DNX12 (GAUZE/BANDAGES/DRESSINGS) ×1 IMPLANT
DRAPE C-ARM 42X120 X-RAY (DRAPES) ×2 IMPLANT
DRAPE C-ARMOR (DRAPES) ×2 IMPLANT
DRAPE SHEET LG 3/4 BI-LAMINATE (DRAPES) ×2 IMPLANT
DRAPE STERI IOBAN 125X83 (DRAPES) ×2 IMPLANT
DRAPE U-SHAPE 47X51 STRL (DRAPES) ×4 IMPLANT
DRILL BIT CANN LG 4.3MM (BIT) ×2
DRSG AQUACEL AG ADV 3.5X 6 (GAUZE/BANDAGES/DRESSINGS) ×2 IMPLANT
DRSG AQUACEL AG ADV 3.5X10 (GAUZE/BANDAGES/DRESSINGS) ×2 IMPLANT
FACESHIELD WRAPAROUND (MASK) ×6 IMPLANT
FACESHIELD WRAPAROUND OR TEAM (MASK) ×3 IMPLANT
GAUZE SPONGE 4X4 12PLY STRL (GAUZE/BANDAGES/DRESSINGS) ×2 IMPLANT
GLOVE BIOGEL M 8.0 STRL (GLOVE) ×3 IMPLANT
GLOVE SRG 8 PF TXTR STRL LF DI (GLOVE) ×1 IMPLANT
GLOVE SURG ENC MOIS LTX SZ8.5 (GLOVE) ×4 IMPLANT
GLOVE SURG UNDER LTX SZ7.5 (GLOVE) ×1 IMPLANT
GLOVE SURG UNDER POLY LF SZ8 (GLOVE) ×2
GLOVE SURG UNDER POLY LF SZ8.5 (GLOVE) ×2 IMPLANT
GOWN SPEC L3 XXLG W/TWL (GOWN DISPOSABLE) ×2 IMPLANT
GOWN STRL REUS W/TWL XL LVL3 (GOWN DISPOSABLE) ×2 IMPLANT
GUIDEPIN 3.2X17.5 THRD DISP (PIN) ×1 IMPLANT
HFN 125 DEG 11MM X 180MM (Orthopedic Implant) ×1 IMPLANT
IRRIGATION SURGIPHOR STRL (IV SOLUTION) IMPLANT
KIT BASIN OR (CUSTOM PROCEDURE TRAY) ×2 IMPLANT
KIT TURNOVER KIT A (KITS) ×2 IMPLANT
MANIFOLD NEPTUNE II (INSTRUMENTS) ×2 IMPLANT
MARKER SKIN DUAL TIP RULER LAB (MISCELLANEOUS) ×2 IMPLANT
PACK TOTAL JOINT (CUSTOM PROCEDURE TRAY) ×2 IMPLANT
PENCIL SMOKE EVACUATOR (MISCELLANEOUS) IMPLANT
SCREW BONE CORTICAL 5.0X36 (Screw) ×1 IMPLANT
SCREW LAG HIP NAIL 10.5X95 (Screw) ×1 IMPLANT
SUT MNCRL AB 3-0 PS2 18 (SUTURE) IMPLANT
SUT MON AB 2-0 CT1 36 (SUTURE) ×2 IMPLANT
SUT VIC AB 1 CT1 27 (SUTURE) ×2
SUT VIC AB 1 CT1 27XBRD ANTBC (SUTURE) ×1 IMPLANT
TOWEL OR 17X26 10 PK STRL BLUE (TOWEL DISPOSABLE) ×2 IMPLANT
TOWEL OR NON WOVEN STRL DISP B (DISPOSABLE) ×2 IMPLANT
TRAY FOLEY MTR SLVR 14FR STAT (SET/KITS/TRAYS/PACK) ×2 IMPLANT
YANKAUER SUCT BULB TIP NO VENT (SUCTIONS) ×2 IMPLANT

## 2020-05-20 NOTE — Telephone Encounter (Signed)
Patient fell and broke his hip in three different places. He is currently at [facility] and is to have surgery today. They are giving him pain medication and Vaughan Basta wanted you to be aware of this. She would like to speak to someone  Please call

## 2020-05-20 NOTE — Progress Notes (Signed)
ANTICOAGULATION CONSULT NOTE -  Pharmacy Consult for heparin Indication: atrial fibrillation; apixaban on hold  Allergies  Allergen Reactions  . Nsaids Other (See Comments)    Cannot have any of these because he is currently taking Eliquis    Patient Measurements: Height: 5\' 10"  (177.8 cm) Weight: 89 kg (196 lb 3.4 oz) IBW/kg (Calculated) : 73 Heparin Dosing Weight = TBW = 89 kg  Vital Signs: Temp: 98.2 F (36.8 C) (03/22 2123) Temp Source: Oral (03/22 2123) BP: 116/62 (03/22 2123) Pulse Rate: 91 (03/22 2123)  Labs: Recent Labs    05/18/20 2049 05/19/20 0624 05/19/20 1817 05/20/20 0211  HGB 15.8 14.9  --  13.4  HCT 48.8 46.0  --  41.4  PLT 169 148*  --  139*  APTT  --   --  44* 42*  HEPARINUNFRC  --   --  1.12*  --   CREATININE 1.90* 1.84*  --  1.83*    Estimated Creatinine Clearance: 35 mL/min (A) (by C-G formula based on SCr of 1.83 mg/dL (H)).   Medical History: Past Medical History:  Diagnosis Date  . Arthritis    "knees; lower back" (08/09/2016)  . Atrial fibrillation ([place])    remote hx/notes 08/07/2016  . Coronary artery disease   . Episodes of trembling    "most of the time it's my left hand" (08/09/2016)  . GI bleeding 08/2017  . High cholesterol   . History of kidney stones   . Hypertension   . Melanoma of shoulder ([place]) 2000s   "left"  . Mitral regurgitation and mitral stenosis    hx/notes 08/07/2016  . Moderate aortic stenosis    hx/notes 08/07/2016  . Parkinson's disease ([place])   . Prostate cancer ([place]) 2011   hx/notes 08/07/2016    Medications: Apixaban 2.5 mg PO BID PTA Last dose on 3/21 @ 0800  Assessment: Pt is an 62 yoM admitted with fall resulting in left hip fracture. Apixaban is currently being held for possible surgical repair. Pharmacy consulted to dose heparin while apixaban being held pre-op. Baseline HL 1.12, aPTT 44    Today, 05/20/20   APTT 42 subtherapeutic on 1300 units/hr  CBC: Hgb WNL; Plt 139  SCr 1.83, CrCl ~35  mL/min   Goal of Therapy:  Heparin level 0.3-0.7 units/ml aPTT 66-102 seconds Monitor platelets by anticoagulation protocol: Yes   Plan:   Increase heparin drip to 1500 units/hr  CBC, HL/APTT daily while on heparin infusion. Monitoring using aPTT until aPTT and HL correlate  Turn heparin drip off at 0500 prior to surgery today  F/u resuming heparin/apixaban  Dolly Rias RPh 05/20/2020, 3:04 AM

## 2020-05-20 NOTE — Telephone Encounter (Signed)
Thanks for the update.  I reviewed his chart.  I'm sorry to hear he fx his hip.  He needs the surgery and while his Parkinsons Disease may get set back a bit with anesthesia, reassure her its okay and pt will continue Parkinsons Disease meds.

## 2020-05-20 NOTE — Interval H&P Note (Signed)
History and Physical Interval Note:  05/20/2020 12:41 PM  [name]  has presented today for surgery, with the diagnosis of Left Femoral Fracture.  The various methods of treatment have been discussed with the patient and family. After consideration of risks, benefits and other options for treatment, the patient has consented to  Procedure(s): INTRAMEDULLARY (IM) NAIL FEMORAL (Left) as a surgical intervention.  The patient's history has been reviewed, patient examined, no change in status, stable for surgery.  I have reviewed the patient's chart and labs.  Questions were answered to the patient's satisfaction.    The risks, benefits, and alternatives were discussed with the patient. There are risks associated with the surgery including, but not limited to, problems with anesthesia (death), infection, differences in leg length/angulation/rotation, fracture of bones, loosening or failure of implants, malunion, nonunion, hematoma (blood accumulation) which may require surgical drainage, blood clots, pulmonary embolism, nerve injury (foot drop), and blood vessel injury. The patient understands these risks and elects to proceed.    [name]

## 2020-05-20 NOTE — Progress Notes (Signed)
ANTICOAGULATION CONSULT NOTE -  Pharmacy Consult for heparin>>apixaban Indication: atrial fibrillation; apixaban on hold  Allergies  Allergen Reactions  . Nsaids Other (See Comments)    Cannot have any of these because he is currently taking Eliquis    Patient Measurements: Height: 5\' 10"  (177.8 cm) Weight: 93.2 kg (205 lb 7.5 oz) IBW/kg (Calculated) : 73 Heparin Dosing Weight = TBW = 89 kg  Vital Signs: Temp: 97.6 F (36.4 C) (03/23 0502) Temp Source: Oral (03/23 0502) BP: 160/98 (03/23 1210) Pulse Rate: 88 (03/23 1210)  Labs: Recent Labs    05/18/20 2049 05/19/20 0624 05/19/20 1817 05/20/20 0211  HGB 15.8 14.9  --  13.4  HCT 48.8 46.0  --  41.4  PLT 169 148*  --  139*  APTT  --   --  44* 42*  HEPARINUNFRC  --   --  1.12*  --   CREATININE 1.90* 1.84*  --  1.83*    Estimated Creatinine Clearance: 35.7 mL/min (A) (by C-G formula based on SCr of 1.83 mg/dL (H)).   Medications: Apixaban 2.5 mg PO BID PTA Last dose on 3/21 @ 0800  Assessment: Pt is an 13 yoM admitted with fall resulting in left hip fracture. Apixaban is currently being held for possible surgical repair. Pharmacy consulted to dose heparin while apixaban being held pre-op. Baseline HL 1.12, aPTT 44  Today, 05/20/20   APTT 42 subtherapeutic on 1300 units/hr  CBC: Hgb WNL; Plt 139  SCr 1.83, CrCl ~35 mL/min  S/p IM nail for L femur fx> pharmacy to resume PTA DOAC 3/23 AM if Hg > = 7   Plan:  Pharmacy to resume PTA DOAC ( apixaban 2.5 mg po bid) tomorrow am if Hg > =7  Eudelia Bunch, Pharm.D 05/20/2020 2:23 PM

## 2020-05-20 NOTE — Transfer of Care (Signed)
Immediate Anesthesia Transfer of Care Note  Patient: [name]  Procedure(s) Performed: INTRAMEDULLARY (IM) NAIL FEMORAL (Left Hip)  Patient Location: PACU  Anesthesia Type:General  Level of Consciousness: awake, alert  and oriented  Airway & Oxygen Therapy: Patient Spontanous Breathing and Patient connected to face mask  Post-op Assessment: Report given to RN and Post -op Vital signs reviewed and stable  Post vital signs: Reviewed and stable  Last Vitals:  Vitals Value Taken Time  BP 141/96 05/20/20 1430  Temp    Pulse 73 05/20/20 1432  Resp 16 05/20/20 1432  SpO2 89 % 05/20/20 1432  Vitals shown include unvalidated device data.  Last Pain:  Vitals:   05/20/20 1210  TempSrc:   PainSc: 0-No pain      Patients Stated Pain Goal: 1 (79/39/03 0092)  Complications: No complications documented.

## 2020-05-20 NOTE — Anesthesia Preprocedure Evaluation (Addendum)
Anesthesia Evaluation  Patient identified by MRN, date of birth, ID band Patient awake    Reviewed: Allergy & Precautions, NPO status , Patient's Chart, lab work & pertinent test results  History of Anesthesia Complications Negative for: history of anesthetic complications  Airway Mallampati: II  TM Distance: >3 FB Neck ROM: Full    Dental no notable dental hx.    Pulmonary neg pulmonary ROS,    Pulmonary exam normal        Cardiovascular hypertension, + CAD and + Cardiac Stents (2018)  Normal cardiovascular exam+ dysrhythmias Atrial Fibrillation + Valvular Problems/Murmurs AS and MR      Neuro/Psych Parkinson's disease    GI/Hepatic negative GI ROS, Neg liver ROS,   Endo/Other  negative endocrine ROS  Renal/GU Renal Insufficiency and CRFRenal disease (Cr 1.83)  negative genitourinary   Musculoskeletal  (+) Arthritis ,   Abdominal   Peds  Hematology Eliquis 05/18/20   Anesthesia Other Findings  Echo 2018: EF 50-55%, mild AS, mild MR  RHC 2020:  WHO Grp II pulmonary hypertension Mean PA 42 mmHg, PVR 2.4 WU Normal RV systolic function (PAPi 2.4)  Reproductive/Obstetrics                            Anesthesia Physical Anesthesia Plan  ASA: III  Anesthesia Plan: General   Post-op Pain Management:    Induction: Intravenous  PONV Risk Score and Plan: Treatment may vary due to age or medical condition, Ondansetron and Dexamethasone  Airway Management Planned: Oral ETT  Additional Equipment: None  Intra-op Plan:   Post-operative Plan: Extubation in OR  Informed Consent: I have reviewed the patients History and Physical, chart, labs and discussed the procedure including the risks, benefits and alternatives for the proposed anesthesia with the patient or authorized representative who has indicated his/her understanding and acceptance.     Dental advisory given  Plan Discussed  with: CRNA  Anesthesia Plan Comments:        Anesthesia Quick Evaluation

## 2020-05-20 NOTE — Progress Notes (Signed)
PROGRESS NOTE    Keylon Labelle  FHL:[id number] DOB: Jan 14, 1936 DOA: 05/18/2020 PCP: Carol Ada, MD    Brief Narrative: 85 year old male with history of A. fib on Eliquis now on hold for hip surgery, stage IIIb CKD, dilated cardiomyopathy, essential hypertension, Parkinson's disease, prostate cancer admitted status post ground-level fall and left hip fracture.  Assessment & Plan:   Active Problems:   CKD (chronic kidney disease) stage 3, GFR 30-59 ml/min (HCC)   Atrial fibrillation (HCC)   Pulmonary hypertension (HCC)   Essential hypertension   Closed left hip fracture (HCC)   Intertrochanteric fracture (HCC)   Hypokalemia   Dilated cardiomyopathy (HCC)   CKD (chronic kidney disease), stage III (HCC)   Parkinson's disease ([place]) #1 left hip fracture status post mechanical fall no loss of consciousness.  Plan to have surgery 05/20/2020.  #2 permanent atrial fibrillation on heparin bridge.  Continue Cardizem.  #3 history of dilated cardiomyopathy with pulmonary hypertension on normal saline 75 cc an hour monitor. Last echocardiogram in November 2018 with ejection fraction 50 to 55%, mild aortic valve stenosis, mild mitral valve regurgitation.  #4 Parkinson's disease on Sinemet  #5 hypokalemia potassium 2.9 on admission now up to 3.1 will replete.  Check mag level 2.5   Nutrition Problem: Increased nutrient needs Etiology: hip fracture,post-op healing  Signs/Symptoms: estimated needs    Interventions: Ensure Surgery,MVI  Estimated body mass index is 29.48 kg/m as calculated from the following:   Height as of this encounter: 5\' 10"  (1.778 m).   Weight as of this encounter: 93.2 kg.  DVT prophylaxis: Heparin  code Status: Full code Family Communication: None at bedside  disposition Plan:  Status is: Inpatient  Dispo: The patient is from: Home              Anticipated d/c is to: SNF              Patient currently is not medically stable to d/c.   Difficult  to place patient na   Consultants:   Ortho  Procedures: None Antimicrobials: None Subjective: Patient is resting in bed reports the pain is 8 out of 10 anxious to have surgery done denies any chest pain shortness of breath  Objective: Vitals:   05/19/20 1433 05/19/20 2123 05/20/20 0502 05/20/20 1028  BP: 127/64 116/62 130/75 (!) 196/82  Pulse: 76 91 78   Resp: 16 16 16    Temp: 98.1 F (36.7 C) 98.2 F (36.8 C) 97.6 F (36.4 C)   TempSrc: Oral Oral Oral   SpO2: 95% 94% 92%   Weight:   93.2 kg   Height:        Intake/Output Summary (Last 24 hours) at 05/20/2020 1115 Last data filed at 05/20/2020 1100 Gross per 24 hour  Intake 859.02 ml  Output 1575 ml  Net -715.98 ml   Filed Weights   05/18/20 2030 05/20/20 0502  Weight: 89 kg 93.2 kg    Examination:  General exam: Appears calm and comfortable  Respiratory system: Clear to auscultation. Respiratory effort normal. Cardiovascular system: S1 & S2 heard, RRR. No JVD, murmurs, rubs, gallops or clicks. No pedal edema. Gastrointestinal system: Abdomen is nondistended, soft and nontender. No organomegaly or masses felt. Normal bowel sounds heard. Central nervous system: Alert and oriented. No focal neurological deficits. Extremities: Left hip tenderness. Skin: No rashes, lesions or ulcers Psychiatry: Judgement and insight appear normal. Mood & affect appropriate.     Data Reviewed: I have personally reviewed following labs and imaging  studies  CBC: Recent Labs  Lab 05/18/20 2049 05/19/20 0624 05/20/20 0211  WBC 5.7 8.1 6.6  NEUTROABS 4.1  --  5.2  HGB 15.8 14.9 13.4  HCT 48.8 46.0 41.4  MCV 90.5 91.6 92.4  PLT 169 148* 517*   Basic Metabolic Panel: Recent Labs  Lab 05/18/20 2049 05/19/20 0624 05/19/20 1817 05/20/20 0211  NA 138 136  --  137  K 2.9* 3.1* 3.4* 3.1*  CL 86* 89*  --  93*  CO2 37* 32  --  31  GLUCOSE 105* 127*  --  116*  BUN 39* 40*  --  36*  CREATININE 1.90* 1.84*  --  1.83*  CALCIUM  9.9 9.0  --  8.8*  MG  --  2.6* 2.6* 2.5*  PHOS  --  5.0*  --  3.6   GFR: Estimated Creatinine Clearance: 35.7 mL/min (A) (by C-G formula based on SCr of 1.83 mg/dL (H)). Liver Function Tests: Recent Labs  Lab 05/19/20 0624 05/20/20 0211  AST 36 24  ALT 9 <5  ALKPHOS 128* 116  BILITOT 3.0* 4.4*  PROT 7.4 6.8  ALBUMIN 3.9 3.6   No results for input(s): LIPASE, AMYLASE in the last 168 hours. No results for input(s): AMMONIA in the last 168 hours. Coagulation Profile: No results for input(s): INR, PROTIME in the last 168 hours. Cardiac Enzymes: No results for input(s): CKTOTAL, CKMB, CKMBINDEX, TROPONINI in the last 168 hours. BNP (last 3 results) Recent Labs    10/10/19 1037 11/01/19 1322  PROBNP 1,547* 2,160*   HbA1C: No results for input(s): HGBA1C in the last 72 hours. CBG: No results for input(s): GLUCAP in the last 168 hours. Lipid Profile: No results for input(s): CHOL, HDL, LDLCALC, TRIG, CHOLHDL, LDLDIRECT in the last 72 hours. Thyroid Function Tests: No results for input(s): TSH, T4TOTAL, FREET4, T3FREE, THYROIDAB in the last 72 hours. Anemia Panel: No results for input(s): VITAMINB12, FOLATE, FERRITIN, TIBC, IRON, RETICCTPCT in the last 72 hours. Sepsis Labs: No results for input(s): PROCALCITON, LATICACIDVEN in the last 168 hours.  Recent Results (from the past 240 hour(s))  Resp Panel by RT-PCR (Flu A&B, Covid) Nasopharyngeal Swab     Status: None   Collection Time: 05/18/20 10:49 PM   Specimen: Nasopharyngeal Swab; Nasopharyngeal(NP) swabs in vial transport medium  Result Value Ref Range Status   SARS Coronavirus 2 by RT PCR NEGATIVE NEGATIVE Final    Comment: (NOTE) SARS-CoV-2 target nucleic acids are NOT DETECTED.  The SARS-CoV-2 RNA is generally detectable in upper respiratory specimens during the acute phase of infection. The lowest concentration of SARS-CoV-2 viral copies this assay can detect is 138 copies/mL. A negative result does not  preclude SARS-Cov-2 infection and should not be used as the sole basis for treatment or other patient management decisions. A negative result may occur with  improper specimen collection/handling, submission of specimen other than nasopharyngeal swab, presence of viral mutation(s) within the areas targeted by this assay, and inadequate number of viral copies(<138 copies/mL). A negative result must be combined with clinical observations, patient history, and epidemiological information. The expected result is Negative.  Fact Sheet for Patients:  EntrepreneurPulse.com.au  Fact Sheet for Healthcare Providers:  IncredibleEmployment.be  This test is no t yet approved or cleared by the [place] FDA and  has been authorized for detection and/or diagnosis of SARS-CoV-2 by FDA under an Emergency Use Authorization (EUA). This EUA will remain  in effect (meaning this test can be used) for the duration of  the COVID-19 declaration under Section 564(b)(1) of the Act, 21 U.S.C.section 360bbb-3(b)(1), unless the authorization is terminated  or revoked sooner.       Influenza A by PCR NEGATIVE NEGATIVE Final   Influenza B by PCR NEGATIVE NEGATIVE Final    Comment: (NOTE) The Xpert Xpress SARS-CoV-2/FLU/RSV plus assay is intended as an aid in the diagnosis of influenza from Nasopharyngeal swab specimens and should not be used as a sole basis for treatment. Nasal washings and aspirates are unacceptable for Xpert Xpress SARS-CoV-2/FLU/RSV testing.  Fact Sheet for Patients: EntrepreneurPulse.com.au  Fact Sheet for Healthcare Providers: IncredibleEmployment.be  This test is not yet approved or cleared by the [place] FDA and has been authorized for detection and/or diagnosis of SARS-CoV-2 by FDA under an Emergency Use Authorization (EUA). This EUA will remain in effect (meaning this test can be used) for the  duration of the COVID-19 declaration under Section 564(b)(1) of the Act, 21 U.S.C. section 360bbb-3(b)(1), unless the authorization is terminated or revoked.  Performed at [facility]   Surgical PCR screen     Status: None   Collection Time: 05/20/20  3:14 AM   Specimen: Nasal Mucosa; Nasal Swab  Result Value Ref Range Status   MRSA, PCR NEGATIVE NEGATIVE Final   Staphylococcus aureus NEGATIVE NEGATIVE Final    Comment: (NOTE) The Xpert SA Assay (FDA approved for NASAL specimens in patients 79 years of age and older), is one component of a comprehensive surveillance program. It is not intended to diagnose infection nor to guide or monitor treatment. Performed at [facility], [place] [address] [place], [place] [address]          Radiology Studies: DG CHEST PORT 1 VIEW  Result Date: 05/19/2020 CLINICAL DATA:  Dilated cardiomyopathy EXAM: PORTABLE CHEST 1 VIEW COMPARISON:  February 20 ,2020 FINDINGS: The heart size and mediastinal contours are moderately enlarged. Aortic knob calcifications are seen. There is elevation of the right hemidiaphragm. Prominence of the central pulmonary vasculature seen. No pleural effusion is noted. No acute osseous abnormality. IMPRESSION: Cardiomegaly and pulmonary vascular congestion. Electronically Signed   By: Prudencio Pair M.D.   On: 05/19/2020 19:04   DG Hip Unilat With Pelvis 2-3 Views Left  Result Date: 05/18/2020 CLINICAL DATA:  LEFT side pain post fall EXAM: DG HIP (WITH OR WITHOUT PELVIS) 2-3V LEFT COMPARISON:  None FINDINGS: Osseous demineralization. Hip and SI joint spaces preserved. Mildly displaced intertrochanteric fracture LEFT femur. No dislocation. Pelvis intact. Brachytherapy seed implants at prostate gland. IMPRESSION: Mildly displaced intertrochanteric fracture LEFT femur. Electronically Signed   By: Lavonia Dana M.D.   On: 05/18/2020 21:54   DG FEMUR MIN 2 VIEWS LEFT  Result Date:  05/18/2020 CLINICAL DATA:  LEFT hip pain EXAM: LEFT FEMUR 2 VIEWS COMPARISON:  LEFT hip radiographs 05/18/2020 FINDINGS: Osseous demineralization. Joint space narrowing LEFT knee. Minimally displaced intertrochanteric fracture LEFT femur. No additional fracture or dislocation identified. Brachytherapy seed implants at prostate bed. Mild soft tissue swelling laterally at the LEFT hip region. IMPRESSION: Minimally displaced intertrochanteric fracture LEFT femur. Electronically Signed   By: Lavonia Dana M.D.   On: 05/18/2020 21:55        Scheduled Meds: . carbidopa-levodopa  2 tablet Oral TID  . cholecalciferol  1,000 Units Oral Daily  . diltiazem  120 mg Oral Daily  . feeding supplement  237 mL Oral BID BM  . gabapentin  300 mg Oral TID  . multivitamin with minerals  1 tablet Oral  Daily  . mupirocin ointment  1 application Nasal BID  . oxyCODONE  10 mg Oral Q12H  . povidone-iodine  2 application Topical Once  . rosuvastatin  10 mg Oral Q2000  . senna-docusate  1 tablet Oral BID   Continuous Infusions: . sodium chloride 75 mL/hr at 05/20/20 1023  .  ceFAZolin (ANCEF) IV    . potassium chloride 10 mEq (05/20/20 1028)  . tranexamic acid       LOS: 1 day   Georgette Shell, MD  05/20/2020, 11:15 AM

## 2020-05-20 NOTE — Discharge Instructions (Signed)
 Dr. Lataya Varnell Adult Hip & Knee Specialist [facility] [address] [place], [place] [address] [phone]   POSTOPERATIVE DIRECTIONS    Hip Rehabilitation, Guidelines Following Surgery   WEIGHT BEARING Weight bearing as tolerated with assist device (walker, cane, etc) as directed, use it as long as suggested by your surgeon or therapist, typically at least 4-6 weeks.   HOME CARE INSTRUCTIONS  Remove items at home which could result in a fall. This includes throw rugs or furniture in walking pathways.  Continue medications as instructed at time of discharge.  You may have some home medications which will be placed on hold until you complete the course of blood thinner medication.  4 days after discharge, you may start showering. No tub baths or soaking your incisions. Do not put on socks or shoes without following the instructions of your caregivers.   Sit on chairs with arms. Use the chair arms to help push yourself up when arising.  Arrange for the use of a toilet seat elevator so you are not sitting low.   Walk with walker as instructed.  You may resume a sexual relationship in one month or when given the OK by your caregiver.  Use walker as long as suggested by your caregivers.  Avoid periods of inactivity such as sitting longer than an hour when not asleep. This helps prevent blood clots.  You may return to work once you are cleared by your surgeon.  Do not drive a car for 6 weeks or until released by your surgeon.  Do not drive while taking narcotics.  Wear elastic stockings for two weeks following surgery during the day but you may remove then at night.  Make sure you keep all of your appointments after your operation with all of your doctors and caregivers. You should call the office at the above phone number and make an appointment for approximately two weeks after the date of your surgery. Please pick up a stool softener and laxative  for home use as long as you are requiring pain medications.  ICE to the affected hip every three hours for 30 minutes at a time and then as needed for pain and swelling. Continue to use ice on the hip for pain and swelling from surgery. You may notice swelling that will progress down to the foot and ankle.  This is normal after surgery.  Elevate the leg when you are not up walking on it.   It is important for you to complete the blood thinner medication as prescribed by your doctor.  Continue to use the breathing machine which will help keep your temperature down.  It is common for your temperature to cycle up and down following surgery, especially at night when you are not up moving around and exerting yourself.  The breathing machine keeps your lungs expanded and your temperature down.  RANGE OF MOTION AND STRENGTHENING EXERCISES  These exercises are designed to help you keep full movement of your hip joint. Follow your caregiver's or physical therapist's instructions. Perform all exercises about fifteen times, three times per day or as directed. Exercise both hips, even if you have had only one joint replacement. These exercises can be done on a training (exercise) mat, on the floor, on a table or on a bed. Use whatever works the best and is most comfortable for you. Use music or television while you are exercising so that the exercises are a pleasant break in your day. This   will make your life better with the exercises acting as a break in routine you can look forward to.  Lying on your back, slowly slide your foot toward your buttocks, raising your knee up off the floor. Then slowly slide your foot back down until your leg is straight again.  Lying on your back spread your legs as far apart as you can without causing discomfort.  Lying on your side, raise your upper leg and foot straight up from the floor as far as is comfortable. Slowly lower the leg and repeat.  Lying on your back, tighten up the  muscle in the front of your thigh (quadriceps muscles). You can do this by keeping your leg straight and trying to raise your heel off the floor. This helps strengthen the largest muscle supporting your knee.  Lying on your back, tighten up the muscles of your buttocks both with the legs straight and with the knee bent at a comfortable angle while keeping your heel on the floor.   SKILLED REHAB INSTRUCTIONS: If the patient is transferred to a skilled rehab facility following release from the hospital, a list of the current medications will be sent to the facility for the patient to continue.  When discharged from the skilled rehab facility, please have the facility set up the patient's Home Health Physical Therapy prior to being released. Also, the skilled facility will be responsible for providing the patient with their medications at time of release from the facility to include their pain medication and their blood thinner medication. If the patient is still at the rehab facility at time of the two week follow up appointment, the skilled rehab facility will also need to assist the patient in arranging follow up appointment in our office and any transportation needs.  MAKE SURE YOU:  Understand these instructions.  Will watch your condition.  Will get help right away if you are not doing well or get worse.  Pick up stool softner and laxative for home use following surgery while on pain medications. Daily dry dressing changes as needed. In 4 days, you may remove your dressings and begin taking showers - no tub baths or soaking the incisions. Continue to use ice for pain and swelling after surgery. Do not use any lotions or creams on the incision until instructed by your surgeon.   

## 2020-05-20 NOTE — Anesthesia Procedure Notes (Signed)
Procedure Name: Intubation Date/Time: 05/20/2020 12:59 PM Performed by: Claudia Desanctis, CRNA Pre-anesthesia Checklist: Patient identified, Emergency Drugs available, Suction available and Patient being monitored Patient Re-evaluated:Patient Re-evaluated prior to induction Oxygen Delivery Method: Circle system utilized Preoxygenation: Pre-oxygenation with 100% oxygen Induction Type: IV induction Ventilation: Mask ventilation without difficulty Laryngoscope Size: 2 and Miller Grade View: Grade I Tube type: Oral Tube size: 7.5 mm Number of attempts: 1 Airway Equipment and Method: Stylet Placement Confirmation: ETT inserted through vocal cords under direct vision,  positive ETCO2 and breath sounds checked- equal and bilateral Secured at: 22 cm Tube secured with: Tape Dental Injury: Teeth and Oropharynx as per pre-operative assessment

## 2020-05-20 NOTE — Op Note (Signed)
OPERATIVE REPORT  SURGEON: Rod Can, MD   ASSISTANT: Cherlynn June, PA-C.  PREOPERATIVE DIAGNOSIS: Left intertrochanteric femur fracture.   POSTOPERATIVE DIAGNOSIS: Left intertrochanteric femur fracture.   PROCEDURE: Intramedullary fixation, Left femur.   IMPLANTS: Biomet Affixus Hip Fracture Nail, 11 by 180 mm, 125 degrees. 10.5 x 95 mm Hip Fracture Nail Lag Screw. 5 x 36 mm distal interlocking screw 1.  ANESTHESIA:  General  ESTIMATED BLOOD LOSS:-100 mL    ANTIBIOTICS:  2 g Ancef.  DRAINS: None.  COMPLICATIONS: None.   CONDITION: PACU - hemodynamically stable.   BRIEF CLINICAL NOTE: [name] is a 85 y.o. male who presented with an intertrochanteric femur fracture. The patient was admitted to the hospitalist service and underwent perioperative risk stratification and medical optimization.  Upon admission, his Eliquis was stopped, and a heparin drip was started.  The heparin drip was discontinued 6 hours before beginning the procedure.  The risks, benefits, and alternatives to the procedure were explained, and the patient elected to proceed.  PROCEDURE IN DETAIL: Surgical site was marked by myself. The patient was taken to the operating room and anesthesia was induced on the bed. The patient was then transferred to the [facility] table and the nonoperative lower extremity was scissored underneath the operative side. The fracture was reduced with traction, internal rotation, and adduction. The hip was prepped and draped in the normal sterile surgical fashion. Timeout was called verifying side and site of surgery. Preop antibiotics were given with 60 minutes of beginning the procedure.  Fluoroscopy was used to define the patient's anatomy. A 4 cm incision was made just proximal to the tip of the greater trochanter. The awl was used to obtain the standard starting point for a trochanteric entry nail under fluoroscopic control. The guidepin was placed. The entry reamer  was used to open the proximal femur.  On the back table, the nail was assembled onto the jig. The nail was placed into the femur without any difficulty. Through a separate stab incision, the cannula was placed down to the bone in preparation for the cephalomedullary device. A guidepin was placed into the femoral head using AP and lateral fluoroscopy views. The pin was measured, and then reaming was performed to the appropriate depth. The lag screw was inserted to the appropriate depth. The fracture was compressed through the jig. The setscrew was tightened and then loosened one quarter turn. A separate stab incision was created, and the distal interlocking screw was placed using standard AO technique. The jig was removed. Final AP and lateral fluoroscopy views were obtained to confirm fracture reduction and hardware placement. Tip apex distance was appropriate. There was no chondral penetration.  The wounds were copiously irrigated with saline. The wound was closed in layers with #1 Vicryl for the fascia, 2-0 Monocryl for the deep dermal layer, and 3-0 Monocryl subcuticular stitch. Glue was applied to the skin. Once the glue was fully hardened, sterile dressing was applied. The patient was then awakened from anesthesia and taken to the PACU in stable condition. Sponge needle and instrument counts were correct at the end of the case 2. There were no known complications.  We will readmit the patient to the hospitalist. Weightbearing status will be weightbearing as tolerated with a walker. We will resume Eliquis tomorrow morning for DVT prophylaxis. The patient will work with physical therapy and undergo disposition planning.  Please note that a surgical assistant was a medical necessity for this procedure to perform it in a safe and  expeditious manner. Assistant was necessary to provide appropriate retraction of vital neurovascular structures, to prevent femoral fracture, and to allow for anatomic placement  of the prosthesis.  POSTOPERATIVE PLAN: Readmit the patient to the hospitalist.  Weightbearing as tolerated left lower extremity with a walker.  Beginning tomorrow morning, resume Eliquis for DVT prophylaxis.  Mobilize out of bed with physical therapy.  Patient will undergo disposition planning.  Return to the office in 2 weeks for radiographs and suture removal.

## 2020-05-20 NOTE — Telephone Encounter (Signed)
Spoke to pt partner linda, Per pt partner she wanted to make sure dr.Tat knew that the pt is in the hospital and they will give him Anesthesia  and pain medication.

## 2020-05-21 ENCOUNTER — Encounter (HOSPITAL_COMMUNITY): Payer: Self-pay | Admitting: Orthopedic Surgery

## 2020-05-21 LAB — CBC WITH DIFFERENTIAL/PLATELET
Abs Immature Granulocytes: 0.03 10*3/uL (ref 0.00–0.07)
Basophils Absolute: 0 10*3/uL (ref 0.0–0.1)
Basophils Relative: 0 %
Eosinophils Absolute: 0 10*3/uL (ref 0.0–0.5)
Eosinophils Relative: 0 %
HCT: 39.8 % (ref 39.0–52.0)
Hemoglobin: 12.3 g/dL — ABNORMAL LOW (ref 13.0–17.0)
Immature Granulocytes: 0 %
Lymphocytes Relative: 6 %
Lymphs Abs: 0.5 10*3/uL — ABNORMAL LOW (ref 0.7–4.0)
MCH: 29.4 pg (ref 26.0–34.0)
MCHC: 30.9 g/dL (ref 30.0–36.0)
MCV: 95.2 fL (ref 80.0–100.0)
Monocytes Absolute: 0.8 10*3/uL (ref 0.1–1.0)
Monocytes Relative: 10 %
Neutro Abs: 6.8 10*3/uL (ref 1.7–7.7)
Neutrophils Relative %: 84 %
Platelets: 132 10*3/uL — ABNORMAL LOW (ref 150–400)
RBC: 4.18 MIL/uL — ABNORMAL LOW (ref 4.22–5.81)
RDW: 14.5 % (ref 11.5–15.5)
WBC: 8.1 10*3/uL (ref 4.0–10.5)
nRBC: 0 % (ref 0.0–0.2)

## 2020-05-21 LAB — COMPREHENSIVE METABOLIC PANEL
ALT: <5 U/L (ref 0–44)
AST: 22 U/L (ref 15–41)
Albumin: 3.3 g/dL — ABNORMAL LOW (ref 3.5–5.0)
Alkaline Phosphatase: 99 U/L (ref 38–126)
Anion gap: 13 (ref 5–15)
BUN: 31 mg/dL — ABNORMAL HIGH (ref 8–23)
CO2: 29 mmol/L (ref 22–32)
Calcium: 8.8 mg/dL — ABNORMAL LOW (ref 8.9–10.3)
Chloride: 97 mmol/L — ABNORMAL LOW (ref 98–111)
Creatinine, Ser: 1.6 mg/dL — ABNORMAL HIGH (ref 0.61–1.24)
GFR, Estimated: 43 mL/min — ABNORMAL LOW (ref >60)
Glucose, Bld: 126 mg/dL — ABNORMAL HIGH (ref 70–99)
Potassium: 3.7 mmol/L (ref 3.5–5.1)
Sodium: 139 mmol/L (ref 135–145)
Total Bilirubin: 3 mg/dL — ABNORMAL HIGH (ref 0.3–1.2)
Total Protein: 6.6 g/dL (ref 6.5–8.1)

## 2020-05-21 MED ORDER — APIXABAN 2.5 MG PO TABS
2.5000 mg | ORAL_TABLET | Freq: Two times a day (BID) | ORAL | Status: DC
  Administered 2020-05-21 – 2020-05-24 (×7): 2.5 mg via ORAL
  Filled 2020-05-21 (×7): qty 1

## 2020-05-21 MED ORDER — ACETAMINOPHEN 325 MG PO TABS
650.0000 mg | ORAL_TABLET | Freq: Three times a day (TID) | ORAL | Status: DC
  Administered 2020-05-22 – 2020-05-24 (×7): 650 mg via ORAL
  Filled 2020-05-21 (×9): qty 2

## 2020-05-21 MED ORDER — ACETAMINOPHEN 325 MG PO TABS: 325.0000 mg | ORAL_TABLET | Freq: Four times a day (QID) | ORAL | 0 refills | Status: DC | PRN

## 2020-05-21 NOTE — Progress Notes (Signed)
Pharmacy: Eliquis  Patient is an 85 y.o M with hx of afib on Eliquis 2.5mg  bid PTA presented to the ED on 3/21 s/p fall with femur fx.  He underwent intramedullary fixation of left femur on 3/23.  Today, 05/21/2020: - hgb 12.5 - per pt, no bleeding noted - scr 1.60  Plan: - will resume home Eliquis dose of 2.5mg  bid today - pharmacy will sign off. Re-consult [place] if further assistance is needed.  Dia Sitter, PharmD, BCPS 05/21/2020 8:42 AM

## 2020-05-21 NOTE — Evaluation (Signed)
Physical Therapy Evaluation Patient Details Name: [name] MRN: [id number] DOB: [birth year] Today's Date: 05/21/2020   History of Present Illness  85 y.o. male, with PMH of CKD 3, A. fib on Eliquis, Parkinson's, hypertension, CAD, dyslipidemia who presented to the ER on 05/18/2020 with hip pain after ground-level fall. Dx of L hip fx. s/p IM nail 05/20/20.  Clinical Impression  Pt admitted with above diagnosis. +2 assist for supine to sit. Mod assist sit to stand. Pt ambulated 4' with RW, distance limited by L hip pain and fatigue. Pt has 24* assist available at home, but his caregiver has a "bad back". It is likely he will need ST-SNF.  Pt currently with functional limitations due to the deficits listed below (see PT Problem List). Pt will benefit from skilled PT to increase their independence and safety with mobility to allow discharge to the venue listed below.       Follow Up Recommendations SNF; assist for mobility    Equipment Recommendations  Rolling walker with 5" wheels;3in1 (PT)    Recommendations for Other Services       Precautions / Restrictions Precautions Precautions: Fall Precaution Comments: 2 falls in past 4 months Restrictions Weight Bearing Restrictions: No Other Position/Activity Restrictions: WBAT LLE      Mobility  Bed Mobility Overal bed mobility: Needs Assistance Bed Mobility: Supine to Sit     Supine to sit: +2 for physical assistance;Total assist;HOB elevated     General bed mobility comments: assist to raise trunk, pivot hips, and support LLE    Transfers Overall transfer level: Needs assistance Equipment used: Rolling walker (2 wheeled) Transfers: Sit to/from Stand Sit to Stand: Mod assist;From elevated surface         General transfer comment: assist to rise, VCs hand placement  Ambulation/Gait Ambulation/Gait assistance: Min assist;+2 safety/equipment Gait Distance (Feet): 4 Feet Assistive device: Rolling walker (2  wheeled) Gait Pattern/deviations: Step-to pattern;Decreased step length - right;Decreased step length - left Gait velocity: decr   General Gait Details: VCs sequencing, distance limited by pain and fatigue, min A to manage RW  Stairs            Wheelchair Mobility    Modified Rankin (Stroke Patients Only)       Balance Overall balance assessment: Needs assistance Sitting-balance support: Feet supported Sitting balance-Leahy Scale: Fair     Standing balance support: Bilateral upper extremity supported Standing balance-Leahy Scale: Poor                               Pertinent Vitals/Pain Pain Assessment: 0-10 Pain Score: 7  Pain Location: L hip with activity Pain Descriptors / Indicators: Sore;Grimacing;Guarding Pain Intervention(s): Limited activity within patient's tolerance;Monitored during session;Premedicated before session;Ice applied    Home Living Family/patient expects to be discharged to:: Private residence Living Arrangements: Spouse/significant other Available Help at Discharge: Family;Available 24 hours/day;Neighbor Type of Home: House Home Access: Stairs to enter   [employer] of Steps: 1 small step from garage to kitchen Home Layout: One level Home Equipment: Kasandra Knudsen - single point      Prior Function Level of Independence: Independent with assistive device(s)         Comments: ambulates with cane     Hand Dominance        Extremity/Trunk Assessment   Upper Extremity Assessment Upper Extremity Assessment: Defer to OT evaluation    Lower Extremity Assessment Lower Extremity Assessment: LLE deficits/detail LLE  Deficits / Details: knee ext +2/5, hip AAROM flexion ~30* limited by pain, ankle DF AROM -5* LLE: Unable to fully assess due to pain LLE Sensation: history of peripheral neuropathy       Communication   Communication: No difficulties  Cognition Arousal/Alertness: Awake/alert Behavior During Therapy:  WFL for tasks assessed/performed Overall Cognitive Status: Within Functional Limits for tasks assessed                                        General Comments      Exercises Total Joint Exercises Ankle Circles/Pumps: AROM;Both;10 reps;Supine Quad Sets: AROM;Both;5 reps;Supine Heel Slides: AAROM;Left;10 reps;Supine Hip ABduction/ADduction: AAROM;Left;10 reps;Supine   Assessment/Plan    PT Assessment Patient needs continued PT services  PT Problem List Decreased strength;Decreased range of motion;Decreased activity tolerance;Decreased balance;Decreased mobility;Pain;Decreased knowledge of use of DME       PT Treatment Interventions Gait training;Therapeutic activities;Functional mobility training;Therapeutic exercise;Patient/family education    PT Goals (Current goals can be found in the Care Plan section)  Acute Rehab PT Goals Patient Stated Goal: likes to be outside PT Goal Formulation: With patient Time For Goal Achievement: 06/04/20    Frequency Min 3X/week   Barriers to discharge        Co-evaluation PT/OT/SLP Co-Evaluation/Treatment: Yes Reason for Co-Treatment: Complexity of the patient's impairments (multi-system involvement);For patient/therapist safety;To address functional/ADL transfers PT goals addressed during session: Mobility/safety with mobility;Proper use of DME;Balance;Strengthening/ROM         AM-PAC PT "6 Clicks" Mobility  Outcome Measure Help needed turning from your back to your side while in a flat bed without using bedrails?: A Lot Help needed moving from lying on your back to sitting on the side of a flat bed without using bedrails?: Total Help needed moving to and from a bed to a chair (including a wheelchair)?: A Lot Help needed standing up from a chair using your arms (e.g., wheelchair or bedside chair)?: A Lot Help needed to walk in hospital room?: A Little Help needed climbing 3-5 steps with a railing? : Total 6 Click  Score: 11    End of Session Equipment Utilized During Treatment: Gait belt Activity Tolerance: Patient limited by pain;Patient limited by fatigue Patient left: in chair;with call bell/phone within reach;with chair alarm set Nurse Communication: Mobility status;Patient requests pain meds PT Visit Diagnosis: Difficulty in walking, not elsewhere classified (R26.2);Pain;Muscle weakness (generalized) (M62.81);History of falling (Z91.81) Pain - Right/Left: Left Pain - part of body: Hip    Time: 9833-8250 PT Time Calculation (min) (ACUTE ONLY): 27 min   Charges:   PT Evaluation $PT Eval Moderate Complexity: 1 Mod          Philomena Doheny PT 05/21/2020  Acute Rehabilitation Services Pager [phone] Office [phone]

## 2020-05-21 NOTE — Progress Notes (Signed)
PROGRESS NOTE    [name]  VZD:[id number] DOB: [birth year] DOA: 05/18/2020 PCP: Carol Ada, MD    Brief Narrative: 85 year old male with history of A. fib on Eliquis now on hold for hip surgery, stage IIIb CKD, dilated cardiomyopathy, essential hypertension, Parkinson's disease, prostate cancer admitted status post ground-level fall and left hip fracture.  Assessment & Plan:   Active Problems:   CKD (chronic kidney disease) stage 3, GFR 30-59 ml/min (HCC)   Atrial fibrillation (HCC)   Pulmonary hypertension (HCC)   Essential hypertension   Closed left hip fracture (HCC)   Intertrochanteric fracture (HCC)   Hypokalemia   Dilated cardiomyopathy (HCC)   CKD (chronic kidney disease), stage III (HCC)   Parkinson's disease (HCC)   Closed fracture of left femur (HCC)   Pain #1 left hip fracture status post mechanical fall no loss of consciousness.  Status post left intramedullary fixation of the left femur 05/20/2020.  DC narcotics patient is very sensitive. Tylenol 650 3 times daily. Weightbearing as tolerated with walker.  #2 permanent atrial fibrillation continue Eliquis and Cardizem.  #3 history of dilated cardiomyopathy with pulmonary hypertension -DC IV fluids  Last echocardiogram in November 2018 with ejection fraction 50 to 55%, mild aortic valve stenosis, mild mitral valve regurgitation.  #4 Parkinson's disease on Sinemet  #5 hypokalemia resolved potassium 3.7  #6 CKD stage IIIb-his baseline appears to be around 1.6.  His creatinine today is 1.6.  Nutrition Problem: Increased nutrient needs Etiology: hip fracture,post-op healing  Signs/Symptoms: estimated needs    Interventions: Ensure Surgery,MVI  Estimated body mass index is 29.42 kg/m as calculated from the following:   Height as of this encounter: 5\' 10"  (1.778 m).   Weight as of this encounter: 93 kg.  DVT prophylaxis: Heparin  code Status: Full code Family Communication: None at bedside   disposition Plan:  Status is: Inpatient  Dispo: The patient is from: Home              Anticipated d/c is to: SNF              Patient currently is not medically stable to d/c.   Difficult to place patient na   Consultants:   Ortho  Procedures: None Antimicrobials: None Subjective: Patient resting in bed in no acute distress is awake and alert and answer all my questions appropriately.  He is requesting with the oxygen to be removed Objective: Vitals:   05/20/20 2049 05/21/20 0004 05/21/20 0456 05/21/20 0949  BP: 130/70 116/66 119/67 132/68  Pulse: 75 87 74 80  Resp: 16 16 16 15   Temp: (!) 97.5 F (36.4 C) 97.8 F (36.6 C) 98 F (36.7 C) 97.6 F (36.4 C)  TempSrc: Oral Oral Oral Oral  SpO2: 99% 91% 98% 92%  Weight:   93 kg   Height:        Intake/Output Summary (Last 24 hours) at 05/21/2020 1242 Last data filed at 05/21/2020 1200 Gross per 24 hour  Intake 3135.1 ml  Output 2600 ml  Net 535.1 ml   Filed Weights   05/20/20 0502 05/20/20 1210 05/21/20 0456  Weight: 93.2 kg 93.2 kg 93 kg    Examination:  General exam: Appears calm and comfortable  Respiratory system: Clear to auscultation. Respiratory effort normal. Cardiovascular system: S1 & S2 heard, RRR. No JVD, murmurs, rubs, gallops or clicks. No pedal edema. Gastrointestinal system: Abdomen is nondistended, soft and nontender. No organomegaly or masses felt. Normal bowel sounds heard. Central nervous system: Alert  and oriented. No focal neurological deficits. Extremities: Left hip tenderness. Skin: No rashes, lesions or ulcers Psychiatry: Judgement and insight appear normal. Mood & affect appropriate.     Data Reviewed: I have personally reviewed following labs and imaging studies  CBC: Recent Labs  Lab 05/18/20 2049 05/19/20 0624 05/20/20 0211 05/21/20 0536  WBC 5.7 8.1 6.6 8.1  NEUTROABS 4.1  --  5.2 6.8  HGB 15.8 14.9 13.4 12.3*  HCT 48.8 46.0 41.4 39.8  MCV 90.5 91.6 92.4 95.2  PLT 169  148* 139* 400*   Basic Metabolic Panel: Recent Labs  Lab 05/18/20 2049 05/19/20 0624 05/19/20 1817 05/20/20 0211 05/21/20 0536  NA 138 136  --  137 139  K 2.9* 3.1* 3.4* 3.1* 3.7  CL 86* 89*  --  93* 97*  CO2 37* 32  --  31 29  GLUCOSE 105* 127*  --  116* 126*  BUN 39* 40*  --  36* 31*  CREATININE 1.90* 1.84*  --  1.83* 1.60*  CALCIUM 9.9 9.0  --  8.8* 8.8*  MG  --  2.6* 2.6* 2.5*  --   PHOS  --  5.0*  --  3.6  --    GFR: Estimated Creatinine Clearance: 40.8 mL/min (A) (by C-G formula based on SCr of 1.6 mg/dL (H)). Liver Function Tests: Recent Labs  Lab 05/19/20 0624 05/20/20 0211 05/21/20 0536  AST 36 24 22  ALT 9 <5 <5  ALKPHOS 128* 116 99  BILITOT 3.0* 4.4* 3.0*  PROT 7.4 6.8 6.6  ALBUMIN 3.9 3.6 3.3*   No results for input(s): LIPASE, AMYLASE in the last 168 hours. No results for input(s): AMMONIA in the last 168 hours. Coagulation Profile: No results for input(s): INR, PROTIME in the last 168 hours. Cardiac Enzymes: No results for input(s): CKTOTAL, CKMB, CKMBINDEX, TROPONINI in the last 168 hours. BNP (last 3 results) Recent Labs    10/10/19 1037 11/01/19 1322  PROBNP 1,547* 2,160*   HbA1C: No results for input(s): HGBA1C in the last 72 hours. CBG: No results for input(s): GLUCAP in the last 168 hours. Lipid Profile: No results for input(s): CHOL, HDL, LDLCALC, TRIG, CHOLHDL, LDLDIRECT in the last 72 hours. Thyroid Function Tests: No results for input(s): TSH, T4TOTAL, FREET4, T3FREE, THYROIDAB in the last 72 hours. Anemia Panel: No results for input(s): VITAMINB12, FOLATE, FERRITIN, TIBC, IRON, RETICCTPCT in the last 72 hours. Sepsis Labs: No results for input(s): PROCALCITON, LATICACIDVEN in the last 168 hours.  Recent Results (from the past 240 hour(s))  Resp Panel by RT-PCR (Flu A&B, Covid) Nasopharyngeal Swab     Status: None   Collection Time: 05/18/20 10:49 PM   Specimen: Nasopharyngeal Swab; Nasopharyngeal(NP) swabs in vial transport  medium  Result Value Ref Range Status   SARS Coronavirus 2 by RT PCR NEGATIVE NEGATIVE Final    Comment: (NOTE) SARS-CoV-2 target nucleic acids are NOT DETECTED.  The SARS-CoV-2 RNA is generally detectable in upper respiratory specimens during the acute phase of infection. The lowest concentration of SARS-CoV-2 viral copies this assay can detect is 138 copies/mL. A negative result does not preclude SARS-Cov-2 infection and should not be used as the sole basis for treatment or other patient management decisions. A negative result may occur with  improper specimen collection/handling, submission of specimen other than nasopharyngeal swab, presence of viral mutation(s) within the areas targeted by this assay, and inadequate number of viral copies(<138 copies/mL). A negative result must be combined with clinical observations, patient history, and epidemiological  information. The expected result is Negative.  Fact Sheet for Patients:  EntrepreneurPulse.com.au  Fact Sheet for Healthcare Providers:  IncredibleEmployment.be  This test is no t yet approved or cleared by the [place] FDA and  has been authorized for detection and/or diagnosis of SARS-CoV-2 by FDA under an Emergency Use Authorization (EUA). This EUA will remain  in effect (meaning this test can be used) for the duration of the COVID-19 declaration under Section 564(b)(1) of the Act, 21 U.S.C.section 360bbb-3(b)(1), unless the authorization is terminated  or revoked sooner.       Influenza A by PCR NEGATIVE NEGATIVE Final   Influenza B by PCR NEGATIVE NEGATIVE Final    Comment: (NOTE) The Xpert Xpress SARS-CoV-2/FLU/RSV plus assay is intended as an aid in the diagnosis of influenza from Nasopharyngeal swab specimens and should not be used as a sole basis for treatment. Nasal washings and aspirates are unacceptable for Xpert Xpress SARS-CoV-2/FLU/RSV testing.  Fact Sheet for  Patients: EntrepreneurPulse.com.au  Fact Sheet for Healthcare Providers: IncredibleEmployment.be  This test is not yet approved or cleared by the [place] FDA and has been authorized for detection and/or diagnosis of SARS-CoV-2 by FDA under an Emergency Use Authorization (EUA). This EUA will remain in effect (meaning this test can be used) for the duration of the COVID-19 declaration under Section 564(b)(1) of the Act, 21 U.S.C. section 360bbb-3(b)(1), unless the authorization is terminated or revoked.  Performed at [facility]   Surgical PCR screen     Status: None   Collection Time: 05/20/20  3:14 AM   Specimen: Nasal Mucosa; Nasal Swab  Result Value Ref Range Status   MRSA, PCR NEGATIVE NEGATIVE Final   Staphylococcus aureus NEGATIVE NEGATIVE Final    Comment: (NOTE) The Xpert SA Assay (FDA approved for NASAL specimens in patients 4 years of age and older), is one component of a comprehensive surveillance program. It is not intended to diagnose infection nor to guide or monitor treatment. Performed at [facility], [place] [address] [place], [place] [address]          Radiology Studies: Pelvis Portable  Result Date: 05/20/2020 CLINICAL DATA:  Status post left femoral IM nail. EXAM: PORTABLE PELVIS 1-2 VIEWS COMPARISON:  05/18/2020. FINDINGS: Surgical clips noted in the region of the prostate bed. ORIF left femur. Hardware intact. Anatomic alignment. IMPRESSION: ORIF left femur. Hardware intact. Anatomic alignment. Electronically Signed   By: Marcello Moores  Register   On: 05/20/2020 16:38   DG CHEST PORT 1 VIEW  Result Date: 05/19/2020 CLINICAL DATA:  Dilated cardiomyopathy EXAM: PORTABLE CHEST 1 VIEW COMPARISON:  February 20 ,2020 FINDINGS: The heart size and mediastinal contours are moderately enlarged. Aortic knob calcifications are seen. There is elevation of the right hemidiaphragm. Prominence of  the central pulmonary vasculature seen. No pleural effusion is noted. No acute osseous abnormality. IMPRESSION: Cardiomegaly and pulmonary vascular congestion. Electronically Signed   By: Prudencio Pair M.D.   On: 05/19/2020 19:04   DG C-Arm 1-60 Min-No Report  Result Date: 05/20/2020 Fluoroscopy was utilized by the requesting physician.  No radiographic interpretation.   DG HIP OPERATIVE UNILAT WITH PELVIS LEFT  Result Date: 05/20/2020 CLINICAL DATA:  Intramedullary nailing for fracture EXAM: OPERATIVE LEFT HIP  2 VIEWS TECHNIQUE: Fluoroscopic spot image(s) were submitted for interpretation post-operatively. COMPARISON:  May 18, 2020 FINDINGS: Frontal and lateral views were obtained. There is screw and nail fixation through intertrochanteric femur fracture on the left with alignment at the fracture site  near anatomic. Screw tip in proximal femoral head. No new fracture. No dislocation. There are seed implants in the prostate. IMPRESSION: Screw and nail fixation through intertrochanteric femur fracture on the left with alignment fracture site near anatomic. No new fracture. No dislocation. Screw tip in proximal femoral head. Electronically Signed   By: Lowella Grip III M.D.   On: 05/20/2020 15:21        Scheduled Meds: . apixaban  2.5 mg Oral BID  . carbidopa-levodopa  2 tablet Oral TID  . Chlorhexidine Gluconate Cloth  6 each Topical Daily  . cholecalciferol  1,000 Units Oral Daily  . diltiazem  120 mg Oral Daily  . docusate sodium  100 mg Oral BID  . feeding supplement  237 mL Oral BID BM  . gabapentin  300 mg Oral TID  . multivitamin with minerals  1 tablet Oral Daily  . mupirocin ointment  1 application Nasal BID  . senna  1 tablet Oral BID   Continuous Infusions: . methocarbamol (ROBAXIN) IV       LOS: 2 days   Georgette Shell, MD  05/21/2020, 12:42 PM

## 2020-05-21 NOTE — Anesthesia Postprocedure Evaluation (Signed)
Anesthesia Post Note  Patient: [name]  Procedure(s) Performed: INTRAMEDULLARY (IM) NAIL FEMORAL (Left Hip)     Patient location during evaluation: PACU Anesthesia Type: General Level of consciousness: awake and alert and oriented Pain management: pain level controlled Vital Signs Assessment: post-procedure vital signs reviewed and stable Respiratory status: spontaneous breathing, nonlabored ventilation and respiratory function stable Cardiovascular status: blood pressure returned to baseline Postop Assessment: no apparent nausea or vomiting Anesthetic complications: no   No complications documented.             Brennan Bailey

## 2020-05-21 NOTE — Progress Notes (Signed)
Occupational Therapy Evaluation  Patient lives with significant other [name] in a 1 story home with small step from garage to kitchen. At baseline patient likes being outside, uses cane for ambulation and is independent with self care. Currently patient having pain in L hip/leg describing spasm like pain despite premedicated before session. With use of bed pad patient total A x2 to pivot to edge of bed. With increase time to obtain balance patient able to scoot out to edge. With bed height elevate, min x1 and mod x1 and cues for body mechanics patient able to power up to standing. Patient transfer to recliner chair after ~5 small steps with walker and min A with poor eccentric control into chair. Patient does endorse [name] has a "bad low back" unsure how much physical assistance she can provide. Currently based on mobility and ADL impairment recommend short term rehab at discharge, if patient improve with mobility over next sessions may be able to D/C home with [facility].    05/21/20 1200  OT Visit Information  Last OT Received On 05/21/20  Assistance Needed +2  PT/OT/SLP Co-Evaluation/Treatment Yes  Reason for Co-Treatment For patient/therapist safety;To address functional/ADL transfers  OT goals addressed during session ADL's and self-care  History of Present Illness 85 y.o. male, with PMH of CKD 3, A. fib on Eliquis, Parkinson's, hypertension, CAD, dyslipidemia who presented to the ER on 05/18/2020 with hip pain after ground-level fall. Dx of L hip fx. s/p IM nail 05/20/20.  Precautions  Precautions Fall  Precaution Comments 2 falls in past 4 months  Restrictions  Weight Bearing Restrictions No  Other Position/Activity Restrictions WBAT LLE  Home Living  Family/patient expects to be discharged to: Private residence  Living Arrangements Spouse/significant other  Available Help at Discharge Family;Available 24 hours/day;Neighbor  Type of Home House  Home Access Stairs to enter  Entrance Stairs-Number  of Steps 1 small step from garage to [place] One level  Bathroom Programmer, applications (having higher toilet installed)  Home Equipment [place] - single point  Prior Function  Level of Independence Independent with assistive device(s)  Comments ambulates with cane  Communication  Communication No difficulties  Pain Assessment  Pain Assessment Faces  Faces Pain Scale 6  Pain Location L hip with activity  Pain Descriptors / Indicators Sore;Grimacing;Guarding  Pain Intervention(s) Premedicated before session  Cognition  Arousal/Alertness Awake/alert  Behavior During Therapy WFL for tasks assessed/performed  Overall Cognitive Status Within Functional Limits for tasks assessed  Upper Extremity Assessment  Upper Extremity Assessment Overall WFL for tasks assessed  Lower Extremity Assessment  Lower Extremity Assessment Defer to PT evaluation  ADL  Overall ADL's  Needs assistance/impaired  Eating/Feeding Independent;Sitting  Grooming Set up;Sitting  Upper Body Bathing Set up;Sitting  Lower Body Bathing Maximal assistance;Sitting/lateral leans;Sit to/from stand  Upper Body Dressing  Set up;Sitting  Lower Body Dressing Total assistance;Sitting/lateral leans;Sit to/from stand  Lower Body Dressing Details (indicate cue type and reason) to don socks  Toilet Transfer Moderate assistance;+2 for safety/equipment;Cueing for safety;Cueing for sequencing;RW  Toilet Transfer Details (indicate cue type and reason) bed height elevated, needing min x1 and mod x1 to power up to standing from edge of bed with cues for body mechanics. Patient able to take about 5 small steps forward with cues for sequencing before transferring to recliner. poor eccentric control.  Toileting- Clothing Manipulation and Hygiene Total assistance;Sitting/lateral lean;Sit to/from stand  Functional mobility during ADLs Moderate assistance;+2 for safety/equipment;Cueing for safety;Cueing  for sequencing;Rolling walker  General ADL Comments patient requiring increased assistance with self care due to pain impacting activity tolerance, balance, safety awareness  Bed Mobility  Overal bed mobility Needs Assistance  Bed Mobility Supine to Sit  Supine to sit +2 for physical assistance;Total assist;HOB elevated  General bed mobility comments assist to raise trunk, pivot hips, and support LLE  Transfers  Overall transfer level Needs assistance  Equipment used Rolling walker (2 wheeled)  Transfers Sit to/from Stand  Sit to Stand Mod assist;From elevated surface;+2 safety/equipment  General transfer comment assist to rise, VCs hand placement  Balance  Overall balance assessment Needs assistance  Sitting-balance support Feet supported  Sitting balance-Leahy Scale Fair  Standing balance support Bilateral upper extremity supported  Standing balance-Leahy Scale Poor  OT - End of Session  Equipment Utilized During Treatment Gait belt;Rolling walker  Activity Tolerance Patient tolerated treatment well  Patient left in chair;with call bell/phone within reach;with chair alarm set  Nurse Communication Mobility status  OT Assessment  OT Recommendation/Assessment Patient needs continued OT Services  OT Visit Diagnosis Unsteadiness on feet (R26.81);Other abnormalities of gait and mobility (R26.89);History of falling (Z91.81);Pain  Pain - Right/Left Left  Pain - part of body Hip  OT Problem List Decreased activity tolerance;Impaired balance (sitting and/or standing);Decreased safety awareness;Decreased knowledge of use of DME or AE;Pain  OT Plan  OT Frequency (ACUTE ONLY) Min 2X/week  OT Treatment/Interventions (ACUTE ONLY) Self-care/ADL training;DME and/or AE instruction;Therapeutic activities;Patient/family education;Balance training  AM-PAC OT "6 Clicks" Daily Activity Outcome Measure (Version 2)  Help from another person eating meals? 4  Help from another person taking care of  personal grooming? 3  Help from another person toileting, which includes using toliet, bedpan, or urinal? 2  Help from another person bathing (including washing, rinsing, drying)? 2  Help from another person to put on and taking off regular upper body clothing? 3  Help from another person to put on and taking off regular lower body clothing? 1  6 Click Score 15  OT Recommendation  Follow Up Recommendations SNF;Other (comment) (vs HH with s/o assist pending improvement)  OT Equipment 3 in 1 bedside commode;Other (comment) (rolling walker)  Individuals Consulted  Consulted and Agree with Results and Recommendations Patient  Acute Rehab OT Goals  Patient Stated Goal likes to be outside  OT Goal Formulation With patient  Time For Goal Achievement 06/04/20  Potential to Achieve Goals Good  OT Time Calculation  OT Start Time (ACUTE ONLY) 1005  OT Stop Time (ACUTE ONLY) 1032  OT Time Calculation (min) 27 min  OT General Charges  $OT Visit 1 Visit  OT Evaluation  $OT Eval Moderate Complexity 1 Mod  Written Expression  Dominant Hand  (did not specify)   [name] OT OT pager: [phone]

## 2020-05-21 NOTE — Progress Notes (Signed)
    Subjective:  Patient reports pain as moderate.  Denies N/V/CP/SOB.   Objective:   VITALS:   Vitals:   05/20/20 2049 05/21/20 0004 05/21/20 0456 05/21/20 0949  BP: 130/70 116/66 119/67 132/68  Pulse: 75 87 74 80  Resp: 16 16 16 15   Temp: (!) 97.5 F (36.4 C) 97.8 F (36.6 C) 98 F (36.7 C) 97.6 F (36.4 C)  TempSrc: Oral Oral Oral Oral  SpO2: 99% 91% 98% 92%  Weight:   93 kg   Height:        NAD ABD soft Neurovascular intact Sensation intact distally Intact pulses distally Dorsiflexion/Plantar flexion intact Incision: Dressing partially removed at distal end. Nurse is replacing dressing   Lab Results  Component Value Date   WBC 8.1 05/21/2020   HGB 12.3 (L) 05/21/2020   HCT 39.8 05/21/2020   MCV 95.2 05/21/2020   PLT 132 (L) 05/21/2020   BMET    Component Value Date/Time   NA 139 05/21/2020 0536   NA 139 02/10/2020 1129   K 3.7 05/21/2020 0536   CL 97 (L) 05/21/2020 0536   CO2 29 05/21/2020 0536   GLUCOSE 126 (H) 05/21/2020 0536   BUN 31 (H) 05/21/2020 0536   BUN 22 02/10/2020 1129   CREATININE 1.60 (H) 05/21/2020 0536   CALCIUM 8.8 (L) 05/21/2020 0536   GFRNONAA 43 (L) 05/21/2020 0536   GFRAA 48 (L) 02/10/2020 1129     Assessment/Plan: 1 Day Post-Op   Active Problems:   CKD (chronic kidney disease) stage 3, GFR 30-59 ml/min (HCC)   Atrial fibrillation (HCC)   Pulmonary hypertension (HCC)   Essential hypertension   Closed left hip fracture (HCC)   Intertrochanteric fracture (HCC)   Hypokalemia   Dilated cardiomyopathy (HCC)   CKD (chronic kidney disease), stage III ([place])   Parkinson's disease ([place])   Closed fracture of left femur ([place])   Pain   WBAT with walker DVT ppx: Apixaban, SCDs, TEDS PO pain control, no narcotics PT/OT Dispo: Planning     [name] 05/21/2020, 10:48 AM  [facility] is now [address] [place], [place], [place] [address] Phone:  [phone] [email or web address] Facebook  [employer]

## 2020-05-22 LAB — CBC WITH DIFFERENTIAL/PLATELET
Abs Immature Granulocytes: 0.03 10*3/uL (ref 0.00–0.07)
Basophils Absolute: 0.1 10*3/uL (ref 0.0–0.1)
Basophils Relative: 1 %
Eosinophils Absolute: 0.3 10*3/uL (ref 0.0–0.5)
Eosinophils Relative: 4 %
HCT: 34.2 % — ABNORMAL LOW (ref 39.0–52.0)
Hemoglobin: 10.8 g/dL — ABNORMAL LOW (ref 13.0–17.0)
Immature Granulocytes: 0 %
Lymphocytes Relative: 9 %
Lymphs Abs: 0.7 10*3/uL (ref 0.7–4.0)
MCH: 29.8 pg (ref 26.0–34.0)
MCHC: 31.6 g/dL (ref 30.0–36.0)
MCV: 94.5 fL (ref 80.0–100.0)
Monocytes Absolute: 0.6 10*3/uL (ref 0.1–1.0)
Monocytes Relative: 9 %
Neutro Abs: 5.6 10*3/uL (ref 1.7–7.7)
Neutrophils Relative %: 77 %
Platelets: 130 10*3/uL — ABNORMAL LOW (ref 150–400)
RBC: 3.62 MIL/uL — ABNORMAL LOW (ref 4.22–5.81)
RDW: 14.6 % (ref 11.5–15.5)
WBC: 7.2 10*3/uL (ref 4.0–10.5)
nRBC: 0 % (ref 0.0–0.2)

## 2020-05-22 LAB — COMPREHENSIVE METABOLIC PANEL
ALT: 5 U/L (ref 0–44)
AST: 16 U/L (ref 15–41)
Albumin: 3.1 g/dL — ABNORMAL LOW (ref 3.5–5.0)
Alkaline Phosphatase: 89 U/L (ref 38–126)
Anion gap: 10 (ref 5–15)
BUN: 41 mg/dL — ABNORMAL HIGH (ref 8–23)
CO2: 30 mmol/L (ref 22–32)
Calcium: 8.7 mg/dL — ABNORMAL LOW (ref 8.9–10.3)
Chloride: 97 mmol/L — ABNORMAL LOW (ref 98–111)
Creatinine, Ser: 1.63 mg/dL — ABNORMAL HIGH (ref 0.61–1.24)
GFR, Estimated: 42 mL/min — ABNORMAL LOW (ref >60)
Glucose, Bld: 107 mg/dL — ABNORMAL HIGH (ref 70–99)
Potassium: 3.3 mmol/L — ABNORMAL LOW (ref 3.5–5.1)
Sodium: 137 mmol/L (ref 135–145)
Total Bilirubin: 2.4 mg/dL — ABNORMAL HIGH (ref 0.3–1.2)
Total Protein: 6.2 g/dL — ABNORMAL LOW (ref 6.5–8.1)

## 2020-05-22 NOTE — Progress Notes (Signed)
Physical Therapy Treatment Patient Details Name: [name] MRN: [id number] DOB: 01/08/1936 Today's Date: 05/22/2020    History of Present Illness 85 y.o. male, with PMH of CKD 3, A. fib on Eliquis, Parkinson's, hypertension, CAD, dyslipidemia who presented to the ER on 05/18/2020 with hip pain after ground-level fall. Dx of L hip fx. s/p IM nail 05/20/20.    PT Comments    The patient required extensive mod/max assistance to sit up on bed edge then  Stand and take very small  Pivotal steps  to [facility]. Max assist of 2 to stand from [facility] and pivot to recliner. Patient is Unable to ambulate at this time. Both knees are flexed when standing and left knee buckles when weight is  Born on it when taking  Small steps. Patient had been incontinent of B/B prior to getting  OOB to [facility].   Patient  Currently will require  Much assistance of 2 persons if DC is to home.   Follow Up Recommendations  SNF;Supervision for mobility/OOB     Equipment Recommendations  Rolling walker with 5" wheels;3in1 (PT) if DC home  Recommend WC and cushion,    Recommendations for Other Services       Precautions / Restrictions Precautions Precautions: Fall Precaution Comments: 2 falls in past 4 months Restrictions Other Position/Activity Restrictions: WBAT LLE    Mobility  Bed Mobility   Bed Mobility: Supine to Sit     Supine to sit: +2 for physical assistance;Total assist;HOB elevated     General bed mobility comments: assist to raise trunk, pivot hips, and support LLE, use of bed pad to turn and scoot to bed edge    Transfers Overall transfer level: Needs assistance Equipment used: Rolling walker (2 wheeled) Transfers: Sit to/from Stand Sit to Stand: Mod assist;Max assist;+2 physical assistance;+2 safety/equipment;From elevated surface         General transfer comment: mod assist to  rise from raised bed, max from [facility]. Knees are flexed, supported right knee  when stepping with Left leg.  Patient only able to  take small shuffle steps to turn to [facility] then to recliner brought close to [facility].  Ambulation/Gait                 Stairs             Wheelchair Mobility    Modified Rankin (Stroke Patients Only)       Balance Overall balance assessment: Needs assistance Sitting-balance support: Feet supported;Bilateral upper extremity supported Sitting balance-Leahy Scale: Fair     Standing balance support: Bilateral upper extremity supported Standing balance-Leahy Scale: Poor Standing balance comment: reliant on UE and external support. leaning backwards                            Cognition Arousal/Alertness: Awake/alert Behavior During Therapy: WFL for tasks assessed/performed Overall Cognitive Status: Within Functional Limits for tasks assessed                                 General Comments: does not appear to acknowledge amount of assistance that he needs if [place] home. "I have close neighbors and they will help." Pointed out to patient that it took 2 medical persons to assist him with transfers and fro cleaning  him from having a BM, pt.had been incontinent of BM in bed and unaware of it.  Exercises General Exercises - Lower Extremity Heel Slides: AAROM;Left;10 reps;Supine Hip ABduction/ADduction: AAROM;Left;10 reps;Supine    General Comments        Pertinent Vitals/Pain Pain Score: 7  Pain Location: L hip with activity Pain Descriptors / Indicators: Sore;Grimacing;Guarding Pain Intervention(s): Monitored during session;Premedicated before session;Ice applied    Home Living                      Prior Function            PT Goals (current goals can now be found in the care plan section) Progress towards PT goals: Progressing toward goals    Frequency    Min 3X/week      PT Plan Current plan remains appropriate    Co-evaluation              AM-PAC PT "6 Clicks" Mobility   Outcome  Measure  Help needed turning from your back to your side while in a flat bed without using bedrails?: A Lot Help needed moving from lying on your back to sitting on the side of a flat bed without using bedrails?: A Lot Help needed moving to and from a bed to a chair (including a wheelchair)?: Total Help needed standing up from a chair using your arms (e.g., wheelchair or bedside chair)?: Total Help needed to walk in hospital room?: Total Help needed climbing 3-5 steps with a railing? : Total 6 Click Score: 8    End of Session Equipment Utilized During Treatment: Gait belt Activity Tolerance: Patient limited by pain;Patient limited by fatigue Patient left: in chair;with call bell/phone within reach;with chair alarm set Nurse Communication: Mobility status PT Visit Diagnosis: Difficulty in walking, not elsewhere classified (R26.2);Pain;Muscle weakness (generalized) (M62.81);History of falling (Z91.81) Pain - Right/Left: Left Pain - part of body: Hip     Time: 1132-1210 PT Time Calculation (min) (ACUTE ONLY): 38 min  Charges:  $Therapeutic Activity: 23-37 mins $Self Care/Home Management: [place] Pager [phone] Office [phone]    Claretha Cooper 05/22/2020, 1:46 PM

## 2020-05-22 NOTE — Care Management Important Message (Signed)
Important Message  Patient Details IM Letter given to the Patient. Name: [name] MRN: [id number] Date of Birth: February 17, 1936   Medicare Important Message Given:  Yes     Kerin Salen 05/22/2020, 10:44 AM

## 2020-05-22 NOTE — Progress Notes (Signed)
PROGRESS NOTE    [name]  RFF:[id number] DOB: 22-Aug-1935 DOA: 05/18/2020 PCP: Carol Ada, MD    Brief Narrative: 85 year old male with history of A. fib on Eliquis now on hold for hip surgery, stage IIIb CKD, dilated cardiomyopathy, essential hypertension, Parkinson's disease, prostate cancer admitted status post ground-level fall and left hip fracture.  Assessment & Plan:   Active Problems:   CKD (chronic kidney disease) stage 3, GFR 30-59 ml/min (HCC)   Atrial fibrillation (HCC)   Pulmonary hypertension (HCC)   Essential hypertension   Closed left hip fracture (HCC)   Intertrochanteric fracture (HCC)   Hypokalemia   Dilated cardiomyopathy (HCC)   CKD (chronic kidney disease), stage III (HCC)   Parkinson's disease (HCC)   Closed fracture of left femur (HCC)   Pain #1 left hip fracture status post mechanical fall no loss of consciousness.  Status post left intramedullary fixation of the left femur 05/20/2020.  DC narcotics patient is very sensitive. Tylenol 650 3 times daily. Weightbearing as tolerated with walker. Wife concerned patient hallucinating.  Discussed with Dr. Carles Collet.  I will DC Reglan and hold Neurontin for now.  PT recommends SNF.   #2 permanent atrial fibrillation continue Eliquis and Cardizem.  #3 history of dilated cardiomyopathy with pulmonary hypertension -DC IV fluids  Last echocardiogram in November 2018 with ejection fraction 50 to 55%, mild aortic valve stenosis, mild mitral valve regurgitation.  #4 Parkinson's disease on Sinemet  #5 hypokalemia resolved potassium 3.7  #6 CKD stage IIIb-his baseline appears to be around 1.6.  His creatinine today is 1.6.  Nutrition Problem: Increased nutrient needs Etiology: hip fracture,post-op healing  Signs/Symptoms: estimated needs    Interventions: Ensure Surgery,MVI  Estimated body mass index is 31.13 kg/m as calculated from the following:   Height as of this encounter: 5\' 10"  (1.778  m).   Weight as of this encounter: 98.4 kg.  DVT prophylaxis: Heparin  code Status: Full code Family Communication: None at bedside  disposition Plan:  Status is: Inpatient  Dispo: The patient is from: Home              Anticipated d/c is to: SNF              Patient currently is not medically stable to d/c.   Difficult to place patient na   Consultants:   Ortho  Procedures: None Antimicrobials: None Subjective: He is resting in bed significant other by the bedside concerned he is hallucinating Objective: Vitals:   05/21/20 1754 05/22/20 0104 05/22/20 0500 05/22/20 0950  BP: 130/81 127/70  130/63  Pulse: 78 69  74  Resp: 16 19  18   Temp: 97.8 F (36.6 C) 98 F (36.7 C)  97.8 F (36.6 C)  TempSrc: Oral Oral  Oral  SpO2: 91% 92%  94%  Weight:   98.4 kg   Height:        Intake/Output Summary (Last 24 hours) at 05/22/2020 1222 Last data filed at 05/22/2020 1152 Gross per 24 hour  Intake 600 ml  Output 950 ml  Net -350 ml   Filed Weights   05/20/20 1210 05/21/20 0456 05/22/20 0500  Weight: 93.2 kg 93 kg 98.4 kg    Examination:  General exam: Appears calm and comfortable  Respiratory system: Clear to auscultation. Respiratory effort normal. Cardiovascular system: S1 & S2 heard, RRR. No JVD, murmurs, rubs, gallops or clicks. No pedal edema. Gastrointestinal system: Abdomen is nondistended, soft and nontender. No organomegaly or masses felt. Normal bowel  sounds heard. Central nervous system: Alert and oriented. No focal neurological deficits. Extremities: Left hip tenderness.  Incision site covered with dressing clean dry intact Skin: No rashes, lesions or ulcers Psychiatry: Judgement and insight appear normal. Mood & affect appropriate.     Data Reviewed: I have personally reviewed following labs and imaging studies  CBC: Recent Labs  Lab 05/18/20 2049 05/19/20 0624 05/20/20 0211 05/21/20 0536 05/22/20 0544  WBC 5.7 8.1 6.6 8.1 7.2  NEUTROABS 4.1  --   5.2 6.8 5.6  HGB 15.8 14.9 13.4 12.3* 10.8*  HCT 48.8 46.0 41.4 39.8 34.2*  MCV 90.5 91.6 92.4 95.2 94.5  PLT 169 148* 139* 132* 825*   Basic Metabolic Panel: Recent Labs  Lab 05/18/20 2049 05/19/20 0624 05/19/20 1817 05/20/20 0211 05/21/20 0536 05/22/20 0544  NA 138 136  --  137 139 137  K 2.9* 3.1* 3.4* 3.1* 3.7 3.3*  CL 86* 89*  --  93* 97* 97*  CO2 37* 32  --  31 29 30   GLUCOSE 105* 127*  --  116* 126* 107*  BUN 39* 40*  --  36* 31* 41*  CREATININE 1.90* 1.84*  --  1.83* 1.60* 1.63*  CALCIUM 9.9 9.0  --  8.8* 8.8* 8.7*  MG  --  2.6* 2.6* 2.5*  --   --   PHOS  --  5.0*  --  3.6  --   --    GFR: Estimated Creatinine Clearance: 41.1 mL/min (A) (by C-G formula based on SCr of 1.63 mg/dL (H)). Liver Function Tests: Recent Labs  Lab 05/19/20 0624 05/20/20 0211 05/21/20 0536 05/22/20 0544  AST 36 24 22 16   ALT 9 <5 <5 5  ALKPHOS 128* 116 99 89  BILITOT 3.0* 4.4* 3.0* 2.4*  PROT 7.4 6.8 6.6 6.2*  ALBUMIN 3.9 3.6 3.3* 3.1*   No results for input(s): LIPASE, AMYLASE in the last 168 hours. No results for input(s): AMMONIA in the last 168 hours. Coagulation Profile: No results for input(s): INR, PROTIME in the last 168 hours. Cardiac Enzymes: No results for input(s): CKTOTAL, CKMB, CKMBINDEX, TROPONINI in the last 168 hours. BNP (last 3 results) Recent Labs    10/10/19 1037 11/01/19 1322  PROBNP 1,547* 2,160*   HbA1C: No results for input(s): HGBA1C in the last 72 hours. CBG: No results for input(s): GLUCAP in the last 168 hours. Lipid Profile: No results for input(s): CHOL, HDL, LDLCALC, TRIG, CHOLHDL, LDLDIRECT in the last 72 hours. Thyroid Function Tests: No results for input(s): TSH, T4TOTAL, FREET4, T3FREE, THYROIDAB in the last 72 hours. Anemia Panel: No results for input(s): VITAMINB12, FOLATE, FERRITIN, TIBC, IRON, RETICCTPCT in the last 72 hours. Sepsis Labs: No results for input(s): PROCALCITON, LATICACIDVEN in the last 168 hours.  Recent Results  (from the past 240 hour(s))  Resp Panel by RT-PCR (Flu A&B, Covid) Nasopharyngeal Swab     Status: None   Collection Time: 05/18/20 10:49 PM   Specimen: Nasopharyngeal Swab; Nasopharyngeal(NP) swabs in vial transport medium  Result Value Ref Range Status   SARS Coronavirus 2 by RT PCR NEGATIVE NEGATIVE Final    Comment: (NOTE) SARS-CoV-2 target nucleic acids are NOT DETECTED.  The SARS-CoV-2 RNA is generally detectable in upper respiratory specimens during the acute phase of infection. The lowest concentration of SARS-CoV-2 viral copies this assay can detect is 138 copies/mL. A negative result does not preclude SARS-Cov-2 infection and should not be used as the sole basis for treatment or other patient management decisions. A  negative result may occur with  improper specimen collection/handling, submission of specimen other than nasopharyngeal swab, presence of viral mutation(s) within the areas targeted by this assay, and inadequate number of viral copies(<138 copies/mL). A negative result must be combined with clinical observations, patient history, and epidemiological information. The expected result is Negative.  Fact Sheet for Patients:  EntrepreneurPulse.com.au  Fact Sheet for Healthcare Providers:  IncredibleEmployment.be  This test is no t yet approved or cleared by the [place] FDA and  has been authorized for detection and/or diagnosis of SARS-CoV-2 by FDA under an Emergency Use Authorization (EUA). This EUA will remain  in effect (meaning this test can be used) for the duration of the COVID-19 declaration under Section 564(b)(1) of the Act, 21 U.S.C.section 360bbb-3(b)(1), unless the authorization is terminated  or revoked sooner.       Influenza A by PCR NEGATIVE NEGATIVE Final   Influenza B by PCR NEGATIVE NEGATIVE Final    Comment: (NOTE) The Xpert Xpress SARS-CoV-2/FLU/RSV plus assay is intended as an aid in the  diagnosis of influenza from Nasopharyngeal swab specimens and should not be used as a sole basis for treatment. Nasal washings and aspirates are unacceptable for Xpert Xpress SARS-CoV-2/FLU/RSV testing.  Fact Sheet for Patients: EntrepreneurPulse.com.au  Fact Sheet for Healthcare Providers: IncredibleEmployment.be  This test is not yet approved or cleared by the [place] FDA and has been authorized for detection and/or diagnosis of SARS-CoV-2 by FDA under an Emergency Use Authorization (EUA). This EUA will remain in effect (meaning this test can be used) for the duration of the COVID-19 declaration under Section 564(b)(1) of the Act, 21 U.S.C. section 360bbb-3(b)(1), unless the authorization is terminated or revoked.  Performed at [facility]   Surgical PCR screen     Status: None   Collection Time: 05/20/20  3:14 AM   Specimen: Nasal Mucosa; Nasal Swab  Result Value Ref Range Status   MRSA, PCR NEGATIVE NEGATIVE Final   Staphylococcus aureus NEGATIVE NEGATIVE Final    Comment: (NOTE) The Xpert SA Assay (FDA approved for NASAL specimens in patients 45 years of age and older), is one component of a comprehensive surveillance program. It is not intended to diagnose infection nor to guide or monitor treatment. Performed at [facility], [place] [address], [place], [place] [address]          Radiology Studies: Pelvis Portable  Result Date: 05/20/2020 CLINICAL DATA:  Status post left femoral IM nail. EXAM: PORTABLE PELVIS 1-2 VIEWS COMPARISON:  05/18/2020. FINDINGS: Surgical clips noted in the region of the prostate bed. ORIF left femur. Hardware intact. Anatomic alignment. IMPRESSION: ORIF left femur. Hardware intact. Anatomic alignment. Electronically Signed   By: Marcello Moores  Register   On: 05/20/2020 16:38   DG C-Arm 1-60 Min-No Report  Result Date: 05/20/2020 Fluoroscopy was utilized by the requesting  physician.  No radiographic interpretation.   DG HIP OPERATIVE UNILAT WITH PELVIS LEFT  Result Date: 05/20/2020 CLINICAL DATA:  Intramedullary nailing for fracture EXAM: OPERATIVE LEFT HIP  2 VIEWS TECHNIQUE: Fluoroscopic spot image(s) were submitted for interpretation post-operatively. COMPARISON:  May 18, 2020 FINDINGS: Frontal and lateral views were obtained. There is screw and nail fixation through intertrochanteric femur fracture on the left with alignment at the fracture site near anatomic. Screw tip in proximal femoral head. No new fracture. No dislocation. There are seed implants in the prostate. IMPRESSION: Screw and nail fixation through intertrochanteric femur fracture on the left with alignment fracture site near  anatomic. No new fracture. No dislocation. Screw tip in proximal femoral head. Electronically Signed   By: Lowella Grip III M.D.   On: 05/20/2020 15:21        Scheduled Meds: . acetaminophen  650 mg Oral TID  . apixaban  2.5 mg Oral BID  . carbidopa-levodopa  2 tablet Oral TID  . Chlorhexidine Gluconate Cloth  6 each Topical Daily  . cholecalciferol  1,000 Units Oral Daily  . diltiazem  120 mg Oral Daily  . docusate sodium  100 mg Oral BID  . feeding supplement  237 mL Oral BID BM  . gabapentin  300 mg Oral TID  . multivitamin with minerals  1 tablet Oral Daily  . mupirocin ointment  1 application Nasal BID  . senna  1 tablet Oral BID   Continuous Infusions: . methocarbamol (ROBAXIN) IV       LOS: 3 days   Georgette Shell, MD  05/22/2020, 12:22 PM

## 2020-05-22 NOTE — Telephone Encounter (Signed)
Will need to talk to physicians in hospital about this

## 2020-05-22 NOTE — Telephone Encounter (Signed)
Left message on [place] her to contact the hospital physician about the hallucinations per Dr Tat. Advised her to call back with any questions or concerns.

## 2020-05-22 NOTE — TOC Initial Note (Addendum)
Transition of Care [facility]) - Initial/Assessment Note    Patient Details  Name: [name] MRN: [id number] Date of Birth: 1935/12/20  Transition of Care [facility]) CM/SW Contact:    Lynnell Catalan, RN Phone Number: 05/22/2020, 11:56 AM  Clinical Narrative:                 Spoke with pt and girlfriend at bedside for DC planning. Pt wants to go home. Girlfriend states that she can provide 24hr supervision. Choice offered for home health services and [facility] chosen. [facility] liaison given referral. 3in1 and RW orders received and referral given to [facility].  Addendum: PT has recommended wheelchair and hospital bed for home. The pt and girlfriend decline both.  Expected Discharge Plan: [place] Barriers to Discharge: No Barriers Identified   Patient Goals and CMS Choice Patient states their goals for this hospitalization and ongoing recovery are:: To go home CMS Medicare.gov Compare Post Acute Care list provided to:: Patient Choice offered to / list presented to : Patient  Expected Discharge Plan and Services Expected Discharge Plan: [place]   Discharge Planning Services: CM Consult Post Acute Care Choice: [place] arrangements for the past 2 months: Single Family Home                 DME Arranged: 3-N-1,Walker rolling DME Agency: Other - Comment Physicist, medical) Date DME Agency Contacted: 05/22/20 Time DME Agency Contacted: 24 Representative spoke with at DME Agency: Brenton Grills HH Arranged: PT [place]: [place] Date [place]: 05/22/20 Time [place]: 19 Representative spoke with at [place]: Tommi Rumps  Prior Living Arrangements/Services Living arrangements for the past 2 months: [place] Lives with:: Domestic Partner   Do you feel safe going back to the place where you live?: Yes      Need for Family Participation in Patient Care: Yes (Comment) Care giver support system in  place?: Yes (comment)   Criminal Activity/Legal Involvement Pertinent to Current Situation/Hospitalization: No - Comment as needed  Activities of Daily Living Home Assistive Devices/Equipment: Eyeglasses,Cane (specify quad or straight) ADL Screening (condition at time of admission) Patient's cognitive ability adequate to safely complete daily activities?: Yes Is the patient deaf or have difficulty hearing?: No Does the patient have difficulty seeing, even when wearing glasses/contacts?: No Does the patient have difficulty concentrating, remembering, or making decisions?: No Patient able to express need for assistance with ADLs?: Yes Does the patient have difficulty dressing or bathing?: No Independently performs ADLs?: Yes (appropriate for developmental age) Does the patient have difficulty walking or climbing stairs?: Yes Weakness of Legs: Both Weakness of Arms/Hands: None  Permission Sought/Granted Permission sought to share information with : Chartered certified accountant granted to share information with : Yes, Verbal Permission Granted     Permission granted to share info w AGENCY: [facility]        Emotional Assessment Appearance:: Appears stated age Attitude/Demeanor/Rapport: Gracious Affect (typically observed): Calm Orientation: : Oriented to Self,Oriented to Place Alcohol / Substance Use: Not Applicable    Admission diagnosis:  Pain [R52] Intertrochanteric fracture ([place]) [S72.143A] Closed left hip fracture (HCC) [S72.002A] Closed fracture of left femur, unspecified fracture morphology, unspecified portion of femur, initial encounter ([place]) [S72.92XA] Patient Active Problem List   Diagnosis Date Noted  . Closed fracture of left femur ([place])   . Pain   . Hypokalemia 05/19/2020  . Dilated cardiomyopathy ([place]) 05/19/2020  . CKD (chronic  kidney disease), stage III ([place]) 05/19/2020  . Parkinson's disease ([place]) 05/19/2020  . Closed left hip fracture ([place])  05/18/2020  . Intertrochanteric fracture ([place]) 05/18/2020  . Essential hypertension 07/21/2018  . Bilateral leg edema 07/21/2018  . Laboratory examination 05/14/2018  . Pulmonary hypertension ([place]) 03/16/2018  . Nonrheumatic mitral valve regurgitation 03/16/2018  . Tricuspid regurgitation 03/16/2018  . CKD (chronic kidney disease) stage 3, GFR 30-59 ml/min (HCC) 09/26/2017  . Chronic anticoagulation 09/26/2017  . Atrial fibrillation ([place]) 09/26/2017  . Atrial flutter ([place]) 02/11/2017  . Post PTCA 08/09/2016  . Dyspnea on exertion 08/07/2016  . Coronary artery disease involving native coronary artery of native heart without angina pectoris 08/07/2016   PCP:  Carol Ada, MD Pharmacy:   [facility] [address], [place] - [address] [place] [place] [address] Phone: [phone] Fax: [facility] [place], [place] [address] Phone: [phone] Fax: [phone]     Social Determinants of Health (SDOH) Interventions    Readmission Risk Interventions Readmission Risk Prevention Plan 05/22/2020  Transportation Screening Complete  PCP or Specialist Appt within 5-7 Days Complete  Home Care Screening Complete  Medication Review (RN CM) Complete  Some recent data might be hidden

## 2020-05-22 NOTE — Telephone Encounter (Signed)
Patient's partner called in stating the patient had surgery on his hip and is still in the hospital. She says since the surgery he has been having severe hallucinations. She is worried about these hallucinations.

## 2020-05-22 NOTE — Progress Notes (Signed)
Occupational Therapy Treatment Patient Details Name: [name] MRN: [id number] DOB: 01-01-1936 Today's Date: 05/22/2020    History of present illness 85 y.o. male, with PMH of CKD 3, A. fib on Eliquis, Parkinson's, hypertension, CAD, dyslipidemia who presented to the ER on 05/18/2020 with hip pain after ground-level fall. Dx of L hip fx. s/p IM nail 05/20/20.   OT comments  Patient up in recliner when therapist entered room and significant other present. Patient adamant about going home at discharge. Discussed home set up and DME needs with therapist recommended [facility] for toileting and in the shower for safety. Patient's SO reports she ordered him a lift chair that will arrive on Monday and reports he has slept in a recliner for years. Patient provided with verbal cues on hand placement and technique for sit to stand. Patient needed increased time to power up but able to stand with min guard only. Able to take a couple of steps to [facility] with increased time and lower himself. Discussed difficulty with toileting on [facility] and patient verbalized understanding. Patient's SO reports he will probably want to walk to the bathroom at home and therapist suggested placing a chair with arms midway for safety as patient has refused wheelchair. Patient able to rise from [facility] with increased time and return to recliner. His movements are slow but predominantly steady. Patient appeared to not be using enough arm strength for steps as elbows bent excessively. Therapist dropped RW height and patient stood again to test comfort of height. Patient returned to seated position and this time appeared to be out of breath and fatigued. Will continue to follow acutely. Short term rehab is the idea recommendation however if returns home recommend [facility] OT as well.   Follow Up Recommendations  SNF;Home health OT    Equipment Recommendations  3 in 1 bedside commode    Recommendations for Other Services      Precautions /  Restrictions Precautions Precautions: Fall Precaution Comments: 2 falls in past 4 months Restrictions Weight Bearing Restrictions: No Other Position/Activity Restrictions: WBAT LLE       Mobility Bed Mobility   Bed Mobility: Supine to Sit     Supine to sit: +2 for physical assistance;Total assist;HOB elevated     General bed mobility comments: OOB in recliner    Transfers Overall transfer level: Needs assistance Equipment used: Rolling walker (2 wheeled) Transfers: Sit to/from [facility] Sit to Stand: Min guard Stand pivot transfers: Min guard       General transfer comment: Patient up in recliner. Patient provided with verbal cues for scooting forward, standing and hand placement but no physical assistance to rise and take a few steps to [facility]. Min guard to transfer onto and up off of BSC as well. Increased time needed for power up and transition of hands from seated surface to walker but continued to only have min guard.    Balance Overall balance assessment: Needs assistance Sitting-balance support: Feet supported Sitting balance-Leahy Scale: Fair     Standing balance support: Bilateral upper extremity supported Standing balance-Leahy Scale: Poor Standing balance comment: reliant on UE                           ADL either performed or assessed with clinical judgement   ADL                           Toilet Transfer:  Min Lawyer Details (indicate cue type and reason): able to stand from recliner with RW and take steps to [facility] placed on his right side with min guard from therapist. Verbal cues for technique and increased use of his arms on walker.                 Vision Patient Visual Report: No change from baseline     Perception     Praxis      Cognition Arousal/Alertness: Awake/alert Behavior During Therapy: WFL for tasks assessed/performed Overall Cognitive Status: Within  Functional Limits for tasks assessed                                 General Comments: does not appear to acknowledge amount of assistance that he needs if [place] home. "I have close neighbors and they will help." Pointed out to patient that it took 2 medical persons to assist him with transfers and cleaning him from having a BM, pt.had been incontinent of BM in bed and unaware of it.        Exercises General Exercises - Lower Extremity Heel Slides: AAROM;Left;10 reps;Supine Hip ABduction/ADduction: AAROM;Left;10 reps;Supine   Shoulder Instructions       General Comments      Pertinent Vitals/ Pain       Pain Assessment: Faces Pain Score: 7  Faces Pain Scale: Hurts little more Pain Location: L hip with activity Pain Descriptors / Indicators: Sore;Grimacing;Guarding Pain Intervention(s): Limited activity within patient's tolerance  Home Living                                          Prior Functioning/Environment              Frequency  Min 2X/week        Progress Toward Goals  OT Goals(current goals can now be found in the care plan section)  Progress towards OT goals: Progressing toward goals  Acute Rehab OT Goals Patient Stated Goal: to go home OT Goal Formulation: With patient Time For Goal Achievement: 06/04/20 Potential to Achieve Goals: Good  Plan Discharge plan remains appropriate    Co-evaluation          OT goals addressed during session: ADL's and self-care (functional mobility)      AM-PAC OT "6 Clicks" Daily Activity     Outcome Measure   Help from another person eating meals?: None Help from another person taking care of personal grooming?: A Little Help from another person toileting, which includes using toliet, bedpan, or urinal?: A Lot Help from another person bathing (including washing, rinsing, drying)?: A Lot Help from another person to put on and taking off regular upper body clothing?: A  Little Help from another person to put on and taking off regular lower body clothing?: Total 6 Click Score: 15    End of Session Equipment Utilized During Treatment: Gait belt;Rolling walker  OT Visit Diagnosis: Unsteadiness on feet (R26.81);Other abnormalities of gait and mobility (R26.89);History of falling (Z91.81);Pain Pain - part of body: Hip   Activity Tolerance Patient limited by fatigue   Patient Left in chair;with call bell/phone within reach;with chair alarm set;with family/visitor present   Nurse Communication Mobility status        Time: 4970-2637 OT Time Calculation (min): 34 min  Charges: OT  General Charges $OT Visit: 1 Visit OT Treatments $Self Care/Home Management : 8-22 mins $Therapeutic Activity: 8-22 mins  Derl Barrow, OTR/L [place]  Office [phone] Pager: [place] 05/22/2020, 3:11 PM

## 2020-05-23 DIAGNOSIS — J986 Disorders of diaphragm: Secondary | ICD-10-CM

## 2020-05-23 LAB — COMPREHENSIVE METABOLIC PANEL
ALT: 9 U/L (ref 0–44)
AST: 24 U/L (ref 15–41)
Albumin: 3.6 g/dL (ref 3.5–5.0)
Alkaline Phosphatase: 107 U/L (ref 38–126)
Anion gap: 11 (ref 5–15)
BUN: 35 mg/dL — ABNORMAL HIGH (ref 8–23)
CO2: 29 mmol/L (ref 22–32)
Calcium: 9.3 mg/dL (ref 8.9–10.3)
Chloride: 100 mmol/L (ref 98–111)
Creatinine, Ser: 1.23 mg/dL (ref 0.61–1.24)
GFR, Estimated: 59 mL/min — ABNORMAL LOW (ref >60)
Glucose, Bld: 116 mg/dL — ABNORMAL HIGH (ref 70–99)
Potassium: 3.2 mmol/L — ABNORMAL LOW (ref 3.5–5.1)
Sodium: 140 mmol/L (ref 135–145)
Total Bilirubin: 4 mg/dL — ABNORMAL HIGH (ref 0.3–1.2)
Total Protein: 7.2 g/dL (ref 6.5–8.1)

## 2020-05-23 LAB — URINALYSIS, ROUTINE W REFLEX MICROSCOPIC
Bacteria, UA: NONE SEEN
Bilirubin Urine: NEGATIVE
Glucose, UA: 50 mg/dL — AB
Hgb urine dipstick: NEGATIVE
Ketones, ur: 5 mg/dL — AB
Leukocytes,Ua: NEGATIVE
Nitrite: NEGATIVE
Protein, ur: 100 mg/dL — AB
Specific Gravity, Urine: 1.016 (ref 1.005–1.030)
pH: 5 (ref 5.0–8.0)

## 2020-05-23 LAB — CBC WITH DIFFERENTIAL/PLATELET
Abs Immature Granulocytes: 0.02 10*3/uL (ref 0.00–0.07)
Basophils Absolute: 0 10*3/uL (ref 0.0–0.1)
Basophils Relative: 1 %
Eosinophils Absolute: 0.1 10*3/uL (ref 0.0–0.5)
Eosinophils Relative: 1 %
HCT: 37.2 % — ABNORMAL LOW (ref 39.0–52.0)
Hemoglobin: 12 g/dL — ABNORMAL LOW (ref 13.0–17.0)
Immature Granulocytes: 0 %
Lymphocytes Relative: 7 %
Lymphs Abs: 0.4 10*3/uL — ABNORMAL LOW (ref 0.7–4.0)
MCH: 30.1 pg (ref 26.0–34.0)
MCHC: 32.3 g/dL (ref 30.0–36.0)
MCV: 93.2 fL (ref 80.0–100.0)
Monocytes Absolute: 0.5 10*3/uL (ref 0.1–1.0)
Monocytes Relative: 7 %
Neutro Abs: 5.7 10*3/uL (ref 1.7–7.7)
Neutrophils Relative %: 84 %
Platelets: 156 10*3/uL (ref 150–400)
RBC: 3.99 MIL/uL — ABNORMAL LOW (ref 4.22–5.81)
RDW: 14.6 % (ref 11.5–15.5)
WBC: 6.8 10*3/uL (ref 4.0–10.5)
nRBC: 0 % (ref 0.0–0.2)

## 2020-05-23 NOTE — Progress Notes (Signed)
PROGRESS NOTE    [name]  UQJ:[id number] DOB: 07-21-1935 DOA: 05/18/2020 PCP: Carol Ada, MD    Brief Narrative: 85 year old male with history of A. fib on Eliquis now on hold for hip surgery, stage IIIb CKD, dilated cardiomyopathy, essential hypertension, Parkinson's disease, prostate cancer admitted status post ground-level fall and left hip fracture.  Assessment & Plan:   Active Problems:   CKD (chronic kidney disease) stage 3, GFR 30-59 ml/min (HCC)   Atrial fibrillation (HCC)   Pulmonary hypertension (HCC)   Essential hypertension   Closed left hip fracture (HCC)   Intertrochanteric fracture (HCC)   Hypokalemia   Dilated cardiomyopathy (HCC)   CKD (chronic kidney disease), stage III (HCC)   Parkinson's disease (HCC)   Closed fracture of left femur (HCC)   Pain #1 left hip fracture status post mechanical fall no loss of consciousness.  Status post left intramedullary fixation of the left femur 05/20/2020.  DC narcotics patient is very sensitive. Tylenol 650 3 times daily. Weightbearing as tolerated with walker. Wife concerned patient hallucinating.  Discussed with Dr. Carles Collet.  I will DC Reglan and hold Neurontin for now.  PT recommends SNF. Patient and his significant other wants to take him home.  However she is waiting for some supplies to get home before patient can reach home.  She is not ready to accept him today as she does not have all the supplies she needs.  #2 permanent atrial fibrillation continue Eliquis and Cardizem.  #3 history of dilated cardiomyopathy with pulmonary hypertension -DC IV fluids  Last echocardiogram in November 2018 with ejection fraction 50 to 55%, mild aortic valve stenosis, mild mitral valve regurgitation.  #4 Parkinson's disease on Sinemet  #5 hypokalemia resolved potassium 3.7  #6 CKD stage IIIb-his baseline appears to be around 1.6.  His creatinine today is 1.6.  Labs pending for today.  Nutrition Problem: Increased  nutrient needs Etiology: hip fracture,post-op healing  Signs/Symptoms: estimated needs    Interventions: Ensure Surgery,MVI  Estimated body mass index is 31.13 kg/m as calculated from the following:   Height as of this encounter: 5\' 10"  (1.778 m).   Weight as of this encounter: 98.4 kg.  DVT prophylaxis: Heparin  code Status: Full code Family Communication: None at bedside  disposition Plan:  Status is: Inpatient  Dispo: The patient is from: Home              Anticipated d/c is to: SNF              Patient currently is not medically stable to d/c.   Difficult to place patient na   Consultants:   Ortho  Procedures: None Antimicrobials: None Subjective: He is resting in bed he is awake alert he has occasional periods of confusion I have held the Neurontin and stop the Reglan yesterday. UA ordered. Objective: Vitals:   05/22/20 0500 05/22/20 0950 05/22/20 1712 05/23/20 0104  BP:  130/63 (!) 144/67 (!) 145/67  Pulse:  74 71 74  Resp:  18 18 18   Temp:  97.8 F (36.6 C) 97.8 F (36.6 C) 97.8 F (36.6 C)  TempSrc:  Oral Oral Oral  SpO2:  94% 95% 90%  Weight: 98.4 kg     Height:        Intake/Output Summary (Last 24 hours) at 05/23/2020 1053 Last data filed at 05/23/2020 0655 Gross per 24 hour  Intake -  Output 750 ml  Net -750 ml   Filed Weights   05/20/20 1210 05/21/20  5638 05/22/20 0500  Weight: 93.2 kg 93 kg 98.4 kg    Examination:  General exam: Appears calm and comfortable  Respiratory system: Clear to auscultation. Respiratory effort normal. Cardiovascular system: S1 & S2 heard, RRR. No JVD, murmurs, rubs, gallops or clicks. No pedal edema. Gastrointestinal system: Abdomen is nondistended, soft and nontender. No organomegaly or masses felt. Normal bowel sounds heard. Central nervous system: Alert and oriented. No focal neurological deficits. Extremities: Left hip tenderness.  Incision site covered with dressing clean dry intact Skin: No rashes,  lesions or ulcers Psychiatry: Judgement and insight appear normal. Mood & affect appropriate.     Data Reviewed: I have personally reviewed following labs and imaging studies  CBC: Recent Labs  Lab 05/18/20 2049 05/19/20 0624 05/20/20 0211 05/21/20 0536 05/22/20 0544  WBC 5.7 8.1 6.6 8.1 7.2  NEUTROABS 4.1  --  5.2 6.8 5.6  HGB 15.8 14.9 13.4 12.3* 10.8*  HCT 48.8 46.0 41.4 39.8 34.2*  MCV 90.5 91.6 92.4 95.2 94.5  PLT 169 148* 139* 132* 937*   Basic Metabolic Panel: Recent Labs  Lab 05/18/20 2049 05/19/20 0624 05/19/20 1817 05/20/20 0211 05/21/20 0536 05/22/20 0544  NA 138 136  --  137 139 137  K 2.9* 3.1* 3.4* 3.1* 3.7 3.3*  CL 86* 89*  --  93* 97* 97*  CO2 37* 32  --  31 29 30   GLUCOSE 105* 127*  --  116* 126* 107*  BUN 39* 40*  --  36* 31* 41*  CREATININE 1.90* 1.84*  --  1.83* 1.60* 1.63*  CALCIUM 9.9 9.0  --  8.8* 8.8* 8.7*  MG  --  2.6* 2.6* 2.5*  --   --   PHOS  --  5.0*  --  3.6  --   --    GFR: Estimated Creatinine Clearance: 41.1 mL/min (A) (by C-G formula based on SCr of 1.63 mg/dL (H)). Liver Function Tests: Recent Labs  Lab 05/19/20 0624 05/20/20 0211 05/21/20 0536 05/22/20 0544  AST 36 24 22 16   ALT 9 <5 <5 5  ALKPHOS 128* 116 99 89  BILITOT 3.0* 4.4* 3.0* 2.4*  PROT 7.4 6.8 6.6 6.2*  ALBUMIN 3.9 3.6 3.3* 3.1*   No results for input(s): LIPASE, AMYLASE in the last 168 hours. No results for input(s): AMMONIA in the last 168 hours. Coagulation Profile: No results for input(s): INR, PROTIME in the last 168 hours. Cardiac Enzymes: No results for input(s): CKTOTAL, CKMB, CKMBINDEX, TROPONINI in the last 168 hours. BNP (last 3 results) Recent Labs    10/10/19 1037 11/01/19 1322  PROBNP 1,547* 2,160*   HbA1C: No results for input(s): HGBA1C in the last 72 hours. CBG: No results for input(s): GLUCAP in the last 168 hours. Lipid Profile: No results for input(s): CHOL, HDL, LDLCALC, TRIG, CHOLHDL, LDLDIRECT in the last 72  hours. Thyroid Function Tests: No results for input(s): TSH, T4TOTAL, FREET4, T3FREE, THYROIDAB in the last 72 hours. Anemia Panel: No results for input(s): VITAMINB12, FOLATE, FERRITIN, TIBC, IRON, RETICCTPCT in the last 72 hours. Sepsis Labs: No results for input(s): PROCALCITON, LATICACIDVEN in the last 168 hours.  Recent Results (from the past 240 hour(s))  Resp Panel by RT-PCR (Flu A&B, Covid) Nasopharyngeal Swab     Status: None   Collection Time: 05/18/20 10:49 PM   Specimen: Nasopharyngeal Swab; Nasopharyngeal(NP) swabs in vial transport medium  Result Value Ref Range Status   SARS Coronavirus 2 by RT PCR NEGATIVE NEGATIVE Final    Comment: (NOTE)  SARS-CoV-2 target nucleic acids are NOT DETECTED.  The SARS-CoV-2 RNA is generally detectable in upper respiratory specimens during the acute phase of infection. The lowest concentration of SARS-CoV-2 viral copies this assay can detect is 138 copies/mL. A negative result does not preclude SARS-Cov-2 infection and should not be used as the sole basis for treatment or other patient management decisions. A negative result may occur with  improper specimen collection/handling, submission of specimen other than nasopharyngeal swab, presence of viral mutation(s) within the areas targeted by this assay, and inadequate number of viral copies(<138 copies/mL). A negative result must be combined with clinical observations, patient history, and epidemiological information. The expected result is Negative.  Fact Sheet for Patients:  EntrepreneurPulse.com.au  Fact Sheet for Healthcare Providers:  IncredibleEmployment.be  This test is no t yet approved or cleared by the [place] FDA and  has been authorized for detection and/or diagnosis of SARS-CoV-2 by FDA under an Emergency Use Authorization (EUA). This EUA will remain  in effect (meaning this test can be used) for the duration of the COVID-19  declaration under Section 564(b)(1) of the Act, 21 U.S.C.section 360bbb-3(b)(1), unless the authorization is terminated  or revoked sooner.       Influenza A by PCR NEGATIVE NEGATIVE Final   Influenza B by PCR NEGATIVE NEGATIVE Final    Comment: (NOTE) The Xpert Xpress SARS-CoV-2/FLU/RSV plus assay is intended as an aid in the diagnosis of influenza from Nasopharyngeal swab specimens and should not be used as a sole basis for treatment. Nasal washings and aspirates are unacceptable for Xpert Xpress SARS-CoV-2/FLU/RSV testing.  Fact Sheet for Patients: EntrepreneurPulse.com.au  Fact Sheet for Healthcare Providers: IncredibleEmployment.be  This test is not yet approved or cleared by the [place] FDA and has been authorized for detection and/or diagnosis of SARS-CoV-2 by FDA under an Emergency Use Authorization (EUA). This EUA will remain in effect (meaning this test can be used) for the duration of the COVID-19 declaration under Section 564(b)(1) of the Act, 21 U.S.C. section 360bbb-3(b)(1), unless the authorization is terminated or revoked.  Performed at [facility]   Surgical PCR screen     Status: None   Collection Time: 05/20/20  3:14 AM   Specimen: Nasal Mucosa; Nasal Swab  Result Value Ref Range Status   MRSA, PCR NEGATIVE NEGATIVE Final   Staphylococcus aureus NEGATIVE NEGATIVE Final    Comment: (NOTE) The Xpert SA Assay (FDA approved for NASAL specimens in patients 92 years of age and older), is one component of a comprehensive surveillance program. It is not intended to diagnose infection nor to guide or monitor treatment. Performed at [facility], [place] [address], [place], [place] [address]          Radiology Studies: No results found.      Scheduled Meds: . acetaminophen  650 mg Oral TID  . apixaban  2.5 mg Oral BID  . carbidopa-levodopa  2 tablet Oral TID  .  Chlorhexidine Gluconate Cloth  6 each Topical Daily  . cholecalciferol  1,000 Units Oral Daily  . diltiazem  120 mg Oral Daily  . docusate sodium  100 mg Oral BID  . feeding supplement  237 mL Oral BID BM  . multivitamin with minerals  1 tablet Oral Daily  . mupirocin ointment  1 application Nasal BID  . senna  1 tablet Oral BID   Continuous Infusions: . methocarbamol (ROBAXIN) IV       LOS: 4 days   Benjamine Mola  Orlie Pollen, MD  05/23/2020, 10:53 AM

## 2020-05-23 NOTE — Progress Notes (Signed)
Physical Therapy Treatment Patient Details Name: [name] MRN: [id number] DOB: Apr 07, 1935 Today's Date: 05/23/2020    History of Present Illness 85 y.o. male, with PMH of CKD 3, A. fib on Eliquis, Parkinson's, hypertension, CAD, dyslipidemia who presented to the ER on 05/18/2020 with hip pain after ground-level fall. Dx of L hip fx. s/p IM nail 05/20/20.    PT Comments    Pt tolerated increased activity level today. He ambulated 35' with RW with min A to manage RW. Instructed pt and his significant other, Linda, in LLE exercises.   Follow Up Recommendations  Supervision for mobility/OOB;Home health PT;Supervision/Assistance - 24 hour (Pt's SO Linda plans to take pt home, she declined SNF.)     Equipment Recommendations  Rolling walker with 5" wheels;3in1 (PT)    Recommendations for Other Services       Precautions / Restrictions Precautions Precautions: Fall Precaution Comments: 2 falls in past 4 months Restrictions Weight Bearing Restrictions: No Other Position/Activity Restrictions: WBAT LLE    Mobility  Bed Mobility Overal bed mobility: Needs Assistance Bed Mobility: Supine to Sit     Supine to sit: Mod assist;HOB elevated     General bed mobility comments: MOd assist for assistance with LLE and pivoting of hips to transfer to side of bed.    Transfers Overall transfer level: Needs assistance Equipment used: Rolling walker (2 wheeled) Transfers: Sit to/from [facility] Sit to Stand: From elevated surface;Min assist Stand pivot transfers: Min assist       General transfer comment: Min A to power up into standing from elevated bed height, with RW.  verbal cues for walker management.  Ambulation/Gait Ambulation/Gait assistance: Min guard;Min assist Gait Distance (Feet): 30 Feet Assistive device: Rolling walker (2 wheeled) Gait Pattern/deviations: Step-to pattern;Decreased step length - right;Decreased step length - left      General Gait Details: VCs sequencing, distance limited by pain and fatigue, min A to manage RW   Stairs             Wheelchair Mobility    Modified Rankin (Stroke Patients Only)       Balance Overall balance assessment: Needs assistance Sitting-balance support: No upper extremity supported Sitting balance-Leahy Scale: Fair     Standing balance support: Bilateral upper extremity supported Standing balance-Leahy Scale: Poor Standing balance comment: reliant on UE                            Cognition Arousal/Alertness: Awake/alert Behavior During Therapy: WFL for tasks assessed/performed Overall Cognitive Status: Within Functional Limits for tasks assessed                                 General Comments: Reports of decreased orientation and agitation last night per chart. Pt able to follow commands during this session, is aware he's in the hospital but exhibits some mild confusion.      Exercises Total Joint Exercises Ankle Circles/Pumps: AROM;Both;Supine;20 reps Short Arc Quad: AROM;Left;10 reps;Supine Heel Slides: AAROM;Left;10 reps;Supine Hip ABduction/ADduction: AAROM;Left;10 reps;Supine Long Arc Quad: AROM;Left;10 reps;Seated    General Comments        Pertinent Vitals/Pain Pain Assessment: Faces Faces Pain Scale: Hurts even more Pain Location: L hip with activity Pain Descriptors / Indicators: Sore;Grimacing;Guarding Pain Intervention(s): Patient requesting pain meds-RN notified;RN gave pain meds during session;Repositioned    Home Living  Prior Function            PT Goals (current goals can now be found in the care plan section) Acute Rehab PT Goals Patient Stated Goal: to go home PT Goal Formulation: With patient/family Time For Goal Achievement: 06/04/20 Progress towards PT goals: Progressing toward goals    Frequency    Min 3X/week      PT Plan Current plan remains  appropriate    Co-evaluation       OT goals addressed during session:  (functional mobility)      AM-PAC PT "6 Clicks" Mobility   Outcome Measure  Help needed turning from your back to your side while in a flat bed without using bedrails?: A Lot Help needed moving from lying on your back to sitting on the side of a flat bed without using bedrails?: A Lot Help needed moving to and from a bed to a chair (including a wheelchair)?: A Little Help needed standing up from a chair using your arms (e.g., wheelchair or bedside chair)?: A Little Help needed to walk in hospital room?: A Little Help needed climbing 3-5 steps with a railing? : A Lot 6 Click Score: 15    End of Session Equipment Utilized During Treatment: Gait belt Activity Tolerance: Patient limited by pain;Patient limited by fatigue Patient left: in chair;with call bell/phone within reach;with chair alarm set;with family/visitor present Nurse Communication: Mobility status PT Visit Diagnosis: Difficulty in walking, not elsewhere classified (R26.2);Pain;Muscle weakness (generalized) (M62.81);History of falling (Z91.81) Pain - Right/Left: Left Pain - part of body: Hip     Time: 2992-4268 PT Time Calculation (min) (ACUTE ONLY): 26 min  Charges:  $Gait Training: 8-22 mins $Therapeutic Exercise: 8-22 mins                     Blondell Reveal Kistler PT 05/23/2020  Acute Rehabilitation Services Pager [phone] Office [phone]

## 2020-05-23 NOTE — Progress Notes (Signed)
Pt was A/Ox4 at start of shift but is now only oriented to self. Pt is agitated trying to get out of bed, refusing lab draw, and threw tele monitor across room. MD notified.

## 2020-05-23 NOTE — Progress Notes (Signed)
Occupational Therapy Treatment Patient Details Name: [name] MRN: [id number] DOB: [birth year] Today's Date: 05/23/2020    History of present illness 85 y.o. male, with PMH of CKD 3, A. fib on Eliquis, Parkinson's, hypertension, CAD, dyslipidemia who presented to the ER on 05/18/2020 with hip pain after ground-level fall. Dx of L hip fx. s/p IM nail 05/20/20.   OT comments  Treatment focused on functional mobility as patient is adamant about going home. Mod assist to transfer to side of bed. Min guard from standing from elevated bed height and min assist to ambulate approx 4 feet to recliner. Patient able to control descent into chair. Mild dyspnea with activity that he exhibited during yesterday's treatment as well. Though today he is wheezy. Cont POC.   Follow Up Recommendations  SNF;Home health OT    Equipment Recommendations  3 in 1 bedside commode    Recommendations for Other Services      Precautions / Restrictions Precautions Precautions: Fall Precaution Comments: 2 falls in past 4 months Restrictions Weight Bearing Restrictions: No Other Position/Activity Restrictions: WBAT LLE       Mobility Bed Mobility Overal bed mobility: Needs Assistance Bed Mobility: Supine to Sit     Supine to sit: Mod assist;HOB elevated     General bed mobility comments: MOd assist for assistance with LLE and pivoting of hips to transfer to side of bed.    Transfers Overall transfer level: Needs assistance Equipment used: Rolling walker (2 wheeled) Transfers: Sit to/from [facility] Sit to Stand: Min guard;From elevated surface Stand pivot transfers: Min assist       General transfer comment: Min guard, with increased time to power up into standing from elevated bed height, with RW. Min assist to take steps as patient mildly unsteady. verbal cues for walker management.    Balance Overall balance assessment: No apparent balance deficits (not formally  assessed) Sitting-balance support: No upper extremity supported Sitting balance-Leahy Scale: Fair     Standing balance support: Bilateral upper extremity supported Standing balance-Leahy Scale: Poor Standing balance comment: reliant on UE                           ADL either performed or assessed with clinical judgement   ADL                                               Vision Patient Visual Report: No change from baseline     Perception     Praxis      Cognition Arousal/Alertness: Awake/alert Behavior During Therapy: WFL for tasks assessed/performed Overall Cognitive Status: Within Functional Limits for tasks assessed                                 General Comments: Reports of decreased orientation and agitation last night per chart. However patient alert to self and situation this a.m.        Exercises     Shoulder Instructions       General Comments      Pertinent Vitals/ Pain       Pain Assessment: Faces Faces Pain Scale: Hurts little more Pain Location: L hip with activity Pain Descriptors / Indicators: Sore;Grimacing;Guarding Pain Intervention(s): Limited activity within patient's tolerance  Home  Living                                          Prior Functioning/Environment              Frequency  Min 2X/week        Progress Toward Goals  OT Goals(current goals can now be found in the care plan section)  Progress towards OT goals: Progressing toward goals  Acute Rehab OT Goals Patient Stated Goal: to go home OT Goal Formulation: With patient Time For Goal Achievement: 06/04/20 Potential to Achieve Goals: Good  Plan Discharge plan remains appropriate    Co-evaluation          OT goals addressed during session:  (functional mobility)      AM-PAC OT "6 Clicks" Daily Activity     Outcome Measure   Help from another person eating meals?: None Help from another  person taking care of personal grooming?: A Little Help from another person toileting, which includes using toliet, bedpan, or urinal?: A Lot Help from another person bathing (including washing, rinsing, drying)?: A Lot Help from another person to put on and taking off regular upper body clothing?: A Little Help from another person to put on and taking off regular lower body clothing?: Total 6 Click Score: 15    End of Session Equipment Utilized During Treatment: Gait belt;Rolling walker  OT Visit Diagnosis: Unsteadiness on feet (R26.81);Other abnormalities of gait and mobility (R26.89);History of falling (Z91.81);Pain Pain - Right/Left: Left Pain - part of body: Hip   Activity Tolerance Patient tolerated treatment well   Patient Left in chair;with call bell/phone within reach;with chair alarm set   Nurse Communication Mobility status        Time: 9629-5284 OT Time Calculation (min): 13 min  Charges: OT General Charges $OT Visit: 1 Visit OT Treatments $Therapeutic Activity: 8-22 mins  Iley Deignan, OTR/L [place]  Office [phone] Pager: [place] 05/23/2020, 1:48 PM

## 2020-05-24 HISTORY — PX: OTHER SURGICAL HISTORY: SHX169

## 2020-05-24 LAB — COMPREHENSIVE METABOLIC PANEL
ALT: 10 U/L (ref 0–44)
AST: 32 U/L (ref 15–41)
Albumin: 3.4 g/dL — ABNORMAL LOW (ref 3.5–5.0)
Alkaline Phosphatase: 103 U/L (ref 38–126)
Anion gap: 10 (ref 5–15)
BUN: 29 mg/dL — ABNORMAL HIGH (ref 8–23)
CO2: 31 mmol/L (ref 22–32)
Calcium: 9.4 mg/dL (ref 8.9–10.3)
Chloride: 102 mmol/L (ref 98–111)
Creatinine, Ser: 1.16 mg/dL (ref 0.61–1.24)
GFR, Estimated: >60 mL/min (ref >60)
Glucose, Bld: 105 mg/dL — ABNORMAL HIGH (ref 70–99)
Potassium: 3.4 mmol/L — ABNORMAL LOW (ref 3.5–5.1)
Sodium: 143 mmol/L (ref 135–145)
Total Bilirubin: 5.1 mg/dL — ABNORMAL HIGH (ref 0.3–1.2)
Total Protein: 6.9 g/dL (ref 6.5–8.1)

## 2020-05-24 LAB — CBC WITH DIFFERENTIAL/PLATELET
Abs Immature Granulocytes: 0.01 10*3/uL (ref 0.00–0.07)
Basophils Absolute: 0 10*3/uL (ref 0.0–0.1)
Basophils Relative: 1 %
Eosinophils Absolute: 0.1 10*3/uL (ref 0.0–0.5)
Eosinophils Relative: 1 %
HCT: 36.9 % — ABNORMAL LOW (ref 39.0–52.0)
Hemoglobin: 11.6 g/dL — ABNORMAL LOW (ref 13.0–17.0)
Immature Granulocytes: 0 %
Lymphocytes Relative: 6 %
Lymphs Abs: 0.4 10*3/uL — ABNORMAL LOW (ref 0.7–4.0)
MCH: 29.7 pg (ref 26.0–34.0)
MCHC: 31.4 g/dL (ref 30.0–36.0)
MCV: 94.4 fL (ref 80.0–100.0)
Monocytes Absolute: 0.5 10*3/uL (ref 0.1–1.0)
Monocytes Relative: 9 %
Neutro Abs: 5.3 10*3/uL (ref 1.7–7.7)
Neutrophils Relative %: 83 %
Platelets: 172 10*3/uL (ref 150–400)
RBC: 3.91 MIL/uL — ABNORMAL LOW (ref 4.22–5.81)
RDW: 14.7 % (ref 11.5–15.5)
WBC: 6.3 10*3/uL (ref 4.0–10.5)
nRBC: 0 % (ref 0.0–0.2)

## 2020-05-24 MED ORDER — DOCUSATE SODIUM 100 MG PO CAPS: 100.0000 mg | ORAL_CAPSULE | Freq: Two times a day (BID) | ORAL | 0 refills | Status: DC

## 2020-05-24 MED ORDER — POTASSIUM CHLORIDE CRYS ER 20 MEQ PO TBCR
40.0000 meq | EXTENDED_RELEASE_TABLET | Freq: Once | ORAL | Status: DC
  Administered 2020-05-24: 40 meq via ORAL

## 2020-05-24 MED ORDER — POTASSIUM CHLORIDE ER 10 MEQ PO TBCR: 10.0000 meq | EXTENDED_RELEASE_TABLET | Freq: Every day | ORAL | 2 refills | Status: DC

## 2020-05-24 MED ORDER — ACETAMINOPHEN 325 MG PO TABS: 650.0000 mg | ORAL_TABLET | Freq: Three times a day (TID) | ORAL | Status: DC

## 2020-05-24 MED ORDER — FUROSEMIDE 40 MG PO TABS: 40.0000 mg | ORAL_TABLET | Freq: Every day | ORAL | 2 refills | Status: DC

## 2020-05-24 MED ORDER — SENNA 8.6 MG PO TABS: 1.0000 | ORAL_TABLET | Freq: Two times a day (BID) | ORAL | 0 refills | Status: DC

## 2020-05-24 NOTE — Discharge Summary (Signed)
Physician Discharge Summary  Bentley Fissel WSF:[id number] DOB: 08-25-1935 DOA: 05/18/2020  PCP: Carol Ada, MD  Admit date: 05/18/2020 Discharge date: 05/24/2020  Admitted From: Home Disposition: Home  Recommendations for Outpatient Follow-up:  1. Follow up with PCP in 1-2 weeks 2. Please obtain CMP/CBC in one week 3. Please follow up with Ortho  Home Health: Yes  equipment/Devices: 3 and 1 bedside commode and walker Discharge Condition: Stable CODE STATUS: Full code Diet recommendation: Cardiac diet Brief/Interim Summary:85 year old male with history of A. fib on Eliquis now on hold for hip surgery, stage IIIb CKD, dilated cardiomyopathy, essential hypertension, Parkinson's disease, prostate cancer admitted status post ground-level fall and left hip fracture.   Discharge Diagnoses:  Active Problems:   CKD (chronic kidney disease) stage 3, GFR 30-59 ml/min (HCC)   Atrial fibrillation (HCC)   Pulmonary hypertension (HCC)   Essential hypertension   Closed left hip fracture (HCC)   Intertrochanteric fracture (HCC)   Hypokalemia   Dilated cardiomyopathy (HCC)   CKD (chronic kidney disease), stage III (HCC)   Parkinson's disease (HCC)   Closed fracture of left femur (HCC)   Pain   Elevated hemidiaphragm   #1 left hip fracture status post mechanical fall no loss of consciousness.  Status post left intramedullary fixation of the left femur 05/20/2020.  He was seen by physical therapy and recommended to discharge to SNF for rehabilitation.  However patient and significant other did not want to go to SNF.  Patient wanted to be discharged to home.  He is weightbearing as tolerated with a walker.  #2 permanent atrial fibrillation continue Eliquis and Cardizem.  #3 history of dilated cardiomyopathy with pulmonary hypertension -continue Lasix and potassium replacement on discharge.    #4 Parkinson's disease on Sinemet  #5 hypokalemia  potassium 3.4 repleted prior to  discharge.  #6 CKD stage IIIb-his baseline appears to be around 1.6.  His creatinine today is 1.16 on discharge.  Nutrition Problem: Increased nutrient needs Etiology: hip fracture,post-op healing    Signs/Symptoms: estimated needs     Interventions: Ensure Surgery,MVI  Estimated body mass index is 31.13 kg/m as calculated from the following:   Height as of this encounter: 5\' 10"  (1.778 m).   Weight as of this encounter: 98.4 kg.  Discharge Instructions  Discharge Instructions    Diet - low sodium heart healthy   Complete by: As directed    Face-to-face encounter (required for Medicare/Medicaid patients)   Complete by: As directed    I Georgette Shell certify that this patient is under my care and that I, or a nurse practitioner or physician's assistant working with me, had a face-to-face encounter that meets the physician face-to-face encounter requirements with this patient on 05/24/2020. The encounter with the patient was in whole, or in part for the following medical condition(s) which is the primary reason for home health care (List medical condition): hip fracture   The encounter with the patient was in whole, or in part, for the following medical condition, which is the primary reason for home health care: hip fracture   I certify that, based on my findings, the following services are medically necessary home health services: Physical therapy   Reason for Medically Necessary Home Health Services: Therapy- Personnel officer, Public librarian   My clinical findings support the need for the above services: Unsafe ambulation due to balance issues   Further, I certify that my clinical findings support that this patient is homebound due to: Unable  to leave home safely without assistance   Home Health   Complete by: As directed    To provide the following care/treatments: PT   Increase activity slowly   Complete by: As directed    Leave dressing on - Keep it  clean, dry, and intact until clinic visit   Complete by: As directed      Allergies as of 05/24/2020      Reactions   Nsaids Other (See Comments)   Cannot have any of these because he is currently taking Eliquis      Medication List    STOP taking these medications   metolazone 2.5 MG tablet Commonly known as: ZAROXOLYN   potassium chloride SA 20 MEQ tablet Commonly known as: KLOR-CON     TAKE these medications   acetaminophen 325 MG tablet Commonly known as: TYLENOL Take 1-2 tablets (325-650 mg total) by mouth every 6 (six) hours as needed for mild pain (pain score 1-3 or temp > 100.5).   acetaminophen 325 MG tablet Commonly known as: TYLENOL Take 2 tablets (650 mg total) by mouth 3 (three) times daily.   apixaban 2.5 MG Tabs tablet Commonly known as: Eliquis Take 1 tablet (2.5 mg total) by mouth 2 (two) times daily. (0800 & 2000)   carbidopa-levodopa 25-100 MG tablet Commonly known as: SINEMET IR TAKE 2 TABLETS BY MOUTH 3 (THREE) TIMES DAILY.   dapagliflozin propanediol 10 MG Tabs tablet Commonly known as: FARXIGA Take 1 tablet by mouth daily.   diltiazem 120 MG 24 hr capsule Commonly known as: CARDIZEM CD Take 1 capsule (120 mg total) by mouth daily.   docusate sodium 100 MG capsule Commonly known as: COLACE Take 1 capsule (100 mg total) by mouth 2 (two) times daily.   furosemide 40 MG tablet Commonly known as: Lasix Take 1 tablet (40 mg total) by mouth daily. What changed:   medication strength  how much to take  when to take this  additional instructions   gabapentin 300 MG capsule Commonly known as: NEURONTIN Take 1 capsule (300 mg total) by mouth 3 (three) times daily.   nitroGLYCERIN 0.4 MG SL tablet Commonly known as: NITROSTAT TAKE 1 TABLET EVERY 5 MINUTES AS NEEDED FOR CHEST PAIN   polyethylene glycol 17 g packet Commonly known as: MIRALAX / GLYCOLAX Take 17 g by mouth 2 (two) times daily. What changed:   when to take  this  reasons to take this   potassium chloride 10 MEQ tablet Commonly known as: KLOR-CON Take 1 tablet (10 mEq total) by mouth daily.   PRESERVISION AREDS 2 PO Take 1 tablet by mouth 2 (two) times daily. (0800 & 2000)   rosuvastatin 10 MG tablet Commonly known as: CRESTOR Take 10 mg by mouth daily at 8 pm. (2000)   senna 8.6 MG Tabs tablet Commonly known as: SENOKOT Take 1 tablet (8.6 mg total) by mouth 2 (two) times daily.   Vitamin D-3 25 MCG (1000 UT) Caps Take 1,000 Units by mouth daily. (0800)            Durable Medical Equipment  (From admission, onward)         Start     Ordered   05/24/20 0854  DME 3-in-1  Once        05/24/20 0853   05/24/20 0853  DME Walker  Once       Question Answer Comment  Walker: With 5 Inch Wheels   Patient needs a walker to treat with the  following condition Unsteady gait      05/24/20 0853   05/22/20 1205  For home use only DME Walker rolling  Once       Question Answer Comment  Walker: With [place]   Patient needs a walker to treat with the following condition Weakness      05/22/20 1204   05/22/20 1204  For home use only DME 3 n 1  Once        05/22/20 1204           Discharge Care Instructions  (From admission, onward)         Start     Ordered   05/24/20 0000  Leave dressing on - Keep it clean, dry, and intact until clinic visit        05/24/20 1497          Follow-up Information    Swinteck, Aaron Edelman, MD. Schedule an appointment as soon as possible for a visit in 2 weeks.   Specialty: Orthopedic Surgery Why: For wound re-check, For suture removal Contact information: [address] [place] [address] [phone]        [facility] Follow up.   Specialty: Home Health Services Why: For home physical therapy Contact information: [address] [place] [address] [phone]              Allergies  Allergen Reactions  . Nsaids Other (See  Comments)    Cannot have any of these because he is currently taking Eliquis    Consultations: ortho  Procedures/Studies: Pelvis Portable  Result Date: 05/20/2020 CLINICAL DATA:  Status post left femoral IM nail. EXAM: PORTABLE PELVIS 1-2 VIEWS COMPARISON:  05/18/2020. FINDINGS: Surgical clips noted in the region of the prostate bed. ORIF left femur. Hardware intact. Anatomic alignment. IMPRESSION: ORIF left femur. Hardware intact. Anatomic alignment. Electronically Signed   By: Marcello Moores  Register   On: 05/20/2020 16:38   DG CHEST PORT 1 VIEW  Result Date: 05/19/2020 CLINICAL DATA:  Dilated cardiomyopathy EXAM: PORTABLE CHEST 1 VIEW COMPARISON:  February 20 ,2020 FINDINGS: The heart size and mediastinal contours are moderately enlarged. Aortic knob calcifications are seen. There is elevation of the right hemidiaphragm. Prominence of the central pulmonary vasculature seen. No pleural effusion is noted. No acute osseous abnormality. IMPRESSION: Cardiomegaly and pulmonary vascular congestion. Electronically Signed   By: Prudencio Pair M.D.   On: 05/19/2020 19:04   DG C-Arm 1-60 Min-No Report  Result Date: 05/20/2020 Fluoroscopy was utilized by the requesting physician.  No radiographic interpretation.   DG HIP OPERATIVE UNILAT WITH PELVIS LEFT  Result Date: 05/20/2020 CLINICAL DATA:  Intramedullary nailing for fracture EXAM: OPERATIVE LEFT HIP  2 VIEWS TECHNIQUE: Fluoroscopic spot image(s) were submitted for interpretation post-operatively. COMPARISON:  May 18, 2020 FINDINGS: Frontal and lateral views were obtained. There is screw and nail fixation through intertrochanteric femur fracture on the left with alignment at the fracture site near anatomic. Screw tip in proximal femoral head. No new fracture. No dislocation. There are seed implants in the prostate. IMPRESSION: Screw and nail fixation through intertrochanteric femur fracture on the left with alignment fracture site near anatomic. No new  fracture. No dislocation. Screw tip in proximal femoral head. Electronically Signed   By: Lowella Grip III M.D.   On: 05/20/2020 15:21   DG Hip Unilat With Pelvis 2-3 Views Left  Result Date: 05/18/2020 CLINICAL DATA:  LEFT side pain post fall EXAM: DG HIP (  WITH OR WITHOUT PELVIS) 2-3V LEFT COMPARISON:  None FINDINGS: Osseous demineralization. Hip and SI joint spaces preserved. Mildly displaced intertrochanteric fracture LEFT femur. No dislocation. Pelvis intact. Brachytherapy seed implants at prostate gland. IMPRESSION: Mildly displaced intertrochanteric fracture LEFT femur. Electronically Signed   By: Lavonia Dana M.D.   On: 05/18/2020 21:54   DG FEMUR MIN 2 VIEWS LEFT  Result Date: 05/18/2020 CLINICAL DATA:  LEFT hip pain EXAM: LEFT FEMUR 2 VIEWS COMPARISON:  LEFT hip radiographs 05/18/2020 FINDINGS: Osseous demineralization. Joint space narrowing LEFT knee. Minimally displaced intertrochanteric fracture LEFT femur. No additional fracture or dislocation identified. Brachytherapy seed implants at prostate bed. Mild soft tissue swelling laterally at the LEFT hip region. IMPRESSION: Minimally displaced intertrochanteric fracture LEFT femur. Electronically Signed   By: Lavonia Dana M.D.   On: 05/18/2020 21:55    (Echo, Carotid, EGD, Colonoscopy, ERCP)    Subjective:  Patient is resting in bed awake and alert anxious to go home.  He gets very anxious every evening. Discharge Exam: Vitals:   05/23/20 2106 05/24/20 0407  BP: (!) 149/91 (!) 159/86  Pulse: 87 85  Resp: 18 18  Temp: 98.3 F (36.8 C) 97.8 F (36.6 C)  SpO2: 95% 95%   Vitals:   05/23/20 0104 05/23/20 1504 05/23/20 2106 05/24/20 0407  BP: (!) 145/67 (!) 143/74 (!) 149/91 (!) 159/86  Pulse: 74 65 87 85  Resp: 18 17 18 18   Temp: 97.8 F (36.6 C) 98.2 F (36.8 C) 98.3 F (36.8 C) 97.8 F (36.6 C)  TempSrc: Oral Oral Oral Oral  SpO2: 90% 98% 95% 95%  Weight:      Height:        General: Pt is alert, awake, not in  acute distress Cardiovascular: RRR, S1/S2 +, no rubs, no gallops Respiratory: CTA bilaterally, no wheezing, no rhonchi Abdominal: Soft, NT, ND, bowel sounds + Extremities: no edema, no cyanosis    The results of significant diagnostics from this hospitalization (including imaging, microbiology, ancillary and laboratory) are listed below for reference.     Microbiology: Recent Results (from the past 240 hour(s))  Resp Panel by RT-PCR (Flu A&B, Covid) Nasopharyngeal Swab     Status: None   Collection Time: 05/18/20 10:49 PM   Specimen: Nasopharyngeal Swab; Nasopharyngeal(NP) swabs in vial transport medium  Result Value Ref Range Status   SARS Coronavirus 2 by RT PCR NEGATIVE NEGATIVE Final    Comment: (NOTE) SARS-CoV-2 target nucleic acids are NOT DETECTED.  The SARS-CoV-2 RNA is generally detectable in upper respiratory specimens during the acute phase of infection. The lowest concentration of SARS-CoV-2 viral copies this assay can detect is 138 copies/mL. A negative result does not preclude SARS-Cov-2 infection and should not be used as the sole basis for treatment or other patient management decisions. A negative result may occur with  improper specimen collection/handling, submission of specimen other than nasopharyngeal swab, presence of viral mutation(s) within the areas targeted by this assay, and inadequate number of viral copies(<138 copies/mL). A negative result must be combined with clinical observations, patient history, and epidemiological information. The expected result is Negative.  Fact Sheet for Patients:  EntrepreneurPulse.com.au  Fact Sheet for Healthcare Providers:  IncredibleEmployment.be  This test is no t yet approved or cleared by the [place] FDA and  has been authorized for detection and/or diagnosis of SARS-CoV-2 by FDA under an Emergency Use Authorization (EUA). This EUA will remain  in effect (meaning this  test can be used) for the duration of  the COVID-19 declaration under Section 564(b)(1) of the Act, 21 U.S.C.section 360bbb-3(b)(1), unless the authorization is terminated  or revoked sooner.       Influenza A by PCR NEGATIVE NEGATIVE Final   Influenza B by PCR NEGATIVE NEGATIVE Final    Comment: (NOTE) The Xpert Xpress SARS-CoV-2/FLU/RSV plus assay is intended as an aid in the diagnosis of influenza from Nasopharyngeal swab specimens and should not be used as a sole basis for treatment. Nasal washings and aspirates are unacceptable for Xpert Xpress SARS-CoV-2/FLU/RSV testing.  Fact Sheet for Patients: EntrepreneurPulse.com.au  Fact Sheet for Healthcare Providers: IncredibleEmployment.be  This test is not yet approved or cleared by the [place] FDA and has been authorized for detection and/or diagnosis of SARS-CoV-2 by FDA under an Emergency Use Authorization (EUA). This EUA will remain in effect (meaning this test can be used) for the duration of the COVID-19 declaration under Section 564(b)(1) of the Act, 21 U.S.C. section 360bbb-3(b)(1), unless the authorization is terminated or revoked.  Performed at [facility]   Surgical PCR screen     Status: None   Collection Time: 05/20/20  3:14 AM   Specimen: Nasal Mucosa; Nasal Swab  Result Value Ref Range Status   MRSA, PCR NEGATIVE NEGATIVE Final   Staphylococcus aureus NEGATIVE NEGATIVE Final    Comment: (NOTE) The Xpert SA Assay (FDA approved for NASAL specimens in patients 36 years of age and older), is one component of a comprehensive surveillance program. It is not intended to diagnose infection nor to guide or monitor treatment. Performed at [facility], [place] [address] [place], [place] [address]      Labs: BNP (last 3 results) No results for input(s): BNP in the last 8760 hours. Basic Metabolic Panel: Recent Labs  Lab 05/19/20 0624  05/19/20 1817 05/20/20 0211 05/21/20 0536 05/22/20 0544 05/23/20 1047  NA 136  --  137 139 137 140  K 3.1* 3.4* 3.1* 3.7 3.3* 3.2*  CL 89*  --  93* 97* 97* 100  CO2 32  --  31 29 30 29   GLUCOSE 127*  --  116* 126* 107* 116*  BUN 40*  --  36* 31* 41* 35*  CREATININE 1.84*  --  1.83* 1.60* 1.63* 1.23  CALCIUM 9.0  --  8.8* 8.8* 8.7* 9.3  MG 2.6* 2.6* 2.5*  --   --   --   PHOS 5.0*  --  3.6  --   --   --    Liver Function Tests: Recent Labs  Lab 05/19/20 0624 05/20/20 0211 05/21/20 0536 05/22/20 0544 05/23/20 1047  AST 36 24 22 16 24   ALT 9 <5 5 5 9   ALKPHOS 128* 116 99 89 107  BILITOT 3.0* 4.4* 3.0* 2.4* 4.0*  PROT 7.4 6.8 6.6 6.2* 7.2  ALBUMIN 3.9 3.6 3.3* 3.1* 3.6   No results for input(s): LIPASE, AMYLASE in the last 168 hours. No results for input(s): AMMONIA in the last 168 hours. CBC: Recent Labs  Lab 05/18/20 2049 05/19/20 0624 05/20/20 0211 05/21/20 0536 05/22/20 0544 05/23/20 1047  WBC 5.7 8.1 6.6 8.1 7.2 6.8  NEUTROABS 4.1  --  5.2 6.8 5.6 5.7  HGB 15.8 14.9 13.4 12.3* 10.8* 12.0*  HCT 48.8 46.0 41.4 39.8 34.2* 37.2*  MCV 90.5 91.6 92.4 95.2 94.5 93.2  PLT 169 148* 139* 132* 130* 156   Cardiac Enzymes: No results for input(s): CKTOTAL, CKMB, CKMBINDEX, TROPONINI in the last 168 hours. BNP: Invalid input(s): POCBNP  CBG: No results for input(s): GLUCAP in the last 168 hours. D-Dimer No results for input(s): DDIMER in the last 72 hours. Hgb A1c No results for input(s): HGBA1C in the last 72 hours. Lipid Profile No results for input(s): CHOL, HDL, LDLCALC, TRIG, CHOLHDL, LDLDIRECT in the last 72 hours. Thyroid function studies No results for input(s): TSH, T4TOTAL, T3FREE, THYROIDAB in the last 72 hours.  Invalid input(s): FREET3 Anemia work up No results for input(s): VITAMINB12, FOLATE, FERRITIN, TIBC, IRON, RETICCTPCT in the last 72 hours. Urinalysis    Component Value Date/Time   COLORURINE YELLOW 05/23/2020 1453   APPEARANCEUR CLEAR  05/23/2020 1453   LABSPEC 1.016 05/23/2020 1453   PHURINE 5.0 05/23/2020 1453   GLUCOSEU 50 (A) 05/23/2020 1453   HGBUR NEGATIVE 05/23/2020 1453   BILIRUBINUR NEGATIVE 05/23/2020 1453   KETONESUR 5 (A) 05/23/2020 1453   PROTEINUR 100 (A) 05/23/2020 1453   NITRITE NEGATIVE 05/23/2020 1453   LEUKOCYTESUR NEGATIVE 05/23/2020 1453   Sepsis Labs Invalid input(s): PROCALCITONIN,  WBC,  LACTICIDVEN Microbiology Recent Results (from the past 240 hour(s))  Resp Panel by RT-PCR (Flu A&B, Covid) Nasopharyngeal Swab     Status: None   Collection Time: 05/18/20 10:49 PM   Specimen: Nasopharyngeal Swab; Nasopharyngeal(NP) swabs in vial transport medium  Result Value Ref Range Status   SARS Coronavirus 2 by RT PCR NEGATIVE NEGATIVE Final    Comment: (NOTE) SARS-CoV-2 target nucleic acids are NOT DETECTED.  The SARS-CoV-2 RNA is generally detectable in upper respiratory specimens during the acute phase of infection. The lowest concentration of SARS-CoV-2 viral copies this assay can detect is 138 copies/mL. A negative result does not preclude SARS-Cov-2 infection and should not be used as the sole basis for treatment or other patient management decisions. A negative result may occur with  improper specimen collection/handling, submission of specimen other than nasopharyngeal swab, presence of viral mutation(s) within the areas targeted by this assay, and inadequate number of viral copies(<138 copies/mL). A negative result must be combined with clinical observations, patient history, and epidemiological information. The expected result is Negative.  Fact Sheet for Patients:  EntrepreneurPulse.com.au  Fact Sheet for Healthcare Providers:  IncredibleEmployment.be  This test is no t yet approved or cleared by the [place] FDA and  has been authorized for detection and/or diagnosis of SARS-CoV-2 by FDA under an Emergency Use Authorization (EUA). This EUA  will remain  in effect (meaning this test can be used) for the duration of the COVID-19 declaration under Section 564(b)(1) of the Act, 21 U.S.C.section 360bbb-3(b)(1), unless the authorization is terminated  or revoked sooner.       Influenza A by PCR NEGATIVE NEGATIVE Final   Influenza B by PCR NEGATIVE NEGATIVE Final    Comment: (NOTE) The Xpert Xpress SARS-CoV-2/FLU/RSV plus assay is intended as an aid in the diagnosis of influenza from Nasopharyngeal swab specimens and should not be used as a sole basis for treatment. Nasal washings and aspirates are unacceptable for Xpert Xpress SARS-CoV-2/FLU/RSV testing.  Fact Sheet for Patients: EntrepreneurPulse.com.au  Fact Sheet for Healthcare Providers: IncredibleEmployment.be  This test is not yet approved or cleared by the [place] FDA and has been authorized for detection and/or diagnosis of SARS-CoV-2 by FDA under an Emergency Use Authorization (EUA). This EUA will remain in effect (meaning this test can be used) for the duration of the COVID-19 declaration under Section 564(b)(1) of the Act, 21 U.S.C. section 360bbb-3(b)(1), unless the authorization is terminated or revoked.  Performed at [facility]   Surgical PCR screen     Status: None   Collection Time: 05/20/20  3:14 AM   Specimen: Nasal Mucosa; Nasal Swab  Result Value Ref Range Status   MRSA, PCR NEGATIVE NEGATIVE Final   Staphylococcus aureus NEGATIVE NEGATIVE Final    Comment: (NOTE) The Xpert SA Assay (FDA approved for NASAL specimens in patients 11 years of age and older), is one component of a comprehensive surveillance program. It is not intended to diagnose infection nor to guide or monitor treatment. Performed at [facility], [place] [address] [place], [place] [address]      Time coordinating discharge: 39 minutes  SIGNED:   Georgette Shell, MD  [facility] 05/24/2020, 8:54 AM

## 2020-05-24 NOTE — Progress Notes (Incomplete)
Patient discharged to home with family, discharge instructions reviewed with patient and partner who verbalized understanding.

## 2020-05-24 NOTE — TOC Progression Note (Signed)
Transition of Care [facility]) - Progression Note    Patient Details  Name: [name] MRN: [id number] Date of Birth: [birth year]  Transition of Care [facility]) CM/SW Contact  Joaquin Courts, RN Phone Number: 05/24/2020, 12:05 PM  Clinical Narrative:    [facility] transport arranged.   Expected Discharge Plan: [place] Barriers to Discharge: No Barriers Identified  Expected Discharge Plan and Services Expected Discharge Plan: [place]   Discharge Planning Services: CM Consult Post Acute Care Choice: [place] arrangements for the past 2 months: Single Family Home Expected Discharge Date: 05/24/20               DME Arranged: 3-N-1,Walker rolling DME Agency: Other - Comment [place] [facility]) Date DME Agency Contacted: 05/22/20 Time DME Agency Contacted: 35 Representative spoke with at DME Agency: [facility]on [facility] HH Arranged: PT [place]: [place] Date [place]: 05/22/20 Time [place]: 2244 Representative spoke with at [place]: [place] ([place]) Interventions    Readmission Risk Interventions Readmission Risk Prevention Plan 05/22/2020  Transportation Screening Complete  PCP or Specialist Appt within 5-7 Days Complete  Home Care Screening Complete  Medication Review (RN CM) Complete  Some recent data might be hidden

## 2020-05-25 ENCOUNTER — Ambulatory Visit: Payer: Medicare Other | Admitting: Neurology

## 2020-05-26 DIAGNOSIS — Z7901 Long term (current) use of anticoagulants: Secondary | ICD-10-CM | POA: Diagnosis not present

## 2020-05-26 DIAGNOSIS — I4821 Permanent atrial fibrillation: Secondary | ICD-10-CM | POA: Diagnosis not present

## 2020-05-26 DIAGNOSIS — Z8546 Personal history of malignant neoplasm of prostate: Secondary | ICD-10-CM | POA: Diagnosis not present

## 2020-05-26 DIAGNOSIS — I1310 Hypertensive heart and chronic kidney disease without heart failure, with stage 1 through stage 4 chronic kidney disease, or unspecified chronic kidney disease: Secondary | ICD-10-CM | POA: Diagnosis not present

## 2020-05-26 DIAGNOSIS — S72002D Fracture of unspecified part of neck of left femur, subsequent encounter for closed fracture with routine healing: Secondary | ICD-10-CM | POA: Diagnosis not present

## 2020-05-26 DIAGNOSIS — Z9181 History of falling: Secondary | ICD-10-CM | POA: Diagnosis not present

## 2020-05-26 DIAGNOSIS — N1831 Chronic kidney disease, stage 3a: Secondary | ICD-10-CM | POA: Diagnosis not present

## 2020-05-26 DIAGNOSIS — S72142D Displaced intertrochanteric fracture of left femur, subsequent encounter for closed fracture with routine healing: Secondary | ICD-10-CM | POA: Diagnosis not present

## 2020-05-26 DIAGNOSIS — I272 Pulmonary hypertension, unspecified: Secondary | ICD-10-CM | POA: Diagnosis not present

## 2020-05-26 DIAGNOSIS — G20 Parkinson's disease: Secondary | ICD-10-CM | POA: Diagnosis not present

## 2020-05-26 DIAGNOSIS — I2510 Atherosclerotic heart disease of native coronary artery without angina pectoris: Secondary | ICD-10-CM | POA: Diagnosis not present

## 2020-05-26 DIAGNOSIS — I420 Dilated cardiomyopathy: Secondary | ICD-10-CM | POA: Diagnosis not present

## 2020-05-26 DIAGNOSIS — E785 Hyperlipidemia, unspecified: Secondary | ICD-10-CM | POA: Diagnosis not present

## 2020-05-27 DIAGNOSIS — N1831 Chronic kidney disease, stage 3a: Secondary | ICD-10-CM | POA: Diagnosis not present

## 2020-05-27 DIAGNOSIS — I4821 Permanent atrial fibrillation: Secondary | ICD-10-CM | POA: Diagnosis not present

## 2020-05-27 DIAGNOSIS — S72002D Fracture of unspecified part of neck of left femur, subsequent encounter for closed fracture with routine healing: Secondary | ICD-10-CM | POA: Diagnosis not present

## 2020-05-27 DIAGNOSIS — I1310 Hypertensive heart and chronic kidney disease without heart failure, with stage 1 through stage 4 chronic kidney disease, or unspecified chronic kidney disease: Secondary | ICD-10-CM | POA: Diagnosis not present

## 2020-05-27 DIAGNOSIS — I2510 Atherosclerotic heart disease of native coronary artery without angina pectoris: Secondary | ICD-10-CM | POA: Diagnosis not present

## 2020-05-27 DIAGNOSIS — S72142D Displaced intertrochanteric fracture of left femur, subsequent encounter for closed fracture with routine healing: Secondary | ICD-10-CM | POA: Diagnosis not present

## 2020-05-29 ENCOUNTER — Telehealth: Payer: Self-pay

## 2020-05-29 ENCOUNTER — Other Ambulatory Visit: Payer: Self-pay | Admitting: Cardiology

## 2020-05-29 DIAGNOSIS — I10 Essential (primary) hypertension: Secondary | ICD-10-CM

## 2020-05-29 DIAGNOSIS — I2510 Atherosclerotic heart disease of native coronary artery without angina pectoris: Secondary | ICD-10-CM | POA: Diagnosis not present

## 2020-05-29 DIAGNOSIS — S72002D Fracture of unspecified part of neck of left femur, subsequent encounter for closed fracture with routine healing: Secondary | ICD-10-CM | POA: Diagnosis not present

## 2020-05-29 DIAGNOSIS — I5032 Chronic diastolic (congestive) heart failure: Secondary | ICD-10-CM

## 2020-05-29 DIAGNOSIS — N1831 Chronic kidney disease, stage 3a: Secondary | ICD-10-CM | POA: Diagnosis not present

## 2020-05-29 DIAGNOSIS — I4821 Permanent atrial fibrillation: Secondary | ICD-10-CM | POA: Diagnosis not present

## 2020-05-29 DIAGNOSIS — R600 Localized edema: Secondary | ICD-10-CM

## 2020-05-29 DIAGNOSIS — I1310 Hypertensive heart and chronic kidney disease without heart failure, with stage 1 through stage 4 chronic kidney disease, or unspecified chronic kidney disease: Secondary | ICD-10-CM | POA: Diagnosis not present

## 2020-05-29 DIAGNOSIS — S72142D Displaced intertrochanteric fracture of left femur, subsequent encounter for closed fracture with routine healing: Secondary | ICD-10-CM | POA: Diagnosis not present

## 2020-05-29 MED ORDER — POTASSIUM CHLORIDE ER 10 MEQ PO TBCR: 20.0000 meq | EXTENDED_RELEASE_TABLET | Freq: Two times a day (BID) | ORAL | 0 refills | Status: DC

## 2020-05-29 MED ORDER — FUROSEMIDE 40 MG PO TABS: 40.0000 mg | ORAL_TABLET | Freq: Every day | ORAL | 0 refills | Status: DC

## 2020-05-29 NOTE — Telephone Encounter (Signed)
Reviewed recent hospitalization documentation.  Spoke with Ms. [name]  Patient has been having increasing leg swelling since reducing Lasix dose.  I recommend the following:  Increase Lasix to 80 mg twice daily, and potassium to 20 MDQ twice daily.  Check BMP on April 8.  Okay to increase gabapentin back to 300 3 times daily, but needs to follow-up with PCP re: this   [name], MD

## 2020-05-29 NOTE — Telephone Encounter (Signed)
Patient's significant other Vaughan Basta called regarding patient was recently in the hospital due to a fall and patient's medications were changed patient was taking Laxis 80mg  TID and now patient is suppose to take 40mg  daily pts Gabapentin was 300mg  TID was changed to BID Potassium was changed to daily instead of BID she isn't sure why his medication were changed they were also told to take Tylenol for pain as needed she recalls you telling them not to take Tylenol or to consult with you first before taking it. She also stated pts leg is very swollen wjhere he had the surgery. She asked to speak to you personally please advise   Best number to contact her, Tamsen Snider  [phone]

## 2020-06-01 ENCOUNTER — Telehealth: Payer: Self-pay | Admitting: Student

## 2020-06-01 DIAGNOSIS — N1831 Chronic kidney disease, stage 3a: Secondary | ICD-10-CM | POA: Diagnosis not present

## 2020-06-01 DIAGNOSIS — I2510 Atherosclerotic heart disease of native coronary artery without angina pectoris: Secondary | ICD-10-CM | POA: Diagnosis not present

## 2020-06-01 DIAGNOSIS — I1310 Hypertensive heart and chronic kidney disease without heart failure, with stage 1 through stage 4 chronic kidney disease, or unspecified chronic kidney disease: Secondary | ICD-10-CM | POA: Diagnosis not present

## 2020-06-01 DIAGNOSIS — S72002D Fracture of unspecified part of neck of left femur, subsequent encounter for closed fracture with routine healing: Secondary | ICD-10-CM | POA: Diagnosis not present

## 2020-06-01 DIAGNOSIS — S72142D Displaced intertrochanteric fracture of left femur, subsequent encounter for closed fracture with routine healing: Secondary | ICD-10-CM | POA: Diagnosis not present

## 2020-06-01 DIAGNOSIS — I4821 Permanent atrial fibrillation: Secondary | ICD-10-CM | POA: Diagnosis not present

## 2020-06-01 DIAGNOSIS — M7989 Other specified soft tissue disorders: Secondary | ICD-10-CM | POA: Diagnosis not present

## 2020-06-01 NOTE — Telephone Encounter (Signed)
ON-CALL CARDIOLOGY 06/01/20  Patient's name: [name]   MRN: [id number].    DOB: [birth year] Primary care provider: Carol Ada, MD. Primary cardiologist: Dr. Jorge Ny regarding this patient's care today: Patient's wife called reporting the patient has had unilateral left leg swelling and pain for the last 2 days.  He was seen by PT today who was concerned regarding DVT.  Patient underwent intramedullary femoral nail on the left side following a fall on 05/20/2020.  Patient's wife states she called the surgeon's office, however she has not received a call back and therefore reached out to our office.   Patient denies chest pain and dyspnea.  Impression:   ICD-10-CM   1. Left leg swelling  M79.89 VAS [place] LOWER EXTREMITY VENOUS (DVT)    No orders of the defined types were placed in this encounter.   Orders Placed This Encounter  Procedures  . VAS [place] LOWER EXTREMITY VENOUS (DVT)    Recommendations: Recommend DVT study in view of unilateral leg swelling and recent surgery. Also recommend patient follow up with surgeon's office. Counseled patient and his wife regarding signs/symptoms that would warrant urgent/emergent evaluation, they verbalized understanding and agreement.   Telephone encounter total time: [place], PA-C 06/01/2020, 5:57 PM Office: [phone]

## 2020-06-01 NOTE — Telephone Encounter (Signed)
Attempted to return patient call to on-call service. Left a voicemail.

## 2020-06-04 DIAGNOSIS — N1831 Chronic kidney disease, stage 3a: Secondary | ICD-10-CM | POA: Diagnosis not present

## 2020-06-04 DIAGNOSIS — I4821 Permanent atrial fibrillation: Secondary | ICD-10-CM | POA: Diagnosis not present

## 2020-06-04 DIAGNOSIS — I1310 Hypertensive heart and chronic kidney disease without heart failure, with stage 1 through stage 4 chronic kidney disease, or unspecified chronic kidney disease: Secondary | ICD-10-CM | POA: Diagnosis not present

## 2020-06-04 DIAGNOSIS — S72142D Displaced intertrochanteric fracture of left femur, subsequent encounter for closed fracture with routine healing: Secondary | ICD-10-CM | POA: Diagnosis not present

## 2020-06-04 DIAGNOSIS — I2510 Atherosclerotic heart disease of native coronary artery without angina pectoris: Secondary | ICD-10-CM | POA: Diagnosis not present

## 2020-06-04 DIAGNOSIS — S72002D Fracture of unspecified part of neck of left femur, subsequent encounter for closed fracture with routine healing: Secondary | ICD-10-CM | POA: Diagnosis not present

## 2020-06-05 ENCOUNTER — Encounter (HOSPITAL_COMMUNITY): Payer: Self-pay

## 2020-06-05 ENCOUNTER — Emergency Department (HOSPITAL_COMMUNITY)
Admission: EM | Admit: 2020-06-05 | Discharge: 2020-06-05 | Disposition: A | Discharge status: Home / Self Care | Payer: Medicare Other | Attending: Emergency Medicine | Admitting: Emergency Medicine

## 2020-06-05 ENCOUNTER — Emergency Department (HOSPITAL_BASED_OUTPATIENT_CLINIC_OR_DEPARTMENT_OTHER)
Admit: 2020-06-05 | Discharge: 2020-06-05 | Disposition: A | Discharge status: Home / Self Care | Payer: Medicare Other | Attending: Emergency Medicine | Admitting: Emergency Medicine

## 2020-06-05 ENCOUNTER — Other Ambulatory Visit: Payer: Self-pay

## 2020-06-05 DIAGNOSIS — M79604 Pain in right leg: Secondary | ICD-10-CM | POA: Diagnosis not present

## 2020-06-05 DIAGNOSIS — M79605 Pain in left leg: Secondary | ICD-10-CM | POA: Diagnosis not present

## 2020-06-05 DIAGNOSIS — R600 Localized edema: Secondary | ICD-10-CM | POA: Diagnosis not present

## 2020-06-05 DIAGNOSIS — M79662 Pain in left lower leg: Secondary | ICD-10-CM | POA: Diagnosis not present

## 2020-06-05 DIAGNOSIS — R609 Edema, unspecified: Secondary | ICD-10-CM | POA: Diagnosis not present

## 2020-06-05 DIAGNOSIS — I2510 Atherosclerotic heart disease of native coronary artery without angina pectoris: Secondary | ICD-10-CM | POA: Insufficient documentation

## 2020-06-05 DIAGNOSIS — L03116 Cellulitis of left lower limb: Secondary | ICD-10-CM | POA: Diagnosis not present

## 2020-06-05 DIAGNOSIS — Z7901 Long term (current) use of anticoagulants: Secondary | ICD-10-CM | POA: Diagnosis not present

## 2020-06-05 DIAGNOSIS — I10 Essential (primary) hypertension: Secondary | ICD-10-CM | POA: Diagnosis not present

## 2020-06-05 DIAGNOSIS — S72142D Displaced intertrochanteric fracture of left femur, subsequent encounter for closed fracture with routine healing: Secondary | ICD-10-CM | POA: Diagnosis not present

## 2020-06-05 DIAGNOSIS — M79661 Pain in right lower leg: Secondary | ICD-10-CM | POA: Diagnosis not present

## 2020-06-05 DIAGNOSIS — M7989 Other specified soft tissue disorders: Secondary | ICD-10-CM | POA: Diagnosis present

## 2020-06-05 MED ORDER — DOXYCYCLINE HYCLATE 100 MG PO CAPS: 100.0000 mg | ORAL_CAPSULE | Freq: Two times a day (BID) | ORAL | 0 refills | Status: DC

## 2020-06-05 NOTE — ED Triage Notes (Signed)
Patient went for a follow up for post femur fracture surgery today and noted that he had increased swelling to the left leg. Patient was sent to the ED to r/o a blood clot.

## 2020-06-05 NOTE — Progress Notes (Signed)
Left lower extremity venous duplex has been completed. Preliminary results can be found in CV Proc through chart review.  Results were given to [facility] PA.  06/05/20 6:38 PM Carlos Levering RVT

## 2020-06-05 NOTE — ED Provider Notes (Signed)
[facility] DEPT Provider Note   CSN: [id number] Arrival date & time: 06/05/20  1657     History Chief Complaint  Patient presents with  . Leg Swelling    [name] is a 85 y.o. male with a past medical history of CAD currently on Eliquis, hypertension, status post left intertrochanteric fracture repair on 05/20/2020 by Dr. Lyla Glassing presenting to the ED for left leg swelling.  He was evaluated in clinic today and was sent to the ED to rule out a blood clot.  He has had chronic bilateral lower extremity edema but this has worsened over the past 2 weeks since the surgery.  Denies history of DVT or PE in the past.  No chest pain, shortness of breath, fever or trauma.  HPI     Past Medical History:  Diagnosis Date  . Arthritis    "knees; lower back" (08/09/2016)  . Atrial fibrillation ([place])    remote hx/notes 08/07/2016  . Coronary artery disease   . Episodes of trembling    "most of the time it's my left hand" (08/09/2016)  . GI bleeding 08/2017  . High cholesterol   . History of kidney stones   . Hypertension   . Melanoma of shoulder ([place]) 2000s   "left"  . Mitral regurgitation and mitral stenosis    hx/notes 08/07/2016  . Moderate aortic stenosis    hx/notes 08/07/2016  . Parkinson's disease ([place])   . Prostate cancer ([place]) 2011   hx/notes 08/07/2016    Patient Active Problem List   Diagnosis Date Noted  . Elevated hemidiaphragm   . Closed fracture of left femur ([place])   . Pain   . Hypokalemia 05/19/2020  . Dilated cardiomyopathy ([place]) 05/19/2020  . CKD (chronic kidney disease), stage III ([place]) 05/19/2020  . Parkinson's disease ([place]) 05/19/2020  . Closed left hip fracture ([place]) 05/18/2020  . Intertrochanteric fracture ([place]) 05/18/2020  . Essential hypertension 07/21/2018  . Bilateral leg edema 07/21/2018  . Laboratory examination 05/14/2018  . Pulmonary hypertension ([place]) 03/16/2018  . Nonrheumatic mitral valve regurgitation  03/16/2018  . Tricuspid regurgitation 03/16/2018  . CKD (chronic kidney disease) stage 3, GFR 30-59 ml/min (HCC) 09/26/2017  . Chronic anticoagulation 09/26/2017  . Atrial fibrillation ([place]) 09/26/2017  . Atrial flutter ([place]) 02/11/2017  . Post PTCA 08/09/2016  . Dyspnea on exertion 08/07/2016  . Coronary artery disease involving native coronary artery of native heart without angina pectoris 08/07/2016    Past Surgical History:  Procedure Laterality Date  . CARDIAC CATHETERIZATION  ~ [address], [place]"  . CARDIOVERSION N/A 01/26/2017   Procedure: CARDIOVERSION;  Surgeon: [name], MD;  Location: [facility]COPY;  Service: Cardiovascular;  Laterality: N/A;  . CARDIOVERSION N/A 03/06/2018   Procedure: CARDIOVERSION;  Surgeon: [name], MD;  Location: [facility] ENDOSCOPY;  Service: Cardiovascular;  Laterality: N/A;  . CATARACT EXTRACTION W/ INTRAOCULAR LENS  IMPLANT, BILATERAL Bilateral 2014-02/2016   left-right; hx/notes 08/07/2016  . COLONOSCOPY WITH PROPOFOL N/A 09/25/2017   Procedure: COLONOSCOPY WITH PROPOFOL;  Surgeon: Laurence Spates, MD;  Location: [facility];  Service: Endoscopy;  Laterality: N/A;  . CORONARY ANGIOPLASTY WITH STENT PLACEMENT  08/09/2016  . CORONARY ATHERECTOMY N/A 08/09/2016   Procedure: Coronary Atherectomy;  Surgeon: Adrian Prows, MD;  Location: [facility] CV LAB;  Service: Cardiovascular;  Laterality: N/A;  . CORONARY STENT INTERVENTION N/A 08/09/2016   Procedure: Coronary Stent Intervention;  Surgeon: Adrian Prows, MD;  Location: [facility] CV LAB;  Service:  Cardiovascular;  Laterality: N/A;  LAD  . EYE SURGERY Bilateral 05/2016   "laser on both lenses"  . FEMUR IM NAIL Left 05/20/2020   Procedure: INTRAMEDULLARY (IM) NAIL FEMORAL;  Surgeon: Rod Can, MD;  Location: [facility];  Service: Orthopedics;  Laterality: Left;  . HEMORRHOID SURGERY N/A 09/27/2017   Procedure: HEMORRHOIDECTOMY;  Surgeon: Erroll Luna, MD;  Location: [facility];  Service:  General;  Laterality: N/A;  . INGUINAL HERNIA REPAIR Left 1991  . INSERTION PROSTATE RADIATION SEED  06/25/2009  . INTRAVASCULAR PRESSURE WIRE/FFR STUDY N/A 08/09/2016   Procedure: Intravascular Pressure Wire/FFR Study;  Surgeon: Adrian Prows, MD;  Location: [facility] CV LAB;  Service: Cardiovascular;  Laterality: N/A;  . LAPAROSCOPIC CHOLECYSTECTOMY  1993  . MELANOMA EXCISION Left 1990s   "shoulder"  . MELANOMA EXCISION Right 08/2018   right shoulder  . RIGHT HEART CATH N/A 03/20/2018   Procedure: RIGHT HEART CATH;  Surgeon: [name], MD;  Location: [facility] CV LAB;  Service: Cardiovascular;  Laterality: N/A;  . RIGHT/LEFT HEART CATH AND CORONARY ANGIOGRAPHY N/A 08/09/2016   Procedure: Right/Left Heart Cath and Coronary Angiography;  Surgeon: Adrian Prows, MD;  Location: [facility];  Service: Cardiovascular;  Laterality: N/A;  . TEE WITHOUT CARDIOVERSION N/A 01/25/2017   Procedure: TRANSESOPHAGEAL ECHOCARDIOGRAM (TEE);  Surgeon: [name], MD;  Location[facility];  Service: Cardiovascular;  Laterality: N/A;       Family History  Problem Relation Age of Onset  . Heart attack Father   . Cerebral aneurysm Sister   . Healthy Daughter     Social History   Tobacco Use  . Smoking status: Never Smoker  . Smokeless tobacco: Never Used  . Tobacco comment: "smoked  2 months when I was 20; non since" (08/09/2016)  Vaping Use  . Vaping Use: Never used  Substance Use Topics  . Alcohol use: Yes    Alcohol/week: 14.0 standard drinks    Types: 14 Shots of liquor per week    Comment: 2 drinks per day  . Drug use: No    Home Medications Prior to Admission medications   Medication Sig Start Date End Date Taking? Authorizing Provider  acetaminophen (TYLENOL) 325 MG tablet Take 1-2 tablets (325-650 mg total) by mouth every 6 (six) hours as needed for mild pain (pain score 1-3 or temp > 100.5). 05/21/20   Dorothyann Peng, PA  acetaminophen (TYLENOL) 325 MG  tablet Take 2 tablets (650 mg total) by mouth 3 (three) times daily. 05/24/20   Georgette Shell, MD  apixaban (ELIQUIS) 2.5 MG TABS tablet Take 1 tablet (2.5 mg total) by mouth 2 (two) times daily. (0800 & 2000) 04/03/20   Patwardhan, Manish J, MD  carbidopa-levodopa (SINEMET IR) 25-100 MG tablet TAKE 2 TABLETS BY MOUTH 3 (THREE) TIMES DAILY. 05/08/20   Tat, Eustace Quail, DO  Cholecalciferol (VITAMIN D-3) 1000 units CAPS Take 1,000 Units by mouth daily. (0800)    [provider]  dapagliflozin propanediol (FARXIGA) 10 MG TABS tablet Take 1 tablet by mouth daily.    [provider]  diltiazem (CARDIZEM CD) 120 MG 24 hr capsule Take 1 capsule (120 mg total) by mouth daily. 11/11/19 02/09/20  Patwardhan, Reynold Bowen, MD  docusate sodium (COLACE) 100 MG capsule Take 1 capsule (100 mg total) by mouth 2 (two) times daily. 05/24/20   Georgette Shell, MD  furosemide (LASIX) 40 MG tablet Take 1 tablet (40 mg total) by mouth daily. 05/29/20 05/29/21  Patwardhan, Manish J, MD  gabapentin (NEURONTIN) 300 MG capsule Take 1 capsule (300 mg total) by mouth 3 (three) times daily. 05/05/20   Tat, Eustace Quail, DO  Multiple Vitamins-Minerals (PRESERVISION AREDS 2 PO) Take 1 tablet by mouth 2 (two) times daily. (0800 & 2000)    [provider]  nitroGLYCERIN (NITROSTAT) 0.4 MG SL tablet TAKE 1 TABLET EVERY 5 MINUTES AS NEEDED FOR CHEST PAIN 11/14/18   Adrian Prows, MD  polyethylene glycol (MIRALAX / GLYCOLAX) packet Take 17 g by mouth 2 (two) times daily. Patient taking differently: Take 17 g by mouth as needed for mild constipation. 09/29/17   Hosie Poisson, MD  potassium chloride (KLOR-CON) 10 MEQ tablet Take 2 tablets (20 mEq total) by mouth 2 (two) times daily. 05/29/20   Patwardhan, Reynold Bowen, MD  rosuvastatin (CRESTOR) 10 MG tablet Take 10 mg by mouth daily at 8 pm. (2000)    [provider]  senna (SENOKOT) 8.6 MG TABS tablet Take 1 tablet (8.6 mg total) by mouth 2 (two) times daily. 05/24/20    Georgette Shell, MD    Allergies    Nsaids  Review of Systems   Review of Systems  Constitutional: Negative for appetite change, chills and fever.  HENT: Negative for ear pain, rhinorrhea, sneezing and sore throat.   Eyes: Negative for photophobia and visual disturbance.  Respiratory: Negative for cough, chest tightness, shortness of breath and wheezing.   Cardiovascular: Positive for leg swelling. Negative for chest pain and palpitations.  Gastrointestinal: Negative for abdominal pain, blood in stool, constipation, diarrhea, nausea and vomiting.  Genitourinary: Negative for dysuria, hematuria and urgency.  Musculoskeletal: Negative for myalgias.  Skin: Negative for rash.  Neurological: Negative for dizziness, weakness and light-headedness.    Physical Exam Updated Vital Signs BP (!) 147/77 (BP Location: Left Arm)   Pulse 93   Temp 98.3 F (36.8 C) (Oral)   Resp 16   Ht 5\' 10"  (1.778 m)   Wt 90.7 kg   SpO2 99%   BMI 28.70 kg/m   Physical Exam Vitals and nursing note reviewed.  Constitutional:      General: He is not in acute distress.    Appearance: He is well-developed.  HENT:     Head: Normocephalic and atraumatic.     Nose: Nose normal.  Eyes:     General: No scleral icterus.       Left eye: No discharge.     Conjunctiva/sclera: Conjunctivae normal.  Cardiovascular:     Rate and Rhythm: Normal rate and regular rhythm.     Heart sounds: Normal heart sounds. No murmur heard. No friction rub. No gallop.   Pulmonary:     Effort: Pulmonary effort is normal. No respiratory distress.     Breath sounds: Normal breath sounds.  Abdominal:     General: Bowel sounds are normal. There is no distension.     Palpations: Abdomen is soft.     Tenderness: There is no abdominal tenderness. There is no guarding.  Musculoskeletal:        General: Normal range of motion.     Cervical back: Normal range of motion and neck supple.     Right lower leg: Edema present.      Left lower leg: Edema present.     Comments: Pitting edema to bilateral lower extremities, left greater than right.  There is erythema and warmth of the left shin area.  This edema extends down to the bilateral feet.  Normal  sensation to light touch.  Normal range of motion of joints.  Skin:    General: Skin is warm and dry.     Findings: No rash.  Neurological:     Mental Status: He is alert.     Motor: No abnormal muscle tone.     Coordination: Coordination normal.       ED Results / Procedures / Treatments   Labs (all labs ordered are listed, but only abnormal results are displayed) Labs Reviewed - No data to display  EKG None  Radiology VAS [place] LOWER EXTREMITY VENOUS (DVT) (ONLY [facility] & [facility] 7a-7p)  Result Date: 06/05/2020  Lower Venous DVT Study Indications: Edema.  Risk Factors: Surgery Trauma. Limitations: Poor ultrasound/tissue interface. Comparison Study: No prior studies. Performing Technologist: Oliver Hum RVT  Examination Guidelines: A complete evaluation includes B-mode imaging, spectral Doppler, color Doppler, and power Doppler as needed of all accessible portions of each vessel. Bilateral testing is considered an integral part of a complete examination. Limited examinations for reoccurring indications may be performed as noted. The reflux portion of the exam is performed with the patient in reverse Trendelenburg.  +-----+---------------+---------+-----------+----------+--------------+ RIGHTCompressibilityPhasicitySpontaneityPropertiesThrombus Aging +-----+---------------+---------+-----------+----------+--------------+ CFV  Full           Yes      Yes                                 +-----+---------------+---------+-----------+----------+--------------+   +---------+---------------+---------+-----------+----------+-------------------+ LEFT     CompressibilityPhasicitySpontaneityPropertiesThrombus Aging       +---------+---------------+---------+-----------+----------+-------------------+ CFV      Full           Yes      Yes                                      +---------+---------------+---------+-----------+----------+-------------------+ SFJ      Full                                                             +---------+---------------+---------+-----------+----------+-------------------+ FV Prox  Full                                                             +---------+---------------+---------+-----------+----------+-------------------+ FV Mid   Full                                                             +---------+---------------+---------+-----------+----------+-------------------+ FV DistalFull                                                             +---------+---------------+---------+-----------+----------+-------------------+ PFV  Full                                                             +---------+---------------+---------+-----------+----------+-------------------+ POP      Full           Yes      Yes                                      +---------+---------------+---------+-----------+----------+-------------------+ PTV      Full                                                             +---------+---------------+---------+-----------+----------+-------------------+ PERO                                                  Patency shown with                                                        color doppler       +---------+---------------+---------+-----------+----------+-------------------+     Summary: RIGHT: - No evidence of common femoral vein obstruction.  LEFT: - There is no evidence of deep vein thrombosis in the lower extremity. However, portions of this examination were limited- see technologist comments above.  - No cystic structure found in the popliteal fossa.  *See table(s) above for measurements and  observations. Electronically signed by Deitra Mayo MD on 06/05/2020 at 6:39:03 PM.    Final     Procedures Procedures   Medications Ordered in ED Medications - No data to display  ED Course  I have reviewed the triage vital signs and the nursing notes.  Pertinent labs & imaging results that were available during my care of the patient were reviewed by me and considered in my medical decision making (see chart for details).    MDM Rules/Calculators/A&P                          85 year old male with a past medical history of CAD on Eliquis, hypertension, status post left intertrochanteric fracture repair about 2 weeks ago presenting to the ED for left lower extremity edema.  He has chronic bilateral lower extremity edema but the left side has worsened since his surgery.  Sent from orthopedic office to rule out DVT.  No history of DVT or PE in the past.  No chest pain, shortness of breath or trauma.  Physical exam findings noted above, bilateral lower extremity pitting edema that is worse than the left with associated erythema and warmth of the shin.  Question cellulitis if his DVT study is negative, he takes his Eliquis daily so there is a low chance that he has  a DVT but will need to consider as he is postop with unilateral swelling.  DVT study is negative.  He does not appear septic at this time, he is not febrile, tachycardic or hypotensive.  We will hold off on antibiotics until he follows up with PCP as this could be due to venous stasis.  I will have him follow-up with his PCP and orthopedist. Return precautions given.   Patient is hemodynamically stable, in NAD, and able to ambulate in the ED. Evaluation does not show pathology that would require ongoing emergent intervention or inpatient treatment. I explained the diagnosis to the patient. Pain has been managed and has no complaints prior to discharge. Patient is comfortable with above plan and is stable for discharge at this time.  All questions were answered prior to disposition. Strict return precautions for returning to the ED were discussed. Encouraged follow up with PCP.   An After Visit Summary was printed and given to the patient.   Portions of this note were generated with [employer]. Dictation errors may occur despite best attempts at proofreading.  Final Clinical Impression(s) / ED Diagnoses Final diagnoses:  Cellulitis of left lower extremity  Peripheral edema    Rx / DC Orders ED Discharge Orders         Ordered    doxycycline (VIBRAMYCIN) 100 MG capsule  2 times daily,   Status:  Discontinued        06/05/20 1849           Delia Heady, PA-C 06/05/20 1904    Carmin Muskrat, MD 06/05/20 2338

## 2020-06-05 NOTE — Discharge Instructions (Addendum)
Your ultrasound was negative for DVT of your left leg. Follow-up with your primary care provider and orthopedist. Return to the ER if you start to experience worsening swelling, fever, chest pain or shortness of breath.

## 2020-06-09 ENCOUNTER — Telehealth: Payer: Self-pay

## 2020-06-09 DIAGNOSIS — I1310 Hypertensive heart and chronic kidney disease without heart failure, with stage 1 through stage 4 chronic kidney disease, or unspecified chronic kidney disease: Secondary | ICD-10-CM | POA: Diagnosis not present

## 2020-06-09 DIAGNOSIS — I4821 Permanent atrial fibrillation: Secondary | ICD-10-CM | POA: Diagnosis not present

## 2020-06-09 DIAGNOSIS — N1831 Chronic kidney disease, stage 3a: Secondary | ICD-10-CM | POA: Diagnosis not present

## 2020-06-09 DIAGNOSIS — I2510 Atherosclerotic heart disease of native coronary artery without angina pectoris: Secondary | ICD-10-CM | POA: Diagnosis not present

## 2020-06-09 DIAGNOSIS — S72002D Fracture of unspecified part of neck of left femur, subsequent encounter for closed fracture with routine healing: Secondary | ICD-10-CM | POA: Diagnosis not present

## 2020-06-09 DIAGNOSIS — S72142D Displaced intertrochanteric fracture of left femur, subsequent encounter for closed fracture with routine healing: Secondary | ICD-10-CM | POA: Diagnosis not present

## 2020-06-09 NOTE — Telephone Encounter (Signed)
Ms Tamsen Snider called on behalf of patient that he is retaining fluids and has severe swelling. Patient had surgery back on 03/23 on his left leg and recently went to the ED for his swelling and they did a scan and found no blood clot but she states his swollen has not gotten better. Ms Melina Modena is wondering if she needs to give him an extra lasix and she also stated he was on metolazone but he was taken off it she is wondering if he needs to start that again please advise

## 2020-06-10 DIAGNOSIS — I10 Essential (primary) hypertension: Secondary | ICD-10-CM | POA: Diagnosis not present

## 2020-06-11 DIAGNOSIS — N1831 Chronic kidney disease, stage 3a: Secondary | ICD-10-CM | POA: Diagnosis not present

## 2020-06-11 DIAGNOSIS — S72002D Fracture of unspecified part of neck of left femur, subsequent encounter for closed fracture with routine healing: Secondary | ICD-10-CM | POA: Diagnosis not present

## 2020-06-11 DIAGNOSIS — S72142D Displaced intertrochanteric fracture of left femur, subsequent encounter for closed fracture with routine healing: Secondary | ICD-10-CM | POA: Diagnosis not present

## 2020-06-11 DIAGNOSIS — I4821 Permanent atrial fibrillation: Secondary | ICD-10-CM | POA: Diagnosis not present

## 2020-06-11 DIAGNOSIS — I1310 Hypertensive heart and chronic kidney disease without heart failure, with stage 1 through stage 4 chronic kidney disease, or unspecified chronic kidney disease: Secondary | ICD-10-CM | POA: Diagnosis not present

## 2020-06-11 DIAGNOSIS — I2510 Atherosclerotic heart disease of native coronary artery without angina pectoris: Secondary | ICD-10-CM | POA: Diagnosis not present

## 2020-06-11 LAB — BASIC METABOLIC PANEL
BUN/Creatinine Ratio: 11 (ref 10–24)
BUN: 15 mg/dL (ref 8–27)
CO2: 21 mmol/L (ref 20–29)
Calcium: 9.2 mg/dL (ref 8.6–10.2)
Chloride: 97 mmol/L (ref 96–106)
Creatinine, Ser: 1.35 mg/dL — ABNORMAL HIGH (ref 0.76–1.27)
Glucose: 92 mg/dL (ref 65–99)
Potassium: 4.2 mmol/L (ref 3.5–5.2)
Sodium: 142 mmol/L (ref 134–144)
eGFR: 52 mL/min/{1.73_m2} — ABNORMAL LOW (ref >59)

## 2020-06-11 NOTE — Telephone Encounter (Signed)
Spoke with the patient. Increase lasix to 80 mg tid for now. Next couple days, if swelling does not improve, he may take metolazone 2.5 mg every other day.  I will open up a few days on my schedule in last week of April. You may give him an appt then.  Thanks MJP

## 2020-06-12 DIAGNOSIS — I2510 Atherosclerotic heart disease of native coronary artery without angina pectoris: Secondary | ICD-10-CM | POA: Diagnosis not present

## 2020-06-12 DIAGNOSIS — I4821 Permanent atrial fibrillation: Secondary | ICD-10-CM | POA: Diagnosis not present

## 2020-06-12 DIAGNOSIS — S72142D Displaced intertrochanteric fracture of left femur, subsequent encounter for closed fracture with routine healing: Secondary | ICD-10-CM | POA: Diagnosis not present

## 2020-06-12 DIAGNOSIS — I1310 Hypertensive heart and chronic kidney disease without heart failure, with stage 1 through stage 4 chronic kidney disease, or unspecified chronic kidney disease: Secondary | ICD-10-CM | POA: Diagnosis not present

## 2020-06-12 DIAGNOSIS — S72002D Fracture of unspecified part of neck of left femur, subsequent encounter for closed fracture with routine healing: Secondary | ICD-10-CM | POA: Diagnosis not present

## 2020-06-12 DIAGNOSIS — N1831 Chronic kidney disease, stage 3a: Secondary | ICD-10-CM | POA: Diagnosis not present

## 2020-06-15 ENCOUNTER — Telehealth: Payer: Self-pay

## 2020-06-16 ENCOUNTER — Telehealth: Payer: Self-pay

## 2020-06-16 DIAGNOSIS — N1831 Chronic kidney disease, stage 3a: Secondary | ICD-10-CM | POA: Diagnosis not present

## 2020-06-16 DIAGNOSIS — I1310 Hypertensive heart and chronic kidney disease without heart failure, with stage 1 through stage 4 chronic kidney disease, or unspecified chronic kidney disease: Secondary | ICD-10-CM | POA: Diagnosis not present

## 2020-06-16 DIAGNOSIS — S72002D Fracture of unspecified part of neck of left femur, subsequent encounter for closed fracture with routine healing: Secondary | ICD-10-CM | POA: Diagnosis not present

## 2020-06-16 DIAGNOSIS — I2510 Atherosclerotic heart disease of native coronary artery without angina pectoris: Secondary | ICD-10-CM | POA: Diagnosis not present

## 2020-06-16 DIAGNOSIS — S72142D Displaced intertrochanteric fracture of left femur, subsequent encounter for closed fracture with routine healing: Secondary | ICD-10-CM | POA: Diagnosis not present

## 2020-06-16 DIAGNOSIS — I4821 Permanent atrial fibrillation: Secondary | ICD-10-CM | POA: Diagnosis not present

## 2020-06-16 NOTE — Telephone Encounter (Signed)
Will be seeing tomorrow in office

## 2020-06-16 NOTE — Telephone Encounter (Signed)
Pts oxygen and lungs sounded beautiful last week, but now they cant get his oxygen about 95 and right side of lungs is diminished. Pt sounds a little horse and has a dry cough. Nurse from [facility] said it is a big change from how he was last week. Weight was 190 last week and is now 186. She does not believe that it is weight overload because pts leg swelling has gone down. The nurse is concerned and wants an x-ray done so the pt will be going to urgent care and will have the results sent into the office. The wife does not want to wait until wednesday of next week for pts appointment and would like to be seen sooner as well.

## 2020-06-17 ENCOUNTER — Ambulatory Visit: Payer: Medicare Other | Admitting: Cardiology

## 2020-06-17 ENCOUNTER — Other Ambulatory Visit: Payer: Self-pay

## 2020-06-17 ENCOUNTER — Encounter: Payer: Self-pay | Admitting: Cardiology

## 2020-06-17 VITALS — BP 133/64 | HR 75 | Temp 98.5°F | Resp 17 | Ht 70.0 in | Wt 188.6 lb

## 2020-06-17 DIAGNOSIS — I5032 Chronic diastolic (congestive) heart failure: Secondary | ICD-10-CM | POA: Diagnosis not present

## 2020-06-17 DIAGNOSIS — I272 Pulmonary hypertension, unspecified: Secondary | ICD-10-CM | POA: Diagnosis not present

## 2020-06-17 DIAGNOSIS — I10 Essential (primary) hypertension: Secondary | ICD-10-CM

## 2020-06-17 DIAGNOSIS — R059 Cough, unspecified: Secondary | ICD-10-CM | POA: Diagnosis not present

## 2020-06-17 DIAGNOSIS — I4821 Permanent atrial fibrillation: Secondary | ICD-10-CM

## 2020-06-17 NOTE — Progress Notes (Signed)
   Patient is here for follow up visit. Subjective:   @Patient ID: [name], male    DOB: 02/12/1936, 85 y.o.   MRN: [id number]   Chief Complaint  Patient presents with  . Shortness of Breath    HPI  85-year-old Caucasian male with coronary artery disease status (LAD PCI 2018), now permanentl atrial fibrillation, complex valvular heart disease, mod AI, mod TR, moderate pulmonary hypertension, venous insufficiency, Parkinsons disease, hemorrhoidal GI bleed in 08/2017.  Patient recently had an accidental fall and incurred left femoral fracture. He underwent successful surgery. On discharge, his diuretics were reduced. Subsequently, he developed leg swelling. I increased lasix from 40 mg daily on discharge, to his baseline dose of 80 mg tid. He is now also taking metolazone 2.5 mg once a week. Leg swelling has significantly improved. His wt is down from 220 lb to 208 lb in 10 days. Breathing has improved. Yesterday, his home health nurse noticed decreased lung sounds at right base. Patient had had cough, but now resolved. He denies fever, chills, COVID contacts.     Current Outpatient Medications on File Prior to Visit  Medication Sig Dispense Refill  . acetaminophen (TYLENOL) 325 MG tablet Take 1-2 tablets (325-650 mg total) by mouth every 6 (six) hours as needed for mild pain (pain score 1-3 or temp > 100.5). 100 tablet 0  . apixaban (ELIQUIS) 2.5 MG TABS tablet Take 1 tablet (2.5 mg total) by mouth 2 (two) times daily. (0800 & 2000) 60 tablet 6  . carbidopa-levodopa (SINEMET IR) 25-100 MG tablet TAKE 2 TABLETS BY MOUTH 3 (THREE) TIMES DAILY. 540 tablet 1  . Cholecalciferol (VITAMIN D-3) 1000 units CAPS Take 1,000 Units by mouth daily. (0800)    . dapagliflozin propanediol (FARXIGA) 10 MG TABS tablet Take 1 tablet by mouth daily.    . diltiazem (CARDIZEM CD) 120 MG 24 hr capsule Take 1 capsule (120 mg total) by mouth daily. 90 capsule 3  . docusate sodium (COLACE) 100 MG  capsule Take 1 capsule (100 mg total) by mouth 2 (two) times daily. 10 capsule 0  . furosemide (LASIX) 40 MG tablet Take 1 tablet (40 mg total) by mouth daily. 1 tablet 0  . gabapentin (NEURONTIN) 300 MG capsule Take 1 capsule (300 mg total) by mouth 3 (three) times daily. 270 capsule 1  . Multiple Vitamins-Minerals (PRESERVISION AREDS PO) Take 1 tablet by mouth daily.    . nitroGLYCERIN (NITROSTAT) 0.4 MG SL tablet TAKE 1 TABLET EVERY 5 MINUTES AS NEEDED FOR CHEST PAIN 25 tablet 0  . polyethylene glycol (MIRALAX / GLYCOLAX) packet Take 17 g by mouth 2 (two) times daily. (Patient taking differently: Take 17 g by mouth as needed for mild constipation.) 14 each 0  . potassium chloride (KLOR-CON) 10 MEQ tablet Take 2 tablets (20 mEq total) by mouth 2 (two) times daily. 1 tablet 0  . rosuvastatin (CRESTOR) 10 MG tablet Take 10 mg by mouth daily at 8 pm. (2000)    . senna (SENOKOT) 8.6 MG TABS tablet Take 1 tablet (8.6 mg total) by mouth 2 (two) times daily. 120 tablet 0   No current facility-administered medications on file prior to visit.    Cardiovascular studies:  EKG 06/17/2020: Atrial fibrillation 79 bpm Right bundle branch block  RHC 03/20/2018: WHO Grp II pulmonary hypertension Mean PA 42 mmHg, PVR 2.4 WU Normal RV systolic function (PAPi 2.4) Recommendation: Continue medical management with aggressive diuresis.  Echocardiogram 01/02/2018: Left ventricle cavity is normal   in size. Moderate concentric hypertrophy of the left ventricle. Low normal global wall motion. Visual EF is 50-55%. Unable to evaluate diastolic function due to A. Fibrillation. Calculated EF 51%. Left atrial cavity is severely dilated. Right atrial cavity is moderately dilated. Trileaflet aortic valve with moderate aortic valve leaflet calcification. Trace aortic stenosis. Moderate (Grade II) aortic regurgitation. Moderate (Grade II) mitral regurgitation. Moderate tricuspid regurgitation. Mild pulmonary  hypertension. Estimated pulmonary artery systolic pressure 65 mmHg. Compared to previous echocardiogram on 09/01/2016, there is interval increase in severity of aortic regurgitation, tricuspid regurgitation, and pulmonary hypertension.  Event monitor 04/27/2017 - 05/26/2017: Persistent atrial fibrillation with controlled ventricular rate.  Two episodes of 3 beat nonsustained VT 4.6 second pause at 11:34 PM on 05/03/2017.  3.9 second pause at 7 AM on 05/14/2017. Symptoms not reported. Occasional episodes of atypical atrial flutter with variable conduction  Lower extremity venous duplex 04/07/2017: No evidence of deep vein thrombosis of the right lower extremity with normal venous return. No evidence of valvular incompetence (chronic venous insufficiency) of the right lower extremity.  No evidence of deep vein thrombosis of the left lower extremity with normal venous return. Chronic valvular incompetence (chronic venous insufficiency) of the left lower extremity. Marked reflux noted in the left sapheno-femoral junction (2.5 s), great saphenous proximal thigh (2.6 s), great saphenous mid thigh (1.6 s), great saphenous distal thigh (3.9 s) and great saphenous proximal calf (3.4 s) veins.  Cardioversion 01/26/2017: S/p successful TEE guided cardioversion 100 JS/p successful TEE guided cardioversion 100 J  Coronary angiogram 08/09/2016: FFR guided CSI atherectomy followed by 3.0 x 26 and 3.0 x 18 mm onyx resolute DES to Mid LAD. Mild disease in Cx and RCA.  Lower Extremity Dopplers 06/2009: Pt has Baker Cyst behind both knees.   Recent labs: 06/10/2020: Glucose 92, BUN/Cr 15/1.35. EGFR 52. Na/K 142/4.2.   05/24/2020: H/H 11.6/36.9.  02/10/2020: Glucose 97, BUN/Cr 22/1.54. EGFR 48. Na/K 139/3.8. Rest of the CMP normal  10/10/2019: Glucose 101, BUN/Cr 18/1.37. EGFR 48. Na/K 141/3.6.  NT pro BNP 1547   10/04/2018: Glucose 91, BUN/Cr 21/1.49. EGFR 43.  HbA1C 5.4% Chol 143, TG 63, HDL 65,  LDL 65 TSH 2.8 normal   01/02/2018: Cholesterol 106, triglycerides 48, HDL 46, LDL 50.    Review of Systems  Eyes: Negative for visual disturbance.  Cardiovascular: Positive for leg swelling (Significantly improved). Negative for chest pain, dyspnea on exertion (Significantly improved), palpitations and syncope.  Respiratory: Negative for shortness of breath.   Musculoskeletal: Positive for joint pain (Bilateral knees).  All other systems reviewed and are negative.      Objective:    Vitals:   06/17/20 1416  BP: 133/64  Pulse: 75  Resp: 17  SpO2: 95%   Filed Weights   06/17/20 1416  Weight: 188 lb 9.6 oz (85.5 kg)      Physical Exam Vitals and nursing note reviewed.  Constitutional:      General: He is not in acute distress. Neck:     Vascular: No JVD.  Cardiovascular:     Rate and Rhythm: Normal rate. Rhythm irregularly irregular.     Pulses: Normal pulses and intact distal pulses.     Heart sounds: Murmur heard.   Midsystolic murmur is present with a grade of 3/6. Aortic Area  No gallop. No S3 or S4 sounds.      Comments: S1 is variable, S2 is normal. Pulmonary:     Effort: Pulmonary effort is normal.     Breath sounds:   Normal breath sounds. No wheezing or rales.  Musculoskeletal:        General: Normal range of motion.     Cervical back: Neck supple.     Right lower leg: Edema (1+, baseline) present.     Left lower leg: Edema (1+, baseline) present.  Skin:    General: Skin is warm and dry.         Assessment & Recommendations:   85-year-old Caucasian male with coronary artery disease status (LAD PCI 2018), now permanentl atrial fibrillation, complex valvular heart disease, mod AI, mod TR, moderate pulmonary hypertension, venous insufficiency, Parkinsons disease, hemorrhoidal GI bleed in 08/2017.  Leg edema: Multifactorial, including valvular heart disease,HFpEF, and resting insufficiency. Continue Lasix 80 mg tid, metolazone 2.5 mg once a  week Check BMP in 3 weeks Given recent cough, will check chest Xray  Permanent atrial fibrilation: Rate controlled. CHA2DS2VASc score 3: Annual stroke risk 3.2% Continue Eliquis 2.5 mg bid.  Continue diltiazem 120 mg for rate control.   Valvular disease: Mod aortic, motral, tricuspid regurg, likely all due to atrial fibrilation. Rate control for Afib, as above.  Pulmonary hypertension: WHO Grp II pulmonary hypertension due to left sided heart diseease (HFpEF, valvular heart disease, Afib- RHC 02/2018 showed PCWP 27 mmHg). Recommend continued management of hypertension and diuresis.  Given that we have opted for conservative management for the above cardiac issues, I do not think repeat echocardiogram would necessarily change the management.   H/o GI bleed: S/p hemorrhoidectomy in 08/2017.Currently, no bleeding with stable Hb. Tolerating Eliquis well.  CAD: Stable. Not on aspirin due to bleeding risk. Continue statin. Given his advanced age, renal dysfunction, recommend conservative medical management  F/u 4 weeks   J , MD [facility] Pager: [phone] Office: [phone] If no answer Cell [phone] 

## 2020-06-19 ENCOUNTER — Other Ambulatory Visit: Payer: Self-pay

## 2020-06-19 ENCOUNTER — Ambulatory Visit
Admission: RE | Admit: 2020-06-19 | Discharge: 2020-06-19 | Disposition: A | Discharge status: Home / Self Care | Payer: Medicare Other | Source: Ambulatory Visit | Attending: Cardiology | Admitting: Cardiology

## 2020-06-19 ENCOUNTER — Other Ambulatory Visit: Payer: Self-pay | Admitting: Internal Medicine

## 2020-06-19 ENCOUNTER — Other Ambulatory Visit: Payer: Self-pay | Admitting: Cardiology

## 2020-06-19 DIAGNOSIS — S72142D Displaced intertrochanteric fracture of left femur, subsequent encounter for closed fracture with routine healing: Secondary | ICD-10-CM | POA: Diagnosis not present

## 2020-06-19 DIAGNOSIS — N1831 Chronic kidney disease, stage 3a: Secondary | ICD-10-CM | POA: Diagnosis not present

## 2020-06-19 DIAGNOSIS — I509 Heart failure, unspecified: Secondary | ICD-10-CM

## 2020-06-19 DIAGNOSIS — R0602 Shortness of breath: Secondary | ICD-10-CM

## 2020-06-19 DIAGNOSIS — I1310 Hypertensive heart and chronic kidney disease without heart failure, with stage 1 through stage 4 chronic kidney disease, or unspecified chronic kidney disease: Secondary | ICD-10-CM | POA: Diagnosis not present

## 2020-06-19 DIAGNOSIS — S72002D Fracture of unspecified part of neck of left femur, subsequent encounter for closed fracture with routine healing: Secondary | ICD-10-CM | POA: Diagnosis not present

## 2020-06-19 DIAGNOSIS — I2510 Atherosclerotic heart disease of native coronary artery without angina pectoris: Secondary | ICD-10-CM | POA: Diagnosis not present

## 2020-06-19 DIAGNOSIS — I4821 Permanent atrial fibrillation: Secondary | ICD-10-CM | POA: Diagnosis not present

## 2020-06-19 NOTE — Progress Notes (Signed)
Reviewed and discussed. Chronic right hemidiaphragm elevation. Chronic vascular congestion. Improved aeration since 04/2020 CXR. Recommend incentive spirometry.  [name], MD

## 2020-06-22 DIAGNOSIS — I1310 Hypertensive heart and chronic kidney disease without heart failure, with stage 1 through stage 4 chronic kidney disease, or unspecified chronic kidney disease: Secondary | ICD-10-CM | POA: Diagnosis not present

## 2020-06-22 DIAGNOSIS — S72002D Fracture of unspecified part of neck of left femur, subsequent encounter for closed fracture with routine healing: Secondary | ICD-10-CM | POA: Diagnosis not present

## 2020-06-22 DIAGNOSIS — S72142D Displaced intertrochanteric fracture of left femur, subsequent encounter for closed fracture with routine healing: Secondary | ICD-10-CM | POA: Diagnosis not present

## 2020-06-22 DIAGNOSIS — I4821 Permanent atrial fibrillation: Secondary | ICD-10-CM | POA: Diagnosis not present

## 2020-06-22 DIAGNOSIS — I2510 Atherosclerotic heart disease of native coronary artery without angina pectoris: Secondary | ICD-10-CM | POA: Diagnosis not present

## 2020-06-22 DIAGNOSIS — N1831 Chronic kidney disease, stage 3a: Secondary | ICD-10-CM | POA: Diagnosis not present

## 2020-06-24 ENCOUNTER — Ambulatory Visit: Payer: Medicare Other | Admitting: Cardiology

## 2020-06-24 DIAGNOSIS — S72142D Displaced intertrochanteric fracture of left femur, subsequent encounter for closed fracture with routine healing: Secondary | ICD-10-CM | POA: Diagnosis not present

## 2020-06-24 DIAGNOSIS — N1831 Chronic kidney disease, stage 3a: Secondary | ICD-10-CM | POA: Diagnosis not present

## 2020-06-24 DIAGNOSIS — I2510 Atherosclerotic heart disease of native coronary artery without angina pectoris: Secondary | ICD-10-CM | POA: Diagnosis not present

## 2020-06-24 DIAGNOSIS — I4821 Permanent atrial fibrillation: Secondary | ICD-10-CM | POA: Diagnosis not present

## 2020-06-24 DIAGNOSIS — I1310 Hypertensive heart and chronic kidney disease without heart failure, with stage 1 through stage 4 chronic kidney disease, or unspecified chronic kidney disease: Secondary | ICD-10-CM | POA: Diagnosis not present

## 2020-06-24 DIAGNOSIS — S72002D Fracture of unspecified part of neck of left femur, subsequent encounter for closed fracture with routine healing: Secondary | ICD-10-CM | POA: Diagnosis not present

## 2020-06-25 DIAGNOSIS — S72142D Displaced intertrochanteric fracture of left femur, subsequent encounter for closed fracture with routine healing: Secondary | ICD-10-CM | POA: Diagnosis not present

## 2020-06-25 DIAGNOSIS — Z955 Presence of coronary angioplasty implant and graft: Secondary | ICD-10-CM | POA: Diagnosis not present

## 2020-06-25 DIAGNOSIS — N1831 Chronic kidney disease, stage 3a: Secondary | ICD-10-CM | POA: Diagnosis not present

## 2020-06-25 DIAGNOSIS — I272 Pulmonary hypertension, unspecified: Secondary | ICD-10-CM | POA: Diagnosis not present

## 2020-06-25 DIAGNOSIS — E785 Hyperlipidemia, unspecified: Secondary | ICD-10-CM | POA: Diagnosis not present

## 2020-06-25 DIAGNOSIS — I342 Nonrheumatic mitral (valve) stenosis: Secondary | ICD-10-CM | POA: Diagnosis not present

## 2020-06-25 DIAGNOSIS — I4892 Unspecified atrial flutter: Secondary | ICD-10-CM | POA: Diagnosis not present

## 2020-06-25 DIAGNOSIS — M47816 Spondylosis without myelopathy or radiculopathy, lumbar region: Secondary | ICD-10-CM | POA: Diagnosis not present

## 2020-06-25 DIAGNOSIS — E7800 Pure hypercholesterolemia, unspecified: Secondary | ICD-10-CM | POA: Diagnosis not present

## 2020-06-25 DIAGNOSIS — Z85820 Personal history of malignant melanoma of skin: Secondary | ICD-10-CM | POA: Diagnosis not present

## 2020-06-25 DIAGNOSIS — Z87442 Personal history of urinary calculi: Secondary | ICD-10-CM | POA: Diagnosis not present

## 2020-06-25 DIAGNOSIS — I420 Dilated cardiomyopathy: Secondary | ICD-10-CM | POA: Diagnosis not present

## 2020-06-25 DIAGNOSIS — L03115 Cellulitis of right lower limb: Secondary | ICD-10-CM | POA: Diagnosis not present

## 2020-06-25 DIAGNOSIS — I2510 Atherosclerotic heart disease of native coronary artery without angina pectoris: Secondary | ICD-10-CM | POA: Diagnosis not present

## 2020-06-25 DIAGNOSIS — G20 Parkinson's disease: Secondary | ICD-10-CM | POA: Diagnosis not present

## 2020-06-25 DIAGNOSIS — Z7901 Long term (current) use of anticoagulants: Secondary | ICD-10-CM | POA: Diagnosis not present

## 2020-06-25 DIAGNOSIS — M47818 Spondylosis without myelopathy or radiculopathy, sacral and sacrococcygeal region: Secondary | ICD-10-CM | POA: Diagnosis not present

## 2020-06-25 DIAGNOSIS — M170 Bilateral primary osteoarthritis of knee: Secondary | ICD-10-CM | POA: Diagnosis not present

## 2020-06-25 DIAGNOSIS — I1310 Hypertensive heart and chronic kidney disease without heart failure, with stage 1 through stage 4 chronic kidney disease, or unspecified chronic kidney disease: Secondary | ICD-10-CM | POA: Diagnosis not present

## 2020-06-25 DIAGNOSIS — I4821 Permanent atrial fibrillation: Secondary | ICD-10-CM | POA: Diagnosis not present

## 2020-06-25 DIAGNOSIS — S72002D Fracture of unspecified part of neck of left femur, subsequent encounter for closed fracture with routine healing: Secondary | ICD-10-CM | POA: Diagnosis not present

## 2020-06-25 DIAGNOSIS — I082 Rheumatic disorders of both aortic and tricuspid valves: Secondary | ICD-10-CM | POA: Diagnosis not present

## 2020-06-25 DIAGNOSIS — I4891 Unspecified atrial fibrillation: Secondary | ICD-10-CM | POA: Diagnosis not present

## 2020-06-25 DIAGNOSIS — M47817 Spondylosis without myelopathy or radiculopathy, lumbosacral region: Secondary | ICD-10-CM | POA: Diagnosis not present

## 2020-06-25 DIAGNOSIS — L03116 Cellulitis of left lower limb: Secondary | ICD-10-CM | POA: Diagnosis not present

## 2020-06-26 DIAGNOSIS — S72002D Fracture of unspecified part of neck of left femur, subsequent encounter for closed fracture with routine healing: Secondary | ICD-10-CM | POA: Diagnosis not present

## 2020-06-26 DIAGNOSIS — S72142D Displaced intertrochanteric fracture of left femur, subsequent encounter for closed fracture with routine healing: Secondary | ICD-10-CM | POA: Diagnosis not present

## 2020-06-26 DIAGNOSIS — L03115 Cellulitis of right lower limb: Secondary | ICD-10-CM | POA: Diagnosis not present

## 2020-06-26 DIAGNOSIS — I1310 Hypertensive heart and chronic kidney disease without heart failure, with stage 1 through stage 4 chronic kidney disease, or unspecified chronic kidney disease: Secondary | ICD-10-CM | POA: Diagnosis not present

## 2020-06-26 DIAGNOSIS — I2510 Atherosclerotic heart disease of native coronary artery without angina pectoris: Secondary | ICD-10-CM | POA: Diagnosis not present

## 2020-06-26 DIAGNOSIS — L03116 Cellulitis of left lower limb: Secondary | ICD-10-CM | POA: Diagnosis not present

## 2020-06-30 DIAGNOSIS — L03116 Cellulitis of left lower limb: Secondary | ICD-10-CM | POA: Diagnosis not present

## 2020-06-30 DIAGNOSIS — I10 Essential (primary) hypertension: Secondary | ICD-10-CM | POA: Diagnosis not present

## 2020-06-30 DIAGNOSIS — I1310 Hypertensive heart and chronic kidney disease without heart failure, with stage 1 through stage 4 chronic kidney disease, or unspecified chronic kidney disease: Secondary | ICD-10-CM | POA: Diagnosis not present

## 2020-06-30 DIAGNOSIS — I272 Pulmonary hypertension, unspecified: Secondary | ICD-10-CM | POA: Diagnosis not present

## 2020-06-30 DIAGNOSIS — E782 Mixed hyperlipidemia: Secondary | ICD-10-CM | POA: Diagnosis not present

## 2020-06-30 DIAGNOSIS — Z1389 Encounter for screening for other disorder: Secondary | ICD-10-CM | POA: Diagnosis not present

## 2020-06-30 DIAGNOSIS — I482 Chronic atrial fibrillation, unspecified: Secondary | ICD-10-CM | POA: Diagnosis not present

## 2020-06-30 DIAGNOSIS — G629 Polyneuropathy, unspecified: Secondary | ICD-10-CM | POA: Diagnosis not present

## 2020-06-30 DIAGNOSIS — Z0000 Encounter for general adult medical examination without abnormal findings: Secondary | ICD-10-CM | POA: Diagnosis not present

## 2020-06-30 DIAGNOSIS — L03115 Cellulitis of right lower limb: Secondary | ICD-10-CM | POA: Diagnosis not present

## 2020-06-30 DIAGNOSIS — I2510 Atherosclerotic heart disease of native coronary artery without angina pectoris: Secondary | ICD-10-CM | POA: Diagnosis not present

## 2020-06-30 DIAGNOSIS — N1832 Chronic kidney disease, stage 3b: Secondary | ICD-10-CM | POA: Diagnosis not present

## 2020-06-30 DIAGNOSIS — S72142D Displaced intertrochanteric fracture of left femur, subsequent encounter for closed fracture with routine healing: Secondary | ICD-10-CM | POA: Diagnosis not present

## 2020-06-30 DIAGNOSIS — S72002D Fracture of unspecified part of neck of left femur, subsequent encounter for closed fracture with routine healing: Secondary | ICD-10-CM | POA: Diagnosis not present

## 2020-06-30 DIAGNOSIS — G20 Parkinson's disease: Secondary | ICD-10-CM | POA: Diagnosis not present

## 2020-07-02 ENCOUNTER — Telehealth: Payer: Self-pay

## 2020-07-02 DIAGNOSIS — L03115 Cellulitis of right lower limb: Secondary | ICD-10-CM | POA: Diagnosis not present

## 2020-07-02 DIAGNOSIS — S72142D Displaced intertrochanteric fracture of left femur, subsequent encounter for closed fracture with routine healing: Secondary | ICD-10-CM | POA: Diagnosis not present

## 2020-07-02 DIAGNOSIS — I1310 Hypertensive heart and chronic kidney disease without heart failure, with stage 1 through stage 4 chronic kidney disease, or unspecified chronic kidney disease: Secondary | ICD-10-CM | POA: Diagnosis not present

## 2020-07-02 DIAGNOSIS — L03116 Cellulitis of left lower limb: Secondary | ICD-10-CM | POA: Diagnosis not present

## 2020-07-02 DIAGNOSIS — S72002D Fracture of unspecified part of neck of left femur, subsequent encounter for closed fracture with routine healing: Secondary | ICD-10-CM | POA: Diagnosis not present

## 2020-07-02 DIAGNOSIS — I2510 Atherosclerotic heart disease of native coronary artery without angina pectoris: Secondary | ICD-10-CM | POA: Diagnosis not present

## 2020-07-02 NOTE — Telephone Encounter (Signed)
Spoke w/[facility]. Recommend compression stockings 15-20 mmHg, increase metolazone to 2.5 mg twice a week.  ? [name], MD Pager: [phone] Office: [phone]

## 2020-07-03 DIAGNOSIS — S72002D Fracture of unspecified part of neck of left femur, subsequent encounter for closed fracture with routine healing: Secondary | ICD-10-CM | POA: Diagnosis not present

## 2020-07-03 DIAGNOSIS — L03116 Cellulitis of left lower limb: Secondary | ICD-10-CM | POA: Diagnosis not present

## 2020-07-03 DIAGNOSIS — I2510 Atherosclerotic heart disease of native coronary artery without angina pectoris: Secondary | ICD-10-CM | POA: Diagnosis not present

## 2020-07-03 DIAGNOSIS — I1310 Hypertensive heart and chronic kidney disease without heart failure, with stage 1 through stage 4 chronic kidney disease, or unspecified chronic kidney disease: Secondary | ICD-10-CM | POA: Diagnosis not present

## 2020-07-03 DIAGNOSIS — S72142D Displaced intertrochanteric fracture of left femur, subsequent encounter for closed fracture with routine healing: Secondary | ICD-10-CM | POA: Diagnosis not present

## 2020-07-03 DIAGNOSIS — L03115 Cellulitis of right lower limb: Secondary | ICD-10-CM | POA: Diagnosis not present

## 2020-07-06 DIAGNOSIS — S72142D Displaced intertrochanteric fracture of left femur, subsequent encounter for closed fracture with routine healing: Secondary | ICD-10-CM | POA: Diagnosis not present

## 2020-07-07 ENCOUNTER — Telehealth: Payer: Self-pay

## 2020-07-07 DIAGNOSIS — L03115 Cellulitis of right lower limb: Secondary | ICD-10-CM | POA: Diagnosis not present

## 2020-07-07 DIAGNOSIS — S72142D Displaced intertrochanteric fracture of left femur, subsequent encounter for closed fracture with routine healing: Secondary | ICD-10-CM | POA: Diagnosis not present

## 2020-07-07 DIAGNOSIS — S72002D Fracture of unspecified part of neck of left femur, subsequent encounter for closed fracture with routine healing: Secondary | ICD-10-CM | POA: Diagnosis not present

## 2020-07-07 DIAGNOSIS — I2510 Atherosclerotic heart disease of native coronary artery without angina pectoris: Secondary | ICD-10-CM | POA: Diagnosis not present

## 2020-07-07 DIAGNOSIS — I1310 Hypertensive heart and chronic kidney disease without heart failure, with stage 1 through stage 4 chronic kidney disease, or unspecified chronic kidney disease: Secondary | ICD-10-CM | POA: Diagnosis not present

## 2020-07-07 DIAGNOSIS — L03116 Cellulitis of left lower limb: Secondary | ICD-10-CM | POA: Diagnosis not present

## 2020-07-07 NOTE — Telephone Encounter (Signed)
Denise from [facility] called regarding mutual patient that since increasing his metolazone and his dieting he is down 10 pounds. Patient isn't consuming too much salt as he was. She just wanted to make you aware.  [facility]  [phone]

## 2020-07-07 NOTE — Telephone Encounter (Signed)
Good. As long as no dizziness, I would continue the same dose.  Thanks MJP

## 2020-07-07 NOTE — Telephone Encounter (Signed)
Called [name] to inform her about the message above.

## 2020-07-08 DIAGNOSIS — L03115 Cellulitis of right lower limb: Secondary | ICD-10-CM | POA: Diagnosis not present

## 2020-07-08 DIAGNOSIS — S72002D Fracture of unspecified part of neck of left femur, subsequent encounter for closed fracture with routine healing: Secondary | ICD-10-CM | POA: Diagnosis not present

## 2020-07-08 DIAGNOSIS — I2510 Atherosclerotic heart disease of native coronary artery without angina pectoris: Secondary | ICD-10-CM | POA: Diagnosis not present

## 2020-07-08 DIAGNOSIS — I10 Essential (primary) hypertension: Secondary | ICD-10-CM | POA: Diagnosis not present

## 2020-07-08 DIAGNOSIS — I1310 Hypertensive heart and chronic kidney disease without heart failure, with stage 1 through stage 4 chronic kidney disease, or unspecified chronic kidney disease: Secondary | ICD-10-CM | POA: Diagnosis not present

## 2020-07-08 DIAGNOSIS — L03116 Cellulitis of left lower limb: Secondary | ICD-10-CM | POA: Diagnosis not present

## 2020-07-08 DIAGNOSIS — S72142D Displaced intertrochanteric fracture of left femur, subsequent encounter for closed fracture with routine healing: Secondary | ICD-10-CM | POA: Diagnosis not present

## 2020-07-09 ENCOUNTER — Other Ambulatory Visit: Payer: Self-pay | Admitting: Cardiology

## 2020-07-09 DIAGNOSIS — I5032 Chronic diastolic (congestive) heart failure: Secondary | ICD-10-CM

## 2020-07-09 DIAGNOSIS — S72142D Displaced intertrochanteric fracture of left femur, subsequent encounter for closed fracture with routine healing: Secondary | ICD-10-CM | POA: Diagnosis not present

## 2020-07-09 DIAGNOSIS — I1310 Hypertensive heart and chronic kidney disease without heart failure, with stage 1 through stage 4 chronic kidney disease, or unspecified chronic kidney disease: Secondary | ICD-10-CM | POA: Diagnosis not present

## 2020-07-09 DIAGNOSIS — I2510 Atherosclerotic heart disease of native coronary artery without angina pectoris: Secondary | ICD-10-CM | POA: Diagnosis not present

## 2020-07-09 DIAGNOSIS — L03115 Cellulitis of right lower limb: Secondary | ICD-10-CM | POA: Diagnosis not present

## 2020-07-09 DIAGNOSIS — L03116 Cellulitis of left lower limb: Secondary | ICD-10-CM | POA: Diagnosis not present

## 2020-07-09 DIAGNOSIS — S72002D Fracture of unspecified part of neck of left femur, subsequent encounter for closed fracture with routine healing: Secondary | ICD-10-CM | POA: Diagnosis not present

## 2020-07-09 LAB — BASIC METABOLIC PANEL
BUN/Creatinine Ratio: 15 (ref 10–24)
BUN: 26 mg/dL (ref 8–27)
CO2: 35 mmol/L — ABNORMAL HIGH (ref 20–29)
Calcium: 9.3 mg/dL (ref 8.6–10.2)
Chloride: 85 mmol/L — ABNORMAL LOW (ref 96–106)
Creatinine, Ser: 1.73 mg/dL — ABNORMAL HIGH (ref 0.76–1.27)
Glucose: 91 mg/dL (ref 65–99)
Potassium: 3.2 mmol/L — ABNORMAL LOW (ref 3.5–5.2)
Sodium: 136 mmol/L (ref 134–144)
eGFR: 39 mL/min/{1.73_m2} — ABNORMAL LOW (ref >59)

## 2020-07-10 NOTE — Progress Notes (Signed)
Called and spoke to pts wife regarding lab results. Pt will relay the message.

## 2020-07-14 DIAGNOSIS — I2510 Atherosclerotic heart disease of native coronary artery without angina pectoris: Secondary | ICD-10-CM | POA: Diagnosis not present

## 2020-07-14 DIAGNOSIS — L03116 Cellulitis of left lower limb: Secondary | ICD-10-CM | POA: Diagnosis not present

## 2020-07-14 DIAGNOSIS — I1310 Hypertensive heart and chronic kidney disease without heart failure, with stage 1 through stage 4 chronic kidney disease, or unspecified chronic kidney disease: Secondary | ICD-10-CM | POA: Diagnosis not present

## 2020-07-14 DIAGNOSIS — S72002D Fracture of unspecified part of neck of left femur, subsequent encounter for closed fracture with routine healing: Secondary | ICD-10-CM | POA: Diagnosis not present

## 2020-07-14 DIAGNOSIS — S72142D Displaced intertrochanteric fracture of left femur, subsequent encounter for closed fracture with routine healing: Secondary | ICD-10-CM | POA: Diagnosis not present

## 2020-07-14 DIAGNOSIS — I5032 Chronic diastolic (congestive) heart failure: Secondary | ICD-10-CM | POA: Diagnosis not present

## 2020-07-14 DIAGNOSIS — L03115 Cellulitis of right lower limb: Secondary | ICD-10-CM | POA: Diagnosis not present

## 2020-07-15 LAB — BASIC METABOLIC PANEL
BUN/Creatinine Ratio: 17 (ref 10–24)
BUN: 27 mg/dL (ref 8–27)
CO2: 33 mmol/L — ABNORMAL HIGH (ref 20–29)
Calcium: 9.5 mg/dL (ref 8.6–10.2)
Chloride: 93 mmol/L — ABNORMAL LOW (ref 96–106)
Creatinine, Ser: 1.59 mg/dL — ABNORMAL HIGH (ref 0.76–1.27)
Glucose: 81 mg/dL (ref 65–99)
Potassium: 3.8 mmol/L (ref 3.5–5.2)
Sodium: 142 mmol/L (ref 134–144)
eGFR: 43 mL/min/{1.73_m2} — ABNORMAL LOW (ref >59)

## 2020-07-17 DIAGNOSIS — S72142D Displaced intertrochanteric fracture of left femur, subsequent encounter for closed fracture with routine healing: Secondary | ICD-10-CM | POA: Diagnosis not present

## 2020-07-17 DIAGNOSIS — L03115 Cellulitis of right lower limb: Secondary | ICD-10-CM | POA: Diagnosis not present

## 2020-07-17 DIAGNOSIS — I2510 Atherosclerotic heart disease of native coronary artery without angina pectoris: Secondary | ICD-10-CM | POA: Diagnosis not present

## 2020-07-17 DIAGNOSIS — L03116 Cellulitis of left lower limb: Secondary | ICD-10-CM | POA: Diagnosis not present

## 2020-07-17 DIAGNOSIS — S72002D Fracture of unspecified part of neck of left femur, subsequent encounter for closed fracture with routine healing: Secondary | ICD-10-CM | POA: Diagnosis not present

## 2020-07-17 DIAGNOSIS — I1310 Hypertensive heart and chronic kidney disease without heart failure, with stage 1 through stage 4 chronic kidney disease, or unspecified chronic kidney disease: Secondary | ICD-10-CM | POA: Diagnosis not present

## 2020-07-22 DIAGNOSIS — S72002D Fracture of unspecified part of neck of left femur, subsequent encounter for closed fracture with routine healing: Secondary | ICD-10-CM | POA: Diagnosis not present

## 2020-07-22 DIAGNOSIS — S72142D Displaced intertrochanteric fracture of left femur, subsequent encounter for closed fracture with routine healing: Secondary | ICD-10-CM | POA: Diagnosis not present

## 2020-07-22 DIAGNOSIS — L03115 Cellulitis of right lower limb: Secondary | ICD-10-CM | POA: Diagnosis not present

## 2020-07-22 DIAGNOSIS — I1310 Hypertensive heart and chronic kidney disease without heart failure, with stage 1 through stage 4 chronic kidney disease, or unspecified chronic kidney disease: Secondary | ICD-10-CM | POA: Diagnosis not present

## 2020-07-22 DIAGNOSIS — L03116 Cellulitis of left lower limb: Secondary | ICD-10-CM | POA: Diagnosis not present

## 2020-07-22 DIAGNOSIS — I2510 Atherosclerotic heart disease of native coronary artery without angina pectoris: Secondary | ICD-10-CM | POA: Diagnosis not present

## 2020-07-22 NOTE — Progress Notes (Signed)
Patient is here for follow up visit. Subjective:   '@Patient'  ID: [name], male    DOB: Feb 11, 1936, 85 y.o.   MRN: [id number]   Chief Complaint  Patient presents with  . Hypertension  . Follow-up    4 week  . Leg Swelling    HPI  85 year old Caucasian male with coronary artery disease status (LAD PCI 2018), now permanentl atrial fibrillation, complex valvular heart disease, mod AI, mod TR, moderate pulmonary hypertension, venous insufficiency, Parkinsons disease, hemorrhoidal GI bleed in 08/2017.  Patients leg edema and breathing is stable. He continues to have pain in left knee and hip since his femur fracture surgery. Patient is going to see his orthopedic doctor soon.   Reviewed recent labs. He is currently taking lasix 80 mg tid, metolazone 2.5 mg once a week.   Current Outpatient Medications on File Prior to Visit  Medication Sig Dispense Refill  . acetaminophen (TYLENOL) 325 MG tablet Take 1-2 tablets (325-650 mg total) by mouth every 6 (six) hours as needed for mild pain (pain score 1-3 or temp > 100.5). 100 tablet 0  . apixaban (ELIQUIS) 2.5 MG TABS tablet Take 1 tablet (2.5 mg total) by mouth 2 (two) times daily. (0800 & 2000) 60 tablet 6  . carbidopa-levodopa (SINEMET IR) 25-100 MG tablet TAKE 2 TABLETS BY MOUTH 3 (THREE) TIMES DAILY. 540 tablet 1  . Cholecalciferol (VITAMIN D-3) 1000 units CAPS Take 1,000 Units by mouth daily. (0800)    . dapagliflozin propanediol (FARXIGA) 10 MG TABS tablet Take 1 tablet by mouth daily.    [name] diltiazem (CARDIZEM CD) 120 MG 24 hr capsule Take 1 capsule (120 mg total) by mouth daily. 90 capsule 3  . docusate sodium (COLACE) 100 MG capsule Take 1 capsule (100 mg total) by mouth 2 (two) times daily. 10 capsule 0  . furosemide (LASIX) 40 MG tablet Take 1 tablet (40 mg total) by mouth daily. (Patient taking differently: Take 80 mg by mouth in the morning, at noon, and at bedtime.) 1 tablet 0  . gabapentin (NEURONTIN) 300 MG  capsule Take 1 capsule (300 mg total) by mouth 3 (three) times daily. 270 capsule 1  . metolazone (ZAROXOLYN) 2.5 MG tablet Take 2.5 mg by mouth daily.    . Multiple Vitamins-Minerals (PRESERVISION AREDS PO) Take 1 tablet by mouth daily.    . nitroGLYCERIN (NITROSTAT) 0.4 MG SL tablet TAKE 1 TABLET EVERY 5 MINUTES AS NEEDED FOR CHEST PAIN 25 tablet 0  . polyethylene glycol (MIRALAX / GLYCOLAX) packet Take 17 g by mouth 2 (two) times daily. (Patient taking differently: Take 17 g by mouth as needed for mild constipation.) 14 each 0  . potassium chloride (KLOR-CON) 10 MEQ tablet Take 2 tablets (20 mEq total) by mouth 2 (two) times daily. 1 tablet 0  . rosuvastatin (CRESTOR) 10 MG tablet Take 10 mg by mouth daily at 8 pm. (2000)    . senna (SENOKOT) 8.6 MG TABS tablet Take 1 tablet (8.6 mg total) by mouth 2 (two) times daily. 120 tablet 0   No current facility-administered medications on file prior to visit.    Cardiovascular studies:  EKG 06/17/2020: Atrial fibrillation 79 bpm Right bundle branch block  RHC 03/20/2018: WHO Grp II pulmonary hypertension Mean PA 42 mmHg, PVR 2.4 WU Normal RV systolic function (PAPi 2.4) Recommendation: Continue medical management with aggressive diuresis.  Echocardiogram 01/02/2018: Left ventricle cavity is normal in size. Moderate concentric hypertrophy of the left ventricle. Low normal  global wall motion. Visual EF is 50-55%. Unable to evaluate diastolic function due to A. Fibrillation. Calculated EF 51%. Left atrial cavity is severely dilated. Right atrial cavity is moderately dilated. Trileaflet aortic valve with moderate aortic valve leaflet calcification. Trace aortic stenosis. Moderate (Grade II) aortic regurgitation. Moderate (Grade II) mitral regurgitation. Moderate tricuspid regurgitation. Mild pulmonary hypertension. Estimated pulmonary artery systolic pressure 65 mmHg. Compared to previous echocardiogram on 09/01/2016, there is interval increase  in severity of aortic regurgitation, tricuspid regurgitation, and pulmonary hypertension.  Event monitor 04/27/2017 - 05/26/2017: Persistent atrial fibrillation with controlled ventricular rate.  Two episodes of 3 beat nonsustained VT 4.6 second pause at 11:34 PM on 05/03/2017.  3.9 second pause at 7 AM on 05/14/2017. Symptoms not reported. Occasional episodes of atypical atrial flutter with variable conduction  Lower extremity venous duplex 04/07/2017: No evidence of deep vein thrombosis of the right lower extremity with normal venous return. No evidence of valvular incompetence (chronic venous insufficiency) of the right lower extremity.  No evidence of deep vein thrombosis of the left lower extremity with normal venous return. Chronic valvular incompetence (chronic venous insufficiency) of the left lower extremity. Marked reflux noted in the left sapheno-femoral junction (2.5 s), great saphenous proximal thigh (2.6 s), great saphenous mid thigh (1.6 s), great saphenous distal thigh (3.9 s) and great saphenous proximal calf (3.4 s) veins.  Cardioversion 01/26/2017: S/p successful TEE guided cardioversion 100 JS/p successful TEE guided cardioversion 100 J  Coronary angiogram 08/09/2016: FFR guided CSI atherectomy followed by 3.0 x 26 and 3.0 x 18 mm onyx resolute DES to Mid LAD. Mild disease in Cx and RCA.  Lower Extremity Dopplers 06/2009: Pt has Baker Cyst behind both knees.   Recent labs: 07/14/2020: Glucose 81, BUN/Cr 27/1.59. EGFR 43. Na/K 142/3.8  07/08/2020: Glucose 91, BUN/Cr 26/1.73. EGFR 39. Na/K 136/3.2  06/10/2020: Glucose 92, BUN/Cr 15/1.35. EGFR 52. Na/K 142/4.2.   05/24/2020: H/H 11.6/36.9.  02/10/2020: Glucose 97, BUN/Cr 22/1.54. EGFR 48. Na/K 139/3.8. Rest of the CMP normal  10/10/2019: Glucose 101, BUN/Cr 18/1.37. EGFR 48. Na/K 141/3.6.  NT pro BNP 1547   10/04/2018: Glucose 91, BUN/Cr 21/1.49. EGFR 43.  HbA1C 5.4% Chol 143, TG 63, HDL 65, LDL 65 TSH 2.8  normal   01/02/2018: Cholesterol 106, triglycerides 48, HDL 46, LDL 50.    Review of Systems  Eyes: Negative for visual disturbance.  Cardiovascular: Positive for leg swelling (Significantly improved). Negative for chest pain, dyspnea on exertion (Significantly improved), palpitations and syncope.  Respiratory: Negative for shortness of breath.   Musculoskeletal: Positive for joint pain (Bilateral knees).  All other systems reviewed and are negative.      Objective:    Vitals:   07/23/20 1200  BP: 122/71  Pulse: 73  Resp: 17  Temp: 97.8 F (36.6 C)  SpO2: 96%   Filed Weights   07/23/20 1200  Weight: 193 lb (87.5 kg)      Physical Exam Vitals and nursing note reviewed.  Constitutional:      General: He is not in acute distress. Neck:     Vascular: No JVD.  Cardiovascular:     Rate and Rhythm: Normal rate. Rhythm irregularly irregular.     Pulses: Normal pulses and intact distal pulses.     Heart sounds: Murmur heard.   Midsystolic murmur is present with a grade of 3/6. Aortic Area  No gallop. No S3 or S4 sounds.      Comments: S1 is variable, S2 is normal. Pulmonary:  Effort: Pulmonary effort is normal.     Breath sounds: Normal breath sounds. No wheezing or rales.  Musculoskeletal:        General: Normal range of motion.     Cervical back: Neck supple.     Right lower leg: Edema (1+, baseline) present.     Left lower leg: Edema (1+, baseline) present.  Skin:    General: Skin is warm and dry.         Assessment & Recommendations:   85 year old Caucasian male with coronary artery disease status (LAD PCI 2018), now permanentl atrial fibrillation, complex valvular heart disease, mod AI, mod TR, moderate pulmonary hypertension, venous insufficiency, Parkinsons disease, hemorrhoidal GI bleed in 08/2017.  Leg edema: Improved. Continue Lasix 80 mg tid, metolazone 2.5 mg once a week  Permanent atrial fibrilation: Rate controlled. CHA2DS2VASc score 3:  Annual stroke risk 3.2% Continue Eliquis 2.5 mg bid.  Continue diltiazem 120 mg for rate control.   Valvular disease: Mod aortic, motral, tricuspid regurg, likely all due to atrial fibrilation. Rate control for Afib, as above.  Pulmonary hypertension: WHO Grp II pulmonary hypertension due to left sided heart diseease (HFpEF, valvular heart disease, Afib- RHC 02/2018 showed PCWP 27 mmHg). Recommend continued management of hypertension and diuresis.  Given that we have opted for conservative management for the above cardiac issues, I do not think repeat echocardiogram would necessarily change the management.   H/o GI bleed: S/p hemorrhoidectomy in 08/2017.Currently, no bleeding with stable Hb. Tolerating Eliquis well.  CAD: Stable. Not on aspirin due to bleeding risk. Continue statin. Given his advanced age, renal dysfunction, recommend conservative medical management  F/u 3 months  Syniah Berne Esther Hardy, MD [facility] Pager: [phone] Office: [phone] If no answer Cell [phone]

## 2020-07-23 ENCOUNTER — Ambulatory Visit: Payer: Medicare Other | Admitting: Cardiology

## 2020-07-23 ENCOUNTER — Other Ambulatory Visit: Payer: Self-pay

## 2020-07-23 ENCOUNTER — Encounter: Payer: Self-pay | Admitting: Cardiology

## 2020-07-23 VITALS — BP 122/71 | HR 73 | Temp 97.8°F | Resp 17 | Ht 70.0 in | Wt 193.0 lb

## 2020-07-23 DIAGNOSIS — I4821 Permanent atrial fibrillation: Secondary | ICD-10-CM | POA: Diagnosis not present

## 2020-07-23 DIAGNOSIS — I10 Essential (primary) hypertension: Secondary | ICD-10-CM

## 2020-07-23 DIAGNOSIS — I5032 Chronic diastolic (congestive) heart failure: Secondary | ICD-10-CM

## 2020-07-24 DIAGNOSIS — I1310 Hypertensive heart and chronic kidney disease without heart failure, with stage 1 through stage 4 chronic kidney disease, or unspecified chronic kidney disease: Secondary | ICD-10-CM | POA: Diagnosis not present

## 2020-07-24 DIAGNOSIS — L03115 Cellulitis of right lower limb: Secondary | ICD-10-CM | POA: Diagnosis not present

## 2020-07-24 DIAGNOSIS — L03116 Cellulitis of left lower limb: Secondary | ICD-10-CM | POA: Diagnosis not present

## 2020-07-24 DIAGNOSIS — I2510 Atherosclerotic heart disease of native coronary artery without angina pectoris: Secondary | ICD-10-CM | POA: Diagnosis not present

## 2020-07-24 DIAGNOSIS — S72142D Displaced intertrochanteric fracture of left femur, subsequent encounter for closed fracture with routine healing: Secondary | ICD-10-CM | POA: Diagnosis not present

## 2020-07-24 DIAGNOSIS — S72002D Fracture of unspecified part of neck of left femur, subsequent encounter for closed fracture with routine healing: Secondary | ICD-10-CM | POA: Diagnosis not present

## 2020-07-25 DIAGNOSIS — Z955 Presence of coronary angioplasty implant and graft: Secondary | ICD-10-CM | POA: Diagnosis not present

## 2020-07-25 DIAGNOSIS — E7800 Pure hypercholesterolemia, unspecified: Secondary | ICD-10-CM | POA: Diagnosis not present

## 2020-07-25 DIAGNOSIS — M47817 Spondylosis without myelopathy or radiculopathy, lumbosacral region: Secondary | ICD-10-CM | POA: Diagnosis not present

## 2020-07-25 DIAGNOSIS — Z7901 Long term (current) use of anticoagulants: Secondary | ICD-10-CM | POA: Diagnosis not present

## 2020-07-25 DIAGNOSIS — I082 Rheumatic disorders of both aortic and tricuspid valves: Secondary | ICD-10-CM | POA: Diagnosis not present

## 2020-07-25 DIAGNOSIS — I420 Dilated cardiomyopathy: Secondary | ICD-10-CM | POA: Diagnosis not present

## 2020-07-25 DIAGNOSIS — I272 Pulmonary hypertension, unspecified: Secondary | ICD-10-CM | POA: Diagnosis not present

## 2020-07-25 DIAGNOSIS — Z8546 Personal history of malignant neoplasm of prostate: Secondary | ICD-10-CM | POA: Diagnosis not present

## 2020-07-25 DIAGNOSIS — I4821 Permanent atrial fibrillation: Secondary | ICD-10-CM | POA: Diagnosis not present

## 2020-07-25 DIAGNOSIS — S72002D Fracture of unspecified part of neck of left femur, subsequent encounter for closed fracture with routine healing: Secondary | ICD-10-CM | POA: Diagnosis not present

## 2020-07-25 DIAGNOSIS — M47818 Spondylosis without myelopathy or radiculopathy, sacral and sacrococcygeal region: Secondary | ICD-10-CM | POA: Diagnosis not present

## 2020-07-25 DIAGNOSIS — Z85820 Personal history of malignant melanoma of skin: Secondary | ICD-10-CM | POA: Diagnosis not present

## 2020-07-25 DIAGNOSIS — S72142D Displaced intertrochanteric fracture of left femur, subsequent encounter for closed fracture with routine healing: Secondary | ICD-10-CM | POA: Diagnosis not present

## 2020-07-25 DIAGNOSIS — M170 Bilateral primary osteoarthritis of knee: Secondary | ICD-10-CM | POA: Diagnosis not present

## 2020-07-25 DIAGNOSIS — Z9181 History of falling: Secondary | ICD-10-CM | POA: Diagnosis not present

## 2020-07-25 DIAGNOSIS — I342 Nonrheumatic mitral (valve) stenosis: Secondary | ICD-10-CM | POA: Diagnosis not present

## 2020-07-25 DIAGNOSIS — Z87442 Personal history of urinary calculi: Secondary | ICD-10-CM | POA: Diagnosis not present

## 2020-07-25 DIAGNOSIS — M47816 Spondylosis without myelopathy or radiculopathy, lumbar region: Secondary | ICD-10-CM | POA: Diagnosis not present

## 2020-07-25 DIAGNOSIS — G20 Parkinson's disease: Secondary | ICD-10-CM | POA: Diagnosis not present

## 2020-07-25 DIAGNOSIS — E785 Hyperlipidemia, unspecified: Secondary | ICD-10-CM | POA: Diagnosis not present

## 2020-07-25 DIAGNOSIS — I4892 Unspecified atrial flutter: Secondary | ICD-10-CM | POA: Diagnosis not present

## 2020-07-25 DIAGNOSIS — I2510 Atherosclerotic heart disease of native coronary artery without angina pectoris: Secondary | ICD-10-CM | POA: Diagnosis not present

## 2020-07-25 DIAGNOSIS — N1831 Chronic kidney disease, stage 3a: Secondary | ICD-10-CM | POA: Diagnosis not present

## 2020-07-25 DIAGNOSIS — I1310 Hypertensive heart and chronic kidney disease without heart failure, with stage 1 through stage 4 chronic kidney disease, or unspecified chronic kidney disease: Secondary | ICD-10-CM | POA: Diagnosis not present

## 2020-07-29 DIAGNOSIS — S72142D Displaced intertrochanteric fracture of left femur, subsequent encounter for closed fracture with routine healing: Secondary | ICD-10-CM | POA: Diagnosis not present

## 2020-07-29 DIAGNOSIS — N1831 Chronic kidney disease, stage 3a: Secondary | ICD-10-CM | POA: Diagnosis not present

## 2020-07-29 DIAGNOSIS — I1310 Hypertensive heart and chronic kidney disease without heart failure, with stage 1 through stage 4 chronic kidney disease, or unspecified chronic kidney disease: Secondary | ICD-10-CM | POA: Diagnosis not present

## 2020-07-29 DIAGNOSIS — I2510 Atherosclerotic heart disease of native coronary artery without angina pectoris: Secondary | ICD-10-CM | POA: Diagnosis not present

## 2020-07-29 DIAGNOSIS — S72002D Fracture of unspecified part of neck of left femur, subsequent encounter for closed fracture with routine healing: Secondary | ICD-10-CM | POA: Diagnosis not present

## 2020-07-29 DIAGNOSIS — G20 Parkinson's disease: Secondary | ICD-10-CM | POA: Diagnosis not present

## 2020-07-30 DIAGNOSIS — N1831 Chronic kidney disease, stage 3a: Secondary | ICD-10-CM | POA: Diagnosis not present

## 2020-07-30 DIAGNOSIS — S72002D Fracture of unspecified part of neck of left femur, subsequent encounter for closed fracture with routine healing: Secondary | ICD-10-CM | POA: Diagnosis not present

## 2020-07-30 DIAGNOSIS — I1310 Hypertensive heart and chronic kidney disease without heart failure, with stage 1 through stage 4 chronic kidney disease, or unspecified chronic kidney disease: Secondary | ICD-10-CM | POA: Diagnosis not present

## 2020-07-30 DIAGNOSIS — S72142D Displaced intertrochanteric fracture of left femur, subsequent encounter for closed fracture with routine healing: Secondary | ICD-10-CM | POA: Diagnosis not present

## 2020-07-30 DIAGNOSIS — I2510 Atherosclerotic heart disease of native coronary artery without angina pectoris: Secondary | ICD-10-CM | POA: Diagnosis not present

## 2020-07-30 DIAGNOSIS — G20 Parkinson's disease: Secondary | ICD-10-CM | POA: Diagnosis not present

## 2020-08-03 DIAGNOSIS — I2510 Atherosclerotic heart disease of native coronary artery without angina pectoris: Secondary | ICD-10-CM | POA: Diagnosis not present

## 2020-08-03 DIAGNOSIS — G20 Parkinson's disease: Secondary | ICD-10-CM | POA: Diagnosis not present

## 2020-08-03 DIAGNOSIS — S72002D Fracture of unspecified part of neck of left femur, subsequent encounter for closed fracture with routine healing: Secondary | ICD-10-CM | POA: Diagnosis not present

## 2020-08-03 DIAGNOSIS — N1831 Chronic kidney disease, stage 3a: Secondary | ICD-10-CM | POA: Diagnosis not present

## 2020-08-03 DIAGNOSIS — I1310 Hypertensive heart and chronic kidney disease without heart failure, with stage 1 through stage 4 chronic kidney disease, or unspecified chronic kidney disease: Secondary | ICD-10-CM | POA: Diagnosis not present

## 2020-08-03 DIAGNOSIS — S72142D Displaced intertrochanteric fracture of left femur, subsequent encounter for closed fracture with routine healing: Secondary | ICD-10-CM | POA: Diagnosis not present

## 2020-08-03 NOTE — Telephone Encounter (Signed)
Error

## 2020-08-04 DIAGNOSIS — G20 Parkinson's disease: Secondary | ICD-10-CM | POA: Diagnosis not present

## 2020-08-04 DIAGNOSIS — N1831 Chronic kidney disease, stage 3a: Secondary | ICD-10-CM | POA: Diagnosis not present

## 2020-08-04 DIAGNOSIS — S72002D Fracture of unspecified part of neck of left femur, subsequent encounter for closed fracture with routine healing: Secondary | ICD-10-CM | POA: Diagnosis not present

## 2020-08-04 DIAGNOSIS — S72142D Displaced intertrochanteric fracture of left femur, subsequent encounter for closed fracture with routine healing: Secondary | ICD-10-CM | POA: Diagnosis not present

## 2020-08-04 DIAGNOSIS — I2510 Atherosclerotic heart disease of native coronary artery without angina pectoris: Secondary | ICD-10-CM | POA: Diagnosis not present

## 2020-08-04 DIAGNOSIS — Z23 Encounter for immunization: Secondary | ICD-10-CM | POA: Diagnosis not present

## 2020-08-04 DIAGNOSIS — I1310 Hypertensive heart and chronic kidney disease without heart failure, with stage 1 through stage 4 chronic kidney disease, or unspecified chronic kidney disease: Secondary | ICD-10-CM | POA: Diagnosis not present

## 2020-08-06 DIAGNOSIS — D225 Melanocytic nevi of trunk: Secondary | ICD-10-CM | POA: Diagnosis not present

## 2020-08-06 DIAGNOSIS — Z85820 Personal history of malignant melanoma of skin: Secondary | ICD-10-CM | POA: Diagnosis not present

## 2020-08-06 DIAGNOSIS — N1831 Chronic kidney disease, stage 3a: Secondary | ICD-10-CM | POA: Diagnosis not present

## 2020-08-06 DIAGNOSIS — S72002D Fracture of unspecified part of neck of left femur, subsequent encounter for closed fracture with routine healing: Secondary | ICD-10-CM | POA: Diagnosis not present

## 2020-08-06 DIAGNOSIS — I2510 Atherosclerotic heart disease of native coronary artery without angina pectoris: Secondary | ICD-10-CM | POA: Diagnosis not present

## 2020-08-06 DIAGNOSIS — S72142D Displaced intertrochanteric fracture of left femur, subsequent encounter for closed fracture with routine healing: Secondary | ICD-10-CM | POA: Diagnosis not present

## 2020-08-06 DIAGNOSIS — L821 Other seborrheic keratosis: Secondary | ICD-10-CM | POA: Diagnosis not present

## 2020-08-06 DIAGNOSIS — Z85828 Personal history of other malignant neoplasm of skin: Secondary | ICD-10-CM | POA: Diagnosis not present

## 2020-08-06 DIAGNOSIS — G20 Parkinson's disease: Secondary | ICD-10-CM | POA: Diagnosis not present

## 2020-08-06 DIAGNOSIS — I1310 Hypertensive heart and chronic kidney disease without heart failure, with stage 1 through stage 4 chronic kidney disease, or unspecified chronic kidney disease: Secondary | ICD-10-CM | POA: Diagnosis not present

## 2020-08-06 DIAGNOSIS — L570 Actinic keratosis: Secondary | ICD-10-CM | POA: Diagnosis not present

## 2020-08-06 DIAGNOSIS — L218 Other seborrheic dermatitis: Secondary | ICD-10-CM | POA: Diagnosis not present

## 2020-08-06 DIAGNOSIS — L820 Inflamed seborrheic keratosis: Secondary | ICD-10-CM | POA: Diagnosis not present

## 2020-08-11 DIAGNOSIS — S72142D Displaced intertrochanteric fracture of left femur, subsequent encounter for closed fracture with routine healing: Secondary | ICD-10-CM | POA: Diagnosis not present

## 2020-08-11 DIAGNOSIS — I2510 Atherosclerotic heart disease of native coronary artery without angina pectoris: Secondary | ICD-10-CM | POA: Diagnosis not present

## 2020-08-11 DIAGNOSIS — N1831 Chronic kidney disease, stage 3a: Secondary | ICD-10-CM | POA: Diagnosis not present

## 2020-08-11 DIAGNOSIS — G20 Parkinson's disease: Secondary | ICD-10-CM | POA: Diagnosis not present

## 2020-08-11 DIAGNOSIS — S72002D Fracture of unspecified part of neck of left femur, subsequent encounter for closed fracture with routine healing: Secondary | ICD-10-CM | POA: Diagnosis not present

## 2020-08-11 DIAGNOSIS — I1310 Hypertensive heart and chronic kidney disease without heart failure, with stage 1 through stage 4 chronic kidney disease, or unspecified chronic kidney disease: Secondary | ICD-10-CM | POA: Diagnosis not present

## 2020-08-14 DIAGNOSIS — I2510 Atherosclerotic heart disease of native coronary artery without angina pectoris: Secondary | ICD-10-CM | POA: Diagnosis not present

## 2020-08-14 DIAGNOSIS — S72002D Fracture of unspecified part of neck of left femur, subsequent encounter for closed fracture with routine healing: Secondary | ICD-10-CM | POA: Diagnosis not present

## 2020-08-14 DIAGNOSIS — G20 Parkinson's disease: Secondary | ICD-10-CM | POA: Diagnosis not present

## 2020-08-14 DIAGNOSIS — S72142D Displaced intertrochanteric fracture of left femur, subsequent encounter for closed fracture with routine healing: Secondary | ICD-10-CM | POA: Diagnosis not present

## 2020-08-14 DIAGNOSIS — I1310 Hypertensive heart and chronic kidney disease without heart failure, with stage 1 through stage 4 chronic kidney disease, or unspecified chronic kidney disease: Secondary | ICD-10-CM | POA: Diagnosis not present

## 2020-08-14 DIAGNOSIS — N1831 Chronic kidney disease, stage 3a: Secondary | ICD-10-CM | POA: Diagnosis not present

## 2020-08-17 DIAGNOSIS — S72142D Displaced intertrochanteric fracture of left femur, subsequent encounter for closed fracture with routine healing: Secondary | ICD-10-CM | POA: Diagnosis not present

## 2020-08-17 DIAGNOSIS — M170 Bilateral primary osteoarthritis of knee: Secondary | ICD-10-CM | POA: Diagnosis not present

## 2020-08-18 DIAGNOSIS — N1831 Chronic kidney disease, stage 3a: Secondary | ICD-10-CM | POA: Diagnosis not present

## 2020-08-18 DIAGNOSIS — I2510 Atherosclerotic heart disease of native coronary artery without angina pectoris: Secondary | ICD-10-CM | POA: Diagnosis not present

## 2020-08-18 DIAGNOSIS — I1310 Hypertensive heart and chronic kidney disease without heart failure, with stage 1 through stage 4 chronic kidney disease, or unspecified chronic kidney disease: Secondary | ICD-10-CM | POA: Diagnosis not present

## 2020-08-18 DIAGNOSIS — S72002D Fracture of unspecified part of neck of left femur, subsequent encounter for closed fracture with routine healing: Secondary | ICD-10-CM | POA: Diagnosis not present

## 2020-08-18 DIAGNOSIS — G20 Parkinson's disease: Secondary | ICD-10-CM | POA: Diagnosis not present

## 2020-08-18 DIAGNOSIS — S72142D Displaced intertrochanteric fracture of left femur, subsequent encounter for closed fracture with routine healing: Secondary | ICD-10-CM | POA: Diagnosis not present

## 2020-08-19 ENCOUNTER — Ambulatory Visit: Payer: Medicare Other | Admitting: Cardiology

## 2020-08-19 DIAGNOSIS — G20 Parkinson's disease: Secondary | ICD-10-CM | POA: Diagnosis not present

## 2020-08-19 DIAGNOSIS — I1310 Hypertensive heart and chronic kidney disease without heart failure, with stage 1 through stage 4 chronic kidney disease, or unspecified chronic kidney disease: Secondary | ICD-10-CM | POA: Diagnosis not present

## 2020-08-19 DIAGNOSIS — I2510 Atherosclerotic heart disease of native coronary artery without angina pectoris: Secondary | ICD-10-CM | POA: Diagnosis not present

## 2020-08-19 DIAGNOSIS — N1831 Chronic kidney disease, stage 3a: Secondary | ICD-10-CM | POA: Diagnosis not present

## 2020-08-19 DIAGNOSIS — S72002D Fracture of unspecified part of neck of left femur, subsequent encounter for closed fracture with routine healing: Secondary | ICD-10-CM | POA: Diagnosis not present

## 2020-08-19 DIAGNOSIS — S72142D Displaced intertrochanteric fracture of left femur, subsequent encounter for closed fracture with routine healing: Secondary | ICD-10-CM | POA: Diagnosis not present

## 2020-08-24 DIAGNOSIS — M47816 Spondylosis without myelopathy or radiculopathy, lumbar region: Secondary | ICD-10-CM | POA: Diagnosis not present

## 2020-08-24 DIAGNOSIS — E785 Hyperlipidemia, unspecified: Secondary | ICD-10-CM | POA: Diagnosis not present

## 2020-08-24 DIAGNOSIS — I272 Pulmonary hypertension, unspecified: Secondary | ICD-10-CM | POA: Diagnosis not present

## 2020-08-24 DIAGNOSIS — M170 Bilateral primary osteoarthritis of knee: Secondary | ICD-10-CM | POA: Diagnosis not present

## 2020-08-24 DIAGNOSIS — I4821 Permanent atrial fibrillation: Secondary | ICD-10-CM | POA: Diagnosis not present

## 2020-08-24 DIAGNOSIS — E7800 Pure hypercholesterolemia, unspecified: Secondary | ICD-10-CM | POA: Diagnosis not present

## 2020-08-24 DIAGNOSIS — Z9181 History of falling: Secondary | ICD-10-CM | POA: Diagnosis not present

## 2020-08-24 DIAGNOSIS — S72142D Displaced intertrochanteric fracture of left femur, subsequent encounter for closed fracture with routine healing: Secondary | ICD-10-CM | POA: Diagnosis not present

## 2020-08-24 DIAGNOSIS — I082 Rheumatic disorders of both aortic and tricuspid valves: Secondary | ICD-10-CM | POA: Diagnosis not present

## 2020-08-24 DIAGNOSIS — I2510 Atherosclerotic heart disease of native coronary artery without angina pectoris: Secondary | ICD-10-CM | POA: Diagnosis not present

## 2020-08-24 DIAGNOSIS — G20 Parkinson's disease: Secondary | ICD-10-CM | POA: Diagnosis not present

## 2020-08-24 DIAGNOSIS — Z85820 Personal history of malignant melanoma of skin: Secondary | ICD-10-CM | POA: Diagnosis not present

## 2020-08-24 DIAGNOSIS — S72002D Fracture of unspecified part of neck of left femur, subsequent encounter for closed fracture with routine healing: Secondary | ICD-10-CM | POA: Diagnosis not present

## 2020-08-24 DIAGNOSIS — Z955 Presence of coronary angioplasty implant and graft: Secondary | ICD-10-CM | POA: Diagnosis not present

## 2020-08-24 DIAGNOSIS — I1310 Hypertensive heart and chronic kidney disease without heart failure, with stage 1 through stage 4 chronic kidney disease, or unspecified chronic kidney disease: Secondary | ICD-10-CM | POA: Diagnosis not present

## 2020-08-24 DIAGNOSIS — I342 Nonrheumatic mitral (valve) stenosis: Secondary | ICD-10-CM | POA: Diagnosis not present

## 2020-08-24 DIAGNOSIS — Z87442 Personal history of urinary calculi: Secondary | ICD-10-CM | POA: Diagnosis not present

## 2020-08-24 DIAGNOSIS — I4892 Unspecified atrial flutter: Secondary | ICD-10-CM | POA: Diagnosis not present

## 2020-08-24 DIAGNOSIS — I420 Dilated cardiomyopathy: Secondary | ICD-10-CM | POA: Diagnosis not present

## 2020-08-24 DIAGNOSIS — N1831 Chronic kidney disease, stage 3a: Secondary | ICD-10-CM | POA: Diagnosis not present

## 2020-08-24 DIAGNOSIS — M47817 Spondylosis without myelopathy or radiculopathy, lumbosacral region: Secondary | ICD-10-CM | POA: Diagnosis not present

## 2020-08-24 DIAGNOSIS — Z7901 Long term (current) use of anticoagulants: Secondary | ICD-10-CM | POA: Diagnosis not present

## 2020-08-24 DIAGNOSIS — M47818 Spondylosis without myelopathy or radiculopathy, sacral and sacrococcygeal region: Secondary | ICD-10-CM | POA: Diagnosis not present

## 2020-08-24 DIAGNOSIS — Z8546 Personal history of malignant neoplasm of prostate: Secondary | ICD-10-CM | POA: Diagnosis not present

## 2020-08-30 ENCOUNTER — Other Ambulatory Visit: Payer: Self-pay | Admitting: Cardiology

## 2020-08-30 DIAGNOSIS — I10 Essential (primary) hypertension: Secondary | ICD-10-CM

## 2020-09-02 DIAGNOSIS — S72002D Fracture of unspecified part of neck of left femur, subsequent encounter for closed fracture with routine healing: Secondary | ICD-10-CM | POA: Diagnosis not present

## 2020-09-02 DIAGNOSIS — G20 Parkinson's disease: Secondary | ICD-10-CM | POA: Diagnosis not present

## 2020-09-02 DIAGNOSIS — S72142D Displaced intertrochanteric fracture of left femur, subsequent encounter for closed fracture with routine healing: Secondary | ICD-10-CM | POA: Diagnosis not present

## 2020-09-02 DIAGNOSIS — I2510 Atherosclerotic heart disease of native coronary artery without angina pectoris: Secondary | ICD-10-CM | POA: Diagnosis not present

## 2020-09-02 DIAGNOSIS — N1831 Chronic kidney disease, stage 3a: Secondary | ICD-10-CM | POA: Diagnosis not present

## 2020-09-02 DIAGNOSIS — I1310 Hypertensive heart and chronic kidney disease without heart failure, with stage 1 through stage 4 chronic kidney disease, or unspecified chronic kidney disease: Secondary | ICD-10-CM | POA: Diagnosis not present

## 2020-09-03 DIAGNOSIS — S72002D Fracture of unspecified part of neck of left femur, subsequent encounter for closed fracture with routine healing: Secondary | ICD-10-CM | POA: Diagnosis not present

## 2020-09-03 DIAGNOSIS — G20 Parkinson's disease: Secondary | ICD-10-CM | POA: Diagnosis not present

## 2020-09-03 DIAGNOSIS — I2510 Atherosclerotic heart disease of native coronary artery without angina pectoris: Secondary | ICD-10-CM | POA: Diagnosis not present

## 2020-09-03 DIAGNOSIS — S72142D Displaced intertrochanteric fracture of left femur, subsequent encounter for closed fracture with routine healing: Secondary | ICD-10-CM | POA: Diagnosis not present

## 2020-09-03 DIAGNOSIS — I1310 Hypertensive heart and chronic kidney disease without heart failure, with stage 1 through stage 4 chronic kidney disease, or unspecified chronic kidney disease: Secondary | ICD-10-CM | POA: Diagnosis not present

## 2020-09-03 DIAGNOSIS — N1831 Chronic kidney disease, stage 3a: Secondary | ICD-10-CM | POA: Diagnosis not present

## 2020-09-07 DIAGNOSIS — I1310 Hypertensive heart and chronic kidney disease without heart failure, with stage 1 through stage 4 chronic kidney disease, or unspecified chronic kidney disease: Secondary | ICD-10-CM | POA: Diagnosis not present

## 2020-09-07 DIAGNOSIS — S72142D Displaced intertrochanteric fracture of left femur, subsequent encounter for closed fracture with routine healing: Secondary | ICD-10-CM | POA: Diagnosis not present

## 2020-09-07 DIAGNOSIS — I2510 Atherosclerotic heart disease of native coronary artery without angina pectoris: Secondary | ICD-10-CM | POA: Diagnosis not present

## 2020-09-07 DIAGNOSIS — S72002D Fracture of unspecified part of neck of left femur, subsequent encounter for closed fracture with routine healing: Secondary | ICD-10-CM | POA: Diagnosis not present

## 2020-09-07 DIAGNOSIS — G20 Parkinson's disease: Secondary | ICD-10-CM | POA: Diagnosis not present

## 2020-09-07 DIAGNOSIS — N1831 Chronic kidney disease, stage 3a: Secondary | ICD-10-CM | POA: Diagnosis not present

## 2020-09-09 ENCOUNTER — Other Ambulatory Visit: Payer: Self-pay

## 2020-09-09 MED ORDER — APIXABAN 2.5 MG PO TABS: 2.5000 mg | ORAL_TABLET | Freq: Two times a day (BID) | ORAL | 1 refills | Status: DC

## 2020-09-15 DIAGNOSIS — H353131 Nonexudative age-related macular degeneration, bilateral, early dry stage: Secondary | ICD-10-CM | POA: Diagnosis not present

## 2020-09-16 DIAGNOSIS — N1831 Chronic kidney disease, stage 3a: Secondary | ICD-10-CM | POA: Diagnosis not present

## 2020-09-16 DIAGNOSIS — I1310 Hypertensive heart and chronic kidney disease without heart failure, with stage 1 through stage 4 chronic kidney disease, or unspecified chronic kidney disease: Secondary | ICD-10-CM | POA: Diagnosis not present

## 2020-09-16 DIAGNOSIS — S72142D Displaced intertrochanteric fracture of left femur, subsequent encounter for closed fracture with routine healing: Secondary | ICD-10-CM | POA: Diagnosis not present

## 2020-09-16 DIAGNOSIS — S72002D Fracture of unspecified part of neck of left femur, subsequent encounter for closed fracture with routine healing: Secondary | ICD-10-CM | POA: Diagnosis not present

## 2020-09-16 DIAGNOSIS — G20 Parkinson's disease: Secondary | ICD-10-CM | POA: Diagnosis not present

## 2020-09-16 DIAGNOSIS — I2510 Atherosclerotic heart disease of native coronary artery without angina pectoris: Secondary | ICD-10-CM | POA: Diagnosis not present

## 2020-09-22 DIAGNOSIS — Z85820 Personal history of malignant melanoma of skin: Secondary | ICD-10-CM | POA: Diagnosis not present

## 2020-09-22 DIAGNOSIS — D485 Neoplasm of uncertain behavior of skin: Secondary | ICD-10-CM | POA: Diagnosis not present

## 2020-09-22 DIAGNOSIS — D225 Melanocytic nevi of trunk: Secondary | ICD-10-CM | POA: Diagnosis not present

## 2020-09-22 DIAGNOSIS — C44529 Squamous cell carcinoma of skin of other part of trunk: Secondary | ICD-10-CM | POA: Diagnosis not present

## 2020-09-22 DIAGNOSIS — L570 Actinic keratosis: Secondary | ICD-10-CM | POA: Diagnosis not present

## 2020-09-22 DIAGNOSIS — Z85828 Personal history of other malignant neoplasm of skin: Secondary | ICD-10-CM | POA: Diagnosis not present

## 2020-10-04 ENCOUNTER — Other Ambulatory Visit: Payer: Self-pay | Admitting: Neurology

## 2020-10-12 ENCOUNTER — Other Ambulatory Visit: Payer: Self-pay | Admitting: Cardiology

## 2020-10-12 DIAGNOSIS — I10 Essential (primary) hypertension: Secondary | ICD-10-CM | POA: Diagnosis not present

## 2020-10-12 NOTE — Addendum Note (Signed)
Addended by: [name] on: 10/12/2020 02:56 PM   Modules accepted: Orders

## 2020-10-13 LAB — BASIC METABOLIC PANEL
BUN/Creatinine Ratio: 20 (ref 10–24)
BUN: 31 mg/dL — ABNORMAL HIGH (ref 8–27)
CO2: 31 mmol/L — ABNORMAL HIGH (ref 20–29)
Calcium: 9.5 mg/dL (ref 8.6–10.2)
Chloride: 87 mmol/L — ABNORMAL LOW (ref 96–106)
Creatinine, Ser: 1.55 mg/dL — ABNORMAL HIGH (ref 0.76–1.27)
Glucose: 91 mg/dL (ref 65–99)
Potassium: 3.7 mmol/L (ref 3.5–5.2)
Sodium: 139 mmol/L (ref 134–144)
eGFR: 44 mL/min/{1.73_m2} — ABNORMAL LOW (ref >59)

## 2020-10-22 ENCOUNTER — Other Ambulatory Visit: Payer: Self-pay | Admitting: Cardiology

## 2020-10-22 NOTE — Telephone Encounter (Signed)
Remind me tomorrow at the time of the visit.  Thanks MJP

## 2020-10-23 ENCOUNTER — Encounter: Payer: Self-pay | Admitting: Cardiology

## 2020-10-23 ENCOUNTER — Other Ambulatory Visit: Payer: Self-pay

## 2020-10-23 ENCOUNTER — Ambulatory Visit: Payer: Medicare Other | Admitting: Cardiology

## 2020-10-23 VITALS — BP 129/57 | HR 78 | Temp 98.0°F | Resp 17 | Ht 70.0 in | Wt 194.0 lb

## 2020-10-23 DIAGNOSIS — I4821 Permanent atrial fibrillation: Secondary | ICD-10-CM

## 2020-10-23 DIAGNOSIS — I5032 Chronic diastolic (congestive) heart failure: Secondary | ICD-10-CM

## 2020-10-23 DIAGNOSIS — I272 Pulmonary hypertension, unspecified: Secondary | ICD-10-CM

## 2020-10-23 DIAGNOSIS — I10 Essential (primary) hypertension: Secondary | ICD-10-CM | POA: Diagnosis not present

## 2020-10-23 MED ORDER — METOLAZONE 2.5 MG PO TABS: 2.5000 mg | ORAL_TABLET | ORAL | 3 refills | Status: DC

## 2020-10-23 NOTE — Progress Notes (Signed)
c    Patient is here for follow up visit. Subjective:   '@Patient'  ID: [name], male    DOB: 11-May-1935, 85 y.o.   MRN: [id number]   Chief Complaint  Patient presents with   Chronic heart failure with preserved ejection fraction     HPI  85 year old Caucasian male with coronary artery disease status (LAD PCI 2018), permanent atrial fibrillation, complex valvular heart disease, mod AI, mod TR, moderate pulmonary hypertension, venous insufficiency, Parkinsons disease, hemorrhoidal GI bleed in 08/2017.  Patient has had recent increase in leg swelling, but improved with increasing metolazone to 2.5 mg twice a week. He performs stationary bicycle exercise 5 days a week with mild exertional dyspnea.    Current Outpatient Medications on File Prior to Visit  Medication Sig Dispense Refill   acetaminophen (TYLENOL) 325 MG tablet Take 1-2 tablets (325-650 mg total) by mouth every 6 (six) hours as needed for mild pain (pain score 1-3 or temp > 100.5). 100 tablet 0   apixaban (ELIQUIS) 2.5 MG TABS tablet Take 1 tablet (2.5 mg total) by mouth 2 (two) times daily. (0800 & 2000) 180 tablet 1   carbidopa-levodopa (SINEMET IR) 25-100 MG tablet TAKE 2 TABLETS BY MOUTH 3 (THREE) TIMES DAILY. 180 tablet 0   Cholecalciferol (VITAMIN D-3) 1000 units CAPS Take 1,000 Units by mouth daily. (0800)     dapagliflozin propanediol (FARXIGA) 10 MG TABS tablet Take 1 tablet by mouth daily.     diltiazem (CARDIZEM CD) 120 MG 24 hr capsule TAKE 1 CAPSULE EVERY DAY 90 capsule 3   docusate sodium (COLACE) 100 MG capsule Take 1 capsule (100 mg total) by mouth 2 (two) times daily. 10 capsule 0   furosemide (LASIX) 40 MG tablet Take 1 tablet (40 mg total) by mouth daily. (Patient taking differently: Take 80 mg by mouth in the morning, at noon, and at bedtime.) 1 tablet 0   gabapentin (NEURONTIN) 300 MG capsule Take 1 capsule (300 mg total) by mouth 3 (three) times daily. 270 capsule 1   metolazone (ZAROXOLYN)  2.5 MG tablet Take 2.5 mg by mouth daily.     Multiple Vitamins-Minerals (PRESERVISION AREDS PO) Take 1 tablet by mouth daily.     nitroGLYCERIN (NITROSTAT) 0.4 MG SL tablet TAKE 1 TABLET EVERY 5 MINUTES AS NEEDED FOR CHEST PAIN 25 tablet 0   polyethylene glycol (MIRALAX / GLYCOLAX) packet Take 17 g by mouth 2 (two) times daily. (Patient taking differently: Take 17 g by mouth as needed for mild constipation.) 14 each 0   potassium chloride (KLOR-CON) 10 MEQ tablet Take 2 tablets (20 mEq total) by mouth 2 (two) times daily. 1 tablet 0   rosuvastatin (CRESTOR) 10 MG tablet Take 10 mg by mouth daily at 8 pm. (2000)     senna (SENOKOT) 8.6 MG TABS tablet Take 1 tablet (8.6 mg total) by mouth 2 (two) times daily. 120 tablet 0   No current facility-administered medications on file prior to visit.    Cardiovascular studies:  EKG 06/17/2020: Atrial fibrillation 79 bpm Right bundle branch block  RHC 03/20/2018: WHO Grp II pulmonary hypertension Mean PA 42 mmHg, PVR 2.4 WU Normal RV systolic function (PAPi 2.4) Recommendation: Continue medical management with aggressive diuresis.  Echocardiogram 01/02/2018: Left ventricle cavity is normal in size. Moderate concentric hypertrophy of the left ventricle. Low normal global wall motion. Visual EF is 50-55%. Unable to evaluate diastolic function due to A. Fibrillation. Calculated EF 51%. Left atrial cavity is severely dilated. Right  atrial cavity is moderately dilated. Trileaflet aortic valve with moderate aortic valve leaflet calcification. Trace aortic stenosis. Moderate (Grade II) aortic regurgitation. Moderate (Grade II) mitral regurgitation. Moderate tricuspid regurgitation. Mild pulmonary hypertension. Estimated pulmonary artery systolic pressure 65 mmHg. Compared to previous echocardiogram on 09/01/2016, there is interval increase in severity of aortic regurgitation, tricuspid regurgitation, and pulmonary hypertension.  Event monitor  04/27/2017 - 05/26/2017: Persistent atrial fibrillation with controlled ventricular rate.   Two episodes of 3 beat nonsustained VT 4.6 second pause at 11:34 PM on 05/03/2017.   3.9 second pause at 7 AM on 05/14/2017.  Symptoms not reported. Occasional episodes of atypical atrial flutter with variable conduction  Lower extremity venous duplex 04/07/2017: No evidence of deep vein thrombosis of the right lower extremity with normal venous return. No evidence of valvular incompetence (chronic venous insufficiency) of the right lower extremity.  No evidence of deep vein thrombosis of the left lower extremity with normal venous return. Chronic valvular incompetence (chronic venous insufficiency) of the left lower extremity. Marked reflux noted in the left sapheno-femoral junction (2.5 s), great saphenous proximal thigh (2.6 s), great saphenous mid thigh (1.6 s), great saphenous distal thigh (3.9 s) and great saphenous proximal calf (3.4 s) veins.  Cardioversion 01/26/2017: S/p successful TEE guided cardioversion 100 JS/p successful TEE guided cardioversion 100 J  Coronary angiogram 08/09/2016: FFR guided CSI atherectomy followed by 3.0 x 26 and 3.0 x 18 mm onyx resolute DES to Mid LAD. Mild disease in Cx and RCA.  Lower Extremity Dopplers 06/2009: Pt has Baker Cyst behind both knees.   Recent labs: 10/12/2020: Glucose 91, BUN/Cr 31/1.55. EGFR 44. Na/K 139/3.7.   07/14/2020: Glucose 81, BUN/Cr 27/1.59. EGFR 43. Na/K 142/3.8  10/04/2018: Glucose 91, BUN/Cr 21/1.49. EGFR 43.  HbA1C 5.4% Chol 143, TG 63, HDL 65, LDL 65 TSH 2.8 normal    Review of Systems  Eyes:  Negative for visual disturbance.  Cardiovascular:  Positive for dyspnea on exertion (Mild, stable) and leg swelling (Significantly improved). Negative for chest pain, palpitations and syncope.  Respiratory:  Negative for shortness of breath.   Musculoskeletal:  Positive for joint pain (Bilateral knees).  All other systems reviewed and  are negative.     Objective:    Vitals:   10/23/20 1043  BP: (!) 129/57  Pulse: 78  Resp: 17  Temp: 98 F (36.7 C)  SpO2: 96%   Filed Weights   10/23/20 1043  Weight: 194 lb (88 kg)      Physical Exam Vitals and nursing note reviewed.  Constitutional:      General: He is not in acute distress. Neck:     Vascular: No JVD.  Cardiovascular:     Rate and Rhythm: Normal rate. Rhythm irregularly irregular.     Pulses: Normal pulses and intact distal pulses.     Heart sounds: Murmur heard.  Midsystolic murmur is present with a grade of 3/6. Aortic Area    No gallop. No S3 or S4 sounds.     Comments: S1 is variable, S2 is normal. Pulmonary:     Effort: Pulmonary effort is normal.     Breath sounds: Normal breath sounds. No wheezing or rales.  Musculoskeletal:        General: Normal range of motion.     Cervical back: Neck supple.     Right lower leg: Edema (1+, baseline) present.     Left lower leg: Edema (1+, baseline) present.  Skin:    General: Skin is warm and  dry.        Assessment & Recommendations:   85 year old Caucasian male with coronary artery disease status (LAD PCI 2018), now permanentl atrial fibrillation, complex valvular heart disease, mod AI, mod TR, moderate pulmonary hypertension, venous insufficiency, Parkinsons disease, hemorrhoidal GI bleed in 08/2017.  Leg edema: Combination of HFpEF, PH and venous insufficiency. He is not interested in any endovenous therapies. Continue Lasix 80 mg tid, metolazone 2.5 mg twice a week  Permanent atrial fibrilation: Rate controlled. CHA2DS2VASc score 3: Annual stroke risk 3.2% Continue Eliquis 2.5 mg bid.  Continue diltiazem 120 mg for rate control.   Valvular disease: Mod aortic, motral, tricuspid regurg, likely all due to atrial fibrilation. Rate control for Afib, as above.  Pulmonary hypertension: WHO Grp II pulmonary hypertension due to left sided heart diseease (HFpEF, valvular heart disease, Afib-  RHC 02/2018 showed PCWP 27 mmHg). Recommend continued management of hypertension and diuresis.  Given that we have opted for conservative management for the above cardiac issues, I do not think repeat echocardiogram would necessarily change the management.   H/o GI bleed: S/p hemorrhoidectomy in 08/2017.Currently, no bleeding with stable Hb. Tolerating Eliquis well.  CAD: Stable. Not on aspirin due to bleeding risk. Continue statin. Given his advanced age, renal dysfunction, recommend conservative medical management  F/u 6 months  Nakeem Murnane Esther Hardy, MD [facility] Pager: [phone] Office: [phone] If no answer Cell [phone]

## 2020-10-26 ENCOUNTER — Other Ambulatory Visit: Payer: Self-pay

## 2020-10-26 ENCOUNTER — Telehealth: Payer: Self-pay | Admitting: Cardiology

## 2020-10-26 DIAGNOSIS — I5032 Chronic diastolic (congestive) heart failure: Secondary | ICD-10-CM

## 2020-10-26 DIAGNOSIS — I10 Essential (primary) hypertension: Secondary | ICD-10-CM

## 2020-10-26 MED ORDER — POTASSIUM CHLORIDE ER 10 MEQ PO TBCR: 20.0000 meq | EXTENDED_RELEASE_TABLET | Freq: Two times a day (BID) | ORAL | 3 refills | Status: DC

## 2020-10-26 MED ORDER — APIXABAN 2.5 MG PO TABS: 2.5000 mg | ORAL_TABLET | Freq: Two times a day (BID) | ORAL | 1 refills | Status: DC

## 2020-10-26 MED ORDER — FUROSEMIDE 40 MG PO TABS: 40.0000 mg | ORAL_TABLET | Freq: Every day | ORAL | 0 refills | Status: DC

## 2020-10-26 MED ORDER — DILTIAZEM HCL ER COATED BEADS 120 MG PO CP24: 120.0000 mg | ORAL_CAPSULE | Freq: Every day | ORAL | 0 refills | Status: DC

## 2020-10-26 MED ORDER — METOLAZONE 2.5 MG PO TABS: 2.5000 mg | ORAL_TABLET | ORAL | 0 refills | Status: DC

## 2020-10-26 NOTE — Telephone Encounter (Signed)
Done

## 2020-10-26 NOTE — Telephone Encounter (Signed)
Pt requesting refills for medications

## 2020-11-04 NOTE — Progress Notes (Signed)
Assessment/Plan:   1.  Parkinsons Disease  -Continue carbidopa/levodopa 25/100, 2 tablets 3 times per day.  Can take extra 1/2 to 1 prn.  -Ambulate with walker at all times.  -Follows with Dr. Ronnald Ramp for dermatology.  We discussed that it used to be thought that levodopa would increase risk of melanoma but now it is believed that Parkinsons itself likely increases risk of melanoma.     2.  Renal insufficiency  -Follows with nephrology  3.  Atrial fibrillation  -On Eliquis  -Follows with cardiology  4.  Feet pain paresthesias   -Continue gabapentin, 300 mg 3 times daily.   Subjective:   Evans Lance was seen today in follow up for Parkinsons disease.  My previous records were reviewed prior to todays visit as well as outside records available to me.  Unfortunately, the patient fell since last visit and fractured his hip (was reaching down to get ice and fell).  This was in March, right after our last visit.  Records reviewed.  This did require surgery.  It was recommended that he have physical therapy at the subacute nursing facility after this, but hospital records indicate that patient/spouse did not wish to go to SNF and he was discharged home.  States did home rehab.  States that he is just now getting around to moving.  Having knee pain (was before that as well).  Having swelling in the legs.  Patient last saw cardiology August 26.  Notes are reviewed.  Current prescribed movement disorder medications: Carbidopa/levodopa 25/100, 2 tablets at 7 AM/11 AM/4 PM Gabapentin, 300 mg 3 times per day (increased last visit)  PREVIOUS MEDICATIONS: Gabapentin for peripheral neuropathy  ALLERGIES:   Allergies  Allergen Reactions   Nsaids Other (See Comments)    Cannot have any of these because he is currently taking Eliquis    CURRENT MEDICATIONS:  Outpatient Encounter Medications as of 11/05/2020  Medication Sig   acetaminophen (TYLENOL) 325 MG tablet Take 1-2 tablets  (325-650 mg total) by mouth every 6 (six) hours as needed for mild pain (pain score 1-3 or temp > 100.5).   apixaban (ELIQUIS) 2.5 MG TABS tablet Take 1 tablet (2.5 mg total) by mouth 2 (two) times daily. (0800 & 2000)   carbidopa-levodopa (SINEMET IR) 25-100 MG tablet TAKE 2 TABLETS BY MOUTH 3 (THREE) TIMES DAILY.   Cholecalciferol (VITAMIN D-3) 1000 units CAPS Take 1,000 Units by mouth daily. (0800)   dapagliflozin propanediol (FARXIGA) 10 MG TABS tablet Take 1 tablet by mouth daily.   diltiazem (CARDIZEM CD) 120 MG 24 hr capsule Take 1 capsule (120 mg total) by mouth daily.   furosemide (LASIX) 40 MG tablet Take 1 tablet (40 mg total) by mouth daily.   gabapentin (NEURONTIN) 300 MG capsule Take 1 capsule (300 mg total) by mouth 3 (three) times daily.   metolazone (ZAROXOLYN) 2.5 MG tablet Take 1 tablet (2.5 mg total) by mouth as directed. Twice a week   Multiple Vitamins-Minerals (PRESERVISION AREDS PO) Take 1 tablet by mouth daily.   nitroGLYCERIN (NITROSTAT) 0.4 MG SL tablet TAKE 1 TABLET EVERY 5 MINUTES AS NEEDED FOR CHEST PAIN   polyethylene glycol (MIRALAX / GLYCOLAX) packet Take 17 g by mouth 2 (two) times daily. (Patient taking differently: Take 17 g by mouth as needed for mild constipation.)   potassium chloride (KLOR-CON) 10 MEQ tablet Take 2 tablets (20 mEq total) by mouth 2 (two) times daily.   rosuvastatin (CRESTOR) 10 MG tablet Take 10  mg by mouth daily at 8 pm. (2000)   No facility-administered encounter medications on file as of 11/05/2020.    Objective:   PHYSICAL EXAMINATION:    VITALS:   Vitals:   11/05/20 1456  BP: 135/78  Pulse: 74  SpO2: 96%  Weight: 194 lb 6.4 oz (88.2 kg)  Height: '5\' 11"'$  (1.803 m)     GEN:  The patient appears stated age and is in NAD. HEENT:  Normocephalic, atraumatic.  The mucous membranes are moist. The superficial temporal arteries are without ropiness or tenderness. CV:  RRR Lungs:  CTAB Neck/HEME:  There are no carotid bruits  bilaterally.  Neurological examination:  Orientation: The patient is alert and oriented x3. Cranial nerves: There is good facial symmetry with facial hypomimia. The speech is fluent and clear. Soft palate rises symmetrically and there is no tongue deviation. Hearing is intact to conversational tone. Sensation: Sensation is intact to light touch throughout Motor: Strength is at least antigravity x4.  Movement examination: Tone: There is mild to mod increased tone in the LUE Abnormal movements: Mild head dyskinesia (same as last visit) Coordination:  There is good RAMs today Gait and Station: The patient has no difficulty arising out of a deep-seated chair without the use of the hands. The patient's stride length is good with the walker.  He is flexed at the knees but ambulates well  I have reviewed and interpreted the following labs independently    Chemistry      Component Value Date/Time   NA 139 10/12/2020 1522   K 3.7 10/12/2020 1522   CL 87 (L) 10/12/2020 1522   CO2 31 (H) 10/12/2020 1522   BUN 31 (H) 10/12/2020 1522   CREATININE 1.55 (H) 10/12/2020 1522      Component Value Date/Time   CALCIUM 9.5 10/12/2020 1522   ALKPHOS 103 05/24/2020 0859   AST 32 05/24/2020 0859   ALT 10 05/24/2020 0859   BILITOT 5.1 (H) 05/24/2020 0859       Lab Results  Component Value Date   WBC 6.3 05/24/2020   HGB 11.6 (L) 05/24/2020   HCT 36.9 (L) 05/24/2020   MCV 94.4 05/24/2020   PLT 172 05/24/2020    Lab Results  Component Value Date   TSH 2.94 09/15/2016     Total time spent on today's visit was 30 minutes, including both face-to-face time and nonface-to-face time.  Time included that spent on review of records (prior notes available to me/labs/imaging if pertinent), discussing treatment and goals, answering patient's questions and coordinating care.  Cc:  Carol Ada, MD

## 2020-11-05 ENCOUNTER — Encounter: Payer: Self-pay | Admitting: Neurology

## 2020-11-05 ENCOUNTER — Ambulatory Visit (INDEPENDENT_AMBULATORY_CARE_PROVIDER_SITE_OTHER): Payer: Medicare Other | Admitting: Neurology

## 2020-11-05 ENCOUNTER — Other Ambulatory Visit: Payer: Self-pay

## 2020-11-05 VITALS — BP 135/78 | HR 74 | Ht 71.0 in | Wt 194.4 lb

## 2020-11-05 DIAGNOSIS — G249 Dystonia, unspecified: Secondary | ICD-10-CM | POA: Diagnosis not present

## 2020-11-05 DIAGNOSIS — G20 Parkinson's disease: Secondary | ICD-10-CM | POA: Diagnosis not present

## 2020-11-05 DIAGNOSIS — G5793 Unspecified mononeuropathy of bilateral lower limbs: Secondary | ICD-10-CM | POA: Diagnosis not present

## 2020-11-05 NOTE — Patient Instructions (Signed)
Take carbidopa/levodopa 25/100, 2 tablets three times per day.  Can take an extra 1/2 to 1 tablet if needed.  The physicians and staff at [facility] are committed to providing excellent care. You may receive a survey requesting feedback about your experience at our office. We strive to receive "very good" responses to the survey questions. If you feel that your experience would prevent you from giving the office a "very good " response, please contact our office to try to remedy the situation. We may be reached at [phone]. Thank you for taking the time out of your busy day to complete the survey.

## 2020-11-12 ENCOUNTER — Telehealth: Payer: Self-pay | Admitting: Cardiology

## 2020-11-12 ENCOUNTER — Other Ambulatory Visit: Payer: Self-pay

## 2020-11-12 DIAGNOSIS — I5032 Chronic diastolic (congestive) heart failure: Secondary | ICD-10-CM

## 2020-11-12 MED ORDER — METOLAZONE 2.5 MG PO TABS: 2.5000 mg | ORAL_TABLET | ORAL | 0 refills | Status: DC

## 2020-11-12 NOTE — Telephone Encounter (Signed)
Refill sent.

## 2020-11-12 NOTE — Telephone Encounter (Signed)
Patient's girlfriend calling to request refill for metolazone 2.5 mg; asking for it to be sent through [facility] instead of [facility].

## 2020-11-17 DIAGNOSIS — M170 Bilateral primary osteoarthritis of knee: Secondary | ICD-10-CM | POA: Diagnosis not present

## 2020-11-17 DIAGNOSIS — S72142D Displaced intertrochanteric fracture of left femur, subsequent encounter for closed fracture with routine healing: Secondary | ICD-10-CM | POA: Diagnosis not present

## 2020-11-23 DIAGNOSIS — N183 Chronic kidney disease, stage 3 unspecified: Secondary | ICD-10-CM | POA: Diagnosis not present

## 2020-11-27 ENCOUNTER — Other Ambulatory Visit: Payer: Self-pay | Admitting: Neurology

## 2020-11-27 DIAGNOSIS — G20 Parkinson's disease: Secondary | ICD-10-CM

## 2020-12-03 DIAGNOSIS — E876 Hypokalemia: Secondary | ICD-10-CM | POA: Diagnosis not present

## 2020-12-03 DIAGNOSIS — I50810 Right heart failure, unspecified: Secondary | ICD-10-CM | POA: Diagnosis not present

## 2020-12-03 DIAGNOSIS — Z23 Encounter for immunization: Secondary | ICD-10-CM | POA: Diagnosis not present

## 2020-12-03 DIAGNOSIS — I129 Hypertensive chronic kidney disease with stage 1 through stage 4 chronic kidney disease, or unspecified chronic kidney disease: Secondary | ICD-10-CM | POA: Diagnosis not present

## 2020-12-03 DIAGNOSIS — N183 Chronic kidney disease, stage 3 unspecified: Secondary | ICD-10-CM | POA: Diagnosis not present

## 2020-12-06 ENCOUNTER — Other Ambulatory Visit: Payer: Self-pay | Admitting: Cardiology

## 2020-12-10 DIAGNOSIS — I8312 Varicose veins of left lower extremity with inflammation: Secondary | ICD-10-CM | POA: Diagnosis not present

## 2020-12-10 DIAGNOSIS — C44729 Squamous cell carcinoma of skin of left lower limb, including hip: Secondary | ICD-10-CM | POA: Diagnosis not present

## 2020-12-10 DIAGNOSIS — Z85828 Personal history of other malignant neoplasm of skin: Secondary | ICD-10-CM | POA: Diagnosis not present

## 2020-12-10 DIAGNOSIS — C44622 Squamous cell carcinoma of skin of right upper limb, including shoulder: Secondary | ICD-10-CM | POA: Diagnosis not present

## 2020-12-10 DIAGNOSIS — C44529 Squamous cell carcinoma of skin of other part of trunk: Secondary | ICD-10-CM | POA: Diagnosis not present

## 2020-12-10 DIAGNOSIS — L570 Actinic keratosis: Secondary | ICD-10-CM | POA: Diagnosis not present

## 2020-12-10 DIAGNOSIS — I872 Venous insufficiency (chronic) (peripheral): Secondary | ICD-10-CM | POA: Diagnosis not present

## 2020-12-10 DIAGNOSIS — D485 Neoplasm of uncertain behavior of skin: Secondary | ICD-10-CM | POA: Diagnosis not present

## 2020-12-10 DIAGNOSIS — C44629 Squamous cell carcinoma of skin of left upper limb, including shoulder: Secondary | ICD-10-CM | POA: Diagnosis not present

## 2020-12-10 DIAGNOSIS — L308 Other specified dermatitis: Secondary | ICD-10-CM | POA: Diagnosis not present

## 2020-12-10 DIAGNOSIS — Z85820 Personal history of malignant melanoma of skin: Secondary | ICD-10-CM | POA: Diagnosis not present

## 2020-12-10 DIAGNOSIS — L821 Other seborrheic keratosis: Secondary | ICD-10-CM | POA: Diagnosis not present

## 2020-12-10 DIAGNOSIS — I8311 Varicose veins of right lower extremity with inflammation: Secondary | ICD-10-CM | POA: Diagnosis not present

## 2020-12-25 DIAGNOSIS — Z23 Encounter for immunization: Secondary | ICD-10-CM | POA: Diagnosis not present

## 2021-01-04 DIAGNOSIS — I482 Chronic atrial fibrillation, unspecified: Secondary | ICD-10-CM | POA: Diagnosis not present

## 2021-01-04 DIAGNOSIS — E782 Mixed hyperlipidemia: Secondary | ICD-10-CM | POA: Diagnosis not present

## 2021-01-04 DIAGNOSIS — S81801A Unspecified open wound, right lower leg, initial encounter: Secondary | ICD-10-CM | POA: Diagnosis not present

## 2021-01-04 DIAGNOSIS — I10 Essential (primary) hypertension: Secondary | ICD-10-CM | POA: Diagnosis not present

## 2021-01-04 DIAGNOSIS — N189 Chronic kidney disease, unspecified: Secondary | ICD-10-CM | POA: Diagnosis not present

## 2021-01-04 DIAGNOSIS — G629 Polyneuropathy, unspecified: Secondary | ICD-10-CM | POA: Diagnosis not present

## 2021-01-04 DIAGNOSIS — G20 Parkinson's disease: Secondary | ICD-10-CM | POA: Diagnosis not present

## 2021-01-04 DIAGNOSIS — Z23 Encounter for immunization: Secondary | ICD-10-CM | POA: Diagnosis not present

## 2021-01-05 DIAGNOSIS — M170 Bilateral primary osteoarthritis of knee: Secondary | ICD-10-CM | POA: Diagnosis not present

## 2021-01-26 ENCOUNTER — Other Ambulatory Visit: Payer: Self-pay

## 2021-01-26 MED ORDER — APIXABAN 2.5 MG PO TABS
2.5000 mg | ORAL_TABLET | Freq: Two times a day (BID) | ORAL | 5 refills | Status: DC
Start: 2021-01-26 — End: 2022-11-17

## 2021-01-26 MED ORDER — APIXABAN 2.5 MG PO TABS
2.5000 mg | ORAL_TABLET | Freq: Two times a day (BID) | ORAL | 1 refills | Status: DC
Start: 2021-01-26 — End: 2021-01-26

## 2021-01-27 DIAGNOSIS — H35362 Drusen (degenerative) of macula, left eye: Secondary | ICD-10-CM | POA: Diagnosis not present

## 2021-02-01 DIAGNOSIS — M170 Bilateral primary osteoarthritis of knee: Secondary | ICD-10-CM | POA: Diagnosis not present

## 2021-02-01 DIAGNOSIS — M1712 Unilateral primary osteoarthritis, left knee: Secondary | ICD-10-CM | POA: Diagnosis not present

## 2021-02-01 DIAGNOSIS — M1711 Unilateral primary osteoarthritis, right knee: Secondary | ICD-10-CM | POA: Diagnosis not present

## 2021-02-08 DIAGNOSIS — M1712 Unilateral primary osteoarthritis, left knee: Secondary | ICD-10-CM | POA: Diagnosis not present

## 2021-02-08 DIAGNOSIS — M1711 Unilateral primary osteoarthritis, right knee: Secondary | ICD-10-CM | POA: Diagnosis not present

## 2021-02-08 DIAGNOSIS — M170 Bilateral primary osteoarthritis of knee: Secondary | ICD-10-CM | POA: Diagnosis not present

## 2021-02-09 DIAGNOSIS — L821 Other seborrheic keratosis: Secondary | ICD-10-CM | POA: Diagnosis not present

## 2021-02-09 DIAGNOSIS — Z85820 Personal history of malignant melanoma of skin: Secondary | ICD-10-CM | POA: Diagnosis not present

## 2021-02-09 DIAGNOSIS — D225 Melanocytic nevi of trunk: Secondary | ICD-10-CM | POA: Diagnosis not present

## 2021-02-09 DIAGNOSIS — Z85828 Personal history of other malignant neoplasm of skin: Secondary | ICD-10-CM | POA: Diagnosis not present

## 2021-02-15 DIAGNOSIS — M1711 Unilateral primary osteoarthritis, right knee: Secondary | ICD-10-CM | POA: Diagnosis not present

## 2021-02-15 DIAGNOSIS — M170 Bilateral primary osteoarthritis of knee: Secondary | ICD-10-CM | POA: Diagnosis not present

## 2021-02-15 DIAGNOSIS — M1712 Unilateral primary osteoarthritis, left knee: Secondary | ICD-10-CM | POA: Diagnosis not present

## 2021-04-05 DIAGNOSIS — U071 COVID-19: Secondary | ICD-10-CM | POA: Diagnosis not present

## 2021-04-19 DIAGNOSIS — M170 Bilateral primary osteoarthritis of knee: Secondary | ICD-10-CM | POA: Diagnosis not present

## 2021-04-19 DIAGNOSIS — M7062 Trochanteric bursitis, left hip: Secondary | ICD-10-CM | POA: Diagnosis not present

## 2021-04-20 DIAGNOSIS — Z85828 Personal history of other malignant neoplasm of skin: Secondary | ICD-10-CM | POA: Diagnosis not present

## 2021-04-20 DIAGNOSIS — Z85820 Personal history of malignant melanoma of skin: Secondary | ICD-10-CM | POA: Diagnosis not present

## 2021-04-20 DIAGNOSIS — D0471 Carcinoma in situ of skin of right lower limb, including hip: Secondary | ICD-10-CM | POA: Diagnosis not present

## 2021-04-20 DIAGNOSIS — C44622 Squamous cell carcinoma of skin of right upper limb, including shoulder: Secondary | ICD-10-CM | POA: Diagnosis not present

## 2021-04-20 DIAGNOSIS — C44722 Squamous cell carcinoma of skin of right lower limb, including hip: Secondary | ICD-10-CM | POA: Diagnosis not present

## 2021-04-20 HISTORY — PX: MELANOMA EXCISION: SHX5266

## 2021-04-21 DIAGNOSIS — I5032 Chronic diastolic (congestive) heart failure: Secondary | ICD-10-CM | POA: Diagnosis not present

## 2021-04-22 ENCOUNTER — Other Ambulatory Visit: Payer: Self-pay | Admitting: Neurology

## 2021-04-22 DIAGNOSIS — G20 Parkinson's disease: Secondary | ICD-10-CM

## 2021-04-22 LAB — BASIC METABOLIC PANEL
BUN/Creatinine Ratio: 23 (ref 10–24)
BUN: 41 mg/dL — ABNORMAL HIGH (ref 8–27)
CO2: 30 mmol/L — ABNORMAL HIGH (ref 20–29)
Calcium: 9.8 mg/dL (ref 8.6–10.2)
Chloride: 84 mmol/L — ABNORMAL LOW (ref 96–106)
Creatinine, Ser: 1.76 mg/dL — ABNORMAL HIGH (ref 0.76–1.27)
Glucose: 91 mg/dL (ref 70–99)
Potassium: 3.4 mmol/L — ABNORMAL LOW (ref 3.5–5.2)
Sodium: 135 mmol/L (ref 134–144)
eGFR: 38 mL/min/{1.73_m2} — ABNORMAL LOW (ref >59)

## 2021-04-26 ENCOUNTER — Ambulatory Visit: Payer: Medicare Other | Admitting: Cardiology

## 2021-04-26 ENCOUNTER — Encounter: Payer: Self-pay | Admitting: Cardiology

## 2021-04-26 ENCOUNTER — Other Ambulatory Visit: Payer: Self-pay

## 2021-04-26 VITALS — BP 104/66 | HR 83 | Temp 98.4°F | Resp 17 | Ht 71.0 in | Wt 183.0 lb

## 2021-04-26 DIAGNOSIS — I5032 Chronic diastolic (congestive) heart failure: Secondary | ICD-10-CM

## 2021-04-26 DIAGNOSIS — I4821 Permanent atrial fibrillation: Secondary | ICD-10-CM | POA: Diagnosis not present

## 2021-04-26 DIAGNOSIS — I272 Pulmonary hypertension, unspecified: Secondary | ICD-10-CM

## 2021-04-26 DIAGNOSIS — I10 Essential (primary) hypertension: Secondary | ICD-10-CM | POA: Diagnosis not present

## 2021-04-26 MED ORDER — METOLAZONE 2.5 MG PO TABS: 2.5000 mg | ORAL_TABLET | ORAL | 0 refills | Status: DC | PRN

## 2021-04-26 NOTE — Progress Notes (Signed)
c    Patient is here for follow up visit. Subjective:   _0  ID: [name], male    DOB: 07/03/1935, 86 y.o.   MRN: [id number]   Chief Complaint  Patient presents with   HFpEF    26 month    HPI  86 year old Caucasian male with coronary artery disease status (LAD PCI 2018), permanent atrial fibrillation, complex valvular heart disease, mod AI, mod TR, moderate pulmonary hypertension, venous insufficiency, Parkinsons disease, CKD.  Patient is here with his significant other.  He has been urinating a lot, up to 17 times, on Lasix 80 mg daily, metolazone 2.5 mg twice weekly.  Leg swelling has significantly improved.  He would like to reduce metolazone.  He has had issues with balance.  As you know, patient significant other, corroborated something I noticed on today's visit, that he has significant involuntary movements, such as head movements.    Both [name] and Vaughan Basta had COVID few weeks ago, recovering from the same.  Exertional dyspnea is unchanged.  He is able to perform his stationary exercises regularly without significant difficulty.  Current Outpatient Medications on File Prior to Visit  Medication Sig Dispense Refill   acetaminophen (TYLENOL) 325 MG tablet Take 1-2 tablets (325-650 mg total) by mouth every 6 (six) hours as needed for mild pain (pain score 1-3 or temp > 100.5). 100 tablet 0   apixaban (ELIQUIS) 2.5 MG TABS tablet Take 1 tablet (2.5 mg total) by mouth 2 (two) times daily. (0800 & 2000) 180 tablet 5   carbidopa-levodopa (SINEMET IR) 25-100 MG tablet TAKE 2 TABLETS BY MOUTH THREE TIMES DAILY. 540 tablet 0   Cholecalciferol (VITAMIN D-3) 1000 units CAPS Take 1,000 Units by mouth daily. (0800)     dapagliflozin propanediol (FARXIGA) 10 MG TABS tablet Take 1 tablet by mouth daily.     diltiazem (CARDIZEM CD) 120 MG 24 hr capsule Take 1 capsule (120 mg total) by mouth daily. 90 capsule 0   furosemide (LASIX) 40 MG tablet Take 1 tablet (40 mg total) by mouth  daily. 90 tablet 0   furosemide (LASIX) 80 MG tablet TAKE 2 TABLETS EVERY MORNING AND TAKE 1 TABLET EVERY AFTERNOON AS DIRECTED 270 tablet 3   gabapentin (NEURONTIN) 300 MG capsule Take 1 capsule (300 mg total) by mouth 3 (three) times daily. 270 capsule 1   metolazone (ZAROXOLYN) 2.5 MG tablet Take 1 tablet (2.5 mg total) by mouth as directed. Twice a week 120 tablet 0   Multiple Vitamins-Minerals (PRESERVISION AREDS PO) Take 1 tablet by mouth daily.     nitroGLYCERIN (NITROSTAT) 0.4 MG SL tablet TAKE 1 TABLET EVERY 5 MINUTES AS NEEDED FOR CHEST PAIN 25 tablet 0   polyethylene glycol (MIRALAX / GLYCOLAX) packet Take 17 g by mouth 2 (two) times daily. (Patient taking differently: Take 17 g by mouth as needed for mild constipation.) 14 each 0   potassium chloride (KLOR-CON) 10 MEQ tablet Take 2 tablets (20 mEq total) by mouth 2 (two) times daily. 60 tablet 3   rosuvastatin (CRESTOR) 10 MG tablet Take 10 mg by mouth daily at 8 pm. (2000)     No current facility-administered medications on file prior to visit.    Cardiovascular studies:  EKG 04/26/2021: Atrial fibrillation 74 bpm RBBB  RHC 03/20/2018: WHO Grp II pulmonary hypertension Mean PA 42 mmHg, PVR 2.4 WU Normal RV systolic function (PAPi 2.4) Recommendation: Continue medical management with aggressive diuresis.  Echocardiogram 01/02/2018: Left ventricle cavity is normal in size.  Moderate concentric hypertrophy of the left ventricle. Low normal global wall motion. Visual EF is 50-55%. Unable to evaluate diastolic function due to A. Fibrillation. Calculated EF 51%. Left atrial cavity is severely dilated. Right atrial cavity is moderately dilated. Trileaflet aortic valve with moderate aortic valve leaflet calcification. Trace aortic stenosis. Moderate (Grade II) aortic regurgitation. Moderate (Grade II) mitral regurgitation. Moderate tricuspid regurgitation. Mild pulmonary hypertension. Estimated pulmonary artery systolic pressure 65  mmHg. Compared to previous echocardiogram on 09/01/2016, there is interval increase in severity of aortic regurgitation, tricuspid regurgitation, and pulmonary hypertension.  Event monitor 04/27/2017 - 05/26/2017: Persistent atrial fibrillation with controlled ventricular rate.   Two episodes of 3 beat nonsustained VT 4.6 second pause at 11:34 PM on 05/03/2017.   3.9 second pause at 7 AM on 05/14/2017.  Symptoms not reported. Occasional episodes of atypical atrial flutter with variable conduction  Lower extremity venous duplex 04/07/2017: No evidence of deep vein thrombosis of the right lower extremity with normal venous return. No evidence of valvular incompetence (chronic venous insufficiency) of the right lower extremity.  No evidence of deep vein thrombosis of the left lower extremity with normal venous return. Chronic valvular incompetence (chronic venous insufficiency) of the left lower extremity. Marked reflux noted in the left sapheno-femoral junction (2.5 s), great saphenous proximal thigh (2.6 s), great saphenous mid thigh (1.6 s), great saphenous distal thigh (3.9 s) and great saphenous proximal calf (3.4 s) veins.  Cardioversion 01/26/2017: S/p successful TEE guided cardioversion 100 JS/p successful TEE guided cardioversion 100 J  Coronary angiogram 08/09/2016: FFR guided CSI atherectomy followed by 3.0 x 26 and 3.0 x 18 mm onyx resolute DES to Mid LAD. Mild disease in Cx and RCA.  Lower Extremity Dopplers 06/2009: Pt has Baker Cyst behind both knees.   Recent labs: 04/21/2021: Glucose 91, BUN/Cr 41/1.76. EGFR 38. Na/K 135/3.4.   10/12/2020: Glucose 91, BUN/Cr 31/1.55. EGFR 44. Na/K 139/3.7.   07/14/2020: Glucose 81, BUN/Cr 27/1.59. EGFR 43. Na/K 142/3.8  10/04/2018: Glucose 91, BUN/Cr 21/1.49. EGFR 43.  HbA1C 5.4% Chol 143, TG 63, HDL 65, LDL 65 TSH 2.8 normal    Review of Systems  Eyes:  Negative for visual disturbance.  Cardiovascular:  Positive for leg swelling  (Significantly improved). Negative for chest pain, dyspnea on exertion (Mild, stable), palpitations and syncope.  Respiratory:  Negative for shortness of breath.   Musculoskeletal:  Positive for joint pain (Bilateral knees).  All other systems reviewed and are negative.     Objective:    Vitals:   04/26/21 1130  BP: 104/66  Pulse: 83  Resp: 17  Temp: 98.4 F (36.9 C)  SpO2: 98%   Filed Weights   04/26/21 1130  Weight: 183 lb (83 kg)      Physical Exam Vitals and nursing note reviewed.  Constitutional:      General: He is not in acute distress. Neck:     Vascular: No JVD.  Cardiovascular:     Rate and Rhythm: Normal rate. Rhythm irregularly irregular.     Pulses: Normal pulses and intact distal pulses.     Heart sounds: Murmur heard.  Midsystolic murmur is present with a grade of 3/6. Aortic Area    No gallop. No S3 or S4 sounds.     Comments: S1 is variable, S2 is normal. Pulmonary:     Effort: Pulmonary effort is normal.     Breath sounds: Normal breath sounds. No wheezing or rales.  Musculoskeletal:        General:  Normal range of motion.     Cervical back: Neck supple.     Right lower leg: Edema (1+, baseline) present.     Left lower leg: Edema (1+, baseline) present.  Skin:    General: Skin is warm and dry.        Assessment & Recommendations:   86 year old Caucasian male with coronary artery disease status (LAD PCI 2018), now permanentl atrial fibrillation, complex valvular heart disease, mod AI, mod TR, moderate pulmonary hypertension, venous insufficiency, Parkinsons disease, CKD  Leg edema: Combination of HFpEF, PH and venous insufficiency. He is not interested in any endovenous therapies. Continue Lasix 80 mg tid,  Reduce metolazone to 2.5 mg as needed, up to twice a week.  Permanent atrial fibrilation: Rate controlled. CHA2DS2VASc score 3: Annual stroke risk 3.2% Continue Eliquis 2.5 mg bid.  Continue diltiazem 120 mg for rate control.    Valvular disease: Mod aortic, motral, tricuspid regurg, likely all due to atrial fibrilation. Rate control for Afib, as above.  Pulmonary hypertension: WHO Grp II pulmonary hypertension due to left sided heart diseease (HFpEF, valvular heart disease, Afib- RHC 02/2018 showed PCWP 27 mmHg). Recommend continued management of hypertension and diuresis.  Given that we have opted for conservative management for the above cardiac issues, I do not think repeat echocardiogram would necessarily change the management.   H/o GI bleed: S/p hemorrhoidectomy in 08/2017.Currently, no bleeding with stable Hb. Tolerating Eliquis well.  CAD: Stable. Not on aspirin due to bleeding risk. Continue statin. Given his advanced age, renal dysfunction, recommend conservative medical management  Overall, management of his multiple cardiac issues is largely conservative owing to advanced age, multiple comorbidities.  I do not recommend echocardiogram, unless there is any significant change in his symptomatology.  I noticed patient to have significantly increased involuntary movements, which is a new finding for him.  He has an appointment with his neurologist Dr. Carles Collet, for management of his Parkinson's disease on 05/07/2021.  F/u 6 months   Esther Hardy, MD [facility] Pager: [phone] Office: [phone] If no answer Cell [phone]

## 2021-05-05 ENCOUNTER — Other Ambulatory Visit: Payer: Self-pay

## 2021-05-05 ENCOUNTER — Ambulatory Visit (HOSPITAL_BASED_OUTPATIENT_CLINIC_OR_DEPARTMENT_OTHER): Payer: Medicare Other | Attending: Orthopedic Surgery | Admitting: Physical Therapy

## 2021-05-05 DIAGNOSIS — M6281 Muscle weakness (generalized): Secondary | ICD-10-CM | POA: Diagnosis not present

## 2021-05-05 DIAGNOSIS — M25552 Pain in left hip: Secondary | ICD-10-CM | POA: Insufficient documentation

## 2021-05-05 DIAGNOSIS — R262 Difficulty in walking, not elsewhere classified: Secondary | ICD-10-CM | POA: Insufficient documentation

## 2021-05-05 DIAGNOSIS — R278 Other lack of coordination: Secondary | ICD-10-CM

## 2021-05-05 NOTE — Therapy (Signed)
OUTPATIENT PHYSICAL THERAPY LOWER EXTREMITY EVALUATION   Patient Name: [name] MRN: [id number] DOB:03-Jul-1935, 86 y.o., male Today's Date: 05/06/2021   PT End of Session - 05/06/21 1513     Visit Number 1    Number of Visits 12    Date for PT Re-Evaluation 06/16/21    Authorization Type MCR A and B    PT Start Time 1155    PT Stop Time 1245    PT Time Calculation (min) 50 min    Activity Tolerance Patient tolerated treatment well    Behavior During Therapy [facility] for tasks assessed/performed             Past Medical History:  Diagnosis Date   Arthritis    "knees; lower back" (08/09/2016)   Atrial fibrillation ([place])    remote hx/notes 08/07/2016   Coronary artery disease    Episodes of trembling    "most of the time it's my left hand" (08/09/2016)   GI bleeding 08/2017   High cholesterol    History of kidney stones    Hypertension    Melanoma of shoulder ([place]) 2000s   "left"   Mitral regurgitation and mitral stenosis    hx/notes 08/07/2016   Moderate aortic stenosis    hx/notes 08/07/2016   Parkinson's disease ([place])    Prostate cancer ([place]) 2011   hx/notes 08/07/2016   Past Surgical History:  Procedure Laterality Date   [place]  [place] [address] [place]   CARDIOVERSION N/A 01/26/2017   Procedure: CARDIOVERSION;  Surgeon: [name], MD;  Location: [facility];  Service: Cardiovascular;  Laterality: N/A;   CARDIOVERSION N/A 03/06/2018   Procedure: CARDIOVERSION;  Surgeon: [name], MD;  Location: [facility];  Service: Cardiovascular;  Laterality: N/A;   CATARACT EXTRACTION W/ INTRAOCULAR LENS  IMPLANT, BILATERAL Bilateral 2014-02/2016   left-right; hx/notes 08/07/2016   COLONOSCOPY WITH PROPOFOL N/A 09/25/2017   Procedure: COLONOSCOPY WITH PROPOFOL;  Surgeon: Laurence Spates, MD;  Location: [facility];  Service: Endoscopy;  Laterality: N/A;   CORONARY ANGIOPLASTY WITH STENT PLACEMENT  08/09/2016   CORONARY  ATHERECTOMY N/A 08/09/2016   Procedure: Coronary Atherectomy;  Surgeon: Adrian Prows, MD;  Location: [employer] CV LAB;  Service: Cardiovascular;  Laterality: N/A;   CORONARY STENT INTERVENTION N/A 08/09/2016   Procedure: Coronary Stent Intervention;  Surgeon: Adrian Prows, MD;  Location: [place] CV LAB;  Service: Cardiovascular;  Laterality: N/A;  LAD   EYE SURGERY Bilateral 05/2016   "laser on both lenses"   FEMUR IM NAIL Left 05/20/2020   Procedure: INTRAMEDULLARY (IM) NAIL FEMORAL;  Surgeon: Rod Can, MD;  Location: [facility];  Service: Orthopedics;  Laterality: Left;   HEMORRHOID SURGERY N/A 09/27/2017   Procedure: HEMORRHOIDECTOMY;  Surgeon: Erroll Luna, MD;  Location: [place];  Service: General;  Laterality: N/A;   INGUINAL HERNIA REPAIR Left [place]  06/25/2009   INTRAVASCULAR PRESSURE WIRE/FFR STUDY N/A 08/09/2016   Procedure: Intravascular Pressure Wire/FFR Study;  Surgeon: Adrian Prows, MD;  Location: [place] CV LAB;  Service: Cardiovascular;  Laterality: N/A;   [place] Left 1990s   "shoulder"   MELANOMA EXCISION Right 08/2018   right shoulder   MELANOMA EXCISION  04/20/2021   RIGHT HEART CATH N/A 03/20/2018   Procedure: RIGHT HEART CATH;  Surgeon: [name], MD;  Location: [place] CV LAB;  Service: Cardiovascular;  Laterality: N/A;   RIGHT/LEFT HEART CATH  AND CORONARY ANGIOGRAPHY N/A 08/09/2016   Procedure: Right/Left Heart Cath and Coronary Angiography;  Surgeon: Adrian Prows, MD;  Location: [place] CV LAB;  Service: Cardiovascular;  Laterality: N/A;   SURGERY FOR BROKEN FEMUR Left 05/24/2020   TEE WITHOUT CARDIOVERSION N/A 01/25/2017   Procedure: TRANSESOPHAGEAL ECHOCARDIOGRAM (TEE);  Surgeon: [name], MD;  Location: [facility] ENDOSCOPY;  Service: Cardiovascular;  Laterality: N/A;   Patient Active Problem List   Diagnosis Date Noted   Chronic heart failure with  preserved ejection fraction ([place]) 06/17/2020   Elevated hemidiaphragm    Closed fracture of left femur (HCC)    Pain    Hypokalemia 05/19/2020   Dilated cardiomyopathy ([place]) 05/19/2020   CKD (chronic kidney disease), stage III ([place]) 05/19/2020   Parkinson's disease ([place]) 05/19/2020   Closed left hip fracture ([place]) 05/18/2020   Intertrochanteric fracture ([place]) 05/18/2020   Essential hypertension 07/21/2018   Bilateral leg edema 07/21/2018   Laboratory examination 05/14/2018   Pulmonary hypertension ([place]) 03/16/2018   Nonrheumatic mitral valve regurgitation 03/16/2018   Tricuspid regurgitation 03/16/2018   CKD (chronic kidney disease) stage 3, GFR 30-59 ml/min (HCC) 09/26/2017   Chronic anticoagulation 09/26/2017   Atrial fibrillation ([place]) 09/26/2017   Atrial flutter ([place]) 02/11/2017   Post PTCA 08/09/2016   Dyspnea on exertion 08/07/2016   Coronary artery disease involving native coronary artery of native heart without angina pectoris 08/07/2016    PCP: Carol Ada, MD  REFERRING PROVIDER: Rod Can, MD  REFERRING DIAG: M70.62 (ICD-10-CM) - Trochanteric bursitis, left hip   THERAPY DIAG:  Muscle weakness (generalized)  Other lack of coordination  Pain in left hip  Difficulty in walking, not elsewhere classified  ONSET DATE: MD order 04/19/2021  SUBJECTIVE:   SUBJECTIVE STATEMENT: Pt c/o's of weakness in bilat LE's.  Pt's wife answers most of subjective questions.  Pt's wife states Pt saw Dr. Lyla Glassing in February and he referred pt to PT for bilat LE strengthening for his knees and hip.  Pt has a Hx of bilat knee pain and received gel injections and cortisone injections.    Pt requires walker to ambulate.  Pt states his legs are too weak to walk without walker.  Pt has difficulty and is limited with ambulation.  Pt is primarily ambulating in home and wife reports he is mainly just walking to B/R at home.  Pt's wife states he can't stand up straight.  Pt uses a  lift chair at home and has difficulty with sit/stand transfers.  Pt is limited with cooking and preparing food.  Has pain with standing.  Pt exercises for 30-45 mins 5 days per week.  He does some upper body resistance training, stretching, LAQ without wt, and squats.      PERTINENT HISTORY: -Parkinson's ;  Hx of bilat knee pain and OA ; neuropathy ; left hip intertrochanteric Fx with femur IM nailing 05/20/2020 ; CKD stage 3. -Cardiovascular Hx:  permanent atrial fibrillation with cardioversion in 2018 and 2020, chronic heart failure, coronary artery disease s/p Coronary angioplasty in 2018, complex valvular heart disease,  moderate pulmonary hypertension, venous insufficiency, and Dyspnea on exertion  PAIN:  Are you having pain? not currently at rest NPRS scale: 0/10 current, 10/10 worst Pain location: L hip   PRECAUTIONS: Other: Parkinson's.  Pt uses a walker.  Chronic heart failure, A-fib   WEIGHT BEARING RESTRICTIONS No  FALLS:  Has patient fallen in last 6 months? No  LIVING ENVIRONMENT: Lives with: lives with their spouse Lives in:  1 story home Stairs: 1 step to enter/exti home without rail Has following equipment at home: [facility], Quad cane, FWW, and lift chair  OCCUPATION: Pt is retired  PLOF: Independent; Pt ambulates with a FWW.    PATIENT GOALS improve strength in LE's, walk without walker   OBJECTIVE:   DIAGNOSTIC FINDINGS: N/A  PATIENT SURVEYS:  FOTO 31 with a goal of 55 at visit #16  COGNITION:  Overall cognitive status: Within functional limits for tasks assessed, but wife answers a lot of subjective questions      OBSERVATION:  Purple coloring and swelling in R > L distal LE  PALPATION: TTP:  none in lateral L hip   LE AROM/PROM:  A/PROM Right 05/06/2021 Left 05/06/2021  Hip flexion    Hip extension    Hip abduction    Hip adduction    Hip internal rotation    Hip external rotation Limited Limited and worse on L  Knee flexion    Knee extension     Ankle dorsiflexion    Ankle plantarflexion    Ankle inversion    Ankle eversion     (Blank rows = not tested)  LE MMT:  MMT Right 05/06/2021 Left 05/06/2021  Hip flexion 4/5 3+/5  Hip extension    Hip abduction weak weak  Hip adduction    Hip internal rotation    Hip external rotation 4/5 w/n available ROM Tolerates mild resistance  Knee flexion 4/5 4-/5  Knee extension Lacks 21 deg of ext; 5/5 w/n available ROm Lacks 28 deg of ext; 4/5  Ankle dorsiflexion    Ankle plantarflexion    Ankle inversion    Ankle eversion     (Blank rows = not tested)    FUNCTIONAL TESTS:  5 times sit to stand: 24 seconds with bilat UE assistance  Pt has difficulty with performing sit to stand transfers and it requires increased time to perform.  Pt was able to stand up without using UE's though was difficult and slow.   GAIT: Assistive device utilized: Environmental consultant - 2 wheeled Comments: Pt ambulates with a FWW with bilat knees flexed and fwd flexed/stooped over posture with increased Wb'ing thru UE's.    TODAY'S TREATMENT: See below for pt education.   PATIENT EDUCATION:  Education details: objective findings, dx, rationale of exercises, and POC.  PT answered Pt's questions.   Person educated: Patient and Spouse Education method: Explanation Education comprehension: verbalized understanding   HOME EXERCISE PROGRAM: Will give next visit  ASSESSMENT:  CLINICAL IMPRESSION: Patient is an 86 y.o. male with a dx of  L hip trochanteric bursitis almost 1 year s/p femoral IM nailing presenting to the clinic with muscle weakness L > R LE, balance deficits, L hip pain, bilat knee pain, and difficulty walking. Pt has multiple co-morbidities including parkinson's and an extensive cardiovascular hx.   Pt has difficulty and is limited with ambulation and is primarily ambulating in home.  Pt requires a walker to ambulate and has gait deficits including stooped over posture and bilat knee flexion.  Pt has  limitations in bilat knee extension.  Pt is slow with and has difficulty with sit/stand transfers.  It required increased time to perform 5x STS test. Pt is limited with cooking and preparing food and has pain with standing.  Pt should benefit from skilled PT services to address impairments and improve overall function.     OBJECTIVE IMPAIRMENTS Abnormal gait, decreased activity tolerance, decreased balance, decreased endurance, decreased mobility, difficulty walking,  decreased ROM, decreased strength, hypomobility, impaired flexibility, and pain.   ACTIVITY LIMITATIONS cleaning, community activity, meal prep, shopping, and ambulating and standing activities .   PERSONAL FACTORS Age and 3+ comorbidities: Parkinson's ;  Hx of bilat knee pain and OA ; neuropathy ; left hip intertrochanteric Fx with femur IM nailing 05/20/2020 ; A-fib, chronic heart failure, venous insufficiency, and DOE.  are also affecting patient's functional outcome.    REHAB POTENTIAL: Good  CLINICAL DECISION MAKING: Evolving/moderate complexity  EVALUATION COMPLEXITY: Moderate   GOALS:  SHORT TERM GOALS:  Pt will be independent and compliant with HEP for improved pain, strength, mobility, and function.   Baseline:  Target date: 05/26/2021 Goal status: INITIAL  2.  Pt or wife will report at least a 25% improvement in mobility.  Baseline:  Target date: 05/26/2021 Goal status: INITIAL  3.  Pt will demo improved posture with gait including standing up straighter and improved knee extension bilat for improved quality of gait Baseline:  Target date: 06/02/2021 Goal status: INITIAL  4.  Pt will report at least a 25% reduction in hip pain with standing activities and mobility. Baseline:  Target date: 06/02/2021 Goal status: INITIAL    LONG TERM GOALS:  Pt will demo improved bilat hip strength to 4+/5 MMT, L knee ext to 5/5, bilat hip abd to be [facility] in sitting, and L hip ER to 4/5 MMT for improved performance of  functional mobility Baseline:  Target date: 06/16/2021 Goal status: INITIAL  2.  Pt will perform 5x STS test with light UE support in no > 16 seconds for improved fxnl LE strength and performance of transfers. Baseline:  Target date: 06/16/2021 Goal status: INITIAL  3.  Pt will ambulate community distance safely with FWW without significant hip pain. Baseline:  Target date: 06/16/2021 Goal status: INITIAL  4.  Pt will have improved tolerance with standing to perform household chores including being able to prepare food without significant pain or difficulty.  Baseline:  Target date: 06/16/2021 Goal status: INITIAL    PLAN: PT FREQUENCY: 2x/week  PT DURATION: 6 weeks  PLANNED INTERVENTIONS: Therapeutic exercises, Therapeutic activity, Neuromuscular re-education, Balance training, Gait training, Patient/Family education, Joint mobilization, Stair training, Cryotherapy, Moist heat, Taping, and Manual therapy  PLAN FOR NEXT SESSION: Establish HEP.  Perform LAQ, SAQ, supine/seated clams, sit/stands, and balance activities.   Selinda Michaels III PT, DPT 05/06/21 9:53 PM

## 2021-05-06 ENCOUNTER — Encounter (HOSPITAL_BASED_OUTPATIENT_CLINIC_OR_DEPARTMENT_OTHER): Payer: Self-pay | Admitting: Physical Therapy

## 2021-05-06 DIAGNOSIS — M542 Cervicalgia: Secondary | ICD-10-CM | POA: Diagnosis not present

## 2021-05-06 DIAGNOSIS — J069 Acute upper respiratory infection, unspecified: Secondary | ICD-10-CM | POA: Diagnosis not present

## 2021-05-06 NOTE — Progress Notes (Signed)
? ? ?Assessment/Plan:  ? ?1.  Parkinsons Disease ? -Continue carbidopa/levodopa 25/100, 2 tablets 3 times per day.  Can take extra 1/2 to 1 prn. ? -Ambulate with walker at all times. ? -Follows with Dr. Ronnald Ramp for dermatology.  We discussed that it used to be thought that levodopa would increase risk of melanoma but now it is believed that Parkinsons itself likely increases risk of melanoma.    ? -getting ready to start PT ? ?2.  Renal insufficiency ? -Follows with nephrology ? ?3.  Atrial fibrillation ? -On Eliquis ? -Follows with cardiology ? ?4.  Feet pain paresthesias  ? -Continue gabapentin, 300 mg 3 times daily. ? ?5.  Head dyskinesia ? -Discussed with patient that this is a consequence of his medication.  Generally, however, this also means that medication is working. ? -not bothersome to the patient (wife notices more than he does) ? -hold on amantadine as not bothersome to the patient and risks of med>benefits ? ?6.  Jaw closing dystonia ? -This to started about a week ago. ? -Told him to keep an eye on it.  If it persists or get worse, we can consider Botox.  We talked about risks and benefits of Botox.  He would keep an eye on things for the next weeks and let me know. ? ?7.  Bradycardia ? -asymptommatic ? -will let cardiology know ? ? ?Subjective:  ? ?[name] was seen today in follow up for Parkinsons disease.  My previous records were reviewed prior to todays visit as well as outside records available to me.  No falls since our last visit.  He is to be ambulating with his walker at all times and states that he is using a walker all the time.  No lightheadedness or near syncope.  Patient did see cardiology on February 27.  Following that visit, his cardiologist contacted me with what sounded like head dyskinesia.  Patient does not notice it particularly, although wife does.  Describes some trouble with jaw - feels like it will get stuck closed.  Going on x 1 week.  States that he went to  his PA and was told to take Tylenol. ? ?Current prescribed movement disorder medications: ?Carbidopa/levodopa 25/100, 2 tablets at 7 AM/11 AM/4 PM ?Gabapentin, 300 mg 3 times per day  ? ?PREVIOUS MEDICATIONS: Gabapentin for peripheral neuropathy ? ?ALLERGIES:   ?Allergies  ?Allergen Reactions  ? Nsaids Other (See Comments)  ?  Cannot have any of these because he is currently taking Eliquis  ? ? ?CURRENT MEDICATIONS:  ?Outpatient Encounter Medications as of 05/07/2021  ?Medication Sig  ? acetaminophen (TYLENOL) 325 MG tablet Take 1-2 tablets (325-650 mg total) by mouth every 6 (six) hours as needed for mild pain (pain score 1-3 or temp > 100.5).  ? apixaban (ELIQUIS) 2.5 MG TABS tablet Take 1 tablet (2.5 mg total) by mouth 2 (two) times daily. (0800 & 2000)  ? carbidopa-levodopa (SINEMET IR) 25-100 MG tablet TAKE 2 TABLETS BY MOUTH THREE TIMES DAILY.  ? Cholecalciferol (VITAMIN D-3) 1000 units CAPS Take 1,000 Units by mouth daily. (0800)  ? dapagliflozin propanediol (FARXIGA) 10 MG TABS tablet Take 1 tablet by mouth daily.  ? diltiazem (CARDIZEM CD) 120 MG 24 hr capsule Take 1 capsule (120 mg total) by mouth daily.  ? Multiple Vitamins-Minerals (PRESERVISION AREDS PO) Take 1 tablet by mouth daily.  ? nitroGLYCERIN (NITROSTAT) 0.4 MG SL tablet TAKE 1 TABLET EVERY 5 MINUTES AS NEEDED FOR CHEST PAIN  ?  potassium chloride (KLOR-CON) 10 MEQ tablet Take 2 tablets (20 mEq total) by mouth 2 (two) times daily.  ? rosuvastatin (CRESTOR) 10 MG tablet Take 10 mg by mouth daily at 8 pm. (2000)  ? furosemide (LASIX) 40 MG tablet Take 1 tablet (40 mg total) by mouth daily. (Patient not taking: Reported on 05/06/2021)  ? furosemide (LASIX) 80 MG tablet TAKE 2 TABLETS EVERY MORNING AND TAKE 1 TABLET EVERY AFTERNOON AS DIRECTED  ? gabapentin (NEURONTIN) 300 MG capsule Take 1 capsule (300 mg total) by mouth 3 (three) times daily.  ? metolazone (ZAROXOLYN) 2.5 MG tablet Take 1 tablet (2.5 mg total) by mouth as needed. Twice a week (Patient  not taking: Reported on 05/06/2021)  ? polyethylene glycol (MIRALAX / GLYCOLAX) packet Take 17 g by mouth 2 (two) times daily. (Patient not taking: Reported on 05/07/2021)  ? ?No facility-administered encounter medications on file as of 05/07/2021.  ? ? ?Objective:  ? ?PHYSICAL EXAMINATION:   ? ?VITALS:   ?Vitals:  ? 05/07/21 1443  ?BP: 138/82  ?Pulse: (!) 52  ?Weight: 192 lb 12.8 oz (87.5 kg)  ?Height: '5\' 11"'$  (1.803 m)  ? ? ? ? ?GEN:  The patient appears stated age and is in NAD. ?HEENT:  Normocephalic, atraumatic.  The mucous membranes are moist. The superficial temporal arteries are without ropiness or tenderness. ?CV:  RRR ?Lungs:  CTAB ?Neck/HEME:  There are no carotid bruits bilaterally. ? ?Neurological examination: ? ?Orientation: The patient is alert and oriented x3. ?Cranial nerves: There is good facial symmetry with facial hypomimia. The speech is fluent and clear. Soft palate rises symmetrically and there is no tongue deviation. Hearing is intact to conversational tone. ?Sensation: Sensation is intact to light touch throughout ?Motor: Strength is at least antigravity x4. ? ?Movement examination: ?Tone: There is normal tone today. ?Abnormal movements: Mild head dyskinesia (same as last visit) ?Coordination:  There is good RAMs today ?Gait and Station: The patient has no difficulty arising out of a deep-seated chair without the use of the hands. The patient's stride length is good with the walker.  He is flexed at the knees but ambulates well ? ?I have reviewed and interpreted the following labs independently ? ?  Chemistry   ?   ?Component Value Date/Time  ? NA 135 04/21/2021 1015  ? K 3.4 (L) 04/21/2021 1015  ? CL 84 (L) 04/21/2021 1015  ? CO2 30 (H) 04/21/2021 1015  ? BUN 41 (H) 04/21/2021 1015  ? CREATININE 1.76 (H) 04/21/2021 1015  ?    ?Component Value Date/Time  ? CALCIUM 9.8 04/21/2021 1015  ? ALKPHOS 103 05/24/2020 0859  ? AST 32 05/24/2020 0859  ? ALT 10 05/24/2020 0859  ? BILITOT 5.1 (H) 05/24/2020  0859  ?  ? ? ? ?Lab Results  ?Component Value Date  ? WBC 6.3 05/24/2020  ? HGB 11.6 (L) 05/24/2020  ? HCT 36.9 (L) 05/24/2020  ? MCV 94.4 05/24/2020  ? PLT 172 05/24/2020  ? ? ?Lab Results  ?Component Value Date  ? TSH 2.94 09/15/2016  ? ? ? ?Total time spent on today's visit was 31 minutes, including both face-to-face time and nonface-to-face time.  Time included that spent on review of records (prior notes available to me/labs/imaging if pertinent), discussing treatment and goals, answering patient's questions and coordinating care. ? ?Cc:  Carol Ada, MD ? ?

## 2021-05-07 ENCOUNTER — Encounter: Payer: Self-pay | Admitting: Neurology

## 2021-05-07 ENCOUNTER — Other Ambulatory Visit: Payer: Self-pay

## 2021-05-07 ENCOUNTER — Ambulatory Visit (INDEPENDENT_AMBULATORY_CARE_PROVIDER_SITE_OTHER): Payer: Medicare Other | Admitting: Neurology

## 2021-05-07 VITALS — BP 138/82 | HR 52 | Ht 71.0 in | Wt 192.8 lb

## 2021-05-07 DIAGNOSIS — G20 Parkinson's disease: Secondary | ICD-10-CM | POA: Diagnosis not present

## 2021-05-07 DIAGNOSIS — G249 Dystonia, unspecified: Secondary | ICD-10-CM

## 2021-05-07 MED ORDER — CARBIDOPA-LEVODOPA 25-100 MG PO TABS: 2.0000 | ORAL_TABLET | Freq: Three times a day (TID) | ORAL | 2 refills | Status: DC

## 2021-05-07 NOTE — Patient Instructions (Signed)
Keep an eye on your jaw and let us know if we need to consider botox.  Local and Online Resources for Power over Parkinson's Group March 2023  LOCAL [place] PARKINSON'S GROUPS  Power over Parkinson's Group :   Power Over Parkinson's Patient Education Group will be Wednesday, March 8th-*Hybrid meting*- in person at [place] location and via [facility] at 2:00 pm.   Upcoming Power over Parkinson's Meetings:  2nd Wednesdays of the month at 2 pm:  March 8th, April 12th Contact Amy Marriott at [email or web address] if interested in participating in this group Parkinson's Care Partners Group:    3rd Mondays, Contact Misty Paladino Atypical Parkinsonian Patient Group:   4th Wednesdays, [place] If you are interested in participating in these groups with Misty, please contact her directly for how to join those meetings.  Her contact information is [email or web address].    LOCAL EVENTS AND NEW OFFERINGS Parkinson's Wellness Event:  Pottery Night at [facility] [place].  Friday, March 10th 5:30-7:30 pm.  Sponsored by [employer] [place].  FREE event for people with Parkinson's and care partners.  RSVP to [facility] at [email or web address] Dine out at [facility].  Celebrate Parkinson's disease Awareness Month and Support the Parkinson's Movement Disorder Fund.   Wednesday, April 19th 4-6 pm at [place], [employer].  (Give receipt to cashier and 20% will be donated) Parkinson's T-shirts for sale!  Designed by a local group member, with funds going to [place].  $20.00  [place] to purchase (see email above) New PWR! Moves Class offering at [employer]!  Fridays 1-2 pm in March (likely to switch days and times after March).  Come try it out and see if PWR! Moves is a good fit for your exercise routine!  Contact Amy Marriott for details:  [email or web address] Hamil-[place]err Challenge Bike, Run, Walk for PD, PSP, MSA.     Saturday, April 8th at 8 am.  [place]  Proceeds go to offset costs of exercise programs locally.  To Register, visit website:  www.[email or web address]  [place]:  www.[email or web address] PD Health at Home continues:  Mindfulness Mondays, Expert Briefing Tuesdays, Wellness Wednesdays, Take Time Thursdays, Fitness Fridays  Upcoming Education:  Parkinson's and Medications:  [employer].  Wednesday, March 8th at 1:00 pm. Freezing and Fall Prevention in Parkinson's.  Wednesday, April 12th at 1:00 pm Register for expert briefings Cytogeneticist) at WatchCalls.si [place] Chapter Parkinson's Symposium: Cognition Changes- a free in-person ([place], [place]) and online [employer]) event for people with Parkinson's and their loved ones. During this event we will explore new treatments and practical strategies for addressing these changes.  Saturday, April 1st, 10 am-1 pm.  Register at [email or web address]/ or contact [place] at [phone] or [email or web address].  Please check out their website to sign up for emails and see their full online offerings  [place]:  [email or web address]  Third Thursday Webinars:  On the third Thursday of every month at 12 p.m. ET, join our free live webinars to learn about various aspects of living with Parkinson's disease and our work to speed medical breakthroughs. Upcoming Webinar:  The Power of Women in Philanthropy.  Tues, March 28th at  12 pm. Check out additional information on their website to see their full online offerings  [employer]:  [email or web address] Upcoming Webinar:   Stay tuned Webinar Series:  Living with Parkinson's Meetup.   Third Thursdays of each month at 3 pm Care Partner Monthly Meetup.  With Robin Searing [place].  First Tuesday of each month, 2 pm Check  out additional information to Live Well Today on their website  Parkinson and Movement Disorders (PMD) Alliance:  www.pmdalliance.org [employer] Online:  Online Education Events Sign up for emails, which are sent weekly to give you updates on programming and online offerings  Parkinson's Association of the [place]:  www.[email or web address] Information on online support groups, education events, and online exercises including Yoga, Parkinson's exercises and more-LOTS of information on links to PD resources and online events Virtual Support Group through Parkinson's Association of the [place]; next one is scheduled for Wednesday, *April 5th at 2 pm. *March meeting has been cancelled. (These are typically scheduled for the 1st Wednesday of the month at 2 pm).  Visit website for details. MOVEMENT AND EXERCISE OPPORTUNITIES Parkinson's DRUMMING Classes/Music Therapy with Doylene Canning:  This is a returning class and it's FREE!  2nd Mondays, continuing March 13th , 11:00 at the [place].  Contact *Misty Taylor-Paladino at [email or web address] or Doylene Canning at [phone] or [email or web address]  PWR! Moves Classes at [place].  Wednesdays 10 and 11 am.   NEW PWR! Moves Class offering at [employer].  Fridays 1-2 pm.  Contact Amy Gerrit Friends, PT [email or web address] if interested. Here is a link to the PWR!Moves classes on Zoom from [place] - Daily Mon-Sat at 10:00. Via Zoom, FREE and open to all.  There is also a link below via Facebook if you use that platform. [email or web address].si [email or web address]  Parkinson's Wellness Recovery (PWR! Moves)  www.[email or web address] Info on the PWR! Virtual Experience:   You will have access to our expertise through self-assessment, guided plans that start with the PD-specific fundamentals, educational content, tips, Q&A with an expert, and a growing Art therapist of PD-specific pre-recorded and live exercise classes of varying types and intensity - both physical and cognitive! If that is not enough, we offer 1:1 wellness consultations (in-person or virtual) to personalize your PWR! Research scientist (medical).  [place] Fridays:  As part of the PD Health @ Home program, this free video series focuses each week on one aspect of fitness designed to support people living with Parkinson's.  These weekly videos highlight the [place] recent fitness guidelines for people with Parkinson's disease. ModemGamers.si Dance for PD website is offering free, live-stream classes throughout the week, as well as links to [employer] of classes:  [email or web address]/ Dance for Parkinson's in-person class.  February 1-April 26, Wednesdays 4-5 pm.  Free class for people with Parkinson's disease, at [address] [place], [place].  Contact [phone] or [email or web address] to register Virtual dance and Pilates for Parkinson's classes: Click on the Community Tab> Parkinson's Movement Initiative Tab.  To register for classes and for more information, visit [email or web address] and click the community tab.  [employer] Parkinson's Cycling Classes  [employer]:  Thursdays @ Noon-Live classes at [employer] ([employer] at [email or web address] or [phone]) [employer]: Virtual Classes Mondays and Thursdays Jeanette Caprice classes Tuesday, Wednesday and Thursday (contact Champ at [email or web address]  or [phone]) [place] Varied levels of classes are offered Mondays, Tuesdays and Thursdays at [facility].  Stretching with Verdis Frederickson  weekly class is also offered for people with Parkinson's To observe a class or for more information, call [phone] or email Hezzie Bump at [email or web address] .[email or web address] ADDITIONAL SUPPORT AND RESOURCES Well-Spring Solutions:Online Caregiver Education Opportunities:  www.well-springsolutions.org/caregiver-education/caregiver-support-group.  You may also contact Vickki Muff at [email or web address] or [phone].  Well-Spring Navigator:  [employer] program, a free service to help individuals and families through the journey of determining care for older adults.  The Navigator is a Education officer, museum, Arnell Asal, who will speak with a prospective client and/or loved ones to provide an assessment of the situation and a set of recommendations for a personalized care plan -- all free of charge, and whether Well-Spring Solutions offers the needed service or not. If the need is not a service we provide, we are well-connected with reputable programs in town that we can refer you to.  www.well-springsolutions.org or to speak with the Navigator, call [phone]

## 2021-05-11 ENCOUNTER — Other Ambulatory Visit: Payer: Self-pay

## 2021-05-11 ENCOUNTER — Ambulatory Visit (HOSPITAL_BASED_OUTPATIENT_CLINIC_OR_DEPARTMENT_OTHER): Payer: Medicare Other | Admitting: Physical Therapy

## 2021-05-11 DIAGNOSIS — R262 Difficulty in walking, not elsewhere classified: Secondary | ICD-10-CM | POA: Diagnosis not present

## 2021-05-11 DIAGNOSIS — M6281 Muscle weakness (generalized): Secondary | ICD-10-CM | POA: Diagnosis not present

## 2021-05-11 DIAGNOSIS — M25552 Pain in left hip: Secondary | ICD-10-CM | POA: Diagnosis not present

## 2021-05-11 DIAGNOSIS — R278 Other lack of coordination: Secondary | ICD-10-CM | POA: Diagnosis not present

## 2021-05-11 NOTE — Therapy (Signed)
?OUTPATIENT PHYSICAL THERAPY TREATMENT NOTE ? ? ?Patient Name: [name] ?MRN: [id number] ?DOB:13-Dec-1935, 86 y.o., male ?Today's Date: 05/12/2021 ? ?PCP: Carol Ada, MD ?REFERRING PROVIDER: Carol Ada, MD ? ? PT End of Session - 05/11/21 1031   ? ? Visit Number 2   ? Number of Visits 12   ? Date for PT Re-Evaluation 06/16/21   ? Authorization Type MCR A and B   ? PT Start Time 1025   ? PT Stop Time 1106   ? PT Time Calculation (min) 41 min   ? Activity Tolerance Patient tolerated treatment well   ? Behavior During Therapy [facility] for tasks assessed/performed   ? ?  ?  ? ?  ? ? ?Past Medical History:  ?Diagnosis Date  ? Arthritis   ? "knees; lower back" (08/09/2016)  ? Atrial fibrillation ([place])   ? remote hx/notes 08/07/2016  ? Coronary artery disease   ? Episodes of trembling   ? "most of the time it's my left hand" (08/09/2016)  ? GI bleeding 08/2017  ? High cholesterol   ? History of kidney stones   ? Hypertension   ? Melanoma of shoulder ([place]) 2000s  ? "left"  ? Mitral regurgitation and mitral stenosis   ? hx/notes 08/07/2016  ? Moderate aortic stenosis   ? hx/notes 08/07/2016  ? Parkinson's disease ([place])   ? Prostate cancer [facility]) 2011  ? hx/notes 08/07/2016  ? ?Past Surgical History:  ?Procedure Laterality Date  ? CARDIAC CATHETERIZATION  ~ 2001  ? "[place], [place]"  ? CARDIOVERSION N/A 01/26/2017  ? Procedure: CARDIOVERSION;  Surgeon: [name], MD;  Location: [facility];  Service: Cardiovascular;  Laterality: N/A;  ? CARDIOVERSION N/A 03/06/2018  ? Procedure: CARDIOVERSION;  Surgeon: [name], MD;  Location: [facility];  Service: Cardiovascular;  Laterality: N/A;  ? CATARACT EXTRACTION W/ INTRAOCULAR LENS  IMPLANT, BILATERAL Bilateral 2014-02/2016  ? left-right; hx/notes 08/07/2016  ? COLONOSCOPY WITH PROPOFOL N/A 09/25/2017  ? Procedure: COLONOSCOPY WITH PROPOFOL;  Surgeon: Laurence Spates, MD;  Location: [facility];  Service: Endoscopy;  Laterality: N/A;  ? CORONARY  ANGIOPLASTY WITH STENT PLACEMENT  08/09/2016  ? CORONARY ATHERECTOMY N/A 08/09/2016  ? Procedure: Coronary Atherectomy;  Surgeon: Adrian Prows, MD;  Location: [facility];  Service: Cardiovascular;  Laterality: N/A;  ? CORONARY STENT INTERVENTION N/A 08/09/2016  ? Procedure: Coronary Stent Intervention;  Surgeon: Adrian Prows, MD;  Location: [facility];  Service: Cardiovascular;  Laterality: N/A;  LAD  ? EYE SURGERY Bilateral 05/2016  ? "laser on both lenses"  ? FEMUR IM NAIL Left 05/20/2020  ? Procedure: INTRAMEDULLARY (IM) NAIL FEMORAL;  Surgeon: Rod Can, MD;  [facility];  Service: Orthopedics;  Laterality: Left;  ? HEMORRHOID SURGERY N/A 09/27/2017  ? Procedure: HEMORRHOIDECTOMY;  Surgeon: Erroll Luna, MD;  Location: [facility];  Service: General;  Laterality: N/A;  ? INGUINAL HERNIA REPAIR Left 1991  ? INSERTION PROSTATE RADIATION SEED  06/25/2009  ? INTRAVASCULAR PRESSURE WIRE/FFR STUDY N/A 08/09/2016  ? Procedure: Intravascular Pressure Wire/FFR Study;  Surgeon: Adrian Prows, MD;  Location: [place] [facility];  Service: Cardiovascular;  Laterality: N/A;  ? [place]  ? MELANOMA EXCISION Left 1990s  ? "shoulder"  ? MELANOMA EXCISION Right 08/2018  ? right shoulder  ? MELANOMA EXCISION  04/20/2021  ? RIGHT HEART CATH N/A 03/20/2018  ? Procedure: RIGHT HEART CATH;  Surgeon: [name], MD;  Location: [place] [facility];  Service:  Cardiovascular;  Laterality: N/A;  ? RIGHT/LEFT HEART CATH AND CORONARY ANGIOGRAPHY N/A 08/09/2016  ? Procedure: Right/Left Heart Cath and Coronary Angiography;  Surgeon: Adrian Prows, MD;  Location: [place];  Service: Cardiovascular;  Laterality: N/A;  ? SURGERY FOR BROKEN FEMUR Left 05/24/2020  ? TEE WITHOUT CARDIOVERSION N/A 01/25/2017  ? Procedure: TRANSESOPHAGEAL ECHOCARDIOGRAM (TEE);  Surgeon: [name], MD;  Location: [facility];  Service: Cardiovascular;  Laterality: N/A;  ? ?Patient Active Problem  List  ? Diagnosis Date Noted  ? Chronic heart failure with preserved ejection fraction ([place]) 06/17/2020  ? Elevated hemidiaphragm   ? Closed fracture of left femur ([place])   ? Pain   ? Hypokalemia 05/19/2020  ? Dilated cardiomyopathy ([place]) 05/19/2020  ? CKD (chronic kidney disease), stage III ([place]) 05/19/2020  ? Parkinson's disease ([place]) 05/19/2020  ? Closed left hip fracture ([place]) 05/18/2020  ? Intertrochanteric fracture ([place]) 05/18/2020  ? Essential hypertension 07/21/2018  ? Bilateral leg edema 07/21/2018  ? Laboratory examination 05/14/2018  ? Pulmonary hypertension ([place]) 03/16/2018  ? Nonrheumatic mitral valve regurgitation 03/16/2018  ? Tricuspid regurgitation 03/16/2018  ? CKD (chronic kidney disease) stage 3, GFR 30-59 ml/min ([facility]) 09/26/2017  ? Chronic anticoagulation 09/26/2017  ? Atrial fibrillation ([place]) 09/26/2017  ? Atrial flutter ([place]) 02/11/2017  ? Post PTCA 08/09/2016  ? Dyspnea on exertion 08/07/2016  ? Coronary artery disease involving native coronary artery of native heart without angina pectoris 08/07/2016  ? ? ?REFERRING PROVIDER: Rod Can, MD ?  ?REFERRING DIAG: M70.62 (ICD-10-CM) - Trochanteric bursitis, left hip  ?  ?THERAPY DIAG:  ?Muscle weakness (generalized) ?  ?Other lack of coordination ?  ?Pain in left hip ?  ?Difficulty in walking, not elsewhere classified ?  ?ONSET DATE: MD order 04/19/2021 ?  ?SUBJECTIVE:  ?  ?SUBJECTIVE STATEMENT: ?-Pt requires walker to ambulate.  Pt states his legs are too weak to walk without walker.  Pt has difficulty and is limited with ambulation.  Pt is primarily ambulating in home and wife reports he is mainly just walking to B/R at home.  Pt's wife states he can't stand up straight.  Pt uses a lift chair at home and has difficulty with sit/stand transfers.  Pt is limited with cooking and preparing food.  Has pain with standing. ?  ?-Pt exercises for 30-45 mins 5 days per week.  He does some upper body resistance training, stretching, LAQ without wt,  and squats.  ? ?-Pt denies any adverse effects after prior Rx.    ?  ?PERTINENT HISTORY: ?-Parkinson's ;  Hx of bilat knee pain and OA ; neuropathy ; left hip intertrochanteric Fx with femur IM nailing 05/20/2020 ; CKD stage 3. ?-Cardiovascular Hx:  permanent atrial fibrillation with cardioversion in 2018 and 2020, chronic heart failure, coronary artery disease s/p Coronary angioplasty in 2018, complex valvular heart disease,  moderate pulmonary hypertension, venous insufficiency, and Dyspnea on exertion ?  ?PAIN:  ?Are you having pain? "a little" ?NPRS scale: 2/10 current, 10/10 worst ?Pain location: L post hip ?Pt denies pain in bilat knees ?  ?  ?PRECAUTIONS: Other: Parkinson's.  Pt uses a walker.  Chronic heart failure, A-fib  ?  ?WEIGHT BEARING RESTRICTIONS No ?  ?FALLS:  ?Has patient fallen in last 6 months? No ?  ?LIVING ENVIRONMENT: ?Lives with: lives with their spouse ?Lives in: 1 story home ?Stairs: 1 step to enter/exti home without rail ?Has following equipment at home: [facility], Quad cane, FWW, and lift chair ?  ?  OCCUPATION: Pt is retired ?  ?PLOF: Independent; Pt ambulates with a FWW.   ?  ?PATIENT GOALS improve strength in LE's, walk without walker ?  ?  ?OBJECTIVE:  ?  ?DIAGNOSTIC FINDINGS: N/A ?  ?  ?OBSERVATION:  ?Purple coloring and swelling in R > L distal LE ?  ? ?GAIT: ?Assistive device utilized: Walker - 2 wheeled ?Comments: Pt ambulates with a FWW with bilat knees flexed and fwd flexed/stooped over posture with increased Wb'ing thru UE's. ?  ? ? Standing posture:  Rightward lean with R knee flexed  ?  ?  ?TODAY'S TREATMENT: ?Therapeutic Exercise: ?Pt performed: ? LAQ 2x10-12 reps ? Seated marching 2x10 reps ? Seated clams with RTB x10 reps ? Seated hip abd with manual resistance from PT and pt approx 10 reps with 5 sec hold ? Supine clams with YTB with head elevated x10 reps ? SAQ x 10 reps ?-PT gave pt a HEP handout and educated pt and wife with correct form and appropriate frequency.   ? ?Neuro  Re-ed activities: ?Pt performed: ? Standing without UE assistance with CGA 2x1 min with cues to stand up straight and to straighten knee ? Low level marching with UE assistance with CGA x 10 reps ? Standing weight s

## 2021-05-12 ENCOUNTER — Encounter (HOSPITAL_BASED_OUTPATIENT_CLINIC_OR_DEPARTMENT_OTHER): Payer: Self-pay | Admitting: Physical Therapy

## 2021-05-13 ENCOUNTER — Ambulatory Visit (HOSPITAL_BASED_OUTPATIENT_CLINIC_OR_DEPARTMENT_OTHER): Payer: Medicare Other | Admitting: Physical Therapy

## 2021-05-13 ENCOUNTER — Other Ambulatory Visit: Payer: Self-pay

## 2021-05-13 DIAGNOSIS — R278 Other lack of coordination: Secondary | ICD-10-CM | POA: Diagnosis not present

## 2021-05-13 DIAGNOSIS — M6281 Muscle weakness (generalized): Secondary | ICD-10-CM

## 2021-05-13 DIAGNOSIS — M25552 Pain in left hip: Secondary | ICD-10-CM

## 2021-05-13 DIAGNOSIS — R262 Difficulty in walking, not elsewhere classified: Secondary | ICD-10-CM | POA: Diagnosis not present

## 2021-05-13 NOTE — Therapy (Signed)
?OUTPATIENT PHYSICAL THERAPY TREATMENT NOTE ? ? ?Patient Name: [name] ?MRN: [id number] ?DOB:1935-11-08, 86 y.o., male ?Today's Date: 05/14/2021 ? ?PCP: Carol Ada, MD ?REFERRING PROVIDER: Carol Ada, MD ? ? PT End of Session - 05/13/21 1437   ? ? Visit Number 3   ? Number of Visits 12   ? Date for PT Re-Evaluation 06/16/21   ? Authorization Type MCR A and B   ? Progress Note Due on Visit 10   ? PT Start Time 1351   ? PT Stop Time 9562   ? PT Time Calculation (min) 41 min   ? Equipment Utilized During Treatment --   FWW  ? Activity Tolerance Patient tolerated treatment well   ? Behavior During Therapy [facility] for tasks assessed/performed   ? ?  ?  ? ?  ? ? ? ?Past Medical History:  ?Diagnosis Date  ? Arthritis   ? "knees; lower back" (08/09/2016)  ? Atrial fibrillation ([place])   ? remote hx/notes 08/07/2016  ? Coronary artery disease   ? Episodes of trembling   ? "most of the time it's my left hand" (08/09/2016)  ? GI bleeding 08/2017  ? High cholesterol   ? History of kidney stones   ? Hypertension   ? Melanoma of shoulder ([place]) 2000s  ? "left"  ? Mitral regurgitation and mitral stenosis   ? hx/notes 08/07/2016  ? Moderate aortic stenosis   ? hx/notes 08/07/2016  ? Parkinson's disease ([place])   ? Prostate cancer [facility]) 2011  ? hx/notes 08/07/2016  ? ?Past Surgical History:  ?Procedure Laterality Date  ? CARDIAC CATHETERIZATION  ~ 2001  ? "[place], [place]"  ? CARDIOVERSION N/A 01/26/2017  ? Procedure: CARDIOVERSION;  Surgeon: [name], MD;  Location: [place];  Service: Cardiovascular;  Laterality: N/A;  ? CARDIOVERSION N/A 03/06/2018  ? Procedure: CARDIOVERSION;  Surgeon: [name], MD;  Location: [place] [facility];  Service: Cardiovascular;  Laterality: N/A;  ? CATARACT EXTRACTION W/ INTRAOCULAR LENS  IMPLANT, BILATERAL Bilateral 2014-02/2016  ? left-right; hx/notes 08/07/2016  ? COLONOSCOPY WITH PROPOFOL N/A 09/25/2017  ? Procedure: COLONOSCOPY WITH PROPOFOL;  Surgeon: Laurence Spates, MD;   Location: [place];  Service: Endoscopy;  Laterality: N/A;  ? CORONARY ANGIOPLASTY WITH STENT PLACEMENT  08/09/2016  ? CORONARY ATHERECTOMY N/A 08/09/2016  ? Procedure: Coronary Atherectomy;  Surgeon: Adrian Prows, MD;  Location: [place] [facility];  Service: Cardiovascular;  Laterality: N/A;  ? CORONARY STENT INTERVENTION N/A 08/09/2016  ? Procedure: Coronary Stent Intervention;  Surgeon: Adrian Prows, MD;  Location: [place] [facility];  Service: Cardiovascular;  Laterality: N/A;  LAD  ? EYE SURGERY Bilateral 05/2016  ? "laser on both lenses"  ? FEMUR IM NAIL Left 05/20/2020  ? Procedure: INTRAMEDULLARY (IM) NAIL FEMORAL;  Surgeon: Rod Can, MD;  [facility];  Service: Orthopedics;  Laterality: Left;  ? HEMORRHOID SURGERY N/A 09/27/2017  ? Procedure: HEMORRHOIDECTOMY;  Surgeon: Erroll Luna, MD;  Location: [place];  Service: General;  Laterality: N/A;  ? INGUINAL HERNIA REPAIR Left 1991  ? INSERTION PROSTATE RADIATION SEED  06/25/2009  ? INTRAVASCULAR PRESSURE WIRE/FFR STUDY N/A 08/09/2016  ? Procedure: Intravascular Pressure Wire/FFR Study;  Surgeon: Adrian Prows, MD;  Location: [place] [facility];  Service: Cardiovascular;  Laterality: N/A;  ? [place]  ? MELANOMA EXCISION Left 1990s  ? "shoulder"  ? MELANOMA EXCISION Right 08/2018  ? right shoulder  ? MELANOMA EXCISION  04/20/2021  ? RIGHT HEART CATH N/A 03/20/2018  ?  Procedure: RIGHT HEART CATH;  Surgeon: [name], MD;  Location: [place] [facility];  Service: Cardiovascular;  Laterality: N/A;  ? RIGHT/LEFT HEART CATH AND CORONARY ANGIOGRAPHY N/A 08/09/2016  ? Procedure: Right/Left Heart Cath and Coronary Angiography;  Surgeon: Adrian Prows, MD;  Location: [place] [facility];  Service: Cardiovascular;  Laterality: N/A;  ? SURGERY FOR BROKEN FEMUR Left 05/24/2020  ? TEE WITHOUT CARDIOVERSION N/A 01/25/2017  ? Procedure: TRANSESOPHAGEAL ECHOCARDIOGRAM (TEE);  Surgeon: [name], MD;  Location: [place];   Service: Cardiovascular;  Laterality: N/A;  ? ?Patient Active Problem List  ? Diagnosis Date Noted  ? Chronic heart failure with preserved ejection fraction ([place]) 06/17/2020  ? Elevated hemidiaphragm   ? Closed fracture of left femur ([place])   ? Pain   ? Hypokalemia 05/19/2020  ? Dilated cardiomyopathy ([place]) 05/19/2020  ? CKD (chronic kidney disease), stage III ([place]) 05/19/2020  ? Parkinson's disease ([place]) 05/19/2020  ? Closed left hip fracture ([place]) 05/18/2020  ? Intertrochanteric fracture ([place]) 05/18/2020  ? Essential hypertension 07/21/2018  ? Bilateral leg edema 07/21/2018  ? Laboratory examination 05/14/2018  ? Pulmonary hypertension ([place]) 03/16/2018  ? Nonrheumatic mitral valve regurgitation 03/16/2018  ? Tricuspid regurgitation 03/16/2018  ? CKD (chronic kidney disease) stage 3, GFR 30-59 ml/min (HCC) 09/26/2017  ? Chronic anticoagulation 09/26/2017  ? Atrial fibrillation ([place]) 09/26/2017  ? Atrial flutter ([place]) 02/11/2017  ? Post PTCA 08/09/2016  ? Dyspnea on exertion 08/07/2016  ? Coronary artery disease involving native coronary artery of native heart without angina pectoris 08/07/2016  ? ? ?REFERRING PROVIDER: Rod Can, MD ?  ?REFERRING DIAG: M70.62 (ICD-10-CM) - Trochanteric bursitis, left hip  ?  ?THERAPY DIAG:  ?Muscle weakness (generalized) ?  ?Other lack of coordination ?  ?Pain in left hip ?  ?Difficulty in walking, not elsewhere classified ?  ?ONSET DATE: MD order 04/19/2021 ?  ?SUBJECTIVE:  ?  ?SUBJECTIVE STATEMENT: ?-Pt requires walker to ambulate.  Pt states his legs are too weak to walk without walker.  Pt has difficulty and is limited with ambulation.  Pt is primarily ambulating in home and wife reports he is mainly just walking to B/R at home.  Pt's wife states he can't stand up straight.  Pt uses a lift chair at home and has difficulty with sit/stand transfers.  Pt is limited with cooking and preparing food.  Has pain with standing. ?  ?-Pt exercises for 30-45 mins 5 days per week.  He  does some upper body resistance training, stretching, LAQ without wt, and squats.  ? ?-Pt reports he was sore after prior Rx and states he was sore all over.  Pt rode his stationary bike for 1 mile and had 6/10 pain afterwards.  Pt reports compliance with HEP.  ?  ?PERTINENT HISTORY: ?-Parkinson's ;  Hx of bilat knee pain and OA ; neuropathy ; left hip intertrochanteric Fx with femur IM nailing 05/20/2020 ; CKD stage 3. ?-Cardiovascular Hx:  permanent atrial fibrillation with cardioversion in 2018 and 2020, chronic heart failure, coronary artery disease s/p Coronary angioplasty in 2018, complex valvular heart disease,  moderate pulmonary hypertension, venous insufficiency, and Dyspnea on exertion ?  ?PAIN:  ?Are you having pain? "a little" ?NPRS scale: 3-4/10 current in L hip and 7-8/10 in bilat knees ; 10/10 worst ?  ?  ?PRECAUTIONS: Other: Parkinson's.  Pt uses a walker.  Chronic heart failure, A-fib  ?  ?WEIGHT BEARING RESTRICTIONS No ?  ?FALLS:  ?Has patient fallen in  last 6 months? No ?  ?LIVING ENVIRONMENT: ?Lives with: lives with their spouse ?Lives in: 1 story home ?Stairs: 1 step to enter/exti home without rail ?Has following equipment at home: [facility], Quad cane, FWW, and lift chair ?  ?OCCUPATION: Pt is retired ?  ?PLOF: Independent; Pt ambulates with a FWW.   ?  ?PATIENT GOALS improve strength in LE's, walk without walker ?  ?  ?OBJECTIVE:  ?  ?DIAGNOSTIC FINDINGS: N/A ?  ?  ?OBSERVATION:  ?Purple coloring and swelling in R > L distal LE ?  ? ?GAIT: ?Assistive device utilized: Walker - 2 wheeled ?Comments: Pt ambulates with a FWW with bilat knees flexed and fwd flexed/stooped over posture with increased Wb'ing thru UE's. ?  ? ? Standing posture:  Rightward lean with R knee flexed  ?  ?  ?TODAY'S TREATMENT: ?Therapeutic Exercise: ?Pt performed: ? LAQ 2x10-12 reps ? Seated marching 2x10 reps ? Seated hip abd iso with belt x 10 reps with 5 sec hold ? Supine clams with YTB with head elevated x10 reps ? SAQ 2 x 10  reps ? Sit to stands x 10 reps from hi/low table with UE's on knees ?   ? ?Neuro Re-ed activities: ?Pt performed: ? Standing without UE assistance with CGA 2x1 min with cues to stand up straight and

## 2021-05-14 ENCOUNTER — Encounter (HOSPITAL_BASED_OUTPATIENT_CLINIC_OR_DEPARTMENT_OTHER): Payer: Self-pay | Admitting: Physical Therapy

## 2021-05-18 ENCOUNTER — Ambulatory Visit (HOSPITAL_BASED_OUTPATIENT_CLINIC_OR_DEPARTMENT_OTHER): Payer: Medicare Other | Admitting: Physical Therapy

## 2021-05-25 ENCOUNTER — Ambulatory Visit (HOSPITAL_BASED_OUTPATIENT_CLINIC_OR_DEPARTMENT_OTHER): Payer: Medicare Other | Admitting: Physical Therapy

## 2021-05-25 ENCOUNTER — Encounter (HOSPITAL_BASED_OUTPATIENT_CLINIC_OR_DEPARTMENT_OTHER): Payer: Self-pay | Admitting: Physical Therapy

## 2021-05-25 ENCOUNTER — Other Ambulatory Visit: Payer: Self-pay

## 2021-05-25 DIAGNOSIS — N183 Chronic kidney disease, stage 3 unspecified: Secondary | ICD-10-CM | POA: Diagnosis not present

## 2021-05-25 DIAGNOSIS — R262 Difficulty in walking, not elsewhere classified: Secondary | ICD-10-CM | POA: Diagnosis not present

## 2021-05-25 DIAGNOSIS — R278 Other lack of coordination: Secondary | ICD-10-CM

## 2021-05-25 DIAGNOSIS — M6281 Muscle weakness (generalized): Secondary | ICD-10-CM

## 2021-05-25 DIAGNOSIS — M25552 Pain in left hip: Secondary | ICD-10-CM

## 2021-05-25 NOTE — Therapy (Signed)
?OUTPATIENT PHYSICAL THERAPY TREATMENT NOTE ? ? ?Patient Name: [name] ?MRN: [id number] ?DOB:Nov 20, 1935, 86 y.o., male ?Today's Date: 05/25/2021 ? ?PCP: Carol Ada, MD ?REFERRING PROVIDER: Carol Ada, MD ? ? PT End of Session - 05/25/21 1118   ? ? Visit Number 4   ? Number of Visits 12   ? Date for PT Re-Evaluation 06/16/21   ? Authorization Type MCR A and B   ? Progress Note Due on Visit 10   ? PT Start Time 1115   ? PT Stop Time 1200   ? PT Time Calculation (min) 45 min   ? Activity Tolerance Patient tolerated treatment well   ? Behavior During Therapy [facility] for tasks assessed/performed   ? ?  ?  ? ?  ? ? ? ?Past Medical History:  ?Diagnosis Date  ? Arthritis   ? "knees; lower back" (08/09/2016)  ? Atrial fibrillation ([place])   ? remote hx/notes 08/07/2016  ? Coronary artery disease   ? Episodes of trembling   ? "most of the time it's my left hand" (08/09/2016)  ? GI bleeding 08/2017  ? High cholesterol   ? History of kidney stones   ? Hypertension   ? Melanoma of shoulder ([place]) 2000s  ? "left"  ? Mitral regurgitation and mitral stenosis   ? hx/notes 08/07/2016  ? Moderate aortic stenosis   ? hx/notes 08/07/2016  ? Parkinson's disease ([place])   ? Prostate cancer [facility]) 2011  ? hx/notes 08/07/2016  ? ?Past Surgical History:  ?Procedure Laterality Date  ? CARDIAC CATHETERIZATION  ~ 2001  ? "[place], [place]"  ? CARDIOVERSION N/A 01/26/2017  ? Procedure: CARDIOVERSION;  Surgeon: [name], MD;  Location: [place];  Service: Cardiovascular;  Laterality: N/A;  ? CARDIOVERSION N/A 03/06/2018  ? Procedure: CARDIOVERSION;  Surgeon: [name], MD;  Location: [place] [facility];  Service: Cardiovascular;  Laterality: N/A;  ? CATARACT EXTRACTION W/ INTRAOCULAR LENS  IMPLANT, BILATERAL Bilateral 2014-02/2016  ? left-right; hx/notes 08/07/2016  ? COLONOSCOPY WITH PROPOFOL N/A 09/25/2017  ? Procedure: COLONOSCOPY WITH PROPOFOL;  Surgeon: Laurence Spates, MD;  Location: [place];  Service: Endoscopy;   Laterality: N/A;  ? CORONARY ANGIOPLASTY WITH STENT PLACEMENT  08/09/2016  ? CORONARY ATHERECTOMY N/A 08/09/2016  ? Procedure: Coronary Atherectomy;  Surgeon: Adrian Prows, MD;  Location: [place] [facility];  Service: Cardiovascular;  Laterality: N/A;  ? CORONARY STENT INTERVENTION N/A 08/09/2016  ? Procedure: Coronary Stent Intervention;  Surgeon: Adrian Prows, MD;  Location: [place] [facility];  Service: Cardiovascular;  Laterality: N/A;  LAD  ? EYE SURGERY Bilateral 05/2016  ? "laser on both lenses"  ? FEMUR IM NAIL Left 05/20/2020  ? Procedure: INTRAMEDULLARY (IM) NAIL FEMORAL;  Surgeon: Rod Can, MD;  [facility];  Service: Orthopedics;  Laterality: Left;  ? HEMORRHOID SURGERY N/A 09/27/2017  ? Procedure: HEMORRHOIDECTOMY;  Surgeon: Erroll Luna, MD;  Location: [place];  Service: General;  Laterality: N/A;  ? INGUINAL HERNIA REPAIR Left 1991  ? INSERTION PROSTATE RADIATION SEED  06/25/2009  ? INTRAVASCULAR PRESSURE WIRE/FFR STUDY N/A 08/09/2016  ? Procedure: Intravascular Pressure Wire/FFR Study;  Surgeon: Adrian Prows, MD;  Location: [place] [facility];  Service: Cardiovascular;  Laterality: N/A;  ? [place]  ? MELANOMA EXCISION Left 1990s  ? "shoulder"  ? MELANOMA EXCISION Right 08/2018  ? right shoulder  ? MELANOMA EXCISION  04/20/2021  ? RIGHT HEART CATH N/A 03/20/2018  ? Procedure: RIGHT HEART CATH;  Surgeon: Vernell Leep  J, MD;  Location: [place] [facility];  Service: Cardiovascular;  Laterality: N/A;  ? RIGHT/LEFT HEART CATH AND CORONARY ANGIOGRAPHY N/A 08/09/2016  ? Procedure: Right/Left Heart Cath and Coronary Angiography;  Surgeon: Adrian Prows, MD;  Location: [place] [facility];  Service: Cardiovascular;  Laterality: N/A;  ? SURGERY FOR BROKEN FEMUR Left 05/24/2020  ? TEE WITHOUT CARDIOVERSION N/A 01/25/2017  ? Procedure: TRANSESOPHAGEAL ECHOCARDIOGRAM (TEE);  Surgeon: [name], MD;  Location: [place];  Service: Cardiovascular;  Laterality: N/A;   ? ?Patient Active Problem List  ? Diagnosis Date Noted  ? Chronic heart failure with preserved ejection fraction ([place]) 06/17/2020  ? Elevated hemidiaphragm   ? Closed fracture of left femur ([place])   ? Pain   ? Hypokalemia 05/19/2020  ? Dilated cardiomyopathy ([place]) 05/19/2020  ? CKD (chronic kidney disease), stage III ([place]) 05/19/2020  ? Parkinson's disease ([place]) 05/19/2020  ? Closed left hip fracture ([place]) 05/18/2020  ? Intertrochanteric fracture ([place]) 05/18/2020  ? Essential hypertension 07/21/2018  ? Bilateral leg edema 07/21/2018  ? Laboratory examination 05/14/2018  ? Pulmonary hypertension ([place]) 03/16/2018  ? Nonrheumatic mitral valve regurgitation 03/16/2018  ? Tricuspid regurgitation 03/16/2018  ? CKD (chronic kidney disease) stage 3, GFR 30-59 ml/min (HCC) 09/26/2017  ? Chronic anticoagulation 09/26/2017  ? Atrial fibrillation ([place]) 09/26/2017  ? Atrial flutter ([place]) 02/11/2017  ? Post PTCA 08/09/2016  ? Dyspnea on exertion 08/07/2016  ? Coronary artery disease involving native coronary artery of native heart without angina pectoris 08/07/2016  ? ? ?REFERRING PROVIDER: Rod Can, MD ?  ?REFERRING DIAG: M70.62 (ICD-10-CM) - Trochanteric bursitis, left hip  ?  ?THERAPY DIAG:  ?Muscle weakness (generalized) ?  ?Other lack of coordination ?  ?Pain in left hip ?  ?Difficulty in walking, not elsewhere classified ?  ?ONSET DATE: MD order 04/19/2021 ?  ?SUBJECTIVE:  ?  ?SUBJECTIVE STATEMENT: ?-Pt requires walker to ambulate.  Pt has difficulty and is limited with ambulation.  Pt uses a lift chair at home and has difficulty with sit/stand transfers.  Pt is limited with cooking and preparing food.  Has pain with standing.  ? ?-Pt states he felt good after prior Rx.  Pt states he has been sick for 6-7 days though started feeling better yesterday.  Pt reports compliance with HEP and continued to work on his airdyne bike.  Pt able to perform 2 miles on airdyne yesterday.  Pt states he has improved in everything.  Pt  denies hip pain currently.  ?  ?PERTINENT HISTORY: ?-Parkinson's ;  Hx of bilat knee pain and OA ; neuropathy ; left hip intertrochanteric Fx with femur IM nailing 05/20/2020 ; CKD stage 3. ?-Cardiovascular Hx:  permanent atrial fibrillation with cardioversion in 2018 and 2020, chronic heart failure, coronary artery disease s/p Coronary angioplasty in 2018, complex valvular heart disease,  moderate pulmonary hypertension, venous insufficiency, and Dyspnea on exertion ?  ?PAIN:  ?Are you having pain? "a little" ?NPRS scale: 3-4/10 current in L hip and 7-8/10 in bilat knees ; 10/10 worst ?  ?  ?PRECAUTIONS: Other: Parkinson's.  Pt uses a walker.  Chronic heart failure, A-fib  ?  ?WEIGHT BEARING RESTRICTIONS No ?  ?FALLS:  ?Has patient fallen in last 6 months? No ?  ?LIVING ENVIRONMENT: ?Lives with: lives with their spouse ?Lives in: 1 story home ?Stairs: 1 step to enter/exti home without rail ?Has following equipment at home: [facility], Quad cane, FWW, and lift chair ?  ?OCCUPATION: Pt is retired ?  ?  PLOF: Independent; Pt ambulates with a FWW.   ?  ?PATIENT GOALS improve strength in LE's, walk without walker ?  ?  ?OBJECTIVE:  ?  ?DIAGNOSTIC FINDINGS: N/A ?  ?  ?OBSERVATION:  ?Purple coloring and swelling in R > L distal LE ?  ? ?GAIT: ?Assistive device utilized: Walker - 2 wheeled ?Comments: Pt ambulates with a FWW with bilat knees flexed and fwd flexed/stooped over posture with increased Wb'ing thru UE's. ?  ? ? Standing posture:  Rightward lean with R knee flexed  ?  ?  ?TODAY'S TREATMENT: ?Therapeutic Exercise: ?Reviewed response to prior Rx, pain level, and current function.  ?Pt performed: ? LAQ with 2# 2x10 reps ? Seated HS curls with YTB 2x10 reps ? Seated marching 2x10 reps ? Seated hip abd iso with belt x 10 reps with 5 sec hold ? Sit to stands 2 x 10 reps from hi/low table with UE's on knees ? NuStep at L2 x 5 mins with UE/LE's ? Sidestepping with CGA with Ue's ?See below for pt education ?  ?   ? ?Neuro Re-ed  activities: ?Pt performed: ? Low level marching with UE assistance with CGA 2 x 10 reps ? Standing weight shifts s/s and f/b without UE assistance with min assist ? Standing on airex with min/mod asssist 2

## 2021-05-28 ENCOUNTER — Ambulatory Visit (HOSPITAL_BASED_OUTPATIENT_CLINIC_OR_DEPARTMENT_OTHER): Payer: Medicare Other | Admitting: Physical Therapy

## 2021-05-28 ENCOUNTER — Encounter (HOSPITAL_BASED_OUTPATIENT_CLINIC_OR_DEPARTMENT_OTHER): Payer: Self-pay | Admitting: Physical Therapy

## 2021-05-28 DIAGNOSIS — M25552 Pain in left hip: Secondary | ICD-10-CM | POA: Diagnosis not present

## 2021-05-28 DIAGNOSIS — M6281 Muscle weakness (generalized): Secondary | ICD-10-CM | POA: Diagnosis not present

## 2021-05-28 DIAGNOSIS — R262 Difficulty in walking, not elsewhere classified: Secondary | ICD-10-CM

## 2021-05-28 DIAGNOSIS — R278 Other lack of coordination: Secondary | ICD-10-CM

## 2021-05-28 NOTE — Therapy (Signed)
?OUTPATIENT PHYSICAL THERAPY TREATMENT NOTE ? ? ?Patient Name: [name] ?MRN: [id number] ?DOB:1935-12-12, 86 y.o., male ?Today's Date: 05/28/2021 ? ?PCP: Carol Ada, MD ?REFERRING PROVIDER: Carol Ada, MD ? ? PT End of Session - 05/28/21 1121   ? ? Visit Number 5   ? Number of Visits 12   ? Date for PT Re-Evaluation 06/16/21   ? Authorization Type MCR A and B   ? Progress Note Due on Visit 10   ? PT Start Time 1110   ? PT Stop Time 1150   ? PT Time Calculation (min) 40 min   ? Activity Tolerance Patient tolerated treatment well   ? Behavior During Therapy [facility] for tasks assessed/performed   ? ?  ?  ? ?  ? ? ? ?Past Medical History:  ?Diagnosis Date  ? Arthritis   ? "knees; lower back" (08/09/2016)  ? Atrial fibrillation ([place])   ? remote hx/notes 08/07/2016  ? Coronary artery disease   ? Episodes of trembling   ? "most of the time it's my left hand" (08/09/2016)  ? GI bleeding 08/2017  ? High cholesterol   ? History of kidney stones   ? Hypertension   ? Melanoma of shoulder ([place]) 2000s  ? "left"  ? Mitral regurgitation and mitral stenosis   ? hx/notes 08/07/2016  ? Moderate aortic stenosis   ? hx/notes 08/07/2016  ? Parkinson's disease ([place])   ? Prostate cancer [facility] 2011  ? hx/notes 08/07/2016  ? ?Past Surgical History:  ?Procedure Laterality Date  ? CARDIAC CATHETERIZATION  ~ 2001  ? "[place], [place]"  ? CARDIOVERSION N/A 01/26/2017  ? Procedure: CARDIOVERSION;  Surgeon: [name], MD;  Location: [place];  Service: Cardiovascular;  Laterality: N/A;  ? CARDIOVERSION N/A 03/06/2018  ? Procedure: CARDIOVERSION;  Surgeon: [name], MD;  Location: [facility];  Service: Cardiovascular;  Laterality: N/A;  ? CATARACT EXTRACTION W/ INTRAOCULAR LENS  IMPLANT, BILATERAL Bilateral 2014-02/2016  ? left-right; hx/notes 08/07/2016  ? COLONOSCOPY WITH PROPOFOL N/A 09/25/2017  ? Procedure: COLONOSCOPY WITH PROPOFOL;  Surgeon: Laurence Spates, MD;  Location: [place];  Service: Endoscopy;   Laterality: N/A;  ? CORONARY ANGIOPLASTY WITH STENT PLACEMENT  08/09/2016  ? CORONARY ATHERECTOMY N/A 08/09/2016  ? Procedure: Coronary Atherectomy;  Surgeon: Adrian Prows, MD;  Location: [place] [facility];  Service: Cardiovascular;  Laterality: N/A;  ? CORONARY STENT INTERVENTION N/A 08/09/2016  ? Procedure: Coronary Stent Intervention;  Surgeon: Adrian Prows, MD;  Location: [place] [facility];  Service: Cardiovascular;  Laterality: N/A;  LAD  ? EYE SURGERY Bilateral 05/2016  ? "laser on both lenses"  ? FEMUR IM NAIL Left 05/20/2020  ? Procedure: INTRAMEDULLARY (IM) NAIL FEMORAL;  Surgeon: Rod Can, MD;  [facility];  Service: Orthopedics;  Laterality: Left;  ? HEMORRHOID SURGERY N/A 09/27/2017  ? Procedure: HEMORRHOIDECTOMY;  Surgeon: Erroll Luna, MD;  Location: [place];  Service: General;  Laterality: N/A;  ? INGUINAL HERNIA REPAIR Left 1991  ? INSERTION PROSTATE RADIATION SEED  06/25/2009  ? INTRAVASCULAR PRESSURE WIRE/FFR STUDY N/A 08/09/2016  ? Procedure: Intravascular Pressure Wire/FFR Study;  Surgeon: Adrian Prows, MD;  Location: [place] [facility];  Service: Cardiovascular;  Laterality: N/A;  ? [place]  ? MELANOMA EXCISION Left 1990s  ? "shoulder"  ? MELANOMA EXCISION Right 08/2018  ? right shoulder  ? MELANOMA EXCISION  04/20/2021  ? RIGHT HEART CATH N/A 03/20/2018  ? Procedure: RIGHT HEART CATH;  Surgeon: Vernell Leep  J, MD;  Location: [place] [facility];  Service: Cardiovascular;  Laterality: N/A;  ? RIGHT/LEFT HEART CATH AND CORONARY ANGIOGRAPHY N/A 08/09/2016  ? Procedure: Right/Left Heart Cath and Coronary Angiography;  Surgeon: Adrian Prows, MD;  Location: [place] [facility];  Service: Cardiovascular;  Laterality: N/A;  ? SURGERY FOR BROKEN FEMUR Left 05/24/2020  ? TEE WITHOUT CARDIOVERSION N/A 01/25/2017  ? Procedure: TRANSESOPHAGEAL ECHOCARDIOGRAM (TEE);  Surgeon: [name], MD;  Location: [place];  Service: Cardiovascular;  Laterality: N/A;   ? ?Patient Active Problem List  ? Diagnosis Date Noted  ? Chronic heart failure with preserved ejection fraction ([place] ) 06/17/2020  ? Elevated hemidiaphragm   ? Closed fracture of left femur ([place])   ? Pain   ? Hypokalemia 05/19/2020  ? Dilated cardiomyopathy ([place]) 05/19/2020  ? CKD (chronic kidney disease), stage III ([place]) 05/19/2020  ? Parkinson's disease ([place]) 05/19/2020  ? Closed left hip fracture ([place]) 05/18/2020  ? Intertrochanteric fracture ([place]) 05/18/2020  ? Essential hypertension 07/21/2018  ? Bilateral leg edema 07/21/2018  ? Laboratory examination 05/14/2018  ? Pulmonary hypertension ([place]) 03/16/2018  ? Nonrheumatic mitral valve regurgitation 03/16/2018  ? Tricuspid regurgitation 03/16/2018  ? CKD (chronic kidney disease) stage 3, GFR 30-59 ml/min (HCC) 09/26/2017  ? Chronic anticoagulation 09/26/2017  ? Atrial fibrillation ([place]) 09/26/2017  ? Atrial flutter ([place]) 02/11/2017  ? Post PTCA 08/09/2016  ? Dyspnea on exertion 08/07/2016  ? Coronary artery disease involving native coronary artery of native heart without angina pectoris 08/07/2016  ? ? ?REFERRING PROVIDER: Rod Can, MD ?  ?REFERRING DIAG: M70.62 (ICD-10-CM) - Trochanteric bursitis, left hip  ?  ?THERAPY DIAG:  ?Muscle weakness (generalized) ?  ?Other lack of coordination ?  ?Pain in left hip ?  ?Difficulty in walking, not elsewhere classified ?  ?ONSET DATE: MD order 04/19/2021 ?  ?SUBJECTIVE:  ?  ?SUBJECTIVE STATEMENT: ?-Pt requires walker to ambulate.  Pt has difficulty and is limited with ambulation.  Pt uses a lift chair at home and has difficulty with sit/stand transfers.  Pt is limited with cooking and preparing food.  Has pain with standing.  ? ?-Pt states he felt good after prior Rx.  Pt is wearing compression socks today.  He states it's hard to get the socks on, but does feel better when wearing.  Pt reports compliance with HEP and continued to work on his airdyne bike.  Pt has been performing 2 miles on airdyne.  Pt states he  has improved in everything.  Pt denies hip and knee pain currently.  ? ?  ?PERTINENT HISTORY: ?-Parkinson's ;  Hx of bilat knee pain and OA ; neuropathy ; left hip intertrochanteric Fx with femur IM nailing 05/20/2020 ; CKD stage 3. ?-Cardiovascular Hx:  permanent atrial fibrillation with cardioversion in 2018 and 2020, chronic heart failure, coronary artery disease s/p Coronary angioplasty in 2018, complex valvular heart disease,  moderate pulmonary hypertension, venous insufficiency, and Dyspnea on exertion ?  ?PAIN:  ?Are you having pain? no ?NPRS scale: 0/10 pain ?  ?  ?PRECAUTIONS: Other: Parkinson's.  Pt uses a walker.  Chronic heart failure, A-fib  ?  ?WEIGHT BEARING RESTRICTIONS No ?  ?FALLS:  ?Has patient fallen in last 6 months? No ?  ?LIVING ENVIRONMENT: ?Lives with: lives with their spouse ?Lives in: 1 story home ?Stairs: 1 step to enter/exti home without rail ?Has following equipment at home: [facility], Quad cane, FWW, and lift chair ?  ?OCCUPATION: Pt is retired ?  ?PLOF: Independent;  Pt ambulates with a FWW.   ?  ?PATIENT GOALS improve strength in LE's, walk without walker ?  ?  ?OBJECTIVE:  ?  ?DIAGNOSTIC FINDINGS: N/A ?  ? ?GAIT: ?Assistive device utilized: Environmental consultant - 2 wheeled ?Comments: Pt ambulates with a FWW with bilat knees flexed and fwd flexed/stooped over posture with increased Wb'ing thru UE's. ?  ? ? Standing posture:  Rightward lean with R knee flexed  ?  ?  ?TODAY'S TREATMENT: ?Therapeutic Exercise: ?Reviewed response to prior Rx, pain level, and current function.  ?Pt performed: ? LAQ with 2# 3x10 reps ? Seated HS curls with YTB 2x10 reps ? Seated marching 2x10 reps with 2# on R.  Pt unable to flex L hip with 2# ? Seated hip abd iso with belt 2 x 10 reps with 5 sec hold ? Sit to stands 2 x 10 reps from hi/low table with UE's on knees ? NuStep at L2 x 5 mins with UE/LE's ? Sidestepping with CGA with Ue's ?   ? ?Neuro Re-ed activities: ?Pt performed: ? Low level marching with UE assistance with  CGA 2 x 10 reps ? Alt toe taps on 4 inch step with bilat UE assist and CGA 2x10 reps ? Standing on airex with min asssist 2 x 30 sec without UE support ?  ?  ?  ?PATIENT EDUCATION:  ?Education details:  HEP,

## 2021-05-31 DIAGNOSIS — I129 Hypertensive chronic kidney disease with stage 1 through stage 4 chronic kidney disease, or unspecified chronic kidney disease: Secondary | ICD-10-CM | POA: Diagnosis not present

## 2021-05-31 DIAGNOSIS — I50810 Right heart failure, unspecified: Secondary | ICD-10-CM | POA: Diagnosis not present

## 2021-05-31 DIAGNOSIS — E876 Hypokalemia: Secondary | ICD-10-CM | POA: Diagnosis not present

## 2021-05-31 DIAGNOSIS — N183 Chronic kidney disease, stage 3 unspecified: Secondary | ICD-10-CM | POA: Diagnosis not present

## 2021-06-07 ENCOUNTER — Ambulatory Visit (HOSPITAL_BASED_OUTPATIENT_CLINIC_OR_DEPARTMENT_OTHER): Payer: Medicare Other | Attending: Orthopedic Surgery | Admitting: Physical Therapy

## 2021-06-07 ENCOUNTER — Encounter (HOSPITAL_BASED_OUTPATIENT_CLINIC_OR_DEPARTMENT_OTHER): Payer: Self-pay | Admitting: Physical Therapy

## 2021-06-07 DIAGNOSIS — M6281 Muscle weakness (generalized): Secondary | ICD-10-CM

## 2021-06-07 DIAGNOSIS — M25552 Pain in left hip: Secondary | ICD-10-CM

## 2021-06-07 DIAGNOSIS — R278 Other lack of coordination: Secondary | ICD-10-CM

## 2021-06-07 DIAGNOSIS — R262 Difficulty in walking, not elsewhere classified: Secondary | ICD-10-CM | POA: Diagnosis not present

## 2021-06-07 NOTE — Therapy (Signed)
?OUTPATIENT PHYSICAL THERAPY TREATMENT NOTE ? ? ?Patient Name: [name] ?MRN: [id number] ?DOB:08/14/1935, 86 y.o., male ?Today's Date: 06/08/2021 ? ?PCP: Carol Ada, MD ? ? PT End of Session - 06/07/21 1619   ? ? Visit Number 6   ? Number of Visits 12   ? Date for PT Re-Evaluation 06/16/21   ? Authorization Type MCR A and B   ? PT Start Time 1612   ? PT Stop Time 8185   ? PT Time Calculation (min) 42 min   ? Activity Tolerance Patient tolerated treatment well   ? Behavior During Therapy [facility] for tasks assessed/performed   ? ?  ?  ? ?  ? ? ? ? ?Past Medical History:  ?Diagnosis Date  ? Arthritis   ? "knees; lower back" (08/09/2016)  ? Atrial fibrillation ([place])   ? remote hx/notes 08/07/2016  ? Coronary artery disease   ? Episodes of trembling   ? "most of the time it's my left hand" (08/09/2016)  ? GI bleeding 08/2017  ? High cholesterol   ? History of kidney stones   ? Hypertension   ? Melanoma of shoulder ([place]) 2000s  ? "left"  ? Mitral regurgitation and mitral stenosis   ? hx/notes 08/07/2016  ? Moderate aortic stenosis   ? hx/notes 08/07/2016  ? Parkinson's disease ([place])   ? Prostate cancer [facility]) 2011  ? hx/notes 08/07/2016  ? ?Past Surgical History:  ?Procedure Laterality Date  ? CARDIAC CATHETERIZATION  ~ 2001  ? "[place], [place]"  ? CARDIOVERSION N/A 01/26/2017  ? Procedure: CARDIOVERSION;  Surgeon: [name], MD;  Location: [place];  Service: Cardiovascular;  Laterality: N/A;  ? CARDIOVERSION N/A 03/06/2018  ? Procedure: CARDIOVERSION;  Surgeon: [name], MD;  Location: [facility];  Service: Cardiovascular;  Laterality: N/A;  ? CATARACT EXTRACTION W/ INTRAOCULAR LENS  IMPLANT, BILATERAL Bilateral 2014-02/2016  ? left-right; hx/notes 08/07/2016  ? COLONOSCOPY WITH PROPOFOL N/A 09/25/2017  ? Procedure: COLONOSCOPY WITH PROPOFOL;  Surgeon: Laurence Spates, MD;  Location: [place];  Service: Endoscopy;  Laterality: N/A;  ? CORONARY ANGIOPLASTY WITH STENT PLACEMENT  08/09/2016   ? CORONARY ATHERECTOMY N/A 08/09/2016  ? Procedure: Coronary Atherectomy;  Surgeon: Adrian Prows, MD;  Location: [place] [facility];  Service: Cardiovascular;  Laterality: N/A;  ? CORONARY STENT INTERVENTION N/A 08/09/2016  ? Procedure: Coronary Stent Intervention;  Surgeon: Adrian Prows, MD;  Location: [place] [facility];  Service: Cardiovascular;  Laterality: N/A;  LAD  ? EYE SURGERY Bilateral 05/2016  ? "laser on both lenses"  ? FEMUR IM NAIL Left 05/20/2020  ? Procedure: INTRAMEDULLARY (IM) NAIL FEMORAL;  Surgeon: Rod Can, MD;  [facility];  Service: Orthopedics;  Laterality: Left;  ? HEMORRHOID SURGERY N/A 09/27/2017  ? Procedure: HEMORRHOIDECTOMY;  Surgeon: Erroll Luna, MD;  Location: [place];  Service: General;  Laterality: N/A;  ? INGUINAL HERNIA REPAIR Left 1991  ? INSERTION PROSTATE RADIATION SEED  06/25/2009  ? INTRAVASCULAR PRESSURE WIRE/FFR STUDY N/A 08/09/2016  ? Procedure: Intravascular Pressure Wire/FFR Study;  Surgeon: Adrian Prows, MD;  Location: [place] [facility];  Service: Cardiovascular;  Laterality: N/A;  ? [place]  ? MELANOMA EXCISION Left 1990s  ? "shoulder"  ? MELANOMA EXCISION Right 08/2018  ? right shoulder  ? MELANOMA EXCISION  04/20/2021  ? RIGHT HEART CATH N/A 03/20/2018  ? Procedure: RIGHT HEART CATH;  Surgeon: [name], MD;  Location: [place] [facility];  Service: Cardiovascular;  Laterality:  N/A;  ? RIGHT/LEFT HEART CATH AND CORONARY ANGIOGRAPHY N/A 08/09/2016  ? Procedure: Right/Left Heart Cath and Coronary Angiography;  Surgeon: Adrian Prows, MD;  Location: [place] [facility];  Service: Cardiovascular;  Laterality: N/A;  ? SURGERY FOR BROKEN FEMUR Left 05/24/2020  ? TEE WITHOUT CARDIOVERSION N/A 01/25/2017  ? Procedure: TRANSESOPHAGEAL ECHOCARDIOGRAM (TEE);  Surgeon: [name], MD;  Location: [place];  Service: Cardiovascular;  Laterality: N/A;  ? ?Patient Active Problem List  ? Diagnosis Date Noted  ? Chronic heart  failure with preserved ejection fraction ([place]) 06/17/2020  ? Elevated hemidiaphragm   ? Closed fracture of left femur ([place])   ? Pain   ? Hypokalemia 05/19/2020  ? Dilated cardiomyopathy ([place]) 05/19/2020  ? CKD (chronic kidney disease), stage III ([place]) 05/19/2020  ? Parkinson's disease ([place]) 05/19/2020  ? Closed left hip fracture ([place]) 05/18/2020  ? Intertrochanteric fracture ([place]) 05/18/2020  ? Essential hypertension 07/21/2018  ? Bilateral leg edema 07/21/2018  ? Laboratory examination 05/14/2018  ? Pulmonary hypertension ([place]) 03/16/2018  ? Nonrheumatic mitral valve regurgitation 03/16/2018  ? Tricuspid regurgitation 03/16/2018  ? CKD (chronic kidney disease) stage 3, GFR 30-59 ml/min (H[facility]) 09/26/2017  ? Chronic anticoagulation 09/26/2017  ? Atrial fibrillation ([place]) 09/26/2017  ? Atrial flutter ([place]) 02/11/2017  ? Post PTCA 08/09/2016  ? Dyspnea on exertion 08/07/2016  ? Coronary artery disease involving native coronary artery of native heart without angina pectoris 08/07/2016  ? ? ?REFERRING PROVIDER: Rod Can, MD ?  ?REFERRING DIAG: M70.62 (ICD-10-CM) - Trochanteric bursitis, left hip  ?  ?THERAPY DIAG:  ?Muscle weakness (generalized) ?  ?Other lack of coordination ?  ?Pain in left hip ?  ?Difficulty in walking, not elsewhere classified ?  ?ONSET DATE: MD order 04/19/2021 ?  ?SUBJECTIVE:  ?  ?SUBJECTIVE STATEMENT: ?-Pt requires walker to ambulate.  Pt has difficulty and is limited with ambulation.  Pt uses a lift chair at home and has difficulty with sit/stand transfers.  Pt is limited with cooking and preparing food.  Has pain with standing.  ? ?-Pt is wearing compression socks today.  Pt reports compliance with HEP and continued to work on his airdyne bike.   ?Pt reports he had increased knee pain and fatigue after prior Rx.  Pt states he probably overdid it yesterday with his exercises and had increased pain yesterday.   Pt reports having pain in bilat knees currently though no hip pain.   ? ?   ?PERTINENT HISTORY: ?-Parkinson's ;  Hx of bilat knee pain and OA ; neuropathy ; left hip intertrochanteric Fx with femur IM nailing 05/20/2020 ; CKD stage 3. ?-Cardiovascular Hx:  permanent atrial fibrillation with cardioversion in 2018 and 2020, chronic heart failure, coronary artery disease s/p Coronary angioplasty in 2018, complex valvular heart disease,  moderate pulmonary hypertension, venous insufficiency, and Dyspnea on exertion ?  ?PAIN:  ?Are you having pain? yes ?NPRS scale: 2/10 pain ?  ?  ?PRECAUTIONS: Other: Parkinson's.  Pt uses a walker.  Chronic heart failure, A-fib  ?  ?WEIGHT BEARING RESTRICTIONS No ?  ?FALLS:  ?Has patient fallen in last 6 months? No ?  ?LIVING ENVIRONMENT: ?Lives with: lives with their spouse ?Lives in: 1 story home ?Stairs: 1 step to enter/exti home without rail ?Has following equipment at home: [facility], Quad cane, FWW, and lift chair ?  ?OCCUPATION: Pt is retired ?  ?PLOF: Independent; Pt ambulates with a FWW.   ?  ?PATIENT GOALS improve strength in LE's, walk without walker ?  ?  ?  OBJECTIVE:  ?  ?DIAGNOSTIC FINDINGS: N/A ?  ? ?GAIT: ?Assistive device utilized: Environmental consultant - 2 wheeled ?Comments: Pt ambulates with a FWW with bilat knees flexed and fwd flexed/stooped over posture with increased Wb'ing thru UE's. ?  ? ? Standing posture:  Rightward lean with R knee flexed  ?  ?  ?TODAY'S TREATMENT: ?Therapeutic Exercise: ?Reviewed response to prior Rx, pain level, and current function.  ?Pt performed: ? NuStep at L2 x 5 mins with UE/LE's ?LAQ with 2# 3x10 reps ? Seated marching 2x10 reps with 2# on R.  AROM on L 2x12 reps ? Seated hip abd with YTB x 15 and x 10 on R and 2 x 10 on L ? Sit to stands 2 x 10 reps from hi/low table with UE's on knees ? Sidestepping with CGA with Ue's ?   ? ?Neuro Re-ed activities: ?Pt performed: ? Low level marching on airex with UE assistance with CGA 2 x 10 reps ? Alt toe taps on 4 inch step with bilat UE assist and CGA 2x10 reps ? Standing weight shifts on  airex s/s and f/b with UE assist with min assist ?Standing on airex with min asssist x 30 sec without UE support ?  ?  ?  ?PATIENT EDUCATION:  ?Education details:  HEP, exercise form, rationale of exercises, an

## 2021-06-09 ENCOUNTER — Ambulatory Visit (HOSPITAL_BASED_OUTPATIENT_CLINIC_OR_DEPARTMENT_OTHER): Payer: Medicare Other | Admitting: Physical Therapy

## 2021-06-09 ENCOUNTER — Encounter (HOSPITAL_BASED_OUTPATIENT_CLINIC_OR_DEPARTMENT_OTHER): Payer: Self-pay | Admitting: Physical Therapy

## 2021-06-09 DIAGNOSIS — R262 Difficulty in walking, not elsewhere classified: Secondary | ICD-10-CM

## 2021-06-09 DIAGNOSIS — Z85820 Personal history of malignant melanoma of skin: Secondary | ICD-10-CM | POA: Diagnosis not present

## 2021-06-09 DIAGNOSIS — M6281 Muscle weakness (generalized): Secondary | ICD-10-CM

## 2021-06-09 DIAGNOSIS — Z85828 Personal history of other malignant neoplasm of skin: Secondary | ICD-10-CM | POA: Diagnosis not present

## 2021-06-09 DIAGNOSIS — L853 Xerosis cutis: Secondary | ICD-10-CM | POA: Diagnosis not present

## 2021-06-09 DIAGNOSIS — L570 Actinic keratosis: Secondary | ICD-10-CM | POA: Diagnosis not present

## 2021-06-09 DIAGNOSIS — M25552 Pain in left hip: Secondary | ICD-10-CM

## 2021-06-09 DIAGNOSIS — R278 Other lack of coordination: Secondary | ICD-10-CM

## 2021-06-09 DIAGNOSIS — L821 Other seborrheic keratosis: Secondary | ICD-10-CM | POA: Diagnosis not present

## 2021-06-09 NOTE — Therapy (Signed)
?OUTPATIENT PHYSICAL THERAPY TREATMENT NOTE ? ? ?Patient Name: [name] ?MRN: [id number] ?DOB:[birth year], 86 y.o., male ?Today's Date: 06/09/2021 ? ?PCP: Carol Ada, MD ? ? PT End of Session - 06/09/21 1641   ? ? Visit Number 7   ? Number of Visits 12   ? Date for PT Re-Evaluation 06/16/21   ? Authorization Type MCR A and B   ? Progress Note Due on Visit 10   ? PT Start Time 1612   ? PT Stop Time 1657   ? PT Time Calculation (min) 45 min   ? Activity Tolerance Patient tolerated treatment well   ? Behavior During Therapy [facility] for tasks assessed/performed   ? ?  ?  ? ?  ? ? ? ? ? ?Past Medical History:  ?Diagnosis Date  ? Arthritis   ? "knees; lower back" (08/09/2016)  ? Atrial fibrillation ([place])   ? remote hx/notes 08/07/2016  ? Coronary artery disease   ? Episodes of trembling   ? "most of the time it's my left hand" (08/09/2016)  ? GI bleeding 08/2017  ? High cholesterol   ? History of kidney stones   ? Hypertension   ? Melanoma of shoulder ([place]) 2000s  ? "left"  ? Mitral regurgitation and mitral stenosis   ? hx/notes 08/07/2016  ? Moderate aortic stenosis   ? hx/notes 08/07/2016  ? Parkinson's disease ([place])   ? Prostate cancer [facility]) 2011  ? hx/notes 08/07/2016  ? ?Past Surgical History:  ?Procedure Laterality Date  ? CARDIAC CATHETERIZATION  ~ 2001  ? "[place], [place]"  ? CARDIOVERSION N/A 01/26/2017  ? Procedure: CARDIOVERSION;  Surgeon: [name], MD;  Location: [place];  Service: Cardiovascular;  Laterality: N/A;  ? CARDIOVERSION N/A 03/06/2018  ? Procedure: CARDIOVERSION;  Surgeon: [name], MD;  Location: [place] [facility];  Service: Cardiovascular;  Laterality: N/A;  ? CATARACT EXTRACTION W/ INTRAOCULAR LENS  IMPLANT, BILATERAL Bilateral 2014-02/2016  ? left-right; hx/notes 08/07/2016  ? COLONOSCOPY WITH PROPOFOL N/A 09/25/2017  ? Procedure: COLONOSCOPY WITH PROPOFOL;  Surgeon: Laurence Spates, MD;  Location: [place];  Service: Endoscopy;  Laterality: N/A;  ? CORONARY  ANGIOPLASTY WITH STENT PLACEMENT  08/09/2016  ? CORONARY ATHERECTOMY N/A 08/09/2016  ? Procedure: Coronary Atherectomy;  Surgeon: Adrian Prows, MD;  Location: [place] [facility];  Service: Cardiovascular;  Laterality: N/A;  ? CORONARY STENT INTERVENTION N/A 08/09/2016  ? Procedure: Coronary Stent Intervention;  Surgeon: Adrian Prows, MD;  Location: [place] [facility];  Service: Cardiovascular;  Laterality: N/A;  LAD  ? EYE SURGERY Bilateral 05/2016  ? "laser on both lenses"  ? FEMUR IM NAIL Left 05/20/2020  ? Procedure: INTRAMEDULLARY (IM) NAIL FEMORAL;  Surgeon: Rod Can, MD;  [facility];  Service: Orthopedics;  Laterality: Left;  ? HEMORRHOID SURGERY N/A 09/27/2017  ? Procedure: HEMORRHOIDECTOMY;  Surgeon: Erroll Luna, MD;  Location: [place];  Service: General;  Laterality: N/A;  ? INGUINAL HERNIA REPAIR Left 1991  ? INSERTION PROSTATE RADIATION SEED  06/25/2009  ? INTRAVASCULAR PRESSURE WIRE/FFR STUDY N/A 08/09/2016  ? Procedure: Intravascular Pressure Wire/FFR Study;  Surgeon: Adrian Prows, MD;  Location: [place] [facility];  Service: Cardiovascular;  Laterality: N/A;  ? [place]  ? MELANOMA EXCISION Left 1990s  ? "shoulder"  ? MELANOMA EXCISION Right 08/2018  ? right shoulder  ? MELANOMA EXCISION  04/20/2021  ? RIGHT HEART CATH N/A 03/20/2018  ? Procedure: RIGHT HEART CATH;  Surgeon: [name], MD;  Location: [place] [facility];  Service: Cardiovascular;  Laterality: N/A;  ? RIGHT/LEFT HEART CATH AND CORONARY ANGIOGRAPHY N/A 08/09/2016  ? Procedure: Right/Left Heart Cath and Coronary Angiography;  Surgeon: Adrian Prows, MD;  Location: [place] [facility];  Service: Cardiovascular;  Laterality: N/A;  ? SURGERY FOR BROKEN FEMUR Left 05/24/2020  ? TEE WITHOUT CARDIOVERSION N/A 01/25/2017  ? Procedure: TRANSESOPHAGEAL ECHOCARDIOGRAM (TEE);  Surgeon: [name], MD;  Location: [place];  Service: Cardiovascular;  Laterality: N/A;  ? ?Patient Active Problem  List  ? Diagnosis Date Noted  ? Chronic heart failure with preserved ejection fraction ([place]) 06/17/2020  ? Elevated hemidiaphragm   ? Closed fracture of left femur ([place])   ? Pain   ? Hypokalemia 05/19/2020  ? Dilated cardiomyopathy ([place]) 05/19/2020  ? CKD (chronic kidney disease), stage III ([place]) 05/19/2020  ? Parkinson's disease ([place]) 05/19/2020  ? Closed left hip fracture ([place]) 05/18/2020  ? Intertrochanteric fracture ([place]) 05/18/2020  ? Essential hypertension 07/21/2018  ? Bilateral leg edema 07/21/2018  ? Laboratory examination 05/14/2018  ? Pulmonary hypertension ([place]) 03/16/2018  ? Nonrheumatic mitral valve regurgitation 03/16/2018  ? Tricuspid regurgitation 03/16/2018  ? CKD (chronic kidney disease) stage 3, GFR 30-59 ml/min (HCC) 09/26/2017  ? Chronic anticoagulation 09/26/2017  ? Atrial fibrillation ([place]) 09/26/2017  ? Atrial flutter ([place]) 02/11/2017  ? Post PTCA 08/09/2016  ? Dyspnea on exertion 08/07/2016  ? Coronary artery disease involving native coronary artery of native heart without angina pectoris 08/07/2016  ? ? ?REFERRING PROVIDER: Rod Can, MD ?  ?REFERRING DIAG: M70.62 (ICD-10-CM) - Trochanteric bursitis, left hip  ?  ?THERAPY DIAG:  ?Muscle weakness (generalized) ?  ?Other lack of coordination ?  ?Pain in left hip ?  ?Difficulty in walking, not elsewhere classified ?  ?ONSET DATE: MD order 04/19/2021 ?  ?SUBJECTIVE:  ?  ?SUBJECTIVE STATEMENT: ?-Pt requires walker to ambulate.  Pt has difficulty and is limited with ambulation.  Pt uses a lift chair at home and has difficulty with sit/stand transfers.  Pt is limited with cooking and preparing food.  Has pain with standing.  ? ?-Pt is not wearing compression socks today.  Pt reports compliance with HEP.  He hasn't used his airdyne bike since Sunday.     ?Pt reports he felt good after prior Rx and was a little stiff afterwards.   ? ?  ?PERTINENT HISTORY: ?-Parkinson's ;  Hx of bilat knee pain and OA ; neuropathy ; left hip intertrochanteric  Fx with femur IM nailing 05/20/2020 ; CKD stage 3. ?-Cardiovascular Hx:  permanent atrial fibrillation with cardioversion in 2018 and 2020, chronic heart failure, coronary artery disease s/p Coronary angioplasty in 2018, complex valvular heart disease,  moderate pulmonary hypertension, venous insufficiency, and Dyspnea on exertion ?  ?PAIN:  ?Are you having pain? yes ?NPRS scale: 0/10 pain in hip.  Pt reports slight pain in bilat knees. ?  ?  ?PRECAUTIONS: Other: Parkinson's.  Pt uses a walker.  Chronic heart failure, A-fib  ?  ?WEIGHT BEARING RESTRICTIONS No ?  ?FALLS:  ?Has patient fallen in last 6 months? No ?  ?LIVING ENVIRONMENT: ?Lives with: lives with their spouse ?Lives in: 1 story home ?Stairs: 1 step to enter/exti home without rail ?Has following equipment at home: [facility], Quad cane, FWW, and lift chair ?  ?OCCUPATION: Pt is retired ?  ?PLOF: Independent; Pt ambulates with a FWW.   ?  ?PATIENT GOALS improve strength in LE's, walk without walker ?  ?  ?  OBJECTIVE:  ?  ?DIAGNOSTIC FINDINGS: N/A ?  ? ?GAIT: ?Assistive device utilized: Environmental consultant - 2 wheeled ?Comments: Pt ambulates with a FWW with bilat knees flexed and fwd flexed/stooped over posture with increased Wb'ing thru UE's. ?  ? ? Standing posture:  Rightward lean with R knee flexed  ?  ?  ?TODAY'S TREATMENT: ?Therapeutic Exercise: ?Reviewed response to prior Rx, pain level, and current function.  ?Pt performed: ? NuStep at L2 x 5.5 mins with UE/LE's ?LAQ with 2# 3x10 reps ? Seated marching 2x10 reps with 2# on R.  AROM on L 2x10 reps ? Seated HS curls with YTB 2x10 reps ? Sit to stands  x 10 reps from hi/low table with UE's on knees ? Sidestepping with CGA with Ue's ?   ? ?Neuro Re-ed activities: ?Pt performed: ? Low level marching on airex with UE assistance with CGA 2 x 10 reps and on floor x 10 reps ? Alt toe taps on 4 inch step with bilat UE assist and CGA 2x10 reps ? Standing weight shifts on floor and airex s/s and f/b with UE assist with CGA ?Standing  on airex with CGA/min asssist 2 x 30 sec without UE support ?  ?  ?  ?PATIENT EDUCATION:  ?Education details:  Educated pt in how to put on knee braces and appropriate positioning.  exercise form, rationale of ex

## 2021-06-16 ENCOUNTER — Encounter (HOSPITAL_BASED_OUTPATIENT_CLINIC_OR_DEPARTMENT_OTHER): Payer: Self-pay | Admitting: Physical Therapy

## 2021-06-16 ENCOUNTER — Ambulatory Visit (HOSPITAL_BASED_OUTPATIENT_CLINIC_OR_DEPARTMENT_OTHER): Payer: Medicare Other | Admitting: Physical Therapy

## 2021-06-16 DIAGNOSIS — R262 Difficulty in walking, not elsewhere classified: Secondary | ICD-10-CM | POA: Diagnosis not present

## 2021-06-16 DIAGNOSIS — R278 Other lack of coordination: Secondary | ICD-10-CM | POA: Diagnosis not present

## 2021-06-16 DIAGNOSIS — M6281 Muscle weakness (generalized): Secondary | ICD-10-CM | POA: Diagnosis not present

## 2021-06-16 DIAGNOSIS — M25552 Pain in left hip: Secondary | ICD-10-CM

## 2021-06-16 NOTE — Therapy (Addendum)
?OUTPATIENT PHYSICAL THERAPY TREATMENT NOTE ? ? ?Patient Name: [name] ?MRN: [id number] ?DOB:[birth year], 86 y.o., male ?Today's Date: 06/16/2021 ? ?PCP: Carol Ada, MD ? ? PT End of Session - 06/16/21 1529   ? ? Visit Number 8   ? Number of Visits 12   ? Date for PT Re-Evaluation 06/16/21   ? Authorization Type MCR A and B   ? Progress Note Due on Visit 10   ? PT Start Time 1438   ? PT Stop Time 7829   ? PT Time Calculation (min) 41 min   ? Equipment Utilized During Treatment --   FWW  ? Activity Tolerance Patient tolerated treatment well   ? Behavior During Therapy [facility] for tasks assessed/performed   ? ?  ?  ? ?  ? ? ? ? ? ? ?Past Medical History:  ?Diagnosis Date  ? Arthritis   ? "knees; lower back" (08/09/2016)  ? Atrial fibrillation ([place])   ? remote hx/notes 08/07/2016  ? Coronary artery disease   ? Episodes of trembling   ? "most of the time it's my left hand" (08/09/2016)  ? GI bleeding 08/2017  ? High cholesterol   ? History of kidney stones   ? Hypertension   ? Melanoma of shoulder ([place]) 2000s  ? "left"  ? Mitral regurgitation and mitral stenosis   ? hx/notes 08/07/2016  ? Moderate aortic stenosis   ? hx/notes 08/07/2016  ? Parkinson's disease ([place])   ? Prostate cancer [facility]) 2011  ? hx/notes 08/07/2016  ? ?Past Surgical History:  ?Procedure Laterality Date  ? CARDIAC CATHETERIZATION  ~ 2001  ? "[place], [place]"  ? CARDIOVERSION N/A 01/26/2017  ? Procedure: CARDIOVERSION;  Surgeon: [name], MD;  Location: [place];  Service: Cardiovascular;  Laterality: N/A;  ? CARDIOVERSION N/A 03/06/2018  ? Procedure: CARDIOVERSION;  Surgeon: [name], MD;  Location: [place] [facility];  Service: Cardiovascular;  Laterality: N/A;  ? CATARACT EXTRACTION W/ INTRAOCULAR LENS  IMPLANT, BILATERAL Bilateral 2014-02/2016  ? left-right; hx/notes 08/07/2016  ? COLONOSCOPY WITH PROPOFOL N/A 09/25/2017  ? Procedure: COLONOSCOPY WITH PROPOFOL;  Surgeon: Laurence Spates, MD;  Location: [place];  Service:  Endoscopy;  Laterality: N/A;  ? CORONARY ANGIOPLASTY WITH STENT PLACEMENT  08/09/2016  ? CORONARY ATHERECTOMY N/A 08/09/2016  ? Procedure: Coronary Atherectomy;  Surgeon: Adrian Prows, MD;  Location: [place] [facility];  Service: Cardiovascular;  Laterality: N/A;  ? CORONARY STENT INTERVENTION N/A 08/09/2016  ? Procedure: Coronary Stent Intervention;  Surgeon: Adrian Prows, MD;  Location: [place] [facility];  Service: Cardiovascular;  Laterality: N/A;  LAD  ? EYE SURGERY Bilateral 05/2016  ? "laser on both lenses"  ? FEMUR IM NAIL Left 05/20/2020  ? Procedure: INTRAMEDULLARY (IM) NAIL FEMORAL;  Surgeon: Rod Can, MD;  [facility];  Service: Orthopedics;  Laterality: Left;  ? HEMORRHOID SURGERY N/A 09/27/2017  ? Procedure: HEMORRHOIDECTOMY;  Surgeon: Erroll Luna, MD;  Location: [place];  Service: General;  Laterality: N/A;  ? INGUINAL HERNIA REPAIR Left 1991  ? INSERTION PROSTATE RADIATION SEED  06/25/2009  ? INTRAVASCULAR PRESSURE WIRE/FFR STUDY N/A 08/09/2016  ? Procedure: Intravascular Pressure Wire/FFR Study;  Surgeon: Adrian Prows, MD;  Location: [place] [facility];  Service: Cardiovascular;  Laterality: N/A;  ? [place]  ? MELANOMA EXCISION Left 1990s  ? "shoulder"  ? MELANOMA EXCISION Right 08/2018  ? right shoulder  ? MELANOMA EXCISION  04/20/2021  ? RIGHT HEART CATH N/A 03/20/2018  ?  Procedure: RIGHT HEART CATH;  Surgeon: [name], MD;  Location: [place] [facility];  Service: Cardiovascular;  Laterality: N/A;  ? RIGHT/LEFT HEART CATH AND CORONARY ANGIOGRAPHY N/A 08/09/2016  ? Procedure: Right/Left Heart Cath and Coronary Angiography;  Surgeon: Adrian Prows, MD;  Location: [place] [facility];  Service: Cardiovascular;  Laterality: N/A;  ? SURGERY FOR BROKEN FEMUR Left 05/24/2020  ? TEE WITHOUT CARDIOVERSION N/A 01/25/2017  ? Procedure: TRANSESOPHAGEAL ECHOCARDIOGRAM (TEE);  Surgeon: [name], MD;  Location: [place];  Service: Cardiovascular;   Laterality: N/A;  ? ?Patient Active Problem List  ? Diagnosis Date Noted  ? Chronic heart failure with preserved ejection fraction ([place]) 06/17/2020  ? Elevated hemidiaphragm   ? Closed fracture of left femur ([employer] [place])   ? Pain   ? Hypokalemia 05/19/2020  ? Dilated cardiomyopathy ([place]) 05/19/2020  ? CKD (chronic kidney disease), stage III ([place]) 05/19/2020  ? Parkinson's disease ([place]) 05/19/2020  ? Closed left hip fracture ([place]) 05/18/2020  ? Intertrochanteric fracture ([place]) 05/18/2020  ? Essential hypertension 07/21/2018  ? Bilateral leg edema 07/21/2018  ? Laboratory examination 05/14/2018  ? Pulmonary hypertension ([place]) 03/16/2018  ? Nonrheumatic mitral valve regurgitation 03/16/2018  ? Tricuspid regurgitation 03/16/2018  ? CKD (chronic kidney disease) stage 3, GFR 30-59 ml/min (HCC) 09/26/2017  ? Chronic anticoagulation 09/26/2017  ? Atrial fibrillation ([place]) 09/26/2017  ? Atrial flutter ([place]) 02/11/2017  ? Post PTCA 08/09/2016  ? Dyspnea on exertion 08/07/2016  ? Coronary artery disease involving native coronary artery of native heart without angina pectoris 08/07/2016  ? ? ?REFERRING PROVIDER: Rod Can, MD ?  ?REFERRING DIAG: M70.62 (ICD-10-CM) - Trochanteric bursitis, left hip  ?  ?THERAPY DIAG:  ?Muscle weakness (generalized) ?  ?Other lack of coordination ?  ?Pain in left hip ?  ?Difficulty in walking, not elsewhere classified ?  ?ONSET DATE: MD order 04/19/2021 ?  ?SUBJECTIVE:  ?  ?SUBJECTIVE STATEMENT: ?-Pt requires walker to ambulate.  Pt has difficulty and is limited with ambulation.  Pt uses a lift chair at home and has difficulty with sit/stand transfers.  Pt is limited with cooking and preparing food.  Has pain with standing.  ? ?-Pt is not wearing compression socks today.  Pt and girlfriend states he he has been having issues with retaining water and has increased fluid in his legs.  They have contacted MD.  Pt reports compliance with HEP.  Pt denies any adverse effects after prior Rx.  Pt states he  had a bad day Sunday.  He couldn't walk well and was very limited LE mobility.  Pt states he was able to stand some at home without using UE's which he was unable to de before starting PT.   ? ?  ?PERTINENT HISTORY: ?-Parkinson's ;  Hx of bilat knee pain and OA ; neuropathy ; left hip intertrochanteric Fx with femur IM nailing 05/20/2020 ; CKD stage 3. ?-Cardiovascular Hx:  permanent atrial fibrillation with cardioversion in 2018 and 2020, chronic heart failure, coronary artery disease s/p Coronary angioplasty in 2018, complex valvular heart disease,  moderate pulmonary hypertension, venous insufficiency, and Dyspnea on exertion ?  ?PAIN:  ?Are you having pain? no ?NPRS scale: 0/10 pain in hip and bilat knees ?  ?  ?PRECAUTIONS: Other: Parkinson's.  Pt uses a walker.  Chronic heart failure, A-fib  ?  ?WEIGHT BEARING RESTRICTIONS No ?  ?FALLS:  ?Has patient fallen in last 6 months? No ?  ? ?PLOF: Independent; Pt ambulates with a FWW.   ?  ?  PATIENT GOALS improve strength in LE's, walk without walker ?  ?  ?OBJECTIVE:  ?  ?DIAGNOSTIC FINDINGS: N/A ?  ? ?GAIT: ?Assistive device utilized: Environmental consultant - 2 wheeled ?Comments: Pt ambulates with a FWW with bilat knees flexed and fwd flexed/stooped over posture with increased Wb'ing thru UE's. ?  ? ? Standing posture:  Rightward lean with R knee flexed  ?  ?  ?TODAY'S TREATMENT: ?Therapeutic Exercise: ?Reviewed response to prior Rx, pain level, and current function.  ?Pt performed: ? NuStep at L2-3 x 5 mins with UE/LE's ?LAQ with 2# 3x10 reps ? Seated marching x25 reps with 2# on R.  AROM on L 2x12 reps ? Seated HS curls with RTB 2x10 on R and with YTB 2x10 on L reps ? Sit to stands 2  x 10 reps from hi/low table with UE's on knees ? Sidestepping with CGA with Ue's ?   ? ?Neuro Re-ed activities: ?Pt performed: ? Low level marching on airex with UE assistance with CGA 2 x 10 reps  ? Alt toe taps on 4 inch step with bilat UE assist and CGA 2x10 reps ? Standing weight shifts on floor  and airex s/s and f/b with UE assist with CGA ?Standing on airex with CGA 2 x 30 sec and 1x15sec without UE support ?  ?  ?  ?PATIENT EDUCATION:  ?Education details:  Educated pt in how to put on knee brace

## 2021-06-22 ENCOUNTER — Ambulatory Visit (HOSPITAL_BASED_OUTPATIENT_CLINIC_OR_DEPARTMENT_OTHER): Payer: Medicare Other | Admitting: Physical Therapy

## 2021-06-22 DIAGNOSIS — M25552 Pain in left hip: Secondary | ICD-10-CM

## 2021-06-22 DIAGNOSIS — R278 Other lack of coordination: Secondary | ICD-10-CM | POA: Diagnosis not present

## 2021-06-22 DIAGNOSIS — R262 Difficulty in walking, not elsewhere classified: Secondary | ICD-10-CM | POA: Diagnosis not present

## 2021-06-22 DIAGNOSIS — M6281 Muscle weakness (generalized): Secondary | ICD-10-CM | POA: Diagnosis not present

## 2021-06-22 NOTE — Therapy (Addendum)
?OUTPATIENT PHYSICAL THERAPY TREATMENT NOTE ? ?Progress Note ?Reporting Period 05/05/2021 to 06/22/2021 ? ?See note below for Objective Data and Assessment of Progress/Goals.  ? ? ? ? ? ?Patient Name: [name] ?MRN: [id number] ?DOB:[birth year], 86 y.o., male ?Today's Date: 06/23/2021 ? ?PCP: Carol Ada, MD ? ? PT End of Session - 06/22/21 1244   ? ? Visit Number 9   ? Number of Visits 21   ? Date for PT Re-Evaluation 08/03/21   ? Authorization Type MCR A and B   ? Progress Note Due on Visit 19   ? PT Start Time 1154   ? PT Stop Time 1234   ? PT Time Calculation (min) 40 min   ? Activity Tolerance Patient tolerated treatment well   ? Behavior During Therapy [facility] for tasks assessed/performed   ? ?  ?  ? ?  ? ? ? ? ? ? ? ?Past Medical History:  ?Diagnosis Date  ? Arthritis   ? "knees; lower back" (08/09/2016)  ? Atrial fibrillation ([place])   ? remote hx/notes 08/07/2016  ? Coronary artery disease   ? Episodes of trembling   ? "most of the time it's my left hand" (08/09/2016)  ? GI bleeding 08/2017  ? High cholesterol   ? History of kidney stones   ? Hypertension   ? Melanoma of shoulder ([place]) 2000s  ? "left"  ? Mitral regurgitation and mitral stenosis   ? hx/notes 08/07/2016  ? Moderate aortic stenosis   ? hx/notes 08/07/2016  ? Parkinson's disease ([place])   ? Prostate cancer [facility]) 2011  ? hx/notes 08/07/2016  ? ?Past Surgical History:  ?Procedure Laterality Date  ? CARDIAC CATHETERIZATION  ~ 2001  ? "[place], [place]"  ? CARDIOVERSION N/A 01/26/2017  ? Procedure: CARDIOVERSION;  Surgeon: [name], MD;  Location: [place];  Service: Cardiovascular;  Laterality: N/A;  ? CARDIOVERSION N/A 03/06/2018  ? Procedure: CARDIOVERSION;  Surgeon: [name], MD;  Location: [place] [facility];  Service: Cardiovascular;  Laterality: N/A;  ? CATARACT EXTRACTION W/ INTRAOCULAR LENS  IMPLANT, BILATERAL Bilateral 2014-02/2016  ? left-right; hx/notes 08/07/2016  ? COLONOSCOPY WITH PROPOFOL N/A 09/25/2017  ? Procedure:  COLONOSCOPY WITH PROPOFOL;  Surgeon: Laurence Spates, MD;  Location: [place];  Service: Endoscopy;  Laterality: N/A;  ? CORONARY ANGIOPLASTY WITH STENT PLACEMENT  08/09/2016  ? CORONARY ATHERECTOMY N/A 08/09/2016  ? Procedure: Coronary Atherectomy;  Surgeon: Adrian Prows, MD;  Location: [place] [facility];  Service: Cardiovascular;  Laterality: N/A;  ? CORONARY STENT INTERVENTION N/A 08/09/2016  ? Procedure: Coronary Stent Intervention;  Surgeon: Adrian Prows, MD;  Location: [place] [facility];  Service: Cardiovascular;  Laterality: N/A;  LAD  ? EYE SURGERY Bilateral 05/2016  ? "laser on both lenses"  ? FEMUR IM NAIL Left 05/20/2020  ? Procedure: INTRAMEDULLARY (IM) NAIL FEMORAL;  Surgeon: Rod Can, MD;  [facility];  Service: Orthopedics;  Laterality: Left;  ? HEMORRHOID SURGERY N/A 09/27/2017  ? Procedure: HEMORRHOIDECTOMY;  Surgeon: Erroll Luna, MD;  Location: [place];  Service: General;  Laterality: N/A;  ? INGUINAL HERNIA REPAIR Left 1991  ? INSERTION PROSTATE RADIATION SEED  06/25/2009  ? INTRAVASCULAR PRESSURE WIRE/FFR STUDY N/A 08/09/2016  ? Procedure: Intravascular Pressure Wire/FFR Study;  Surgeon: Adrian Prows, MD;  Location: [place] [facility];  Service: Cardiovascular;  Laterality: N/A;  ? [place]  ? MELANOMA EXCISION Left 1990s  ? "shoulder"  ? MELANOMA EXCISION Right 08/2018  ? right shoulder  ?  MELANOMA EXCISION  04/20/2021  ? RIGHT HEART CATH N/A 03/20/2018  ? Procedure: RIGHT HEART CATH;  Surgeon: [name], MD;  Location: [place] [facility];  Service: Cardiovascular;  Laterality: N/A;  ? RIGHT/LEFT HEART CATH AND CORONARY ANGIOGRAPHY N/A 08/09/2016  ? Procedure: Right/Left Heart Cath and Coronary Angiography;  Surgeon: Adrian Prows, MD;  Location: [place] [facility];  Service: Cardiovascular;  Laterality: N/A;  ? SURGERY FOR BROKEN FEMUR Left 05/24/2020  ? TEE WITHOUT CARDIOVERSION N/A 01/25/2017  ? Procedure: TRANSESOPHAGEAL ECHOCARDIOGRAM (TEE);   Surgeon: [name], MD;  Location: [place];  Service: Cardiovascular;  Laterality: N/A;  ? ?Patient Active Problem List  ? Diagnosis Date Noted  ? Chronic heart failure with preserved ejection fraction ([place]) 06/17/2020  ? Elevated hemidiaphragm   ? Closed fracture of left femur ([place])   ? Pain   ? Hypokalemia 05/19/2020  ? Dilated cardiomyopathy ([place]) 05/19/2020  ? CKD (chronic kidney disease), stage III ([place]) 05/19/2020  ? Parkinson's disease ([place]) 05/19/2020  ? Closed left hip fracture ([place]) 05/18/2020  ? Intertrochanteric fracture ([place]) 05/18/2020  ? Essential hypertension 07/21/2018  ? Bilateral leg edema 07/21/2018  ? Laboratory examination 05/14/2018  ? Pulmonary hypertension ([place]) 03/16/2018  ? Nonrheumatic mitral valve regurgitation 03/16/2018  ? Tricuspid regurgitation 03/16/2018  ? CKD (chronic kidney disease) stage 3, GFR 30-59 ml/min ([facility]) 09/26/2017  ? Chronic anticoagulation 09/26/2017  ? Atrial fibrillation ([place]) 09/26/2017  ? Atrial flutter ([place]) 02/11/2017  ? Post PTCA 08/09/2016  ? Dyspnea on exertion 08/07/2016  ? Coronary artery disease involving native coronary artery of native heart without angina pectoris 08/07/2016  ? ? ?REFERRING PROVIDER: Rod Can, MD ?  ?REFERRING DIAG: M70.62 (ICD-10-CM) - Trochanteric bursitis, left hip  ?  ?THERAPY DIAG:  ?Muscle weakness (generalized) ?  ?Other lack of coordination ?  ?Pain in left hip ?  ?Difficulty in walking, not elsewhere classified ?  ?ONSET DATE: MD order 04/19/2021 ?  ?SUBJECTIVE:  ?  ?SUBJECTIVE STATEMENT: ?-Pt is wearing compression socks today.  Pt and girlfriend states he he has been having issues with retaining water and has increased fluid in his legs.  They have contacted MD.  Pt reports compliance with HEP.  Pt denies any adverse effects after prior Rx.  Pt's legs have been swelling which affects his walking.  He is having more difficulty with walking due to leg swelling.   ? ?FUNCTIONAL IMPROVEMENTS:  increased  ambulating in home and ambulating outside some.  Sit/stand transfers.  Pt states he was able to stand some at home without using UE's which he was unable to do before starting PT.  Hip pain.  Pt reports 70-80% improvement in mobility.  Pt reports 70% reduction in hip pain with standing activities and mobility.   ?FUNCTIONAL LIMITATIONS:  ambulation.  He requires walker to ambulate.  Dragging his feet with ambulation.  limited in preparing food.  Hip pain and bilat knee pain with standing.  Pt uses a lift chair at home and has difficulty with sit/stand transfers. ? ?  ?PERTINENT HISTORY: ?-Parkinson's ;  Hx of bilat knee pain and OA ; neuropathy ; left hip intertrochanteric Fx with femur IM nailing 05/20/2020 ; CKD stage 3. ?-Cardiovascular Hx:  permanent atrial fibrillation with cardioversion in 2018 and 2020, chronic heart failure, coronary artery disease s/p Coronary angioplasty in 2018, complex valvular heart disease,  moderate pulmonary hypertension, venous insufficiency, and Dyspnea on exertion ?  ?PAIN:  ?Are you having pain? no ?NPRS  scale: 0/10 pain in hip and bilat knees ?  ?  ?PRECAUTIONS: Other: Parkinson's.  Pt uses a walker.  Chronic heart failure, A-fib  ?  ?WEIGHT BEARING RESTRICTIONS No ?  ?FALLS:  ?Has patient fallen in last 6 months? No ?  ? ?PLOF: Independent; Pt ambulates with a FWW.   ?  ?PATIENT GOALS improve strength in LE's, walk without walker ?  ?  ?OBJECTIVE:  ?  ?DIAGNOSTIC FINDINGS: N/A ?  ?TODAY'S TREATMENT: ? ?PHYSICAL PERFORMANCE TESTING: ? ?PATIENT SURVEYS:  ?FOTO 49 with a goal of 55 at visit #16.  Prior 31 ?GAIT: ?Assistive device utilized: Environmental consultant - 2 wheeled ?Comments: Pt ambulates with a FWW with bilat knees flexed and fwd flexed/stooped over posture with increased Wb'ing thru UE's. ?  ? ? Standing posture:  Rightward lean with R knee flexed  ?  ?  ? ?Hip flexion: R:  4+/5, L:  4-/5 ?Hip abduction  R: weak though has improved 7.8 , L:  weak, 5.6 ?Hip ER:  R:  4/5, L:  tol mild  resistance ?Pt is very limited with L hip ER and is limited with R hip ER ?Knee extension:  5/5 w/n available ROM, 15 Lacks 21 deg of ext; ; L:  4+/5 w/n available ROM, 19.2.  Lacks 28 deg of ext ?Knee flexion:

## 2021-06-23 ENCOUNTER — Encounter (HOSPITAL_BASED_OUTPATIENT_CLINIC_OR_DEPARTMENT_OTHER): Payer: Self-pay | Admitting: Physical Therapy

## 2021-06-24 ENCOUNTER — Ambulatory Visit (HOSPITAL_BASED_OUTPATIENT_CLINIC_OR_DEPARTMENT_OTHER): Payer: Medicare Other | Admitting: Physical Therapy

## 2021-06-24 DIAGNOSIS — R278 Other lack of coordination: Secondary | ICD-10-CM

## 2021-06-24 DIAGNOSIS — M6281 Muscle weakness (generalized): Secondary | ICD-10-CM | POA: Diagnosis not present

## 2021-06-24 DIAGNOSIS — R262 Difficulty in walking, not elsewhere classified: Secondary | ICD-10-CM | POA: Diagnosis not present

## 2021-06-24 DIAGNOSIS — M25552 Pain in left hip: Secondary | ICD-10-CM | POA: Diagnosis not present

## 2021-06-24 NOTE — Therapy (Signed)
?OUTPATIENT PHYSICAL THERAPY TREATMENT NOTE ? ? ? ? ? ?Patient Name: [name] ?MRN: [id number] ?DOB:19-Feb-1936, 86 y.o., male ?Today's Date: 06/25/2021 ? ?PCP: Carol Ada, MD ? ? PT End of Session - 06/24/21 1248   ? ? Visit Number 10   ? Number of Visits 21   ? Date for PT Re-Evaluation 08/03/21   ? Authorization Type MCR A and B   ? Progress Note Due on Visit 19   ? PT Start Time 1155   ? PT Stop Time 1238   ? PT Time Calculation (min) 43 min   ? Equipment Utilized During Treatment Gait belt   FWW  ? Activity Tolerance Patient tolerated treatment well   ? Behavior During Therapy [facility] for tasks assessed/performed   ? ?  ?  ? ?  ? ? ? ? ? ? ? ? ?Past Medical History:  ?Diagnosis Date  ? Arthritis   ? "knees; lower back" (08/09/2016)  ? Atrial fibrillation ([place])   ? remote hx/notes 08/07/2016  ? Coronary artery disease   ? Episodes of trembling   ? "most of the time it's my left hand" (08/09/2016)  ? GI bleeding 08/2017  ? High cholesterol   ? History of kidney stones   ? Hypertension   ? Melanoma of shoulder ([place]) 2000s  ? "left"  ? Mitral regurgitation and mitral stenosis   ? hx/notes 08/07/2016  ? Moderate aortic stenosis   ? hx/notes 08/07/2016  ? Parkinson's disease ([place])   ? Prostate cancer [facility]) 2011  ? hx/notes 08/07/2016  ? ?Past Surgical History:  ?Procedure Laterality Date  ? CARDIAC CATHETERIZATION  ~ 2001  ? "[place], [place]"  ? CARDIOVERSION N/A 01/26/2017  ? Procedure: CARDIOVERSION;  Surgeon: [name], MD;  Location: [place];  Service: Cardiovascular;  Laterality: N/A;  ? CARDIOVERSION N/A 03/06/2018  ? Procedure: CARDIOVERSION;  Surgeon: [name], MD;  Location: [place] [facility];  Service: Cardiovascular;  Laterality: N/A;  ? CATARACT EXTRACTION W/ INTRAOCULAR LENS  IMPLANT, BILATERAL Bilateral 2014-02/2016  ? left-right; hx/notes 08/07/2016  ? COLONOSCOPY WITH PROPOFOL N/A 09/25/2017  ? Procedure: COLONOSCOPY WITH PROPOFOL;  Surgeon: Laurence Spates, MD;  Location: [place];  Service: Endoscopy;  Laterality: N/A;  ? CORONARY ANGIOPLASTY WITH STENT PLACEMENT  08/09/2016  ? CORONARY ATHERECTOMY N/A 08/09/2016  ? Procedure: Coronary Atherectomy;  Surgeon: Adrian Prows, MD;  Location: [place] [facility];  Service: Cardiovascular;  Laterality: N/A;  ? CORONARY STENT INTERVENTION N/A 08/09/2016  ? Procedure: Coronary Stent Intervention;  Surgeon: Adrian Prows, MD;  Location: [place] [facility];  Service: Cardiovascular;  Laterality: N/A;  LAD  ? EYE SURGERY Bilateral 05/2016  ? "laser on both lenses"  ? FEMUR IM NAIL Left 05/20/2020  ? Procedure: INTRAMEDULLARY (IM) NAIL FEMORAL;  Surgeon: Rod Can, MD;  [facility];  Service: Orthopedics;  Laterality: Left;  ? HEMORRHOID SURGERY N/A 09/27/2017  ? Procedure: HEMORRHOIDECTOMY;  Surgeon: Erroll Luna, MD;  Location: [place];  Service: General;  Laterality: N/A;  ? INGUINAL HERNIA REPAIR Left 1991  ? INSERTION PROSTATE RADIATION SEED  06/25/2009  ? INTRAVASCULAR PRESSURE WIRE/FFR STUDY N/A 08/09/2016  ? Procedure: Intravascular Pressure Wire/FFR Study;  Surgeon: Adrian Prows, MD;  Location: [place] [facility];  Service: Cardiovascular;  Laterality: N/A;  ? [place]  ? MELANOMA EXCISION Left 1990s  ? "shoulder"  ? MELANOMA EXCISION Right 08/2018  ? right shoulder  ? MELANOMA EXCISION  04/20/2021  ? RIGHT  HEART CATH N/A 03/20/2018  ? Procedure: RIGHT HEART CATH;  Surgeon: [name], MD;  Location: [place] [facility];  Service: Cardiovascular;  Laterality: N/A;  ? RIGHT/LEFT HEART CATH AND CORONARY ANGIOGRAPHY N/A 08/09/2016  ? Procedure: Right/Left Heart Cath and Coronary Angiography;  Surgeon: Adrian Prows, MD;  Location: [place] [facility];  Service: Cardiovascular;  Laterality: N/A;  ? SURGERY FOR BROKEN FEMUR Left 05/24/2020  ? TEE WITHOUT CARDIOVERSION N/A 01/25/2017  ? Procedure: TRANSESOPHAGEAL ECHOCARDIOGRAM (TEE);  Surgeon: [name], MD;  Location: [place];  Service:  Cardiovascular;  Laterality: N/A;  ? ?Patient Active Problem List  ? Diagnosis Date Noted  ? Chronic heart failure with preserved ejection fraction ([place]) 06/17/2020  ? Elevated hemidiaphragm   ? Closed fracture of left femur ([place])   ? Pain   ? Hypokalemia 05/19/2020  ? Dilated cardiomyopathy ([name]) 05/19/2020  ? CKD (chronic kidney disease), stage III ([place]) 05/19/2020  ? Parkinson's disease ([place]) 05/19/2020  ? Closed left hip fracture ([place]) 05/18/2020  ? Intertrochanteric fracture ([place]) 05/18/2020  ? Essential hypertension 07/21/2018  ? Bilateral leg edema 07/21/2018  ? Laboratory examination 05/14/2018  ? Pulmonary hypertension ([place]) 03/16/2018  ? Nonrheumatic mitral valve regurgitation 03/16/2018  ? Tricuspid regurgitation 03/16/2018  ? CKD (chronic kidney disease) stage 3, GFR 30-59 ml/min ([facility]) 09/26/2017  ? Chronic anticoagulation 09/26/2017  ? Atrial fibrillation ([place]) 09/26/2017  ? Atrial flutter ([place]) 02/11/2017  ? Post PTCA 08/09/2016  ? Dyspnea on exertion 08/07/2016  ? Coronary artery disease involving native coronary artery of native heart without angina pectoris 08/07/2016  ? ? ?REFERRING PROVIDER: Rod Can, MD ?  ?REFERRING DIAG: M70.62 (ICD-10-CM) - Trochanteric bursitis, left hip  ?  ?THERAPY DIAG:  ?Muscle weakness (generalized) ?  ?Other lack of coordination ?  ?Pain in left hip ?  ?Difficulty in walking, not elsewhere classified ?  ?ONSET DATE: MD order 04/19/2021 ?  ?SUBJECTIVE:  ?  ?SUBJECTIVE STATEMENT: ?-Pt is wearing compression socks today.  Pt states his leg swelling is a little better. ? ?FUNCTIONAL IMPROVEMENTS:  increased ambulating in home and ambulating outside some.  Sit/stand transfers.  Pt states he was able to stand some at home without using UE's which he was unable to do before starting PT.  Hip pain.  Pt reports 70-80% improvement in mobility.  Pt reports 70% reduction in hip pain with standing activities and mobility.   ?FUNCTIONAL LIMITATIONS:  ambulation.  He requires  walker to ambulate.  Dragging his feet with ambulation.  limited in preparing food.  Hip pain and bilat knee pain with standing.  Pt uses a lift chair at home and has difficulty with sit/stand transfers. ? ?  ?PERTINENT HISTORY: ?-Parkinson's ;  Hx of bilat knee pain and OA ; neuropathy ; left hip intertrochanteric Fx with femur IM nailing 05/20/2020 ; CKD stage 3. ?-Cardiovascular Hx:  permanent atrial fibrillation with cardioversion in 2018 and 2020, chronic heart failure, coronary artery disease s/p Coronary angioplasty in 2018, complex valvular heart disease,  moderate pulmonary hypertension, venous insufficiency, and Dyspnea on exertion ?  ?PAIN:  ?Are you having pain? no ?NPRS scale: 3/10 pain in hip and bilat knees ?  ?  ?PRECAUTIONS: Other: Parkinson's.  Pt uses a walker.  Chronic heart failure, A-fib  ?  ?WEIGHT BEARING RESTRICTIONS No ?  ?FALLS:  ?Has patient fallen in last 6 months? No ?  ? ?PLOF: Independent; Pt ambulates with a FWW.   ?  ?PATIENT GOALS improve strength  in LE's, walk without walker ?  ?  ?OBJECTIVE:  ?  ?DIAGNOSTIC FINDINGS: N/A ?  ?TODAY'S TREATMENT: ? ?GAIT: ?Assistive device utilized: Environmental consultant - 2 wheeled ?Comments: Pt ambulates with a FWW with bilat knees flexed and fwd flexed/stooped over posture with increased Wb'ing thru UE's. ?  ? ? Standing posture:  Rightward lean with R knee flexed  ?  ?Therapeutic Exercise: ?Reviewed response to prior Rx, pain level, and current function.  ?Pt performed: ?           NuStep at L2-3 x 5 mins with UE/LE's ?LAQ with 2# 3x10 reps ?           Seated marching 3x10 with 3# on R and with 1# on L  ?           Seated HS curls with RTB 2x10 on R and with YTB and RTB x 10 each on L ?           Sit to stands 2  x 10 reps from hi/low table with UE's on knees ?            Sidestepping with CGA with Ue's ?   ?  ?Neuro Re-ed activities: ?Pt performed: ?            Low level marching on airex with UE assistance with CGA 2 x 10 reps  ?            Alt toe taps on 4  inch step with bilat UE assist and CGA 2x10 reps ?            Standing weight shifts on floor and airex s/s and f/b with UE assist with CGA ?Standing on floor with CGA x 1 min, x 45 sec and on airex x15sec w

## 2021-06-25 ENCOUNTER — Encounter (HOSPITAL_BASED_OUTPATIENT_CLINIC_OR_DEPARTMENT_OTHER): Payer: Self-pay | Admitting: Physical Therapy

## 2021-06-29 ENCOUNTER — Encounter (HOSPITAL_BASED_OUTPATIENT_CLINIC_OR_DEPARTMENT_OTHER): Payer: Self-pay | Admitting: Physical Therapy

## 2021-06-29 ENCOUNTER — Ambulatory Visit (HOSPITAL_BASED_OUTPATIENT_CLINIC_OR_DEPARTMENT_OTHER): Payer: Medicare Other | Attending: Orthopedic Surgery | Admitting: Physical Therapy

## 2021-06-29 DIAGNOSIS — R278 Other lack of coordination: Secondary | ICD-10-CM

## 2021-06-29 DIAGNOSIS — M6281 Muscle weakness (generalized): Secondary | ICD-10-CM | POA: Diagnosis not present

## 2021-06-29 DIAGNOSIS — M25552 Pain in left hip: Secondary | ICD-10-CM

## 2021-06-29 DIAGNOSIS — R262 Difficulty in walking, not elsewhere classified: Secondary | ICD-10-CM

## 2021-06-29 NOTE — Therapy (Signed)
?OUTPATIENT PHYSICAL THERAPY TREATMENT NOTE ? ? ? ? ? ?Patient Name: [name] ?MRN: [id number] ?DOB:Oct 18, 1935, 86 y.o., male ?Today's Date: 06/30/2021 ? ?PCP: Carol Ada, MD ? ? PT End of Session - 06/29/21 1203   ? ? Visit Number 11   ? Number of Visits 21   ? Date for PT Re-Evaluation 08/03/21   ? Authorization Type MCR A and B   ? Progress Note Due on Visit 19   ? PT Start Time 1157   ? PT Stop Time 1240   ? PT Time Calculation (min) 43 min   ? Equipment Utilized During Treatment Gait belt   FWW  ? Activity Tolerance Patient tolerated treatment well   ? Behavior During Therapy [facility] for tasks assessed/performed   ? ?  ?  ? ?  ? ? ? ? ? ? ? ? ? ?Past Medical History:  ?Diagnosis Date  ? Arthritis   ? "knees; lower back" (08/09/2016)  ? Atrial fibrillation ([place])   ? remote hx/notes 08/07/2016  ? Coronary artery disease   ? Episodes of trembling   ? "most of the time it's my left hand" (08/09/2016)  ? GI bleeding 08/2017  ? High cholesterol   ? History of kidney stones   ? Hypertension   ? Melanoma of shoulder ([place]) 2000s  ? "left"  ? Mitral regurgitation and mitral stenosis   ? hx/notes 08/07/2016  ? Moderate aortic stenosis   ? hx/notes 08/07/2016  ? Parkinson's disease ([place])   ? Prostate cancer [facility]) 2011  ? hx/notes 08/07/2016  ? ?Past Surgical History:  ?Procedure Laterality Date  ? CARDIAC CATHETERIZATION  ~ 2001  ? "[place], [place]"  ? CARDIOVERSION N/A 01/26/2017  ? Procedure: CARDIOVERSION;  Surgeon: [name], MD;  Location: [place];  Service: Cardiovascular;  Laterality: N/A;  ? CARDIOVERSION N/A 03/06/2018  ? Procedure: CARDIOVERSION;  Surgeon: [name], MD;  Location: [place] [facility];  Service: Cardiovascular;  Laterality: N/A;  ? CATARACT EXTRACTION W/ INTRAOCULAR LENS  IMPLANT, BILATERAL Bilateral 2014-02/2016  ? left-right; hx/notes 08/07/2016  ? COLONOSCOPY WITH PROPOFOL N/A 09/25/2017  ? Procedure: COLONOSCOPY WITH PROPOFOL;  Surgeon: Laurence Spates, MD;  Location: [place];  Service: Endoscopy;  Laterality: N/A;  ? CORONARY ANGIOPLASTY WITH STENT PLACEMENT  08/09/2016  ? CORONARY ATHERECTOMY N/A 08/09/2016  ? Procedure: Coronary Atherectomy;  Surgeon: Adrian Prows, MD;  Location: [place] [facility];  Service: Cardiovascular;  Laterality: N/A;  ? CORONARY STENT INTERVENTION N/A 08/09/2016  ? Procedure: Coronary Stent Intervention;  Surgeon: Adrian Prows, MD;  Location: [place] [facility];  Service: Cardiovascular;  Laterality: N/A;  LAD  ? EYE SURGERY Bilateral 05/2016  ? "laser on both lenses"  ? FEMUR IM NAIL Left 05/20/2020  ? Procedure: INTRAMEDULLARY (IM) NAIL FEMORAL;  Surgeon: Rod Can, MD;  [facility];  Service: Orthopedics;  Laterality: Left;  ? HEMORRHOID SURGERY N/A 09/27/2017  ? Procedure: HEMORRHOIDECTOMY;  Surgeon: Erroll Luna, MD;  Location: [place];  Service: General;  Laterality: N/A;  ? INGUINAL HERNIA REPAIR Left 1991  ? INSERTION PROSTATE RADIATION SEED  06/25/2009  ? INTRAVASCULAR PRESSURE WIRE/FFR STUDY N/A 08/09/2016  ? Procedure: Intravascular Pressure Wire/FFR Study;  Surgeon: Adrian Prows, MD;  Location: [place] [facility];  Service: Cardiovascular;  Laterality: N/A;  ? [place]  ? MELANOMA EXCISION Left 1990s  ? "shoulder"  ? MELANOMA EXCISION Right 08/2018  ? right shoulder  ? MELANOMA EXCISION  04/20/2021  ?  RIGHT HEART CATH N/A 03/20/2018  ? Procedure: RIGHT HEART CATH;  Surgeon: [name], MD;  Location: [place] [facility];  Service: Cardiovascular;  Laterality: N/A;  ? RIGHT/LEFT HEART CATH AND CORONARY ANGIOGRAPHY N/A 08/09/2016  ? Procedure: Right/Left Heart Cath and Coronary Angiography;  Surgeon: Adrian Prows, MD;  Location: [place] [facility];  Service: Cardiovascular;  Laterality: N/A;  ? SURGERY FOR BROKEN FEMUR Left 05/24/2020  ? TEE WITHOUT CARDIOVERSION N/A 01/25/2017  ? Procedure: TRANSESOPHAGEAL ECHOCARDIOGRAM (TEE);  Surgeon: [name], MD;  Location: [place];  Service:  Cardiovascular;  Laterality: N/A;  ? ?Patient Active Problem List  ? Diagnosis Date Noted  ? Chronic heart failure with preserved ejection fraction ([place]) 06/17/2020  ? Elevated hemidiaphragm   ? Closed fracture of left femur ([place])   ? Pain   ? Hypokalemia 05/19/2020  ? Dilated cardiomyopathy ([place]) 05/19/2020  ? CKD (chronic kidney disease), stage III ([place]) 05/19/2020  ? Parkinson's disease ([place]) 05/19/2020  ? Closed left hip fracture ([place]) 05/18/2020  ? Intertrochanteric fracture ([place]) 05/18/2020  ? Essential hypertension 07/21/2018  ? Bilateral leg edema 07/21/2018  ? Laboratory examination 05/14/2018  ? Pulmonary hypertension ([place]) 03/16/2018  ? Nonrheumatic mitral valve regurgitation 03/16/2018  ? Tricuspid regurgitation 03/16/2018  ? CKD (chronic kidney disease) stage 3, GFR 30-59 ml/min (H[facility]) 09/26/2017  ? Chronic anticoagulation 09/26/2017  ? Atrial fibrillation ([place]) 09/26/2017  ? Atrial flutter ([place]) 02/11/2017  ? Post PTCA 08/09/2016  ? Dyspnea on exertion 08/07/2016  ? Coronary artery disease involving native coronary artery of native heart without angina pectoris 08/07/2016  ? ? ?REFERRING PROVIDER: Rod Can, MD ?  ?REFERRING DIAG: M70.62 (ICD-10-CM) - Trochanteric bursitis, left hip  ?  ?THERAPY DIAG:  ?Muscle weakness (generalized) ?  ?Other lack of coordination ?  ?Pain in left hip ?  ?Difficulty in walking, not elsewhere classified ?  ?ONSET DATE: MD order 04/19/2021 ?  ?SUBJECTIVE:  ?  ?SUBJECTIVE STATEMENT: ?-Pt is wearing compression socks today.  Pt states his leg swelling is not much better and he's taking the pills.  Pt reports he had a stinging pain in his L hip yesterday when he was sitting.  Pt denies any adverse effects after prior Rx.   ? ?FUNCTIONAL IMPROVEMENTS:  increased ambulating in home and ambulating outside some.  Sit/stand transfers.  Pt states he was able to stand some at home without using UE's which he was unable to do before starting PT.  Hip pain.   ?FUNCTIONAL  LIMITATIONS:  ambulation.  He requires walker to ambulate.  Dragging his feet with ambulation.  limited in preparing food.  Hip pain and bilat knee pain with standing.  Pt uses a lift chair at home and has difficulty with sit/stand transfers. ? ?  ?PERTINENT HISTORY: ?-Parkinson's ;  Hx of bilat knee pain and OA ; neuropathy ; left hip intertrochanteric Fx with femur IM nailing 05/20/2020 ; CKD stage 3. ?-Cardiovascular Hx:  permanent atrial fibrillation with cardioversion in 2018 and 2020, chronic heart failure, coronary artery disease s/p Coronary angioplasty in 2018, complex valvular heart disease,  moderate pulmonary hypertension, venous insufficiency, and Dyspnea on exertion ?  ?PAIN:  ?Are you having pain? yes ?NPRS scale: 3/10 pain in hip and bilat knees ?  ?  ?PRECAUTIONS: Other: Parkinson's.  Pt uses a walker.  Chronic heart failure, A-fib  ?  ?WEIGHT BEARING RESTRICTIONS No ?  ?FALLS:  ?Has patient fallen in last 6 months? No ?  ? ?PLOF:  Independent; Pt ambulates with a FWW.   ?  ?PATIENT GOALS improve strength in LE's, walk without walker ?  ?  ?OBJECTIVE:  ?  ?DIAGNOSTIC FINDINGS: N/A ?  ?TODAY'S TREATMENT: ? ?GAIT: ?Assistive device utilized: Environmental consultant - 2 wheeled ?Comments: Pt ambulates with a FWW with bilat knees flexed and fwd flexed/stooped over posture with increased Wb'ing thru UE's. ?  ? ? Standing posture:  Rightward lean with R knee flexed  ?  ?Therapeutic Exercise: ?Reviewed response to prior Rx, pain level, and current function.  ?Pt performed: ?           NuStep at L3 x 5 mins with UE/LE's ?LAQ with 3# x 10 reps on R, 2# 3x10 on L and 2x10 on R reps ?           Seated marching 3x10 with 3# on R and with 1.5# on L  ?           Seated HS curls with RTB 3x10 on R and with YTB 3 x 10 each on L ? Seated hip abd with YTB 2x10 reps ?           Sit to stands 3  x 10 reps from hi/low table with UE's on knees ?          ?   ?  ?Neuro Re-ed activities: ?Pt performed: ?            Low level marching on  airex with UE assistance with CGA 2 x 10 reps  ?            Alt toe taps on 4 inch step with bilat UE assist and CGA 3x10 reps ?     Sidestepping with CGA with UE's ?             ?  ?PATIENT EDUCATION:  ?Education de

## 2021-07-01 ENCOUNTER — Encounter (HOSPITAL_BASED_OUTPATIENT_CLINIC_OR_DEPARTMENT_OTHER): Payer: Medicare Other | Admitting: Physical Therapy

## 2021-07-01 DIAGNOSIS — I10 Essential (primary) hypertension: Secondary | ICD-10-CM | POA: Diagnosis not present

## 2021-07-01 DIAGNOSIS — N1832 Chronic kidney disease, stage 3b: Secondary | ICD-10-CM | POA: Diagnosis not present

## 2021-07-01 DIAGNOSIS — E782 Mixed hyperlipidemia: Secondary | ICD-10-CM | POA: Diagnosis not present

## 2021-07-01 DIAGNOSIS — Z0000 Encounter for general adult medical examination without abnormal findings: Secondary | ICD-10-CM | POA: Diagnosis not present

## 2021-07-01 DIAGNOSIS — I482 Chronic atrial fibrillation, unspecified: Secondary | ICD-10-CM | POA: Diagnosis not present

## 2021-07-01 DIAGNOSIS — K5900 Constipation, unspecified: Secondary | ICD-10-CM | POA: Diagnosis not present

## 2021-07-01 DIAGNOSIS — Z1331 Encounter for screening for depression: Secondary | ICD-10-CM | POA: Diagnosis not present

## 2021-07-06 ENCOUNTER — Ambulatory Visit (HOSPITAL_BASED_OUTPATIENT_CLINIC_OR_DEPARTMENT_OTHER): Payer: Medicare Other | Admitting: Physical Therapy

## 2021-07-06 DIAGNOSIS — M25552 Pain in left hip: Secondary | ICD-10-CM | POA: Diagnosis not present

## 2021-07-06 DIAGNOSIS — M6281 Muscle weakness (generalized): Secondary | ICD-10-CM

## 2021-07-06 DIAGNOSIS — R262 Difficulty in walking, not elsewhere classified: Secondary | ICD-10-CM | POA: Diagnosis not present

## 2021-07-06 DIAGNOSIS — R278 Other lack of coordination: Secondary | ICD-10-CM | POA: Diagnosis not present

## 2021-07-06 DIAGNOSIS — E876 Hypokalemia: Secondary | ICD-10-CM | POA: Diagnosis not present

## 2021-07-06 NOTE — Therapy (Signed)
?OUTPATIENT PHYSICAL THERAPY TREATMENT NOTE ? ? ? ? ? ?Patient Name: [name] ?MRN: [id number] ?DOB:February 09, 1936, 86 y.o., male ?Today's Date: 07/07/2021 ? ?PCP: Carol Ada, MD ? ? PT End of Session - 07/06/21 1159   ? ? Visit Number 12   ? Number of Visits 21   ? Date for PT Re-Evaluation 08/03/21   ? Authorization Type MCR A and B   ? Progress Note Due on Visit 19   ? PT Start Time 1156   ? PT Stop Time 1237   ? PT Time Calculation (min) 41 min   ? Equipment Utilized During Treatment Gait belt   ? Activity Tolerance Patient tolerated treatment well   ? Behavior During Therapy [facility] for tasks assessed/performed   ? ?  ?  ? ?  ? ? ? ? ? ? ? ? ? ?Past Medical History:  ?Diagnosis Date  ? Arthritis   ? "knees; lower back" (08/09/2016)  ? Atrial fibrillation ([place])   ? remote hx/notes 08/07/2016  ? Coronary artery disease   ? Episodes of trembling   ? "most of the time it's my left hand" (08/09/2016)  ? GI bleeding 08/2017  ? High cholesterol   ? History of kidney stones   ? Hypertension   ? Melanoma of shoulder ([place]) 2000s  ? "left"  ? Mitral regurgitation and mitral stenosis   ? hx/notes 08/07/2016  ? Moderate aortic stenosis   ? hx/notes 08/07/2016  ? Parkinson's disease ([place])   ? Prostate cancer [facility]) 2011  ? hx/notes 08/07/2016  ? ?Past Surgical History:  ?Procedure Laterality Date  ? CARDIAC CATHETERIZATION  ~ 2001  ? "[place], [place]"  ? CARDIOVERSION N/A 01/26/2017  ? Procedure: CARDIOVERSION;  Surgeon: [name], MD;  Location: [place];  Service: Cardiovascular;  Laterality: N/A;  ? CARDIOVERSION N/A 03/06/2018  ? Procedure: CARDIOVERSION;  Surgeon: [name], MD;  Location: [place] [facility];  Service: Cardiovascular;  Laterality: N/A;  ? CATARACT EXTRACTION W/ INTRAOCULAR LENS  IMPLANT, BILATERAL Bilateral 2014-02/2016  ? left-right; hx/notes 08/07/2016  ? COLONOSCOPY WITH PROPOFOL N/A 09/25/2017  ? Procedure: COLONOSCOPY WITH PROPOFOL;  Surgeon: Laurence Spates, MD;  Location: [place];  Service: Endoscopy;  Laterality: N/A;  ? CORONARY ANGIOPLASTY WITH STENT PLACEMENT  08/09/2016  ? CORONARY ATHERECTOMY N/A 08/09/2016  ? Procedure: Coronary Atherectomy;  Surgeon: Adrian Prows, MD;  Location: [place] [facility];  Service: Cardiovascular;  Laterality: N/A;  ? CORONARY STENT INTERVENTION N/A 08/09/2016  ? Procedure: Coronary Stent Intervention;  Surgeon: Adrian Prows, MD;  Location: [place] [facility];  Service: Cardiovascular;  Laterality: N/A;  LAD  ? EYE SURGERY Bilateral 05/2016  ? "laser on both lenses"  ? FEMUR IM NAIL Left 05/20/2020  ? Procedure: INTRAMEDULLARY (IM) NAIL FEMORAL;  Surgeon: Rod Can, MD;  [facility];  Service: Orthopedics;  Laterality: Left;  ? HEMORRHOID SURGERY N/A 09/27/2017  ? Procedure: HEMORRHOIDECTOMY;  Surgeon: Erroll Luna, MD;  Location: [place];  Service: General;  Laterality: N/A;  ? INGUINAL HERNIA REPAIR Left 1991  ? INSERTION PROSTATE RADIATION SEED  06/25/2009  ? INTRAVASCULAR PRESSURE WIRE/FFR STUDY N/A 08/09/2016  ? Procedure: Intravascular Pressure Wire/FFR Study;  Surgeon: Adrian Prows, MD;  Location: [place] [facility];  Service: Cardiovascular;  Laterality: N/A;  ? [place]  ? MELANOMA EXCISION Left 1990s  ? "shoulder"  ? MELANOMA EXCISION Right 08/2018  ? right shoulder  ? MELANOMA EXCISION  04/20/2021  ? RIGHT HEART  CATH N/A 03/20/2018  ? Procedure: RIGHT HEART CATH;  Surgeon: [name], MD;  Location: [place] [facility];  Service: Cardiovascular;  Laterality: N/A;  ? RIGHT/LEFT HEART CATH AND CORONARY ANGIOGRAPHY N/A 08/09/2016  ? Procedure: Right/Left Heart Cath and Coronary Angiography;  Surgeon: Adrian Prows, MD;  Location: [place] [facility];  Service: Cardiovascular;  Laterality: N/A;  ? SURGERY FOR BROKEN FEMUR Left 05/24/2020  ? TEE WITHOUT CARDIOVERSION N/A 01/25/2017  ? Procedure: TRANSESOPHAGEAL ECHOCARDIOGRAM (TEE);  Surgeon: [name], MD;  Location: [place];  Service:  Cardiovascular;  Laterality: N/A;  ? ?Patient Active Problem List  ? Diagnosis Date Noted  ? Chronic heart failure with preserved ejection fraction ([place]) 06/17/2020  ? Elevated hemidiaphragm   ? Closed fracture of left femur ([place])   ? Pain   ? Hypokalemia 05/19/2020  ? Dilated cardiomyopathy ([place]) 05/19/2020  ? CKD (chronic kidney disease), stage III ([place]) 05/19/2020  ? Parkinson's disease ([place]) 05/19/2020  ? Closed left hip fracture ([place]) 05/18/2020  ? Intertrochanteric fracture ([place]) 05/18/2020  ? Essential hypertension 07/21/2018  ? Bilateral leg edema 07/21/2018  ? Laboratory examination 05/14/2018  ? Pulmonary hypertension ([place]) 03/16/2018  ? Nonrheumatic mitral valve regurgitation 03/16/2018  ? Tricuspid regurgitation 03/16/2018  ? CKD (chronic kidney disease) stage 3, GFR 30-59 ml/min ([facility]) 09/26/2017  ? Chronic anticoagulation 09/26/2017  ? Atrial fibrillation ([place]) 09/26/2017  ? Atrial flutter ([place]) 02/11/2017  ? Post PTCA 08/09/2016  ? Dyspnea on exertion 08/07/2016  ? Coronary artery disease involving native coronary artery of native heart without angina pectoris 08/07/2016  ? ? ?REFERRING PROVIDER: Rod Can, MD ?  ?REFERRING DIAG: M70.62 (ICD-10-CM) - Trochanteric bursitis, left hip  ?  ?THERAPY DIAG:  ?Muscle weakness (generalized) ?  ?Other lack of coordination ?  ?Pain in left hip ?  ?Difficulty in walking, not elsewhere classified ?  ?ONSET DATE: MD order 04/19/2021 ?  ?SUBJECTIVE:  ?  ?SUBJECTIVE STATEMENT: ?-Pt is not wearing compression socks today.  Pt continues to have bilat leg swelling R > L.  Pt states it's his swelling is down a little bit.  Pt reports he had an ingrown toenail removed from R foot last week.  Pt denies any adverse effects after prior Rx.   ? ?FUNCTIONAL IMPROVEMENTS:  increased ambulating in home and ambulating outside some.  Sit/stand transfers.  Hip pain.   ?FUNCTIONAL LIMITATIONS:  ambulation.  He requires walker to ambulate.  Dragging his feet with ambulation.   limited in preparing food.  Hip pain and bilat knee pain with standing.  Pt uses a lift chair at home and has difficulty with sit/stand transfers. ? ?  ?PERTINENT HISTORY: ?-Parkinson's ;  Hx of bilat knee pain and OA ; neuropathy ; left hip intertrochanteric Fx with femur IM nailing 05/20/2020 ; CKD stage 3. ?-Cardiovascular Hx:  permanent atrial fibrillation with cardioversion in 2018 and 2020, chronic heart failure, coronary artery disease s/p Coronary angioplasty in 2018, complex valvular heart disease,  moderate pulmonary hypertension, venous insufficiency, and Dyspnea on exertion ?  ?PAIN:  ?Are you having pain? no ?NPRS scale: 0/10 pain in hip and bilat knees ?  ?  ?PRECAUTIONS: Other: Parkinson's.  Pt uses a walker.  Chronic heart failure, A-fib  ?  ?WEIGHT BEARING RESTRICTIONS No ?  ?FALLS:  ?Has patient fallen in last 6 months? No ?  ? ?PLOF: Independent; Pt ambulates with a FWW.   ?  ?PATIENT GOALS improve strength in LE's, walk without walker ?  ?  ?  OBJECTIVE:  ?  ?DIAGNOSTIC FINDINGS: N/A ?  ?TODAY'S TREATMENT: ? ?GAIT: ?Assistive device utilized: Environmental consultant - 2 wheeled ?Comments: Pt ambulates with a FWW with bilat knees flexed and fwd flexed/stooped over posture with increased Wb'ing thru UE's. ?  ? ? Standing posture:  Rightward lean with R knee flexed  ?  ?Therapeutic Exercise: ?Reviewed response to prior Rx, pain level, and current function.  ?Pt performed: ?           NuStep at L3 x 5 mins with UE/LE's ?LAQ with 3# x 10 reps on R, 2# 3x10 on L and 2x10 on R reps ?           Seated marching 3x10 with 3# on R and with 1.5# on L  ?           Seated HS curls with RTB 3x10 on R and with YTB 3 x 10 each on L ? Seated hip abd with YTB 2x10 reps ?           Sit to stands 3  x 10 reps from hi/low table with UE's on knees ?          ?   ?  ?Neuro Re-ed activities: ?Pt performed: ?            Low level marching on airex with UE assistance with CGA 2 x 10 reps  ?        Standing on airex without UE assist with CGA  and verbal cuing x18 sec and x 14 sec ?   Sidestepping with CGA with Ue's at rail ?             ?  ?PATIENT EDUCATION:  ?Education details:  PT spent time answering pt's girlfriend's questions.  Objective findin

## 2021-07-07 ENCOUNTER — Encounter (HOSPITAL_BASED_OUTPATIENT_CLINIC_OR_DEPARTMENT_OTHER): Payer: Self-pay | Admitting: Physical Therapy

## 2021-07-08 ENCOUNTER — Encounter (HOSPITAL_BASED_OUTPATIENT_CLINIC_OR_DEPARTMENT_OTHER): Payer: Self-pay | Admitting: Physical Therapy

## 2021-07-08 ENCOUNTER — Ambulatory Visit (HOSPITAL_BASED_OUTPATIENT_CLINIC_OR_DEPARTMENT_OTHER): Payer: Medicare Other | Admitting: Physical Therapy

## 2021-07-08 DIAGNOSIS — R262 Difficulty in walking, not elsewhere classified: Secondary | ICD-10-CM | POA: Diagnosis not present

## 2021-07-08 DIAGNOSIS — M6281 Muscle weakness (generalized): Secondary | ICD-10-CM

## 2021-07-08 DIAGNOSIS — R278 Other lack of coordination: Secondary | ICD-10-CM

## 2021-07-08 DIAGNOSIS — M25552 Pain in left hip: Secondary | ICD-10-CM | POA: Diagnosis not present

## 2021-07-08 NOTE — Therapy (Signed)
?OUTPATIENT PHYSICAL THERAPY TREATMENT NOTE ? ? ? ? ? ?Patient Name: [name] ?MRN: [id number] ?DOB:13-Jun-1935, 86 y.o., male ?Today's Date: 07/08/2021 ? ?PCP: Carol Ada, MD ? ? PT End of Session - 07/08/21 1129   ? ? Visit Number 13   ? Number of Visits 21   ? Date for PT Re-Evaluation 08/03/21   ? Authorization Type MCR A and B   ? PT Start Time 1111   ? PT Stop Time 1153   ? PT Time Calculation (min) 42 min   ? Equipment Utilized During Treatment Gait belt   ? Activity Tolerance Patient tolerated treatment well   ? Behavior During Therapy [facility] for tasks assessed/performed   ? ?  ?  ? ?  ? ? ? ? ? ? ? ? ? ?Past Medical History:  ?Diagnosis Date  ? Arthritis   ? "knees; lower back" (08/09/2016)  ? Atrial fibrillation ([place])   ? remote hx/notes 08/07/2016  ? Coronary artery disease   ? Episodes of trembling   ? "most of the time it's my left hand" (08/09/2016)  ? GI bleeding 08/2017  ? High cholesterol   ? History of kidney stones   ? Hypertension   ? Melanoma of shoulder ([place]) 2000s  ? "left"  ? Mitral regurgitation and mitral stenosis   ? hx/notes 08/07/2016  ? Moderate aortic stenosis   ? hx/notes 08/07/2016  ? Parkinson's disease ([place])   ? Prostate cancer [facility]) 2011  ? hx/notes 08/07/2016  ? ?Past Surgical History:  ?Procedure Laterality Date  ? CARDIAC CATHETERIZATION  ~ 2001  ? "[place], [place]"  ? CARDIOVERSION N/A 01/26/2017  ? Procedure: CARDIOVERSION;  Surgeon: [name], MD;  Location: [place];  Service: Cardiovascular;  Laterality: N/A;  ? CARDIOVERSION N/A 03/06/2018  ? Procedure: CARDIOVERSION;  Surgeon: [name], MD;  Location: [place] [facility];  Service: Cardiovascular;  Laterality: N/A;  ? CATARACT EXTRACTION W/ INTRAOCULAR LENS  IMPLANT, BILATERAL Bilateral 2014-02/2016  ? left-right; hx/notes 08/07/2016  ? COLONOSCOPY WITH PROPOFOL N/A 09/25/2017  ? Procedure: COLONOSCOPY WITH PROPOFOL;  Surgeon: Laurence Spates, MD;  Location: [place];  Service: Endoscopy;   Laterality: N/A;  ? CORONARY ANGIOPLASTY WITH STENT PLACEMENT  08/09/2016  ? CORONARY ATHERECTOMY N/A 08/09/2016  ? Procedure: Coronary Atherectomy;  Surgeon: Adrian Prows, MD;  Location: [place] [facility];  Service: Cardiovascular;  Laterality: N/A;  ? CORONARY STENT INTERVENTION N/A 08/09/2016  ? Procedure: Coronary Stent Intervention;  Surgeon: Adrian Prows, MD;  Location: [place] [facility];  Service: Cardiovascular;  Laterality: N/A;  LAD  ? EYE SURGERY Bilateral 05/2016  ? "laser on both lenses"  ? FEMUR IM NAIL Left 05/20/2020  ? Procedure: INTRAMEDULLARY (IM) NAIL FEMORAL;  Surgeon: Rod Can, MD;  [facility];  Service: Orthopedics;  Laterality: Left;  ? HEMORRHOID SURGERY N/A 09/27/2017  ? Procedure: HEMORRHOIDECTOMY;  Surgeon: Erroll Luna, MD;  Location: [place];  Service: General;  Laterality: N/A;  ? INGUINAL HERNIA REPAIR Left 1991  ? INSERTION PROSTATE RADIATION SEED  06/25/2009  ? INTRAVASCULAR PRESSURE WIRE/FFR STUDY N/A 08/09/2016  ? Procedure: Intravascular Pressure Wire/FFR Study;  Surgeon: Adrian Prows, MD;  Location: [place] [facility];  Service: Cardiovascular;  Laterality: N/A;  ? [place]  ? MELANOMA EXCISION Left 1990s  ? "shoulder"  ? MELANOMA EXCISION Right 08/2018  ? right shoulder  ? MELANOMA EXCISION  04/20/2021  ? RIGHT HEART CATH N/A 03/20/2018  ? Procedure: RIGHT HEART CATH;  Surgeon: [name], MD;  Location: [place] [facility];  Service: Cardiovascular;  Laterality: N/A;  ? RIGHT/LEFT HEART CATH AND CORONARY ANGIOGRAPHY N/A 08/09/2016  ? Procedure: Right/Left Heart Cath and Coronary Angiography;  Surgeon: Adrian Prows, MD;  Location: [place] [facility];  Service: Cardiovascular;  Laterality: N/A;  ? SURGERY FOR BROKEN FEMUR Left 05/24/2020  ? TEE WITHOUT CARDIOVERSION N/A 01/25/2017  ? Procedure: TRANSESOPHAGEAL ECHOCARDIOGRAM (TEE);  Surgeon: [name], MD;  Location: [place];  Service: Cardiovascular;  Laterality: N/A;   ? ?Patient Active Problem List  ? Diagnosis Date Noted  ? Chronic heart failure with preserved ejection fraction ([place]) 06/17/2020  ? Elevated hemidiaphragm   ? Closed fracture of left femur ([place])   ? Pain   ? Hypokalemia 05/19/2020  ? Dilated cardiomyopathy ([place]) 05/19/2020  ? CKD (chronic kidney disease), stage III ([place]) 05/19/2020  ? Parkinson's disease ([place]) 05/19/2020  ? Closed left hip fracture ([place]) 05/18/2020  ? Intertrochanteric fracture ([place]) 05/18/2020  ? Essential hypertension 07/21/2018  ? Bilateral leg edema 07/21/2018  ? Laboratory examination 05/14/2018  ? Pulmonary hypertension ([place]) 03/16/2018  ? Nonrheumatic mitral valve regurgitation 03/16/2018  ? Tricuspid regurgitation 03/16/2018  ? CKD (chronic kidney disease) stage 3, GFR 30-59 ml/min (H[facility]) 09/26/2017  ? Chronic anticoagulation 09/26/2017  ? Atrial fibrillation ([place]) 09/26/2017  ? Atrial flutter ([place]) 02/11/2017  ? Post PTCA 08/09/2016  ? Dyspnea on exertion 08/07/2016  ? Coronary artery disease involving native coronary artery of native heart without angina pectoris 08/07/2016  ? ? ?REFERRING PROVIDER: Rod Can, MD ?  ?REFERRING DIAG: M70.62 (ICD-10-CM) - Trochanteric bursitis, left hip  ?  ?THERAPY DIAG:  ?Muscle weakness (generalized) ?  ?Other lack of coordination ?  ?Pain in left hip ?  ?Difficulty in walking, not elsewhere classified ?  ?ONSET DATE: MD order 04/19/2021 ?  ?SUBJECTIVE:  ?  ?SUBJECTIVE STATEMENT: ?-Pt is not wearing compression socks today.  Pt states his swelling is improving.  Pt reports improved standing.  "You sure have helped me." Pt denies any adverse effects after prior Rx.  Pt states he worked out yesterday for 1.5 hours.   ? ?FUNCTIONAL IMPROVEMENTS:  increased ambulating in home and ambulating outside some.  Sit/stand transfers.  Hip pain.   ?FUNCTIONAL LIMITATIONS:  ambulation.  He requires walker to ambulate.  Dragging his feet with ambulation.  limited in preparing food.  Hip pain and bilat knee pain  with standing.  Pt uses a lift chair at home and has difficulty with sit/stand transfers. ? ?  ?PERTINENT HISTORY: ?-Parkinson's ;  Hx of bilat knee pain and OA ; neuropathy ; left hip intertrochanteric Fx with femur IM nailing 05/20/2020 ; CKD stage 3. ?-Cardiovascular Hx:  permanent atrial fibrillation with cardioversion in 2018 and 2020, chronic heart failure, coronary artery disease s/p Coronary angioplasty in 2018, complex valvular heart disease,  moderate pulmonary hypertension, venous insufficiency, and Dyspnea on exertion ?  ?PAIN:  ?Are you having pain? no ?NPRS scale: 0/10 pain in hip and bilat knees ?  ?  ?PRECAUTIONS: Other: Parkinson's.  Pt uses a walker.  Chronic heart failure, A-fib  ?  ?WEIGHT BEARING RESTRICTIONS No ?  ?FALLS:  ?Has patient fallen in last 6 months? No ?  ? ?PLOF: Independent; Pt ambulates with a FWW.   ?  ?PATIENT GOALS improve strength in LE's, walk without walker ?  ?  ?OBJECTIVE:  ?  ?DIAGNOSTIC FINDINGS: N/A ?  ?TODAY'S TREATMENT: ? ?GAIT: ?Assistive device utilized:  Walker - 2 wheeled ?Comments: Pt ambulates with a FWW with bilat knees flexed and fwd flexed/stooped over posture with increased Wb'ing thru UE's. ?  ? ? Standing posture:  Rightward lean with R knee flexed  ?  ?Therapeutic Exercise: ?Reviewed response to prior Rx, pain level, and current function.  ?Pt performed: ?           NuStep at L3 x 6 mins with UE/LE's ?LAQ with 3# 3 x 10 reps on R, 2# 3x10 on L  ?           Seated marching 3x10 with 3# on R and with 1.5# on L  ?           Seated HS curls with RTB 2x10 on R and with YTB 2 x 10 on L ?           ?   ?Therapeutic Activities:  for improved functional mobility  ?Pt performed: ? Sidestepping with CGA with Ue's at rail ?  Sit to stands 3  x 10 reps from hi/low table with UE's on knees ? Step ups on 4 inch step with CGA and bilat UE assist on rail x10 reps on L and aprox 6 reps on R ?  ?  ?Neuro Re-ed activities: ?Pt performed: ?            Low level marching on  airex with UE assistance with CGA 2 x 10-15 reps and x 15 reps on the floor ?        Standing on airex without UE assist with CGA and verbal cuing 3x30 sec ?   Alt Toe tapping on 6 inch step with CGA and bilat Ue's

## 2021-07-13 ENCOUNTER — Ambulatory Visit (HOSPITAL_BASED_OUTPATIENT_CLINIC_OR_DEPARTMENT_OTHER): Payer: Medicare Other | Admitting: Physical Therapy

## 2021-07-13 DIAGNOSIS — R278 Other lack of coordination: Secondary | ICD-10-CM | POA: Diagnosis not present

## 2021-07-13 DIAGNOSIS — M6281 Muscle weakness (generalized): Secondary | ICD-10-CM | POA: Diagnosis not present

## 2021-07-13 DIAGNOSIS — M25552 Pain in left hip: Secondary | ICD-10-CM

## 2021-07-13 DIAGNOSIS — R262 Difficulty in walking, not elsewhere classified: Secondary | ICD-10-CM | POA: Diagnosis not present

## 2021-07-13 NOTE — Therapy (Signed)
?OUTPATIENT PHYSICAL THERAPY TREATMENT NOTE ? ? ? ? ? ?Patient Name: [name] ?MRN: [id number] ?DOB:Nov 14, 1935, 86 y.o., male ?Today's Date: 07/14/2021 ? ?PCP: Carol Ada, MD ? ? PT End of Session - 07/14/21 1422   ? ? Visit Number 14   ? Number of Visits 21   ? Date for PT Re-Evaluation 08/03/21   ? Authorization Type MCR A and B   ? Progress Note Due on Visit 19   ? PT Start Time 1150   ? PT Stop Time 1231   ? PT Time Calculation (min) 41 min   ? Equipment Utilized During Treatment Gait belt   ? Activity Tolerance Patient tolerated treatment well   ? Behavior During Therapy [facility] for tasks assessed/performed   ? ?  ?  ? ?  ? ? ? ? ? ? ? ? ? ? ?Past Medical History:  ?Diagnosis Date  ? Arthritis   ? "knees; lower back" (08/09/2016)  ? Atrial fibrillation ([place])   ? remote hx/notes 08/07/2016  ? Coronary artery disease   ? Episodes of trembling   ? "most of the time it's my left hand" (08/09/2016)  ? GI bleeding 08/2017  ? High cholesterol   ? History of kidney stones   ? Hypertension   ? Melanoma of shoulder ([place]) 2000s  ? "left"  ? Mitral regurgitation and mitral stenosis   ? hx/notes 08/07/2016  ? Moderate aortic stenosis   ? hx/notes 08/07/2016  ? Parkinson's disease ([place])   ? Prostate cancer [facility]) 2011  ? hx/notes 08/07/2016  ? ?Past Surgical History:  ?Procedure Laterality Date  ? CARDIAC CATHETERIZATION  ~ 2001  ? "[place], [place]"  ? CARDIOVERSION N/A 01/26/2017  ? Procedure: CARDIOVERSION;  Surgeon: [name], MD;  Location: [place];  Service: Cardiovascular;  Laterality: N/A;  ? CARDIOVERSION N/A 03/06/2018  ? Procedure: CARDIOVERSION;  Surgeon: [name], MD;  Location: [facility];  Service: Cardiovascular;  Laterality: N/A;  ? CATARACT EXTRACTION W/ INTRAOCULAR LENS  IMPLANT, BILATERAL Bilateral 2014-02/2016  ? left-right; hx/notes 08/07/2016  ? COLONOSCOPY WITH PROPOFOL N/A 09/25/2017  ? Procedure: COLONOSCOPY WITH PROPOFOL;  Surgeon: Laurence Spates, MD;  Location: [place];  Service: Endoscopy;  Laterality: N/A;  ? CORONARY ANGIOPLASTY WITH STENT PLACEMENT  08/09/2016  ? CORONARY ATHERECTOMY N/A 08/09/2016  ? Procedure: Coronary Atherectomy;  Surgeon: Adrian Prows, MD;  Location: [place] [facility];  Service: Cardiovascular;  Laterality: N/A;  ? CORONARY STENT INTERVENTION N/A 08/09/2016  ? Procedure: Coronary Stent Intervention;  Surgeon: Adrian Prows, MD;  Location: [place] [facility];  Service: Cardiovascular;  Laterality: N/A;  LAD  ? EYE SURGERY Bilateral 05/2016  ? "laser on both lenses"  ? FEMUR IM NAIL Left 05/20/2020  ? Procedure: INTRAMEDULLARY (IM) NAIL FEMORAL;  Surgeon: Rod Can, MD;  [facility];  Service: Orthopedics;  Laterality: Left;  ? HEMORRHOID SURGERY N/A 09/27/2017  ? Procedure: HEMORRHOIDECTOMY;  Surgeon: Erroll Luna, MD;  Location: [place];  Service: General;  Laterality: N/A;  ? INGUINAL HERNIA REPAIR Left 1991  ? INSERTION PROSTATE RADIATION SEED  06/25/2009  ? INTRAVASCULAR PRESSURE WIRE/FFR STUDY N/A 08/09/2016  ? Procedure: Intravascular Pressure Wire/FFR Study;  Surgeon: Adrian Prows, MD;  Location: [place] [facility];  Service: Cardiovascular;  Laterality: N/A;  ? [place]  ? MELANOMA EXCISION Left 1990s  ? "shoulder"  ? MELANOMA EXCISION Right 08/2018  ? right shoulder  ? MELANOMA EXCISION  04/20/2021  ? RIGHT  HEART CATH N/A 03/20/2018  ? Procedure: RIGHT HEART CATH;  Surgeon: [name], MD;  Location: [place] [facility];  Service: Cardiovascular;  Laterality: N/A;  ? RIGHT/LEFT HEART CATH AND CORONARY ANGIOGRAPHY N/A 08/09/2016  ? Procedure: Right/Left Heart Cath and Coronary Angiography;  Surgeon: Adrian Prows, MD;  Location: [place] [facility];  Service: Cardiovascular;  Laterality: N/A;  ? SURGERY FOR BROKEN FEMUR Left 05/24/2020  ? TEE WITHOUT CARDIOVERSION N/A 01/25/2017  ? Procedure: TRANSESOPHAGEAL ECHOCARDIOGRAM (TEE);  Surgeon: [name], MD;  Location: [place];  Service:  Cardiovascular;  Laterality: N/A;  ? ?Patient Active Problem List  ? Diagnosis Date Noted  ? Chronic heart failure with preserved ejection fraction ([place]) 06/17/2020  ? Elevated hemidiaphragm   ? Closed fracture of left femur ([place])   ? Pain   ? Hypokalemia 05/19/2020  ? Dilated cardiomyopathy ([place]) 05/19/2020  ? CKD (chronic kidney disease), stage III ([place]) 05/19/2020  ? Parkinson's disease ([place]) 05/19/2020  ? Closed left hip fracture ([place]) 05/18/2020  ? Intertrochanteric fracture ([place]) 05/18/2020  ? Essential hypertension 07/21/2018  ? Bilateral leg edema 07/21/2018  ? Laboratory examination 05/14/2018  ? Pulmonary hypertension ([place]) 03/16/2018  ? Nonrheumatic mitral valve regurgitation 03/16/2018  ? Tricuspid regurgitation 03/16/2018  ? CKD (chronic kidney disease) stage 3, GFR 30-59 ml/min ([facility]) 09/26/2017  ? Chronic anticoagulation 09/26/2017  ? Atrial fibrillation ([place]) 09/26/2017  ? Atrial flutter ([place]) 02/11/2017  ? Post PTCA 08/09/2016  ? Dyspnea on exertion 08/07/2016  ? Coronary artery disease involving native coronary artery of native heart without angina pectoris 08/07/2016  ? ? ?REFERRING PROVIDER: Rod Can, MD ?  ?REFERRING DIAG: M70.62 (ICD-10-CM) - Trochanteric bursitis, left hip  ?  ?THERAPY DIAG:  ?Muscle weakness (generalized) ?  ?Other lack of coordination ?  ?Pain in left hip ?  ?Difficulty in walking, not elsewhere classified ?  ?ONSET DATE: MD order 04/19/2021 ?  ?SUBJECTIVE:  ?  ?SUBJECTIVE STATEMENT: ?-Pt is not wearing compression socks today.  Pt continues to have swelling.  Pt reports improved standing.  Pt denies any adverse effects after prior Rx.  Pt states he performs his exercises everyday.   ? ?FUNCTIONAL IMPROVEMENTS:  pt states he can stand a little longer.   increased ambulating in home and ambulating outside some.  Sit/stand transfers.  Hip pain.   ?FUNCTIONAL LIMITATIONS:  ambulation.  He requires walker to ambulate.  Dragging his feet with ambulation.  limited in  preparing food.  Hip pain and bilat knee pain with standing.  Pt uses a lift chair at home and has difficulty with sit/stand transfers. ? ?  ?PERTINENT HISTORY: ?-Parkinson's ;  Hx of bilat knee pain and OA ; neuropathy ; left hip intertrochanteric Fx with femur IM nailing 05/20/2020 ; CKD stage 3. ?-Cardiovascular Hx:  permanent atrial fibrillation with cardioversion in 2018 and 2020, chronic heart failure, coronary artery disease s/p Coronary angioplasty in 2018, complex valvular heart disease,  moderate pulmonary hypertension, venous insufficiency, and Dyspnea on exertion ?  ?PAIN:  ?Are you having pain? no ?NPRS scale: 0/10 pain in hip and bilat knees ?  ?  ?PRECAUTIONS: Other: Parkinson's.  Pt uses a walker.  Chronic heart failure, A-fib  ?  ?WEIGHT BEARING RESTRICTIONS No ?  ?FALLS:  ?Has patient fallen in last 6 months? No ?  ? ?PLOF: Independent; Pt ambulates with a FWW.   ?  ?PATIENT GOALS improve strength in LE's, walk without walker ?  ?  ?OBJECTIVE:  ?  ?  DIAGNOSTIC FINDINGS: N/A ?  ?TODAY'S TREATMENT: ? ?GAIT: ?Assistive device utilized: Environmental consultant - 2 wheeled ?Comments: Pt ambulates with a FWW with bilat knees flexed and fwd flexed/stooped over posture with increased Wb'ing thru UE's. ?  ? ? Standing posture:  Rightward lean with R knee flexed  ?  ?Therapeutic Exercise: ?Reviewed response to prior Rx, pain level, and current function.  ?Pt performed: ?           NuStep at L3 x 6 mins with UE/LE's ? LAQ with 3# 3 x 10 reps on R, 2# 4x10 on L  ?           Seated marching x30-40 reps with 3# on R and with 1.5# on L  ?           Seated HS curls with GTB 3x10 on R and with RTB 3 x 10 on L ? Seated hip abd with RTB on R and YTB on L 2x10 ?           Low level marching on airex with UE assistance with CGA 2 x 10-15 reps  ?           ?   ?Therapeutic Activities:  for improved functional mobility  ?Pt performed: ? Sidestepping with CGA with Ue's at rail ?  Sit to stands 3  x 10 reps from hi/low table with UE's on  knees ? Step ups on 4 inch step with CGA and bilat UE assist on rail x10 reps bilat ?  ?  ? ?  ?PATIENT EDUCATION:  ?Education details:  PT spent time answering pt's girlfriend's questions.  Progress, exercise form, a

## 2021-07-14 ENCOUNTER — Encounter (HOSPITAL_BASED_OUTPATIENT_CLINIC_OR_DEPARTMENT_OTHER): Payer: Self-pay | Admitting: Physical Therapy

## 2021-07-15 ENCOUNTER — Ambulatory Visit (HOSPITAL_BASED_OUTPATIENT_CLINIC_OR_DEPARTMENT_OTHER): Payer: Medicare Other | Admitting: Physical Therapy

## 2021-07-15 ENCOUNTER — Encounter (HOSPITAL_BASED_OUTPATIENT_CLINIC_OR_DEPARTMENT_OTHER): Payer: Self-pay | Admitting: Physical Therapy

## 2021-07-15 DIAGNOSIS — M25552 Pain in left hip: Secondary | ICD-10-CM

## 2021-07-15 DIAGNOSIS — R262 Difficulty in walking, not elsewhere classified: Secondary | ICD-10-CM | POA: Diagnosis not present

## 2021-07-15 DIAGNOSIS — R278 Other lack of coordination: Secondary | ICD-10-CM | POA: Diagnosis not present

## 2021-07-15 DIAGNOSIS — M6281 Muscle weakness (generalized): Secondary | ICD-10-CM

## 2021-07-15 NOTE — Therapy (Signed)
OUTPATIENT PHYSICAL THERAPY TREATMENT NOTE      Patient Name: [name] MRN: [id number] DOB:01-May-1935, 86 y.o., male Today's Date: 07/16/2021  PCP: Carol Ada, MD   PT End of Session - 07/15/21 1309     Visit Number 15    Number of Visits 21    Date for PT Re-Evaluation 08/03/21    Authorization Type MCR A and B    PT Start Time 1157    PT Stop Time 7078    PT Time Calculation (min) 43 min    Equipment Utilized During Treatment Gait belt    Activity Tolerance Patient tolerated treatment well    Behavior During Therapy WFL for tasks assessed/performed                      Past Medical History:  Diagnosis Date   Arthritis    "knees; lower back" (08/09/2016)   Atrial fibrillation ([place])    remote hx/notes 08/07/2016   Coronary artery disease    Episodes of trembling    "most of the time it's my left hand" (08/09/2016)   GI bleeding 08/2017   High cholesterol    History of kidney stones    Hypertension    Melanoma of shoulder ([place]) 2000s   "left"   Mitral regurgitation and mitral stenosis    hx/notes 08/07/2016   Moderate aortic stenosis    hx/notes 08/07/2016   Parkinson's disease ([place])    Prostate cancer ([place]) 2011   hx/notes 08/07/2016   Past Surgical History:  Procedure Laterality Date   CARDIAC CATHETERIZATION  ~ [address], [place]"   CARDIOVERSION N/A 01/26/2017   Procedure: CARDIOVERSION;  Surgeon: [name], MD;  Location: [facility];  Service: Cardiovascular;  Laterality: N/A;   CARDIOVERSION N/A 03/06/2018   Procedure: CARDIOVERSION;  Surgeon: [name], MD;  Location: [facility];  Service: Cardiovascular;  Laterality: N/A;   CATARACT EXTRACTION W/ INTRAOCULAR LENS  IMPLANT, BILATERAL Bilateral 2014-02/2016   left-right; hx/notes 08/07/2016   COLONOSCOPY WITH PROPOFOL N/A 09/25/2017   Procedure: COLONOSCOPY WITH PROPOFOL;  Surgeon: Laurence Spates, MD;  Location: [facility];  Service: Endoscopy;   Laterality: N/A;   CORONARY ANGIOPLASTY WITH STENT PLACEMENT  08/09/2016   CORONARY ATHERECTOMY N/A 08/09/2016   Procedure: Coronary Atherectomy;  Surgeon: Adrian Prows, MD;  Location: [facility];  Service: Cardiovascular;  Laterality: N/A;   CORONARY STENT INTERVENTION N/A 08/09/2016   Procedure: Coronary Stent Intervention;  Surgeon: Adrian Prows, MD;  Location: [facility];  Service: Cardiovascular;  Laterality: N/A;  LAD   EYE SURGERY Bilateral 05/2016   "laser on both lenses"   FEMUR IM NAIL Left 05/20/2020   Procedure: INTRAMEDULLARY (IM) NAIL FEMORAL;  Surgeon: Rod Can, MD;  Location: [facility];  Service: Orthopedics;  Laterality: Left;   HEMORRHOID SURGERY N/A 09/27/2017   Procedure: HEMORRHOIDECTOMY;  Surgeon: Erroll Luna, MD;  Location: [facility];  Service: General;  Laterality: N/A;   INGUINAL HERNIA REPAIR Left [place]  06/25/2009   INTRAVASCULAR PRESSURE WIRE/FFR STUDY N/A 08/09/2016   Procedure: Intravascular Pressure Wire/FFR Study;  Surgeon: Adrian Prows, MD;  Location: [facility];  Service: Cardiovascular;  Laterality: N/A;   [place] Left 1990s   "shoulder"   MELANOMA EXCISION Right 08/2018   right shoulder   MELANOMA EXCISION  04/20/2021   RIGHT HEART CATH N/A 03/20/2018   Procedure: RIGHT  HEART CATH;  Surgeon: [name], MD;  Location: [facility];  Service: Cardiovascular;  Laterality: N/A;   RIGHT/LEFT HEART CATH AND CORONARY ANGIOGRAPHY N/A 08/09/2016   Procedure: Right/Left Heart Cath and Coronary Angiography;  Surgeon: Adrian Prows, MD;  Location: [facility];  Service: Cardiovascular;  Laterality: N/A;   SURGERY FOR BROKEN FEMUR Left 05/24/2020   TEE WITHOUT CARDIOVERSION N/A 01/25/2017   Procedure: TRANSESOPHAGEAL ECHOCARDIOGRAM (TEE);  Surgeon: [name], MD;  Location: [facility];  Service: Cardiovascular;  Laterality: N/A;    Patient Active Problem List   Diagnosis Date Noted   Chronic heart failure with preserved ejection fraction ([place]) 06/17/2020   Elevated hemidiaphragm    Closed fracture of left femur ([facility])    Pain    Hypokalemia 05/19/2020   Dilated cardiomyopathy ([place]) 05/19/2020   CKD (chronic kidney disease), stage III ([place]) 05/19/2020   Parkinson's disease ([place]) 05/19/2020   Closed left hip fracture ([place]) 05/18/2020   Intertrochanteric fracture ([place]) 05/18/2020   Essential hypertension 07/21/2018   Bilateral leg edema 07/21/2018   Laboratory examination 05/14/2018   Pulmonary hypertension ([place]) 03/16/2018   Nonrheumatic mitral valve regurgitation 03/16/2018   Tricuspid regurgitation 03/16/2018   CKD (chronic kidney disease) stage 3, GFR 30-59 ml/min (HCC) 09/26/2017   Chronic anticoagulation 09/26/2017   Atrial fibrillation ([place]) 09/26/2017   Atrial flutter ([place]) 02/11/2017   Post PTCA 08/09/2016   Dyspnea on exertion 08/07/2016   Coronary artery disease involving native coronary artery of native heart without angina pectoris 08/07/2016    REFERRING PROVIDER: Rod Can, MD   REFERRING DIAG: M70.62 (ICD-10-CM) - Trochanteric bursitis, left hip    THERAPY DIAG:  Muscle weakness (generalized)   Other lack of coordination   Pain in left hip   Difficulty in walking, not elsewhere classified   ONSET DATE: MD order 04/19/2021   SUBJECTIVE:    SUBJECTIVE STATEMENT: -Pt is wearing compression socks today.  He states he is should be wearing them daily.  Pt continues to have swelling and states his swelling is a little better.  Pt reports improved standing.  Pt denies any adverse effects after prior Rx.  Pt states he did his exercises for 1.5 hrs yesterday.    FUNCTIONAL IMPROVEMENTS:  pt states he can stand a little longer.   increased ambulating in home and ambulating outside some.  Sit/stand transfers.  Hip pain.   FUNCTIONAL LIMITATIONS:  ambulation.  He requires walker to  ambulate.  Dragging his feet with ambulation.  limited in preparing food.  Hip pain and bilat knee pain with standing.  Pt uses a lift chair at home and has difficulty with sit/stand transfers.    PERTINENT HISTORY: -Parkinson's ;  Hx of bilat knee pain and OA ; neuropathy ; left hip intertrochanteric Fx with femur IM nailing 05/20/2020 ; CKD stage 3. -Cardiovascular Hx:  permanent atrial fibrillation with cardioversion in 2018 and 2020, chronic heart failure, coronary artery disease s/p Coronary angioplasty in 2018, complex valvular heart disease,  moderate pulmonary hypertension, venous insufficiency, and Dyspnea on exertion   PAIN:  Are you having pain? no NPRS scale: 0/10 pain in hip and bilat knees     PRECAUTIONS: Other: Parkinson's.  Pt uses a walker.  Chronic heart failure, A-fib    WEIGHT BEARING RESTRICTIONS No   FALLS:  Has patient fallen in last 6 months? No    PLOF: Independent; Pt ambulates with a FWW.     PATIENT GOALS improve strength  in LE's, walk without walker     OBJECTIVE:    DIAGNOSTIC FINDINGS: N/A   TODAY'S TREATMENT:  GAIT: Assistive device utilized: Environmental consultant - 2 wheeled Comments: Pt ambulates with a FWW with bilat knees flexed and fwd flexed/stooped over posture with increased Wb'ing thru UE's.     Standing posture:  Rightward lean with R knee flexed    Therapeutic Exercise: Reviewed response to prior Rx, pain level, and current function.  Pt performed:            NuStep at L3 x 6 mins with UE/LE's  LAQ with 3# 3 x 10 reps on R, 2# 4x10 on L             Seated marching 3x10 reps each with 4# on R and with 3# on L             Seated HS curls with GTB 3x10 on R and with RTB 3 x 10 on L  Seated hip abd with RTB on R and YTB on L 2x10            Low level marching on airex with UE assistance with CGA 2 x 10-15 reps                Therapeutic Activities:  for improved functional mobility  Pt performed:  Sidestepping with CGA with Ue's at rail    Sit to stands 3  x 10 reps from hi/low table with UE's on knees  Step ups on 4 inch step with CGA and bilat UE assist on rail x10 reps bilat        PATIENT EDUCATION:  Education details:  instructed to wear compression socks.  exercise form and POC.   Person educated: Patient  Education method: Explanation, demonstration, verbal and visual cues Education comprehension: verbalized understanding, returned demonstration, verbal and visual cues required, needs further instruction     HOME EXERCISE PROGRAM: Access Code: [email or web address] URL: [email or web address] Date: 05/12/2021 Prepared by: Ronny Flurry  Exercises Seated March - 1-2 x daily - 7 x weekly - 1-2 sets - 10 reps Seated Long Arc Quad - 1-2 x daily - 7 x weekly - 1-2 sets - 10 reps Seated Isometric Hip Abduction - 1 x daily - 7 x weekly - 2 sets - 5 reps - 5 seconds hold     ASSESSMENT:   CLINICAL IMPRESSION: Pt presents to Rx denying any pain.  He states he is improving including standing activities and performing sit to stand transfers.  Pt is improving with LE strength as evidenced by performance of seated resistance exercises.  He does require some cuing for correct form.  He continues to have gait deficits including flexed over posture and significantly limited knee extension bilat.  He does require FWW to ambulate.  He continues to have postural deficits with standing and requires frequent cuing to straighten R knee and to not lean to the R.  He is able to perform 4 inch step ups bilat though does require UE assistance.  He gives great effort with all exercises.  Pt responded well to Rx having no pain and no c/o's after Rx.       OBJECTIVE IMPAIRMENTS Abnormal gait, decreased activity tolerance, decreased balance, decreased endurance, decreased mobility, difficulty walking, decreased ROM, decreased strength, hypomobility, impaired flexibility, and pain.    ACTIVITY LIMITATIONS cleaning, community activity, meal  prep, shopping, and ambulating and standing activities .    PERSONAL FACTORS Age and 3+ comorbidities: Parkinson's ;  Hx of bilat knee pain and OA ; neuropathy ; left hip intertrochanteric Fx with femur IM nailing 05/20/2020 ; A-fib, chronic heart failure, venous insufficiency, and DOE.  are also affecting patient's functional outcome.      REHAB POTENTIAL: Good   CLINICAL DECISION MAKING: Evolving/moderate complexity   EVALUATION COMPLEXITY: Moderate     GOALS:   SHORT TERM GOALS:   Pt will be independent and compliant with HEP for improved pain, strength, mobility, and function.   Baseline:  Target date: 05/26/2021 Goal status: GOAL MET    2.  Pt or wife will report at least a 25% improvement in mobility.  Baseline:  Target date: 05/26/2021 Goal status: GOAL MET   3.  Pt will demo improved posture with gait including standing up straighter and improved knee extension bilat for improved quality of gait Baseline:  Target date: 06/02/2021 Goal status: NOT MET   4.  Pt will report at least a 25% reduction in hip pain with standing activities and mobility. Baseline:  Target date: 06/02/2021 Goal status: GOAL MET       LONG TERM GOALS:   Pt will demo improved bilat hip strength to 4+/5 MMT, L knee ext to 5/5, bilat hip abd to be [facility] in sitting, and L hip ER to 4/5 MMT for improved performance of functional mobility Baseline:  Target date: 08/03/2021 Goal status: ONGOING   2.  Pt will perform 5x STS test with light UE support in no > 16 seconds for improved fxnl LE strength and performance of transfers. Baseline:  Target date: 08/03/2021 Goal status: PROGRESSING   3.  Pt will ambulate community distance safely with FWW without significant hip pain. Baseline:  Target date: 08/03/2021 Goal status:  PROGRESSING   4.  Pt will have improved tolerance with standing to perform household chores including being able to prepare food without significant pain or difficulty.  Baseline:   Target date: 08/03/2021 Goal status:  ONGOING       PLAN: PT FREQUENCY: 2x/week   PT DURATION: 6 weeks   PLANNED INTERVENTIONS: Therapeutic exercises, Therapeutic activity, Neuromuscular re-education, Balance training, Gait training, Patient/Family education, Joint mobilization, Stair training, Cryotherapy, Moist heat, Taping, and Manual therapy   PLAN FOR NEXT SESSION: Cont with LE strengthening and balance activities.      Selinda Michaels III PT, DPT 07/16/21 3:53 PM

## 2021-07-22 ENCOUNTER — Ambulatory Visit (HOSPITAL_BASED_OUTPATIENT_CLINIC_OR_DEPARTMENT_OTHER): Payer: Medicare Other | Admitting: Physical Therapy

## 2021-07-22 DIAGNOSIS — R278 Other lack of coordination: Secondary | ICD-10-CM

## 2021-07-22 DIAGNOSIS — R262 Difficulty in walking, not elsewhere classified: Secondary | ICD-10-CM

## 2021-07-22 DIAGNOSIS — M25552 Pain in left hip: Secondary | ICD-10-CM | POA: Diagnosis not present

## 2021-07-22 DIAGNOSIS — M6281 Muscle weakness (generalized): Secondary | ICD-10-CM | POA: Diagnosis not present

## 2021-07-22 NOTE — Therapy (Signed)
OUTPATIENT PHYSICAL THERAPY TREATMENT NOTE      Patient Name: [name] MRN: [id number] DOB:1935/11/22, 86 y.o., male Today's Date: 07/23/2021  PCP: Carol Ada, MD   PT End of Session - 07/22/21 1201     Visit Number 16    Number of Visits 21    Date for PT Re-Evaluation 08/03/21    Authorization Type MCR A and B    PT Start Time 1101    PT Stop Time 0354    PT Time Calculation (min) 44 min    Equipment Utilized During Treatment Gait belt    Activity Tolerance Patient tolerated treatment well    Behavior During Therapy WFL for tasks assessed/performed                       Past Medical History:  Diagnosis Date   Arthritis    "knees; lower back" (08/09/2016)   Atrial fibrillation ([place])    remote hx/notes 08/07/2016   Coronary artery disease    Episodes of trembling    "most of the time it's my left hand" (08/09/2016)   GI bleeding 08/2017   High cholesterol    History of kidney stones    Hypertension    Melanoma of shoulder ([place]) 2000s   "left"   Mitral regurgitation and mitral stenosis    hx/notes 08/07/2016   Moderate aortic stenosis    hx/notes 08/07/2016   Parkinson's disease ([place])    Prostate cancer ([place]) 2011   hx/notes 08/07/2016   Past Surgical History:  Procedure Laterality Date   CARDIAC CATHETERIZATION  ~ [address], [place]"   CARDIOVERSION N/A 01/26/2017   Procedure: CARDIOVERSION;  Surgeon: [name], MD;  Location: [facility];  Service: Cardiovascular;  Laterality: N/A;   CARDIOVERSION N/A 03/06/2018   Procedure: CARDIOVERSION;  Surgeon: [name], MD;  Location: [facility];  Service: Cardiovascular;  Laterality: N/A;   CATARACT EXTRACTION W/ INTRAOCULAR LENS  IMPLANT, BILATERAL Bilateral 2014-02/2016   left-right; hx/notes 08/07/2016   COLONOSCOPY WITH PROPOFOL N/A 09/25/2017   Procedure: COLONOSCOPY WITH PROPOFOL;  Surgeon: Laurence Spates, MD;  Location: [facility];  Service: Endoscopy;   Laterality: N/A;   CORONARY ANGIOPLASTY WITH STENT PLACEMENT  08/09/2016   CORONARY ATHERECTOMY N/A 08/09/2016   Procedure: Coronary Atherectomy;  Surgeon: Adrian Prows, MD;  Location: [facility];  Service: Cardiovascular;  Laterality: N/A;   CORONARY STENT INTERVENTION N/A 08/09/2016   Procedure: Coronary Stent Intervention;  Surgeon: Adrian Prows, MD;  Location: [facility];  Service: Cardiovascular;  Laterality: N/A;  LAD   EYE SURGERY Bilateral 05/2016   "laser on both lenses"   FEMUR IM NAIL Left 05/20/2020   Procedure: INTRAMEDULLARY (IM) NAIL FEMORAL;  Surgeon: Rod Can, MD;  Location: [facility];  Service: Orthopedics;  Laterality: Left;   HEMORRHOID SURGERY N/A 09/27/2017   Procedure: HEMORRHOIDECTOMY;  Surgeon: Erroll Luna, MD;  Location: [facility];  Service: General;  Laterality: N/A;   INGUINAL HERNIA REPAIR Left [place]  06/25/2009   INTRAVASCULAR PRESSURE WIRE/FFR STUDY N/A 08/09/2016   Procedure: Intravascular Pressure Wire/FFR Study;  Surgeon: Adrian Prows, MD;  Location: [facility];  Service: Cardiovascular;  Laterality: N/A;   [place] Left 1990s   "shoulder"   MELANOMA EXCISION Right 08/2018   right shoulder   MELANOMA EXCISION  04/20/2021   RIGHT HEART CATH N/A 03/20/2018   Procedure:  RIGHT HEART CATH;  Surgeon: [name], MD;  Location: [facility];  Service: Cardiovascular;  Laterality: N/A;   RIGHT/LEFT HEART CATH AND CORONARY ANGIOGRAPHY N/A 08/09/2016   Procedure: Right/Left Heart Cath and Coronary Angiography;  Surgeon: Adrian Prows, MD;  Location: [facility];  Service: Cardiovascular;  Laterality: N/A;   SURGERY FOR BROKEN FEMUR Left 05/24/2020   TEE WITHOUT CARDIOVERSION N/A 01/25/2017   Procedure: TRANSESOPHAGEAL ECHOCARDIOGRAM (TEE);  Surgeon: [name], MD;  Location: [facility];  Service: Cardiovascular;  Laterality: N/A;    Patient Active Problem List   Diagnosis Date Noted   Chronic heart failure with preserved ejection fraction ([place]) 06/17/2020   Elevated hemidiaphragm    Closed fracture of left femur ([facility])    Pain    Hypokalemia 05/19/2020   Dilated cardiomyopathy ([place]) 05/19/2020   CKD (chronic kidney disease), stage III ([place]) 05/19/2020   Parkinson's disease ([place]) 05/19/2020   Closed left hip fracture ([place]) 05/18/2020   Intertrochanteric fracture ([place]) 05/18/2020   Essential hypertension 07/21/2018   Bilateral leg edema 07/21/2018   Laboratory examination 05/14/2018   Pulmonary hypertension ([place]) 03/16/2018   Nonrheumatic mitral valve regurgitation 03/16/2018   Tricuspid regurgitation 03/16/2018   CKD (chronic kidney disease) stage 3, GFR 30-59 ml/min ([facility]) 09/26/2017   Chronic anticoagulation 09/26/2017   Atrial fibrillation ([place]) 09/26/2017   Atrial flutter ([place]) 02/11/2017   Post PTCA 08/09/2016   Dyspnea on exertion 08/07/2016   Coronary artery disease involving native coronary artery of native heart without angina pectoris 08/07/2016    REFERRING PROVIDER: Rod Can, MD   REFERRING DIAG: M70.62 (ICD-10-CM) - Trochanteric bursitis, left hip    THERAPY DIAG:  Muscle weakness (generalized)   Other lack of coordination   Pain in left hip   Difficulty in walking, not elsewhere classified   ONSET DATE: MD order 04/19/2021   SUBJECTIVE:    SUBJECTIVE STATEMENT: -Pt is wearing compression socks today.  Pt's fiance states his leg swelling is improving with the new medication and wearing compression socks.  Pt states she weighs him every AM and he is losing the fluid weight.  Pt denies any adverse effects after prior Rx.  Pt denies pain currently.  Pt states he is pleased with how he is progressing.  Pt reports he is able to walk and stand longer.  Pt is compliant with HEP.  He is performing seated marching with theraband, standing marching with UE support, sidestepping with UE  support, and seated hip abd with theraband.   FUNCTIONAL LIMITATIONS:  ambulation.  He requires walker to ambulate.  Dragging his feet with ambulation.  limited in preparing food.  Hip pain and bilat knee pain with standing.  Pt uses a lift chair at home and has difficulty with sit/stand transfers.    PERTINENT HISTORY: -Parkinson's ;  Hx of bilat knee pain and OA ; neuropathy ; left hip intertrochanteric Fx with femur IM nailing 05/20/2020 ; CKD stage 3. -Cardiovascular Hx:  permanent atrial fibrillation with cardioversion in 2018 and 2020, chronic heart failure, coronary artery disease s/p Coronary angioplasty in 2018, complex valvular heart disease,  moderate pulmonary hypertension, venous insufficiency, and Dyspnea on exertion   PAIN:  Are you having pain? no NPRS scale: 0/10 pain in hip and bilat knees     PRECAUTIONS: Other: Parkinson's.  Pt uses a walker.  Chronic heart failure, A-fib    WEIGHT BEARING RESTRICTIONS No   FALLS:  Has patient fallen in last 6 months?  No    PLOF: Independent; Pt ambulates with a FWW.     PATIENT GOALS improve strength in LE's, walk without walker     OBJECTIVE:    DIAGNOSTIC FINDINGS: N/A   TODAY'S TREATMENT:  GAIT: Assistive device utilized: Environmental consultant - 2 wheeled Comments: Pt ambulates with a FWW with bilat knees flexed and fwd flexed/stooped over posture with increased Wb'ing thru UE's.     Standing posture:  Rightward lean with R knee flexed    Therapeutic Exercise: Reviewed response to prior Rx, pain level, and current function.  Pt performed:            NuStep at L4 x 6 mins with UE/LE's  LAQ with 3# on R, 2# on L 2x10 bilat and then with YTB x 10 reps bilat             Seated marching 3x10 reps each with 4# on R and with 3# on L             Seated HS curls with GTB 3x10 on R and with RTB 3 x 10 on L  Seated hip abd with RTB on R and YTB on L 2x10                         Therapeutic Activities:  for improved functional mobility   Pt performed:  Sidestepping with CGA with Ue's at rail x 2 laps   Step ups on 4 inch step with CGA and bilat UE assist on rail x10-12 reps bilat        PATIENT EDUCATION:  Education details:  instructed to wear compression socks.  exercise form and POC.   Person educated: Patient  Education method: Explanation, demonstration, verbal and visual cues Education comprehension: verbalized understanding, returned demonstration, verbal and visual cues required, needs further instruction     HOME EXERCISE PROGRAM: Access Code: [email or web address] URL: [email or web address] Date: 05/12/2021 Prepared by: Ronny Flurry  Exercises Seated March - 1-2 x daily - 7 x weekly - 1-2 sets - 10 reps Seated Long Arc Quad - 1-2 x daily - 7 x weekly - 1-2 sets - 10 reps Seated Isometric Hip Abduction - 1 x daily - 7 x weekly - 2 sets - 5 reps - 5 seconds hold     ASSESSMENT:   CLINICAL IMPRESSION: Pt has improved swelling in bilat LE's today.  He has been taking a new medication and been wearing compression socks.  Pt is very motivated and gives great effort with all exercises.  Pt is improving with LE strength as evidenced by performance of exercises.  He continues to lack knee extension though has improved form with seated exercises having reduced bwd lean.  Pt continues to have difficulty with actively performing L hip abd against band though is improving.  Attempted knee extension with theraband in order to try to teach to perform at home though Pt has more difficulty with T band knee ext than with ankle weight.  Pt unable to perform LAQ with RTB.  He continues to have gait deficits including flexed over posture, shuffling gait, and significantly limited knee extension bilat.  He does require FWW to ambulate.  He is able to perform 4 inch step ups bilat though does require UE assistance.  Pt responded well to Rx having no pain and no c/o's after Rx.          OBJECTIVE IMPAIRMENTS Abnormal gait,  decreased activity tolerance, decreased balance, decreased  endurance, decreased mobility, difficulty walking, decreased ROM, decreased strength, hypomobility, impaired flexibility, and pain.    ACTIVITY LIMITATIONS cleaning, community activity, meal prep, shopping, and ambulating and standing activities .    PERSONAL FACTORS Age and 3+ comorbidities: Parkinson's ;  Hx of bilat knee pain and OA ; neuropathy ; left hip intertrochanteric Fx with femur IM nailing 05/20/2020 ; A-fib, chronic heart failure, venous insufficiency, and DOE.  are also affecting patient's functional outcome.      REHAB POTENTIAL: Good   CLINICAL DECISION MAKING: Evolving/moderate complexity   EVALUATION COMPLEXITY: Moderate     GOALS:   SHORT TERM GOALS:   Pt will be independent and compliant with HEP for improved pain, strength, mobility, and function.   Baseline:  Target date: 05/26/2021 Goal status: GOAL MET    2.  Pt or wife will report at least a 25% improvement in mobility.  Baseline:  Target date: 05/26/2021 Goal status: GOAL MET   3.  Pt will demo improved posture with gait including standing up straighter and improved knee extension bilat for improved quality of gait Baseline:  Target date: 06/02/2021 Goal status: NOT MET   4.  Pt will report at least a 25% reduction in hip pain with standing activities and mobility. Baseline:  Target date: 06/02/2021 Goal status: GOAL MET       LONG TERM GOALS:   Pt will demo improved bilat hip strength to 4+/5 MMT, L knee ext to 5/5, bilat hip abd to be [facility] in sitting, and L hip ER to 4/5 MMT for improved performance of functional mobility Baseline:  Target date: 08/03/2021 Goal status: ONGOING   2.  Pt will perform 5x STS test with light UE support in no > 16 seconds for improved fxnl LE strength and performance of transfers. Baseline:  Target date: 08/03/2021 Goal status: PROGRESSING   3.  Pt will ambulate community distance safely with FWW without  significant hip pain. Baseline:  Target date: 08/03/2021 Goal status:  PROGRESSING   4.  Pt will have improved tolerance with standing to perform household chores including being able to prepare food without significant pain or difficulty.  Baseline:  Target date: 08/03/2021 Goal status:  ONGOING       PLAN: PT FREQUENCY: 2x/week   PT DURATION: 6 weeks   PLANNED INTERVENTIONS: Therapeutic exercises, Therapeutic activity, Neuromuscular re-education, Balance training, Gait training, Patient/Family education, Joint mobilization, Stair training, Cryotherapy, Moist heat, Taping, and Manual therapy   PLAN FOR NEXT SESSION: Cont with LE strengthening and balance activities.      Selinda Michaels III PT, DPT 07/23/21 8:49 AM

## 2021-07-23 ENCOUNTER — Encounter (HOSPITAL_BASED_OUTPATIENT_CLINIC_OR_DEPARTMENT_OTHER): Payer: Self-pay | Admitting: Physical Therapy

## 2021-07-27 ENCOUNTER — Encounter (HOSPITAL_BASED_OUTPATIENT_CLINIC_OR_DEPARTMENT_OTHER): Payer: Self-pay | Admitting: Physical Therapy

## 2021-07-27 ENCOUNTER — Ambulatory Visit (HOSPITAL_BASED_OUTPATIENT_CLINIC_OR_DEPARTMENT_OTHER): Payer: Medicare Other | Admitting: Physical Therapy

## 2021-07-27 DIAGNOSIS — R262 Difficulty in walking, not elsewhere classified: Secondary | ICD-10-CM | POA: Diagnosis not present

## 2021-07-27 DIAGNOSIS — R278 Other lack of coordination: Secondary | ICD-10-CM

## 2021-07-27 DIAGNOSIS — M6281 Muscle weakness (generalized): Secondary | ICD-10-CM | POA: Diagnosis not present

## 2021-07-27 DIAGNOSIS — M25552 Pain in left hip: Secondary | ICD-10-CM | POA: Diagnosis not present

## 2021-07-27 NOTE — Therapy (Signed)
OUTPATIENT PHYSICAL THERAPY TREATMENT NOTE      Patient Name: [name] MRN: [id number] DOB:1935/08/15, 86 y.o., male Today's Date: 07/27/2021  PCP: Carol Ada, MD   PT End of Session - 07/27/21 1118     Visit Number 17    Number of Visits 21    Date for PT Re-Evaluation 08/03/21    Authorization Type MCR A and B    Progress Note Due on Visit 61    PT Start Time 1112    PT Stop Time 6415    PT Time Calculation (min) 42 min    Equipment Utilized During Treatment Gait belt    Activity Tolerance Patient tolerated treatment well    Behavior During Therapy WFL for tasks assessed/performed                       Past Medical History:  Diagnosis Date   Arthritis    "knees; lower back" (08/09/2016)   Atrial fibrillation ([place])    remote hx/notes 08/07/2016   Coronary artery disease    Episodes of trembling    "most of the time it's my left hand" (08/09/2016)   GI bleeding 08/2017   High cholesterol    History of kidney stones    Hypertension    Melanoma of shoulder ([place]) 2000s   "left"   Mitral regurgitation and mitral stenosis    hx/notes 08/07/2016   Moderate aortic stenosis    hx/notes 08/07/2016   Parkinson's disease ([place])    Prostate cancer ([place]) 2011   hx/notes 08/07/2016   Past Surgical History:  Procedure Laterality Date   CARDIAC CATHETERIZATION  ~ [address], [place]"   CARDIOVERSION N/A 01/26/2017   Procedure: CARDIOVERSION;  Surgeon: [name], MD;  Location: [facility];  Service: Cardiovascular;  Laterality: N/A;   CARDIOVERSION N/A 03/06/2018   Procedure: CARDIOVERSION;  Surgeon: [name], MD;  Location: [facility];  Service: Cardiovascular;  Laterality: N/A;   CATARACT EXTRACTION W/ INTRAOCULAR LENS  IMPLANT, BILATERAL Bilateral 2014-02/2016   left-right; hx/notes 08/07/2016   COLONOSCOPY WITH PROPOFOL N/A 09/25/2017   Procedure: COLONOSCOPY WITH PROPOFOL;  Surgeon: Laurence Spates, MD;  Location: [facility];  Service: Endoscopy;  Laterality: N/A;   CORONARY ANGIOPLASTY WITH STENT PLACEMENT  08/09/2016   CORONARY ATHERECTOMY N/A 08/09/2016   Procedure: Coronary Atherectomy;  Surgeon: Adrian Prows, MD;  Location: [facility];  Service: Cardiovascular;  Laterality: N/A;   CORONARY STENT INTERVENTION N/A 08/09/2016   Procedure: Coronary Stent Intervention;  Surgeon: Adrian Prows, MD;  Location: [facility];  Service: Cardiovascular;  Laterality: N/A;  LAD   EYE SURGERY Bilateral 05/2016   "laser on both lenses"   FEMUR IM NAIL Left 05/20/2020   Procedure: INTRAMEDULLARY (IM) NAIL FEMORAL;  Surgeon: Rod Can, MD;  Location: [facility];  Service: Orthopedics;  Laterality: Left;   HEMORRHOID SURGERY N/A 09/27/2017   Procedure: HEMORRHOIDECTOMY;  Surgeon: Erroll Luna, MD;  Location: [facility];  Service: General;  Laterality: N/A;   INGUINAL HERNIA REPAIR Left [place]  06/25/2009   INTRAVASCULAR PRESSURE WIRE/FFR STUDY N/A 08/09/2016   Procedure: Intravascular Pressure Wire/FFR Study;  Surgeon: Adrian Prows, MD;  Location: [facility];  Service: Cardiovascular;  Laterality: N/A;   LAPAROSCOPIC CHOLECYSTECTOMY  1993   MELANOMA EXCISION Left 1990s   "shoulder"   MELANOMA EXCISION Right 08/2018   right shoulder   MELANOMA EXCISION  04/20/2021  RIGHT HEART CATH N/A 03/20/2018   Procedure: RIGHT HEART CATH;  Surgeon: [name], MD;  Location: [facility];  Service: Cardiovascular;  Laterality: N/A;   RIGHT/LEFT HEART CATH AND CORONARY ANGIOGRAPHY N/A 08/09/2016   Procedure: Right/Left Heart Cath and Coronary Angiography;  Surgeon: Adrian Prows, MD;  Location: [facility];  Service: Cardiovascular;  Laterality: N/A;   SURGERY FOR BROKEN FEMUR Left 05/24/2020   TEE WITHOUT CARDIOVERSION N/A 01/25/2017   Procedure: TRANSESOPHAGEAL ECHOCARDIOGRAM (TEE);  Surgeon: [name], MD;  Location: [facility];  Service:  Cardiovascular;  Laterality: N/A;   Patient Active Problem List   Diagnosis Date Noted   Chronic heart failure with preserved ejection fraction ([place]) 06/17/2020   Elevated hemidiaphragm    Closed fracture of left femur ([facility])    Pain    Hypokalemia 05/19/2020   Dilated cardiomyopathy ([place]) 05/19/2020   CKD (chronic kidney disease), stage III ([place]) 05/19/2020   Parkinson's disease ([place]) 05/19/2020   Closed left hip fracture ([place]) 05/18/2020   Intertrochanteric fracture ([place]) 05/18/2020   Essential hypertension 07/21/2018   Bilateral leg edema 07/21/2018   Laboratory examination 05/14/2018   Pulmonary hypertension ([place]) 03/16/2018   Nonrheumatic mitral valve regurgitation 03/16/2018   Tricuspid regurgitation 03/16/2018   CKD (chronic kidney disease) stage 3, GFR 30-59 ml/min ([facility]) 09/26/2017   Chronic anticoagulation 09/26/2017   Atrial fibrillation ([place]) 09/26/2017   Atrial flutter ([place]) 02/11/2017   Post PTCA 08/09/2016   Dyspnea on exertion 08/07/2016   Coronary artery disease involving native coronary artery of native heart without angina pectoris 08/07/2016    REFERRING PROVIDER: Rod Can, MD   REFERRING DIAG: M70.62 (ICD-10-CM) - Trochanteric bursitis, left hip    THERAPY DIAG:  Muscle weakness (generalized)   Other lack of coordination   Pain in left hip   Difficulty in walking, not elsewhere classified   ONSET DATE: MD order 04/19/2021   SUBJECTIVE:    SUBJECTIVE STATEMENT: -Pt is wearing compression socks today.  Pt denies any adverse effects after prior Rx.  Pt denies pain currently.  Pt states he is pleased with how he is progressing.  Pt reports he is able to walk and stand longer.  Pt is compliant with HEP.   Pt states he is consistent with exercises and has increased his recumbent bike riding to 2 miles daily.  Pt states he is walking better.    FUNCTIONAL LIMITATIONS:  ambulation.  He requires walker to ambulate.  Dragging his feet with ambulation.   limited in preparing food.  Hip pain and bilat knee pain with standing.  Pt uses a lift chair at home and has difficulty with sit/stand transfers.    PERTINENT HISTORY: -Parkinson's ;  Hx of bilat knee pain and OA ; neuropathy ; left hip intertrochanteric Fx with femur IM nailing 05/20/2020 ; CKD stage 3. -Cardiovascular Hx:  permanent atrial fibrillation with cardioversion in 2018 and 2020, chronic heart failure, coronary artery disease s/p Coronary angioplasty in 2018, complex valvular heart disease,  moderate pulmonary hypertension, venous insufficiency, and Dyspnea on exertion   PAIN:  Are you having pain? no NPRS scale: 0/10 pain in hip and bilat knees     PRECAUTIONS: Other: Parkinson's.  Pt uses a walker.  Chronic heart failure, A-fib    WEIGHT BEARING RESTRICTIONS No   FALLS:  Has patient fallen in last 6 months? No    PLOF: Independent; Pt ambulates with a FWW.     PATIENT GOALS improve strength  in LE's, walk without walker     OBJECTIVE:    DIAGNOSTIC FINDINGS: N/A   TODAY'S TREATMENT:  GAIT: Assistive device utilized: Environmental consultant - 2 wheeled Comments: Pt ambulates with a FWW with bilat knees flexed and fwd flexed/stooped over posture with increased Wb'ing thru UE's.     Standing posture:  Rightward lean with R knee flexed    Therapeutic Exercise: Reviewed response to prior Rx, pain level, and current function.  Pt performed:            LAQ with 3# on R, 2# on L 3x10 bilat each            Seated marching 3x10 reps each with 4# on R and with 3# on L             Seated HS curls with GTB 2x10 on R and with RTB 2 x 10 on L Seated Hip abd with RTB 2x10 on R and 1x10 on L                           Therapeutic Activities:  for improved functional mobility  Pt performed:  Sit to stands 2x15 reps with cuing to increase knee extension and stand up straight Sidestepping with CGA with Ue's at rail x 2 laps   Step ups on 4 inch step with CGA and bilat UE assist on rail 2x5  reps on R and 1x3 reps on L  Neuro Re-ed Activities:  to improve functional stability, mobility, and kinesthetic awareness with daily tasks and mobility.  Standing on floor x 1 min without UE assist with CGA and on airex 2x30 sec without UE assist with CGA.   With cuing to extend R knee and stand straight   Marching on airex 2x10 with UE assist  Alt toe tapping 3x10 on 6 inch step with bilat UE assist        PATIENT EDUCATION:  Education details:  instructed to wear compression socks.  exercise form and POC.   Person educated: Patient  Education method: Explanation, demonstration, verbal and visual cues Education comprehension: verbalized understanding, returned demonstration, verbal and visual cues required, needs further instruction     HOME EXERCISE PROGRAM: Access Code: [email or web address] URL: [email or web address] Date: 05/12/2021 Prepared by: Ronny Flurry  Exercises Seated March - 1-2 x daily - 7 x weekly - 1-2 sets - 10 reps Seated Long Arc Quad - 1-2 x daily - 7 x weekly - 1-2 sets - 10 reps Seated Isometric Hip Abduction - 1 x daily - 7 x weekly - 2 sets - 5 reps - 5 seconds hold     ASSESSMENT:   CLINICAL IMPRESSION: Pt reports improved ambulation and is pleased with his improvement.  Pt is very motivated and gives great effort with all exercises.  Pt is improving with LE strength as evidenced by performance of exercises.  Pt performs improved bilat hip abd strength as evidenced by increased hip abduction movement against band.  Pt had some pain in R knee with step ups and PT had pt stop with R LE.  He continues to have gait deficits including flexed over posture, shuffling gait, and significantly limited knee extension bilat.  He does require FWW to ambulate.  He has improved with standing duration without UE support though continues to have the same postural deficits of learning to R with increased knee flexion.  Pt improves posture with cuing.  Pt responded well to  Rx  having no pain and no c/o's after Rx.              OBJECTIVE IMPAIRMENTS Abnormal gait, decreased activity tolerance, decreased balance, decreased endurance, decreased mobility, difficulty walking, decreased ROM, decreased strength, hypomobility, impaired flexibility, and pain.    ACTIVITY LIMITATIONS cleaning, community activity, meal prep, shopping, and ambulating and standing activities .    PERSONAL FACTORS Age and 3+ comorbidities: Parkinson's ;  Hx of bilat knee pain and OA ; neuropathy ; left hip intertrochanteric Fx with femur IM nailing 05/20/2020 ; A-fib, chronic heart failure, venous insufficiency, and DOE.  are also affecting patient's functional outcome.      REHAB POTENTIAL: Good   CLINICAL DECISION MAKING: Evolving/moderate complexity   EVALUATION COMPLEXITY: Moderate     GOALS:   SHORT TERM GOALS:   Pt will be independent and compliant with HEP for improved pain, strength, mobility, and function.   Baseline:  Target date: 05/26/2021 Goal status: GOAL MET    2.  Pt or wife will report at least a 25% improvement in mobility.  Baseline:  Target date: 05/26/2021 Goal status: GOAL MET   3.  Pt will demo improved posture with gait including standing up straighter and improved knee extension bilat for improved quality of gait Baseline:  Target date: 06/02/2021 Goal status: NOT MET   4.  Pt will report at least a 25% reduction in hip pain with standing activities and mobility. Baseline:  Target date: 06/02/2021 Goal status: GOAL MET       LONG TERM GOALS:   Pt will demo improved bilat hip strength to 4+/5 MMT, L knee ext to 5/5, bilat hip abd to be [facility] in sitting, and L hip ER to 4/5 MMT for improved performance of functional mobility Baseline:  Target date: 08/03/2021 Goal status: ONGOING   2.  Pt will perform 5x STS test with light UE support in no > 16 seconds for improved fxnl LE strength and performance of transfers. Baseline:  Target date: 08/03/2021 Goal  status: PROGRESSING   3.  Pt will ambulate community distance safely with FWW without significant hip pain. Baseline:  Target date: 08/03/2021 Goal status:  PROGRESSING   4.  Pt will have improved tolerance with standing to perform household chores including being able to prepare food without significant pain or difficulty.  Baseline:  Target date: 08/03/2021 Goal status:  ONGOING       PLAN: PT FREQUENCY: 2x/week   PT DURATION: 6 weeks   PLANNED INTERVENTIONS: Therapeutic exercises, Therapeutic activity, Neuromuscular re-education, Balance training, Gait training, Patient/Family education, Joint mobilization, Stair training, Cryotherapy, Moist heat, Taping, and Manual therapy   PLAN FOR NEXT SESSION: Cont with LE strengthening and balance activities.      Selinda Michaels III PT, DPT 07/27/21 5:14 PM

## 2021-07-29 ENCOUNTER — Encounter (HOSPITAL_BASED_OUTPATIENT_CLINIC_OR_DEPARTMENT_OTHER): Payer: Self-pay | Admitting: Physical Therapy

## 2021-07-29 ENCOUNTER — Ambulatory Visit (HOSPITAL_BASED_OUTPATIENT_CLINIC_OR_DEPARTMENT_OTHER): Payer: Medicare Other | Attending: Orthopedic Surgery | Admitting: Physical Therapy

## 2021-07-29 DIAGNOSIS — M25552 Pain in left hip: Secondary | ICD-10-CM | POA: Insufficient documentation

## 2021-07-29 DIAGNOSIS — R278 Other lack of coordination: Secondary | ICD-10-CM | POA: Insufficient documentation

## 2021-07-29 DIAGNOSIS — M6281 Muscle weakness (generalized): Secondary | ICD-10-CM | POA: Insufficient documentation

## 2021-07-29 DIAGNOSIS — R262 Difficulty in walking, not elsewhere classified: Secondary | ICD-10-CM | POA: Insufficient documentation

## 2021-07-29 NOTE — Therapy (Signed)
OUTPATIENT PHYSICAL THERAPY TREATMENT NOTE      Patient Name: [name] MRN: [id number] DOB:1935-05-31, 86 y.o., male Today's Date: 07/29/2021  PCP: Carol Ada, MD               Past Medical History:  Diagnosis Date   Arthritis    "knees; lower back" (08/09/2016)   Atrial fibrillation ([place])    remote hx/notes 08/07/2016   Coronary artery disease    Episodes of trembling    "most of the time it's my left hand" (08/09/2016)   GI bleeding 08/2017   High cholesterol    History of kidney stones    Hypertension    Melanoma of shoulder ([place]) 2000s   "left"   Mitral regurgitation and mitral stenosis    hx/notes 08/07/2016   Moderate aortic stenosis    hx/notes 08/07/2016   Parkinson's disease ([place])    Prostate cancer ([place]) 2011   hx/notes 08/07/2016   Past Surgical History:  Procedure Laterality Date   [place] [address] [place]   CARDIOVERSION N/A 01/26/2017   Procedure: CARDIOVERSION;  Surgeon: [name], MD;  Location: [facility];  Service: Cardiovascular;  Laterality: N/A;   CARDIOVERSION N/A 03/06/2018   Procedure: CARDIOVERSION;  Surgeon: [name], MD;  Location: [facility];  Service: Cardiovascular;  Laterality: N/A;   CATARACT EXTRACTION W/ INTRAOCULAR LENS  IMPLANT, BILATERAL Bilateral 2014-02/2016   left-right; hx/notes 08/07/2016   COLONOSCOPY WITH PROPOFOL N/A 09/25/2017   Procedure: COLONOSCOPY WITH PROPOFOL;  Surgeon: Laurence Spates, MD;  Location: [facility];  Service: Endoscopy;  Laterality: N/A;   CORONARY ANGIOPLASTY WITH STENT PLACEMENT  08/09/2016   CORONARY ATHERECTOMY N/A 08/09/2016   Procedure: Coronary Atherectomy;  Surgeon: Adrian Prows, MD;  Location: [facility];  Service: Cardiovascular;  Laterality: N/A;   CORONARY STENT INTERVENTION N/A 08/09/2016   Procedure: Coronary Stent Intervention;  Surgeon: Adrian Prows, MD;  Location: [facility];  Service:  Cardiovascular;  Laterality: N/A;  LAD   EYE SURGERY Bilateral 05/2016   "laser on both lenses"   FEMUR IM NAIL Left 05/20/2020   Procedure: INTRAMEDULLARY (IM) NAIL FEMORAL;  Surgeon: Rod Can, MD;  Location: [facility];  Service: Orthopedics;  Laterality: Left;   HEMORRHOID SURGERY N/A 09/27/2017   Procedure: HEMORRHOIDECTOMY;  Surgeon: Erroll Luna, MD;  Location: [facility];  Service: General;  Laterality: N/A;   INGUINAL HERNIA REPAIR Left [place]  06/25/2009   INTRAVASCULAR PRESSURE WIRE/FFR STUDY N/A 08/09/2016   Procedure: Intravascular Pressure Wire/FFR Study;  Surgeon: Adrian Prows, MD;  Location: [facility];  Service: Cardiovascular;  Laterality: N/A;   [place] Left 1990s   "shoulder"   MELANOMA EXCISION Right 08/2018   right shoulder   MELANOMA EXCISION  04/20/2021   RIGHT HEART CATH N/A 03/20/2018   Procedure: RIGHT HEART CATH;  Surgeon: [name], MD;  Location: [facility];  Service: Cardiovascular;  Laterality: N/A;   RIGHT/LEFT HEART CATH AND CORONARY ANGIOGRAPHY N/A 08/09/2016   Procedure: Right/Left Heart Cath and Coronary Angiography;  Surgeon: Adrian Prows, MD;  Location: [facility];  Service: Cardiovascular;  Laterality: N/A;   SURGERY FOR BROKEN FEMUR Left 05/24/2020   TEE WITHOUT CARDIOVERSION N/A 01/25/2017   Procedure: TRANSESOPHAGEAL ECHOCARDIOGRAM (TEE);  Surgeon: [name], MD;  Location: [facility];  Service: Cardiovascular;  Laterality: N/A;   Patient Active Problem  List   Diagnosis Date Noted   Chronic heart failure with preserved ejection fraction (HCC) 06/17/2020   Elevated hemidiaphragm    Closed fracture of left femur (HCC)    Pain    Hypokalemia 05/19/2020   Dilated cardiomyopathy ([employer]) 05/19/2020   CKD (chronic kidney disease), stage III ([place]) 05/19/2020   Parkinson's disease ([place]) 05/19/2020   Closed left hip fracture  ([place]) 05/18/2020   Intertrochanteric fracture ([place]) 05/18/2020   Essential hypertension 07/21/2018   Bilateral leg edema 07/21/2018   Laboratory examination 05/14/2018   Pulmonary hypertension ([place]) 03/16/2018   Nonrheumatic mitral valve regurgitation 03/16/2018   Tricuspid regurgitation 03/16/2018   CKD (chronic kidney disease) stage 3, GFR 30-59 ml/min (HCC) 09/26/2017   Chronic anticoagulation 09/26/2017   Atrial fibrillation ([place]) 09/26/2017   Atrial flutter ([place]) 02/11/2017   Post PTCA 08/09/2016   Dyspnea on exertion 08/07/2016   Coronary artery disease involving native coronary artery of native heart without angina pectoris 08/07/2016    REFERRING PROVIDER: Rod Can, MD   REFERRING DIAG: M70.62 (ICD-10-CM) - Trochanteric bursitis, left hip    THERAPY DIAG:  Muscle weakness (generalized)   Other lack of coordination   Pain in left hip   Difficulty in walking, not elsewhere classified   ONSET DATE: MD order 04/19/2021   SUBJECTIVE:    SUBJECTIVE STATEMENT: -Pt is not wearing compression socks due to having ingrown toenail removed yesterday.  Pt denies any adverse effects after prior Rx and states he even worked out some more after prior Rx.  Pt denies pain currently.  Pt reports he is able to walk and stand longer.  Pt is compliant with HEP.   Pt has been riding recumbent bike  2 miles daily though stopped recently due to ingrown toenail being removed.  Pt states his girlfriend stated that he seems to be doing better.   Pt states he is walking better.  Pt states he is up and down constantly t/o the day.  He is primarily ambulating in home.  He does ambulate in parking lot the front of stores and uses an Transport planner while in the store.  Pt states he hasn't been having any L hip pain lately.     FUNCTIONAL LIMITATIONS:  ambulation.  He requires walker to ambulate.  Dragging his feet with ambulation.  limited in preparing food.  Hip pain and bilat knee pain with  standing.  Pt uses a lift chair at home and has difficulty with sit/stand transfers.    PERTINENT HISTORY: -Parkinson's ;  Hx of bilat knee pain and OA ; neuropathy ; left hip intertrochanteric Fx with femur IM nailing 05/20/2020 ; CKD stage 3. -Cardiovascular Hx:  permanent atrial fibrillation with cardioversion in 2018 and 2020, chronic heart failure, coronary artery disease s/p Coronary angioplasty in 2018, complex valvular heart disease,  moderate pulmonary hypertension, venous insufficiency, and Dyspnea on exertion   PAIN:  Are you having pain? no NPRS scale: 0/10 pain in hip and bilat knees     PRECAUTIONS: Other: Parkinson's.  Pt uses a walker.  Chronic heart failure, A-fib    WEIGHT BEARING RESTRICTIONS No   FALLS:  Has patient fallen in last 6 months? No    PLOF: Independent; Pt ambulates with a FWW.     PATIENT GOALS improve strength in LE's, walk without walker     OBJECTIVE:    DIAGNOSTIC FINDINGS: N/A   TODAY'S TREATMENT:  GAIT: Assistive device utilized: Environmental consultant - 2 wheeled Comments: Pt ambulates with  a FWW with bilat knees flexed and fwd flexed/stooped over posture with increased Wb'ing thru UE's.     Standing posture:  Rightward lean with R knee flexed    Therapeutic Exercise: Reviewed response to prior Rx, pain level, and current function.  Pt performed:            Nustep at L4 x 5 mins with UE/LE  LAQ with YTB x10 reps bilat, with 3# on R, 2# on L x10 reps bilat each            Seated marching with GTB x 20 reps bilat and 20 reps on R with 4# and on L with 3#            Seated HS curls with GTB 2x10 on R and with RTB 2 x 10 on L Seated Hip abd with RTB 2x10 on R and 1x10 on L Standing on airex 3x30 sec without UE assist with CGA with one occasion of min assist   With cuing to extend R knee and stand straight  PT spent time educating pt and girlfriend concerning HEP and POC.                            Therapeutic Activities:  for improved functional  mobility  Pt performed:  Sit to stands 2x10 reps with cuing to increase knee extension and stand up straight Sidestepping with CGA with Ue's at rail x 2 laps Marching on airex 2x12 with UE assist      PATIENT EDUCATION:  Education details: exercise form, HEP, and POC.   Person educated: Patient  Education method: Explanation, demonstration, verbal and visual cues Education comprehension: verbalized understanding, returned demonstration, verbal and visual cues required, needs further instruction     HOME EXERCISE PROGRAM: Access Code: [email or web address] URL: [email or web address] Date: 05/12/2021 Prepared by: Ronny Flurry  Exercises Seated March - 1-2 x daily - 7 x weekly - 1-2 sets - 10 reps Seated Long Arc Quad - 1-2 x daily - 7 x weekly - 1-2 sets - 10 reps Seated Isometric Hip Abduction - 1 x daily - 7 x weekly - 2 sets - 5 reps - 5 seconds hold     ASSESSMENT:   CLINICAL IMPRESSION: Pt is improving with functional mobility as evidenced by subjective reports of improved walking, standing, and mobility in his home.  He continues to have the same gait and postural deficits though reports improved mobility.  PT had pt perform seated exercises with ankle weights and also therabands to compare and see which is better for him to do at home.  Pt demonstrates increased L hip abd movement noted during theraband hip abd.  Pt states he can move L LE much better to the side.  PT did limit standing exercises today due to pt having an ingrown toenail removed recently.  PT spent time educating pt and girlfriend concerning HEP and POC.  Pt responded well to Rx having no pain and no c/o's after Rx.              OBJECTIVE IMPAIRMENTS Abnormal gait, decreased activity tolerance, decreased balance, decreased endurance, decreased mobility, difficulty walking, decreased ROM, decreased strength, hypomobility, impaired flexibility, and pain.    ACTIVITY LIMITATIONS cleaning, community activity,  meal prep, shopping, and ambulating and standing activities .    PERSONAL FACTORS Age and 3+ comorbidities: Parkinson's ;  Hx of bilat knee pain and OA ; neuropathy ; left hip  intertrochanteric Fx with femur IM nailing 05/20/2020 ; A-fib, chronic heart failure, venous insufficiency, and DOE.  are also affecting patient's functional outcome.      REHAB POTENTIAL: Good   CLINICAL DECISION MAKING: Evolving/moderate complexity   EVALUATION COMPLEXITY: Moderate     GOALS:   SHORT TERM GOALS:   Pt will be independent and compliant with HEP for improved pain, strength, mobility, and function.   Baseline:  Target date: 05/26/2021 Goal status: GOAL MET    2.  Pt or wife will report at least a 25% improvement in mobility.  Baseline:  Target date: 05/26/2021 Goal status: GOAL MET   3.  Pt will demo improved posture with gait including standing up straighter and improved knee extension bilat for improved quality of gait Baseline:  Target date: 06/02/2021 Goal status: NOT MET   4.  Pt will report at least a 25% reduction in hip pain with standing activities and mobility. Baseline:  Target date: 06/02/2021 Goal status: GOAL MET       LONG TERM GOALS:   Pt will demo improved bilat hip strength to 4+/5 MMT, L knee ext to 5/5, bilat hip abd to be [facility] in sitting, and L hip ER to 4/5 MMT for improved performance of functional mobility Baseline:  Target date: 08/03/2021 Goal status: ONGOING   2.  Pt will perform 5x STS test with light UE support in no > 16 seconds for improved fxnl LE strength and performance of transfers. Baseline:  Target date: 08/03/2021 Goal status: PROGRESSING   3.  Pt will ambulate community distance safely with FWW without significant hip pain. Baseline:  Target date: 08/03/2021 Goal status:  PROGRESSING   4.  Pt will have improved tolerance with standing to perform household chores including being able to prepare food without significant pain or difficulty.  Baseline:   Target date: 08/03/2021 Goal status:  ONGOING       PLAN: PT FREQUENCY: 2x/week   PT DURATION: 6 weeks   PLANNED INTERVENTIONS: Therapeutic exercises, Therapeutic activity, Neuromuscular re-education, Balance training, Gait training, Patient/Family education, Joint mobilization, Stair training, Cryotherapy, Moist heat, Taping, and Manual therapy   PLAN FOR NEXT SESSION: Cont with LE strengthening and balance activities.  Progress HEP.  Possible discharge next week.     Selinda Michaels III PT, DPT 07/29/21 3:48 PM

## 2021-08-03 ENCOUNTER — Ambulatory Visit (HOSPITAL_BASED_OUTPATIENT_CLINIC_OR_DEPARTMENT_OTHER): Payer: Medicare Other | Admitting: Physical Therapy

## 2021-08-03 ENCOUNTER — Encounter (HOSPITAL_BASED_OUTPATIENT_CLINIC_OR_DEPARTMENT_OTHER): Payer: Self-pay | Admitting: Physical Therapy

## 2021-08-03 DIAGNOSIS — M6281 Muscle weakness (generalized): Secondary | ICD-10-CM | POA: Diagnosis not present

## 2021-08-03 DIAGNOSIS — R262 Difficulty in walking, not elsewhere classified: Secondary | ICD-10-CM

## 2021-08-03 DIAGNOSIS — R278 Other lack of coordination: Secondary | ICD-10-CM

## 2021-08-03 DIAGNOSIS — M25552 Pain in left hip: Secondary | ICD-10-CM | POA: Diagnosis not present

## 2021-08-03 NOTE — Therapy (Signed)
OUTPATIENT PHYSICAL THERAPY TREATMENT NOTE      Patient Name: [name]  MRN: [id number] DOB:Feb 17, 1936, 86 y.o., male Today's Date: 08/04/2021  PCP: Carol Ada, MD   PT End of Session - 08/03/21 1524     Visit Number 19    Number of Visits 19    Authorization Type MCR A and B    PT Start Time 1440    PT Stop Time 1522    PT Time Calculation (min) 42 min    Activity Tolerance Patient tolerated treatment well    Behavior During Therapy WFL for tasks assessed/performed                        Past Medical History:  Diagnosis Date   Arthritis    "knees; lower back" (08/09/2016)   Atrial fibrillation ([place])    remote hx/notes 08/07/2016   Coronary artery disease    Episodes of trembling    "most of the time it's my left hand" (08/09/2016)   GI bleeding 08/2017   High cholesterol    History of kidney stones    Hypertension    Melanoma of shoulder ([place]) 2000s   "left"   Mitral regurgitation and mitral stenosis    hx/notes 08/07/2016   Moderate aortic stenosis    hx/notes 08/07/2016   Parkinson's disease ([place])    Prostate cancer ([place]) 2011   hx/notes 08/07/2016   Past Surgical History:  Procedure Laterality Date   [place] [address] [place] [place]   CARDIOVERSION N/A 01/26/2017   Procedure: CARDIOVERSION;  Surgeon: [name], MD;  Location: [facility];  Service: Cardiovascular;  Laterality: N/A;   CARDIOVERSION N/A 03/06/2018   Procedure: CARDIOVERSION;  Surgeon: [name], MD;  Location: [facility];  Service: Cardiovascular;  Laterality: N/A;   CATARACT EXTRACTION W/ INTRAOCULAR LENS  IMPLANT, BILATERAL Bilateral 2014-02/2016   left-right; hx/notes 08/07/2016   COLONOSCOPY WITH PROPOFOL N/A 09/25/2017   Procedure: COLONOSCOPY WITH PROPOFOL;  Surgeon: Laurence Spates, MD;  Location: [facility];  Service: Endoscopy;  Laterality: N/A;   CORONARY ANGIOPLASTY WITH STENT PLACEMENT  08/09/2016   CORONARY  ATHERECTOMY N/A 08/09/2016   Procedure: Coronary Atherectomy;  Surgeon: Adrian Prows, MD;  Location: [facility];  Service: Cardiovascular;  Laterality: N/A;   CORONARY STENT INTERVENTION N/A 08/09/2016   Procedure: Coronary Stent Intervention;  Surgeon: Adrian Prows, MD;  Location: [facility];  Service: Cardiovascular;  Laterality: N/A;  LAD   EYE SURGERY Bilateral 05/2016   "laser on both lenses"   FEMUR IM NAIL Left 05/20/2020   Procedure: INTRAMEDULLARY (IM) NAIL FEMORAL;  Surgeon: Rod Can, MD;  Location: [facility];  Service: Orthopedics;  Laterality: Left;   HEMORRHOID SURGERY N/A 09/27/2017   Procedure: HEMORRHOIDECTOMY;  Surgeon: Erroll Luna, MD;  Location: [facility];  Service: General;  Laterality: N/A;   INGUINAL HERNIA REPAIR Left [place]  06/25/2009   INTRAVASCULAR PRESSURE WIRE/FFR STUDY N/A 08/09/2016   Procedure: Intravascular Pressure Wire/FFR Study;  Surgeon: Adrian Prows, MD;  Location: [facility];  Service: Cardiovascular;  Laterality: N/A;   [place] Left 1990s   "shoulder"   MELANOMA EXCISION Right 08/2018   right shoulder   MELANOMA EXCISION  04/20/2021   RIGHT HEART CATH N/A 03/20/2018   Procedure: RIGHT HEART CATH;  Surgeon: [name], MD;  Location: [facility];  Service: Cardiovascular;  Laterality: N/A;   RIGHT/LEFT HEART CATH AND CORONARY ANGIOGRAPHY N/A 08/09/2016   Procedure: Right/Left Heart Cath and Coronary Angiography;  Surgeon: Adrian Prows, MD;  Location: [facility];  Service: Cardiovascular;  Laterality: N/A;   SURGERY FOR BROKEN FEMUR Left 05/24/2020   TEE WITHOUT CARDIOVERSION N/A 01/25/2017   Procedure: TRANSESOPHAGEAL ECHOCARDIOGRAM (TEE);  Surgeon: [name], MD;  Location: [facility];  Service: Cardiovascular;  Laterality: N/A;   Patient Active Problem List   Diagnosis Date Noted   Chronic heart failure with  preserved ejection fraction ([place]) 06/17/2020   Elevated hemidiaphragm    Closed fracture of left femur (H[facility])    Pain    Hypokalemia 05/19/2020   Dilated cardiomyopathy ([place]) 05/19/2020   CKD (chronic kidney disease), stage III ([place]) 05/19/2020   Parkinson's disease ([place]) 05/19/2020   Closed left hip fracture ([place]) 05/18/2020   Intertrochanteric fracture ([place]) 05/18/2020   Essential hypertension 07/21/2018   Bilateral leg edema 07/21/2018   Laboratory examination 05/14/2018   Pulmonary hypertension ([place]) 03/16/2018   Nonrheumatic mitral valve regurgitation 03/16/2018   Tricuspid regurgitation 03/16/2018   CKD (chronic kidney disease) stage 3, GFR 30-59 ml/min (HCC) 09/26/2017   Chronic anticoagulation 09/26/2017   Atrial fibrillation ([place]) 09/26/2017   Atrial flutter ([place]) 02/11/2017   Post PTCA 08/09/2016   Dyspnea on exertion 08/07/2016   Coronary artery disease involving native coronary artery of native heart without angina pectoris 08/07/2016    REFERRING PROVIDER: Rod Can, MD   REFERRING DIAG: M70.62 (ICD-10-CM) - Trochanteric bursitis, left hip    THERAPY DIAG:  Muscle weakness (generalized)   Other lack of coordination   Pain in left hip   Difficulty in walking, not elsewhere classified   ONSET DATE: MD order 04/19/2021   SUBJECTIVE:    SUBJECTIVE STATEMENT: -Pt is wearing compression socks.  Pt states he didn't have increased pain that evening after Rx though did have increased toe pain the following day.  Pt denies pain currently.  Pt reports he is able to walk and stand longer.  Pt is compliant with HEP.   Pt has been riding recumbent bike 2 miles daily.  Pt states his girlfriend stated that he seems to be doing better.   Pt states he is walking better. Pt states he hasn't been having any L hip pain lately.     -Pt states therapy has helped him a lot.  He reports he walked 6 steps at home without walker.  PT instructed pt he needs the walker with walking  and standing and pt agrees.  Pt states he is able to cook and prepare food without significant issues.  Pt reports he is safe with community ambulation and has no hip pain.  Pt denies any hip pain recently.  Pt reports compliance with HEP and states he has used the bands at home.  Pt states he does not need any pictures.     PERTINENT HISTORY: -Parkinson's ;  Hx of bilat knee pain and OA ; neuropathy ; left hip intertrochanteric Fx with femur IM nailing 05/20/2020 ; CKD stage 3. -Cardiovascular Hx:  permanent atrial fibrillation with cardioversion in 2018 and 2020, chronic heart failure, coronary artery disease s/p Coronary angioplasty in 2018, complex valvular heart disease,  moderate pulmonary hypertension, venous insufficiency, and Dyspnea on exertion   PAIN:  Are you having pain? no NPRS scale: 0/10 pain in hip and bilat knees     PRECAUTIONS: Other: Parkinson's.  Pt uses a  walker.  Chronic heart failure, A-fib    WEIGHT BEARING RESTRICTIONS No   FALLS:  Has patient fallen in last 6 months? No    PLOF: Independent; Pt ambulates with a FWW.     PATIENT GOALS improve strength in LE's, walk without walker     OBJECTIVE:    DIAGNOSTIC FINDINGS: N/A   TODAY'S TREATMENT:  FOTO 48 which has slightly worsened from prior 76 with a goal of 55 at visit #16.  GAIT: Assistive device utilized: Environmental consultant - 2 wheeled Comments: Pt ambulates with a FWW with bilat knees flexed and fwd flexed/stooped over posture with increased Wb'ing thru UE's.     Standing posture:  Rightward lean with R knee flexed   LE strength:  Hip flexion: R:  4+/5, L:  4-/5 Hip abduction  R: weak though has improved from 7.8 to 11.2 , L:  weak, improved from 5.6 to 9.7 Hip ER:  R:  4/5, L:  tol mild resistance Pt is very limited with L hip ER and is limited with R hip ER Knee extension:  5/5 w/n available ROM, 15 Lacks 21 deg of ext; ; L:  4+/5 w/n available ROM, 19.2.  Lacks 28 deg of ext Knee flexion:  R:  4-/5,  L:  4+/5  Therapeutic Exercise: Reviewed response to prior Rx, pain level, and current function.  Assessed LE strength. Pt performed:            Nustep at L4 x 6 mins with UE/LE  LAQ with YTB 2x10 reps bilat Seated marching with GTB 2 x 10 reps bilat             Seated HS curls with GTB 2x10 on R and with RTB 2 x 10 on L Seated Hip abd with RTB 2x10 on R and L  PT spent time educating pt and girlfriend concerning HEP and POC.                               PATIENT EDUCATION:  Education details:  HEP including appropriate frequency and resistance.  Pt denies wanting a handout because he already knows his HEP.  Instructed pt he needs to use his walker with standing and walking.  POC.   Person educated: Patient  Education method: Explanation, demonstration, verbal and visual cues Education comprehension: verbalized understanding, returned demonstration, verbal and visual cues required, needs further instruction     HOME EXERCISE PROGRAM: Access Code: [email or web address] URL: [email or web address]     ASSESSMENT:   CLINICAL IMPRESSION: Pt has improved with functional mobility as evidenced by subjective reports of improved walking, standing, and mobility in his home.  He reports improved ability to stand to cook.  He continues to have the same gait and postural deficits though reports improved mobility.  Pt continues to require walker for assistance with gait.  He has multiple co-morbidities including parkinson's which affects his gait, mobility, and balance.  PT spent time educating pt and girlfriend in correct exercises, appropriate resistance and correct form.  Pt has been compliant with HEP and demonstrates good understanding of HEP.  Pt able to perform HEP well.  Pt demonstrates improved hip abd strength bilat and L knee flex strength and worse strength in R knee flexion strength.  Pt responded well to Rx having no pain and no c/o's after Rx.  Pt has met STG's #1,2,4 and LTG's #  3, 4.  Pt has reached maximum functional potential  at this time.                  OBJECTIVE IMPAIRMENTS Abnormal gait, decreased activity tolerance, decreased balance, decreased endurance, decreased mobility, difficulty walking, decreased ROM, decreased strength, hypomobility, impaired flexibility, and pain.    ACTIVITY LIMITATIONS cleaning, community activity, meal prep, shopping, and ambulating and standing activities .    PERSONAL FACTORS Age and 3+ comorbidities: Parkinson's ;  Hx of bilat knee pain and OA ; neuropathy ; left hip intertrochanteric Fx with femur IM nailing 05/20/2020 ; A-fib, chronic heart failure, venous insufficiency, and DOE.  are also affecting patient's functional outcome.      REHAB POTENTIAL: Good   CLINICAL DECISION MAKING: Evolving/moderate complexity   EVALUATION COMPLEXITY: Moderate     GOALS:   SHORT TERM GOALS:   Pt will be independent and compliant with HEP for improved pain, strength, mobility, and function.   Baseline:  Target date: 05/26/2021 Goal status: GOAL MET    2.  Pt or wife will report at least a 25% improvement in mobility.  Baseline:  Target date: 05/26/2021 Goal status: GOAL MET   3.  Pt will demo improved posture with gait including standing up straighter and improved knee extension bilat for improved quality of gait Baseline:  Target date: 06/02/2021 Goal status: NOT MET   4.  Pt will report at least a 25% reduction in hip pain with standing activities and mobility. Baseline:  Target date: 06/02/2021 Goal status: GOAL MET       LONG TERM GOALS:   Pt will demo improved bilat hip strength to 4+/5 MMT, L knee ext to 5/5, bilat hip abd to be [facility] in sitting, and L hip ER to 4/5 MMT for improved performance of functional mobility Baseline:  Target date: 08/03/2021 Goal status: NOT MET   2.  Pt will perform 5x STS test with light UE support in no > 16 seconds for improved fxnl LE strength and performance of transfers. Baseline:   Target date: 08/03/2021 Goal status:  NOT ASSESSED   3.  Pt will ambulate community distance safely with FWW without significant hip pain. Baseline:  Target date: 08/03/2021 Goal status:  GOAL MET   4.  Pt will have improved tolerance with standing to perform household chores including being able to prepare food without significant pain or difficulty.  Baseline:  Target date: 08/03/2021 Goal status:  GOAL MET       PLAN:   PLANNED INTERVENTIONS: Therapeutic exercises, Therapeutic activity, Neuromuscular re-education, Balance training, Gait training, Patient/Family education, Joint mobilization, Stair training, Cryotherapy, Moist heat, Taping, and Manual therapy   PLAN FOR NEXT SESSION: Pt to be discharged from skilled PT services due to reaching maximum functional potential at this time.  Pt has made good progress toward goals.  Pt is agreeable with discharge.  He is independent with HEP and will cont with HEP.    PHYSICAL THERAPY DISCHARGE SUMMARY  Visits from Start of Care: 19  Current functional level related to goals / functional outcomes: See above   Remaining deficits: See above   Education / Equipment: See above      Selinda Michaels III PT, DPT 08/04/21 6:37 PM

## 2021-08-18 ENCOUNTER — Telehealth: Payer: Self-pay | Admitting: Cardiology

## 2021-08-18 ENCOUNTER — Other Ambulatory Visit: Payer: Self-pay

## 2021-08-18 MED ORDER — NITROGLYCERIN 0.4 MG SL SUBL: SUBLINGUAL_TABLET | SUBLINGUAL | 0 refills | Status: DC

## 2021-08-18 NOTE — Telephone Encounter (Signed)
Patient's significant other [name] is requesting refill for nitroglycerin. Says current one expired in 2021. [place] listed is okay.

## 2021-08-18 NOTE — Telephone Encounter (Signed)
Refill has been sent.  °

## 2021-08-25 DIAGNOSIS — M2041 Other hammer toe(s) (acquired), right foot: Secondary | ICD-10-CM | POA: Diagnosis not present

## 2021-08-25 DIAGNOSIS — M79671 Pain in right foot: Secondary | ICD-10-CM | POA: Diagnosis not present

## 2021-08-25 DIAGNOSIS — M79672 Pain in left foot: Secondary | ICD-10-CM | POA: Diagnosis not present

## 2021-08-30 ENCOUNTER — Other Ambulatory Visit: Payer: Self-pay | Admitting: Cardiology

## 2021-08-30 DIAGNOSIS — I10 Essential (primary) hypertension: Secondary | ICD-10-CM

## 2021-09-01 ENCOUNTER — Ambulatory Visit: Payer: Medicare Other | Admitting: Podiatry

## 2021-09-08 DIAGNOSIS — Z85828 Personal history of other malignant neoplasm of skin: Secondary | ICD-10-CM | POA: Diagnosis not present

## 2021-09-08 DIAGNOSIS — L820 Inflamed seborrheic keratosis: Secondary | ICD-10-CM | POA: Diagnosis not present

## 2021-09-08 DIAGNOSIS — L821 Other seborrheic keratosis: Secondary | ICD-10-CM | POA: Diagnosis not present

## 2021-09-08 DIAGNOSIS — L570 Actinic keratosis: Secondary | ICD-10-CM | POA: Diagnosis not present

## 2021-09-08 DIAGNOSIS — Z85820 Personal history of malignant melanoma of skin: Secondary | ICD-10-CM | POA: Diagnosis not present

## 2021-09-08 DIAGNOSIS — L218 Other seborrheic dermatitis: Secondary | ICD-10-CM | POA: Diagnosis not present

## 2021-09-16 DIAGNOSIS — N183 Chronic kidney disease, stage 3 unspecified: Secondary | ICD-10-CM | POA: Diagnosis not present

## 2021-09-21 DIAGNOSIS — I129 Hypertensive chronic kidney disease with stage 1 through stage 4 chronic kidney disease, or unspecified chronic kidney disease: Secondary | ICD-10-CM | POA: Diagnosis not present

## 2021-09-21 DIAGNOSIS — I50810 Right heart failure, unspecified: Secondary | ICD-10-CM | POA: Diagnosis not present

## 2021-09-21 DIAGNOSIS — E876 Hypokalemia: Secondary | ICD-10-CM | POA: Diagnosis not present

## 2021-09-21 DIAGNOSIS — N183 Chronic kidney disease, stage 3 unspecified: Secondary | ICD-10-CM | POA: Diagnosis not present

## 2021-09-29 DIAGNOSIS — I50810 Right heart failure, unspecified: Secondary | ICD-10-CM | POA: Diagnosis not present

## 2021-09-29 DIAGNOSIS — E876 Hypokalemia: Secondary | ICD-10-CM | POA: Diagnosis not present

## 2021-09-29 DIAGNOSIS — I129 Hypertensive chronic kidney disease with stage 1 through stage 4 chronic kidney disease, or unspecified chronic kidney disease: Secondary | ICD-10-CM | POA: Diagnosis not present

## 2021-09-29 DIAGNOSIS — N183 Chronic kidney disease, stage 3 unspecified: Secondary | ICD-10-CM | POA: Diagnosis not present

## 2021-10-06 DIAGNOSIS — H04123 Dry eye syndrome of bilateral lacrimal glands: Secondary | ICD-10-CM | POA: Diagnosis not present

## 2021-10-08 DIAGNOSIS — N183 Chronic kidney disease, stage 3 unspecified: Secondary | ICD-10-CM | POA: Diagnosis not present

## 2021-10-13 ENCOUNTER — Other Ambulatory Visit: Payer: Self-pay | Admitting: Cardiology

## 2021-10-13 DIAGNOSIS — I5032 Chronic diastolic (congestive) heart failure: Secondary | ICD-10-CM

## 2021-10-22 DIAGNOSIS — N183 Chronic kidney disease, stage 3 unspecified: Secondary | ICD-10-CM | POA: Diagnosis not present

## 2021-10-25 ENCOUNTER — Ambulatory Visit: Payer: Medicare Other | Admitting: Cardiology

## 2021-10-26 ENCOUNTER — Encounter: Payer: Self-pay | Admitting: Cardiology

## 2021-10-26 ENCOUNTER — Ambulatory Visit: Payer: Medicare Other | Admitting: Cardiology

## 2021-10-26 VITALS — BP 134/61 | HR 74 | Temp 98.0°F | Resp 16 | Ht 71.0 in | Wt 197.0 lb

## 2021-10-26 DIAGNOSIS — I4821 Permanent atrial fibrillation: Secondary | ICD-10-CM

## 2021-10-26 DIAGNOSIS — I272 Pulmonary hypertension, unspecified: Secondary | ICD-10-CM | POA: Diagnosis not present

## 2021-10-26 DIAGNOSIS — I5032 Chronic diastolic (congestive) heart failure: Secondary | ICD-10-CM

## 2021-10-26 NOTE — Progress Notes (Signed)
c    Patient is here for follow up visit. Subjective:   '@Patient'  ID: [name], male    DOB: 03-29-1935, 86 y.o.   MRN: [id number]   Chief Complaint  Patient presents with   Atrial Fibrillation   Follow-up    39 month    HPI  86 year old Caucasian male with coronary artery disease status (LAD PCI 2018), permanent atrial fibrillation, complex valvular heart disease, mod AI, mod TR, moderate pulmonary hypertension, venous insufficiency, Parkinsons disease, CKD.  Patient has had recent worsening of bilateral leg edema.  He denies any overt exertional dyspnea.  He also had worsening gait which she attributes to Parkinson's disease.  He recently saw his nephrologist Dr. Joelyn Oms, who increased his Lasix to 80 mg in the morning, and 40 mg in the afternoon.  Most recent creatinine is 1.85, GFR 36. (09/2021).    Current Outpatient Medications:    acetaminophen (TYLENOL) 325 MG tablet, Take 1-2 tablets (325-650 mg total) by mouth every 6 (six) hours as needed for mild pain (pain score 1-3 or temp > 100.5)., Disp: 100 tablet, Rfl: 0   apixaban (ELIQUIS) 2.5 MG TABS tablet, Take 1 tablet (2.5 mg total) by mouth 2 (two) times daily. (0800 & 2000), Disp: 180 tablet, Rfl: 5   carbidopa-levodopa (SINEMET IR) 25-100 MG tablet, Take 2 tablets by mouth 3 (three) times daily. 7am/11am/4pm, Disp: 540 tablet, Rfl: 2   Cholecalciferol (VITAMIN D-3) 1000 units CAPS, Take 1,000 Units by mouth daily. (0800), Disp: , Rfl:    dapagliflozin propanediol (FARXIGA) 10 MG TABS tablet, Take 1 tablet by mouth daily., Disp: , Rfl:    diltiazem (CARDIZEM CD) 120 MG 24 hr capsule, TAKE 1 CAPSULE EVERY DAY, Disp: 90 capsule, Rfl: 0   furosemide (LASIX) 40 MG tablet, Take 1 tablet (40 mg total) by mouth daily. (Patient not taking: Reported on 05/06/2021), Disp: 90 tablet, Rfl: 0   furosemide (LASIX) 80 MG tablet, TAKE 2 TABLETS EVERY MORNING AND TAKE 1 TABLET EVERY AFTERNOON AS DIRECTED, Disp: 270 tablet, Rfl: 3    gabapentin (NEURONTIN) 300 MG capsule, Take 1 capsule (300 mg total) by mouth 3 (three) times daily., Disp: 270 capsule, Rfl: 1   metolazone (ZAROXOLYN) 2.5 MG tablet, TAKE 1 TABLET (2.5 MG TOTAL) BY MOUTH AS DIRECTED TWICE A WEEK, Disp: 26 tablet, Rfl: 2   Multiple Vitamins-Minerals (PRESERVISION AREDS PO), Take 1 tablet by mouth daily., Disp: , Rfl:    nitroGLYCERIN (NITROSTAT) 0.4 MG SL tablet, TAKE 1 TABLET EVERY 5 MINUTES AS NEEDED FOR CHEST PAIN, Disp: 25 tablet, Rfl: 0   polyethylene glycol (MIRALAX / GLYCOLAX) packet, Take 17 g by mouth 2 (two) times daily. (Patient not taking: Reported on 05/07/2021), Disp: 14 each, Rfl: 0   potassium chloride (KLOR-CON) 10 MEQ tablet, Take 2 tablets (20 mEq total) by mouth 2 (two) times daily., Disp: 60 tablet, Rfl: 3   rosuvastatin (CRESTOR) 10 MG tablet, Take 10 mg by mouth daily at 8 pm. (2000), Disp: , Rfl:   Cardiovascular studies:  EKG 10/26/2021: Atrial fibrillation 73 bpm  Right bundle branch block  Left anterior fascicular block  RHC 03/20/2018: WHO Grp II pulmonary hypertension Mean PA 42 mmHg, PVR 2.4 WU Normal RV systolic function (PAPi 2.4) Recommendation: Continue medical management with aggressive diuresis.  Echocardiogram 01/02/2018: Left ventricle cavity is normal in size. Moderate concentric hypertrophy of the left ventricle. Low normal global wall motion. Visual EF is 50-55%. Unable to evaluate diastolic function due to A. Fibrillation.  Calculated EF 51%. Left atrial cavity is severely dilated. Right atrial cavity is moderately dilated. Trileaflet aortic valve with moderate aortic valve leaflet calcification. Trace aortic stenosis. Moderate (Grade II) aortic regurgitation. Moderate (Grade II) mitral regurgitation. Moderate tricuspid regurgitation. Mild pulmonary hypertension. Estimated pulmonary artery systolic pressure 65 mmHg. Compared to previous echocardiogram on 09/01/2016, there is interval increase in severity of aortic  regurgitation, tricuspid regurgitation, and pulmonary hypertension.  Event monitor 04/27/2017 - 05/26/2017: Persistent atrial fibrillation with controlled ventricular rate.   Two episodes of 3 beat nonsustained VT 4.6 second pause at 11:34 PM on 05/03/2017.   3.9 second pause at 7 AM on 05/14/2017.  Symptoms not reported. Occasional episodes of atypical atrial flutter with variable conduction  Lower extremity venous duplex 04/07/2017: No evidence of deep vein thrombosis of the right lower extremity with normal venous return. No evidence of valvular incompetence (chronic venous insufficiency) of the right lower extremity.  No evidence of deep vein thrombosis of the left lower extremity with normal venous return. Chronic valvular incompetence (chronic venous insufficiency) of the left lower extremity. Marked reflux noted in the left sapheno-femoral junction (2.5 s), great saphenous proximal thigh (2.6 s), great saphenous mid thigh (1.6 s), great saphenous distal thigh (3.9 s) and great saphenous proximal calf (3.4 s) veins.  Cardioversion 01/26/2017: S/p successful TEE guided cardioversion 100 JS/p successful TEE guided cardioversion 100 J  Coronary angiogram 08/09/2016: FFR guided CSI atherectomy followed by 3.0 x 26 and 3.0 x 18 mm onyx resolute DES to Mid LAD. Mild disease in Cx and RCA.  Lower Extremity Dopplers 06/2009: Pt has Baker Cyst behind both knees.   Recent labs: 09/29/2021: Glucose 98, BUN/Cr 35/1.85. EGFR 36. Na/K 135/4.2.   04/21/2021: Glucose 91, BUN/Cr 41/1.76. EGFR 38. Na/K 135/3.4.   10/12/2020: Glucose 91, BUN/Cr 31/1.55. EGFR 44. Na/K 139/3.7.   07/14/2020: Glucose 81, BUN/Cr 27/1.59. EGFR 43. Na/K 142/3.8  10/04/2018: Glucose 91, BUN/Cr 21/1.49. EGFR 43.  HbA1C 5.4% Chol 143, TG 63, HDL 65, LDL 65 TSH 2.8 normal    Review of Systems  Eyes:  Negative for visual disturbance.  Cardiovascular:  Positive for leg swelling (Significantly improved). Negative for chest  pain, dyspnea on exertion (Mild, stable), palpitations and syncope.  Respiratory:  Negative for shortness of breath.   Musculoskeletal:  Positive for joint pain (Bilateral knees).  All other systems reviewed and are negative.      Objective:    Vitals:   10/26/21 1033  BP: 134/61  Pulse: 74  Resp: 16  Temp: 98 F (36.7 C)  SpO2: 99%   Filed Weights   10/26/21 1033  Weight: 197 lb (89.4 kg)      Physical Exam Vitals and nursing note reviewed.  Constitutional:      General: He is not in acute distress. Neck:     Vascular: No JVD.  Cardiovascular:     Rate and Rhythm: Normal rate. Rhythm irregularly irregular.     Pulses: Normal pulses and intact distal pulses.     Heart sounds: Murmur heard.     Midsystolic murmur is present with a grade of 3/6. Aortic Area     No gallop. No S3 or S4 sounds.     Comments: S1 is variable, S2 is normal. Pulmonary:     Effort: Pulmonary effort is normal.     Breath sounds: Normal breath sounds. No wheezing or rales.  Musculoskeletal:        General: Normal range of motion.     Cervical  back: Neck supple.     Right lower leg: Edema (1+, baseline) present.     Left lower leg: Edema (1+, baseline) present.  Skin:    General: Skin is warm and dry.         Assessment & Recommendations:   86 year old Caucasian male with coronary artery disease status (LAD PCI 2018), now permanentl atrial fibrillation, complex valvular heart disease, mod AI, mod TR, moderate pulmonary hypertension, venous insufficiency, Parkinsons disease, CKD  Leg edema: Combination of HFpEF, PH and venous insufficiency, and possibly even lymphedema. He is not interested in vascular surgery referral, or endovascular therapies. Continue current dose of Lasix and metolazone. Consider switching to Bumex.  He will discuss further with Dr. Joelyn Oms next week. Given significant worsening leg edema, will obtain echocardiogram.  Permanent atrial fibrilation: Rate  controlled. CHA2DS2VASc score 3: Annual stroke risk 3.2% Continue Eliquis 2.5 mg bid.  Continue diltiazem 120 mg for rate control.   Valvular disease: Mod aortic, motral, tricuspid regurg, likely all due to atrial fibrilation. Rate control for Afib, as above.  Pulmonary hypertension: WHO Grp II pulmonary hypertension due to left sided heart diseease (HFpEF, valvular heart disease, Afib- RHC 02/2018 showed PCWP 27 mmHg). Recommend continued management of hypertension and diuresis.   H/o GI bleed: S/p hemorrhoidectomy in 08/2017.Currently, no bleeding with stable Hb. Tolerating Eliquis well.  CAD: Stable. Not on aspirin due to bleeding risk. Continue statin. Given his advanced age, renal dysfunction, recommend conservative medical management  F/u in 6 weeks  [name] Evett Esther Hardy, MD [facility] [place] Pager: [phone] Office: [phone] If no answer Cell [phone]

## 2021-11-02 DIAGNOSIS — I50810 Right heart failure, unspecified: Secondary | ICD-10-CM | POA: Diagnosis not present

## 2021-11-02 DIAGNOSIS — E876 Hypokalemia: Secondary | ICD-10-CM | POA: Diagnosis not present

## 2021-11-02 DIAGNOSIS — N183 Chronic kidney disease, stage 3 unspecified: Secondary | ICD-10-CM | POA: Diagnosis not present

## 2021-11-02 DIAGNOSIS — I129 Hypertensive chronic kidney disease with stage 1 through stage 4 chronic kidney disease, or unspecified chronic kidney disease: Secondary | ICD-10-CM | POA: Diagnosis not present

## 2021-11-12 ENCOUNTER — Ambulatory Visit: Payer: Medicare Other | Admitting: Neurology

## 2021-11-16 DIAGNOSIS — N183 Chronic kidney disease, stage 3 unspecified: Secondary | ICD-10-CM | POA: Diagnosis not present

## 2021-11-16 DIAGNOSIS — I10 Essential (primary) hypertension: Secondary | ICD-10-CM | POA: Diagnosis not present

## 2021-11-24 IMAGING — RF DG HIP (WITH PELVIS) OPERATIVE*L*
1 series · 3 of 3 positions shown · non-contrast
Comparison: May 18, 2020

CLINICAL DATA: Intramedullary nailing for fracture

EXAM:
OPERATIVE LEFT HIP  2 VIEWS
TECHNIQUE: Fluoroscopic spot image(s) were submitted for interpretation
post-operatively.

[Series 1: run · 3 of 3 slices shown]
[im 1/3]
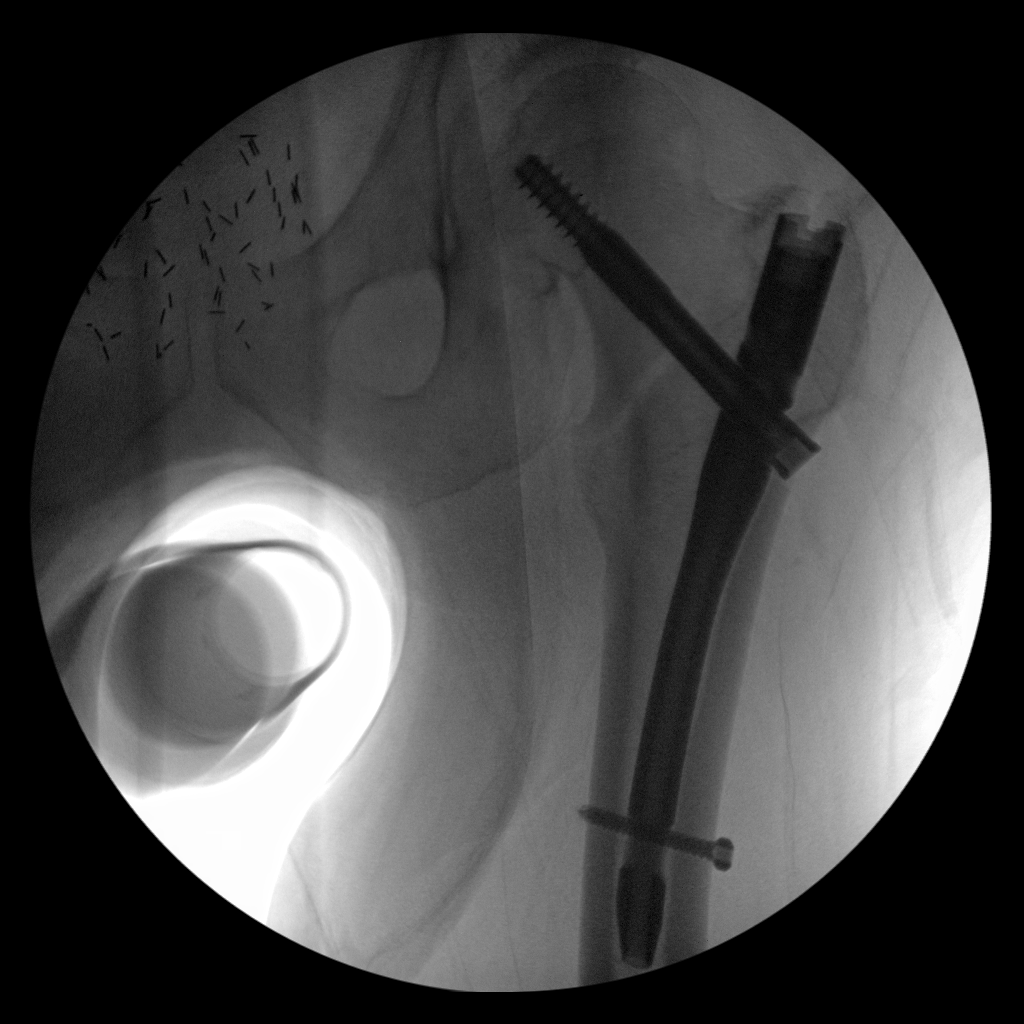
[im 2/3]
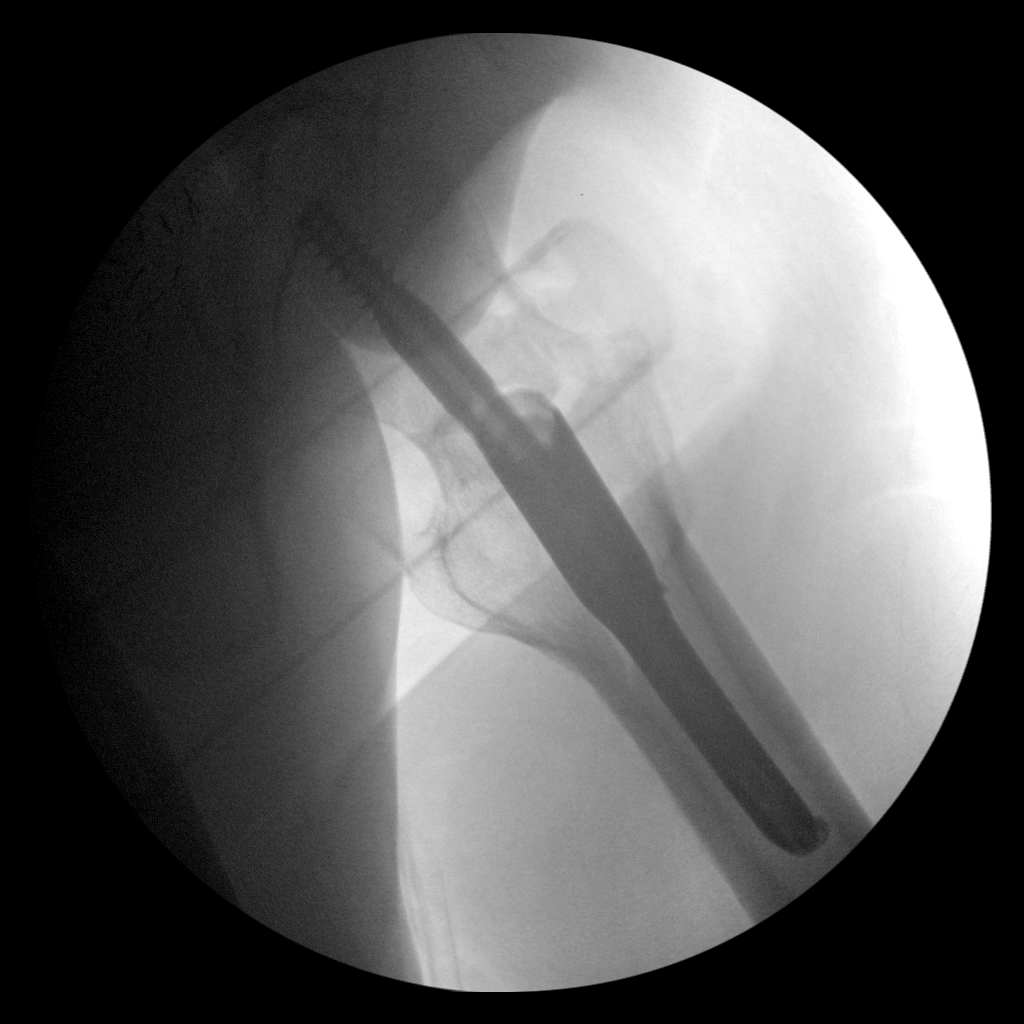
[im 3/3]
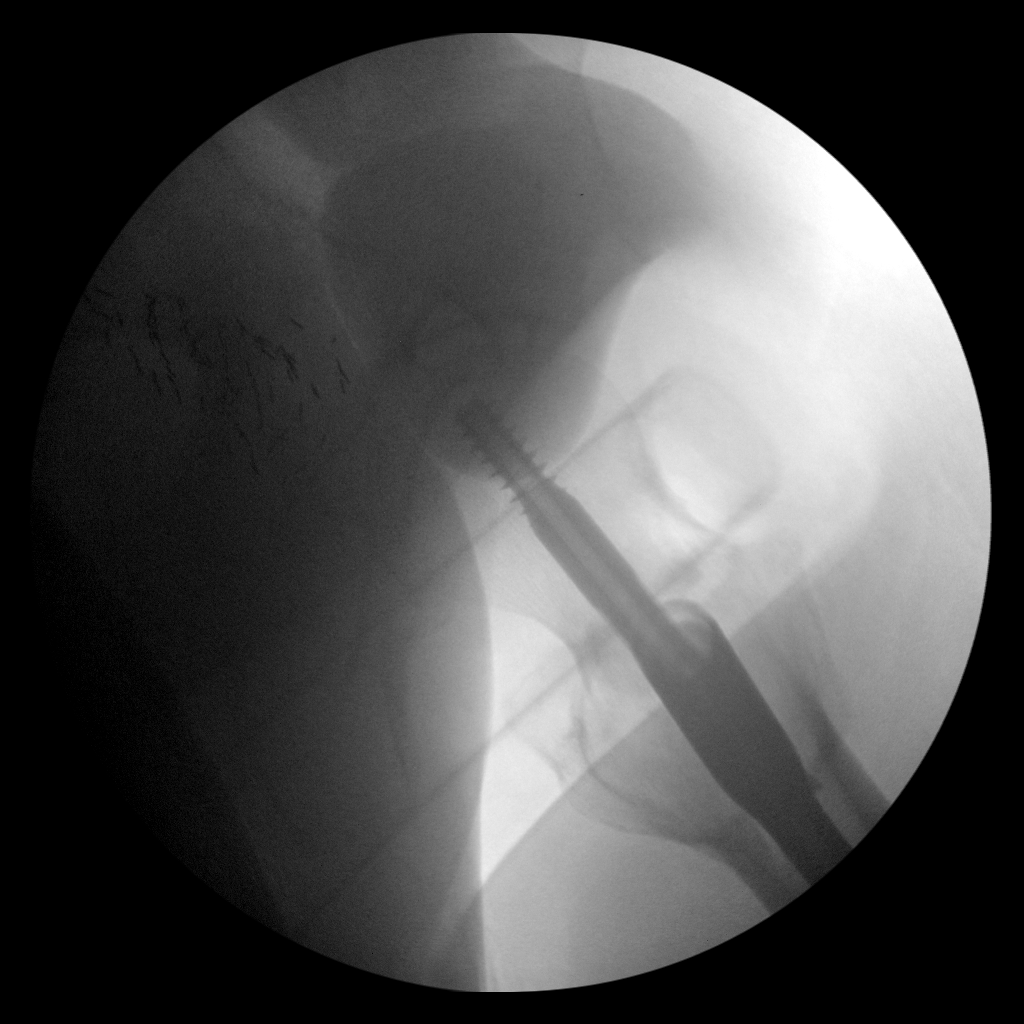

[3 of 3 positions shown; findings below may reference images not displayed]

FINDINGS: Frontal and lateral views were obtained. There is screw and nail
fixation through intertrochanteric femur fracture on the left with
alignment at the fracture site near anatomic. Screw tip in proximal
femoral head. No new fracture. No dislocation. There are seed
implants in the prostate.
IMPRESSION: Screw and nail fixation through intertrochanteric femur fracture on
the left with alignment fracture site near anatomic. No new
fracture. No dislocation. Screw tip in proximal femoral head.

## 2021-11-25 DIAGNOSIS — Z23 Encounter for immunization: Secondary | ICD-10-CM | POA: Diagnosis not present

## 2021-11-26 ENCOUNTER — Other Ambulatory Visit: Payer: Medicare Other

## 2021-12-03 NOTE — Progress Notes (Signed)
Assessment/Plan:   1.  Parkinsons Disease  -Continue carbidopa/levodopa 25/100, 2 tablets 3 times per day.  Can take extra 1/2 to 1 prn.  -Follows with Dr. Ronnald Ramp for dermatology.  We discussed that it used to be thought that levodopa would increase risk of melanoma but now it is believed that Parkinsons itself likely increases risk of melanoma.     -biggest issue right now is leg edema and has to keep legs up, preventing exercise.    2.  Renal insufficiency  -Follows with nephrology  3.  Atrial fibrillation  -On Eliquis  -Follows with cardiology  4.  Feet pain paresthesias   -Continue gabapentin, 300 mg 3 times daily.  5.  Head dyskinesia  -none seen today as was seen at "off" time  -not bothersome to the patient (wife notices more than he does)  -hold on amantadine as not bothersome to the patient and risks of med>benefits  6.  Jaw closing dystonia  -occasionally happens but rare.  No botox required     Subjective:   [name] was seen today in follow up for Parkinsons disease.  My previous records were reviewed prior to todays visit as well as outside records available to me.  Has been to physical therapy since our last visit.  Notes are reviewed.  He is still doing those PT exercises at home.  He saw cardiology on August 29.  Those notes are reviewed.  He has had no falls.  No lightheadedness or near syncope.  In regards to jaw closing dystonia, he states that it rarely happens.  His legs have been very swollen and they added bumex to the zaroxoyln.  Using walker.    Current prescribed movement disorder medications: Carbidopa/levodopa 25/100, 2 tablets at 7 AM/11 AM/4 PM Gabapentin, 300 mg 3 times per day   PREVIOUS MEDICATIONS: Gabapentin for peripheral neuropathy  ALLERGIES:   Allergies  Allergen Reactions   Nsaids Other (See Comments)    Cannot have any of these because he is currently taking Eliquis    CURRENT MEDICATIONS:  Outpatient Encounter  Medications as of 12/07/2021  Medication Sig   acetaminophen (TYLENOL) 325 MG tablet Take 1-2 tablets (325-650 mg total) by mouth every 6 (six) hours as needed for mild pain (pain score 1-3 or temp > 100.5).   apixaban (ELIQUIS) 2.5 MG TABS tablet Take 1 tablet (2.5 mg total) by mouth 2 (two) times daily. (0800 & 2000) (Patient taking differently: Take 5 mg by mouth 2 (two) times daily. (0800 & 2000))   bumetanide (BUMEX) 2 MG tablet Take 2 mg by mouth 2 (two) times daily.   carbidopa-levodopa (SINEMET IR) 25-100 MG tablet Take 2 tablets by mouth 3 (three) times daily. 7am/11am/4pm   Cholecalciferol (VITAMIN D-3) 1000 units CAPS Take 1,000 Units by mouth daily. (0800)   dapagliflozin propanediol (FARXIGA) 10 MG TABS tablet Take 1 tablet by mouth daily.   diltiazem (CARDIZEM CD) 120 MG 24 hr capsule TAKE 1 CAPSULE EVERY DAY   gabapentin (NEURONTIN) 300 MG capsule Take 1 capsule (300 mg total) by mouth 3 (three) times daily.   metolazone (ZAROXOLYN) 2.5 MG tablet TAKE 1 TABLET (2.5 MG TOTAL) BY MOUTH AS DIRECTED TWICE A WEEK   Multiple Vitamins-Minerals (PRESERVISION AREDS PO) Take 1 tablet by mouth daily.   nitroGLYCERIN (NITROSTAT) 0.4 MG SL tablet TAKE 1 TABLET EVERY 5 MINUTES AS NEEDED FOR CHEST PAIN   polyethylene glycol (MIRALAX / GLYCOLAX) packet Take 17 g by mouth 2 (  two) times daily.   potassium chloride (KLOR-CON) 10 MEQ tablet Take 2 tablets (20 mEq total) by mouth 2 (two) times daily. (Patient taking differently: Take 40 mEq by mouth 3 (three) times daily.)   rosuvastatin (CRESTOR) 10 MG tablet Take 10 mg by mouth daily at 8 pm. (2000)   furosemide (LASIX) 40 MG tablet Take 1 tablet (40 mg total) by mouth daily. (Patient taking differently: Take 40 mg by mouth 2 (two) times daily.)   [DISCONTINUED] furosemide (LASIX) 80 MG tablet TAKE 2 TABLETS EVERY MORNING AND TAKE 1 TABLET EVERY AFTERNOON AS DIRECTED   No facility-administered encounter medications on file as of 12/07/2021.     Objective:   PHYSICAL EXAMINATION:    VITALS:   Vitals:   12/07/21 1050  BP: 119/62  Pulse: 81  SpO2: 100%  Weight: 189 lb (85.7 kg)  Height: '5\' 11"'$  (1.803 m)       GEN:  The patient appears stated age and is in NAD. HEENT:  Normocephalic, atraumatic.  The mucous membranes are moist. The superficial temporal arteries are without ropiness or tenderness. CV: Irregular. Lungs:  CTAB Neck/HEME:  There are no carotid bruits bilaterally. Exts:  Legs are swollen/edematous.    Neurological examination:  Orientation: The patient is alert and oriented x3. Cranial nerves: There is good facial symmetry with facial hypomimia. The speech is fluent and clear. Soft palate rises symmetrically and there is no tongue deviation. Hearing is intact to conversational tone. Sensation: Sensation is intact to light touch throughout Motor: Strength is at least antigravity x4.  He leans to the left  Movement examination: Tone: There is normal tone today. Abnormal movements: no head dyskinesia Coordination:  There is mild decremation with finger taps Gait and Station: The patient has no difficulty arising out of a deep-seated chair without the use of the hands. The patient's stride length is good with the U step walker.  He is flexed at the knees but ambulates well.  There is a festinating quality.  I have reviewed and interpreted the following labs independently    Chemistry      Component Value Date/Time   NA 135 04/21/2021 1015   K 3.4 (L) 04/21/2021 1015   CL 84 (L) 04/21/2021 1015   CO2 30 (H) 04/21/2021 1015   BUN 41 (H) 04/21/2021 1015   CREATININE 1.76 (H) 04/21/2021 1015      Component Value Date/Time   CALCIUM 9.8 04/21/2021 1015   ALKPHOS 103 05/24/2020 0859   AST 32 05/24/2020 0859   ALT 10 05/24/2020 0859   BILITOT 5.1 (H) 05/24/2020 0859       Lab Results  Component Value Date   WBC 6.3 05/24/2020   HGB 11.6 (L) 05/24/2020   HCT 36.9 (L) 05/24/2020   MCV 94.4  05/24/2020   PLT 172 05/24/2020    Lab Results  Component Value Date   TSH 2.94 09/15/2016     Total time spent on today's visit was 23 minutes, including both face-to-face time and nonface-to-face time.  Time included that spent on review of records (prior notes available to me/labs/imaging if pertinent), discussing treatment and goals, answering patient's questions and coordinating care.  Cc:  Carol Ada, MD

## 2021-12-07 ENCOUNTER — Encounter: Payer: Self-pay | Admitting: Neurology

## 2021-12-07 ENCOUNTER — Ambulatory Visit (INDEPENDENT_AMBULATORY_CARE_PROVIDER_SITE_OTHER): Payer: Medicare Other | Admitting: Neurology

## 2021-12-07 VITALS — BP 119/62 | HR 81 | Ht 71.0 in | Wt 189.0 lb

## 2021-12-07 DIAGNOSIS — G20B2 Parkinson's disease with dyskinesia, with fluctuations: Secondary | ICD-10-CM | POA: Diagnosis not present

## 2021-12-07 NOTE — Patient Instructions (Signed)
Local and Online Resources for Power over Parkinson's Group  October 2023    LOCAL [place] PARKINSON'S GROUPS   Power over Parkinson's Group:    Power Over Parkinson's Patient Education Group will be Wednesday, October 11th-*Hybrid meting*- in person at [facility] location and via [facility] at 2 pm.   Upcoming Power over [employer] Meetings:  2nd Wednesdays of the month at 2 pm:   October 11th, November 8th, December 13th  Contact Amy Marriott at [email or web address] if interested in participating in this group    [place]! Moves [employer] Instructor-Led Classes offering at [employer]!  TUESDAYS and Wednesdays 1-2 pm.   Contact Vonna Kotyk at  [email or web address] or Caron Presume at [place], [email or web address]  Dance for Parkinson 's classes will be on Tuesdays 9:30am-10:30am starting October 3-December 12 with a break the week of November 21st. Located in the [employer]  which is in the first floor of the [employer] ([place].) To register:  [email or web address] or [phone]  Drumming for Parkinson's will be held on 2nd and 4th Mondays at 11:00 am.   Located at the [place] ([place]  [place] at [email or web address] or [phone]  Through support from the [place] for Parkinson's classes are free for both patients and caregivers.    [employer] Parkinson's Tai Chi Class, Mondays at 11 am.  Call [phone] for details   [place]:  [email or web address]  PD Health at Home continues:  Mindfulness Mondays, Wellness Wednesdays, Fitness Fridays   Upcoming Education:    Parkinson's 101:  What you and your family should know.  Wednesday, Oct. 4th 1-2 pm  Expert Briefing:     Parkinson's and the Gut-Brain Connection.   Wednesday, Oct. 11th 1-2 pm  Hallucinations and Delusions in Parkinson's.  Wednesday, Nov. 8th, 1-2 pm  Register for expert briefings (webinars) at [email or web address]  Please check out their website to sign up for emails and see their full online offerings      [place]:  [email or web address]   Third Thursday Webinars:  On the third Thursday of every month at 12 p.m. ET, join our free live webinars to learn about various aspects of living with Parkinson's disease and our work to speed medical breakthroughs.  Upcoming Webinar:  Surveyor, mining for [employer]. (Replay).  Thursday, Oct. 12th at 12 noon  Check out additional information on their website to see their full online offerings    [place]:  [email or web address]  Upcoming Webinar:   Stay tuned  Webinar Series:  Living with Parkinson's Meetup.   Third Thursdays each month, 3 pm  Care Partner Monthly Meetup.  With Robin Searing Phinney.  First Tuesday of each month, 2 pm  Check out additional information to Live Well Today on their website    Parkinson and Movement Disorders (PMD) Alliance:  www.[email or web address]  [employer] Online:  Online Education Events  Sign up for emails, which are sent weekly to give you updates on programming and online offerings    Parkinson's Association of the [place]:  www.[email or web address]  Information on online support groups, education events, and online exercises including Yoga, Parkinson's exercises and more-LOTS of information on links to PD resources and online events  Virtual Support Group through Parkinson's Association of the [place]; next one is scheduled for Wednesday, October 4th at 2 pm.  (  These are typically scheduled for the 1st Wednesday of the month at 2 pm).  Visit website for details.   MOVEMENT AND EXERCISE OPPORTUNITIES  PWR! Moves Classes  at [place].  Wednesdays 10 and 11 am.   Contact Amy Marriott, PT [email or web address] if interested.  NEW PWR! Moves Class offerings at [employer].  *TUESDAYS* and Wednesdays 1-2 pm.  Contact Vonna Kotyk at  [email or web address] or Caron Presume at [place],  [email or web address]  Parkinson's Wellness Recovery (PWR! Moves)  [email or web address]  Info on the PWR! Virtual Experience:  You will have access to our expertise?through self-assessment, guided plans that start with the PD-specific fundamentals, educational content, tips, Q&A with an expert, and a growing Art therapist of PD-specific pre-recorded and live exercise classes of varying types and intensity - both physical and cognitive! If that is not enough, we offer 1:1 wellness consultations (in-person or virtual) to personalize your PWR! Research scientist (medical).   [place] Fridays:   As part of the PD Health @ Home program, this free video series focuses each week on one aspect of fitness designed to support people living with Parkinson's.? These weekly videos highlight the Bennett fitness guidelines for people with Parkinson's disease.  [email or web address]  Dance for PD website is offering free, live-stream classes throughout the week, as well as links to [employer] of classes:  [email or web address]  Virtual dance and Pilates for Parkinson's classes: Click on the Community Tab> Parkinson's Movement Initiative Tab.  To register for classes and for more information, visit [email or web address] and click the "community" tab.   [employer] Parkinson's Cycling Classes   [employer]:  Thursdays @ Noon-Live classes at [employer] ([employer] at [email or web address]?or [phone])  [employer] [facility]: Virtual Classes Mondays and Thursdays Jeanette Caprice classes Tuesday, Wednesday and Thursday (contact [place] at  [email or web address] ?or [phone])  [place]  Varied levels of classes are offered Tuesdays and Thursdays at [employer].   Stretching with Verdis Frederickson weekly class is also offered for people with Parkinson's  To observe a class or for more information, call [phone] or email Hezzie Bump at [email or web address]   ADDITIONAL SUPPORT AND RESOURCES  Well[employer]Spring [employer]:Online Caregiver Education Opportunities:  www.well-[email or web address]/caregiver-education/caregiver-support-group.  You may also contact Vickki Muff at [email or web address] or [phone].     Well-Spring Navigator:  Just1Navigator program, a?free service to help individuals and families through the journey of determining care for older adults.  The "Navigator" is a Education officer, museum, Arnell Asal, who will speak with a prospective client and/or loved ones to provide an assessment of the situation and a set of recommendations for a personalized care plan -- all free of charge, and whether?Well[employer]Spring [employer] offers the needed service or not. If the need is not a service we provide, we are well-connected with reputable programs in town that we can refer you to.  www.well-[email or web address] or to speak with the Navigator, call [phone].

## 2021-12-09 ENCOUNTER — Ambulatory Visit: Payer: Medicare Other | Admitting: Cardiology

## 2021-12-15 DIAGNOSIS — N183 Chronic kidney disease, stage 3 unspecified: Secondary | ICD-10-CM | POA: Diagnosis not present

## 2021-12-16 ENCOUNTER — Ambulatory Visit: Payer: Medicare Other

## 2021-12-16 DIAGNOSIS — I5032 Chronic diastolic (congestive) heart failure: Secondary | ICD-10-CM | POA: Diagnosis not present

## 2021-12-22 DIAGNOSIS — I872 Venous insufficiency (chronic) (peripheral): Secondary | ICD-10-CM | POA: Diagnosis not present

## 2021-12-22 DIAGNOSIS — I129 Hypertensive chronic kidney disease with stage 1 through stage 4 chronic kidney disease, or unspecified chronic kidney disease: Secondary | ICD-10-CM | POA: Diagnosis not present

## 2021-12-22 DIAGNOSIS — E876 Hypokalemia: Secondary | ICD-10-CM | POA: Diagnosis not present

## 2021-12-22 DIAGNOSIS — N183 Chronic kidney disease, stage 3 unspecified: Secondary | ICD-10-CM | POA: Diagnosis not present

## 2021-12-22 DIAGNOSIS — I50810 Right heart failure, unspecified: Secondary | ICD-10-CM | POA: Diagnosis not present

## 2021-12-24 ENCOUNTER — Ambulatory Visit: Payer: Medicare Other

## 2021-12-24 VITALS — BP 103/54 | HR 62 | Resp 16 | Ht 71.0 in | Wt 187.0 lb

## 2021-12-24 DIAGNOSIS — R600 Localized edema: Secondary | ICD-10-CM | POA: Diagnosis not present

## 2021-12-24 DIAGNOSIS — I272 Pulmonary hypertension, unspecified: Secondary | ICD-10-CM | POA: Diagnosis not present

## 2021-12-24 DIAGNOSIS — I4821 Permanent atrial fibrillation: Secondary | ICD-10-CM

## 2021-12-24 IMAGING — CR DG CHEST 2V
2 series · 2 of 2 positions shown · non-contrast
Comparison: May 19, 2020

CLINICAL DATA: Shortness of breath, chronic heart failure in a
84-year-old male.

EXAM:
CHEST - 2 VIEW

[w chest pa]
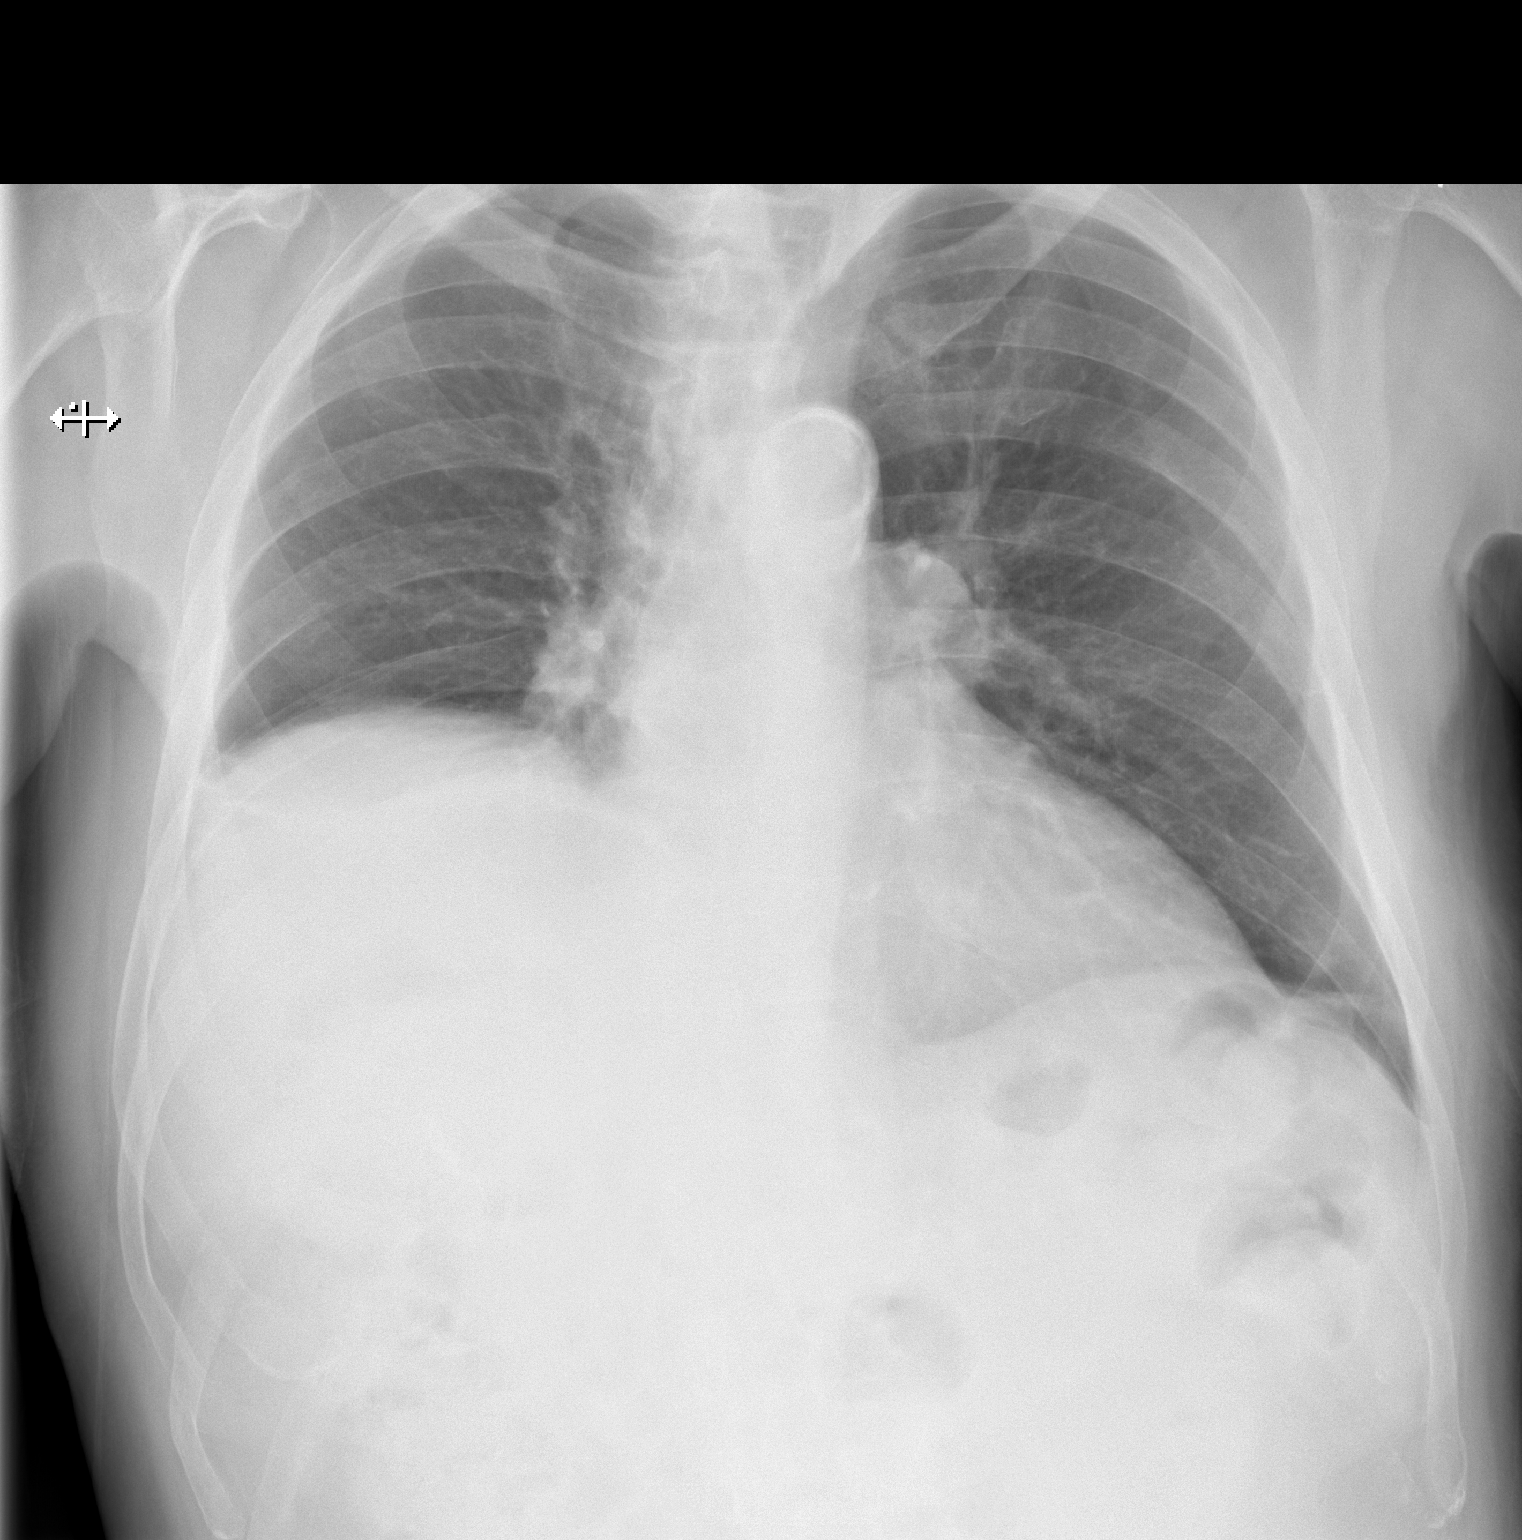

[w chest lat]
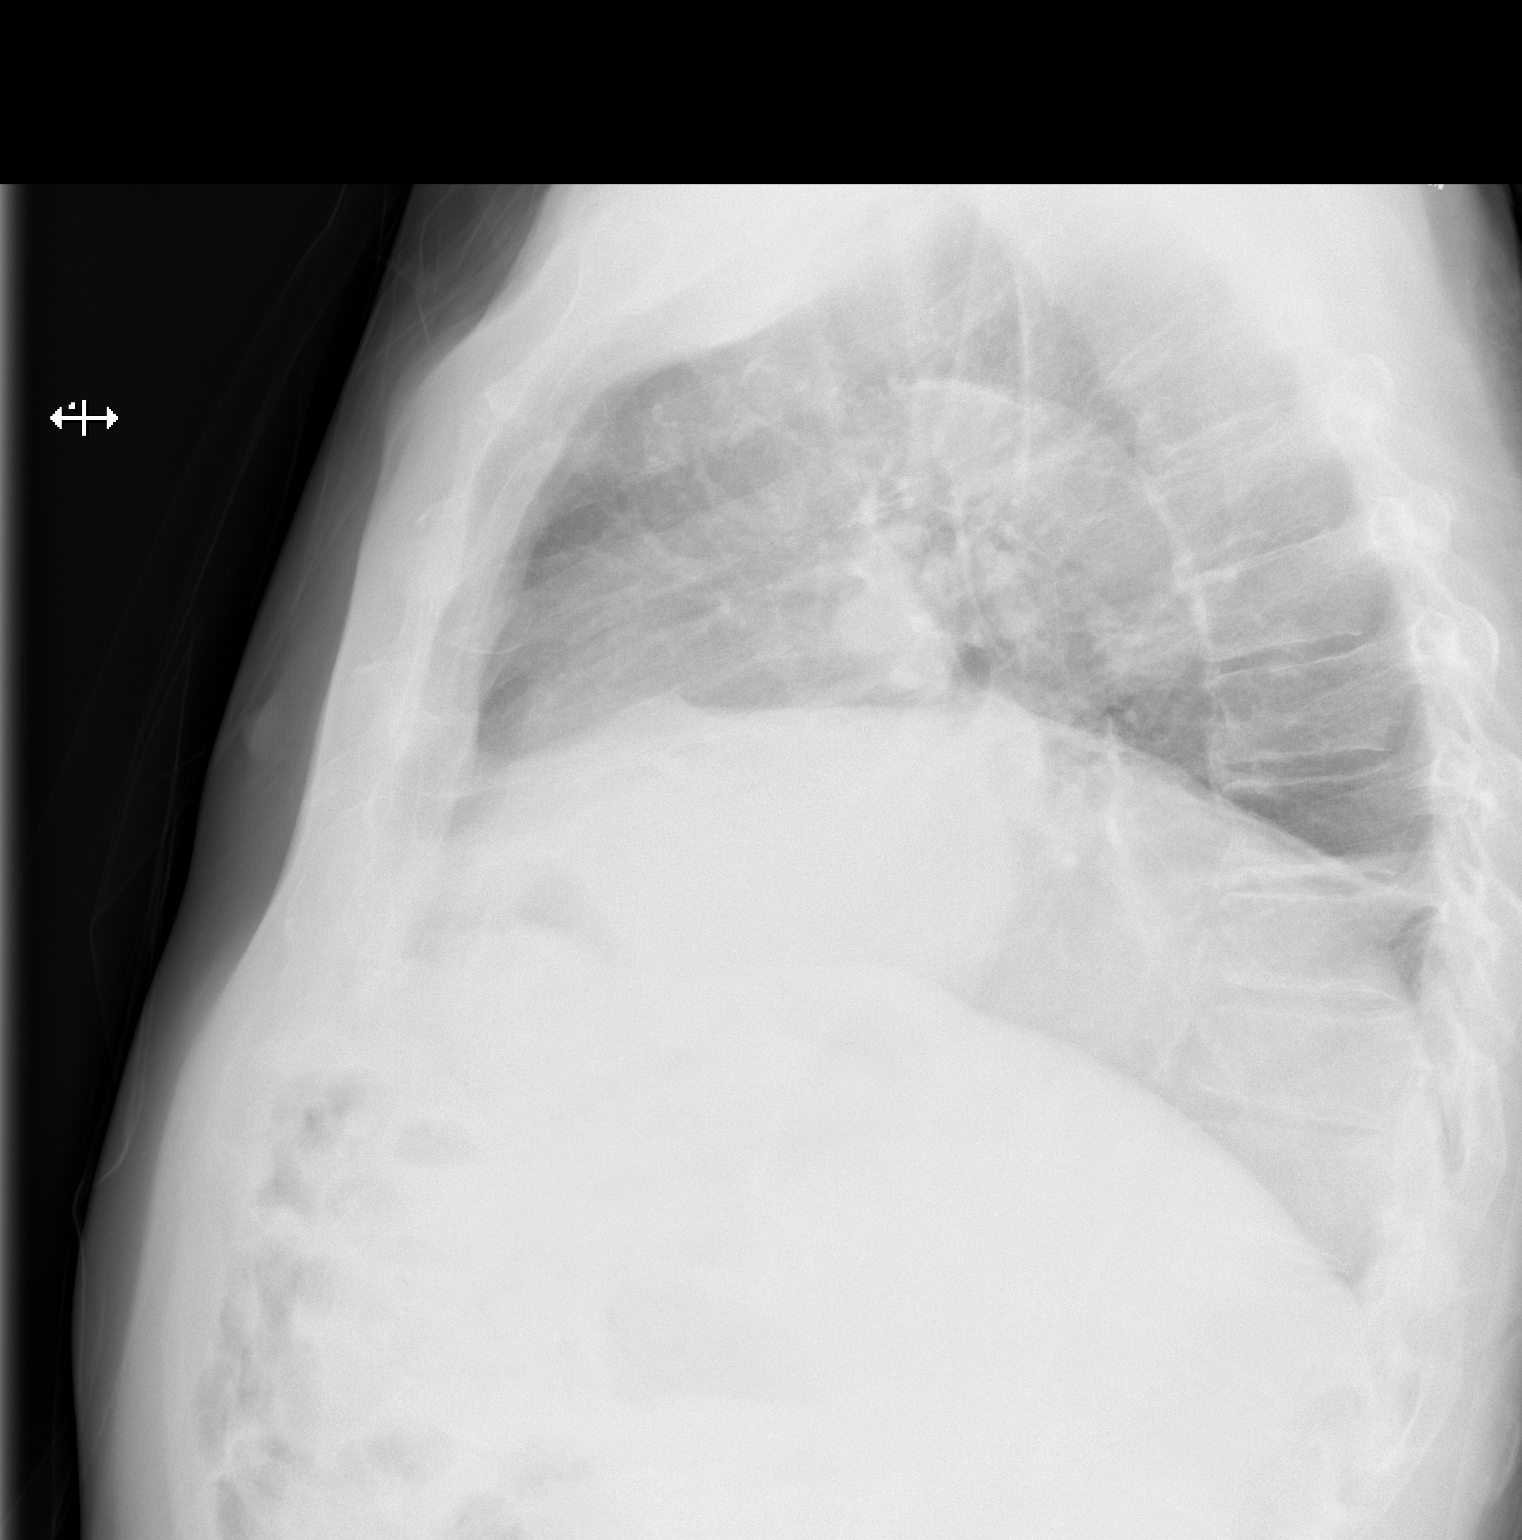

[2 of 2 positions shown; findings below may reference images not displayed]

FINDINGS: Cardiomediastinal contours unchanged with fullness of central
pulmonary vasculature. Lung volumes rema[REDACTED]reased but with slight
improved aeration compared to the prior study. Persistent elevation
of the RIGHT hemidiaphragm with linear airspace disease at both the
RIGHT and LEFT lung base. Lungs are otherwise clear.

No sign of pleural effusion.

On limited assessment no acute skeletal process.
IMPRESSION: 1. Improved aeration since the prior study.
2. Persistent bibasilar airspace disease, likely atelectasis.
3. Cardiomegaly and pulmonary vascular congestion.

## 2021-12-24 MED ORDER — FUROSEMIDE 40 MG PO TABS: 40.0000 mg | ORAL_TABLET | Freq: Two times a day (BID) | ORAL | 3 refills | Status: DC

## 2021-12-24 NOTE — Progress Notes (Signed)
c    Patient is here for follow up visit. Subjective:   _0  ID: [name], male    DOB: 13-Jul-1935, 86 y.o.   MRN: [id number]   Chief Complaint  Patient presents with   Congestive Heart Failure   Follow-up    6 week    Congestive Heart Failure Pertinent negatives include no chest pain, palpitations or shortness of breath.   86 year old Caucasian male with coronary artery disease status (LAD PCI 2018), permanent atrial fibrillation, complex valvular heart disease, mod AI, mod TR, moderate pulmonary hypertension, venous insufficiency, Parkinsons disease, CKD.  Patient presents today for follow-up.  Patient has had recent worsening of bilateral leg edema.  He denies any overt exertional dyspnea.  He also had worsening gait which she attributes to Parkinson's disease.  He recently saw his nephrologist Dr. Joelyn Oms, who changed Lasix to Bumex 25m twice daily. He will see Vein and Vascular specialist next week.    Current Outpatient Medications:    acetaminophen (TYLENOL) 325 MG tablet, Take 1-2 tablets (325-650 mg total) by mouth every 6 (six) hours as needed for mild pain (pain score 1-3 or temp > 100.5)., Disp: 100 tablet, Rfl: 0   apixaban (ELIQUIS) 2.5 MG TABS tablet, Take 1 tablet (2.5 mg total) by mouth 2 (two) times daily. (0800 & 2000) (Patient taking differently: Take 5 mg by mouth 2 (two) times daily. (0800 & 2000)), Disp: 180 tablet, Rfl: 5   carbidopa-levodopa (SINEMET IR) 25-100 MG tablet, Take 2 tablets by mouth 3 (three) times daily. 7am/11am/4pm, Disp: 540 tablet, Rfl: 2   Cholecalciferol (VITAMIN D-3) 1000 units CAPS, Take 1,000 Units by mouth daily. (0800), Disp: , Rfl:    dapagliflozin propanediol (FARXIGA) 10 MG TABS tablet, Take 1 tablet by mouth daily., Disp: , Rfl:    diltiazem (CARDIZEM CD) 120 MG 24 hr capsule, TAKE 1 CAPSULE EVERY DAY, Disp: 90 capsule, Rfl: 0   gabapentin (NEURONTIN) 300 MG capsule, Take 1 capsule (300 mg total) by mouth 3  (three) times daily., Disp: 270 capsule, Rfl: 1   metolazone (ZAROXOLYN) 2.5 MG tablet, TAKE 1 TABLET (2.5 MG TOTAL) BY MOUTH AS DIRECTED TWICE A WEEK, Disp: 26 tablet, Rfl: 2   Multiple Vitamins-Minerals (PRESERVISION AREDS PO), Take 1 tablet by mouth daily., Disp: , Rfl:    nitroGLYCERIN (NITROSTAT) 0.4 MG SL tablet, TAKE 1 TABLET EVERY 5 MINUTES AS NEEDED FOR CHEST PAIN, Disp: 25 tablet, Rfl: 0   polyethylene glycol (MIRALAX / GLYCOLAX) packet, Take 17 g by mouth 2 (two) times daily., Disp: 14 each, Rfl: 0   potassium chloride (KLOR-CON) 10 MEQ tablet, Take 2 tablets (20 mEq total) by mouth 2 (two) times daily. (Patient taking differently: Take 40 mEq by mouth 3 (three) times daily.), Disp: 60 tablet, Rfl: 3   rosuvastatin (CRESTOR) 10 MG tablet, Take 10 mg by mouth daily at 8 pm. (2000), Disp: , Rfl:    furosemide (LASIX) 40 MG tablet, Take 1 tablet (40 mg total) by mouth 2 (two) times daily., Disp: 60 tablet, Rfl: 3  Cardiovascular studies:  EKG 10/26/2021: Atrial fibrillation 73 bpm  Right bundle branch block  Left anterior fascicular block  RHC 03/20/2018: WHO Grp II pulmonary hypertension Mean PA 42 mmHg, PVR 2.4 WU Normal RV systolic function (PAPi 2.4) Recommendation: Continue medical management with aggressive diuresis.  Echocardiogram 12/16/2021: Left ventricle cavity is normal in size. Normal left ventricular wall thickness. Normal global wall motion. Normal LV systolic function with EF 61%. Unable to evaluate  diastolic function due to atrial fibrillation.  Left atrial cavity is moderately dilated at 41.8 ml/m^2. Right atrial cavity is moderately dilated. Trileaflet aortic valve.  Moderate aortic valve leaflet calcification. Moderate (Grade II) aortic regurgitation.  Moderate (Grade II) mitral regurgitation.  Mild to moderate tricuspid regurgitation. The IVC is not seen. Pulmonary hypertension seen in 2019 not appreciated on this study.  Event monitor 04/27/2017 -  05/26/2017: Persistent atrial fibrillation with controlled ventricular rate.   Two episodes of 3 beat nonsustained VT 4.6 second pause at 11:34 PM on 05/03/2017.   3.9 second pause at 7 AM on 05/14/2017.  Symptoms not reported. Occasional episodes of atypical atrial flutter with variable conduction  Lower extremity venous duplex 04/07/2017: No evidence of deep vein thrombosis of the right lower extremity with normal venous return. No evidence of valvular incompetence (chronic venous insufficiency) of the right lower extremity.  No evidence of deep vein thrombosis of the left lower extremity with normal venous return. Chronic valvular incompetence (chronic venous insufficiency) of the left lower extremity. Marked reflux noted in the left sapheno-femoral junction (2.5 s), great saphenous proximal thigh (2.6 s), great saphenous mid thigh (1.6 s), great saphenous distal thigh (3.9 s) and great saphenous proximal calf (3.4 s) veins.  Cardioversion 01/26/2017: S/p successful TEE guided cardioversion 100 JS/p successful TEE guided cardioversion 100 J  Coronary angiogram 08/09/2016: FFR guided CSI atherectomy followed by 3.0 x 26 and 3.0 x 18 mm onyx resolute DES to Mid LAD. Mild disease in Cx and RCA.  Lower Extremity Dopplers 06/2009: Pt has Baker Cyst behind both knees.   Recent labs: 12/22/2021: Sodium 141, potassium 4.3, glucose 82, BUN 42, creatinine 2.18, EGFR 29  09/29/2021: Glucose 98, BUN/Cr 35/1.85. EGFR 36. Na/K 135/4.2.   04/21/2021: Glucose 91, BUN/Cr 41/1.76. EGFR 38. Na/K 135/3.4.    Review of Systems  Eyes:  Negative for visual disturbance.  Cardiovascular:  Positive for leg swelling. Negative for chest pain, dyspnea on exertion (Mild, stable), palpitations and syncope.  Respiratory:  Negative for shortness of breath.   Musculoskeletal:  Positive for joint pain (Bilateral knees).  All other systems reviewed and are negative.      Objective:    Vitals:   12/24/21 1355  12/24/21 1357  BP: (!) 89/52 (!) 103/54  Pulse: (!) 52 62  Resp: 16   SpO2: 96%    Filed Weights   12/24/21 1355  Weight: 187 lb (84.8 kg)      Physical Exam Vitals and nursing note reviewed.  Constitutional:      General: He is not in acute distress. Neck:     Vascular: No JVD.  Cardiovascular:     Rate and Rhythm: Normal rate. Rhythm irregularly irregular.     Pulses: Normal pulses and intact distal pulses.     Heart sounds: Murmur heard.     Midsystolic murmur is present with a grade of 3/6. Aortic Area     No gallop. No S3 or S4 sounds.     Comments: S1 is variable, S2 is normal. Pulmonary:     Effort: Pulmonary effort is normal.     Breath sounds: Normal breath sounds. No wheezing or rales.  Musculoskeletal:        General: Normal range of motion.     Cervical back: Neck supple.     Right lower leg: 2+ Pitting Edema present.     Left lower leg: 2+ Pitting Edema present.  Skin:    General: Skin is warm and dry.  Assessment & Recommendations:   86 year old Caucasian male with coronary artery disease status (LAD PCI 2018), now permanentl atrial fibrillation, complex valvular heart disease, mod AI, mod TR, moderate pulmonary hypertension, venous insufficiency, Parkinsons disease, CKD  Leg edema: Combination of HFpEF, PH and venous insufficiency, and possibly even lymphedema. He is not interested in vascular surgery referral, or endovascular therapies. He was started on Bumex by Dr. Joelyn Oms. Reviewed extremal labs that showed an increase in creatinine to 2.18. Will stop Bumex and restart Lasix 43m twice daily and he will follow-up with nephrologist. Will recheck BMP in 2 weeks. He will see Vascular specialist next week for further evaluation of BLE edema.  Permanent atrial fibrilation: Rate controlled. CHA2DS2VASc score 3: Annual stroke risk 3.2% Continue Eliquis 2.5 mg bid.  Continue diltiazem 120 mg for rate control.   Valvular disease: Mod aortic,  mitral, tricuspid regurgitation, likely all due to atrial fibrilation. Rate control for Afib, as above.  Pulmonary hypertension: WHO Grp II pulmonary hypertension due to left sided heart diseease (HFpEF, valvular heart disease, Afib- RHC 02/2018 showed PCWP 27 mmHg). Recommend continued management of hypertension and diuresis.   F/u in 3 months or sooner if needed.  BErnst Spell AGNP-C [facility]. [place] Pager: [phone]: [phone]

## 2021-12-29 ENCOUNTER — Encounter: Payer: Self-pay | Admitting: Vascular Surgery

## 2021-12-29 ENCOUNTER — Ambulatory Visit (INDEPENDENT_AMBULATORY_CARE_PROVIDER_SITE_OTHER): Payer: Medicare Other | Admitting: Vascular Surgery

## 2021-12-29 VITALS — BP 129/68 | HR 83 | Temp 98.2°F | Resp 18 | Ht 71.0 in | Wt 198.4 lb

## 2021-12-29 DIAGNOSIS — I872 Venous insufficiency (chronic) (peripheral): Secondary | ICD-10-CM

## 2021-12-29 DIAGNOSIS — I890 Lymphedema, not elsewhere classified: Secondary | ICD-10-CM | POA: Diagnosis not present

## 2021-12-29 NOTE — Progress Notes (Signed)
ASSESSMENT & PLAN   COMBINED CHRONIC VENOUS INSUFFICIENCY AND LYMPHEDEMA: Based on the patient's exam I think he has combined chronic venous insufficiency (CEAP C4).  We have had a long discussion about the importance of leg elevation and the proper positioning for this.  He is somewhat limited and that he has neck issues and cannot lie completely flat.  I have also encouraged him to continue to use his compression stockings.  He has knee-high stockings with a gradient of 20-30 he believes.  We also discussed the importance of exercise.  He does exercise about an hour every day which is certainly helpful.  We also discussed water aerobics however he does not tolerate getting in the water apparently.  I have encouraged him to avoid prolonged sitting and standing.  We also discussed the importance of maintaining a healthy weight.  I would like to see him back in 4 weeks and obtain formal venous reflux testing on the right which is the more symptomatic side.  If he has not shown significant improvement I think he would be a good candidate for a BioTAB full leg bilateral pneumatic compression device.  REASON FOR CONSULT:    Stasis dermatitis.  The consult is requested by Dr. Pearson Grippe  HPI:   [name] is a 86 y.o. male who is referred with stasis dermatitis of both lower extremities.  I have reviewed the records from the referring office.  The patient was seen by Dr. Joelyn Oms on 12/22/2021.  This patient has stage III chronic kidney disease, hypertension, chronic congestive heart failure, Parkinson's disease, A-fib (on DOAC), and a history of prostate cancer.  This patient has had swelling in both legs for at least 2 years.  It came on gradually.  He does have chronic kidney disease and some of the swelling has been attributed to fluid retention related to this.  He has had a history of prostate cancer and had radiation seeds.  This could potentially be contributing to his lymphedema.   He is also had Baker's cyst of both lower extremities.  He describes aching pain and heaviness in both legs which is aggravated by standing and relieved with elevation.  He has been wearing knee-high compression stockings.  He believes the gradient is 20-30.  He does exercise every day for about an hour according to his wife.  He is on Eliquis for A-fib.  Past Medical History:  Diagnosis Date   Arthritis    "knees; lower back" (08/09/2016)   Atrial fibrillation ([place])    remote hx/notes 08/07/2016   Coronary artery disease    Episodes of trembling    "most of the time it's my left hand" (08/09/2016)   GI bleeding 08/2017   High cholesterol    History of kidney stones    Hypertension    Melanoma of shoulder ([place]) 2000s   "left"   Mitral regurgitation and mitral stenosis    hx/notes 08/07/2016   Moderate aortic stenosis    hx/notes 08/07/2016   Parkinson's disease    Prostate cancer ([place]) 2011   hx/notes 08/07/2016    Family History  Problem Relation Age of Onset   Heart attack Father    Cerebral aneurysm Sister    Healthy Daughter     SOCIAL HISTORY: Social History   Tobacco Use   Smoking status: Never   Smokeless tobacco: Never   Tobacco comments:    "smoked  2 months when I was 20; non since" (08/09/2016)  Substance Use  Topics   Alcohol use: Yes    Alcohol/week: 14.0 standard drinks of alcohol    Types: 14 Shots of liquor per week    Comment: 2 drinks per day    Allergies  Allergen Reactions   Nsaids Other (See Comments)    Cannot have any of these because he is currently taking Eliquis    Current Outpatient Medications  Medication Sig Dispense Refill   acetaminophen (TYLENOL) 325 MG tablet Take 1-2 tablets (325-650 mg total) by mouth every 6 (six) hours as needed for mild pain (pain score 1-3 or temp > 100.5). 100 tablet 0   apixaban (ELIQUIS) 2.5 MG TABS tablet Take 1 tablet (2.5 mg total) by mouth 2 (two) times daily. (0800 & 2000) (Patient taking differently:  Take 5 mg by mouth 2 (two) times daily. (0800 & 2000)) 180 tablet 5   carbidopa-levodopa (SINEMET IR) 25-100 MG tablet Take 2 tablets by mouth 3 (three) times daily. 7am/11am/4pm 540 tablet 2   Cholecalciferol (VITAMIN D-3) 1000 units CAPS Take 1,000 Units by mouth daily. (0800)     dapagliflozin propanediol (FARXIGA) 10 MG TABS tablet Take 1 tablet by mouth daily.     diltiazem (CARDIZEM CD) 120 MG 24 hr capsule TAKE 1 CAPSULE EVERY DAY 90 capsule 0   furosemide (LASIX) 40 MG tablet Take 1 tablet (40 mg total) by mouth 2 (two) times daily. 60 tablet 3   gabapentin (NEURONTIN) 300 MG capsule Take 1 capsule (300 mg total) by mouth 3 (three) times daily. 270 capsule 1   Multiple Vitamins-Minerals (PRESERVISION AREDS PO) Take 1 tablet by mouth daily.     nitroGLYCERIN (NITROSTAT) 0.4 MG SL tablet TAKE 1 TABLET EVERY 5 MINUTES AS NEEDED FOR CHEST PAIN 25 tablet 0   polyethylene glycol (MIRALAX / GLYCOLAX) packet Take 17 g by mouth 2 (two) times daily. 14 each 0   potassium chloride (KLOR-CON) 10 MEQ tablet Take 2 tablets (20 mEq total) by mouth 2 (two) times daily. (Patient taking differently: Take 40 mEq by mouth 3 (three) times daily.) 60 tablet 3   rosuvastatin (CRESTOR) 10 MG tablet Take 10 mg by mouth daily at 8 pm. (2000)     metolazone (ZAROXOLYN) 2.5 MG tablet TAKE 1 TABLET (2.5 MG TOTAL) BY MOUTH AS DIRECTED TWICE A WEEK (Patient not taking: Reported on 12/29/2021) 26 tablet 2   No current facility-administered medications for this visit.    REVIEW OF SYSTEMS:  '[X]'$  denotes positive finding, '[ ]'$  denotes negative finding Cardiac  Comments:  Chest pain or chest pressure:    Shortness of breath upon exertion:    Short of breath when lying flat:    Irregular heart rhythm:        Vascular    Pain in calf, thigh, or hip brought on by ambulation:    Pain in feet at night that wakes you up from your sleep:     Blood clot in your veins:    Leg swelling:  x       Pulmonary    Oxygen at home:     Productive cough:     Wheezing:         Neurologic    Sudden weakness in arms or legs:     Sudden numbness in arms or legs:     Sudden onset of difficulty speaking or slurred speech:    Temporary loss of vision in one eye:     Problems with dizziness:  Gastrointestinal    Blood in stool:     Vomited blood:         Genitourinary    Burning when urinating:     Blood in urine:        Psychiatric    Major depression:         Hematologic    Bleeding problems:    Problems with blood clotting too easily:        Skin    Rashes or ulcers:        Constitutional    Fever or chills:    -  PHYSICAL EXAM:   Vitals:   12/29/21 1102  BP: 129/68  Pulse: 83  Resp: 18  Temp: 98.2 F (36.8 C)  TempSrc: Temporal  SpO2: 98%  Weight: 198 lb 6.4 oz (90 kg)  Height: '5\' 11"'$  (1.803 m)   Body mass index is 27.67 kg/m.  GENERAL: The patient is a well-nourished male, in no acute distress. The vital signs are documented above. CARDIAC: There is a regular rate and rhythm.  VASCULAR: I do not detect carotid bruits. He has biphasic Doppler signals in the dorsalis pedis and posterior tibial positions bilaterally. He has significant swelling of both lower extremities with hyperpigmentation, some lymphorrhea, and hyperkeratosis.   PULMONARY: There is good air exchange bilaterally without wheezing or rales. ABDOMEN: Soft and non-tender with normal pitched bowel sounds.  MUSCULOSKELETAL: There are no major deformities. NEUROLOGIC: No focal weakness or paresthesias are detected. SKIN: There are no ulcers or rashes noted. PSYCHIATRIC: The patient has a normal affect.  DATA:    LABS: I reviewed his labs from 04/21/2021.  His GFR was 38.  Creatinine 1.76.  ECHO: I reviewed his echo from 12/16/2021.  This shows the left ventricle cavity is normal in size.  He has normal global wall motion.  Left ventricular systolic function ejection fraction is 61%.  Venous duplex scan on 06/05/2020  showed no evidence of DVT of the left lower extremity.   Deitra Mayo [facility]

## 2022-01-04 ENCOUNTER — Other Ambulatory Visit: Payer: Self-pay

## 2022-01-04 DIAGNOSIS — I872 Venous insufficiency (chronic) (peripheral): Secondary | ICD-10-CM

## 2022-01-05 DIAGNOSIS — I10 Essential (primary) hypertension: Secondary | ICD-10-CM | POA: Diagnosis not present

## 2022-01-05 DIAGNOSIS — G629 Polyneuropathy, unspecified: Secondary | ICD-10-CM | POA: Diagnosis not present

## 2022-01-05 DIAGNOSIS — G20A1 Parkinson's disease without dyskinesia, without mention of fluctuations: Secondary | ICD-10-CM | POA: Diagnosis not present

## 2022-01-05 DIAGNOSIS — I2510 Atherosclerotic heart disease of native coronary artery without angina pectoris: Secondary | ICD-10-CM | POA: Diagnosis not present

## 2022-01-05 DIAGNOSIS — R600 Localized edema: Secondary | ICD-10-CM | POA: Diagnosis not present

## 2022-01-05 DIAGNOSIS — N1832 Chronic kidney disease, stage 3b: Secondary | ICD-10-CM | POA: Diagnosis not present

## 2022-01-05 DIAGNOSIS — E782 Mixed hyperlipidemia: Secondary | ICD-10-CM | POA: Diagnosis not present

## 2022-01-17 DIAGNOSIS — D692 Other nonthrombocytopenic purpura: Secondary | ICD-10-CM | POA: Diagnosis not present

## 2022-01-17 DIAGNOSIS — L821 Other seborrheic keratosis: Secondary | ICD-10-CM | POA: Diagnosis not present

## 2022-01-17 DIAGNOSIS — I872 Venous insufficiency (chronic) (peripheral): Secondary | ICD-10-CM | POA: Diagnosis not present

## 2022-01-17 DIAGNOSIS — Z85828 Personal history of other malignant neoplasm of skin: Secondary | ICD-10-CM | POA: Diagnosis not present

## 2022-01-17 DIAGNOSIS — I8312 Varicose veins of left lower extremity with inflammation: Secondary | ICD-10-CM | POA: Diagnosis not present

## 2022-01-17 DIAGNOSIS — D485 Neoplasm of uncertain behavior of skin: Secondary | ICD-10-CM | POA: Diagnosis not present

## 2022-01-17 DIAGNOSIS — C44319 Basal cell carcinoma of skin of other parts of face: Secondary | ICD-10-CM | POA: Diagnosis not present

## 2022-01-17 DIAGNOSIS — Z85820 Personal history of malignant melanoma of skin: Secondary | ICD-10-CM | POA: Diagnosis not present

## 2022-01-17 DIAGNOSIS — L308 Other specified dermatitis: Secondary | ICD-10-CM | POA: Diagnosis not present

## 2022-01-17 DIAGNOSIS — L570 Actinic keratosis: Secondary | ICD-10-CM | POA: Diagnosis not present

## 2022-01-17 DIAGNOSIS — I8311 Varicose veins of right lower extremity with inflammation: Secondary | ICD-10-CM | POA: Diagnosis not present

## 2022-01-19 ENCOUNTER — Encounter: Payer: Self-pay | Admitting: Vascular Surgery

## 2022-01-19 ENCOUNTER — Ambulatory Visit (HOSPITAL_COMMUNITY)
Admission: RE | Admit: 2022-01-19 | Discharge: 2022-01-19 | Disposition: A | Discharge status: Home / Self Care | Payer: Medicare Other | Source: Ambulatory Visit | Attending: Vascular Surgery | Admitting: Vascular Surgery

## 2022-01-19 ENCOUNTER — Ambulatory Visit (INDEPENDENT_AMBULATORY_CARE_PROVIDER_SITE_OTHER): Payer: Medicare Other | Admitting: Vascular Surgery

## 2022-01-19 VITALS — BP 138/70 | HR 90 | Temp 97.8°F | Resp 18 | Ht 71.0 in | Wt 207.1 lb

## 2022-01-19 DIAGNOSIS — I872 Venous insufficiency (chronic) (peripheral): Secondary | ICD-10-CM | POA: Diagnosis not present

## 2022-01-19 DIAGNOSIS — I890 Lymphedema, not elsewhere classified: Secondary | ICD-10-CM | POA: Diagnosis not present

## 2022-01-19 NOTE — Progress Notes (Signed)
REASON FOR VISIT:   Follow-up of combined chronic venous insufficiency and lymphedema.  MEDICAL ISSUES:   COMBINED CHRONIC VENOUS INSUFFICIENCY AND LYMPHEDEMA: This patient has combined chronic venous insufficiency and lymphedema.  He has deep venous reflux documented on his duplex scan today.  He has secondary lymphedema which is severe.  He has developed lymphorrhea and is developing some skin breakdown.  He also has significant hyperkeratosis.  He has been elevating his legs for several hours a day according to his wife.  His ability to exercise is limited by his leg swelling.  He has been wearing his compression stockings routinely.  As they have been more difficult to get on lately they may use the underlying milder pressure gradient with a Velcro wrap on top of that.  He has no significant superficial venous reflux on his duplex so he is not a candidate for laser ablation.  Given the severity of his lymphedema I think he would be an excellent candidate for a full leg length bilateral BioTAB pneumatic compression device.  We will make that referral.  I will see him back in 2 months to see what kind of progress he is making.  HPI:   [name] is a pleasant 86 y.o. male who I saw on 12/29/2021 with combined chronic venous insufficiency and lymphedema.  He had been referred with stasis dermatitis of both lower extremities.  He also has stage III chronic kidney disease, hypertension, congestive heart failure, Parkinson's disease, and A-fib.  He is on a DOAC.  He had swelling in both legs for at least 2 years.  This was gradually getting worse.  He had been wearing compression stockings.  These were a 20-30 gradient.  Based on his exam I felt that he had combined chronic venous insufficiency (CEAP C4) and lymphedema.  We again discussed the importance of leg elevation and the proper positioning for this.  I encouraged him to continue to use his compression stockings which he had been  using.  We also discussed the importance of exercise.  I recommended a 4-week follow-up visit so that we can get formal venous reflux testing.  I felt that he might be a good candidate for a BioTAB full leg bilateral pneumatic compression device.   Since I saw him last, he has been no significant change in his leg swelling.  He has now developed lymphorrhea.  He is having a harder time getting on his compression stockings.  He has been elevating his legs is much as possible.  His ability to exercise is limited by his leg swelling.   Past Medical History:  Diagnosis Date   Arthritis    "knees; lower back" (08/09/2016)   Atrial fibrillation ([place])    remote hx/notes 08/07/2016   Coronary artery disease    Episodes of trembling    "most of the time it's my left hand" (08/09/2016)   GI bleeding 08/2017   High cholesterol    History of kidney stones    Hypertension    Melanoma of shoulder ([place]) 2000s   "left"   Mitral regurgitation and mitral stenosis    hx/notes 08/07/2016   Moderate aortic stenosis    hx/notes 08/07/2016   Parkinson's disease    Prostate cancer ([place]) 2011   hx/notes 08/07/2016    Family History  Problem Relation Age of Onset   Heart attack Father    Cerebral aneurysm Sister    Healthy Daughter     SOCIAL HISTORY: Social History  Tobacco Use   Smoking status: Never   Smokeless tobacco: Never   Tobacco comments:    "smoked  2 months when I was 20; non since" (08/09/2016)  Substance Use Topics   Alcohol use: Yes    Alcohol/week: 14.0 standard drinks of alcohol    Types: 14 Shots of liquor per week    Comment: 2 drinks per day    Allergies  Allergen Reactions   Nsaids Other (See Comments)    Cannot have any of these because he is currently taking Eliquis    Current Outpatient Medications  Medication Sig Dispense Refill   acetaminophen (TYLENOL) 325 MG tablet Take 1-2 tablets (325-650 mg total) by mouth every 6 (six) hours as needed for mild pain (pain  score 1-3 or temp > 100.5). 100 tablet 0   apixaban (ELIQUIS) 2.5 MG TABS tablet Take 1 tablet (2.5 mg total) by mouth 2 (two) times daily. (0800 & 2000) (Patient taking differently: Take 5 mg by mouth 2 (two) times daily. (0800 & 2000)) 180 tablet 5   carbidopa-levodopa (SINEMET IR) 25-100 MG tablet Take 2 tablets by mouth 3 (three) times daily. 7am/11am/4pm 540 tablet 2   Cholecalciferol (VITAMIN D-3) 1000 units CAPS Take 1,000 Units by mouth daily. (0800)     dapagliflozin propanediol (FARXIGA) 10 MG TABS tablet Take 1 tablet by mouth daily.     diltiazem (CARDIZEM CD) 120 MG 24 hr capsule TAKE 1 CAPSULE EVERY DAY 90 capsule 0   furosemide (LASIX) 40 MG tablet Take 1 tablet (40 mg total) by mouth 2 (two) times daily. (Patient taking differently: Take 40 mg by mouth 3 (three) times daily.) 60 tablet 3   gabapentin (NEURONTIN) 300 MG capsule Take 1 capsule (300 mg total) by mouth 3 (three) times daily. 270 capsule 1   Multiple Vitamins-Minerals (PRESERVISION AREDS PO) Take 1 tablet by mouth daily.     nitroGLYCERIN (NITROSTAT) 0.4 MG SL tablet TAKE 1 TABLET EVERY 5 MINUTES AS NEEDED FOR CHEST PAIN 25 tablet 0   polyethylene glycol (MIRALAX / GLYCOLAX) packet Take 17 g by mouth 2 (two) times daily. (Patient taking differently: Take 17 g by mouth daily as needed.) 14 each 0   potassium chloride (KLOR-CON) 10 MEQ tablet Take 2 tablets (20 mEq total) by mouth 2 (two) times daily. (Patient taking differently: Take 40 mEq by mouth 3 (three) times daily.) 60 tablet 3   rosuvastatin (CRESTOR) 10 MG tablet Take 10 mg by mouth daily at 8 pm. (2000)     metolazone (ZAROXOLYN) 2.5 MG tablet TAKE 1 TABLET (2.5 MG TOTAL) BY MOUTH AS DIRECTED TWICE A WEEK (Patient not taking: Reported on 12/29/2021) 26 tablet 2   No current facility-administered medications for this visit.    REVIEW OF SYSTEMS:  '[X]'$  denotes positive finding, '[ ]'$  denotes negative finding Cardiac  Comments:  Chest pain or chest pressure:     Shortness of breath upon exertion:    Short of breath when lying flat:    Irregular heart rhythm:        Vascular    Pain in calf, thigh, or hip brought on by ambulation:    Pain in feet at night that wakes you up from your sleep:  x   Blood clot in your veins:    Leg swelling:  x       Pulmonary    Oxygen at home:    Productive cough:     Wheezing:  Neurologic    Sudden weakness in arms or legs:     Sudden numbness in arms or legs:     Sudden onset of difficulty speaking or slurred speech:    Temporary loss of vision in one eye:     Problems with dizziness:         Gastrointestinal    Blood in stool:     Vomited blood:         Genitourinary    Burning when urinating:     Blood in urine:        Psychiatric    Major depression:         Hematologic    Bleeding problems:    Problems with blood clotting too easily:        Skin    Rashes or ulcers:        Constitutional    Fever or chills:     PHYSICAL EXAM:   Vitals:   01/19/22 1543  BP: 138/70  Pulse: 90  Resp: 18  Temp: 97.8 F (36.6 C)  TempSrc: Temporal  SpO2: 96%  Weight: 207 lb 1.6 oz (93.9 kg)  Height: '5\' 11"'$  (1.803 m)    GENERAL: The patient is a well-nourished male, in no acute distress. The vital signs are documented above. CARDIAC: There is a regular rate and rhythm.  VASCULAR: He has persistent bilateral lower extremity swelling.  He has hyperpigmentation and lymphorrhea in addition to hyperkeratosis.  He is starting to develop some skin breakdown.  PULMONARY: There is good air exchange bilaterally without wheezing or rales. MUSCULOSKELETAL: There are no major deformities or cyanosis. NEUROLOGIC: No focal weakness or paresthesias are detected. SKIN: There are no ulcers or rashes noted. PSYCHIATRIC: The patient has a normal affect.  DATA:    VENOUS DUPLEX: I have independently interpreted his venous duplex scan today.  This was of the right lower extremity only.  There was no  evidence of DVT.  There was deep venous reflux involving the common femoral vein.  There was no significant superficial venous reflux.  The results of this study are summarized on the diagram below.    Deitra Mayo [facility] [phone]

## 2022-01-24 DIAGNOSIS — H35362 Drusen (degenerative) of macula, left eye: Secondary | ICD-10-CM | POA: Diagnosis not present

## 2022-01-31 ENCOUNTER — Other Ambulatory Visit: Payer: Self-pay | Admitting: Cardiology

## 2022-01-31 DIAGNOSIS — I10 Essential (primary) hypertension: Secondary | ICD-10-CM

## 2022-02-03 ENCOUNTER — Other Ambulatory Visit: Payer: Self-pay | Admitting: Neurology

## 2022-02-03 ENCOUNTER — Other Ambulatory Visit: Payer: Self-pay

## 2022-02-03 DIAGNOSIS — G20A1 Parkinson's disease without dyskinesia, without mention of fluctuations: Secondary | ICD-10-CM

## 2022-02-03 MED ORDER — CARBIDOPA-LEVODOPA 25-100 MG PO TABS: ORAL_TABLET | ORAL | 0 refills | Status: DC

## 2022-02-08 DIAGNOSIS — H04123 Dry eye syndrome of bilateral lacrimal glands: Secondary | ICD-10-CM | POA: Diagnosis not present

## 2022-02-22 ENCOUNTER — Telehealth: Payer: Self-pay | Admitting: Neurology

## 2022-02-22 ENCOUNTER — Emergency Department (HOSPITAL_COMMUNITY)
Admission: EM | Admit: 2022-02-22 | Discharge: 2022-02-22 | Discharge status: Against Medical Advice | Payer: Medicare Other | Attending: Emergency Medicine | Admitting: Emergency Medicine

## 2022-02-22 ENCOUNTER — Other Ambulatory Visit: Payer: Self-pay

## 2022-02-22 ENCOUNTER — Encounter (HOSPITAL_COMMUNITY): Payer: Self-pay | Admitting: *Deleted

## 2022-02-22 DIAGNOSIS — Z5321 Procedure and treatment not carried out due to patient leaving prior to being seen by health care provider: Secondary | ICD-10-CM | POA: Insufficient documentation

## 2022-02-22 DIAGNOSIS — G20C Parkinsonism, unspecified: Secondary | ICD-10-CM | POA: Diagnosis not present

## 2022-02-22 DIAGNOSIS — R600 Localized edema: Secondary | ICD-10-CM | POA: Diagnosis not present

## 2022-02-22 DIAGNOSIS — R9431 Abnormal electrocardiogram [ECG] [EKG]: Secondary | ICD-10-CM | POA: Diagnosis not present

## 2022-02-22 DIAGNOSIS — R2243 Localized swelling, mass and lump, lower limb, bilateral: Secondary | ICD-10-CM | POA: Diagnosis not present

## 2022-02-22 LAB — URINALYSIS, ROUTINE W REFLEX MICROSCOPIC
Bilirubin Urine: NEGATIVE
Glucose, UA: 500 mg/dL — AB
Hgb urine dipstick: NEGATIVE
Ketones, ur: NEGATIVE mg/dL
Leukocytes,Ua: NEGATIVE
Nitrite: NEGATIVE
Protein, ur: 30 mg/dL — AB
Specific Gravity, Urine: 1.02 (ref 1.005–1.030)
pH: 5.5 (ref 5.0–8.0)

## 2022-02-22 LAB — BASIC METABOLIC PANEL
Anion gap: 12 (ref 5–15)
BUN: 22 mg/dL (ref 8–23)
CO2: 26 mmol/L (ref 22–32)
Calcium: 9.2 mg/dL (ref 8.9–10.3)
Chloride: 101 mmol/L (ref 98–111)
Creatinine, Ser: 1.7 mg/dL — ABNORMAL HIGH (ref 0.61–1.24)
GFR, Estimated: 39 mL/min — ABNORMAL LOW (ref >60)
Glucose, Bld: 97 mg/dL (ref 70–99)
Potassium: 4.3 mmol/L (ref 3.5–5.1)
Sodium: 139 mmol/L (ref 135–145)

## 2022-02-22 LAB — BRAIN NATRIURETIC PEPTIDE: B Natriuretic Peptide: 222 pg/mL — ABNORMAL HIGH (ref 0.0–100.0)

## 2022-02-22 NOTE — Telephone Encounter (Signed)
Patient is not lucid at all. Patient in Stage 4 kidney disease and his legs are weeping badly and that  Renal insufficiency             -Follows with nephrology patient is hallucinating and this only began a week ago. Patient needs to be screened for UTI or sepsis. Spoke to Carrizo Hill about his call as well I instructed patient and friend to go to ED for further evaluation

## 2022-02-22 NOTE — Telephone Encounter (Signed)
Pt's friend Vaughan Basta called in stating the pt has been hallucinating and having strange dreams at night. This has gone on about a week now. They are not sure what can be done to help him.

## 2022-02-22 NOTE — ED Provider Triage Note (Signed)
Emergency Medicine Provider Triage Evaluation Note  [name] , a 86 y.o. male  was evaluated in triage.  Pt complains of bilateral lower extremity edema.  History of parkinsonism and kidney disease.  He has had progressively worsening peripheral edema, weeping, open sores in the lower extremities.  Talk to his neurologist who told him to come in to make sure his kidney function was okay and that he did not have any urinary tract infections.  No signs or symptoms of systemic infection.  Review of Systems  Positive: swelling Negative: sob  Physical Exam  BP (!) 152/71 (BP Location: Right Arm)   Pulse 78   Temp 97.8 F (36.6 C) (Oral)   Resp 14   Ht '5\' 11"'$  (1.803 m)   Wt 92.5 kg   SpO2 99%   BMI 28.45 kg/m  Gen:   Awake, no distress   Resp:  Normal effort  MSK:   Moves extremities without difficulty  Other:    Medical Decision Making  Medically screening exam initiated at 5:07 PM.  Appropriate orders placed.  Evans Lance was informed that the remainder of the evaluation will be completed by another provider, this initial triage assessment does not replace that evaluation, and the importance of remaining in the ED until their evaluation is complete.     Margarita Mail, PA-C 02/22/22 2308

## 2022-02-22 NOTE — ED Triage Notes (Signed)
Wife states he has had leg swelling and weakness since last Thursday along with Hallucinations. PCP concerned it may be a UTI resulting in Hallucinations.Bil weeping and swelling in legs.

## 2022-02-23 DIAGNOSIS — I10 Essential (primary) hypertension: Secondary | ICD-10-CM | POA: Diagnosis not present

## 2022-02-23 DIAGNOSIS — R600 Localized edema: Secondary | ICD-10-CM | POA: Diagnosis not present

## 2022-02-23 DIAGNOSIS — N1832 Chronic kidney disease, stage 3b: Secondary | ICD-10-CM | POA: Diagnosis not present

## 2022-02-23 DIAGNOSIS — I482 Chronic atrial fibrillation, unspecified: Secondary | ICD-10-CM | POA: Diagnosis not present

## 2022-03-01 DIAGNOSIS — R600 Localized edema: Secondary | ICD-10-CM | POA: Diagnosis not present

## 2022-03-01 DIAGNOSIS — N183 Chronic kidney disease, stage 3 unspecified: Secondary | ICD-10-CM | POA: Diagnosis not present

## 2022-03-02 LAB — BASIC METABOLIC PANEL
BUN/Creatinine Ratio: 21 (ref 10–24)
BUN: 35 mg/dL — ABNORMAL HIGH (ref 8–27)
CO2: 33 mmol/L — ABNORMAL HIGH (ref 20–29)
Calcium: 9.6 mg/dL (ref 8.6–10.2)
Chloride: 87 mmol/L — ABNORMAL LOW (ref 96–106)
Creatinine, Ser: 1.64 mg/dL — ABNORMAL HIGH (ref 0.76–1.27)
Glucose: 93 mg/dL (ref 70–99)
Potassium: 3.1 mmol/L — ABNORMAL LOW (ref 3.5–5.2)
Sodium: 140 mmol/L (ref 134–144)
eGFR: 41 mL/min/{1.73_m2} — ABNORMAL LOW (ref >59)

## 2022-03-08 DIAGNOSIS — E876 Hypokalemia: Secondary | ICD-10-CM | POA: Diagnosis not present

## 2022-03-08 DIAGNOSIS — I129 Hypertensive chronic kidney disease with stage 1 through stage 4 chronic kidney disease, or unspecified chronic kidney disease: Secondary | ICD-10-CM | POA: Diagnosis not present

## 2022-03-08 DIAGNOSIS — N183 Chronic kidney disease, stage 3 unspecified: Secondary | ICD-10-CM | POA: Diagnosis not present

## 2022-03-08 DIAGNOSIS — I50810 Right heart failure, unspecified: Secondary | ICD-10-CM | POA: Diagnosis not present

## 2022-03-08 DIAGNOSIS — I872 Venous insufficiency (chronic) (peripheral): Secondary | ICD-10-CM | POA: Diagnosis not present

## 2022-03-23 ENCOUNTER — Ambulatory Visit (INDEPENDENT_AMBULATORY_CARE_PROVIDER_SITE_OTHER): Payer: Medicare Other | Admitting: Vascular Surgery

## 2022-03-23 ENCOUNTER — Encounter: Payer: Self-pay | Admitting: Vascular Surgery

## 2022-03-23 VITALS — BP 132/71 | HR 90 | Temp 97.9°F | Resp 18 | Ht 71.0 in | Wt 194.6 lb

## 2022-03-23 DIAGNOSIS — I872 Venous insufficiency (chronic) (peripheral): Secondary | ICD-10-CM

## 2022-03-23 DIAGNOSIS — I890 Lymphedema, not elsewhere classified: Secondary | ICD-10-CM | POA: Diagnosis not present

## 2022-03-23 NOTE — Progress Notes (Signed)
REASON FOR VISIT:   Follow-up of combined chronic venous insufficiency and lymphedema  MEDICAL ISSUES:   COMBINED CHRONIC VENOUS INSUFFICIENCY AND LYMPHEDEMA: This patient has extensive wounds especially on the right side with severe lymphedema.  He is waiting on a pneumatic compression device which I think is critical.  He is having a hard time elevating his legs because of the wounds on the posterior aspect of his right leg.  It is difficult for him to exercise because of his arthritis, neuropathy, and leg swelling.  He cannot get any compression stockings that fit and is having to use Ace bandages.  We will work with [employer] to try to get his pneumatic compression devices approved for soon as possible.  I have also referred him to the wound care clinic for dressing changes and compression therapy.  I plan on seeing him back in 2 months.  He knows to call sooner if he has problems.  HPI:   Colbi Staubs is a pleasant 87 y.o. male who I saw on 01/19/2022 with combined chronic venous insufficiency and lymphedema.  He had deep venous reflux documented on his duplex scan.  He had severe secondary lymphedema.  This was causing lymphorrhea and significant skin breakdown.  In addition he has significant hyperkeratosis.  He been elevating his legs for several hours a day.  His ability to exercise was limited by his leg swelling.  He has been wearing his compression stockings routinely.  He did not have any significant superficial venous reflux.  Given the severity of his swelling I felt that he would be an excellent candidate for a full leg length [employer] pneumatic compression device if the swelling did not improve with these conservative measures.  He comes in for 35-monthfollow-up visit.  Since I saw him last, he has developed wounds on both legs.  Swelling on the right is severe he has wounds on the posterior aspect of his leg.  He has a hard time elevating his legs for too long because of the  wounds on the posterior aspect of the leg.  His activity is limited by his arthritis, neuropathy, and leg swelling.  We are working on getting approved for a pneumatic compression device but there have been some delays.  Past Medical History:  Diagnosis Date   Arthritis    "knees; lower back" (08/09/2016)   [place]    remote hx/notes 08/07/2016   Coronary artery disease    Episodes of trembling    "most of the time it's my left hand" (08/09/2016)   GI bleeding 08/2017   High cholesterol    History of kidney stones    Hypertension    [place] [facility] [place]   "[place]"   Mitral regurgitation and mitral stenosis    hx/notes 08/07/2016   Moderate aortic stenosis    hx/notes 08/07/2016   Parkinson's disease    Prostate cancer [place]   hx/notes 08/07/2016    Family History  Problem Relation Age of Onset   Heart attack Father    Cerebral aneurysm Sister    Healthy Daughter     SOCIAL HISTORY: Social History   Tobacco Use   Smoking status: Never   Smokeless tobacco: Never   Tobacco comments:    "smoked  2 months when I was 20; non since" (08/09/2016)  Substance Use Topics   Alcohol use: Yes    Alcohol/week: 14.0 standard drinks of alcohol    Types: 14 Shots of  liquor per week    Comment: 2 drinks per day    Allergies  Allergen Reactions   Nsaids Other (See Comments)    Cannot have any of these because he is currently taking Eliquis    Current Outpatient Medications  Medication Sig Dispense Refill   acetaminophen (TYLENOL) 325 MG tablet Take 1-2 tablets (325-650 mg total) by mouth every 6 (six) hours as needed for mild pain (pain score 1-3 or temp > 100.5). 100 tablet 0   apixaban (ELIQUIS) 2.5 MG TABS tablet Take 1 tablet (2.5 mg total) by mouth 2 (two) times daily. (0800 & 2000) (Patient taking differently: Take 5 mg by mouth 2 (two) times daily. (0800 & 2000)) 180 tablet 5   carbidopa-levodopa (SINEMET IR) 25-100 MG tablet TAKE 2 TABLETS THREE  TIMES DAILY AT 7:00AM, 11:00AM AND 4:00PM 540 tablet 0   Cholecalciferol (VITAMIN D-3) 1000 units CAPS Take 1,000 Units by mouth daily. (0800)     dapagliflozin propanediol (FARXIGA) 10 MG TABS tablet Take 1 tablet by mouth daily.     diltiazem (CARDIZEM CD) 120 MG 24 hr capsule TAKE 1 CAPSULE EVERY DAY 90 capsule 3   furosemide (LASIX) 40 MG tablet Take 1 tablet (40 mg total) by mouth 2 (two) times daily. (Patient taking differently: Take 40 mg by mouth 3 (three) times daily.) 60 tablet 3   gabapentin (NEURONTIN) 300 MG capsule Take 1 capsule (300 mg total) by mouth 3 (three) times daily. 270 capsule 1   metolazone (ZAROXOLYN) 2.5 MG tablet TAKE 1 TABLET (2.5 MG TOTAL) BY MOUTH AS DIRECTED TWICE A WEEK 26 tablet 2   Multiple Vitamins-Minerals (PRESERVISION AREDS PO) Take 1 tablet by mouth daily.     nitroGLYCERIN (NITROSTAT) 0.4 MG SL tablet TAKE 1 TABLET EVERY 5 MINUTES AS NEEDED FOR CHEST PAIN 25 tablet 0   polyethylene glycol (MIRALAX / GLYCOLAX) packet Take 17 g by mouth 2 (two) times daily. (Patient taking differently: Take 17 g by mouth daily as needed.) 14 each 0   potassium chloride (KLOR-CON) 10 MEQ tablet Take 2 tablets (20 mEq total) by mouth 2 (two) times daily. (Patient taking differently: Take 40 mEq by mouth 3 (three) times daily.) 60 tablet 3   rosuvastatin (CRESTOR) 10 MG tablet Take 10 mg by mouth daily at 8 pm. (2000)     No current facility-administered medications for this visit.    REVIEW OF SYSTEMS:  '[X]'$  denotes positive finding, '[ ]'$  denotes negative finding Cardiac  Comments:  Chest pain or chest pressure:    Shortness of breath upon exertion:    Short of breath when lying flat:    Irregular heart rhythm:        Vascular    Pain in calf, thigh, or hip brought on by ambulation: x   Pain in feet at night that wakes you up from your sleep:  x   Blood clot in your veins:    Leg swelling:  x       Pulmonary    Oxygen at home:    Productive cough:     Wheezing:          Neurologic    Sudden weakness in arms or legs:     Sudden numbness in arms or legs:     Sudden onset of difficulty speaking or slurred speech:    Temporary loss of vision in one eye:     Problems with dizziness:         Gastrointestinal  Blood in stool:     Vomited blood:         Genitourinary    Burning when urinating:     Blood in urine:        Psychiatric    Major depression:         Hematologic    Bleeding problems:    Problems with blood clotting too easily:        Skin    Rashes or ulcers:        Constitutional    Fever or chills:     PHYSICAL EXAM:   Vitals:   03/23/22 1550  BP: 132/71  Pulse: 90  Resp: 18  Temp: 97.9 F (36.6 C)  TempSrc: Temporal  SpO2: 99%  Weight: 194 lb 9.6 oz (88.3 kg)  Height: '5\' 11"'$  (1.803 m)    GENERAL: The patient is a well-nourished male, in no acute distress. The vital signs are documented above. CARDIAC: There is a regular rate and rhythm.  VASCULAR: He has brisk Doppler signals in the dorsalis pedis and posterior tibial positions bilaterally. He has significant bilateral lower extremity swelling which is more significant on the right side.  He has lymphorrhea bilaterally.  He has wounds on both legs as documented below.       PULMONARY: There is good air exchange bilaterally without wheezing or rales. ABDOMEN: Soft and non-tender with normal pitched bowel sounds.  MUSCULOSKELETAL: There are no major deformities or cyanosis. NEUROLOGIC: No focal weakness or paresthesias are detected. SKIN: There are no ulcers or rashes noted. PSYCHIATRIC: The patient has a normal affect.  DATA:    No new data  Deitra Mayo [facility] [phone]

## 2022-03-24 ENCOUNTER — Encounter: Payer: Self-pay | Admitting: Cardiology

## 2022-03-24 ENCOUNTER — Ambulatory Visit: Payer: Medicare Other | Admitting: Cardiology

## 2022-03-24 ENCOUNTER — Ambulatory Visit: Payer: Medicare Other

## 2022-03-24 VITALS — BP 123/57 | HR 66 | Resp 16 | Ht 71.0 in | Wt 194.8 lb

## 2022-03-24 DIAGNOSIS — I10 Essential (primary) hypertension: Secondary | ICD-10-CM | POA: Diagnosis not present

## 2022-03-24 DIAGNOSIS — I272 Pulmonary hypertension, unspecified: Secondary | ICD-10-CM

## 2022-03-24 DIAGNOSIS — I4821 Permanent atrial fibrillation: Secondary | ICD-10-CM

## 2022-03-24 NOTE — Progress Notes (Signed)
c    Patient is here for follow up visit. Subjective:   '@Patient'$  ID: [name], male    DOB: [birth year], 87 y.o.   MRN: [id number]   Chief Complaint  Patient presents with   Permanent atrial fibrillation   Chronic heart failure with preserved ejection fraction   Follow-up    3 month    Congestive Heart Failure Pertinent negatives include no chest pain, palpitations or shortness of breath.   87 year old Caucasian male with coronary artery disease status (LAD PCI 2018), permanent atrial fibrillation, complex valvular heart disease, mod AI, mod TR, moderate pulmonary hypertension, Parkinsons disease, CKD, chronic b/l leg edema  Unfortunately, he had worsening leg edema, and now with nonhealing ulcers on both legs.  He saw vascular surgeon Dr. Doren Custard, who has recommended pneumatic compression tubes for lymphorrhea.  It appears that he does not have significant venous sufficiency that could benefit from laser ablation.   Current Outpatient Medications:    acetaminophen (TYLENOL) 325 MG tablet, Take 1-2 tablets (325-650 mg total) by mouth every 6 (six) hours as needed for mild pain (pain score 1-3 or temp > 100.5)., Disp: 100 tablet, Rfl: 0   apixaban (ELIQUIS) 2.5 MG TABS tablet, Take 1 tablet (2.5 mg total) by mouth 2 (two) times daily. (0800 & 2000) (Patient taking differently: Take 5 mg by mouth 2 (two) times daily. (0800 & 2000)), Disp: 180 tablet, Rfl: 5   carbidopa-levodopa (SINEMET IR) 25-100 MG tablet, TAKE 2 TABLETS THREE TIMES DAILY AT 7:00AM, 11:00AM AND 4:00PM, Disp: 540 tablet, Rfl: 0   Cholecalciferol (VITAMIN D-3) 1000 units CAPS, Take 1,000 Units by mouth daily. (0800), Disp: , Rfl:    dapagliflozin propanediol (FARXIGA) 10 MG TABS tablet, Take 1 tablet by mouth daily., Disp: , Rfl:    diltiazem (CARDIZEM CD) 120 MG 24 hr capsule, TAKE 1 CAPSULE EVERY DAY, Disp: 90 capsule, Rfl: 3   furosemide (LASIX) 40 MG tablet, Take 1 tablet (40 mg total) by mouth 2 (two)  times daily. (Patient taking differently: Take 40 mg by mouth 3 (three) times daily.), Disp: 60 tablet, Rfl: 3   gabapentin (NEURONTIN) 300 MG capsule, Take 1 capsule (300 mg total) by mouth 3 (three) times daily., Disp: 270 capsule, Rfl: 1   metolazone (ZAROXOLYN) 2.5 MG tablet, TAKE 1 TABLET (2.5 MG TOTAL) BY MOUTH AS DIRECTED TWICE A WEEK, Disp: 26 tablet, Rfl: 2   Multiple Vitamins-Minerals (PRESERVISION AREDS PO), Take 1 tablet by mouth daily., Disp: , Rfl:    nitroGLYCERIN (NITROSTAT) 0.4 MG SL tablet, TAKE 1 TABLET EVERY 5 MINUTES AS NEEDED FOR CHEST PAIN, Disp: 25 tablet, Rfl: 0   polyethylene glycol (MIRALAX / GLYCOLAX) packet, Take 17 g by mouth 2 (two) times daily. (Patient taking differently: Take 17 g by mouth daily as needed.), Disp: 14 each, Rfl: 0   potassium chloride (KLOR-CON) 10 MEQ tablet, Take 2 tablets (20 mEq total) by mouth 2 (two) times daily. (Patient taking differently: Take 40 mEq by mouth 3 (three) times daily.), Disp: 60 tablet, Rfl: 3   rosuvastatin (CRESTOR) 10 MG tablet, Take 10 mg by mouth daily at 8 pm. (2000), Disp: , Rfl:   Cardiovascular studies:  EKG 03/24/2022: Atrial fibrillation 67 bpm  Right bundle branch block  RHC 03/20/2018: WHO Grp II pulmonary hypertension Mean PA 42 mmHg, PVR 2.4 WU Normal RV systolic function (PAPi 2.4) Recommendation: Continue medical management with aggressive diuresis.  Echocardiogram 12/16/2021: Left ventricle cavity is normal in size. Normal left ventricular  wall thickness. Normal global wall motion. Normal LV systolic function with EF 61%. Unable to evaluate diastolic function due to atrial fibrillation.  Left atrial cavity is moderately dilated at 41.8 ml/m^2. Right atrial cavity is moderately dilated. Trileaflet aortic valve.  Moderate aortic valve leaflet calcification. Moderate (Grade II) aortic regurgitation.  Moderate (Grade II) mitral regurgitation.  Mild to moderate tricuspid regurgitation. The IVC is not  seen. Pulmonary hypertension seen in 2019 not appreciated on this study.  Event monitor 04/27/2017 - 05/26/2017: Persistent atrial fibrillation with controlled ventricular rate.   Two episodes of 3 beat nonsustained VT 4.6 second pause at 11:34 PM on 05/03/2017.   3.9 second pause at 7 AM on 05/14/2017.  Symptoms not reported. Occasional episodes of atypical atrial flutter with variable conduction  Lower extremity venous duplex 04/07/2017: No evidence of deep vein thrombosis of the right lower extremity with normal venous return. No evidence of valvular incompetence (chronic venous insufficiency) of the right lower extremity.  No evidence of deep vein thrombosis of the left lower extremity with normal venous return. Chronic valvular incompetence (chronic venous insufficiency) of the left lower extremity. Marked reflux noted in the left sapheno-femoral junction (2.5 s), great saphenous proximal thigh (2.6 s), great saphenous mid thigh (1.6 s), great saphenous distal thigh (3.9 s) and great saphenous proximal calf (3.4 s) veins.  Cardioversion 01/26/2017: S/p successful TEE guided cardioversion 100 JS/p successful TEE guided cardioversion 100 J  Coronary angiogram 08/09/2016: FFR guided CSI atherectomy followed by 3.0 x 26 and 3.0 x 18 mm onyx resolute DES to Mid LAD. Mild disease in Cx and RCA.  Lower Extremity Dopplers 06/2009: Pt has Baker Cyst behind both knees.   Recent labs: 03/01/2022: Glucose 93, BUN/Cr 35/1.64. EGFR 41. Na/K 140/3.1.  BNP 222   Review of Systems  Eyes:  Negative for visual disturbance.  Cardiovascular:  Positive for leg swelling. Negative for chest pain, dyspnea on exertion (Mild, stable), palpitations and syncope.  Respiratory:  Negative for shortness of breath.   Musculoskeletal:  Positive for joint pain (Bilateral knees).  All other systems reviewed and are negative.      Objective:    Vitals:   03/24/22 1258  BP: (!) 123/57  Pulse: 66  Resp: 16    Filed Weights   03/24/22 1258  Weight: 194 lb 12.8 oz (88.4 kg)      Physical Exam Vitals and nursing note reviewed.  Constitutional:      General: He is not in acute distress. Neck:     Vascular: No JVD.  Cardiovascular:     Rate and Rhythm: Normal rate. Rhythm irregularly irregular.     Pulses: Normal pulses and intact distal pulses.     Heart sounds: Murmur heard.     Midsystolic murmur is present with a grade of 3/6. Aortic Area     No gallop. No S3 or S4 sounds.     Comments: S1 is variable, S2 is normal. Pulmonary:     Effort: Pulmonary effort is normal.     Breath sounds: Normal breath sounds. No wheezing or rales.  Musculoskeletal:        General: Normal range of motion.     Cervical back: Neck supple.     Right lower leg: 2+ Pitting Edema (3+) present.     Left lower leg: 2+ Pitting Edema (3+) present.  Skin:    General: Skin is warm and dry.     Comments: B/l leg wounds. Currently dressed  Assessment & Recommendations:   87 year old Caucasian male with coronary artery disease status (LAD PCI 2018), permanent atrial fibrillation, complex valvular heart disease, mod AI, mod TR, moderate pulmonary hypertension, Parkinsons disease, CKD, chronic b/l leg edema  Leg edema: Bilateral lymphedema.  Management as per Dr. Scot Dock.  Patient is awaiting pneumatic compression device. Appreciate Dr. Nicole Cella assistance. In addition, continue lasix 40 mg bid.  Permanent atrial fibrilation: Rate controlled. CHA2DS2VASc score 3: Annual stroke risk 3.2% Continue Eliquis 2.5 mg bid.  Continue diltiazem 120 mg for rate control.   Valvular disease: Mod aortic, mitral, tricuspid regurgitation, likely all due to atrial fibrilation. Rate control for Afib, as above.  Pulmonary hypertension: WHO Grp II pulmonary hypertension due to left sided heart diseease (HFpEF, valvular heart disease, Afib- RHC 02/2018 showed PCWP 27 mmHg). Recommend continued management of  hypertension and diuresis.   F/u in 3 months or sooner if needed.   [name], MD Pager: [phone] Office: [phone]

## 2022-03-25 ENCOUNTER — Encounter: Payer: Self-pay | Admitting: Cardiology

## 2022-03-30 ENCOUNTER — Encounter (HOSPITAL_BASED_OUTPATIENT_CLINIC_OR_DEPARTMENT_OTHER): Payer: Medicare Other | Attending: General Surgery | Admitting: General Surgery

## 2022-03-30 DIAGNOSIS — Z7901 Long term (current) use of anticoagulants: Secondary | ICD-10-CM | POA: Diagnosis not present

## 2022-03-30 DIAGNOSIS — I5032 Chronic diastolic (congestive) heart failure: Secondary | ICD-10-CM | POA: Insufficient documentation

## 2022-03-30 DIAGNOSIS — L97819 Non-pressure chronic ulcer of other part of right lower leg with unspecified severity: Secondary | ICD-10-CM | POA: Insufficient documentation

## 2022-03-30 DIAGNOSIS — I872 Venous insufficiency (chronic) (peripheral): Secondary | ICD-10-CM | POA: Insufficient documentation

## 2022-03-30 DIAGNOSIS — M1990 Unspecified osteoarthritis, unspecified site: Secondary | ICD-10-CM | POA: Insufficient documentation

## 2022-03-30 DIAGNOSIS — I4891 Unspecified atrial fibrillation: Secondary | ICD-10-CM | POA: Insufficient documentation

## 2022-03-30 DIAGNOSIS — I890 Lymphedema, not elsewhere classified: Secondary | ICD-10-CM | POA: Diagnosis not present

## 2022-03-30 DIAGNOSIS — G20A1 Parkinson's disease without dyskinesia, without mention of fluctuations: Secondary | ICD-10-CM | POA: Insufficient documentation

## 2022-03-30 DIAGNOSIS — L97822 Non-pressure chronic ulcer of other part of left lower leg with fat layer exposed: Secondary | ICD-10-CM | POA: Diagnosis not present

## 2022-03-30 DIAGNOSIS — L97812 Non-pressure chronic ulcer of other part of right lower leg with fat layer exposed: Secondary | ICD-10-CM | POA: Diagnosis not present

## 2022-03-30 DIAGNOSIS — N183 Chronic kidney disease, stage 3 unspecified: Secondary | ICD-10-CM | POA: Insufficient documentation

## 2022-03-30 DIAGNOSIS — I130 Hypertensive heart and chronic kidney disease with heart failure and stage 1 through stage 4 chronic kidney disease, or unspecified chronic kidney disease: Secondary | ICD-10-CM | POA: Diagnosis not present

## 2022-03-30 DIAGNOSIS — I2510 Atherosclerotic heart disease of native coronary artery without angina pectoris: Secondary | ICD-10-CM | POA: Diagnosis not present

## 2022-03-30 DIAGNOSIS — I272 Pulmonary hypertension, unspecified: Secondary | ICD-10-CM | POA: Insufficient documentation

## 2022-03-30 DIAGNOSIS — L97829 Non-pressure chronic ulcer of other part of left lower leg with unspecified severity: Secondary | ICD-10-CM | POA: Insufficient documentation

## 2022-03-30 DIAGNOSIS — I420 Dilated cardiomyopathy: Secondary | ICD-10-CM | POA: Insufficient documentation

## 2022-03-30 DIAGNOSIS — L97212 Non-pressure chronic ulcer of right calf with fat layer exposed: Secondary | ICD-10-CM | POA: Diagnosis not present

## 2022-03-30 DIAGNOSIS — Z923 Personal history of irradiation: Secondary | ICD-10-CM | POA: Diagnosis not present

## 2022-03-30 NOTE — Progress Notes (Addendum)
[name], [name] ([id number]) [id number] Page 1 of 2 Visit Report for 03/30/2022 Chief Complaint Document Details Patient Name: Date of Service: [name], [name] 03/30/2022 9:30 A M Medical Record Number: [id number] Patient Account Number: [id number] Date of Birth/Sex: Treating RN: [birth year] (87 y.o. M) Primary Care Provider: Carol Ada Other Clinician: Referring Provider: Treating Provider/Extender: Lianne Bushy Weeks in Treatment: 0 Information Obtained from: Patient Chief Complaint Patient presents for treatment of open ulcers due to venous insufficiency and lymphedema Electronic Signature(s) Signed: 03/30/2022 9:34:57 AM By: Fredirick Maudlin MD FACS Entered By: Fredirick Maudlin on 03/30/2022 09:34:57 -------------------------------------------------------------------------------- HPI Details Patient Name: Date of Service: [name], [name] 03/30/2022 9:30 A M Medical Record Number: [id number] Patient Account Number: [id number] Date of Birth/Sex: Treating RN: [birth year] (87 y.o. M) Primary Care Provider: Carol Ada Other Clinician: Referring Provider: Treating Provider/Extender: Lianne Bushy Weeks in Treatment: 0 History of Present Illness HPI Description: ADMISSION This is an 87 year old man with a past medical history significant for congestive heart failure, CKD stage III, Parkinson's disease, venous insufficiency, pulmonary hypertension, chronic A-fib/a flutter on Eliquis, coronary artery disease, and lymphedema. Electronic Signature(s) Signed: 03/30/2022 9:36:41 AM By: Fredirick Maudlin MD FACS Entered By: Fredirick Maudlin on 03/30/2022 09:36:41 -------------------------------------------------------------------------------- Problem List Details Patient Name: Date of Service: [name], [name] 03/30/2022 9:30 A M Medical Record Number: [id number] Patient Account Number: [id number] Date of  Birth/Sex: Treating RN: April 05, 1935 (87 y.o. M) Primary Care Provider: Carol Ada Other Clinician: Referring Provider: Treating Provider/Extender: Prudencio Burly, Candace Weeks in Treatment: 0 DAO, MEMMOTT [name] ([id number]) [id number] Page 2 of 2 Active Problems ICD-10 Encounter Code Description Active Date MDM Diagnosis L97.819 Non-pressure chronic ulcer of other part of right lower leg with unspecified 03/30/2022 No Yes severity L97.829 Non-pressure chronic ulcer of other part of left lower leg with unspecified 03/30/2022 No Yes severity I89.0 Lymphedema, not elsewhere classified 03/30/2022 No Yes I87.2 Venous insufficiency (chronic) (peripheral) 03/30/2022 No Yes I27.20 Pulmonary hypertension, unspecified 03/30/2022 No Yes B76.28 Chronic diastolic (congestive) heart failure 03/30/2022 No Yes N18.30 Chronic kidney disease, stage 3 unspecified 03/30/2022 No Yes Z79.01 Long term (current) use of anticoagulants 03/30/2022 No Yes Inactive Problems Resolved Problems Electronic Signature(s) Signed: 03/30/2022 9:34:32 AM By: Fredirick Maudlin MD FACS Entered By: Fredirick Maudlin on 03/30/2022 09:34:31

## 2022-03-31 NOTE — Progress Notes (Signed)
[name], [name] ([id number]) [id number] Page 1 of 15 Visit Report for 03/30/2022 Allergy List Details Patient Name: Date of Service: [name], [name] 03/30/2022 9:30 A [name] Medical Record Number: [id number] Patient Account Number: [id number] Date of Birth/Sex: Treating RN: 06-Aug-1935 (87 y.o. [name] Primary Care [name]: [name] Other Clinician: Referring [name]: Treating [name]/Extender: [name], [name] in Treatment: 0 Allergies Active Allergies NSAIDS (Non-Steroidal Anti-Inflammatory Drug) Reaction: on blood thinner Allergy Notes Electronic Signature(s) Signed: 03/30/2022 6:15:21 PM By: [name] Gouty RN, BSN Entered By: [name] on 03/30/2022 09:57:40 -------------------------------------------------------------------------------- Arrival Information Details Patient Name: Date of Service: [name], [name] 03/30/2022 9:30 A [name] Medical Record Number: [id number] Patient Account Number: [id number] Date of Birth/Sex: Treating RN: [birth year] (87 y.o. [name] Primary Care [name]: [name] Other Clinician: Referring Kalanie Fewell: Treating [name]/Extender: [name] [name] in Treatment: 0 Visit Information Patient Arrived: Wheel Chair Arrival Time: 09:40 Accompanied By: spouse Transfer Assistance: None Patient Identification Verified: Yes Secondary Verification Process Completed: Yes Patient Requires Transmission-Based Precautions: No Patient Has Alerts: Yes Patient Alerts: Patient on Blood Thinner Bil ABIs N/C Electronic Signature(s) Signed: 03/30/2022 6:15:21 PM By: [name] Gouty RN, BSN Entered By: [name] on 03/30/2022 Cornell, [name] ([id number]) [id number] Page 2 of 15 -------------------------------------------------------------------------------- Clinic Level of Care Assessment Details Patient Name:  Date of Service: [name], [name] 03/30/2022 9:30 A [name] Medical Record Number: [id number] Patient Account Number: [id number] Date of Birth/Sex: Treating RN: Aug 13, 1935 (87 y.o. [name] Primary Care [name]: [name] Other Clinician: Referring [name]: Treating [name]: [name] [name] in Treatment: 0 Clinic Level of Care Assessment Items TOOL 1 Quantity Score '[]'$  - 0 Use when [name] and Procedure is performed on INITIAL visit ASSESSMENTS - Nursing Assessment / Reassessment X- 1 20 General Physical Exam (combine w/ comprehensive assessment (listed just below) when performed on new pt. evals) X- 1 25 Comprehensive Assessment (HX, ROS, Risk Assessments, Wounds Hx, etc.) ASSESSMENTS - Wound and Skin Assessment / Reassessment '[]'$  - 0 Dermatologic / Skin Assessment (not related to wound area) ASSESSMENTS - Ostomy and/or Continence Assessment and Care '[]'$  - 0 Incontinence Assessment and Management '[]'$  - 0 Ostomy Care Assessment and Management (repouching, etc.) PROCESS - Coordination of Care X - Simple Patient / Family Education for ongoing care 1 15 '[]'$  - 0 Complex (extensive) Patient / Family Education for ongoing care X- 1 10 Staff obtains Programmer, systems, Records, T Results / Process Orders est '[]'$  - 0 Staff telephones HHA, Nursing Homes / Clarify orders / etc '[]'$  - 0 Routine Transfer to another Facility (non-emergent condition) '[]'$  - 0 Routine Hospital Admission (non-emergent condition) X- 1 15 New Admissions / Biomedical engineer / Ordering NPWT Apligraf, etc. , '[]'$  - 0 Emergency Hospital Admission (emergent condition) PROCESS - Special Needs '[]'$  - 0 Pediatric / Minor Patient Management '[]'$  - 0 Isolation Patient Management '[]'$  - 0 Hearing / Language / Visual special needs '[]'$  - 0 Assessment of Community assistance (transportation, D/C planning, etc.) '[]'$  - 0 Additional assistance / Altered mentation '[]'$  - 0 Support Surface(s)  Assessment (bed, cushion, seat, etc.) INTERVENTIONS - Miscellaneous '[]'$  - 0 External ear exam '[]'$  - 0 Patient Transfer (multiple staff / Civil Service fast streamer / Similar devices) '[]'$  - 0 Simple Staple / Suture removal (25 or less) '[]'$  - 0 Complex Staple / Suture removal (26 or more) '[]'$  - 0 Hypo/Hyperglycemic Management (do not check if billed separately) X- 1 15 Ankle / Brachial  Index (ABI) - do not check if billed separately Has the patient been seen at the hospital within the last three years: Yes Total Score: 100 Level Of Care: New/Established - Level 3 Electronic Signature(s) Signed: 03/30/2022 6:15:21 PM By: [name] Gouty RN, BSN Entered By: [name] on 03/30/2022 17:38:30 [name] ([id number]) [phone].pdf Page 3 of 15 -------------------------------------------------------------------------------- Compression Therapy Details Patient Name: Date of Service: [name], [name] 03/30/2022 9:30 A [name] Medical Record Number: [id number] Patient Account Number: [id number] Date of Birth/Sex: Treating RN: [birth year] (87 y.o. [name] Primary Care [name]: [name] Other Clinician: Referring [name]: Treating [name]/Extender: [name] [name] in Treatment: 0 Compression Therapy Performed for Wound Assessment: Wound #1 Right,Anterior Lower Leg Performed By: Clinician [name] Gouty, RN Compression Type: Four Layer Post Procedure Diagnosis Same as Pre-procedure Electronic Signature(s) Signed: 03/30/2022 6:15:21 PM By: [name] Gouty RN, BSN Entered By: [name] on 03/30/2022 11:03:26 -------------------------------------------------------------------------------- Compression Therapy Details Patient Name: Date of Service: [name], [name] 03/30/2022 9:30 A [name] Medical Record Number: [id number] Patient Account Number: [id number] Date of Birth/Sex: Treating RN: [birth year] (87 y.o. [name] Primary Care Sai Zinn: [name] Other Clinician: Referring [name]: Treating [name]/Extender: [name] [name] in Treatment: 0 Compression Therapy Performed for Wound Assessment: Wound #4 Left,Anterior Lower Leg Performed By: Clinician [name] Gouty, RN Compression Type: Four Layer Post Procedure Diagnosis Same as Pre-procedure Electronic Signature(s) Signed: 03/30/2022 6:15:21 PM By: [name] Gouty RN, BSN Entered By: [name] on 03/30/2022 11:03:26 -------------------------------------------------------------------------------- Encounter Discharge Information Details Patient Name: Date of Service: [name], [name] 03/30/2022 9:30 A [name] Medical Record Number: [id number] Patient Account Number: [id number] Date of Birth/Sex: Treating RN: 1935/07/01 (87 y.o. [name] Primary Care [employer]aya Mcjunkins: [name] Other Clinician: Referring Amelio Brosky: Treating Sheyna Pettibone/Extender: [name] [name] in Treatment: 0 Encounter Discharge Information Items Post Procedure Vitals Discharge Condition: Stable Temperature (F): 97.9 Ambulatory Status: Wheelchair Pulse (bpm): 86 Discharge Destination: Home Respiratory Rate (breaths/min): 18 Morea, Haik [name] ([id number]) [phone].pdf Page 4 of 15 Transportation: Private Auto Blood Pressure (mmHg): 131/72 Accompanied By: spouse Schedule Follow-up Appointment: Yes Clinical Summary of Care: Patient Declined Electronic Signature(s) Signed: 03/30/2022 6:15:21 PM By: [name] Gouty RN, BSN Entered By: [name] on 03/30/2022 17:40:28 -------------------------------------------------------------------------------- Lower Extremity Assessment Details Patient Name: Date of Service: [name], [name] 03/30/2022 9:30 A [name] Medical Record Number: [id number] Patient Account Number: [id number] Date of Birth/Sex: Treating RN: 24-Mar-1935 (87 y.o. [name] Primary Care Trecia Maring: [name] Other Clinician: Referring Tamirra Sienkiewicz: Treating Leobardo Granlund/Extender: [name], [name] in Treatment: 0 Edema Assessment Assessed: [Left: No] [Right: No] Edema: [Left: Yes] [Right: Yes] Calf Left: Right: Point of Measurement: From Medial Instep 45 cm 46 cm Ankle Left: Right: Point of Measurement: From Medial Instep 25.5 cm 24.8 cm Vascular Assessment Pulses: Dorsalis Pedis Palpable: [Left:Yes] [Right:Yes] Notes Right andLeft DP and PT pulses noncompressible Electronic Signature(s) Signed: 03/30/2022 6:15:21 PM By: [name] Gouty RN, BSN Entered By: [name] on 03/30/2022 10:36:35 -------------------------------------------------------------------------------- Multi Wound Chart Details Patient Name: Date of Service: [name], [name] 03/30/2022 9:30 A [name] Medical Record Number: [id number] Patient Account Number: [id number] Date of Birth/Sex: Treating RN: Aug 21, 1935 (87 y.o. [name]) Primary Care Leara Rawl: [name] Other Clinician: Referring Lylianna Fraiser: Treating Landry Kamath/Extender: [name], [name] in Treatment: 0 Vital Signs Height(in): 71 Pulse(bpm): 86 Weight(lbs): 198 Blood Pressure(mmHg): 131/72 [name], [name] ([id number]) [phone].pdf Page 5 of 15 Body Mass Index(BMI): 27.6 Temperature(F): 97.9 Respiratory  Rate(breaths/min): 18 [1:Photos:] Right, Anterior Lower Leg Right, Posterior Lower Leg Right, Distal, Posterior Lower Leg Wound Location: Gradually Appeared Blister Blister Wounding Event: Lymphedema Lymphedema Lymphedema Primary Etiology: Venous Leg Ulcer Venous Leg Ulcer Venous Leg Ulcer Secondary Etiology: Cataracts, Lymphedema, Arrhythmia, Cataracts, Lymphedema, Arrhythmia, Cataracts, Lymphedema, Arrhythmia, Comorbid History: Congestive Heart Failure, Coronary Congestive Heart Failure, Coronary Congestive Heart Failure,  Coronary Artery Disease, Hypertension, Artery Disease, Hypertension, Artery Disease, Hypertension, Osteoarthritis, Received Radiation, Osteoarthritis, Received Radiation, Osteoarthritis, Received Radiation, Confinement Anxiety Confinement Anxiety Confinement Anxiety 01/17/2022 01/17/2022 01/17/2022 Date Acquired: 0 0 0 [name] of Treatment: Open Open Open Wound Status: No No No Wound Recurrence: 0.8x0.9x0.1 1.3x3.4x0.1 0.7x1.1x0.1 Measurements L x W x D (cm) 0.565 3.471 0.605 A (cm) : rea 0.057 0.347 0.06 Volume (cm) : Full Thickness Without Exposed Full Thickness Without Exposed Full Thickness Without Exposed Classification: Support Structures Support Structures Support Structures Medium Medium Medium Exudate A mount: Serosanguineous Serosanguineous Serosanguineous Exudate Type: red, brown red, brown red, brown Exudate Color: Indistinct, nonvisible Flat and Intact Flat and Intact Wound Margin: Large (67-100%) Large (67-100%) Large (67-100%) Granulation A mount: Pink Pink Pink Granulation Quality: Small (1-33%) Small (1-33%) Small (1-33%) Necrotic A mount: Adherent [place] Adherent Slough Necrotic Tissue: Fat Layer (Subcutaneous Tissue): Yes Fat Layer (Subcutaneous Tissue): Yes Fat Layer (Subcutaneous Tissue): Yes Exposed Structures: Fascia: No Fascia: No Fascia: No Tendon: No Tendon: No Tendon: No Muscle: No Muscle: No Muscle: No Joint: No Joint: No Joint: No Bone: No Bone: No Bone: No Large (67-100%) Large (67-100%) Large (67-100%) Epithelialization: N/A Debridement - Selective/Open Wound N/A Debridement: Pre-procedure Verification/Time Out N/A 10:55 N/A Taken: N/A Lidocaine 4% Topical Solution N/A Pain Control: N/A Slough N/A Tissue Debrided: N/A Non-Viable Tissue N/A Level: N/A 1 N/A Debridement A (sq cm): rea N/A Curette N/A Instrument: N/A Minimum N/A Bleeding: N/A Pressure N/A Hemostasis A chieved: N/A 0 N/A Procedural  Pain: N/A 0 N/A Post Procedural Pain: N/A Procedure was tolerated well N/A Debridement Treatment Response: N/A 1.3x3.4x0.1 N/A Post Debridement Measurements L x W x D (cm) N/A 0.347 N/A Post Debridement Volume: (cm) No Abnormalities Noted No Abnormalities Noted No Abnormalities Noted Periwound Skin Texture: Dry/Scaly: Yes Dry/Scaly: Yes Dry/Scaly: Yes Periwound Skin Moisture: Hemosiderin Staining: Yes Hemosiderin Staining: Yes Hemosiderin Staining: Yes Periwound Skin Color: No Abnormality No Abnormality No Abnormality Temperature: Compression Therapy Debridement N/A Procedures Performed: Wound Number: 4 N/A N/A Photos: N/A N/A Left, Anterior Lower Leg N/A N/A Wound Location: Gradually Appeared N/A N/A Wounding Event: [name], [name] ([id number]) [phone].pdf Page 6 of 15 Lymphedema N/A N/A Primary Etiology: Venous Leg Ulcer N/A N/A Secondary Etiology: Cataracts, Lymphedema, Arrhythmia, N/A N/A Comorbid History: Congestive Heart Failure, Coronary Artery Disease, Hypertension, Osteoarthritis, Received Radiation, Confinement Anxiety 01/17/2022 N/A N/A Date Acquired: 0 N/A N/A [name] of Treatment: Open N/A N/A Wound Status: No N/A N/A Wound Recurrence: 0.5x0.3x0.1 N/A N/A Measurements L x W x D (cm) 0.118 N/A N/A A (cm) : rea 0.012 N/A N/A Volume (cm) : Full Thickness Without Exposed N/A N/A Classification: Support Structures Medium N/A N/A Exudate A mount: Serosanguineous N/A N/A Exudate Type: red, brown N/A N/A Exudate Color: Flat and Intact N/A N/A Wound Margin: Large (67-100%) N/A N/A Granulation A mount: Red N/A N/A Granulation Quality: Small (1-33%) N/A N/A Necrotic A mount: Eschar N/A N/A Necrotic Tissue: Fat Layer (Subcutaneous Tissue): Yes N/A N/A Exposed Structures: Fascia: No Tendon: No Muscle: No Joint: No Bone: No Small (1-33%) N/A N/A Epithelialization: Debridement - Selective/Open Wound N/A  N/A Debridement:  Pre-procedure Verification/Time Out 10:55 N/A N/A Taken: Lidocaine 4% Topical Solution N/A N/A Pain Control: Necrotic/Eschar N/A N/A Tissue Debrided: Non-Viable Tissue N/A N/A Level: 0.15 N/A N/A Debridement A (sq cm): rea Curette N/A N/A Instrument: Minimum N/A N/A Bleeding: Pressure N/A N/A Hemostasis A chieved: 0 N/A N/A Procedural Pain: 0 N/A N/A Post Procedural Pain: Procedure was tolerated well N/A N/A Debridement Treatment Response: 0.5x0.3x0.1 N/A N/A Post Debridement Measurements L x W x D (cm) 0.012 N/A N/A Post Debridement Volume: (cm) No Abnormalities Noted N/A N/A Periwound Skin Texture: Dry/Scaly: Yes N/A N/A Periwound Skin Moisture: Hemosiderin Staining: Yes N/A N/A Periwound Skin Color: No Abnormality N/A N/A Temperature: Compression Therapy N/A N/A Procedures Performed: Debridement Treatment Notes Electronic Signature(s) Signed: 03/30/2022 11:23:25 AM By: Fredirick Maudlin MD FACS Entered By: Fredirick Maudlin on 03/30/2022 11:23:24 -------------------------------------------------------------------------------- Multi-Disciplinary Care Plan Details Patient Name: Date of Service: [name], [name] 03/30/2022 9:30 A [name] Medical Record Number: [id number] Patient Account Number: [id number] Date of Birth/Sex: Treating RN: 05-27-1935 (87 y.o. [name] Primary Care Tevis Dunavan: [name] Other Clinician: Referring Yosiah Jasmin: Treating Schneur Crowson/Extender: [name] [name] in Treatment: 0 Multidisciplinary Care Plan reviewed with physician Active Inactive MISTER, KRAHENBUHL ([id number]) [id number] Page 7 of 15 Abuse / Safety / Falls / Self Care Management Nursing Diagnoses: History of Falls Potential for falls Goals: Patient/caregiver will verbalize/demonstrate measures taken to prevent injury and/or falls Date Initiated: 03/30/2022 Target Resolution Date: 04/25/2022 Goal  Status: Active Interventions: Assess fall risk on admission and as needed Assess impairment of mobility on admission and as needed per policy Notes: Venous Leg Ulcer Nursing Diagnoses: Actual venous Insuffiency (use after diagnosis is confirmed) Knowledge deficit related to disease process and management Potential for venous Insuffiency (use before diagnosis confirmed) Goals: Patient will maintain optimal edema control Date Initiated: 03/30/2022 Target Resolution Date: 04/27/2022 Goal Status: Active Patient/caregiver will verbalize understanding of disease process and disease management Date Initiated: 03/30/2022 Target Resolution Date: 04/27/2022 Goal Status: Active Interventions: Assess peripheral edema status every visit. Compression as ordered Provide education on venous insufficiency Treatment Activities: Therapeutic compression applied : 03/30/2022 Notes: Wound/Skin Impairment Nursing Diagnoses: Impaired tissue integrity Knowledge deficit related to ulceration/compromised skin integrity Goals: Patient/caregiver will verbalize understanding of skin care regimen Date Initiated: 03/30/2022 Target Resolution Date: 04/27/2022 Goal Status: Active Ulcer/skin breakdown will have a volume reduction of 30% by week 4 Date Initiated: 03/30/2022 Target Resolution Date: 04/27/2022 Goal Status: Active Interventions: Assess patient/caregiver ability to obtain necessary supplies Assess patient/caregiver ability to perform ulcer/skin care regimen upon admission and as needed Assess ulceration(s) every visit Provide education on ulcer and skin care Treatment Activities: Skin care regimen initiated : 03/30/2022 Topical wound management initiated : 03/30/2022 Notes: Electronic Signature(s) Signed: 03/30/2022 6:15:21 PM By: [name] Gouty RN, BSN Entered By: [name] on 03/30/2022 11:00:11 [name] ([id number]) [id number] Page 8 of  15 -------------------------------------------------------------------------------- Pain Assessment Details Patient Name: Date of Service: RODRICK, PAYSON 03/30/2022 9:30 A [name] Medical Record Number: [id number] Patient Account Number: [id number] Date of Birth/Sex: Treating RN: [birth year] (87 y.o. [name] Primary Care Deuce Paternoster: [name] Other Clinician: Referring Torell Minder: Treating Nat Lowenthal/Extender: [name] [name] in Treatment: 0 Active Problems Location of Pain Severity and Description of Pain Patient Has Paino No Site Locations Rate the pain. Current Pain Level: 0 Pain Management and Medication Current Pain Management: Electronic Signature(s) Signed: 03/30/2022 6:15:21 PM By: [name] Gouty RN, BSN Entered By: [name] on 03/30/2022 10:50:41 -------------------------------------------------------------------------------- Patient/Caregiver Education Details  Patient Name: Date of Service: YAMIL, OELKE 1/31/2024andnbsp9:30 A [name] Medical Record Number: [id number] Patient Account Number: [id number] Date of Birth/Gender: Treating RN: Jan 03, 1936 (87 y.o. [name] Primary Care Physician: [name] Other Clinician: Referring Physician: Treating Physician/Extender: Maxcine Ham in Treatment: 0 Education Assessment Education Provided To: Patient Education Topics Provided Venous: Handouts: Controlling Swelling with Multilayered Compression Wraps, Managing Venous Insufficiency Methods: Explain/Verbal, Printed Responses: Reinforcements needed, State content correctly KIREN, MCISAAC [name] ([id number]) [phone].pdf Page 9 of 15 [facility] Packet: o Handouts: Welcome T [facility] o Methods: Explain/Verbal, Printed Responses: Reinforcements needed, State content correctly Electronic Signature(s) Signed: 03/30/2022 6:15:21 PM  By: [name] Gouty RN, BSN Entered By: [name] on 03/30/2022 11:00:49 -------------------------------------------------------------------------------- Wound Assessment Details Patient Name: Date of Service: [name], Nahom [name]. 03/30/2022 9:30 A [name] Medical Record Number: [id number] Patient Account Number: [id number] Date of Birth/Sex: Treating RN: 1935/03/01 (87 y.o. [name] Primary Care Mat Stuard: [name] Other Clinician: Referring Kanai Berrios: Treating Yvonne Petite/Extender: [name] [name] in Treatment: 0 Wound Status Wound Number: 1 Primary Lymphedema Etiology: Wound Location: Right, Anterior Lower Leg Secondary Venous Leg Ulcer Wounding Event: Gradually Appeared Etiology: Date Acquired: 01/17/2022 Wound Open [name] Of Treatment: 0 Status: Clustered Wound: No Comorbid Cataracts, Lymphedema, Arrhythmia, Congestive Heart Failure, History: Coronary Artery Disease, Hypertension, Osteoarthritis, Received Radiation, Confinement Anxiety Photos Wound Measurements Length: (cm) 0.8 Width: (cm) 0.9 Depth: (cm) 0.1 Area: (cm) 0.565 Volume: (cm) 0.057 % Reduction in Area: % Reduction in Volume: Epithelialization: Large (67-100%) Tunneling: No Undermining: No Wound Description Classification: Full Thickness Without Exposed Suppor Wound Margin: Indistinct, nonvisible Exudate Amount: Medium Exudate Type: Serosanguineous Exudate Color: red, brown t Structures Foul Odor After Cleansing: No Slough/Fibrino Yes Wound Bed Granulation Amount: Large (67-100%) Exposed Structure Granulation Quality: Pink Fascia Exposed: No Necrotic Amount: Small (1-33%) Fat Layer (Subcutaneous Tissue) Exposed: Yes Necrotic Quality: Adherent Slough Tendon Exposed: No Muscle Exposed: No Joint Exposed: No Bone Exposed: No Ibarra, Bunny [name] ([id number]) [id number] Page 10 of 15 Periwound Skin Texture Texture Color No Abnormalities  Noted: Yes No Abnormalities Noted: No Hemosiderin Staining: Yes Moisture No Abnormalities Noted: No Temperature / Pain Dry / Scaly: Yes Temperature: No Abnormality Treatment Notes Wound #1 (Lower Leg) Wound Laterality: Right, Anterior Cleanser Peri-Wound Care Sween Lotion (Moisturizing lotion) Discharge Instruction: Apply moisturizing lotion as directed Topical Primary Dressing Sorbalgon AG Dressing 2x2 (in/in) Discharge Instruction: Apply to wound bed as instructed Secondary Dressing Woven Gauze Sponge, Non-Sterile 4x4 in Discharge Instruction: Apply over primary dressing as directed. Secured With Compression Wrap FourPress (4 layer compression wrap) Discharge Instruction: Apply four layer compression as directed. May also use Miliken CoFlex 2 layer compression system as alternative. Compression Stockings Add-Ons Electronic Signature(s) Signed: 03/30/2022 6:15:21 PM By: [name] Gouty RN, BSN Entered By: [name] on 03/30/2022 10:44:56 -------------------------------------------------------------------------------- Wound Assessment Details Patient Name: Date of Service: [name], Eydan [name]. 03/30/2022 9:30 A [name] Medical Record Number: [id number] Patient Account Number: [id number] Date of Birth/Sex: Treating RN: 08-10-1935 (87 y.o. [name] Primary Care Charina Fons: [name] Other Clinician: Referring Tore Carreker: Treating Mohd. Derflinger/Extender: [name] [name] in Treatment: 0 Wound Status Wound Number: 2 Primary Lymphedema Etiology: Wound Location: Right, Posterior Lower Leg Secondary Venous Leg Ulcer Wounding Event: Blister Etiology: Date Acquired: 01/17/2022 Wound Open [name] Of Treatment: 0 Status: Clustered Wound: No Comorbid Cataracts, Lymphedema, Arrhythmia, Congestive Heart Failure, History: Coronary Artery Disease, Hypertension, Osteoarthritis, Received Radiation, Confinement Anxiety Photos Kundinger,  Andrews [name] ([id number])  [id number] Page 11 of 15 Wound Measurements Length: (cm) 1.3 Width: (cm) 3.4 Depth: (cm) 0.1 Area: (cm) 3.471 Volume: (cm) 0.347 % Reduction in Area: % Reduction in Volume: Epithelialization: Large (67-100%) Tunneling: No Undermining: No Wound Description Classification: Full Thickness Without Exposed Suppor Wound Margin: Flat and Intact Exudate Amount: Medium Exudate Type: Serosanguineous Exudate Color: red, brown t Structures Foul Odor After Cleansing: No Slough/Fibrino Yes Wound Bed Granulation Amount: Large (67-100%) Exposed Structure Granulation Quality: Pink Fascia Exposed: No Necrotic Amount: Small (1-33%) Fat Layer (Subcutaneous Tissue) Exposed: Yes Necrotic Quality: Adherent Slough Tendon Exposed: No Muscle Exposed: No Joint Exposed: No Bone Exposed: No Periwound Skin Texture Texture Color No Abnormalities Noted: Yes No Abnormalities Noted: No Hemosiderin Staining: Yes Moisture No Abnormalities Noted: No Temperature / Pain Dry / Scaly: Yes Temperature: No Abnormality Treatment Notes Wound #2 (Lower Leg) Wound Laterality: Right, Posterior Cleanser Peri-Wound Care Sween Lotion (Moisturizing lotion) Discharge Instruction: Apply moisturizing lotion as directed Topical Primary Dressing Sorbalgon AG Dressing 2x2 (in/in) Discharge Instruction: Apply to wound bed as instructed Secondary Dressing Woven Gauze Sponge, Non-Sterile 4x4 in Discharge Instruction: Apply over primary dressing as directed. Secured With Compression Wrap FourPress (4 layer compression wrap) Discharge Instruction: Apply four layer compression as directed. May also use Miliken CoFlex 2 layer compression system as alternative. Compression Stockings Add-Ons Electronic Signature(s) Signed: 03/30/2022 6:15:21 PM By: [name] Gouty RN, BSN SULT, Vong [name] ([id number]) By: [name] Gouty RN, BSN [id number].pdf Page 12 of 15 Signed:  03/30/2022 6:15:21 PM Entered By: [name] on 03/30/2022 10:46:26 -------------------------------------------------------------------------------- Wound Assessment Details Patient Name: Date of Service: JERMARCUS, MCFADYEN 03/30/2022 9:30 A [name] Medical Record Number: [id number] Patient Account Number: [id number] Date of Birth/Sex: Treating RN: Jul 19, 1935 (87 y.o. [name] Primary Care Emran Molzahn: [name] Other Clinician: Referring Norberta Stobaugh: Treating Lucy Boardman/Extender: [name], [name] in Treatment: 0 Wound Status Wound Number: 3 Primary Lymphedema Etiology: Wound Location: Right, Distal, Posterior Lower Leg Secondary Venous Leg Ulcer Wounding Event: Blister Etiology: Date Acquired: 01/17/2022 Wound Open [name] Of Treatment: 0 Status: Clustered Wound: No Comorbid Cataracts, Lymphedema, Arrhythmia, Congestive Heart Failure, History: Coronary Artery Disease, Hypertension, Osteoarthritis, Received Radiation, Confinement Anxiety Photos Wound Measurements Length: (cm) 0.7 Width: (cm) 1.1 Depth: (cm) 0.1 Area: (cm) 0.605 Volume: (cm) 0.06 % Reduction in Area: % Reduction in Volume: Epithelialization: Large (67-100%) Tunneling: No Undermining: No Wound Description Classification: Full Thickness Without Exposed Suppo Wound Margin: Flat and Intact Exudate Amount: Medium Exudate Type: Serosanguineous Exudate Color: red, brown rt Structures Foul Odor After Cleansing: No Slough/Fibrino Yes Wound Bed Granulation Amount: Large (67-100%) Exposed Structure Granulation Quality: Pink Fascia Exposed: No Necrotic Amount: Small (1-33%) Fat Layer (Subcutaneous Tissue) Exposed: Yes Necrotic Quality: Adherent Slough Tendon Exposed: No Muscle Exposed: No Joint Exposed: No Bone Exposed: No Periwound Skin Texture Texture Color No Abnormalities Noted: Yes No Abnormalities Noted: No Hemosiderin Staining: Yes Moisture No Abnormalities Noted:  No Temperature / Pain Dry / Scaly: Yes Temperature: No Abnormality Kassem, Sammuel [name] ([id number]) [id number] Page 13 of 15 Treatment Notes Wound #3 (Lower Leg) Wound Laterality: Right, Posterior, Distal Cleanser Peri-Wound Care Sween Lotion (Moisturizing lotion) Discharge Instruction: Apply moisturizing lotion as directed Topical Primary Dressing Sorbalgon AG Dressing 2x2 (in/in) Discharge Instruction: Apply to wound bed as instructed Secondary Dressing Woven Gauze Sponge, Non-Sterile 4x4 in Discharge Instruction: Apply over primary dressing as directed. Secured With Compression Wrap FourPress (4 layer compression wrap) Discharge Instruction: Apply four layer compression as directed. May also use Miliken  CoFlex 2 layer compression system as alternative. Compression Stockings Add-Ons Electronic Signature(s) Signed: 03/30/2022 6:15:21 PM By: [name] Gouty RN, BSN Entered By: [name] on 03/30/2022 10:48:15 -------------------------------------------------------------------------------- Wound Assessment Details Patient Name: Date of Service: [name], Kendry [name]. 03/30/2022 9:30 A [name] Medical Record Number: [id number] Patient Account Number: [id number] Date of Birth/Sex: Treating RN: Jan 14, 1936 (87 y.o. [name] Primary Care Jahmeek Shirk: [name] Other Clinician: Referring Mattson Dayal: Treating Berwyn Bigley/Extender: [name], [name] in Treatment: 0 Wound Status Wound Number: 4 Primary Lymphedema Etiology: Wound Location: Left, Anterior Lower Leg Secondary Venous Leg Ulcer Wounding Event: Gradually Appeared Etiology: Date Acquired: 01/17/2022 Wound Open [name] Of Treatment: 0 Status: Clustered Wound: No Comorbid Cataracts, Lymphedema, Arrhythmia, Congestive Heart Failure, History: Coronary Artery Disease, Hypertension, Osteoarthritis, Received Radiation, Confinement Anxiety Photos Wound Measurements Camuso,  Danta [name] ([id number]) Length: (cm) 0.5 Width: (cm) 0.3 Depth: (cm) 0.1 Area: (cm) 0.118 Volume: (cm) 0.012 [id number] Page 14 of 15 % Reduction in Area: % Reduction in Volume: Epithelialization: Small (1-33%) Tunneling: No Undermining: No Wound Description Classification: Full Thickness Without Exposed Suppor Wound Margin: Flat and Intact Exudate Amount: Medium Exudate Type: Serosanguineous Exudate Color: red, brown t Structures Foul Odor After Cleansing: No Slough/Fibrino Yes Wound Bed Granulation Amount: Large (67-100%) Exposed Structure Granulation Quality: Red Fascia Exposed: No Necrotic Amount: Small (1-33%) Fat Layer (Subcutaneous Tissue) Exposed: Yes Necrotic Quality: Eschar Tendon Exposed: No Muscle Exposed: No Joint Exposed: No Bone Exposed: No Periwound Skin Texture Texture Color No Abnormalities Noted: Yes No Abnormalities Noted: No Hemosiderin Staining: Yes Moisture No Abnormalities Noted: No Temperature / Pain Dry / Scaly: Yes Temperature: No Abnormality Treatment Notes Wound #4 (Lower Leg) Wound Laterality: Left, Anterior Cleanser Peri-Wound Care Sween Lotion (Moisturizing lotion) Discharge Instruction: Apply moisturizing lotion as directed Topical Primary Dressing Sorbalgon AG Dressing 2x2 (in/in) Discharge Instruction: Apply to wound bed as instructed Secondary Dressing Woven Gauze Sponge, Non-Sterile 4x4 in Discharge Instruction: Apply over primary dressing as directed. Secured With Compression Wrap FourPress (4 layer compression wrap) Discharge Instruction: Apply four layer compression as directed. May also use Miliken CoFlex 2 layer compression system as alternative. Compression Stockings Add-Ons Electronic Signature(s) Signed: 03/30/2022 6:15:21 PM By: [name] Gouty RN, BSN Entered By: [name] on 03/30/2022  10:50:06 -------------------------------------------------------------------------------- Vitals Details Patient Name: Date of Service: [name], [place] 03/30/2022 9:30 A [name] Medical Record Number: [id number] Patient Account Number: [id number] MATHHEW, BUYSSE ([id number]) [phone].pdf Page 15 of 15 Date of Birth/Sex: Treating RN: Aug 12, 1935 (87 y.o. [name] Primary Care Breta Demedeiros: [name] Other Clinician: Referring Corianna Avallone: Treating Effie Wahlert/Extender: [name] [name] in Treatment: 0 Vital Signs Time Taken: 09:52 Temperature (F): 97.9 Height (in): 71 Pulse (bpm): 86 Source: Stated Respiratory Rate (breaths/min): 18 Weight (lbs): 198 Blood Pressure (mmHg): 131/72 Source: Stated Reference Range: 80 - 120 mg / dl Body Mass Index (BMI): 27.6 Electronic Signature(s) Signed: 03/30/2022 6:15:21 PM By: [name] Gouty RN, BSN Entered By: [name] on 03/30/2022 09:55:33

## 2022-03-31 NOTE — Progress Notes (Signed)
CORDAE, MCCAREY E ([id number]) [id number] Page 1 of 4 Visit Report for 03/30/2022 Abuse Risk Screen Details Patient Name: Date of Service: [name], [name] 03/30/2022 9:30 A M Medical Record Number: [id number] Patient Account Number: [id number] Date of Birth/Sex: Treating RN: [birth year] (87 y.o. [name] Primary Care Add [place]: Carol Ada Other Clinician: Referring Raini Tiley: Treating Esco Joslyn/Extender: Lianne Bushy Weeks in Treatment: 0 Abuse Risk Screen Items Answer ABUSE RISK SCREEN: Has anyone close to you tried to hurt or harm you recentlyo No Do you feel uncomfortable with anyone in your familyo No Has anyone forced you do things that you didnt want to doo No Electronic Signature(s) Signed: 03/30/2022 6:15:21 PM By: Baruch Gouty RN, BSN Entered By: Baruch Gouty on 03/30/2022 10:10:42 -------------------------------------------------------------------------------- Activities of Daily Living Details Patient Name: Date of Service: [name], [name] 03/30/2022 9:30 A M Medical Record Number: [id number] Patient Account Number: [id number] Date of Birth/Sex: Treating RN: 1936-01-12 (87 y.o. [name] Primary Care Pepper Wyndham: Carol Ada Other Clinician: Referring Yazir Koerber: Treating Teena Mangus/Extender: Lianne Bushy Weeks in Treatment: 0 Activities of Daily Living Items Answer Activities of Daily Living (Please select one for each item) Drive Automobile Not Able T Medications ake Need Assistance Use T elephone Completely Able Care for Appearance Completely Able Use T oilet Completely Able Bath / Shower Completely Able Dress Self Completely Able Feed Self Completely Able Walk Need Assistance Get In / Out Bed Completely [place] for Self Need Assistance Electronic Signature(s) Signed:  03/30/2022 6:15:21 PM By: Baruch Gouty RN, BSN Entered By: Baruch Gouty on 03/30/2022 10:11:37 [name] ([id number]) [phone] [id number] Page 2 of 4 -------------------------------------------------------------------------------- Education Screening Details Patient Name: Date of Service: [name], [name] 03/30/2022 9:30 A M Medical Record Number: [id number] Patient Account Number: [id number] Date of Birth/Sex: Treating RN: March 23, 1935 (87 y.o. [name] Primary Care Caidin Heidenreich: Carol Ada Other Clinician: Referring Gionni Freese: Treating Davy Westmoreland/Extender: Lianne Bushy Weeks in Treatment: 0 Primary Learner Assessed: Patient Learning Preferences/Education Level/Primary Language Learning Preference: Explanation, Demonstration, Printed Material Highest Education Level: High School Preferred Language: English Cognitive Barrier Language Barrier: No Translator Needed: No Memory Deficit: No Emotional Barrier: No Cultural/Religious Beliefs Affecting Medical Care: No Physical Barrier Impaired Vision: Yes Glasses Impaired Hearing: No Decreased Hand dexterity: No Knowledge/Comprehension Knowledge Level: High Comprehension Level: High Ability to understand written instructions: High Ability to understand verbal instructions: High Motivation Anxiety Level: Calm Cooperation: Cooperative Education Importance: Acknowledges Need Interest in Health Problems: Asks Questions Perception: Coherent Willingness to Engage in Self-Management High Activities: Readiness to Engage in Self-Management High Activities: Electronic Signature(s) Signed: 03/30/2022 6:15:21 PM By: Baruch Gouty RN, BSN Entered By: Baruch Gouty on 03/30/2022 10:12:59 -------------------------------------------------------------------------------- Fall Risk Assessment Details Patient Name: Date of Service: [name], [name] 03/30/2022 9:30 A  M Medical Record Number: [id number] Patient Account Number: [id number] Date of Birth/Sex: Treating RN: September 08, 1935 (86 y.o. [name] Primary Care Sheniqua Carolan: Carol Ada Other Clinician: Referring Atley Neubert: Treating Christiano Blandon/Extender: Lianne Bushy Weeks in Treatment: 0 Fall Risk Assessment Items Have you had 2 or more falls in the last 12 monthso 0 Yes Shaunte, Weissinger Ajene E ([id number]) [id number] Page 3 of 4 Have you had any fall that resulted in injury in the last 12 monthso 0 No FALLS RISK SCREEN History of falling - immediate or within 3 months 25 Yes Secondary diagnosis (Do you have 2 or more medical diagnoseso)  0 No Ambulatory aid None/bed rest/wheelchair/nurse 0 No Crutches/cane/walker 15 Yes Furniture 0 No Intravenous therapy Access/Saline/Heparin Lock 0 No Gait/Transferring Normal/ bed rest/ wheelchair 0 No Weak (short steps with or without shuffle, stooped but able to lift head while walking, may seek 10 Yes support from furniture) Impaired (short steps with shuffle, may have difficulty arising from chair, head down, impaired 0 No balance) Mental Status Oriented to own ability 0 Yes Electronic Signature(s) Signed: 03/30/2022 6:15:21 PM By: Baruch Gouty RN, BSN Entered By: Baruch Gouty on 03/30/2022 10:13:31 -------------------------------------------------------------------------------- Foot Assessment Details Patient Name: Date of Service: Minus [name], [name] 03/30/2022 9:30 A M Medical Record Number: [id number] Patient Account Number: [id number] Date of Birth/Sex: Treating RN: [birth year] (87 y.o. [name] Primary Care Lavan Imes: Carol Ada Other Clinician: Referring Cadience Bradfield: Treating Rowen Hur/Extender: Prudencio Burly, Candace Weeks in Treatment: 0 Foot Assessment Items Site Locations + = Sensation present, - = Sensation absent, C = Callus, U = Ulcer R = Redness, W = Warmth,  M = Maceration, PU = Pre-ulcerative lesion F = Fissure, S = Swelling, D = Dryness Assessment Right: Left: Other Deformity: No No Prior Foot Ulcer: No No Prior Amputation: No No Charcot Joint: No No Ambulatory Status: Ambulatory With Help Assistance Device: LARNCE, SCHNACKENBERG ([id number]) [phone] [id number] Page 4 of 4 Gait: Administrator, arts) Signed: 03/30/2022 6:15:21 PM By: Baruch Gouty RN, BSN Entered By: Baruch Gouty on 03/30/2022 10:27:41 -------------------------------------------------------------------------------- Nutrition Risk Screening Details Patient Name: Date of Service: ZARON, ZWIEFELHOFER 03/30/2022 9:30 A M Medical Record Number: [id number] Patient Account Number: [id number] Date of Birth/Sex: Treating RN: [birth year] (87 y.o. [name] Primary Care Tammra Pressman: Carol Ada Other Clinician: Referring Reginold Beale: Treating Terique Kawabata/Extender: Prudencio Burly, Candace Weeks in Treatment: 0 Height (in): 71 Weight (lbs): 198 Body Mass Index (BMI): 27.6 Nutrition Risk Screening Items Score Screening NUTRITION RISK SCREEN: I have an illness or condition that made me change the kind and/or amount of food I eat 0 No I eat fewer than two meals per day 0 No I eat few fruits and vegetables, or milk products 0 No I have three or more drinks of beer, liquor or wine almost every day 0 No I have tooth or mouth problems that make it hard for me to eat 0 No I don't always have enough money to buy the food I need 0 No I eat alone most of the time 0 No I take three or more different prescribed or over-the-counter drugs a day 1 Yes Without wanting to, I have lost or gained 10 pounds in the last six months 0 No I am not always physically able to shop, cook and/or feed myself 0 No Nutrition Protocols Good Risk Protocol 0 No interventions needed Moderate Risk Protocol High Risk Proctocol Risk Level: Good  Risk Score: 1 Electronic Signature(s) Signed: 03/30/2022 6:15:21 PM By: Baruch Gouty RN, BSN Entered By: Baruch Gouty on 03/30/2022 10:14:30

## 2022-04-07 ENCOUNTER — Encounter (HOSPITAL_BASED_OUTPATIENT_CLINIC_OR_DEPARTMENT_OTHER): Payer: Medicare Other | Attending: General Surgery | Admitting: General Surgery

## 2022-04-07 ENCOUNTER — Encounter: Payer: Self-pay | Admitting: Vascular Surgery

## 2022-04-07 DIAGNOSIS — L97819 Non-pressure chronic ulcer of other part of right lower leg with unspecified severity: Secondary | ICD-10-CM | POA: Insufficient documentation

## 2022-04-07 DIAGNOSIS — I272 Pulmonary hypertension, unspecified: Secondary | ICD-10-CM | POA: Diagnosis not present

## 2022-04-07 DIAGNOSIS — Z7901 Long term (current) use of anticoagulants: Secondary | ICD-10-CM | POA: Diagnosis not present

## 2022-04-07 DIAGNOSIS — N183 Chronic kidney disease, stage 3 unspecified: Secondary | ICD-10-CM | POA: Insufficient documentation

## 2022-04-07 DIAGNOSIS — L97212 Non-pressure chronic ulcer of right calf with fat layer exposed: Secondary | ICD-10-CM | POA: Diagnosis not present

## 2022-04-07 DIAGNOSIS — I890 Lymphedema, not elsewhere classified: Secondary | ICD-10-CM | POA: Insufficient documentation

## 2022-04-07 DIAGNOSIS — G20A1 Parkinson's disease without dyskinesia, without mention of fluctuations: Secondary | ICD-10-CM | POA: Insufficient documentation

## 2022-04-07 DIAGNOSIS — I5032 Chronic diastolic (congestive) heart failure: Secondary | ICD-10-CM | POA: Insufficient documentation

## 2022-04-07 DIAGNOSIS — L97829 Non-pressure chronic ulcer of other part of left lower leg with unspecified severity: Secondary | ICD-10-CM | POA: Insufficient documentation

## 2022-04-07 DIAGNOSIS — I130 Hypertensive heart and chronic kidney disease with heart failure and stage 1 through stage 4 chronic kidney disease, or unspecified chronic kidney disease: Secondary | ICD-10-CM | POA: Diagnosis not present

## 2022-04-07 DIAGNOSIS — I872 Venous insufficiency (chronic) (peripheral): Secondary | ICD-10-CM | POA: Insufficient documentation

## 2022-04-07 DIAGNOSIS — I482 Chronic atrial fibrillation, unspecified: Secondary | ICD-10-CM | POA: Diagnosis not present

## 2022-04-07 DIAGNOSIS — I4892 Unspecified atrial flutter: Secondary | ICD-10-CM | POA: Insufficient documentation

## 2022-04-07 DIAGNOSIS — L97812 Non-pressure chronic ulcer of other part of right lower leg with fat layer exposed: Secondary | ICD-10-CM | POA: Diagnosis not present

## 2022-04-07 NOTE — Progress Notes (Signed)
[name] ([id number]) [id number] Page 1 of 9 Visit Report for 04/07/2022 Chief Complaint Document Details Patient Name: Date of Service: [name], [name] 04/07/2022 2:00 PM Medical Record Number: [id number] Patient Account Number: [id number] Date of Birth/Sex: Treating RN: 1935/04/22 (87 y.o. M) Primary Care Provider: Carol Ada Other Clinician: Referring Provider: Treating Provider/Extender: Lianne Bushy Weeks in Treatment: 1 Information Obtained from: Patient Chief Complaint Patient presents for treatment of open ulcers due to venous insufficiency and lymphedema Electronic Signature(s) Signed: 04/07/2022 2:37:18 PM By: Fredirick Maudlin MD FACS Entered By: Fredirick Maudlin on 04/07/2022 14:37:18 -------------------------------------------------------------------------------- HPI Details Patient Name: Date of Service: [name] Liberty, Ishmail [name]. 04/07/2022 2:00 PM Medical Record Number: [id number] Patient Account Number: [id number] Date of Birth/Sex: Treating RN: December 09, 1935 (87 y.o. M) Primary Care Provider: Carol Ada Other Clinician: Referring Provider: Treating Provider/Extender: Lianne Bushy Weeks in Treatment: 1 History of Present Illness HPI Description: ADMISSION This is an 87 year old man with a past medical history significant for congestive heart failure, CKD stage III, Parkinson's disease, venous insufficiency, pulmonary hypertension, chronic A-fib/a flutter on Eliquis, coronary artery disease, and lymphedema. He has been followed in the vascular surgery clinic by Dr. Scot Dock. Per Dr. Nicole Cella last note, lymphedema pumps have been ordered, but he has not yet received them. He referred the patient to the wound care clinic for closer management of his lymphedema and venous ulcers. Venous reflux studies have been performed and unfortunately, the reflux is deep and therefore not amenable to laser  ablation. ABIs in clinic today were noncompressible, but he had excellent Doppler signals. He has a small wound on his left anterior tibial surface with a bit of eschar present. He has another small wound on his right anterior tibial surface that is quite clean. He has a larger superficial ulcer on his right posterior calf that has a small amount of slough present. 04/07/2022: The only remaining open wounds are a small superficial lesion on his right posterior calf and a small ulcer on his left anterior tibial surface. They are both clean without any slough or eschar accumulation. Edema control is good. Electronic Signature(s) Signed: 04/07/2022 2:38:13 PM By: Fredirick Maudlin MD FACS Entered By: Fredirick Maudlin on 04/07/2022 14:38:13 Physical Exam Details -------------------------------------------------------------------------------- [name] ([id number]) [phone].pdf Page 2 of 9 Patient Name: Date of Service: [name] 04/07/2022 2:00 PM Medical Record Number: [id number] Patient Account Number: [id number] Date of Birth/Sex: Treating RN: 1935/11/11 (87 y.o. M) Primary Care Provider: Carol Ada Other Clinician: Referring Provider: Treating Provider/Extender: Prudencio Burly, Candace Weeks in Treatment: 1 Constitutional . Slightly bradycardic. . . no acute distress. Respiratory Normal work of breathing on room air. Notes 04/07/2022: The only remaining open wounds are a small superficial lesion on his right posterior calf and a small ulcer on his left anterior tibial surface. They are both clean without any slough or eschar accumulation. Edema control is good. Electronic Signature(s) Signed: 04/07/2022 2:39:37 PM By: Fredirick Maudlin MD FACS Entered By: Fredirick Maudlin on 04/07/2022 14:39:37 -------------------------------------------------------------------------------- Physician Orders Details Patient Name: Date of Service: [name] Liberty, Herny [name]. 04/07/2022 2:00 PM Medical Record Number: [id number] Patient Account Number: [id number] Date of Birth/Sex: Treating RN: May 28, 1935 (87 y.o. Janyth Contes Primary Care Provider: Carol Ada Other Clinician: Referring Provider: Treating Provider/Extender: Lianne Bushy Weeks in Treatment: 1 Verbal / Phone Orders: No Diagnosis Coding ICD-10 Coding Code Description L97.819 Non-pressure chronic ulcer of other part of right lower leg with unspecified severity L97.829  Non-pressure chronic ulcer of other part of left lower leg with unspecified severity I89.0 Lymphedema, not elsewhere classified I87.2 Venous insufficiency (chronic) (peripheral) I27.20 Pulmonary hypertension, unspecified B35.32 Chronic diastolic (congestive) heart failure N18.30 Chronic kidney disease, stage 3 unspecified Z79.01 Long term (current) use of anticoagulants Follow-up Appointments ppointment in 1 week. - Dr. Celine Ahr RM 1 Return A Anesthetic (In clinic) Topical Lidocaine 4% applied to wound bed Bathing/ Shower/ Hygiene May shower with protection but do not get wound dressing(s) wet. Protect dressing(s) with water repellant cover (for example, large plastic bag) or a cast cover and may then take shower. - may purchase cast protector at [facility], medical supply store Edema Control - Lymphedema / SCD / Other Bilateral Lower Extremities Lymphedema Pumps. Use Lymphedema pumps on leg(s) 2-3 times a day for 45-60 minutes. If wearing any wraps or hose, do not remove them. Continue exercising as instructed. - when available Elevate legs to the level of the heart or above for 30 minutes daily and/or when sitting for 3-4 times a day throughout the day. - whenever sitting Avoid standing for long periods of time. Exercise regularly Wound Treatment Wound #2 - Lower Leg Wound Laterality: Right, Posterior [name] ([id number]) [id number].pdf  Page 3 of 9 Peri-Wound Care: Sween Lotion (Moisturizing lotion) 1 x Per Week/30 Days Discharge Instructions: Apply moisturizing lotion as directed Prim Dressing: Sorbalgon AG Dressing 2x2 (in/in) 1 x Per Week/30 Days ary Discharge Instructions: Apply to wound bed as instructed Secondary Dressing: Woven Gauze Sponge, Non-Sterile 4x4 in 1 x Per Week/30 Days Discharge Instructions: Apply over primary dressing as directed. Compression Wrap: FourPress (4 layer compression wrap) 1 x Per Week/30 Days Discharge Instructions: Apply four layer compression as directed. May also use Miliken CoFlex 2 layer compression system as alternative. Wound #4 - Lower Leg Wound Laterality: Left, Anterior Peri-Wound Care: Sween Lotion (Moisturizing lotion) 1 x Per Week/30 Days Discharge Instructions: Apply moisturizing lotion as directed Prim Dressing: Sorbalgon AG Dressing 2x2 (in/in) 1 x Per Week/30 Days ary Discharge Instructions: Apply to wound bed as instructed Secondary Dressing: Woven Gauze Sponge, Non-Sterile 4x4 in 1 x Per Week/30 Days Discharge Instructions: Apply over primary dressing as directed. Compression Wrap: FourPress (4 layer compression wrap) 1 x Per Week/30 Days Discharge Instructions: Apply four layer compression as directed. May also use Miliken CoFlex 2 layer compression system as alternative. Patient Medications llergies: NSAIDS (Non-Steroidal Anti-Inflammatory Drug) A Notifications Medication Indication Start End 04/07/2022 lidocaine DOSE topical 4 % cream - cream topical Electronic Signature(s) Signed: 04/07/2022 3:13:47 PM By: Fredirick Maudlin MD FACS Entered By: Fredirick Maudlin on 04/07/2022 14:39:49 -------------------------------------------------------------------------------- Problem List Details Patient Name: Date of Service: [name] Liberty, Kawon [name]. 04/07/2022 2:00 PM Medical Record Number: [id number] Patient Account Number: [id number] Date of Birth/Sex: Treating RN: 1935/09/28  (87 y.o. M) Primary Care Provider: Carol Ada Other Clinician: Referring Provider: Treating Provider/Extender: Lianne Bushy Weeks in Treatment: 1 Active Problems ICD-10 Encounter Code Description Active Date MDM Diagnosis L97.819 Non-pressure chronic ulcer of other part of right lower leg with unspecified 03/30/2022 No Yes severity L97.829 Non-pressure chronic ulcer of other part of left lower leg with unspecified 03/30/2022 No Yes severity I89.0 Lymphedema, not elsewhere classified 03/30/2022 No Yes Jehu, Mccauslin Kemon [name] ([id number]) [phone] Page 4 of 9 I87.2 Venous insufficiency (chronic) (peripheral) 03/30/2022 No Yes I27.20 Pulmonary hypertension, unspecified 03/30/2022 No Yes T46.56 Chronic diastolic (congestive) heart failure 03/30/2022 No Yes N18.30 Chronic kidney disease, stage 3 unspecified 03/30/2022 No Yes Z79.01 Long term (current) use  of anticoagulants 03/30/2022 No Yes Inactive Problems Resolved Problems Electronic Signature(s) Signed: 04/07/2022 2:37:01 PM By: Fredirick Maudlin MD FACS Entered By: Fredirick Maudlin on 04/07/2022 14:37:01 -------------------------------------------------------------------------------- Progress Note Details Patient Name: Date of Service: [name] Liberty, Ladislao [name]. 04/07/2022 2:00 PM Medical Record Number: [id number] Patient Account Number: [id number] Date of Birth/Sex: Treating RN: Oct 15, 1935 (87 y.o. M) Primary Care Provider: Carol Ada Other Clinician: Referring Provider: Treating Provider/Extender: Lianne Bushy Weeks in Treatment: 1 Subjective Chief Complaint Information obtained from Patient Patient presents for treatment of open ulcers due to venous insufficiency and lymphedema History of Present Illness (HPI) ADMISSION This is an 87 year old man with a past medical history significant for congestive heart failure, CKD stage III, Parkinson's disease, venous  insufficiency, pulmonary hypertension, chronic A-fib/a flutter on Eliquis, coronary artery disease, and lymphedema. He has been followed in the vascular surgery clinic by Dr. Scot Dock. Per Dr. Nicole Cella last note, lymphedema pumps have been ordered, but he has not yet received them. He referred the patient to the wound care clinic for closer management of his lymphedema and venous ulcers. Venous reflux studies have been performed and unfortunately, the reflux is deep and therefore not amenable to laser ablation. ABIs in clinic today were noncompressible, but he had excellent Doppler signals. He has a small wound on his left anterior tibial surface with a bit of eschar present. He has another small wound on his right anterior tibial surface that is quite clean. He has a larger superficial ulcer on his right posterior calf that has a small amount of slough present. 04/07/2022: The only remaining open wounds are a small superficial lesion on his right posterior calf and a small ulcer on his left anterior tibial surface. They are both clean without any slough or eschar accumulation. Edema control is good. Patient History Information obtained from Patient, Caregiver, Chart. Family History Heart Disease - Father, Stroke, No family history of Cancer, Diabetes, Hereditary Spherocytosis, Hypertension, Kidney Disease, Lung Disease, Seizures, Thyroid Problems, Tuberculosis. Social History Never smoker, Marital Status - Married, Alcohol Use - Daily - 2 cocktails per day, Drug Use - No History, Caffeine Use - Daily. FIDEL, CAGGIANO [name] ([id number]) [id number] Page 5 of 9 Medical History Eyes Patient has history of Cataracts - bil extractions Denies history of Glaucoma, Optic Neuritis Hematologic/Lymphatic Patient has history of Lymphedema - lower legs Cardiovascular Patient has history of Arrhythmia - afib, Congestive Heart Failure, Coronary Artery Disease,  Hypertension Endocrine Denies history of Type I Diabetes, Type II Diabetes Genitourinary Denies history of End Stage Renal Disease Integumentary (Skin) Denies history of History of Burn Musculoskeletal Patient has history of Osteoarthritis - both knees Denies history of Gout, Rheumatoid Arthritis, Osteomyelitis Oncologic Patient has history of Received Radiation - seed implants Denies history of Received Chemotherapy Psychiatric Patient has history of Confinement Anxiety Denies history of Anorexia/bulimia Hospitalization/Surgery History - left femur IM nail. - right shoulder melanoma excision. - cardioversion. - hemorrhoid surgery. - cardiac anfioplasty. - coronary atherectomy. - bil cataract extraction. - insertion prostate radiation seed. - lap cholecystectomy. - inguinal hernia repair. Medical A Surgical History Notes nd Respiratory elevated hemidiaphragm Cardiovascular pulmonary hypertension, mitral valve regurgitation, dilated cardiomyopathy Genitourinary CKD stage 3 Neurologic parkinson's Oncologic skin CA removed, prostate CA Objective Constitutional Slightly bradycardic. no acute distress. Vitals Time Taken: 2:17 PM, Height: 71 in, Weight: 198 lbs, BMI: 27.6, Temperature: 97.6 F, Pulse: 57 bpm, Respiratory Rate: 18 breaths/min, Blood Pressure: 137/58 mmHg. Respiratory Normal work of breathing on room air. General Notes: 04/07/2022: The  only remaining open wounds are a small superficial lesion on his right posterior calf and a small ulcer on his left anterior tibial surface. They are both clean without any slough or eschar accumulation. Edema control is good. Integumentary (Hair, Skin) Wound #1 status is Open. Original cause of wound was Gradually Appeared. The date acquired was: 01/17/2022. The wound has been in treatment 1 weeks. The wound is located on the Right,Anterior Lower Leg. The wound measures 0cm length x 0cm width x 0cm depth; 0cm^2 area and 0cm^3 volume.  There is no tunneling or undermining noted. There is a none present amount of drainage noted. The wound margin is indistinct and nonvisible. There is no granulation within the wound bed. There is no necrotic tissue within the wound bed. The periwound skin appearance had no abnormalities noted for texture. The periwound skin appearance exhibited: Dry/Scaly, Hemosiderin Staining. Periwound temperature was noted as No Abnormality. Wound #2 status is Open. Original cause of wound was Blister. The date acquired was: 01/17/2022. The wound has been in treatment 1 weeks. The wound is located on the Right,Posterior Lower Leg. The wound measures 1cm length x 0.5cm width x 0.1cm depth; 0.393cm^2 area and 0.039cm^3 volume. There is Fat Layer (Subcutaneous Tissue) exposed. There is no tunneling or undermining noted. There is a medium amount of serosanguineous drainage noted. The wound margin is flat and intact. There is large (67-100%) pink granulation within the wound bed. There is a small (1-33%) amount of necrotic tissue within the wound bed including Adherent Slough. The periwound skin appearance had no abnormalities noted for texture. The periwound skin appearance exhibited: Dry/Scaly, Hemosiderin Staining. Periwound temperature was noted as No Abnormality. Wound #3 status is Open. Original cause of wound was Blister. The date acquired was: 01/17/2022. The wound has been in treatment 1 weeks. The wound is located on the Right,Distal,Posterior Lower Leg. The wound measures 0cm length x 0cm width x 0cm depth; 0cm^2 area and 0cm^3 volume. There is Fat Layer (Subcutaneous Tissue) exposed. There is no tunneling or undermining noted. There is a none present amount of drainage noted. The wound margin is flat and intact. There is no granulation within the wound bed. There is no necrotic tissue within the wound bed. The periwound skin appearance had no abnormalities noted for texture. The periwound skin appearance  exhibited: Dry/Scaly, Hemosiderin Staining. Periwound temperature was noted as No Abnormality. Wound #4 status is Open. Original cause of wound was Gradually Appeared. The date acquired was: 01/17/2022. The wound has been in treatment 1 weeks. The wound is located on the Left,Anterior Lower Leg. The wound measures 0.5cm length x 0.5cm width x 0.1cm depth; 0.196cm^2 area and 0.02cm^3 volume. There is Fat Layer (Subcutaneous Tissue) exposed. There is no tunneling or undermining noted. There is a medium amount of serosanguineous drainage noted. The wound margin is flat and intact. There is large (67-100%) red granulation within the wound bed. There is a small (1-33%) amount of necrotic tissue within the wound bed including Eschar. The periwound skin appearance had no abnormalities noted for texture. The periwound skin appearance exhibited: SYLUS, STGERMAIN [name] ([id number]) [id number] Page 6 of 9 Hemosiderin Staining. Periwound temperature was noted as No Abnormality. Assessment Active Problems ICD-10 Non-pressure chronic ulcer of other part of right lower leg with unspecified severity Non-pressure chronic ulcer of other part of left lower leg with unspecified severity Lymphedema, not elsewhere classified Venous insufficiency (chronic) (peripheral) Pulmonary hypertension, unspecified Chronic diastolic (congestive) heart failure Chronic kidney disease, stage 3 unspecified  Long term (current) use of anticoagulants Procedures Wound #1 Pre-procedure diagnosis of Wound #1 is a Lymphedema located on the Right,Anterior Lower Leg . There was a Four Layer Compression Therapy Procedure by Adline Peals, RN. Post procedure Diagnosis Wound #1: Same as Pre-Procedure Wound #2 Pre-procedure diagnosis of Wound #2 is a Lymphedema located on the Right,Posterior Lower Leg . There was a Four Layer Compression Therapy Procedure by Adline Peals, RN. Post procedure  Diagnosis Wound #2: Same as Pre-Procedure Plan Follow-up Appointments: Return Appointment in 1 week. - Dr. Celine Ahr RM 1 Anesthetic: (In clinic) Topical Lidocaine 4% applied to wound bed Bathing/ Shower/ Hygiene: May shower with protection but do not get wound dressing(s) wet. Protect dressing(s) with water repellant cover (for example, large plastic bag) or a cast cover and may then take shower. - may purchase cast protector at [facility], medical supply store Edema Control - Lymphedema / SCD / Other: Lymphedema Pumps. Use Lymphedema pumps on leg(s) 2-3 times a day for 45-60 minutes. If wearing any wraps or hose, do not remove them. Continue exercising as instructed. - when available Elevate legs to the level of the heart or above for 30 minutes daily and/or when sitting for 3-4 times a day throughout the day. - whenever sitting Avoid standing for long periods of time. Exercise regularly The following medication(s) was prescribed: lidocaine topical 4 % cream cream topical was prescribed at facility WOUND #2: - Lower Leg Wound Laterality: Right, Posterior Peri-Wound Care: Sween Lotion (Moisturizing lotion) 1 x Per Week/30 Days Discharge Instructions: Apply moisturizing lotion as directed Prim Dressing: Sorbalgon AG Dressing 2x2 (in/in) 1 x Per Week/30 Days ary Discharge Instructions: Apply to wound bed as instructed Secondary Dressing: Woven Gauze Sponge, Non-Sterile 4x4 in 1 x Per Week/30 Days Discharge Instructions: Apply over primary dressing as directed. Com pression Wrap: FourPress (4 layer compression wrap) 1 x Per Week/30 Days Discharge Instructions: Apply four layer compression as directed. May also use Miliken CoFlex 2 layer compression system as alternative. WOUND #4: - Lower Leg Wound Laterality: Left, Anterior Peri-Wound Care: Sween Lotion (Moisturizing lotion) 1 x Per Week/30 Days Discharge Instructions: Apply moisturizing lotion as directed Prim Dressing: Sorbalgon AG  Dressing 2x2 (in/in) 1 x Per Week/30 Days ary Discharge Instructions: Apply to wound bed as instructed Secondary Dressing: Woven Gauze Sponge, Non-Sterile 4x4 in 1 x Per Week/30 Days Discharge Instructions: Apply over primary dressing as directed. Com pression Wrap: FourPress (4 layer compression wrap) 1 x Per Week/30 Days Discharge Instructions: Apply four layer compression as directed. May also use Miliken CoFlex 2 layer compression system as alternative. 04/07/2022: The only remaining open wounds are a small superficial lesion on his right posterior calf and a small ulcer on his left anterior tibial surface. They are both clean without any slough or eschar accumulation. Edema control is good. No debridement was necessary today. We will continue silver alginate with bilateral 4-layer compression. Hopefully his wounds will be completely healed at his visit next week. LAURI, TILL [name] ([id number]) [id number] Page 7 of 9 Electronic Signature(s) Signed: 04/07/2022 2:40:45 PM By: Fredirick Maudlin MD FACS Entered By: Fredirick Maudlin on 04/07/2022 14:40:44 -------------------------------------------------------------------------------- HxROS Details Patient Name: Date of Service: [name], Travares [name]. 04/07/2022 2:00 PM Medical Record Number: [id number] Patient Account Number: [id number] Date of Birth/Sex: Treating RN: [birth year] (87 y.o. M) Primary Care Provider: Carol Ada Other Clinician: Referring Provider: Treating Provider/Extender: Lianne Bushy Weeks in Treatment: 1 Information Obtained From Patient Caregiver Chart Eyes Medical History: Positive for:  Cataracts - bil extractions Negative for: Glaucoma; Optic Neuritis Hematologic/Lymphatic Medical History: Positive for: Lymphedema - lower legs Respiratory Medical History: Past Medical History Notes: elevated hemidiaphragm Cardiovascular Medical History: Positive for: Arrhythmia  - afib; Congestive Heart Failure; Coronary Artery Disease; Hypertension Past Medical History Notes: pulmonary hypertension, mitral valve regurgitation, dilated cardiomyopathy Endocrine Medical History: Negative for: Type I Diabetes; Type II Diabetes Genitourinary Medical History: Negative for: End Stage Renal Disease Past Medical History Notes: CKD stage 3 Integumentary (Skin) Medical History: Negative for: History of Burn Musculoskeletal Medical History: Positive for: Osteoarthritis - both knees Negative for: Gout; Rheumatoid Arthritis; Osteomyelitis Neurologic Medical History: Past Medical History Notes: parkinson's ASAR, EVILSIZER ([id number]) [phone].pdf Page 8 of 9 Oncologic Medical History: Positive for: Received Radiation - seed implants Negative for: Received Chemotherapy Past Medical History Notes: skin CA removed, prostate CA Psychiatric Medical History: Positive for: Confinement Anxiety Negative for: Anorexia/bulimia HBO Extended History Items Eyes: Cataracts Immunizations Pneumococcal Vaccine: Received Pneumococcal Vaccination: Yes Received Pneumococcal Vaccination On or After 60th Birthday: Yes Implantable Devices No devices added Hospitalization / Surgery History Type of Hospitalization/Surgery left femur IM nail right shoulder melanoma excision cardioversion hemorrhoid surgery cardiac anfioplasty coronary atherectomy bil cataract extraction insertion prostate radiation seed lap cholecystectomy inguinal hernia repair Family and Social History Cancer: No; Diabetes: No; Heart Disease: Yes - Father; Hereditary Spherocytosis: No; Hypertension: No; Kidney Disease: No; Lung Disease: No; Seizures: No; Stroke: Yes; Thyroid Problems: No; Tuberculosis: No; Never smoker; Marital Status - Married; Alcohol Use: Daily - 2 cocktails per day; Drug Use: No History; Caffeine Use: Daily; Financial Concerns: No; Food, Clothing or  Shelter Needs: No; Support System Lacking: No; Transportation Concerns: No Electronic Signature(s) Signed: 04/07/2022 3:13:47 PM By: Fredirick Maudlin MD FACS Entered By: Fredirick Maudlin on 04/07/2022 14:39:08 -------------------------------------------------------------------------------- SuperBill Details Patient Name: Date of Service: [name] Liberty, Rockie [name]. 04/07/2022 Medical Record Number: [id number] Patient Account Number: [id number] Date of Birth/Sex: Treating RN: 1935-09-23 (87 y.o. M) Primary Care Provider: Carol Ada Other Clinician: Referring Provider: Treating Provider/Extender: Lianne Bushy Weeks in Treatment: 1 Diagnosis Coding ICD-10 Codes Code Description [phone] Non-pressure chronic ulcer of other part of right lower leg with unspecified severity L97.829 Non-pressure chronic ulcer of other part of left lower leg with unspecified severity I89.0 Lymphedema, not elsewhere classified I87.2 Venous insufficiency (chronic) (peripheral) Zaide, Kardell Tylique [name] ([id number]) [id number] Page 9 of 9 I27.20 Pulmonary hypertension, unspecified J50.09 Chronic diastolic (congestive) heart failure N18.30 Chronic kidney disease, stage 3 unspecified Z79.01 Long term (current) use of anticoagulants Facility Procedures : CPT4: Code [id number] Description: 81 BILATERAL: Application of multi-layer venous compression system; leg (below knee), including ankle and t. 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

## 2022-04-07 NOTE — Progress Notes (Signed)
EMRAN, MOLZAHN E ([id number]) [id number] Page 1 of 12 Visit Report for 04/07/2022 Arrival Information Details Patient Name: Date of Service: [name], [place] 04/07/2022 2:00 PM Medical Record Number: [id number] Patient Account Number: [id number] Date of Birth/Sex: Treating RN: 09/05/1935 (87 y.o. Janyth Contes Primary Care Renwick Asman: Carol Ada Other Clinician: Referring Annalisa Colonna: Treating Maleak Brazzel/Extender: Lianne Bushy Weeks in Treatment: 1 Visit Information History Since Last Visit Added or deleted any medications: No Patient Arrived: Wheel Chair Any new allergies or adverse reactions: No Arrival Time: 14:11 Had a fall or experienced change in No Accompanied By: husband activities of daily living that may affect Transfer Assistance: Manual risk of falls: Patient Requires Transmission-Based Precautions: No Signs or symptoms of abuse/neglect since last visito No Patient Has Alerts: Yes Hospitalized since last visit: No Patient Alerts: Patient on Blood Thinner Implantable device outside of the clinic excluding No Bil ABIs N/C cellular tissue based products placed in the center since last visit: Has Dressing in Place as Prescribed: Yes Has Compression in Place as Prescribed: Yes Pain Present Now: No Electronic Signature(s) Signed: 04/07/2022 3:35:42 PM By: Adline Peals Entered By: Adline Peals on 04/07/2022 14:17:41 -------------------------------------------------------------------------------- Compression Therapy Details Patient Name: Date of Service: [name] Liberty, [name] 04/07/2022 2:00 PM Medical Record Number: [id number] Patient Account Number: [id number] Date of Birth/Sex: Treating RN: [birth year] (87 y.o. Janyth Contes Primary Care Mikaeel Petrow: Carol Ada Other Clinician: Referring Ivie Maese: Treating Parthena Fergeson/Extender: Lianne Bushy Weeks in Treatment: 1 Compression  Therapy Performed for Wound Assessment: Wound #1 Right,Anterior Lower Leg Performed By: Clinician Adline Peals, RN Compression Type: Four Layer Post Procedure Diagnosis Same as Pre-procedure Electronic Signature(s) Signed: 04/07/2022 3:35:42 PM By: Adline Peals Entered By: Adline Peals on 04/07/2022 14:26:16 [name], Stryder E ([id number]) [id number] Page 2 of 12 -------------------------------------------------------------------------------- Compression Therapy Details Patient Name: Date of Service: [name] 04/07/2022 2:00 PM Medical Record Number: [id number] Patient Account Number: [id number] Date of Birth/Sex: Treating RN: Apr 24, 1935 (87 y.o. Janyth Contes Primary Care Lancer Thurner: Carol Ada Other Clinician: Referring Dex Blakely: Treating Ameka Krigbaum/Extender: Lianne Bushy Weeks in Treatment: 1 Compression Therapy Performed for Wound Assessment: Wound #2 Right,Posterior Lower Leg Performed By: Clinician Adline Peals, RN Compression Type: Four Layer Post Procedure Diagnosis Same as Pre-procedure Electronic Signature(s) Signed: 04/07/2022 3:35:42 PM By: Sabas Sous By: Adline Peals on 04/07/2022 14:26:16 -------------------------------------------------------------------------------- Encounter Discharge Information Details Patient Name: Date of Service: [name] Liberty, Keenan E. 04/07/2022 2:00 PM Medical Record Number: [id number] Patient Account Number: [id number] Date of Birth/Sex: Treating RN: Aug 02, 1935 (87 y.o. Janyth Contes Primary Care Torrian Canion: Carol Ada Other Clinician: Referring Lamiah Marmol: Treating Anmol Paschen/Extender: Lianne Bushy Weeks in Treatment: 1 Encounter Discharge Information Items Discharge Condition: Stable Ambulatory Status: Wheelchair Discharge Destination: Home Transportation: Private Auto Accompanied By: wife Schedule  Follow-up Appointment: Yes Clinical Summary of Care: Patient Declined Electronic Signature(s) Signed: 04/07/2022 3:35:42 PM By: Sabas Sous By: Adline Peals on 04/07/2022 14:46:19 -------------------------------------------------------------------------------- Lower Extremity Assessment Details Patient Name: Date of Service: [name] 04/07/2022 2:00 PM Medical Record Number: [id number] Patient Account Number: [id number] Date of Birth/Sex: Treating RN: 1935-09-04 (87 y.o. Janyth Contes Primary Care Kena Limon: Carol Ada Other Clinician: Referring Devontaye Ground: Treating Mackayla Mullins/Extender: Prudencio Burly, Candace Weeks in Treatment: 1 Edema Assessment Assessed: [Left: No] [Right: No] Edema: [Left: Yes] [Right: Yes] Calf Jo, Cerone Jemaine E ([id number]) [id number] Page 3 of 12 Left: Right: Point of Measurement: From Medial Instep 38 cm 40 cm Ankle Left: Right: Point of  Measurement: From Medial Instep 23 cm 24 cm Vascular Assessment Pulses: Dorsalis Pedis Palpable: [Left:Yes] [Right:Yes] Electronic Signature(s) Signed: 04/07/2022 3:35:42 PM By: Adline Peals Entered By: Adline Peals on 04/07/2022 14:19:57 -------------------------------------------------------------------------------- Multi Wound Chart Details Patient Name: Date of Service: [name] Liberty, [name] 04/07/2022 2:00 PM Medical Record Number: [id number] Patient Account Number: [id number] Date of Birth/Sex: Treating RN: Jul 29, 1935 (87 y.o. M) Primary Care Leilyn Frayre: Carol Ada Other Clinician: Referring Merriam Brandner: Treating Tyquon Near/Extender: Prudencio Burly, Candace Weeks in Treatment: 1 Vital Signs Height(in): 71 Pulse(bpm): 63 Weight(lbs): 198 Blood Pressure(mmHg): 137/58 Body Mass Index(BMI): 27.6 Temperature(F): 97.6 Respiratory Rate(breaths/min): 18 [1:Photos: No Photos] [3:No Photos] Right, Anterior Lower Leg Right,  Posterior Lower Leg Right, Distal, Posterior Lower Leg Wound Location: Gradually Appeared Blister Blister Wounding Event: Lymphedema Lymphedema Lymphedema Primary Etiology: Venous Leg Ulcer Venous Leg Ulcer Venous Leg Ulcer Secondary Etiology: Cataracts, Lymphedema, Arrhythmia, Cataracts, Lymphedema, Arrhythmia, Cataracts, Lymphedema, Arrhythmia, Comorbid History: Congestive Heart Failure, Coronary Congestive Heart Failure, Coronary Congestive Heart Failure, Coronary Artery Disease, Hypertension, Artery Disease, Hypertension, Artery Disease, Hypertension, Osteoarthritis, Received Radiation, Osteoarthritis, Received Radiation, Osteoarthritis, Received Radiation, Confinement Anxiety Confinement Anxiety Confinement Anxiety 01/17/2022 01/17/2022 01/17/2022 Date Acquired: '1 1 1 '$ Weeks of Treatment: Open Open Open Wound Status: No No No Wound Recurrence: 0x0x0 1x0.5x0.1 0x0x0 Measurements L x W x D (cm) 0 0.393 0 A (cm) : rea 0 0.039 0 Volume (cm) : 100.00% 88.70% 100.00% % Reduction in Area: 100.00% 88.80% 100.00% % Reduction in Volume: Full Thickness Without Exposed Full Thickness Without Exposed Full Thickness Without Exposed Classification: Support Structures Support Structures Support Structures None Present Medium None Present Exudate Amount: N/A Serosanguineous N/A Exudate Type: N/A red, brown N/A Exudate Color: Indistinct, nonvisible Flat and Intact Flat and Intact Wound MarginZekiel, Torian Dalbert E ([id number]) [id number] Page 4 of 12 None Present (0%) Large (67-100%) None Present (0%) Granulation Amount: N/A Pink N/A Granulation Quality: None Present (0%) Small (1-33%) None Present (0%) Necrotic Amount: N/A Adherent Slough N/A Necrotic Tissue: Fascia: No Fat Layer (Subcutaneous Tissue): Yes Fat Layer (Subcutaneous Tissue): Yes Exposed Structures: Fat Layer (Subcutaneous Tissue): No Fascia: No Fascia: No Tendon: No Tendon:  No Tendon: No Muscle: No Muscle: No Muscle: No Joint: No Joint: No Joint: No Bone: No Bone: No Bone: No Large (67-100%) Large (67-100%) Large (67-100%) Epithelialization: No Abnormalities Noted No Abnormalities Noted No Abnormalities Noted Periwound Skin Texture: Dry/Scaly: Yes Dry/Scaly: Yes Dry/Scaly: Yes Periwound Skin Moisture: Hemosiderin Staining: Yes Hemosiderin Staining: Yes Hemosiderin Staining: Yes Periwound Skin Color: No Abnormality No Abnormality No Abnormality Temperature: Compression Therapy Compression Therapy N/A Procedures Performed: Wound Number: 4 N/A N/A Photos: N/A N/A Left, Anterior Lower Leg N/A N/A Wound Location: Gradually Appeared N/A N/A Wounding Event: Lymphedema N/A N/A Primary Etiology: Venous Leg Ulcer N/A N/A Secondary Etiology: Cataracts, Lymphedema, Arrhythmia, N/A N/A Comorbid History: Congestive Heart Failure, Coronary Artery Disease, Hypertension, Osteoarthritis, Received Radiation, Confinement Anxiety 01/17/2022 N/A N/A Date Acquired: 1 N/A N/A Weeks of Treatment: Open N/A N/A Wound Status: No N/A N/A Wound Recurrence: 0.5x0.5x0.1 N/A N/A Measurements L x W x D (cm) 0.196 N/A N/A A (cm) : rea 0.02 N/A N/A Volume (cm) : -66.10% N/A N/A % Reduction in Area: -66.70% N/A N/A % Reduction in Volume: Full Thickness Without Exposed N/A N/A Classification: Support Structures Medium N/A N/A Exudate A mount: Serosanguineous N/A N/A Exudate Type: red, brown N/A N/A Exudate Color: Flat and Intact N/A N/A Wound Margin: Large (67-100%) N/A N/A Granulation Amount: Red N/A N/A Granulation Quality: Small (1-33%) N/A  N/A Necrotic Amount: Eschar N/A N/A Necrotic Tissue: Fat Layer (Subcutaneous Tissue): Yes N/A N/A Exposed Structures: Fascia: No Tendon: No Muscle: No Joint: No Bone: No Large (67-100%) N/A N/A Epithelialization: No Abnormalities Noted N/A N/A Periwound Skin Texture: Dry/Scaly: Yes N/A  N/A Periwound Skin Moisture: Hemosiderin Staining: Yes N/A N/A Periwound Skin Color: No Abnormality N/A N/A Temperature: N/A N/A N/A Procedures Performed: Treatment Notes Electronic Signature(s) Signed: 04/07/2022 2:37:10 PM By: Fredirick Maudlin MD FACS Entered By: Fredirick Maudlin on 04/07/2022 14:37:10 [name] ([id number]) [id number] Page 5 of 12 -------------------------------------------------------------------------------- Multi-Disciplinary Care Plan Details Patient Name: Date of Service: DUFF, POZZI 04/07/2022 2:00 PM Medical Record Number: [id number] Patient Account Number: [id number] Date of Birth/Sex: Treating RN: 1935-03-05 (87 y.o. Janyth Contes Primary Care Alano Blasco: Carol Ada Other Clinician: Referring Alayla Dethlefs: Treating Amor Packard/Extender: Lianne Bushy Weeks in Treatment: 1 Multidisciplinary Care Plan reviewed with physician Active Inactive Abuse / Safety / Falls / Self Care Management Nursing Diagnoses: History of Falls Potential for falls Goals: Patient/caregiver will verbalize/demonstrate measures taken to prevent injury and/or falls Date Initiated: 03/30/2022 Target Resolution Date: 05/27/2022 Goal Status: Active Interventions: Assess fall risk on admission and as needed Assess impairment of mobility on admission and as needed per policy Notes: Venous Leg Ulcer Nursing Diagnoses: Actual venous Insuffiency (use after diagnosis is confirmed) Knowledge deficit related to disease process and management Potential for venous Insuffiency (use before diagnosis confirmed) Goals: Patient will maintain optimal edema control Date Initiated: 03/30/2022 Target Resolution Date: 05/27/2022 Goal Status: Active Patient/caregiver will verbalize understanding of disease process and disease management Date Initiated: 03/30/2022 Target Resolution Date: 05/27/2022 Goal Status:  Active Interventions: Assess peripheral edema status every visit. Compression as ordered Provide education on venous insufficiency Treatment Activities: Therapeutic compression applied : 03/30/2022 Notes: Wound/Skin Impairment Nursing Diagnoses: Impaired tissue integrity Knowledge deficit related to ulceration/compromised skin integrity Goals: Patient/caregiver will verbalize understanding of skin care regimen Date Initiated: 03/30/2022 Date Inactivated: 04/07/2022 Target Resolution Date: 04/27/2022 Goal Status: Met Ulcer/skin breakdown will have a volume reduction of 30% by week 4 Date Initiated: 03/30/2022 Target Resolution Date: 04/27/2022 Goal Status: Active Interventions: CABOT, CROMARTIE ([id number]) [phone].pdf Page 6 of 12 Assess patient/caregiver ability to obtain necessary supplies Assess patient/caregiver ability to perform ulcer/skin care regimen upon admission and as needed Assess ulceration(s) every visit Provide education on ulcer and skin care Treatment Activities: Skin care regimen initiated : 03/30/2022 Topical wound management initiated : 03/30/2022 Notes: Electronic Signature(s) Signed: 04/07/2022 3:35:42 PM By: Adline Peals Entered By: Adline Peals on 04/07/2022 14:27:38 -------------------------------------------------------------------------------- Pain Assessment Details Patient Name: Date of Service: [name] Liberty, Selestino E. 04/07/2022 2:00 PM Medical Record Number: [id number] Patient Account Number: [id number] Date of Birth/Sex: Treating RN: 10/24/1935 (87 y.o. Janyth Contes Primary Care Omesha Bowerman: Carol Ada Other Clinician: Referring Elijio Staples: Treating Bradden Tadros/Extender: Lianne Bushy Weeks in Treatment: 1 Active Problems Location of Pain Severity and Description of Pain Patient Has Paino No Site Locations Rate the pain. Current Pain Level: 0 Pain Management and Medication Current Pain  Management: Electronic Signature(s) Signed: 04/07/2022 3:35:42 PM By: Adline Peals Entered By: Adline Peals on 04/07/2022 14:18:05 -------------------------------------------------------------------------------- Patient/Caregiver Education Details Patient Name: Date of Service: [name] Liberty, Shea E. 2/8/2024andnbsp2:00 PM [name] ([id number]) [id number] Page 7 of 12 Medical Record Number: [id number] Patient Account Number: [id number] Date of Birth/Gender: Treating RN: May 07, 1935 (87 y.o. Janyth Contes Primary Care Physician: Carol Ada Other Clinician: Referring Physician: Treating Physician/Extender: Prudencio Burly, Candace Weeks in Treatment: 1  Education Assessment Education Provided To: Patient Education Topics Provided Wound/Skin Impairment: Methods: Explain/Verbal Responses: Reinforcements needed, State content correctly Electronic Signature(s) Signed: 04/07/2022 3:35:42 PM By: Adline Peals Entered By: Adline Peals on 04/07/2022 14:27:47 -------------------------------------------------------------------------------- Wound Assessment Details Patient Name: Date of Service: [name] Liberty, Skanda E. 04/07/2022 2:00 PM Medical Record Number: [id number] Patient Account Number: [id number] Date of Birth/Sex: Treating RN: November 01, 1935 (87 y.o. Janyth Contes Primary Care Daneya Hartgrove: Carol Ada Other Clinician: Referring Shavana Calder: Treating Zeynep Fantroy/Extender: Lianne Bushy Weeks in Treatment: 1 Wound Status Wound Number: 1 Primary Lymphedema Etiology: Wound Location: Right, Anterior Lower Leg Secondary Venous Leg Ulcer Wounding Event: Gradually Appeared Etiology: Date Acquired: 01/17/2022 Wound Open Weeks Of Treatment: 1 Status: Clustered Wound: No Comorbid Cataracts, Lymphedema, Arrhythmia, Congestive Heart Failure, History: Coronary Artery Disease, Hypertension,  Osteoarthritis, Received Radiation, Confinement Anxiety Wound Measurements Length: (cm) Width: (cm) Depth: (cm) Area: (cm) Volume: (cm) 0 % Reduction in Area: 100% 0 % Reduction in Volume: 100% 0 Epithelialization: Large (67-100%) 0 Tunneling: No 0 Undermining: No Wound Description Classification: Full Thickness Without Exposed Support Structures Wound Margin: Indistinct, nonvisible Exudate Amount: None Present Foul Odor After Cleansing: No Slough/Fibrino No Wound Bed Granulation Amount: None Present (0%) Exposed Structure Necrotic Amount: None Present (0%) Fascia Exposed: No Fat Layer (Subcutaneous Tissue) Exposed: No Tendon Exposed: No Muscle Exposed: No Joint Exposed: No Bone Exposed: No Periwound Skin Texture Texture Color Mangual, Dontrey E ([id number]) [id number] Page 8 of 12 No Abnormalities Noted: Yes No Abnormalities Noted: No Hemosiderin Staining: Yes Moisture No Abnormalities Noted: No Temperature / Pain Dry / Scaly: Yes Temperature: No Abnormality Electronic Signature(s) Signed: 04/07/2022 3:35:42 PM By: Adline Peals Entered By: Adline Peals on 04/07/2022 14:21:23 -------------------------------------------------------------------------------- Wound Assessment Details Patient Name: Date of Service: [name] Liberty, Chrles E. 04/07/2022 2:00 PM Medical Record Number: [id number] Patient Account Number: [id number] Date of Birth/Sex: Treating RN: 08-Dec-1935 (87 y.o. Janyth Contes Primary Care Shaul Trautman: Carol Ada Other Clinician: Referring Jacque Byron: Treating Zayah Keilman/Extender: Lianne Bushy Weeks in Treatment: 1 Wound Status Wound Number: 2 Primary Lymphedema Etiology: Wound Location: Right, Posterior Lower Leg Secondary Venous Leg Ulcer Wounding Event: Blister Etiology: Date Acquired: 01/17/2022 Wound Open Weeks Of Treatment: 1 Status: Clustered Wound: No Comorbid Cataracts,  Lymphedema, Arrhythmia, Congestive Heart Failure, History: Coronary Artery Disease, Hypertension, Osteoarthritis, Received Radiation, Confinement Anxiety Photos Wound Measurements Length: (cm) 1 Width: (cm) 0.5 Depth: (cm) 0.1 Area: (cm) 0.393 Volume: (cm) 0.039 % Reduction in Area: 88.7% % Reduction in Volume: 88.8% Epithelialization: Large (67-100%) Tunneling: No Undermining: No Wound Description Classification: Full Thickness Without Exposed Suppor Wound Margin: Flat and Intact Exudate Amount: Medium Exudate Type: Serosanguineous Exudate Color: red, brown t Structures Foul Odor After Cleansing: No Slough/Fibrino Yes Wound Bed Granulation Amount: Large (67-100%) Exposed Structure Granulation Quality: Pink Fascia Exposed: No Necrotic Amount: Small (1-33%) Fat Layer (Subcutaneous Tissue) Exposed: Yes Necrotic Quality: Adherent Slough Tendon Exposed: No Muscle Exposed: No Joint Exposed: No Bone Exposed: No Cue, Hasheem E ([id number]) [id number] Page 9 of 12 Periwound Skin Texture Texture Color No Abnormalities Noted: Yes No Abnormalities Noted: No Hemosiderin Staining: Yes Moisture No Abnormalities Noted: No Temperature / Pain Dry / Scaly: Yes Temperature: No Abnormality Treatment Notes Wound #2 (Lower Leg) Wound Laterality: Right, Posterior Cleanser Peri-Wound Care Sween Lotion (Moisturizing lotion) Discharge Instruction: Apply moisturizing lotion as directed Topical Primary Dressing Sorbalgon AG Dressing 2x2 (in/in) Discharge Instruction: Apply to wound bed as instructed Secondary Dressing Woven Gauze Sponge, Non-Sterile 4x4 in Discharge Instruction: Apply over primary dressing  as directed. Secured With Compression Wrap FourPress (4 layer compression wrap) Discharge Instruction: Apply four layer compression as directed. May also use Miliken CoFlex 2 layer compression system as alternative. Compression  Stockings Add-Ons Electronic Signature(s) Signed: 04/07/2022 3:35:42 PM By: Adline Peals Signed: 04/07/2022 3:36:20 PM By: Blanche East RN Entered By: Blanche East on 04/07/2022 14:23:09 -------------------------------------------------------------------------------- Wound Assessment Details Patient Name: Date of Service: [name] Liberty, Chavez E. 04/07/2022 2:00 PM Medical Record Number: [id number] Patient Account Number: [id number] Date of Birth/Sex: Treating RN: 1936-02-15 (87 y.o. Janyth Contes Primary Care Kennedy Brines: Carol Ada Other Clinician: Referring Berkleigh Beckles: Treating Brighid Koch/Extender: Lianne Bushy Weeks in Treatment: 1 Wound Status Wound Number: 3 Primary Lymphedema Etiology: Wound Location: Right, Distal, Posterior Lower Leg Secondary Venous Leg Ulcer Wounding Event: Blister Etiology: Date Acquired: 01/17/2022 Wound Open Weeks Of Treatment: 1 Status: Clustered Wound: No Comorbid Cataracts, Lymphedema, Arrhythmia, Congestive Heart Failure, History: Coronary Artery Disease, Hypertension, Osteoarthritis, Received Radiation, Confinement Anxiety Wound Measurements Length: (cm) Width: (cm) Depth: (cm) Area: (cm) Volume: (cm) Bostock, Virgie E ([id number]) 0 % Reduction in Area: 100% 0 % Reduction in Volume: 100% 0 Epithelialization: Large (67-100%) 0 Tunneling: No 0 Undermining: No [id number] Page 10 of 12 Wound Description Classification: Full Thickness Without Exposed Support Structures Wound Margin: Flat and Intact Exudate Amount: None Present Foul Odor After Cleansing: No Slough/Fibrino No Wound Bed Granulation Amount: None Present (0%) Exposed Structure Necrotic Amount: None Present (0%) Fascia Exposed: No Fat Layer (Subcutaneous Tissue) Exposed: Yes Tendon Exposed: No Muscle Exposed: No Joint Exposed: No Bone Exposed: No Periwound Skin Texture Texture Color No Abnormalities Noted:  Yes No Abnormalities Noted: No Hemosiderin Staining: Yes Moisture No Abnormalities Noted: No Temperature / Pain Dry / Scaly: Yes Temperature: No Abnormality Electronic Signature(s) Signed: 04/07/2022 3:35:42 PM By: Adline Peals Entered By: Adline Peals on 04/07/2022 14:22:53 -------------------------------------------------------------------------------- Wound Assessment Details Patient Name: Date of Service: [name] Liberty, [name] 04/07/2022 2:00 PM Medical Record Number: [id number] Patient Account Number: [id number] Date of Birth/Sex: Treating RN: 08/09/1935 (87 y.o. Janyth Contes Primary Care Jalena Vanderlinden: Carol Ada Other Clinician: Referring Akeila Lana: Treating Maicol Bowland/Extender: Lianne Bushy Weeks in Treatment: 1 Wound Status Wound Number: 4 Primary Lymphedema Etiology: Wound Location: Left, Anterior Lower Leg Secondary Venous Leg Ulcer Wounding Event: Gradually Appeared Etiology: Date Acquired: 01/17/2022 Wound Open Weeks Of Treatment: 1 Status: Clustered Wound: No Comorbid Cataracts, Lymphedema, Arrhythmia, Congestive Heart Failure, History: Coronary Artery Disease, Hypertension, Osteoarthritis, Received Radiation, Confinement Anxiety Photos Wound Measurements Length: (cm) 0.5 Width: (cm) 0.5 Depth: (cm) 0.1 Area: (cm) 0.196 Volume: (cm) 0.02 Sircy, Theron E ([id number]) % Reduction in Area: -66.1% % Reduction in Volume: -66.7% Epithelialization: Large (67-100%) Tunneling: No Undermining: No [id number] Page 11 of 12 Wound Description Classification: Full Thickness Without Exposed Suppor Wound Margin: Flat and Intact Exudate Amount: Medium Exudate Type: Serosanguineous Exudate Color: red, brown t Structures Foul Odor After Cleansing: No Slough/Fibrino Yes Wound Bed Granulation Amount: Large (67-100%) Exposed Structure Granulation Quality: Red Fascia Exposed: No Necrotic Amount: Small  (1-33%) Fat Layer (Subcutaneous Tissue) Exposed: Yes Necrotic Quality: Eschar Tendon Exposed: No Muscle Exposed: No Joint Exposed: No Bone Exposed: No Periwound Skin Texture Texture Color No Abnormalities Noted: Yes No Abnormalities Noted: No Hemosiderin Staining: Yes Moisture No Abnormalities Noted: No Temperature / Pain Dry / Scaly: Yes Temperature: No Abnormality Treatment Notes Wound #4 (Lower Leg) Wound Laterality: Left, Anterior Cleanser Peri-Wound Care Sween Lotion (Moisturizing lotion) Discharge Instruction: Apply moisturizing lotion as directed Topical Primary Dressing Sorbalgon AG Dressing  2x2 (in/in) Discharge Instruction: Apply to wound bed as instructed Secondary Dressing Woven Gauze Sponge, Non-Sterile 4x4 in Discharge Instruction: Apply over primary dressing as directed. Secured With Compression Wrap FourPress (4 layer compression wrap) Discharge Instruction: Apply four layer compression as directed. May also use Miliken CoFlex 2 layer compression system as alternative. Compression Stockings Add-Ons Electronic Signature(s) Signed: 04/07/2022 3:35:42 PM By: Adline Peals Signed: 04/07/2022 3:36:20 PM By: Blanche East RN Entered By: Blanche East on 04/07/2022 14:23:39 -------------------------------------------------------------------------------- Vitals Details Patient Name: Date of Service: [name] Liberty, Yarel E. 04/07/2022 2:00 PM Medical Record Number: [id number] Patient Account Number: [id number] Date of Birth/Sex: Treating RN: 07/24/1935 (87 y.o. Janyth Contes Primary Care Latesa Fratto: Carol Ada Other Clinician: Referring Anya Murphey: Treating Mariellen Blaney/Extender: Lianne Bushy Weeks in Treatment: 1 ASHDEN, SONNENBERG E ([id number]) [id number] Page 12 of 12 Vital Signs Time Taken: 14:17 Temperature (F): 97.6 Height (in): 71 Pulse (bpm): 57 Weight (lbs): 198 Respiratory Rate (breaths/min):  18 Body Mass Index (BMI): 27.6 Blood Pressure (mmHg): 137/58 Reference Range: 80 - 120 mg / dl Electronic Signature(s) Signed: 04/07/2022 3:35:42 PM By: Adline Peals Entered By: Adline Peals on 04/07/2022 14:17:58

## 2022-04-15 ENCOUNTER — Encounter (HOSPITAL_BASED_OUTPATIENT_CLINIC_OR_DEPARTMENT_OTHER): Payer: Medicare Other | Admitting: General Surgery

## 2022-04-15 DIAGNOSIS — I872 Venous insufficiency (chronic) (peripheral): Secondary | ICD-10-CM | POA: Diagnosis not present

## 2022-04-15 DIAGNOSIS — I130 Hypertensive heart and chronic kidney disease with heart failure and stage 1 through stage 4 chronic kidney disease, or unspecified chronic kidney disease: Secondary | ICD-10-CM | POA: Diagnosis not present

## 2022-04-15 DIAGNOSIS — L97822 Non-pressure chronic ulcer of other part of left lower leg with fat layer exposed: Secondary | ICD-10-CM | POA: Diagnosis not present

## 2022-04-15 DIAGNOSIS — L97212 Non-pressure chronic ulcer of right calf with fat layer exposed: Secondary | ICD-10-CM | POA: Diagnosis not present

## 2022-04-15 DIAGNOSIS — I890 Lymphedema, not elsewhere classified: Secondary | ICD-10-CM | POA: Diagnosis not present

## 2022-04-15 DIAGNOSIS — L97829 Non-pressure chronic ulcer of other part of left lower leg with unspecified severity: Secondary | ICD-10-CM | POA: Diagnosis not present

## 2022-04-15 DIAGNOSIS — L97819 Non-pressure chronic ulcer of other part of right lower leg with unspecified severity: Secondary | ICD-10-CM | POA: Diagnosis not present

## 2022-04-15 DIAGNOSIS — I272 Pulmonary hypertension, unspecified: Secondary | ICD-10-CM | POA: Diagnosis not present

## 2022-04-16 NOTE — Progress Notes (Signed)
REYNOLDS, FRANCHINA ([id number]) [phone].pdf Page 1 of 8 Visit Report for 04/15/2022 Chief Complaint Document Details Patient Name: Date of Service: [name], [name] 04/15/2022 1:15 PM Medical Record Number: [id number] Patient Account Number: [id number] Date of Birth/Sex: Treating RN: October 12, 1935 (87 y.o. M) Primary Care Provider: Carol Ada Other Clinician: Referring Provider: Treating Provider/Extender: Lianne Bushy Weeks in Treatment: 2 Information Obtained from: Patient Chief Complaint Patient presents for treatment of open ulcers due to venous insufficiency and lymphedema Electronic Signature(s) Signed: 04/15/2022 1:47:17 PM By: Fredirick Maudlin MD FACS Entered By: Fredirick Maudlin on 04/15/2022 13:47:17 -------------------------------------------------------------------------------- HPI Details Patient Name: Date of Service: [name], [name] 04/15/2022 1:15 PM Medical Record Number: [id number] Patient Account Number: [id number] Date of Birth/Sex: Treating RN: Nov 19, 1935 (86 y.o. M) Primary Care Provider: Carol Ada Other Clinician: Referring Provider: Treating Provider/Extender: Lianne Bushy Weeks in Treatment: 2 History of Present Illness HPI Description: ADMISSION This is an 87 year old man with a past medical history significant for congestive heart failure, CKD stage III, Parkinson's disease, venous insufficiency, pulmonary hypertension, chronic A-fib/a flutter on Eliquis, coronary artery disease, and lymphedema. He has been followed in the vascular surgery clinic by Dr. Scot Dock. Per Dr. Nicole Cella last note, lymphedema pumps have been ordered, but he has not yet received them. He referred the patient to the wound care clinic for closer management of his lymphedema and venous ulcers. Venous reflux studies have been performed and unfortunately, the reflux is deep and therefore not amenable to laser  ablation. ABIs in clinic today were noncompressible, but he had excellent Doppler signals. He has a small wound on his left anterior tibial surface with a bit of eschar present. He has another small wound on his right anterior tibial surface that is quite clean. He has a larger superficial ulcer on his right posterior calf that has a small amount of slough present. 04/07/2022: The only remaining open wounds are a small superficial lesion on his right posterior calf and a small ulcer on his left anterior tibial surface. They are both clean without any slough or eschar accumulation. Edema control is good. 04/15/2022: His wounds are healed. Electronic Signature(s) Signed: 04/15/2022 1:47:39 PM By: Fredirick Maudlin MD FACS Entered By: Fredirick Maudlin on 04/15/2022 13:47:39 Brendia Sacks [name] ([id number].pdf Page 2 of 8 -------------------------------------------------------------------------------- Physical Exam Details Patient Name: Date of Service: [name], [name] 04/15/2022 1:15 PM Medical Record Number: [id number] Patient Account Number: [id number] Date of Birth/Sex: Treating RN: 05-May-1935 (87 y.o. M) Primary Care Provider: Carol Ada Other Clinician: Referring Provider: Treating Provider/Extender: Prudencio Burly, Candace Weeks in Treatment: 2 Constitutional Hypertensive, asymptomatic. . . . no acute distress. Respiratory Normal work of breathing on room air. Notes 04/15/2022: His wounds are healed. Electronic Signature(s) Signed: 04/15/2022 1:48:26 PM By: Fredirick Maudlin MD FACS Entered By: Fredirick Maudlin on 04/15/2022 13:48:26 -------------------------------------------------------------------------------- Physician Orders Details Patient Name: Date of Service: [name], [name] 04/15/2022 1:15 PM Medical Record Number: [id number] Patient Account Number: [id number] Date of Birth/Sex: Treating RN: 15-Jan-1936 (87 y.o. Waldron Session Primary Care Provider: Carol Ada Other Clinician: Referring Provider: Treating Provider/Extender: Lianne Bushy Weeks in Treatment: 2 Verbal / Phone Orders: No Diagnosis Coding ICD-10 Coding Code Description L97.819 Non-pressure chronic ulcer of other part of right lower leg with unspecified severity L97.829 Non-pressure chronic ulcer of other part of left lower leg with unspecified severity I89.0 Lymphedema, not elsewhere classified I87.2 Venous insufficiency (chronic) (peripheral) I27.20 Pulmonary hypertension, unspecified [id number] Chronic diastolic (  congestive) heart failure N18.30 Chronic kidney disease, stage 3 unspecified Z79.01 Long term (current) use of anticoagulants Discharge From [facility] Services Discharge from [place]!!! Anesthetic (In clinic) Topical Lidocaine 4% applied to wound bed Bathing/ Shower/ Hygiene May shower and wash wound with soap and water. Edema Control - Lymphedema / SCD / Other Bilateral Lower Extremities Lymphedema Pumps. Use Lymphedema pumps on leg(s) 2-3 times a day for 45-60 minutes. If wearing any wraps or hose, do not remove them. Continue exercising as instructed. - when available Elevate legs to the level of the heart or above for 30 minutes daily and/or when sitting for 3-4 times a day throughout the day. - whenever sitting Avoid standing for long periods of time. Exercise regularly Moisturize legs daily. HENNESSY, ADA ([id number]) [phone].pdf Page 3 of 8 Electronic Signature(s) Signed: 04/15/2022 2:14:17 PM By: Fredirick Maudlin MD FACS Entered By: Fredirick Maudlin on 04/15/2022 13:48:36 -------------------------------------------------------------------------------- Problem List Details Patient Name: Date of Service: [name], [name] 04/15/2022 1:15 PM Medical Record Number: [id number] Patient Account Number: [id number] Date of Birth/Sex: Treating  RN: 31-Dec-1935 (87 y.o. Waldron Session Primary Care Provider: Carol Ada Other Clinician: Referring Provider: Treating Provider/Extender: Lianne Bushy Weeks in Treatment: 2 Active Problems ICD-10 Encounter Code Description Active Date MDM Diagnosis L97.819 Non-pressure chronic ulcer of other part of right lower leg with unspecified 03/30/2022 No Yes severity L97.829 Non-pressure chronic ulcer of other part of left lower leg with unspecified 03/30/2022 No Yes severity I89.0 Lymphedema, not elsewhere classified 03/30/2022 No Yes I87.2 Venous insufficiency (chronic) (peripheral) 03/30/2022 No Yes I27.20 Pulmonary hypertension, unspecified 03/30/2022 No Yes [id number] Chronic diastolic (congestive) heart failure 03/30/2022 No Yes N18.30 Chronic kidney disease, stage 3 unspecified 03/30/2022 No Yes Z79.01 Long term (current) use of anticoagulants 03/30/2022 No Yes Inactive Problems Resolved Problems Electronic Signature(s) Signed: 04/15/2022 1:47:04 PM By: Fredirick Maudlin MD FACS Previous Signature: 04/15/2022 1:41:49 PM Version By: Blanche East RN Entered By: Fredirick Maudlin on 04/15/2022 13:47:04 [name], [name] [name] ([id number] Page 4 of 8 -------------------------------------------------------------------------------- Progress Note Details Patient Name: Date of Service: [name], [name] 04/15/2022 1:15 PM Medical Record Number: [id number] Patient Account Number: [id number] Date of Birth/Sex: Treating RN: Jul 05, 1935 (87 y.o. M) Primary Care Provider: Carol Ada Other Clinician: Referring Provider: Treating Provider/Extender: Lianne Bushy Weeks in Treatment: 2 Subjective Chief Complaint Information obtained from Patient Patient presents for treatment of open ulcers due to venous insufficiency and lymphedema History of Present Illness (HPI) ADMISSION This is an 87 year old man with a past medical  history significant for congestive heart failure, CKD stage III, Parkinson's disease, venous insufficiency, pulmonary hypertension, chronic A-fib/a flutter on Eliquis, coronary artery disease, and lymphedema. He has been followed in the vascular surgery clinic by Dr. Scot Dock. Per Dr. Nicole Cella last note, lymphedema pumps have been ordered, but he has not yet received them. He referred the patient to the wound care clinic for closer management of his lymphedema and venous ulcers. Venous reflux studies have been performed and unfortunately, the reflux is deep and therefore not amenable to laser ablation. ABIs in clinic today were noncompressible, but he had excellent Doppler signals. He has a small wound on his left anterior tibial surface with a bit of eschar present. He has another small wound on his right anterior tibial surface that is quite clean. He has a larger superficial ulcer on his right posterior calf that has a small amount of slough present. 04/07/2022: The only remaining open wounds are  a small superficial lesion on his right posterior calf and a small ulcer on his left anterior tibial surface. They are both clean without any slough or eschar accumulation. Edema control is good. 04/15/2022: His wounds are healed. Patient History Information obtained from Patient, Caregiver, Chart. Family History Heart Disease - Father, Stroke, No family history of Cancer, Diabetes, Hereditary Spherocytosis, Hypertension, Kidney Disease, Lung Disease, Seizures, Thyroid Problems, Tuberculosis. Social History Never smoker, Marital Status - Married, Alcohol Use - Daily - 2 cocktails per day, Drug Use - No History, Caffeine Use - Daily. Medical History Eyes Patient has history of Cataracts - bil extractions Denies history of Glaucoma, Optic Neuritis Hematologic/Lymphatic Patient has history of Lymphedema - lower legs Cardiovascular Patient has history of Arrhythmia - afib, Congestive Heart Failure,  Coronary Artery Disease, Hypertension Endocrine Denies history of Type I Diabetes, Type II Diabetes Genitourinary Denies history of End Stage Renal Disease Integumentary (Skin) Denies history of History of Burn Musculoskeletal Patient has history of Osteoarthritis - both knees Denies history of Gout, Rheumatoid Arthritis, Osteomyelitis Oncologic Patient has history of Received Radiation - seed implants Denies history of Received Chemotherapy Psychiatric Patient has history of Confinement Anxiety Denies history of Anorexia/bulimia Hospitalization/Surgery History - left femur IM nail. - right shoulder melanoma excision. - cardioversion. - hemorrhoid surgery. - cardiac anfioplasty. - coronary atherectomy. - bil cataract extraction. - insertion prostate radiation seed. - lap cholecystectomy. - inguinal hernia repair. Medical A Surgical History Notes nd Respiratory elevated hemidiaphragm Cardiovascular pulmonary hypertension, mitral valve regurgitation, dilated cardiomyopathy Genitourinary CKD stage 3 Neurologic parkinson's Oncologic skin CA removed, prostate CA [name], [name] ([id number].pdf Page 5 of 8 Objective Constitutional Hypertensive, asymptomatic. no acute distress. Vitals Time Taken: 1:23 PM, Height: 71 in, Weight: 198 lbs, BMI: 27.6, Temperature: 97.7 F, Pulse: 76 bpm, Respiratory Rate: 18 breaths/min, Blood Pressure: 151/73 mmHg. Respiratory Normal work of breathing on room air. General Notes: 04/15/2022: His wounds are healed. Integumentary (Hair, Skin) Wound #2 status is Healed - Epithelialized. Original cause of wound was Blister. The date acquired was: 01/17/2022. The wound has been in treatment 2 weeks. The wound is located on the Right,Posterior Lower Leg. The wound measures 0cm length x 0cm width x 0cm depth; 0cm^2 area and 0cm^3 volume. There is Fat Layer (Subcutaneous Tissue) exposed. There is no tunneling or  undermining noted. There is a medium amount of serosanguineous drainage noted. The wound margin is flat and intact. There is large (67-100%) pink granulation within the wound bed. There is a small (1-33%) amount of necrotic tissue within the wound bed. The periwound skin appearance had no abnormalities noted for texture. The periwound skin appearance exhibited: Dry/Scaly, Hemosiderin Staining. Periwound temperature was noted as No Abnormality. Wound #4 status is Healed - Epithelialized. Original cause of wound was Gradually Appeared. The date acquired was: 01/17/2022. The wound has been in treatment 2 weeks. The wound is located on the Left,Anterior Lower Leg. The wound measures 0cm length x 0cm width x 0cm depth; 0cm^2 area and 0cm^3 volume. There is Fat Layer (Subcutaneous Tissue) exposed. There is no tunneling or undermining noted. There is a medium amount of serosanguineous drainage noted. The wound margin is flat and intact. There is large (67-100%) red granulation within the wound bed. There is a small (1-33%) amount of necrotic tissue within the wound bed including Eschar. The periwound skin appearance had no abnormalities noted for texture. The periwound skin appearance exhibited: Dry/Scaly, Hemosiderin Staining. Periwound temperature was noted as No Abnormality. Assessment Active Problems  ICD-10 Non-pressure chronic ulcer of other part of right lower leg with unspecified severity Non-pressure chronic ulcer of other part of left lower leg with unspecified severity Lymphedema, not elsewhere classified Venous insufficiency (chronic) (peripheral) Pulmonary hypertension, unspecified Chronic diastolic (congestive) heart failure Chronic kidney disease, stage 3 unspecified Long term (current) use of anticoagulants Plan Discharge From [facility] Services: Discharge from [place]!!! Anesthetic: (In clinic) Topical Lidocaine 4% applied to wound bed Bathing/ Shower/  Hygiene: May shower and wash wound with soap and water. Edema Control - Lymphedema / SCD / Other: Lymphedema Pumps. Use Lymphedema pumps on leg(s) 2-3 times a day for 45-60 minutes. If wearing any wraps or hose, do not remove them. Continue exercising as instructed. - when available Elevate legs to the level of the heart or above for 30 minutes daily and/or when sitting for 3-4 times a day throughout the day. - whenever sitting Avoid standing for long periods of time. Exercise regularly Moisturize legs daily. 04/15/2022: His wounds are healed. He has juxta lite stockings at home, but did not bring them with him today. We will apply Tubigrip to his legs and they will put his juxta lites on when he gets home. It appears that they are still waiting for lymphedema pumps to be delivered as per Dr. Mora Appl orders. At this time, we will discharge him from the wound care center and he may follow-up as needed. Electronic Signature(s) ROPER, HOEG [name] ([id number]) [id number] Page 6 of 8 Signed: 04/15/2022 1:49:14 PM By: Fredirick Maudlin MD FACS Entered By: Fredirick Maudlin on 04/15/2022 13:49:14 -------------------------------------------------------------------------------- HxROS Details Patient Name: Date of Service: [name], [name] 04/15/2022 1:15 PM Medical Record Number: [id number] Patient Account Number: [id number] Date of Birth/Sex: Treating RN: 1935/09/27 (87 y.o. M) Primary Care Provider: Carol Ada Other Clinician: Referring Provider: Treating Provider/Extender: Lianne Bushy Weeks in Treatment: 2 Information Obtained From Patient Caregiver Chart Eyes Medical History: Positive for: Cataracts - bil extractions Negative for: Glaucoma; Optic Neuritis Hematologic/Lymphatic Medical History: Positive for: Lymphedema - lower legs Respiratory Medical History: Past Medical History Notes: elevated  hemidiaphragm Cardiovascular Medical History: Positive for: Arrhythmia - afib; Congestive Heart Failure; Coronary Artery Disease; Hypertension Past Medical History Notes: pulmonary hypertension, mitral valve regurgitation, dilated cardiomyopathy Endocrine Medical History: Negative for: Type I Diabetes; Type II Diabetes Genitourinary Medical History: Negative for: End Stage Renal Disease Past Medical History Notes: CKD stage 3 Integumentary (Skin) Medical History: Negative for: History of Burn Musculoskeletal Medical History: Positive for: Osteoarthritis - both knees Negative for: Gout; Rheumatoid Arthritis; Osteomyelitis Neurologic Medical History: Past Medical History Notes: parkinson's Oncologic Medical HistoryNHIA, HEYMANN ([id number]) [phone].pdf Page 7 of 8 Positive for: Received Radiation - seed implants Negative for: Received Chemotherapy Past Medical History Notes: skin CA removed, prostate CA Psychiatric Medical History: Positive for: Confinement Anxiety Negative for: Anorexia/bulimia HBO Extended History Items Eyes: Cataracts Immunizations Pneumococcal Vaccine: Received Pneumococcal Vaccination: Yes Received Pneumococcal Vaccination On or After 60th Birthday: Yes Implantable Devices No devices added Hospitalization / Surgery History Type of Hospitalization/Surgery left femur IM nail right shoulder melanoma excision cardioversion hemorrhoid surgery cardiac anfioplasty coronary atherectomy bil cataract extraction insertion prostate radiation seed lap cholecystectomy inguinal hernia repair Family and Social History Cancer: No; Diabetes: No; Heart Disease: Yes - Father; Hereditary Spherocytosis: No; Hypertension: No; Kidney Disease: No; Lung Disease: No; Seizures: No; Stroke: Yes; Thyroid Problems: No; Tuberculosis: No; Never smoker; Marital Status - Married; Alcohol Use: Daily - 2 cocktails per day; Drug Use:  No  History; Caffeine Use: Daily; Financial Concerns: No; Food, Clothing or Shelter Needs: No; Support System Lacking: No; Transportation Concerns: No Electronic Signature(s) Signed: 04/15/2022 2:14:17 PM By: Fredirick Maudlin MD FACS Entered By: Fredirick Maudlin on 04/15/2022 13:47:57 -------------------------------------------------------------------------------- SuperBill Details Patient Name: Date of Service: [name], [name] 04/15/2022 Medical Record Number: [id number] Patient Account Number: [id number] Date of Birth/Sex: Treating RN: [birth year] (87 y.o. M) Primary Care Provider: Carol Ada Other Clinician: Referring Provider: Treating Provider/Extender: Lianne Bushy Weeks in Treatment: 2 Diagnosis Coding ICD-10 Codes Code Description [phone] Non-pressure chronic ulcer of other part of right lower leg with unspecified severity L97.829 Non-pressure chronic ulcer of other part of left lower leg with unspecified severity I89.0 Lymphedema, not elsewhere classified I87.2 Venous insufficiency (chronic) (peripheral) I27.20 Pulmonary hypertension, unspecified [id number] Chronic diastolic (congestive) heart failure [name], [name] ([id number].pdf Page 8 of 8 N18.30 Chronic kidney disease, stage 3 unspecified Z79.01 Long term (current) use of anticoagulants Facility Procedures : CPT4 Code: [id number] Description: [id number] - WOUND CARE VISIT-LEV 3 EST PT Modifier: 25 Quantity: 1 Physician Procedures : CPT4 Code Description Modifier [id number] - WC PHYS LEVEL 3 - EST PT ICD-10 Diagnosis Description L97.819 Non-pressure chronic ulcer of other part of right lower leg with unspecified severity L97.829 Non-pressure chronic ulcer of other part of left  lower leg with unspecified severity I89.0 Lymphedema, not elsewhere classified I87.2 Venous insufficiency (chronic) (peripheral) Quantity: 1 Electronic Signature(s) Signed: 04/15/2022  2:34:30 PM By: Blanche East RN Signed: 04/15/2022 3:03:37 PM By: Fredirick Maudlin MD FACS Previous Signature: 04/15/2022 1:49:33 PM Version By: Fredirick Maudlin MD FACS Entered By: Blanche East on 04/15/2022 14:34:30

## 2022-04-16 NOTE — Progress Notes (Signed)
[name] ([id number]) [id number] Page 1 of 10 Visit Report for 04/15/2022 Arrival Information Details Patient Name: Date of Service: [name], [name] 04/15/2022 1:15 PM Medical Record Number: [id number] Patient Account Number: [id number] Date of Birth/Sex: Treating RN: 1935-09-15 (87 y.o. [name] Session Primary Care Mireya Meditz: Carol Ada Other Clinician: Referring Gillis Boardley: Treating [place] Zaragoza/Extender: Lianne Bushy Weeks in Treatment: 2 Visit Information History Since Last Visit Added or deleted any medications: No Patient Arrived: Wheel Chair Any new allergies or adverse reactions: No Arrival Time: 13:22 Had a fall or experienced change in No Accompanied By: self activities of daily living that may affect Transfer Assistance: None risk of falls: Patient Identification Verified: Yes Signs or symptoms of abuse/neglect since last visito No Secondary Verification Process Completed: Yes Hospitalized since last visit: No Patient Requires Transmission-Based Precautions: No Implantable device outside of the clinic excluding No Patient Has Alerts: Yes cellular tissue based products placed in the center Patient Alerts: Patient on Blood Thinner since last visit: Bil ABIs N/C Has Compression in Place as Prescribed: Yes Pain Present Now: No Electronic Signature(s) Signed: 04/15/2022 4:26:35 PM By: Blanche East RN Entered By: Blanche East on 04/15/2022 13:22:56 -------------------------------------------------------------------------------- Clinic Level of Care Assessment Details Patient Name: Date of Service: [name], [name] 04/15/2022 1:15 PM Medical Record Number: [id number] Patient Account Number: [id number] Date of Birth/Sex: Treating RN: [birth year] (87 y.o. [name] Session Primary Care Raushanah Osmundson: Carol Ada Other Clinician: Referring Ayinde Swim: Treating Jkai Arwood/Extender: Lianne Bushy Weeks  in Treatment: 2 Clinic Level of Care Assessment Items TOOL 4 Quantity Score X- 1 0 Use when only an EandM is performed on FOLLOW-UP visit ASSESSMENTS - Nursing Assessment / Reassessment X- 1 10 Reassessment of Co-morbidities (includes updates in patient status) X- 1 5 Reassessment of Adherence to Treatment Plan ASSESSMENTS - Wound and Skin A ssessment / Reassessment X - Simple Wound Assessment / Reassessment - one wound 1 5 []$  - 0 Complex Wound Assessment / Reassessment - multiple wounds []$  - 0 Dermatologic / Skin Assessment (not related to wound area) ASSESSMENTS - Focused Assessment X- 1 5 Circumferential Edema Measurements - multi extremities []$  - 0 Nutritional Assessment / Counseling / Intervention [name] ([id number].pdf Page 2 of 10 []$  - 0 Lower Extremity Assessment (monofilament, tuning fork, pulses) []$  - 0 Peripheral Arterial Disease Assessment (using hand held doppler) ASSESSMENTS - Ostomy and/or Continence Assessment and Care []$  - 0 Incontinence Assessment and Management []$  - 0 Ostomy Care Assessment and Management (repouching, etc.) PROCESS - Coordination of Care X - Simple Patient / Family Education for ongoing care 1 15 []$  - 0 Complex (extensive) Patient / Family Education for ongoing care []$  - 0 Staff obtains Programmer, systems, Records, T Results / Process Orders est []$  - 0 Staff telephones HHA, Nursing Homes / Clarify orders / etc []$  - 0 Routine Transfer to another Facility (non-emergent condition) []$  - 0 Routine Hospital Admission (non-emergent condition) []$  - 0 New Admissions / Biomedical engineer / Ordering NPWT Apligraf, etc. , []$  - 0 Emergency Hospital Admission (emergent condition) X- 1 10 Simple Discharge Coordination []$  - 0 Complex (extensive) Discharge Coordination PROCESS - Special Needs []$  - 0 Pediatric / Minor Patient Management []$  - 0 Isolation Patient Management []$  - 0 Hearing / Language  / Visual special needs []$  - 0 Assessment of Community assistance (transportation, D/C planning, etc.) []$  - 0 Additional assistance / Altered mentation []$  - 0 Support Surface(s) Assessment (bed, cushion, seat, etc.) INTERVENTIONS -  Wound Cleansing / Measurement X - Simple Wound Cleansing - one wound 1 5 []$  - 0 Complex Wound Cleansing - multiple wounds X- 1 5 Wound Imaging (photographs - any number of wounds) []$  - 0 Wound Tracing (instead of photographs) X- 1 5 Simple Wound Measurement - one wound []$  - 0 Complex Wound Measurement - multiple wounds INTERVENTIONS - Wound Dressings X - Small Wound Dressing one or multiple wounds 1 10 []$  - 0 Medium Wound Dressing one or multiple wounds []$  - 0 Large Wound Dressing one or multiple wounds []$  - 0 Application of Medications - topical []$  - 0 Application of Medications - injection INTERVENTIONS - Miscellaneous []$  - 0 External ear exam []$  - 0 Specimen Collection (cultures, biopsies, blood, body fluids, etc.) []$  - 0 Specimen(s) / Culture(s) sent or taken to Lab for analysis []$  - 0 Patient Transfer (multiple staff / Civil Service fast streamer / Similar devices) []$  - 0 Simple Staple / Suture removal (25 or less) []$  - 0 Complex Staple / Suture removal (26 or more) []$  - 0 Hypo / Hyperglycemic Management (close monitor of Blood Glucose) [name] ([id number] Page 3 of 10 []$  - 0 Ankle / Brachial Index (ABI) - do not check if billed separately X- 1 5 Vital Signs Has the patient been seen at the hospital within the last three years: Yes Total Score: 80 Level Of Care: New/Established - Level 3 Electronic Signature(s) Signed: 04/15/2022 4:26:35 PM By: Blanche East RN Entered By: Blanche East on 04/15/2022 14:34:19 -------------------------------------------------------------------------------- Encounter Discharge Information Details Patient Name: Date of Service: [name], [name] 04/15/2022 1:15  PM Medical Record Number: [id number] Patient Account Number: [id number] Date of Birth/Sex: Treating RN: 1936-02-24 (87 y.o. [name] Session Primary Care Sophy Mesler: Carol Ada Other Clinician: Referring Brant Peets: Treating Cordie Beazley/Extender: Lianne Bushy Weeks in Treatment: 2 Encounter Discharge Information Items Discharge Condition: Stable Ambulatory Status: Wheelchair Discharge Destination: Home Transportation: Private Auto Accompanied By: self Schedule Follow-up Appointment: Yes Clinical Summary of Care: Electronic Signature(s) Signed: 04/15/2022 4:26:35 PM By: Blanche East RN Entered By: Blanche East on 04/15/2022 13:58:42 -------------------------------------------------------------------------------- Lower Extremity Assessment Details Patient Name: Date of Service: [name], [name] 04/15/2022 1:15 PM Medical Record Number: [id number] Patient Account Number: [id number] Date of Birth/Sex: Treating RN: 1935-11-05 (87 y.o. [name] Session Primary Care Daylani Deblois: Carol Ada Other Clinician: Referring Butler Vegh: Treating Lanya Bucks/Extender: Prudencio Burly, Candace Weeks in Treatment: 2 Edema Assessment Assessed: [Left: No] [Right: No] Edema: [Left: Yes] [Right: Yes] Calf Left: Right: Point of Measurement: From Medial Instep 37 cm 37.7 cm Ankle Left: Right: Point of Measurement: From Medial Instep 24 cm 23.4 cm Vascular Assessment Rotert, Senan [name] ([id number]) [id number] Page 4 of 10] Pulses: Dorsalis Pedis Palpable: [Left:Yes] [Right:Yes] Electronic Signature(s) Signed: 04/15/2022 4:26:35 PM By: Blanche East RN Entered By: Blanche East on 04/15/2022 13:27:39 -------------------------------------------------------------------------------- Multi Wound Chart Details Patient Name: Date of Service: [name], [name] 04/15/2022 1:15 PM Medical Record Number: [id number] Patient Account Number:  [id number] Date of Birth/Sex: Treating RN: October 01, 1935 (87 y.o. M) Primary Care Phoua Hoadley: Carol Ada Other Clinician: Referring Andrw Mcguirt: Treating Trysten Berti/Extender: Prudencio Burly, Candace Weeks in Treatment: 2 Vital Signs Height(in): 71 Pulse(bpm): 31 Weight(lbs): 198 Blood Pressure(mmHg): 151/73 Body Mass Index(BMI): 27.6 Temperature(F): 97.7 Respiratory Rate(breaths/min): 18 [2:Photos:] [N/A:N/A] Right, Posterior Lower Leg Left, Anterior Lower Leg N/A Wound Location: Blister Gradually Appeared N/A Wounding Event: Lymphedema Lymphedema N/A Primary Etiology: Venous Leg Ulcer Venous Leg Ulcer N/A Secondary Etiology: Cataracts, Lymphedema, Arrhythmia, Cataracts, Lymphedema,  Arrhythmia, N/A Comorbid History: Congestive Heart Failure, Coronary Congestive Heart Failure, Coronary Artery Disease, Hypertension, Artery Disease, Hypertension, Osteoarthritis, Received Radiation, Osteoarthritis, Received Radiation, Confinement Anxiety Confinement Anxiety 01/17/2022 01/17/2022 N/A Date Acquired: 2 2 N/A Weeks of Treatment: Open Open N/A Wound Status: No No N/A Wound Recurrence: 0.1x0.1x0.1 0.2x0.2x0.1 N/A Measurements L x W x D (cm) 0.008 0.031 N/A A (cm) : rea 0.001 0.003 N/A Volume (cm) : 99.80% 73.70% N/A % Reduction in Area: 99.70% 75.00% N/A % Reduction in Volume: Full Thickness Without Exposed Full Thickness Without Exposed N/A Classification: Support Structures Support Structures Medium Medium N/A Exudate A mount: Serosanguineous Serosanguineous N/A Exudate Type: red, brown red, brown N/A Exudate Color: Flat and Intact Flat and Intact N/A Wound Margin: Large (67-100%) Large (67-100%) N/A Granulation Amount: Pink Red N/A Granulation Quality: Small (1-33%) Small (1-33%) N/A Necrotic Amount: Adherent Slough Eschar N/A Necrotic Tissue: Fat Layer (Subcutaneous Tissue): Yes Fat Layer (Subcutaneous Tissue): Yes N/A Exposed Structures: Fascia:  No Fascia: No Tendon: No Tendon: No Muscle: No Muscle: No Joint: No Joint: No Bone: No Bone: No Large (67-100%) Large (67-100%) N/A Epithelialization: [name], Barbati [name] ([id number] Page 5 of 10 No Abnormalities Noted No Abnormalities Noted N/A Periwound Skin Texture: Dry/Scaly: Yes Dry/Scaly: Yes N/A Periwound Skin Moisture: Hemosiderin Staining: Yes Hemosiderin Staining: Yes N/A Periwound Skin Color: No Abnormality No Abnormality N/A Temperature: Treatment Notes Wound #2 (Lower Leg) Wound Laterality: Right, Posterior Cleanser Peri-Wound Care Sween Lotion (Moisturizing lotion) Discharge Instruction: Apply moisturizing lotion as directed Topical Primary Dressing Sorbalgon AG Dressing 2x2 (in/in) Discharge Instruction: Apply to wound bed as instructed Secondary Dressing Woven Gauze Sponge, Non-Sterile 4x4 in Discharge Instruction: Apply over primary dressing as directed. Secured With Compression Wrap FourPress (4 layer compression wrap) Discharge Instruction: Apply four layer compression as directed. May also use Miliken CoFlex 2 layer compression system as alternative. Compression Stockings Add-Ons Wound #4 (Lower Leg) Wound Laterality: Left, Anterior Cleanser Peri-Wound Care Sween Lotion (Moisturizing lotion) Discharge Instruction: Apply moisturizing lotion as directed Topical Primary Dressing Sorbalgon AG Dressing 2x2 (in/in) Discharge Instruction: Apply to wound bed as instructed Secondary Dressing Woven Gauze Sponge, Non-Sterile 4x4 in Discharge Instruction: Apply over primary dressing as directed. Secured With Compression Wrap FourPress (4 layer compression wrap) Discharge Instruction: Apply four layer compression as directed. May also use Miliken CoFlex 2 layer compression system as alternative. Compression Stockings Add-Ons Electronic Signature(s) Signed: 04/15/2022 1:47:11 PM By: Fredirick Maudlin MD  FACS Entered By: Fredirick Maudlin on 04/15/2022 13:47:11 Brendia Sacks [name] ([id number] Page 6 of 10 -------------------------------------------------------------------------------- Multi-Disciplinary Care Plan Details Patient Name: Date of Service: JAXZEN, ENTIN 04/15/2022 1:15 PM Medical Record Number: [id number] Patient Account Number: [id number] Date of Birth/Sex: Treating RN: Jun 20, 1935 (87 y.o. [name] Session Primary Care Layanna Charo: Carol Ada Other Clinician: Referring Karla Vines: Treating Rayan Dyal/Extender: Lianne Bushy Weeks in Treatment: 2 Multidisciplinary Care Plan reviewed with physician Active Inactive Electronic Signature(s) Signed: 04/15/2022 2:33:31 PM By: Blanche East RN Previous Signature: 04/15/2022 1:41:15 PM Version By: Blanche East RN Entered By: Blanche East on 04/15/2022 14:33:31 -------------------------------------------------------------------------------- Pain Assessment Details Patient Name: Date of Service: [name], HARNEY 04/15/2022 1:15 PM Medical Record Number: [id number] Patient Account Number: [id number] Date of Birth/Sex: Treating RN: 04/06/1935 (87 y.o. [name] Session Primary Care Tong Pieczynski: Carol Ada Other Clinician: Referring Wynne Rozak: Treating Waniya Hoglund/Extender: Lianne Bushy Weeks in Treatment: 2 Active Problems Location of Pain Severity and Description of Pain Patient Has Paino No Site Locations Rate the pain. Current Pain  Level: 0 Pain Management and Medication Current Pain Management: Electronic Signature(s) Signed: 04/15/2022 4:26:35 PM By: Blanche East RN Entered By: Blanche East on 04/15/2022 13:23:39 [name] ([id number] Page 7 of 10 -------------------------------------------------------------------------------- Patient/Caregiver Education Details Patient Name: Date of  Service: [name], [name] 2/16/2024andnbsp1:15 PM Medical Record Number: [id number] Patient Account Number: [id number] Date of Birth/Gender: Treating RN: Nov 03, 1935 (87 y.o. [name] Session Primary Care Physician: Carol Ada Other Clinician: Referring Physician: Treating Physician/Extender: Maxcine Ham in Treatment: 2 Education Assessment Education Provided To: Patient Education Topics Provided Electronic Signature(s) Signed: 04/15/2022 4:26:35 PM By: Blanche East RN Entered By: Blanche East on 04/15/2022 13:41:23 -------------------------------------------------------------------------------- Wound Assessment Details Patient Name: Date of Service: FREDIRICK, BUTTON 04/15/2022 1:15 PM Medical Record Number: [id number] Patient Account Number: [id number] Date of Birth/Sex: Treating RN: Jun 26, 1935 (87 y.o. [name] Session Primary Care Khadejah Son: Carol Ada Other Clinician: Referring Heith Haigler: Treating Rexine Gowens/Extender: Lianne Bushy Weeks in Treatment: 2 Wound Status Wound Number: 2 Primary Lymphedema Etiology: Wound Location: Right, Posterior Lower Leg Secondary Venous Leg Ulcer Wounding Event: Blister Etiology: Date Acquired: 01/17/2022 Wound Healed - Epithelialized Weeks Of Treatment: 2 Status: Clustered Wound: No Comorbid Cataracts, Lymphedema, Arrhythmia, Congestive Heart Failure, History: Coronary Artery Disease, Hypertension, Osteoarthritis, Received Radiation, Confinement Anxiety Photos Wound Measurements Length: (cm) Width: (cm) Depth: (cm) Area: (cm) [name], [name] ([id number]) Volume: (cm) 0 % Reduction in Area: 100% 0 % Reduction in Volume: 100% 0 Epithelialization: Large (67-100%) 0 Tunneling: No [id number] Page 8 of 10 0 Undermining: No Wound Description Classification: Full Thickness Without Exposed Suppor Wound Margin: Flat and Intact Exudate Amount:  Medium Exudate Type: Serosanguineous Exudate Color: red, brown t Structures Foul Odor After Cleansing: No Slough/Fibrino Yes Wound Bed Granulation Amount: Large (67-100%) Exposed Structure Granulation Quality: Pink Fascia Exposed: No Necrotic Amount: Small (1-33%) Fat Layer (Subcutaneous Tissue) Exposed: Yes Tendon Exposed: No Muscle Exposed: No Joint Exposed: No Bone Exposed: No Periwound Skin Texture Texture Color No Abnormalities Noted: Yes No Abnormalities Noted: No Hemosiderin Staining: Yes Moisture No Abnormalities Noted: No Temperature / Pain Dry / Scaly: Yes Temperature: No Abnormality Treatment Notes Wound #2 (Lower Leg) Wound Laterality: Right, Posterior Cleanser Peri-Wound Care Sween Lotion (Moisturizing lotion) Discharge Instruction: Apply moisturizing lotion as directed Topical Primary Dressing Sorbalgon AG Dressing 2x2 (in/in) Discharge Instruction: Apply to wound bed as instructed Secondary Dressing Woven Gauze Sponge, Non-Sterile 4x4 in Discharge Instruction: Apply over primary dressing as directed. Secured With Compression Wrap FourPress (4 layer compression wrap) Discharge Instruction: Apply four layer compression as directed. May also use Miliken CoFlex 2 layer compression system as alternative. Compression Stockings Add-Ons Electronic Signature(s) Signed: 04/15/2022 4:26:35 PM By: Blanche East RN Entered By: Blanche East on 04/15/2022 13:47:39 -------------------------------------------------------------------------------- Wound Assessment Details Patient Name: Date of Service: [name], [name] 04/15/2022 1:15 PM Medical Record Number: [id number] Patient Account Number: [id number] Date of Birth/Sex: Treating RN: September 01, 1935 (87 y.o. [name] Session Primary Care Wymon Swaney: Carol Ada Other Clinician: Referring Taler Kushner: Treating Zenobia Kuennen/Extender: Lianne Bushy Weeks in Treatment: 2 HALEY, KUCHERA [name] ([id number])  [id number] Page 9 of 10 Wound Status Wound Number: 4 Primary Lymphedema Etiology: Wound Location: Left, Anterior Lower Leg Secondary Venous Leg Ulcer Wounding Event: Gradually Appeared Etiology: Date Acquired: 01/17/2022 Wound Healed - Epithelialized Weeks Of Treatment: 2 Status: Clustered Wound: No Comorbid Cataracts, Lymphedema, Arrhythmia, Congestive Heart Failure, History: Coronary Artery Disease, Hypertension, Osteoarthritis, Received Radiation, Confinement Anxiety Photos Wound Measurements Length: (cm) Width: (cm) Depth: (cm) Area: (  cm) Volume: (cm) 0 % Reduction in Area: 100% 0 % Reduction in Volume: 100% 0 Epithelialization: Large (67-100%) 0 Tunneling: No 0 Undermining: No Wound Description Classification: Full Thickness Without Exposed Suppor Wound Margin: Flat and Intact Exudate Amount: Medium Exudate Type: Serosanguineous Exudate Color: red, brown t Structures Foul Odor After Cleansing: No Slough/Fibrino Yes Wound Bed Granulation Amount: Large (67-100%) Exposed Structure Granulation Quality: Red Fascia Exposed: No Necrotic Amount: Small (1-33%) Fat Layer (Subcutaneous Tissue) Exposed: Yes Necrotic Quality: Eschar Tendon Exposed: No Muscle Exposed: No Joint Exposed: No Bone Exposed: No Periwound Skin Texture Texture Color No Abnormalities Noted: Yes No Abnormalities Noted: No Hemosiderin Staining: Yes Moisture No Abnormalities Noted: No Temperature / Pain Dry / Scaly: Yes Temperature: No Abnormality Treatment Notes Wound #4 (Lower Leg) Wound Laterality: Left, Anterior Cleanser Peri-Wound Care Sween Lotion (Moisturizing lotion) Discharge Instruction: Apply moisturizing lotion as directed Topical Primary Dressing Sorbalgon AG Dressing 2x2 (in/in) Discharge Instruction: Apply to wound bed as instructed Secondary Dressing Woven Gauze Sponge, Non-Sterile 4x4 in Discharge Instruction: Apply over primary dressing as  directed. GIDDEON, BESSINGER [name] ([id number]) [id number] Page 10 of 10 Secured With Compression Wrap FourPress (4 layer compression wrap) Discharge Instruction: Apply four layer compression as directed. May also use Miliken CoFlex 2 layer compression system as alternative. Compression Stockings Add-Ons Electronic Signature(s) Signed: 04/15/2022 4:26:35 PM By: Blanche East RN Entered By: Blanche East on 04/15/2022 13:47:44 -------------------------------------------------------------------------------- Vitals Details Patient Name: Date of Service: [name], [name] 04/15/2022 1:15 PM Medical Record Number: [id number] Patient Account Number: [id number] Date of Birth/Sex: Treating RN: June 11, 1935 (87 y.o. [name] Session Primary Care Geoffrey Hynes: Carol Ada Other Clinician: Referring Daniell Mancinas: Treating Jaydenn Boccio/Extender: Lianne Bushy Weeks in Treatment: 2 Vital Signs Time Taken: 13:23 Temperature (F): 97.7 Height (in): 71 Pulse (bpm): 76 Weight (lbs): 198 Respiratory Rate (breaths/min): 18 Body Mass Index (BMI): 27.6 Blood Pressure (mmHg): 151/73 Reference Range: 80 - 120 mg / dl Electronic Signature(s) Signed: 04/15/2022 4:26:35 PM By: Blanche East RN Entered By: Blanche East on 04/15/2022 13:23:31

## 2022-04-17 ENCOUNTER — Other Ambulatory Visit: Payer: Self-pay | Admitting: Neurology

## 2022-04-17 DIAGNOSIS — G20A1 Parkinson's disease without dyskinesia, without mention of fluctuations: Secondary | ICD-10-CM

## 2022-04-19 ENCOUNTER — Encounter (HOSPITAL_BASED_OUTPATIENT_CLINIC_OR_DEPARTMENT_OTHER): Payer: Medicare Other | Admitting: General Surgery

## 2022-04-21 ENCOUNTER — Encounter: Payer: Self-pay | Admitting: Vascular Surgery

## 2022-05-12 DIAGNOSIS — S81802A Unspecified open wound, left lower leg, initial encounter: Secondary | ICD-10-CM | POA: Diagnosis not present

## 2022-05-12 DIAGNOSIS — L089 Local infection of the skin and subcutaneous tissue, unspecified: Secondary | ICD-10-CM | POA: Diagnosis not present

## 2022-05-18 DIAGNOSIS — D485 Neoplasm of uncertain behavior of skin: Secondary | ICD-10-CM | POA: Diagnosis not present

## 2022-05-18 DIAGNOSIS — Z85820 Personal history of malignant melanoma of skin: Secondary | ICD-10-CM | POA: Diagnosis not present

## 2022-05-18 DIAGNOSIS — I8311 Varicose veins of right lower extremity with inflammation: Secondary | ICD-10-CM | POA: Diagnosis not present

## 2022-05-18 DIAGNOSIS — D4819 Other specified neoplasm of uncertain behavior of connective and other soft tissue: Secondary | ICD-10-CM | POA: Diagnosis not present

## 2022-05-18 DIAGNOSIS — Z85828 Personal history of other malignant neoplasm of skin: Secondary | ICD-10-CM | POA: Diagnosis not present

## 2022-05-18 DIAGNOSIS — L821 Other seborrheic keratosis: Secondary | ICD-10-CM | POA: Diagnosis not present

## 2022-05-18 DIAGNOSIS — L308 Other specified dermatitis: Secondary | ICD-10-CM | POA: Diagnosis not present

## 2022-05-18 DIAGNOSIS — I872 Venous insufficiency (chronic) (peripheral): Secondary | ICD-10-CM | POA: Diagnosis not present

## 2022-05-18 DIAGNOSIS — C44629 Squamous cell carcinoma of skin of left upper limb, including shoulder: Secondary | ICD-10-CM | POA: Diagnosis not present

## 2022-05-18 DIAGNOSIS — L812 Freckles: Secondary | ICD-10-CM | POA: Diagnosis not present

## 2022-05-18 DIAGNOSIS — I8312 Varicose veins of left lower extremity with inflammation: Secondary | ICD-10-CM | POA: Diagnosis not present

## 2022-05-24 ENCOUNTER — Encounter (HOSPITAL_BASED_OUTPATIENT_CLINIC_OR_DEPARTMENT_OTHER): Payer: Medicare Other | Attending: General Surgery | Admitting: General Surgery

## 2022-05-24 DIAGNOSIS — I420 Dilated cardiomyopathy: Secondary | ICD-10-CM | POA: Insufficient documentation

## 2022-05-24 DIAGNOSIS — Z7901 Long term (current) use of anticoagulants: Secondary | ICD-10-CM | POA: Diagnosis not present

## 2022-05-24 DIAGNOSIS — L97412 Non-pressure chronic ulcer of right heel and midfoot with fat layer exposed: Secondary | ICD-10-CM | POA: Insufficient documentation

## 2022-05-24 DIAGNOSIS — I130 Hypertensive heart and chronic kidney disease with heart failure and stage 1 through stage 4 chronic kidney disease, or unspecified chronic kidney disease: Secondary | ICD-10-CM | POA: Diagnosis not present

## 2022-05-24 DIAGNOSIS — I2510 Atherosclerotic heart disease of native coronary artery without angina pectoris: Secondary | ICD-10-CM | POA: Diagnosis not present

## 2022-05-24 DIAGNOSIS — G20A1 Parkinson's disease without dyskinesia, without mention of fluctuations: Secondary | ICD-10-CM | POA: Diagnosis not present

## 2022-05-24 DIAGNOSIS — I272 Pulmonary hypertension, unspecified: Secondary | ICD-10-CM | POA: Insufficient documentation

## 2022-05-24 DIAGNOSIS — L97812 Non-pressure chronic ulcer of other part of right lower leg with fat layer exposed: Secondary | ICD-10-CM | POA: Diagnosis not present

## 2022-05-24 DIAGNOSIS — I872 Venous insufficiency (chronic) (peripheral): Secondary | ICD-10-CM | POA: Diagnosis not present

## 2022-05-24 DIAGNOSIS — L97212 Non-pressure chronic ulcer of right calf with fat layer exposed: Secondary | ICD-10-CM | POA: Diagnosis not present

## 2022-05-24 DIAGNOSIS — L97222 Non-pressure chronic ulcer of left calf with fat layer exposed: Secondary | ICD-10-CM | POA: Diagnosis not present

## 2022-05-24 DIAGNOSIS — I890 Lymphedema, not elsewhere classified: Secondary | ICD-10-CM | POA: Diagnosis not present

## 2022-05-24 DIAGNOSIS — N183 Chronic kidney disease, stage 3 unspecified: Secondary | ICD-10-CM | POA: Diagnosis not present

## 2022-05-24 DIAGNOSIS — L97422 Non-pressure chronic ulcer of left heel and midfoot with fat layer exposed: Secondary | ICD-10-CM | POA: Insufficient documentation

## 2022-05-24 DIAGNOSIS — I4891 Unspecified atrial fibrillation: Secondary | ICD-10-CM | POA: Diagnosis not present

## 2022-05-24 DIAGNOSIS — L97822 Non-pressure chronic ulcer of other part of left lower leg with fat layer exposed: Secondary | ICD-10-CM | POA: Insufficient documentation

## 2022-05-24 DIAGNOSIS — I5032 Chronic diastolic (congestive) heart failure: Secondary | ICD-10-CM | POA: Insufficient documentation

## 2022-05-24 DIAGNOSIS — L89612 Pressure ulcer of right heel, stage 2: Secondary | ICD-10-CM | POA: Diagnosis not present

## 2022-05-24 NOTE — Progress Notes (Signed)
[name] [name] ([id number]) [id number] Page 1 of 15 Visit Report for 05/24/2022 Arrival Information Details Patient Name: Date of Service: [name], [name] 05/24/2022 2:45 PM Medical Record Number: [id number] Patient Account Number: [id number] Date of Birth/Sex: Treating RN: 09-Jan-1936 (87 y.o. [name] Primary Care Muaaz Brau: Carol Ada Other Clinician: Referring Darrelle Barrell: Treating Floella Ensz/Extender: Lianne Bushy Weeks in Treatment: 0 Visit Information History Since Last Visit Added or deleted any medications: No Patient Arrived: Wheel Chair Any new allergies or adverse reactions: No Arrival Time: 15:07 Had a fall or experienced change in No Accompanied By: wife activities of daily living that may affect Transfer Assistance: Manual risk of falls: Patient Identification Verified: Yes Signs or symptoms of abuse/neglect since last visito No Secondary Verification Process Completed: Yes Hospitalized since last visit: No Implantable device outside of the clinic excluding No cellular tissue based products placed in the center since last visit: Has Dressing in Place as Prescribed: No Has Compression in Place as Prescribed: No Pain Present Now: No Electronic Signature(s) Signed: 05/24/2022 4:17:19 PM By: Adline Peals Entered By: Adline Peals on 05/24/2022 15:09:36 -------------------------------------------------------------------------------- Compression Therapy Details Patient Name: Date of Service: [name] 05/24/2022 2:45 PM Medical Record Number: [id number] Patient Account Number: [id number] Date of Birth/Sex: Treating RN: November 27, 1935 (87 y.o. [name] Primary Care Jaquil Todt: Carol Ada Other Clinician: Referring Joshuah Minella: Treating Itza Maniaci/Extender: Lianne Bushy Weeks in Treatment: 0 Compression Therapy Performed for Wound Assessment: Wound #7  Right,Lateral Lower Leg Performed By: Clinician Adline Peals, RN Compression Type: Double Layer Post Procedure Diagnosis Same as Pre-procedure Electronic Signature(s) Signed: 05/24/2022 4:17:19 PM By: Adline Peals Entered By: Adline Peals on 05/24/2022 15:31:02 -------------------------------------------------------------------------------- Compression Therapy Details Patient Name: Date of Service: [name] 05/24/2022 2:45 PM Medical Record Number: [id number] Patient Account Number: [id number] Date of Birth/Sex: Treating RN: 30-Jun-1935 (87 y.o. [name] Primary Care Amitai Delaughter: Carol Ada Other Clinician: Referring Marilena Trevathan: Treating Tayten Heber/Extender: Lianne Bushy Weeks in Treatment: 0 Compression Therapy Performed for Wound Assessment: Wound #8 Left,Lateral Lower Leg Performed By: Clinician Adline Peals, RN Compression Type: Double Layer Post Procedure Diagnosis Same as [name] ([id number].pdf Page 2 of 15 Electronic Signature(s) Signed: 05/24/2022 4:17:19 PM By: Sabas Sous By: Adline Peals on 05/24/2022 15:31:02 -------------------------------------------------------------------------------- Encounter Discharge Information Details Patient Name: Date of Service: [name], [name] 05/24/2022 2:45 PM Medical Record Number: [id number] Patient Account Number: [id number] Date of Birth/Sex: Treating RN: September 21, 1935 (87 y.o. [name] Primary Care Steffi Noviello: Carol Ada Other Clinician: Referring Lakeia Bradshaw: Treating Eris Hannan/Extender: Lianne Bushy Weeks in Treatment: 0 Encounter Discharge Information Items Post Procedure Vitals Discharge Condition: Stable Temperature (F): 97.4 Ambulatory Status: Wheelchair Pulse (bpm): 80 Discharge Destination: Home Respiratory Rate (breaths/min):  20 Transportation: Private Auto Blood Pressure (mmHg): 146/61 Accompanied By: wife Schedule Follow-up Appointment: Yes Clinical Summary of Care: Patient Declined Electronic Signature(s) Signed: 05/24/2022 4:17:19 PM By: Adline Peals Entered By: Adline Peals on 05/24/2022 16:05:24 -------------------------------------------------------------------------------- Lower Extremity Assessment Details Patient Name: Date of Service: [name] 05/24/2022 2:45 PM Medical Record Number: [id number] Patient Account Number: [id number] Date of Birth/Sex: Treating RN: 10-11-1935 (87 y.o. [name] Primary Care Crista Nuon: Carol Ada Other Clinician: Referring Blaine Guiffre: Treating Rae Plotner/Extender: Prudencio Burly, Candace Weeks in Treatment: 0 Edema Assessment Assessed: [Left: No] [Right: No] [Left: Edema] [Right: :] Calf Left: Right: Point of Measurement: From Medial Instep 47.7 cm 49 cm Ankle Left: Right: Point of Measurement: From  Medial Instep 26 cm 26.5 cm Vascular Assessment Pulses: Dorsalis Pedis Palpable: [Left:No] [Right:No] Electronic Signature(s) Signed: 05/24/2022 4:17:19 PM By: Adline Peals Entered By: Adline Peals on 05/24/2022 15:15:05 Multi Wound Chart Details -------------------------------------------------------------------------------- [name] ([id number]) [phone].pdf Page 3 of 15 Patient Name: Date of Service: [name] 05/24/2022 2:45 PM Medical Record Number: [id number] Patient Account Number: [id number] Date of Birth/Sex: Treating RN: 04-24-1935 (87 y.o. M) Primary Care Dhanya Bogle: Carol Ada Other Clinician: Referring Elma Shands: Treating Daleena Rotter/Extender: Prudencio Burly, Candace Weeks in Treatment: 0 Vital Signs Height(in): 71 Pulse(bpm): 80 Weight(lbs): 190 Blood Pressure(mmHg): 146/61 Body Mass Index(BMI): 26.5 Temperature(F): 97.4 Respiratory  Rate(breaths/min): 20 Wound Assessments Wound Number: [id number]  Photos: Right Calcaneus Right, Posterior Lower Leg Right, Lateral Lower Leg Wound Location: Pressure Injury Gradually Appeared Gradually Appeared Wounding Event: Pressure Ulcer Lymphedema Lymphedema Primary Etiology: 05/24/2022 05/10/2022 05/09/2022 Date Acquired: 0 0 0 Weeks of Treatment: Open Open Open Wound Status: No No No Wound Recurrence: No Yes Yes Clustered Wound: 0.3x0.3x0.1 11x1.5x0.1 4x0.2x0.1 Measurements L x W x D (cm) 0.071 12.959 0.628 A (cm) : rea 0.007 1.296 0.063 Volume (cm) : Category/Stage II Full Thickness Without Exposed Full Thickness Without Exposed Classification: Support Structures Support Structures Medium Medium Medium Exudate A mount: Serosanguineous Serosanguineous Serosanguineous Exudate Type: red, brown red, brown red, brown Exudate Color: Distinct, outline attached Distinct, outline attached Distinct, outline attached Wound Margin: Small (1-33%) Small (1-33%) Small (1-33%) Granulation A mount: Red Red, Pink Red, Pink Granulation Quality: Large (67-100%) Large (67-100%) Large (67-100%) Necrotic A mount: Eschar, Adherent Slough Eschar, Adherent [place], Adherent Slough Necrotic Tissue: Fat Layer (Subcutaneous Tissue): Yes Fat Layer (Subcutaneous Tissue): Yes Fat Layer (Subcutaneous Tissue): Yes Exposed Structures: Fascia: No Fascia: No Fascia: No Tendon: No Tendon: No Tendon: No Muscle: No Muscle: No Muscle: No Joint: No Joint: No Joint: No Bone: No Bone: No Bone: No None Small (1-33%) Small (1-33%) Epithelialization: Debridement - Excisional Debridement - Selective/Open Wound Debridement - Selective/Open Wound Debridement: Pre-procedure Verification/Time Out 15:27 15:27 15:27 Taken: Lidocaine 4% Topical Solution Lidocaine 4% Topical Solution Lidocaine 4% Topical Solution Pain Control: Subcutaneous, Psychologist, prison and probation services, Psychologist, prison and probation services,  [employer] Tissue Debrided: Skin/Subcutaneous Tissue Non-Viable Tissue Non-Viable Tissue Level: 0.09 6 0.2 Debridement A (sq cm): rea Curette Curette Curette Instrument: Minimum Minimum Minimum Bleeding: Pressure Pressure Pressure Hemostasis A chieved: Procedure was tolerated well Procedure was tolerated well Procedure was tolerated well Debridement Treatment Response: 0.3x0.3x0.1 11x1.5x0.1 4x0.2x0.1 Post Debridement Measurements L x W x D (cm) 0.007 1.296 0.063 Post Debridement Volume: (cm) Category/Stage II N/A N/A Post Debridement Stage: Callus: Yes No Abnormalities Noted No Abnormalities Noted Periwound Skin Texture: No Abnormalities Noted Dry/Scaly: Yes No Abnormalities Noted Periwound Skin Moisture: No Abnormalities Noted Rubor: Yes Rubor: Yes Periwound Skin Color: No Abnormality No Abnormality No Abnormality Temperature: Yes N/A N/A Tenderness on Palpation: Debridement Debridement Compression Therapy Procedures Performed: Debridement Wound Number: [id number] N/A Photos: N/A ANJUAN, MESTON [name] ([id number]) [phone].pdf Page 4 of 15 Left, Lateral Lower Leg Left, Posterior Lower Leg N/A Wound Location: Trauma Gradually Appeared N/A Wounding Event: Lymphedema Lymphedema N/A Primary Etiology: 05/09/2022 05/09/2022 N/A Date Acquired: 0 0 N/A Weeks of Treatment: Open Open N/A Wound Status: No No N/A Wound Recurrence: No No N/A Clustered Wound: 3x2.6x0.1 1x0.7x0.1 N/A Measurements L x W x D (cm) 6.126 0.55 N/A A (cm) : rea 0.613 0.055 N/A Volume (cm) : Full Thickness Without Exposed Full Thickness Without Exposed N/A Classification: Support Structures Support Structures Medium Medium N/A Exudate A  mount: Serosanguineous Serosanguineous N/A Exudate Type: red, brown red, brown N/A Exudate Color: Distinct, outline attached Distinct, outline attached N/A Wound Margin: Small (1-33%) Small (1-33%) N/A Granulation A mount: Red Red,  Pink N/A Granulation Quality: Large (67-100%) Large (67-100%) N/A Necrotic A mount: Eschar, Adherent Slough Eschar, Adherent Slough N/A Necrotic Tissue: Fat Layer (Subcutaneous Tissue): Yes Fat Layer (Subcutaneous Tissue): Yes N/A Exposed Structures: Fascia: No Fascia: No Tendon: No Tendon: No Muscle: No Muscle: No Joint: No Joint: No Bone: No Bone: No None Small (1-33%) N/A Epithelialization: Debridement - Excisional Debridement - Selective/Open Wound N/A Debridement: Pre-procedure Verification/Time Out 15:27 15:27 N/A Taken: Lidocaine 4% Topical Solution Lidocaine 4% Topical Solution N/A Pain Control: Necrotic/Eschar, Subcutaneous, Slough N/A Tissue Debrided: Slough Skin/Subcutaneous Tissue Non-Viable Tissue N/A Level: 7.8 0.7 N/A Debridement A (sq cm): rea Curette Curette N/A Instrument: Minimum Minimum N/A Bleeding: Pressure Pressure N/A Hemostasis Achieved: Debridement Treatment Response: Procedure was tolerated well Procedure was tolerated well N/A Post Debridement Measurements L x 3x2.6x0.1 1x0.7x0.1 N/A W x D (cm) 0.613 0.055 N/A Post Debridement Volume: (cm) N/A N/A N/A Post Debridement Stage: No Abnormalities Noted No Abnormalities Noted N/A Periwound Skin Texture: Dry/Scaly: Yes No Abnormalities Noted N/A Periwound Skin Moisture: Rubor: Yes Rubor: Yes N/A Periwound Skin Color: No Abnormality No Abnormality N/A Temperature: Compression Therapy Debridement N/A Procedures Performed: Debridement Treatment Notes Wound #5 (Calcaneus) Wound Laterality: Right Cleanser Soap and Water Discharge Instruction: May shower and wash wound with dial antibacterial soap and water prior to dressing change. Wound Cleanser Discharge Instruction: Cleanse the wound with wound cleanser prior to applying a clean dressing using gauze sponges, not tissue or cotton balls. Peri-Wound Care Topical Primary Dressing Maxorb Extra Ag+ Alginate Dressing, 2x2  (in/in) Discharge Instruction: Apply to wound bed as instructed Secondary Dressing ALLEVYN Heel 4 1/2in x 5 1/2in / 10.5cm x 13.5cm Discharge Instruction: Apply over primary dressing as directed. Optifoam Non-Adhesive Dressing, 4x4 in Discharge Instruction: Apply over primary dressing as directed. Woven Gauze Sponge, Non-Sterile 4x4 in Discharge Instruction: Apply over primary dressing as directed. [name], [name] ([id number]) [id number] Page 5 of 15 Secured With Compression Wrap Urgo K2, two layer compression system, regular Discharge Instruction: Apply Urgo K2 as directed (alternative to 4 layer compression). Compression Stockings Add-Ons Wound #6 (Lower Leg) Wound Laterality: Right, Posterior Cleanser Soap and Water Discharge Instruction: May shower and wash wound with dial antibacterial soap and water prior to dressing change. Wound Cleanser Discharge Instruction: Cleanse the wound with wound cleanser prior to applying a clean dressing using gauze sponges, not tissue or cotton balls. Peri-Wound Care Sween Lotion (Moisturizing lotion) Discharge Instruction: Apply moisturizing lotion as directed Topical Primary Dressing Maxorb Extra Ag+ Alginate Dressing, 2x2 (in/in) Discharge Instruction: Apply to wound bed as instructed Secondary Dressing ABD Pad, 5x9 Discharge Instruction: Apply over primary dressing as directed. Woven Gauze Sponge, Non-Sterile 4x4 in Discharge Instruction: Apply over primary dressing as directed. Secured With Compression Wrap Urgo K2, two layer compression system, regular Discharge Instruction: Apply Urgo K2 as directed (alternative to 4 layer compression). Compression Stockings Add-Ons Wound #7 (Lower Leg) Wound Laterality: Right, Lateral Cleanser Soap and Water Discharge Instruction: May shower and wash wound with dial antibacterial soap and water prior to dressing change. Wound Cleanser Discharge Instruction: Cleanse  the wound with wound cleanser prior to applying a clean dressing using gauze sponges, not tissue or cotton balls. Peri-Wound Care Sween Lotion (Moisturizing lotion) Discharge Instruction: Apply moisturizing lotion as directed Topical Primary Dressing Maxorb Extra Ag+ Alginate Dressing, 2x2 (  in/in) Discharge Instruction: Apply to wound bed as instructed Secondary Dressing ABD Pad, 5x9 Discharge Instruction: Apply over primary dressing as directed. Woven Gauze Sponge, Non-Sterile 4x4 in Discharge Instruction: Apply over primary dressing as directed. Secured With Compression Wrap Urgo K2, two layer compression system, regular Discharge Instruction: Apply Urgo K2 as directed (alternative to 4 layer compression). [name], [name] ([id number]) [id number] Page 6 of 15 Compression Stockings Add-Ons Wound #8 (Lower Leg) Wound Laterality: Left, Lateral Cleanser Soap and Water Discharge Instruction: May shower and wash wound with dial antibacterial soap and water prior to dressing change. Wound Cleanser Discharge Instruction: Cleanse the wound with wound cleanser prior to applying a clean dressing using gauze sponges, not tissue or cotton balls. Peri-Wound Care Sween Lotion (Moisturizing lotion) Discharge Instruction: Apply moisturizing lotion as directed Topical Primary Dressing Maxorb Extra Ag+ Alginate Dressing, 2x2 (in/in) Discharge Instruction: Apply to wound bed as instructed Secondary Dressing ABD Pad, 5x9 Discharge Instruction: Apply over primary dressing as directed. Woven Gauze Sponge, Non-Sterile 4x4 in Discharge Instruction: Apply over primary dressing as directed. Secured With Compression Wrap Urgo K2, two layer compression system, regular Discharge Instruction: Apply Urgo K2 as directed (alternative to 4 layer compression). Compression Stockings Add-Ons Wound #9 (Lower Leg) Wound Laterality: Left, Posterior Cleanser Soap and  Water Discharge Instruction: May shower and wash wound with dial antibacterial soap and water prior to dressing change. Wound Cleanser Discharge Instruction: Cleanse the wound with wound cleanser prior to applying a clean dressing using gauze sponges, not tissue or cotton balls. Peri-Wound Care Sween Lotion (Moisturizing lotion) Discharge Instruction: Apply moisturizing lotion as directed Topical Primary Dressing Maxorb Extra Ag+ Alginate Dressing, 2x2 (in/in) Discharge Instruction: Apply to wound bed as instructed Secondary Dressing ABD Pad, 5x9 Discharge Instruction: Apply over primary dressing as directed. Woven Gauze Sponge, Non-Sterile 4x4 in Discharge Instruction: Apply over primary dressing as directed. Secured With Compression Wrap Urgo K2, two layer compression system, regular Discharge Instruction: Apply Urgo K2 as directed (alternative to 4 layer compression). Compression Stockings Add-Ons [name], [name] ([id number]) [id number] Page 7 of 15 Electronic Signature(s) Signed: 05/24/2022 4:17:27 PM By: Fredirick Maudlin MD FACS Entered By: Fredirick Maudlin on 05/24/2022 16:17:27 -------------------------------------------------------------------------------- Multi-Disciplinary Care Plan Details Patient Name: Date of Service: [name], [name] 05/24/2022 2:45 PM Medical Record Number: [id number] Patient Account Number: [id number] Date of Birth/Sex: Treating RN: 09/28/1935 (87 y.o. [name] Primary Care Ladislao Cohenour: Carol Ada Other Clinician: Referring Tyrann Donaho: Treating Merly Hinkson/Extender: Lianne Bushy Weeks in Treatment: 0 Active Inactive Abuse / Safety / Falls / Self Care Management Nursing Diagnoses: Impaired physical mobility Potential for falls Goals: Patient/caregiver will verbalize/demonstrate measures taken to improve the patient's personal safety Date Initiated: 05/24/2022 Target Resolution Date:  07/08/2022 Goal Status: Active Patient/caregiver will verbalize/demonstrate measures taken to prevent injury and/or falls Date Initiated: 05/24/2022 Target Resolution Date: 07/08/2022 Goal Status: Active Interventions: Assess fall risk on admission and as needed Notes: Wound/Skin Impairment Nursing Diagnoses: Impaired tissue integrity Knowledge deficit related to ulceration/compromised skin integrity Goals: Patient/caregiver will verbalize understanding of skin care regimen Date Initiated: 05/24/2022 Target Resolution Date: 07/08/2022 Goal Status: Active Interventions: Assess ulceration(s) every visit Treatment Activities: Skin care regimen initiated : 05/24/2022 Topical wound management initiated : 05/24/2022 Notes: Electronic Signature(s) Signed: 05/24/2022 4:17:19 PM By: Adline Peals Entered By: Adline Peals on 05/24/2022 16:04:17 -------------------------------------------------------------------------------- Pain Assessment Details Patient Name: Date of Service: [name] 05/24/2022 2:45 PM Medical Record Number: [id number] Patient Account Number: [id number] Date of Birth/Sex: Treating RN: 10-21-1935 (86  y.o. [name] Primary Care Natayah Warmack: Carol Ada Other Clinician: Referring Eevee Borbon: Treating Lorita Forinash/Extender: Lianne Bushy Weeks in Treatment: 0 AKIR, WILMES [name] ([id number]) [id number] Page 8 of 15 Active Problems Location of Pain Severity and Description of Pain Patient Has Paino Yes Site Locations Rate the pain. Current Pain Level: 9 Worst Pain Level: 9 Least Pain Level: 0 Tolerable Pain Level: 2 Pain Management and Medication Current Pain Management: Electronic Signature(s) Signed: 05/24/2022 4:17:19 PM By: Adline Peals Entered By: Adline Peals on 05/24/2022  15:12:35 -------------------------------------------------------------------------------- Patient/Caregiver Education Details Patient Name: Date of Service: [name] 3/26/2024andnbsp2:45 PM Medical Record Number: [id number] Patient Account Number: [id number] Date of Birth/Gender: Treating RN: 04/30/1935 (87 y.o. [name] Primary Care Physician: Carol Ada Other Clinician: Referring Physician: Treating Physician/Extender: Lianne Bushy Weeks in Treatment: 0 Education Assessment Education Provided To: Patient Education Topics Provided Wound Debridement: Methods: Explain/Verbal Responses: Reinforcements needed, State content correctly Electronic Signature(s) Signed: 05/24/2022 4:17:19 PM By: Adline Peals Entered By: Adline Peals on 05/24/2022 16:04:45 -------------------------------------------------------------------------------- Wound Assessment Details Patient Name: Date of Service: [name] 05/24/2022 2:45 PM Medical Record Number: [id number] Patient Account Number: [id number] Date of Birth/Sex: Treating RN: 10-30-1935 (87 y.o. [name] Primary Care Jeremiah Tarpley: Carol Ada Other Clinician: Referring Christia Coaxum: Treating Rynn Markiewicz/Extender: Prudencio Burly, Hal Hope Weeks in Treatment: 0 Wound Status ALGIS, YAMBAO [name] ([id number]) [id number] Page 9 of 15 Wound Number: 5 Primary Etiology: Pressure Ulcer Wound Location: Right Calcaneus Wound Status: Open Wounding Event: Pressure Injury Date Acquired: 05/24/2022 Weeks Of Treatment: 0 Clustered Wound: No Photos Wound Measurements Length: (cm) 0.3 Width: (cm) 0.3 Depth: (cm) 0.1 Area: (cm) 0.071 Volume: (cm) 0.007 % Reduction in Area: % Reduction in Volume: Epithelialization: None Tunneling: No Undermining: No Wound Description Classification: Category/Stage II Wound Margin: Distinct, outline  attached Exudate Amount: Medium Exudate Type: Serosanguineous Exudate Color: red, brown Foul Odor After Cleansing: No Slough/Fibrino Yes Wound Bed Granulation Amount: Small (1-33%) Exposed Structure Granulation Quality: Red Fascia Exposed: No Necrotic Amount: Large (67-100%) Fat Layer (Subcutaneous Tissue) Exposed: Yes Necrotic Quality: Eschar, Adherent Slough Tendon Exposed: No Muscle Exposed: No Joint Exposed: No Bone Exposed: No Periwound Skin Texture Texture Color No Abnormalities Noted: No No Abnormalities Noted: Yes Callus: Yes Temperature / Pain Temperature: No Abnormality Moisture No Abnormalities Noted: Yes Tenderness on Palpation: Yes Electronic Signature(s) Signed: 05/24/2022 3:23:50 PM By: Worthy Rancher Signed: 05/24/2022 4:17:19 PM By: Adline Peals Entered ByWorthy Rancher on 05/24/2022 15:18:18 -------------------------------------------------------------------------------- Wound Assessment Details Patient Name: Date of Service: [name], [name] 05/24/2022 2:45 PM Medical Record Number: [id number] Patient Account Number: [id number] Date of Birth/Sex: Treating RN: 02-04-1936 (87 y.o. [name] Primary Care [place] Sear[place]: Carol Ada Other Clinician: Referring Cornella Emmer: Treating Venancio Chenier/Extender: Prudencio Burly, Candace Weeks in Treatment: 0 Wound Status Wound Number: 6 Primary Etiology: Lymphedema Wound Location: Right, Posterior Lower Leg Wound Status: Open Wounding Event: Gradually Appeared Date Acquired: 05/10/2022 [name], [name] ([id number]) [phone].pdf Page 10 of 15 Weeks Of Treatment: 0 Clustered Wound: Yes Photos Wound Measurements Length: (cm) 11 Width: (cm) 1.5 Depth: (cm) 0.1 Area: (cm) 12.959 Volume: (cm) 1.296 % Reduction in Area: % Reduction in Volume: Epithelialization: Small (1-33%) Tunneling: No Undermining: No Wound Description Classification: Full Thickness Without  Exposed Support Structures Wound Margin: Distinct, outline attached Exudate Amount: Medium Exudate Type: Serosanguineous Exudate Color: red, brown Foul Odor After Cleansing: No Slough/Fibrino Yes Wound Bed Granulation Amount: Small (1-33%) Exposed Structure Granulation Quality: Red, Pink Fascia Exposed: No  Necrotic Amount: Large (67-100%) Fat Layer (Subcutaneous Tissue) Exposed: Yes Necrotic Quality: Eschar, Adherent Slough Tendon Exposed: No Muscle Exposed: No Joint Exposed: No Bone Exposed: No Periwound Skin Texture Texture Color No Abnormalities Noted: Yes No Abnormalities Noted: No Rubor: Yes Moisture No Abnormalities Noted: No Temperature / Pain Dry / Scaly: Yes Temperature: No Abnormality Treatment Notes Wound #6 (Lower Leg) Wound Laterality: Right, Posterior Cleanser Soap and Water Discharge Instruction: May shower and wash wound with dial antibacterial soap and water prior to dressing change. Wound Cleanser Discharge Instruction: Cleanse the wound with wound cleanser prior to applying a clean dressing using gauze sponges, not tissue or cotton balls. Peri-Wound Care Sween Lotion (Moisturizing lotion) Discharge Instruction: Apply moisturizing lotion as directed Topical Primary Dressing Maxorb Extra Ag+ Alginate Dressing, 2x2 (in/in) Discharge Instruction: Apply to wound bed as instructed Secondary Dressing ABD Pad, 5x9 Discharge Instruction: Apply over primary dressing as directed. Woven Gauze Sponge, Non-Sterile 4x4 in Discharge Instruction: Apply over primary dressing as directed. ARONDE, PREUSS [name] ([id number]) [id number] Page 11 of 15 Secured With Compression Wrap Urgo K2, two layer compression system, regular Discharge Instruction: Apply Urgo K2 as directed (alternative to 4 layer compression). Compression Stockings Add-Ons Electronic Signature(s) Signed: 05/24/2022 3:23:50 PM By: Worthy Rancher Signed: 05/24/2022 4:17:19 PM By:  Adline Peals Entered By: Worthy Rancher on 05/24/2022 15:19:10 -------------------------------------------------------------------------------- Wound Assessment Details Patient Name: Date of Service: [name], [name] 05/24/2022 2:45 PM Medical Record Number: [id number] Patient Account Number: [id number] Date of Birth/Sex: Treating RN: 1935-07-23 (87 y.o. [name] Primary Care Ruthellen Tippy: Carol Ada Other Clinician: Referring Yue Flanigan: Treating Vegas Fritze/Extender: Prudencio Burly, Candace Weeks in Treatment: 0 Wound Status Wound Number: 7 Primary Etiology: Lymphedema Wound Location: Right, Lateral Lower Leg Wound Status: Open Wounding Event: Gradually Appeared Date Acquired: 05/09/2022 Weeks Of Treatment: 0 Clustered Wound: Yes Photos Wound Measurements Length: (cm) 4 Width: (cm) 0.2 Depth: (cm) 0.1 Area: (cm) 0.628 Volume: (cm) 0.063 % Reduction in Area: % Reduction in Volume: Epithelialization: Small (1-33%) Tunneling: No Undermining: No Wound Description Classification: Full Thickness Without Exposed Suppor Wound Margin: Distinct, outline attached Exudate Amount: Medium Exudate Type: Serosanguineous Exudate Color: red, brown t Structures Foul Odor After Cleansing: No Slough/Fibrino Yes Wound Bed Granulation Amount: Small (1-33%) Exposed Structure Granulation Quality: Red, Pink Fascia Exposed: No Necrotic Amount: Large (67-100%) Fat Layer (Subcutaneous Tissue) Exposed: Yes Necrotic Quality: Eschar, Adherent Slough Tendon Exposed: No Muscle Exposed: No Joint Exposed: No Bone Exposed: No Periwound Skin Texture Texture Color Gunn, Emanual [name] ([id number] Page 12 of 15 No Abnormalities Noted: Yes No Abnormalities Noted: No Rubor: Yes Moisture No Abnormalities Noted: Yes Temperature / Pain Temperature: No Abnormality Treatment Notes Wound #7 (Lower Leg) Wound Laterality: Right,  Lateral Cleanser Soap and Water Discharge Instruction: May shower and wash wound with dial antibacterial soap and water prior to dressing change. Wound Cleanser Discharge Instruction: Cleanse the wound with wound cleanser prior to applying a clean dressing using gauze sponges, not tissue or cotton balls. Peri-Wound Care Sween Lotion (Moisturizing lotion) Discharge Instruction: Apply moisturizing lotion as directed Topical Primary Dressing Maxorb Extra Ag+ Alginate Dressing, 2x2 (in/in) Discharge Instruction: Apply to wound bed as instructed Secondary Dressing ABD Pad, 5x9 Discharge Instruction: Apply over primary dressing as directed. Woven Gauze Sponge, Non-Sterile 4x4 in Discharge Instruction: Apply over primary dressing as directed. Secured With Compression Wrap Urgo K2, two layer compression system, regular Discharge Instruction: Apply Urgo K2 as directed (alternative to 4 layer compression). Compression Stockings Add-Ons Electronic Signature(s) Signed: 05/24/2022  3:23:50 PM By: Worthy Rancher Signed: 05/24/2022 4:17:19 PM By: Sabas Sous ByWorthy Rancher on 05/24/2022 15:19:43 -------------------------------------------------------------------------------- Wound Assessment Details Patient Name: Date of Service: [name], Elbie [name]. 05/24/2022 2:45 PM Medical Record Number: [id number] Patient Account Number: [id number] Date of Birth/Sex: Treating RN: 03-14-1935 (87 y.o. [name] Primary Care Socorro Kanitz: Carol Ada Other Clinician: Referring Sharne Linders: Treating Eesha Schmaltz/Extender: Prudencio Burly, Candace Weeks in Treatment: 0 Wound Status Wound Number: 8 Primary Etiology: Lymphedema Wound Location: Left, Lateral Lower Leg Wound Status: Open Wounding Event: Trauma Date Acquired: 05/09/2022 Weeks Of Treatment: 0 Clustered Wound: No Photos [name], [name] ([id number]) [phone].pdf Page 13 of 15 Wound  Measurements Length: (cm) 3 Width: (cm) 2.6 Depth: (cm) 0.1 Area: (cm) 6.126 Volume: (cm) 0.613 % Reduction in Area: % Reduction in Volume: Epithelialization: None Tunneling: No Undermining: No Wound Description Classification: Full Thickness Without Exposed Support Structures Wound Margin: Distinct, outline attached Exudate Amount: Medium Exudate Type: Serosanguineous Exudate Color: red, brown Foul Odor After Cleansing: No Slough/Fibrino Yes Wound Bed Granulation Amount: Small (1-33%) Exposed Structure Granulation Quality: Red Fascia Exposed: No Necrotic Amount: Large (67-100%) Fat Layer (Subcutaneous Tissue) Exposed: Yes Necrotic Quality: Eschar, Adherent Slough Tendon Exposed: No Muscle Exposed: No Joint Exposed: No Bone Exposed: No Periwound Skin Texture Texture Color No Abnormalities Noted: Yes No Abnormalities Noted: No Rubor: Yes Moisture No Abnormalities Noted: Yes Temperature / Pain Temperature: No Abnormality Treatment Notes Wound #8 (Lower Leg) Wound Laterality: Left, Lateral Cleanser Soap and Water Discharge Instruction: May shower and wash wound with dial antibacterial soap and water prior to dressing change. Wound Cleanser Discharge Instruction: Cleanse the wound with wound cleanser prior to applying a clean dressing using gauze sponges, not tissue or cotton balls. Peri-Wound Care Sween Lotion (Moisturizing lotion) Discharge Instruction: Apply moisturizing lotion as directed Topical Primary Dressing Maxorb Extra Ag+ Alginate Dressing, 2x2 (in/in) Discharge Instruction: Apply to wound bed as instructed Secondary Dressing ABD Pad, 5x9 Discharge Instruction: Apply over primary dressing as directed. Woven Gauze Sponge, Non-Sterile 4x4 in Discharge Instruction: Apply over primary dressing as directed. Secured With Compression Wrap Urgo K2, two layer compression system, regular [name], [name] ([id number]) [phone].pdf  Page 14 of 15 Discharge Instruction: Apply Urgo K2 as directed (alternative to 4 layer compression). Compression Stockings Add-Ons Electronic Signature(s) Signed: 05/24/2022 3:23:50 PM By: Worthy Rancher Signed: 05/24/2022 4:17:19 PM By: Adline Peals Entered By: Worthy Rancher on 05/24/2022 15:20:09 -------------------------------------------------------------------------------- Wound Assessment Details Patient Name: Date of Service: [name], Jeremia [name]. 05/24/2022 2:45 PM Medical Record Number: [id number] Patient Account Number: [id number] Date of Birth/Sex: Treating RN: [birth year] (87 y.o. [name] Primary Care Kalum Minner: Carol Ada Other Clinician: Referring Tyreisha Ungar: Treating Mylia Pondexter/Extender: Prudencio Burly, Candace Weeks in Treatment: 0 Wound Status Wound Number: 9 Primary Etiology: Lymphedema Wound Location: Left, Posterior Lower Leg Wound Status: Open Wounding Event: Gradually Appeared Date Acquired: 05/09/2022 Weeks Of Treatment: 0 Clustered Wound: No Photos Wound Measurements Length: (cm) 1 Width: (cm) 0.7 Depth: (cm) 0.1 Area: (cm) 0.55 Volume: (cm) 0.055 % Reduction in Area: % Reduction in Volume: Epithelialization: Small (1-33%) Tunneling: No Undermining: No Wound Description Classification: Full Thickness Without Exposed Support Structures Wound Margin: Distinct, outline attached Exudate Amount: Medium Exudate Type: Serosanguineous Exudate Color: red, brown Foul Odor After Cleansing: No Slough/Fibrino Yes Wound Bed Granulation Amount: Small (1-33%) Exposed Structure Granulation Quality: Red, Pink Fascia Exposed: No Necrotic Amount: Large (67-100%) Fat Layer (Subcutaneous Tissue) Exposed: Yes Necrotic Quality: Eschar, Adherent Slough Tendon Exposed: No Muscle Exposed: No  Joint Exposed: No Bone Exposed: No Periwound Skin Texture Texture Color No Abnormalities Noted: Yes No Abnormalities Noted: No Rubor:  Yes Moisture No Abnormalities Noted: Yes Temperature / Pain [place], Stetson [name] ([id number] Page 15 of 15 Temperature: No Abnormality Treatment Notes Wound #9 (Lower Leg) Wound Laterality: Left, Posterior Cleanser Soap and Water Discharge Instruction: May shower and wash wound with dial antibacterial soap and water prior to dressing change. Wound Cleanser Discharge Instruction: Cleanse the wound with wound cleanser prior to applying a clean dressing using gauze sponges, not tissue or cotton balls. Peri-Wound Care Sween Lotion (Moisturizing lotion) Discharge Instruction: Apply moisturizing lotion as directed Topical Primary Dressing Maxorb Extra Ag+ Alginate Dressing, 2x2 (in/in) Discharge Instruction: Apply to wound bed as instructed Secondary Dressing ABD Pad, 5x9 Discharge Instruction: Apply over primary dressing as directed. Woven Gauze Sponge, Non-Sterile 4x4 in Discharge Instruction: Apply over primary dressing as directed. Secured With Compression Wrap Urgo K2, two layer compression system, regular Discharge Instruction: Apply Urgo K2 as directed (alternative to 4 layer compression). Compression Stockings Add-Ons Electronic Signature(s) Signed: 05/24/2022 3:23:50 PM By: Worthy Rancher Signed: 05/24/2022 4:17:19 PM By: Adline Peals Entered By: Worthy Rancher on 05/24/2022 15:21:10 -------------------------------------------------------------------------------- Vitals Details Patient Name: Date of Service: [name], Leelynd [name]. 05/24/2022 2:45 PM Medical Record Number: [id number] Patient Account Number: [id number] Date of Birth/Sex: Treating RN: [birth year] (87 y.o. [name] Primary Care Chelsea Nusz: Carol Ada Other Clinician: Referring Polette Nofsinger: Treating Catheline Hixon/Extender: Lianne Bushy Weeks in Treatment: 0 Vital Signs Time Taken: 03:11 Temperature (F): 97.4 Height (in): 71 Pulse (bpm):  80 Weight (lbs): 190 Respiratory Rate (breaths/min): 20 Body Mass Index (BMI): 26.5 Blood Pressure (mmHg): 146/61 Reference Range: 80 - 120 mg / dl Electronic Signature(s) Signed: 05/24/2022 4:17:19 PM By: Adline Peals Entered By: Adline Peals on 05/24/2022 15:12:05

## 2022-05-24 NOTE — Progress Notes (Signed)
[name], [name] ([id number]) [id number] Page 1 of 15 Visit Report for 05/24/2022 Chief Complaint Document Details Patient Name: Date of Service: [name], [name] 05/24/2022 2:45 PM Medical Record Number: [id number] Patient Account Number: [id number] Date of Birth/Sex: Treating RN: Aug 11, 1935 (87 y.o. M) Primary Care Provider: Carol Ada Other Clinician: Referring Provider: Treating Provider/Extender: Lianne Bushy Weeks in Treatment: 0 Information Obtained from: Patient Chief Complaint Patient presents for treatment of open ulcers due to venous insufficiency and lymphedema Electronic Signature(s) Signed: 05/24/2022 4:17:38 PM By: Fredirick Maudlin MD FACS Entered By: Fredirick Maudlin on 05/24/2022 16:17:38 -------------------------------------------------------------------------------- Debridement Details Patient Name: Date of Service: [name] 05/24/2022 2:45 PM Medical Record Number: [id number] Patient Account Number: [id number] Date of Birth/Sex: Treating RN: 1935-07-28 (87 y.o. Janyth Contes Primary Care Provider: Carol Ada Other Clinician: Referring Provider: Treating Provider/Extender: Lianne Bushy Weeks in Treatment: 0 Debridement Performed for Assessment: Wound #7 Right,Lateral Lower Leg Performed By: Physician Fredirick Maudlin, MD Debridement Type: Debridement Level of Consciousness (Pre-procedure): Awake and Alert Pre-procedure Verification/Time Out Yes - 15:27 Taken: Start Time: 15:27 Pain Control: Lidocaine 4% T opical Solution T Area Debrided (L x W): otal 1 (cm) x 0.2 (cm) = 0.2 (cm) Tissue and other material debrided: Non-Viable, Eschar, Slough, Slough Level: Non-Viable Tissue Debridement Description: Selective/Open Wound Instrument: Curette Bleeding: Minimum Hemostasis Achieved: Pressure Response to Treatment: Procedure was tolerated well Level of  Consciousness (Post- Awake and Alert procedure): Post Debridement Measurements of Total Wound Length: (cm) 4 Width: (cm) 0.2 Depth: (cm) 0.1 Volume: (cm) 0.063 Character of Wound/Ulcer Post Debridement: Improved Post Procedure Diagnosis Same as Pre-procedure Notes scribed for Dr. Celine Ahr by Adline Peals, RN Electronic Signature(s) Signed: 05/24/2022 4:17:19 PM By: Adline Peals Signed: 05/24/2022 4:30:18 PM By: Fredirick Maudlin MD FACS Entered By: Adline Peals on 05/24/2022 15:27:42 [name], [name] ([id number].pdf Page 2 of 15 -------------------------------------------------------------------------------- Debridement Details Patient Name: Date of Service: [name] 05/24/2022 2:45 PM Medical Record Number: [id number] Patient Account Number: [id number] Date of Birth/Sex: Treating RN: 30-Jul-1935 (87 y.o. Janyth Contes Primary Care Provider: Carol Ada Other Clinician: Referring Provider: Treating Provider/Extender: Lianne Bushy Weeks in Treatment: 0 Debridement Performed for Assessment: Wound #6 Right,Posterior Lower Leg Performed By: Physician Fredirick Maudlin, MD Debridement Type: Debridement Level of Consciousness (Pre-procedure): Awake and Alert Pre-procedure Verification/Time Out Yes - 15:27 Taken: Start Time: 15:27 Pain Control: Lidocaine 4% T opical Solution T Area Debrided (L x W): otal 4 (cm) x 1.5 (cm) = 6 (cm) Tissue and other material debrided: Non-Viable, Eschar, [name] Level: Non-Viable Tissue Debridement Description: Selective/Open Wound Instrument: Curette Bleeding: Minimum Hemostasis Achieved: Pressure Response to Treatment: Procedure was tolerated well Level of Consciousness (Post- Awake and Alert procedure): Post Debridement Measurements of Total Wound Length: (cm) 11 Width: (cm) 1.5 Depth: (cm) 0.1 Volume: (cm) 1.296 Character of Wound/Ulcer  Post Debridement: Improved Post Procedure Diagnosis Same as Pre-procedure Notes scribed for Dr. Celine Ahr by Adline Peals, RN Electronic Signature(s) Signed: 05/24/2022 4:17:19 PM By: Adline Peals Signed: 05/24/2022 4:30:18 PM By: Fredirick Maudlin MD FACS Entered By: Adline Peals on 05/24/2022 15:28:09 -------------------------------------------------------------------------------- Debridement Details Patient Name: Date of Service: [name] 05/24/2022 2:45 PM Medical Record Number: [id number] Patient Account Number: [id number] Date of Birth/Sex: Treating RN: May 11, 1935 (87 y.o. Janyth Contes Primary Care Provider: Carol Ada Other Clinician: Referring Provider: Treating Provider/Extender: Lianne Bushy Weeks in Treatment: 0 Debridement Performed for Assessment: Wound #5 Right Calcaneus Performed By:  Physician Fredirick Maudlin, MD Debridement Type: Debridement Level of Consciousness (Pre-procedure): Awake and Alert Pre-procedure Verification/Time Out Yes - 15:27 Taken: Start Time: 15:27 Pain Control: Lidocaine 4% T opical Solution T Area Debrided (L x W): otal 0.3 (cm) x 0.3 (cm) = 0.09 (cm) Tissue and other material debrided: Non-Viable, Slough, Subcutaneous, Slough Level: Skin/Subcutaneous Tissue Debridement Description: Excisional Instrument: Curette Bleeding: Minimum Hemostasis Achieved: Pressure Response to Treatment: Procedure was tolerated well Level of Consciousness (Post- Awake and Alert procedure): [name], [name] ([id number].pdf Page 3 of 15 Post Debridement Measurements of Total Wound Length: (cm) 0.3 Stage: Category/Stage II Width: (cm) 0.3 Depth: (cm) 0.1 Volume: (cm) 0.007 Character of Wound/Ulcer Post Debridement: Improved Post Procedure Diagnosis Same as Pre-procedure Notes scribed for Dr. Celine Ahr by Adline Peals, RN Electronic Signature(s) Signed:  05/24/2022 4:17:19 PM By: Adline Peals Signed: 05/24/2022 4:30:18 PM By: Fredirick Maudlin MD FACS Entered By: Adline Peals on 05/24/2022 15:28:37 -------------------------------------------------------------------------------- Debridement Details Patient Name: Date of Service: [name] 05/24/2022 2:45 PM Medical Record Number: [id number] Patient Account Number: [id number] Date of Birth/Sex: Treating RN: March 13, 1935 (87 y.o. Janyth Contes Primary Care Provider: Carol Ada Other Clinician: Referring Provider: Treating Provider/Extender: Lianne Bushy Weeks in Treatment: 0 Debridement Performed for Assessment: Wound #8 Left,Lateral Lower Leg Performed By: Physician Fredirick Maudlin, MD Debridement Type: Debridement Level of Consciousness (Pre-procedure): Awake and Alert Pre-procedure Verification/Time Out Yes - 15:27 Taken: Start Time: 15:27 Pain Control: Lidocaine 4% T opical Solution T Area Debrided (L x W): otal 3 (cm) x 2.6 (cm) = 7.8 (cm) Tissue and other material debrided: Non-Viable, Eschar, Slough, Subcutaneous, Slough Level: Skin/Subcutaneous Tissue Debridement Description: Excisional Instrument: Curette Bleeding: Minimum Hemostasis Achieved: Pressure Response to Treatment: Procedure was tolerated well Level of Consciousness (Post- Awake and Alert procedure): Post Debridement Measurements of Total Wound Length: (cm) 3 Width: (cm) 2.6 Depth: (cm) 0.1 Volume: (cm) 0.613 Character of Wound/Ulcer Post Debridement: Improved Post Procedure Diagnosis Same as Pre-procedure Notes scribed for Dr. Celine Ahr by Adline Peals, RN Electronic Signature(s) Signed: 05/24/2022 4:17:19 PM By: Adline Peals Signed: 05/24/2022 4:30:18 PM By: Fredirick Maudlin MD FACS Entered By: Adline Peals on 05/24/2022 15:30:15 -------------------------------------------------------------------------------- Debridement Details Patient  Name: Date of Service: [name] 05/24/2022 2:45 PM Brendia Sacks [name] ([id number].pdf Page 4 of 15 Medical Record Number: [id number] Patient Account Number: [id number] Date of Birth/Sex: Treating RN: October 26, 1935 (87 y.o. Janyth Contes Primary Care Provider: Carol Ada Other Clinician: Referring Provider: Treating Provider/Extender: Lianne Bushy Weeks in Treatment: 0 Debridement Performed for Assessment: Wound #9 Left,Posterior Lower Leg Performed By: Physician Fredirick Maudlin, MD Debridement Type: Debridement Level of Consciousness (Pre-procedure): Awake and Alert Pre-procedure Verification/Time Out Yes - 15:27 Taken: Start Time: 15:27 Pain Control: Lidocaine 4% T opical Solution T Area Debrided (L x W): otal 1 (cm) x 0.7 (cm) = 0.7 (cm) Tissue and other material debrided: Non-Viable, [place] Level: Non-Viable Tissue Debridement Description: Selective/Open Wound Instrument: Curette Bleeding: Minimum Hemostasis Achieved: Pressure Response to Treatment: Procedure was tolerated well Level of Consciousness (Post- Awake and Alert procedure): Post Debridement Measurements of Total Wound Length: (cm) 1 Width: (cm) 0.7 Depth: (cm) 0.1 Volume: (cm) 0.055 Character of Wound/Ulcer Post Debridement: Improved Post Procedure Diagnosis Same as Pre-procedure Notes scribed for Dr. Celine Ahr by Adline Peals, RN Electronic Signature(s) Signed: 05/24/2022 4:17:19 PM By: Adline Peals Signed: 05/24/2022 4:30:18 PM By: Fredirick Maudlin MD FACS Entered By: Adline Peals on 05/24/2022 15:30:49 -------------------------------------------------------------------------------- HPI Details Patient Name: Date of  Service: [name], [name] 05/24/2022 2:45 PM Medical Record Number: [id number] Patient Account Number: [id number] Date of Birth/Sex: Treating RN: 10-08-1935 (87 y.o. M) Primary Care  Provider: Carol Ada Other Clinician: Referring Provider: Treating Provider/Extender: Lianne Bushy Weeks in Treatment: 0 History of Present Illness HPI Description: ADMISSION This is an 87 year old man with a past medical history significant for congestive heart failure, CKD stage III, Parkinson's disease, venous insufficiency, pulmonary hypertension, chronic A-fib/a flutter on Eliquis, coronary artery disease, and lymphedema. He has been followed in the vascular surgery clinic by Dr. Scot Dock. Per Dr. Nicole Cella last note, lymphedema pumps have been ordered, but he has not yet received them. He referred the patient to the wound care clinic for closer management of his lymphedema and venous ulcers. Venous reflux studies have been performed and unfortunately, the reflux is deep and therefore not amenable to laser ablation. ABIs in clinic today were noncompressible, but he had excellent Doppler signals. He has a small wound on his left anterior tibial surface with a bit of eschar present. He has another small wound on his right anterior tibial surface that is quite clean. He has a larger superficial ulcer on his right posterior calf that has a small amount of slough present. 04/07/2022: The only remaining open wounds are a small superficial lesion on his right posterior calf and a small ulcer on his left anterior tibial surface. They are both clean without any slough or eschar accumulation. Edema control is good. 04/15/2022: His wounds are healed. READMISSION 05/24/2022 He returns today with multiple open wounds, most of which are from lack of understanding and compliance with the need to wear his juxta lite stockings throughout the day in addition to using his lymphedema pumps. He has been just using his lymphedema pumps without consistent compression garment use. He also struck his left lateral leg on a car door and has a wound related to this. He has multiple small wounds on  his lateral right leg and bilateral posterior calves. He also has an ulcer on his plantar right calcaneus. There is slough and eschar on all surfaces. His edema is completely uncontrolled. DJON, HARTZOG [name] ([id number]) [id number] Page 5 of 15 Electronic Signature(s) Signed: 05/24/2022 4:19:52 PM By: Fredirick Maudlin MD FACS Entered By: Fredirick Maudlin on 05/24/2022 16:19:52 -------------------------------------------------------------------------------- Physical Exam Details Patient Name: Date of Service: [name] 05/24/2022 2:45 PM Medical Record Number: [id number] Patient Account Number: [id number] Date of Birth/Sex: Treating RN: 1935-06-24 (87 y.o. M) Primary Care Provider: Carol Ada Other Clinician: Referring Provider: Treating Provider/Extender: Prudencio Burly, Candace Weeks in Treatment: 0 Constitutional Slightly hypertensive. . . . no acute distress. Respiratory Normal work of breathing on room air. Notes 05/24/2022: He returns today with multiple open wounds. He struck his left lateral leg on a car door and has a wound related to this. He has multiple small wounds on his lateral right leg and bilateral posterior calves. He also has an ulcer on his plantar right calcaneus. There is slough and eschar on all surfaces. His edema is completely uncontrolled. Electronic Signature(s) Signed: 05/24/2022 4:22:16 PM By: Fredirick Maudlin MD FACS Entered By: Fredirick Maudlin on 05/24/2022 16:22:15 -------------------------------------------------------------------------------- Physician Orders Details Patient Name: Date of Service: [name] 05/24/2022 2:45 PM Medical Record Number: [id number] Patient Account Number: [id number] Date of Birth/Sex: Treating RN: 09/01/1935 (87 y.o. Janyth Contes Primary Care Provider: Carol Ada Other Clinician: Referring Provider: Treating Provider/Extender: Prudencio Burly, Candace Weeks in Treatment: 0 Verbal /  Phone Orders: No Diagnosis Coding ICD-10 Coding Code Description [id number] Non-pressure chronic ulcer of other part of right lower leg with fat layer exposed L97.212 Non-pressure chronic ulcer of right calf with fat layer exposed L97.822 Non-pressure chronic ulcer of other part of left lower leg with fat layer exposed L97.222 Non-pressure chronic ulcer of left calf with fat layer exposed L97.412 Non-pressure chronic ulcer of right heel and midfoot with fat layer exposed I89.0 Lymphedema, not elsewhere classified I87.2 Venous insufficiency (chronic) (peripheral) G20.C Parkinsonism, unspecified N18.30 Chronic kidney disease, stage 3 unspecified [id number] Chronic diastolic (congestive) heart failure Follow-up Appointments ppointment in 1 week. - Dr. Celine Ahr RM 1 Return A Anesthetic (In clinic) Topical Lidocaine 4% applied to wound bed Bathing/ Shower/ Hygiene May shower with protection but do not get wound dressing(s) wet. Protect dressing(s) with water repellant cover (for example, large plastic bag) or a cast cover and may then take shower. - may purchase cast protector at [facility], medical supply store EAIN, LANSER [name] ([id number]) [id number] Page 6 of 15 Edema Control - Lymphedema / SCD / Other Bilateral Lower Extremities Lymphedema Pumps. Use Lymphedema pumps on leg(s) 2-3 times a day for 45-60 minutes. If wearing any wraps or hose, do not remove them. Continue exercising as instructed. Elevate legs to the level of the heart or above for 30 minutes daily and/or when sitting for 3-4 times a day throughout the day. - whenever sitting Avoid standing for long periods of time. Exercise regularly Wound Treatment Wound #5 - Calcaneus Wound Laterality: Right Cleanser: Soap and Water 1 x Per Week/30 Days Discharge Instructions: May shower and wash wound with dial antibacterial soap and water prior to  dressing change. Cleanser: Wound Cleanser 1 x Per Week/30 Days Discharge Instructions: Cleanse the wound with wound cleanser prior to applying a clean dressing using gauze sponges, not tissue or cotton balls. Prim Dressing: Maxorb Extra Ag+ Alginate Dressing, 2x2 (in/in) 1 x Per Week/30 Days ary Discharge Instructions: Apply to wound bed as instructed Secondary Dressing: ALLEVYN Heel 4 1/2in x 5 1/2in / 10.5cm x 13.5cm 1 x Per Week/30 Days Discharge Instructions: Apply over primary dressing as directed. Secondary Dressing: Optifoam Non-Adhesive Dressing, 4x4 in 1 x Per Week/30 Days Discharge Instructions: Apply over primary dressing as directed. Secondary Dressing: Woven Gauze Sponge, Non-Sterile 4x4 in 1 x Per Week/30 Days Discharge Instructions: Apply over primary dressing as directed. Compression Wrap: Urgo K2, two layer compression system, regular 1 x Per Week/30 Days Discharge Instructions: Apply Urgo K2 as directed (alternative to 4 layer compression). Wound #6 - Lower Leg Wound Laterality: Right, Posterior Cleanser: Soap and Water 1 x Per Week/30 Days Discharge Instructions: May shower and wash wound with dial antibacterial soap and water prior to dressing change. Cleanser: Wound Cleanser 1 x Per Week/30 Days Discharge Instructions: Cleanse the wound with wound cleanser prior to applying a clean dressing using gauze sponges, not tissue or cotton balls. Peri-Wound Care: Sween Lotion (Moisturizing lotion) 1 x Per Week/30 Days Discharge Instructions: Apply moisturizing lotion as directed Prim Dressing: Maxorb Extra Ag+ Alginate Dressing, 2x2 (in/in) 1 x Per Week/30 Days ary Discharge Instructions: Apply to wound bed as instructed Secondary Dressing: ABD Pad, 5x9 1 x Per Week/30 Days Discharge Instructions: Apply over primary dressing as directed. Secondary Dressing: Woven Gauze Sponge, Non-Sterile 4x4 in 1 x Per Week/30 Days Discharge Instructions: Apply over primary dressing as  directed. Compression Wrap: Urgo K2, two layer compression system, regular 1 x Per Week/30 Days Discharge Instructions: Apply Urgo K2  as directed (alternative to 4 layer compression). Wound #7 - Lower Leg Wound Laterality: Right, Lateral Cleanser: Soap and Water 1 x Per Week/30 Days Discharge Instructions: May shower and wash wound with dial antibacterial soap and water prior to dressing change. Cleanser: Wound Cleanser 1 x Per Week/30 Days Discharge Instructions: Cleanse the wound with wound cleanser prior to applying a clean dressing using gauze sponges, not tissue or cotton balls. Peri-Wound Care: Sween Lotion (Moisturizing lotion) 1 x Per Week/30 Days Discharge Instructions: Apply moisturizing lotion as directed Prim Dressing: Maxorb Extra Ag+ Alginate Dressing, 2x2 (in/in) 1 x Per Week/30 Days ary Discharge Instructions: Apply to wound bed as instructed Secondary Dressing: ABD Pad, 5x9 1 x Per Week/30 Days Discharge Instructions: Apply over primary dressing as directed. Secondary Dressing: Woven Gauze Sponge, Non-Sterile 4x4 in 1 x Per Week/30 Days Discharge Instructions: Apply over primary dressing as directed. Compression Wrap: Urgo K2, two layer compression system, regular 1 x Per Week/30 Days Discharge Instructions: Apply Urgo K2 as directed (alternative to 4 layer compression). Wound #8 - Lower Leg Wound Laterality: Left, Lateral [place]land, Aldana Aniketh [name] ([id number]) [id number] Page 7 of 15 Cleanser: Soap and Water 1 x Per Week/30 Days Discharge Instructions: May shower and wash wound with dial antibacterial soap and water prior to dressing change. Cleanser: Wound Cleanser 1 x Per Week/30 Days Discharge Instructions: Cleanse the wound with wound cleanser prior to applying a clean dressing using gauze sponges, not tissue or cotton balls. Peri-Wound Care: Sween Lotion (Moisturizing lotion) 1 x Per Week/30 Days Discharge Instructions: Apply moisturizing lotion  as directed Prim Dressing: Maxorb Extra Ag+ Alginate Dressing, 2x2 (in/in) 1 x Per Week/30 Days ary Discharge Instructions: Apply to wound bed as instructed Secondary Dressing: ABD Pad, 5x9 1 x Per Week/30 Days Discharge Instructions: Apply over primary dressing as directed. Secondary Dressing: Woven Gauze Sponge, Non-Sterile 4x4 in 1 x Per Week/30 Days Discharge Instructions: Apply over primary dressing as directed. Compression Wrap: Urgo K2, two layer compression system, regular 1 x Per Week/30 Days Discharge Instructions: Apply Urgo K2 as directed (alternative to 4 layer compression). Wound #9 - Lower Leg Wound Laterality: Left, Posterior Cleanser: Soap and Water 1 x Per Week/30 Days Discharge Instructions: May shower and wash wound with dial antibacterial soap and water prior to dressing change. Cleanser: Wound Cleanser 1 x Per Week/30 Days Discharge Instructions: Cleanse the wound with wound cleanser prior to applying a clean dressing using gauze sponges, not tissue or cotton balls. Peri-Wound Care: Sween Lotion (Moisturizing lotion) 1 x Per Week/30 Days Discharge Instructions: Apply moisturizing lotion as directed Prim Dressing: Maxorb Extra Ag+ Alginate Dressing, 2x2 (in/in) 1 x Per Week/30 Days ary Discharge Instructions: Apply to wound bed as instructed Secondary Dressing: ABD Pad, 5x9 1 x Per Week/30 Days Discharge Instructions: Apply over primary dressing as directed. Secondary Dressing: Woven Gauze Sponge, Non-Sterile 4x4 in 1 x Per Week/30 Days Discharge Instructions: Apply over primary dressing as directed. Compression Wrap: Urgo K2, two layer compression system, regular 1 x Per Week/30 Days Discharge Instructions: Apply Urgo K2 as directed (alternative to 4 layer compression). Patient Medications llergies: NSAIDS (Non-Steroidal Anti-Inflammatory Drug) A Notifications Medication Indication Start End 05/24/2022 lidocaine DOSE topical 4 % cream - cream topical Electronic  Signature(s) Signed: 05/24/2022 4:30:18 PM By: Fredirick Maudlin MD FACS Previous Signature: 05/24/2022 4:17:19 PM Version By: Adline Peals Entered By: Fredirick Maudlin on 05/24/2022 16:22:31 -------------------------------------------------------------------------------- Problem List Details Patient Name: Date of Service: [name] 05/24/2022 2:45 PM Medical Record Number:  [id number] Patient Account Number: [id number] Date of Birth/Sex: Treating RN: Nov 10, 1935 (87 y.o. M) Primary Care Provider: Carol Ada Other Clinician: Referring Provider: Treating Provider/Extender: Lianne Bushy Weeks in Treatment: 0 Active Problems ICD-10 Encounter Code Description Active Date MDM ASHELY, PERSSON ([id number]) [phone].pdf Page 8 of 15 Code Description Active Date MDM Diagnosis L97.812 Non-pressure chronic ulcer of other part of right lower leg with fat layer 05/24/2022 No Yes exposed L97.212 Non-pressure chronic ulcer of right calf with fat layer exposed 05/24/2022 No Yes L97.822 Non-pressure chronic ulcer of other part of left lower leg with fat layer exposed3/26/2024 No Yes L97.222 Non-pressure chronic ulcer of left calf with fat layer exposed 05/24/2022 No Yes L97.412 Non-pressure chronic ulcer of right heel and midfoot with fat layer exposed 05/24/2022 No Yes I89.0 Lymphedema, not elsewhere classified 05/24/2022 No Yes I87.2 Venous insufficiency (chronic) (peripheral) 05/24/2022 No Yes G20.C Parkinsonism, unspecified 05/24/2022 No Yes N18.30 Chronic kidney disease, stage 3 unspecified 05/24/2022 No Yes [id number] Chronic diastolic (congestive) heart failure 05/24/2022 No Yes Inactive Problems Resolved Problems Electronic Signature(s) Signed: 05/24/2022 4:17:17 PM By: Fredirick Maudlin MD FACS Entered By: Fredirick Maudlin on 05/24/2022 16:17:16 -------------------------------------------------------------------------------- Progress  Note Details Patient Name: Date of Service: [name] 05/24/2022 2:45 PM Medical Record Number: [id number] Patient Account Number: [id number] Date of Birth/Sex: Treating RN: 06/05/1935 (87 y.o. M) Primary Care Provider: Carol Ada Other Clinician: Referring Provider: Treating Provider/Extender: Lianne Bushy Weeks in Treatment: 0 Subjective Chief Complaint Information obtained from Patient Patient presents for treatment of open ulcers due to venous insufficiency and lymphedema History of Present Illness (HPI) ADMISSION This is an 87 year old man with a past medical history significant for congestive heart failure, CKD stage III, Parkinson's disease, venous insufficiency, pulmonary hypertension, chronic A-fib/a flutter on Eliquis, coronary artery disease, and lymphedema. He has been followed in the vascular surgery clinic by Dr. Scot Dock. Per Dr. Nicole Cella last note, lymphedema pumps have been ordered, but he has not yet received them. He referred the patient to the wound care clinic for closer management of his lymphedema and venous ulcers. Venous reflux studies have been performed and unfortunately, the reflux is deep and therefore not amenable to laser ablation. ABIs in clinic today were noncompressible, but he had excellent Doppler signals. DEAKYN, [place] [name] ([id number]) [id number] Page 9 of 15 He has a small wound on his left anterior tibial surface with a bit of eschar present. He has another small wound on his right anterior tibial surface that is quite clean. He has a larger superficial ulcer on his right posterior calf that has a small amount of slough present. 04/07/2022: The only remaining open wounds are a small superficial lesion on his right posterior calf and a small ulcer on his left anterior tibial surface. They are both clean without any slough or eschar accumulation. Edema control is good. 04/15/2022: His wounds are  healed. READMISSION 05/24/2022 He returns today with multiple open wounds, most of which are from lack of understanding and compliance with the need to wear his juxta lite stockings throughout the day in addition to using his lymphedema pumps. He has been just using his lymphedema pumps without consistent compression garment use. He also struck his left lateral leg on a car door and has a wound related to this. He has multiple small wounds on his lateral right leg and bilateral posterior calves. He also has an ulcer on his plantar right calcaneus. There is slough and eschar on all surfaces. His edema is  completely uncontrolled. Patient History Information obtained from Patient, Caregiver, Chart. Family History Heart Disease - Father, Stroke, No family history of Cancer, Diabetes, Hereditary Spherocytosis, Hypertension, Kidney Disease, Lung Disease, Seizures, Thyroid Problems, Tuberculosis. Social History Never smoker, Marital Status - Married, Alcohol Use - Daily - 2 cocktails per day, Drug Use - No History, Caffeine Use - Daily. Medical History Eyes Patient has history of Cataracts - bil extractions Denies history of Glaucoma, Optic Neuritis Hematologic/Lymphatic Patient has history of Lymphedema - lower legs Cardiovascular Patient has history of Arrhythmia - afib, Congestive Heart Failure, Coronary Artery Disease, Hypertension Endocrine Denies history of Type I Diabetes, Type II Diabetes Genitourinary Denies history of End Stage Renal Disease Integumentary (Skin) Denies history of History of Burn Musculoskeletal Patient has history of Osteoarthritis - both knees Denies history of Gout, Rheumatoid Arthritis, Osteomyelitis Oncologic Patient has history of Received Radiation - seed implants Denies history of Received Chemotherapy Psychiatric Patient has history of Confinement Anxiety Denies history of Anorexia/bulimia Hospitalization/Surgery History - left femur IM nail. - right  shoulder melanoma excision. - cardioversion. - hemorrhoid surgery. - cardiac anfioplasty. - coronary atherectomy. - bil cataract extraction. - insertion prostate radiation seed. - lap cholecystectomy. - inguinal hernia repair. Medical A Surgical History Notes nd Respiratory elevated hemidiaphragm Cardiovascular pulmonary hypertension, mitral valve regurgitation, dilated cardiomyopathy Genitourinary CKD stage 3 Neurologic parkinson's Oncologic skin CA removed, prostate CA Objective Constitutional Slightly hypertensive. no acute distress. Vitals Time Taken: 3:11 AM, Height: 71 in, Weight: 190 lbs, BMI: 26.5, Temperature: 97.4 F, Pulse: 80 bpm, Respiratory Rate: 20 breaths/min, Blood Pressure: 146/61 mmHg. Respiratory Normal work of breathing on room air. General Notes: 05/24/2022: He returns today with multiple open wounds. He struck his left lateral leg on a car door and has a wound related to this. He has multiple small wounds on his lateral right leg and bilateral posterior calves. He also has an ulcer on his plantar right calcaneus. There is slough and eschar on all [name], [name] ([id number]) [id number] Page 10 of 15 surfaces. His edema is completely uncontrolled. Integumentary (Hair, Skin) Wound #5 status is Open. Original cause of wound was Pressure Injury. The date acquired was: 05/24/2022. The wound is located on the Right Calcaneus. The wound measures 0.3cm length x 0.3cm width x 0.1cm depth; 0.071cm^2 area and 0.007cm^3 volume. There is Fat Layer (Subcutaneous Tissue) exposed. There is no tunneling or undermining noted. There is a medium amount of serosanguineous drainage noted. The wound margin is distinct with the outline attached to the wound base. There is small (1-33%) red granulation within the wound bed. There is a large (67-100%) amount of necrotic tissue within the wound bed including Eschar and Adherent Slough. The periwound skin  appearance had no abnormalities noted for moisture. The periwound skin appearance had no abnormalities noted for color. The periwound skin appearance exhibited: Callus. Periwound temperature was noted as No Abnormality. The periwound has tenderness on palpation. Wound #6 status is Open. Original cause of wound was Gradually Appeared. The date acquired was: 05/10/2022. The wound is located on the Right,Posterior Lower Leg. The wound measures 11cm length x 1.5cm width x 0.1cm depth; 12.959cm^2 area and 1.296cm^3 volume. There is Fat Layer (Subcutaneous Tissue) exposed. There is no tunneling or undermining noted. There is a medium amount of serosanguineous drainage noted. The wound margin is distinct with the outline attached to the wound base. There is small (1-33%) red, pink granulation within the wound bed. There is a large (67-100%) amount of necrotic tissue within  the wound bed including Eschar and Adherent Slough. The periwound skin appearance had no abnormalities noted for texture. The periwound skin appearance exhibited: Dry/Scaly, Rubor. Periwound temperature was noted as No Abnormality. Wound #7 status is Open. Original cause of wound was Gradually Appeared. The date acquired was: 05/09/2022. The wound is located on the Right,Lateral Lower Leg. The wound measures 4cm length x 0.2cm width x 0.1cm depth; 0.628cm^2 area and 0.063cm^3 volume. There is Fat Layer (Subcutaneous Tissue) exposed. There is no tunneling or undermining noted. There is a medium amount of serosanguineous drainage noted. The wound margin is distinct with the outline attached to the wound base. There is small (1-33%) red, pink granulation within the wound bed. There is a large (67-100%) amount of necrotic tissue within the wound bed including Eschar and Adherent Slough. The periwound skin appearance had no abnormalities noted for texture. The periwound skin appearance had no abnormalities noted for moisture. The periwound skin  appearance exhibited: Rubor. Periwound temperature was noted as No Abnormality. Wound #8 status is Open. Original cause of wound was Trauma. The date acquired was: 05/09/2022. The wound is located on the Left,Lateral Lower Leg. The wound measures 3cm length x 2.6cm width x 0.1cm depth; 6.126cm^2 area and 0.613cm^3 volume. There is Fat Layer (Subcutaneous Tissue) exposed. There is no tunneling or undermining noted. There is a medium amount of serosanguineous drainage noted. The wound margin is distinct with the outline attached to the wound base. There is small (1-33%) red granulation within the wound bed. There is a large (67-100%) amount of necrotic tissue within the wound bed including Eschar and Adherent Slough. The periwound skin appearance had no abnormalities noted for texture. The periwound skin appearance had no abnormalities noted for moisture. The periwound skin appearance exhibited: Rubor. Periwound temperature was noted as No Abnormality. Wound #9 status is Open. Original cause of wound was Gradually Appeared. The date acquired was: 05/09/2022. The wound is located on the Left,Posterior Lower Leg. The wound measures 1cm length x 0.7cm width x 0.1cm depth; 0.55cm^2 area and 0.055cm^3 volume. There is Fat Layer (Subcutaneous Tissue) exposed. There is no tunneling or undermining noted. There is a medium amount of serosanguineous drainage noted. The wound margin is distinct with the outline attached to the wound base. There is small (1-33%) red, pink granulation within the wound bed. There is a large (67-100%) amount of necrotic tissue within the wound bed including Eschar and Adherent Slough. The periwound skin appearance had no abnormalities noted for texture. The periwound skin appearance had no abnormalities noted for moisture. The periwound skin appearance exhibited: Rubor. Periwound temperature was noted as No Abnormality. 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 There was a Excisional Skin/Subcutaneous Tissue Debridement with a total area of 0.09 sq cm performed by Fredirick Maudlin, MD. With the following instrument(s): Curette to remove Non-Viable tissue/material. Material removed includes Subcutaneous Tissue and Slough and after achieving pain control using Lidocaine 4% T  opical Solution. No specimens were taken. A time out was conducted at 15:27, prior to the start of the procedure. A Minimum amount of bleeding was controlled with Pressure. The procedure was tolerated well. Post Debridement Measurements: 0.3cm length x 0.3cm width x 0.1cm depth; 0.007cm^3 volume. Post debridement Stage noted as Category/Stage II. Character of Wound/Ulcer Post Debridement is improved. Post procedure Diagnosis Wound #5: Same as Pre-Procedure General Notes: scribed for Dr. Celine Ahr by Adline Peals, RN. Wound #6 Pre-procedure diagnosis of Wound #6 is a Lymphedema located on the Right,Posterior Lower Leg . There was a Selective/Open Wound Non-Viable Tissue Debridement with a total area of 6 sq cm performed by Fredirick Maudlin, MD. With the following instrument(s): Curette to remove Non-Viable tissue/material. Material removed includes Eschar and Slough and after achieving pain control using Lidocaine 4% T opical Solution. No specimens  were taken. A time out was conducted at 15:27, prior to the start of the procedure. A Minimum amount of bleeding was controlled with Pressure. The procedure was tolerated well. Post Debridement Measurements: 11cm length x 1.5cm width x 0.1cm depth; 1.296cm^3 volume. Character of Wound/Ulcer Post Debridement is improved. Post procedure Diagnosis Wound #6: Same as Pre-Procedure General Notes: scribed for Dr. Celine Ahr by Adline Peals, RN. Wound #7 Pre-procedure diagnosis of Wound #7 is a Lymphedema located on the Right,Lateral Lower Leg . There was a Selective/Open Wound Non-Viable Tissue Debridement with a total area of 0.2 sq cm performed by Fredirick Maudlin, MD. With the following instrument(s): Curette to remove Non-Viable tissue/material. Material removed includes Eschar and Slough and after achieving pain control using Lidocaine 4% Topical Solution. No specimens were taken. A time out was conducted at 15:27, prior to the start of the procedure. A Minimum amount of bleeding was controlled with Pressure. The procedure was [name], [name] ([id number]) [phone] Page 11 of 15 tolerated well. Post Debridement Measurements: 4cm length x 0.2cm width x 0.1cm depth; 0.063cm^3 volume. Character of Wound/Ulcer Post Debridement is improved. Post procedure Diagnosis Wound #7: Same as Pre-Procedure General Notes: scribed for Dr. Celine Ahr by Adline Peals, RN. Pre-procedure diagnosis of Wound #7 is a Lymphedema located on the Right,Lateral Lower Leg . There was a Double Layer Compression Therapy Procedure by Adline Peals, RN. Post procedure Diagnosis Wound #7: Same as Pre-Procedure Wound #8 Pre-procedure diagnosis of Wound #8 is a Lymphedema located on the Left,Lateral Lower Leg . There was a Excisional Skin/Subcutaneous Tissue Debridement with a total area of 7.8 sq cm performed by Fredirick Maudlin, MD. With the following instrument(s): Curette to remove  Non-Viable tissue/material. Material removed includes Eschar, Subcutaneous Tissue, and Slough after achieving pain control using Lidocaine 4% T opical Solution. No specimens were taken. A time out was conducted at 15:27, prior to the start of the procedure. A Minimum amount of bleeding was controlled with Pressure. The procedure was tolerated well. Post Debridement Measurements: 3cm length x 2.6cm width x 0.1cm depth; 0.613cm^3 volume. Character of Wound/Ulcer Post Debridement is improved. Post procedure Diagnosis Wound #8: Same as Pre-Procedure General Notes: scribed for Dr. Celine Ahr by Adline Peals, RN. Pre-procedure diagnosis of Wound #8 is a Lymphedema located on the Left,Lateral Lower Leg . There was a Double Layer Compression Therapy Procedure by Adline Peals, RN. Post procedure Diagnosis Wound #8: Same as Pre-Procedure Wound #9 Pre-procedure diagnosis of Wound #9 is a Lymphedema located on the Left,Posterior Lower Leg . There was a Selective/Open Wound Non-Viable Tissue Debridement with a total area of 0.7 sq cm performed by Fredirick Maudlin, MD. With  the following instrument(s): Curette to remove Non-Viable tissue/material. Material removed includes Slough after achieving pain control using Lidocaine 4% Topical Solution. No specimens were taken. A time out was conducted at 15:27, prior to the start of the procedure. A Minimum amount of bleeding was controlled with Pressure. The procedure was tolerated well. Post Debridement Measurements: 1cm length x 0.7cm width x 0.1cm depth; 0.055cm^3 volume. Character of Wound/Ulcer Post Debridement is improved. Post procedure Diagnosis Wound #9: Same as Pre-Procedure General Notes: scribed for Dr. Celine Ahr by Adline Peals, RN. Plan Follow-up Appointments: Return Appointment in 1 week. - Dr. Celine Ahr RM 1 Anesthetic: (In clinic) Topical Lidocaine 4% applied to wound bed Bathing/ Shower/ Hygiene: May shower with protection but do not get  wound dressing(s) wet. Protect dressing(s) with water repellant cover (for example, large plastic bag) or a cast cover and may then take shower. - may purchase cast protector at [facility], medical supply store Edema Control - Lymphedema / SCD / Other: Lymphedema Pumps. Use Lymphedema pumps on leg(s) 2-3 times a day for 45-60 minutes. If wearing any wraps or hose, do not remove them. Continue exercising as instructed. Elevate legs to the level of the heart or above for 30 minutes daily and/or when sitting for 3-4 times a day throughout the day. - whenever sitting Avoid standing for long periods of time. Exercise regularly The following medication(s) was prescribed: lidocaine topical 4 % cream cream topical was prescribed at facility WOUND #5: - Calcaneus Wound Laterality: Right Cleanser: Soap and Water 1 x Per Week/30 Days Discharge Instructions: May shower and wash wound with dial antibacterial soap and water prior to dressing change. Cleanser: Wound Cleanser 1 x Per Week/30 Days Discharge Instructions: Cleanse the wound with wound cleanser prior to applying a clean dressing using gauze sponges, not tissue or cotton balls. Prim Dressing: Maxorb Extra Ag+ Alginate Dressing, 2x2 (in/in) 1 x Per Week/30 Days ary Discharge Instructions: Apply to wound bed as instructed Secondary Dressing: ALLEVYN Heel 4 1/2in x 5 1/2in / 10.5cm x 13.5cm 1 x Per Week/30 Days Discharge Instructions: Apply over primary dressing as directed. Secondary Dressing: Optifoam Non-Adhesive Dressing, 4x4 in 1 x Per Week/30 Days Discharge Instructions: Apply over primary dressing as directed. Secondary Dressing: Woven Gauze Sponge, Non-Sterile 4x4 in 1 x Per Week/30 Days Discharge Instructions: Apply over primary dressing as directed. Com pression Wrap: Urgo K2, two layer compression system, regular 1 x Per Week/30 Days Discharge Instructions: Apply Urgo K2 as directed (alternative to 4 layer compression). WOUND #6: -  Lower Leg Wound Laterality: Right, Posterior Cleanser: Soap and Water 1 x Per Week/30 Days Discharge Instructions: May shower and wash wound with dial antibacterial soap and water prior to dressing change. Cleanser: Wound Cleanser 1 x Per Week/30 Days Discharge Instructions: Cleanse the wound with wound cleanser prior to applying a clean dressing using gauze sponges, not tissue or cotton balls. Peri-Wound Care: Sween Lotion (Moisturizing lotion) 1 x Per Week/30 Days Discharge Instructions: Apply moisturizing lotion as directed Prim Dressing: Maxorb Extra Ag+ Alginate Dressing, 2x2 (in/in) 1 x Per Week/30 Days ary Discharge Instructions: Apply to wound bed as instructed Secondary Dressing: ABD Pad, 5x9 1 x Per Week/30 Days Discharge Instructions: Apply over primary dressing as directed. Secondary Dressing: Woven Gauze Sponge, Non-Sterile 4x4 in 1 x Per Week/30 Days Discharge Instructions: Apply over primary dressing as directed. Com pression Wrap: Urgo K2, two layer compression system, regular 1 x Per Week/30 Days Discharge Instructions: Apply Urgo K2 as directed (alternative to 4  layer compression). WOUND #7: - Lower Leg Wound Laterality: Right, Lateral Cleanser: Soap and Water 1 x Per Week/30 Days Discharge Instructions: May shower and wash wound with dial antibacterial soap and water prior to dressing change. Cleanser: Wound Cleanser 1 x Per Week/30 Days Discharge Instructions: Cleanse the wound with wound cleanser prior to applying a clean dressing using gauze sponges, not tissue or cotton balls. [name], [name] ([id number]) [id number] Page 12 of 15 Peri-Wound Care: Sween Lotion (Moisturizing lotion) 1 x Per Week/30 Days Discharge Instructions: Apply moisturizing lotion as directed Prim Dressing: Maxorb Extra Ag+ Alginate Dressing, 2x2 (in/in) 1 x Per Week/30 Days ary Discharge Instructions: Apply to wound bed as instructed Secondary Dressing: ABD Pad,  5x9 1 x Per Week/30 Days Discharge Instructions: Apply over primary dressing as directed. Secondary Dressing: Woven Gauze Sponge, Non-Sterile 4x4 in 1 x Per Week/30 Days Discharge Instructions: Apply over primary dressing as directed. Com pression Wrap: Urgo K2, two layer compression system, regular 1 x Per Week/30 Days Discharge Instructions: Apply Urgo K2 as directed (alternative to 4 layer compression). WOUND #8: - Lower Leg Wound Laterality: Left, Lateral Cleanser: Soap and Water 1 x Per Week/30 Days Discharge Instructions: May shower and wash wound with dial antibacterial soap and water prior to dressing change. Cleanser: Wound Cleanser 1 x Per Week/30 Days Discharge Instructions: Cleanse the wound with wound cleanser prior to applying a clean dressing using gauze sponges, not tissue or cotton balls. Peri-Wound Care: Sween Lotion (Moisturizing lotion) 1 x Per Week/30 Days Discharge Instructions: Apply moisturizing lotion as directed Prim Dressing: Maxorb Extra Ag+ Alginate Dressing, 2x2 (in/in) 1 x Per Week/30 Days ary Discharge Instructions: Apply to wound bed as instructed Secondary Dressing: ABD Pad, 5x9 1 x Per Week/30 Days Discharge Instructions: Apply over primary dressing as directed. Secondary Dressing: Woven Gauze Sponge, Non-Sterile 4x4 in 1 x Per Week/30 Days Discharge Instructions: Apply over primary dressing as directed. Com pression Wrap: Urgo K2, two layer compression system, regular 1 x Per Week/30 Days Discharge Instructions: Apply Urgo K2 as directed (alternative to 4 layer compression). WOUND #9: - Lower Leg Wound Laterality: Left, Posterior Cleanser: Soap and Water 1 x Per Week/30 Days Discharge Instructions: May shower and wash wound with dial antibacterial soap and water prior to dressing change. Cleanser: Wound Cleanser 1 x Per Week/30 Days Discharge Instructions: Cleanse the wound with wound cleanser prior to applying a clean dressing using gauze sponges, not  tissue or cotton balls. Peri-Wound Care: Sween Lotion (Moisturizing lotion) 1 x Per Week/30 Days Discharge Instructions: Apply moisturizing lotion as directed Prim Dressing: Maxorb Extra Ag+ Alginate Dressing, 2x2 (in/in) 1 x Per Week/30 Days ary Discharge Instructions: Apply to wound bed as instructed Secondary Dressing: ABD Pad, 5x9 1 x Per Week/30 Days Discharge Instructions: Apply over primary dressing as directed. Secondary Dressing: Woven Gauze Sponge, Non-Sterile 4x4 in 1 x Per Week/30 Days Discharge Instructions: Apply over primary dressing as directed. Com pression Wrap: Urgo K2, two layer compression system, regular 1 x Per Week/30 Days Discharge Instructions: Apply Urgo K2 as directed (alternative to 4 layer compression). 05/24/2022: He returns today with multiple open wounds. He struck his left lateral leg on a car door and has a wound related to this. He has multiple small wounds on his lateral right leg and bilateral posterior calves. He also has an ulcer on his plantar right calcaneus. There is slough and eschar on all surfaces. His edema is completely uncontrolled. I used a curette to debride eschar slough from  the majority of his leg ulcers, slough, eschar, and subcutaneous tissue from the left lateral leg ulcer, and slough and subcutaneous tissue from his heel ulcer. We will apply silver alginate and bilateral 4-layer compression. We explained that he can continue to use his lymphedema pumps even while he is legs are dressed and wrapped and even after he is healed, he needs to wear his juxta lite stockings throughout the entire day and may wear them even when he uses his lymphedema pumps. Follow-up in 1 week. Electronic Signature(s) Signed: 05/24/2022 4:24:32 PM By: Fredirick Maudlin MD FACS Entered By: Fredirick Maudlin on 05/24/2022 16:24:32 -------------------------------------------------------------------------------- HxROS Details Patient Name: Date of Service: [name],  [name] 05/24/2022 2:45 PM Medical Record Number: [id number] Patient Account Number: [id number] Date of Birth/Sex: Treating RN: 10/15/1935 (87 y.o. M) Primary Care Provider: Carol Ada Other Clinician: Referring Provider: Treating Provider/Extender: Lianne Bushy Weeks in Treatment: 0 Information Obtained From Patient Caregiver Chart Eyes Medical History: Positive for: Cataracts - bil extractions Negative for: Glaucoma; Optic Neuritis Hematologic/Lymphatic Medical History: Positive for: Lymphedema - lower legs [name], [name] ([id number]) [phone].pdf Page 13 of 15 Respiratory Medical History: Past Medical History Notes: elevated hemidiaphragm Cardiovascular Medical History: Positive for: Arrhythmia - afib; Congestive Heart Failure; Coronary Artery Disease; Hypertension Past Medical History Notes: pulmonary hypertension, mitral valve regurgitation, dilated cardiomyopathy Endocrine Medical History: Negative for: Type I Diabetes; Type II Diabetes Genitourinary Medical History: Negative for: End Stage Renal Disease Past Medical History Notes: CKD stage 3 Integumentary (Skin) Medical History: Negative for: History of Burn Musculoskeletal Medical History: Positive for: Osteoarthritis - both knees Negative for: Gout; Rheumatoid Arthritis; Osteomyelitis Neurologic Medical History: Past Medical History Notes: parkinson's Oncologic Medical History: Positive for: Received Radiation - seed implants Negative for: Received Chemotherapy Past Medical History Notes: skin CA removed, prostate CA Psychiatric Medical History: Positive for: Confinement Anxiety Negative for: Anorexia/bulimia HBO Extended History Items Eyes: Cataracts Immunizations Pneumococcal Vaccine: Received Pneumococcal Vaccination: Yes Received Pneumococcal Vaccination On or After 60th Birthday: Yes Implantable Devices No devices  added Hospitalization / Surgery History Type of Hospitalization/Surgery left femur IM nail right shoulder melanoma excision cardioversion hemorrhoid surgery cardiac anfioplasty coronary atherectomy bil cataract extraction [name], Latimer Mykal [name] ([id number] Page 14 of 15 insertion prostate radiation seed lap cholecystectomy inguinal hernia repair Family and Social History Cancer: No; Diabetes: No; Heart Disease: Yes - Father; Hereditary Spherocytosis: No; Hypertension: No; Kidney Disease: No; Lung Disease: No; Seizures: No; Stroke: Yes; Thyroid Problems: No; Tuberculosis: No; Never smoker; Marital Status - Married; Alcohol Use: Daily - 2 cocktails per day; Drug Use: No History; Caffeine Use: Daily; Financial Concerns: No; Food, Clothing or Shelter Needs: No; Support System Lacking: No; Transportation Concerns: No Engineer, maintenance) Signed: 05/24/2022 4:30:18 PM By: Fredirick Maudlin MD FACS Entered By: Fredirick Maudlin on 05/24/2022 16:20:04 -------------------------------------------------------------------------------- SuperBill Details Patient Name: Date of Service: [name] 05/24/2022 Medical Record Number: [id number] Patient Account Number: [id number] Date of Birth/Sex: Treating RN: 07-08-1935 (87 y.o. 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 I89.0 Lymphedema,  not elsewhere classified I87.2 Venous insufficiency (chronic) (peripheral) G20.C Parkinsonism, unspecified N18.30 Chronic kidney disease, stage 3 unspecified [id number] Chronic diastolic (congestive) heart failure Facility Procedures : CPT4 Code: [id number] Description: [id number] - DEB SUBQ TISSUE 20 SQ CM/< ICD-10 Diagnosis Description L97.412 Non-pressure chronic ulcer of right heel and midfoot with fat layer exposed L97.822 Non-pressure chronic ulcer of other part of left lower leg with fat layer exposed Modifier: Quantity: 1 : CPT4 Code: [id number] Description: [id number] - DEBRIDE WOUND 1ST 20 SQ CM OR < ICD-10 Diagnosis Description L97.812 Non-pressure chronic ulcer of other part of right lower leg with fat layer exposed L97.222 Non-pressure chronic ulcer of left calf with fat layer exposed L97.212  Non-pressure chronic ulcer of right calf with fat layer exposed Modifier: Quantity: 1 Physician Procedures : CPT4 Code Description Modifier [id number] - WC PHYS LEVEL 4 - EST PT 25 ICD-10 Diagnosis Description L97.212 Non-pressure chronic ulcer of right calf with fat layer exposed L97.812 Non-pressure chronic ulcer of other part of right lower leg with fat  layer exposed L97.822 Non-pressure chronic ulcer of other part of left lower leg with fat layer exposed L97.412 Non-pressure chronic ulcer of right heel and midfoot with fat layer exposed Quantity: 1 : [id number] - WC PHYS SUBQ TISS 20 SQ CM ICD-10 Diagnosis Description L97.412 Non-pressure chronic ulcer of right heel and midfoot with fat layer exposed L97.822 Non-pressure chronic ulcer of other part of left lower leg with fat layer exposed Quantity: 1 : [id number] - WC PHYS DEBR WO ANESTH 20 SQ CM [place], [place] [name] ([id number]. ICD-10 Diagnosis Description L97.812 Non-pressure chronic ulcer of other part of right lower leg with fat layer exposed L97.222  Non-pressure chronic ulcer of left calf with fat layer  exposed L97.212 Non-pressure chronic ulcer of right calf with fat layer exposed Quantity: 1 pdf Page 15 of 15 Electronic Signature(s) Signed: 05/24/2022 4:26:05 PM By: Fredirick Maudlin MD FACS Entered By: Fredirick Maudlin on 05/24/2022 16:26:05

## 2022-05-25 ENCOUNTER — Ambulatory Visit: Payer: Medicare Other | Admitting: Vascular Surgery

## 2022-05-30 ENCOUNTER — Encounter (HOSPITAL_BASED_OUTPATIENT_CLINIC_OR_DEPARTMENT_OTHER): Payer: Medicare Other | Attending: General Surgery | Admitting: General Surgery

## 2022-05-30 DIAGNOSIS — I872 Venous insufficiency (chronic) (peripheral): Secondary | ICD-10-CM | POA: Insufficient documentation

## 2022-05-30 DIAGNOSIS — N183 Chronic kidney disease, stage 3 unspecified: Secondary | ICD-10-CM | POA: Diagnosis not present

## 2022-05-30 DIAGNOSIS — L97812 Non-pressure chronic ulcer of other part of right lower leg with fat layer exposed: Secondary | ICD-10-CM | POA: Diagnosis not present

## 2022-05-30 DIAGNOSIS — L97412 Non-pressure chronic ulcer of right heel and midfoot with fat layer exposed: Secondary | ICD-10-CM | POA: Diagnosis not present

## 2022-05-30 DIAGNOSIS — L97422 Non-pressure chronic ulcer of left heel and midfoot with fat layer exposed: Secondary | ICD-10-CM | POA: Insufficient documentation

## 2022-05-30 DIAGNOSIS — F419 Anxiety disorder, unspecified: Secondary | ICD-10-CM | POA: Insufficient documentation

## 2022-05-30 DIAGNOSIS — I130 Hypertensive heart and chronic kidney disease with heart failure and stage 1 through stage 4 chronic kidney disease, or unspecified chronic kidney disease: Secondary | ICD-10-CM | POA: Insufficient documentation

## 2022-05-30 DIAGNOSIS — I4891 Unspecified atrial fibrillation: Secondary | ICD-10-CM | POA: Diagnosis not present

## 2022-05-30 DIAGNOSIS — G20A1 Parkinson's disease without dyskinesia, without mention of fluctuations: Secondary | ICD-10-CM | POA: Diagnosis not present

## 2022-05-30 DIAGNOSIS — I890 Lymphedema, not elsewhere classified: Secondary | ICD-10-CM | POA: Diagnosis not present

## 2022-05-30 DIAGNOSIS — I272 Pulmonary hypertension, unspecified: Secondary | ICD-10-CM | POA: Diagnosis not present

## 2022-05-30 DIAGNOSIS — I2510 Atherosclerotic heart disease of native coronary artery without angina pectoris: Secondary | ICD-10-CM | POA: Insufficient documentation

## 2022-05-30 DIAGNOSIS — L97822 Non-pressure chronic ulcer of other part of left lower leg with fat layer exposed: Secondary | ICD-10-CM | POA: Diagnosis not present

## 2022-05-30 DIAGNOSIS — W19XXXA Unspecified fall, initial encounter: Secondary | ICD-10-CM | POA: Diagnosis not present

## 2022-05-30 DIAGNOSIS — L97212 Non-pressure chronic ulcer of right calf with fat layer exposed: Secondary | ICD-10-CM | POA: Insufficient documentation

## 2022-05-30 DIAGNOSIS — M1990 Unspecified osteoarthritis, unspecified site: Secondary | ICD-10-CM | POA: Insufficient documentation

## 2022-05-30 DIAGNOSIS — Z7901 Long term (current) use of anticoagulants: Secondary | ICD-10-CM | POA: Insufficient documentation

## 2022-05-30 DIAGNOSIS — I420 Dilated cardiomyopathy: Secondary | ICD-10-CM | POA: Diagnosis not present

## 2022-05-30 DIAGNOSIS — I5032 Chronic diastolic (congestive) heart failure: Secondary | ICD-10-CM | POA: Insufficient documentation

## 2022-05-30 DIAGNOSIS — W2209XA Striking against other stationary object, initial encounter: Secondary | ICD-10-CM | POA: Insufficient documentation

## 2022-05-31 NOTE — Progress Notes (Signed)
KOLBEE, THIELEN ([id number]) [id number] Page 1 of 12 Visit Report for 05/30/2022 Chief Complaint Document Details Patient Name: Date of Service: [name], [name] 05/30/2022 3:15 PM Medical Record Number: [id number] Patient Account Number: [id number] Date of Birth/Sex: Treating RN: [birth year] (87 y.o. M) Primary Care Provider: Carol Ada Other Clinician: Referring Provider: Treating Provider/Extender: Lianne Bushy Weeks in Treatment: 0 Information Obtained from: Patient Chief Complaint Patient presents for treatment of open ulcers due to venous insufficiency and lymphedema Electronic Signature(s) Signed: 05/30/2022 4:56:48 PM By: Fredirick Maudlin MD FACS Entered By: Fredirick Maudlin on 05/30/2022 16:56:48 -------------------------------------------------------------------------------- Debridement Details Patient Name: Date of Service: [name] Liberty, [name] 05/30/2022 3:15 PM Medical Record Number: [id number] Patient Account Number: [id number] Date of Birth/Sex: Treating RN: 1935-11-15 (87 y.o. Hessie Diener Primary Care Provider: Carol Ada Other Clinician: Referring Provider: Treating Provider/Extender: Lianne Bushy Weeks in Treatment: 0 Debridement Performed for Assessment: Wound #5 Right Calcaneus Performed By: Physician Fredirick Maudlin, MD Debridement Type: Debridement Level of Consciousness (Pre-procedure): Awake and Alert Pre-procedure Verification/Time Out Yes - 15:50 Taken: Start Time: 15:51 Pain Control: Lidocaine 4% T opical Solution T Area Debrided (L x W): otal 0.4 (cm) x 0.3 (cm) = 0.12 (cm) Tissue and other material debrided: Non-Viable, [place] Level: Non-Viable Tissue Debridement Description: Selective/Open Wound Instrument: Curette Bleeding: Minimum End Time: 16:00 Procedural Pain: 0 Post Procedural Pain: 0 Response to Treatment: Procedure was tolerated well Level of  Consciousness (Post- Awake and Alert procedure): Post Debridement Measurements of Total Wound Length: (cm) 0.4 Width: (cm) 0.3 Depth: (cm) 0.2 Volume: (cm) 0.019 Character of Wound/Ulcer Post Debridement: Improved Post Procedure Diagnosis Same as Pre-procedure Electronic Signature(s) Signed: 05/30/2022 5:04:49 PM By: Fredirick Maudlin MD FACS Signed: 05/31/2022 4:09:50 PM By: Deon Pilling RN, BSN Entered By: Deon Pilling on 05/30/2022 16:00:37 Brendia Sacks E ([id number]) [id number] Page 2 of 12 -------------------------------------------------------------------------------- Debridement Details Patient Name: Date of Service: SKYLIN, ROOME 05/30/2022 3:15 PM Medical Record Number: [id number] Patient Account Number: [id number] Date of Birth/Sex: Treating RN: 12-Mar-1935 (87 y.o. Lorette Ang, Tammi Klippel Primary Care Provider: Carol Ada Other Clinician: Referring Provider: Treating Provider/Extender: Lianne Bushy Weeks in Treatment: 0 Debridement Performed for Assessment: Wound #6 Right,Posterior Lower Leg Performed By: Physician Fredirick Maudlin, MD Debridement Type: Debridement Level of Consciousness (Pre-procedure): Awake and Alert Pre-procedure Verification/Time Out Yes - 15:50 Taken: Start Time: 15:51 Pain Control: Lidocaine 4% T opical Solution T Area Debrided (L x W): otal 0.5 (cm) x 0.5 (cm) = 0.25 (cm) Tissue and other material debrided: Non-Viable, Eschar, Slough, Slough Level: Non-Viable Tissue Debridement Description: Selective/Open Wound Instrument: Curette Bleeding: Minimum End Time: 16:00 Procedural Pain: 0 Post Procedural Pain: 0 Response to Treatment: Procedure was tolerated well Level of Consciousness (Post- Awake and Alert procedure): Post Debridement Measurements of Total Wound Length: (cm) 0.5 Width: (cm) 0.5 Depth: (cm) 0.1 Volume: (cm) 0.02 Character of Wound/Ulcer Post Debridement:  Improved Post Procedure Diagnosis Same as Pre-procedure Electronic Signature(s) Signed: 05/30/2022 5:04:49 PM By: Fredirick Maudlin MD FACS Signed: 05/31/2022 4:09:50 PM By: Deon Pilling RN, BSN Entered By: Deon Pilling on 05/30/2022 16:00:56 -------------------------------------------------------------------------------- Debridement Details Patient Name: Date of Service: [name] Liberty, [name] 05/30/2022 3:15 PM Medical Record Number: [id number] Patient Account Number: [id number] Date of Birth/Sex: Treating RN: [birth year] (87 y.o. Hessie Diener Primary Care Provider: Carol Ada Other Clinician: Referring Provider: Treating Provider/Extender: Lianne Bushy Weeks in Treatment: 0 Debridement Performed for Assessment: Wound #8 Left,Lateral Lower Leg Performed By: Physician  Fredirick Maudlin, MD Debridement Type: Debridement Level of Consciousness (Pre-procedure): Awake and Alert Pre-procedure Verification/Time Out Yes - 15:50 Taken: Start Time: 15:51 Pain Control: Lidocaine 4% T opical Solution T Area Debrided (L x W): otal 2.4 (cm) x 2 (cm) = 4.8 (cm) Tissue and other material debrided: Non-Viable, Eschar, Slough, Slough Level: Non-Viable Tissue Debridement Description: Selective/Open Wound Instrument: Curette Bleeding: Minimum End Time: 16:00 Procedural Pain: 0 Post Procedural Pain: 0 Response to Treatment: Procedure was tolerated well Level of Consciousness (Post- Awake and Alert ELII, OSCARSON E ([id number]) [id number] Page 3 of 12 Awake and Alert procedure): Post Debridement Measurements of Total Wound Length: (cm) 2.4 Width: (cm) 2 Depth: (cm) 0.1 Volume: (cm) 0.377 Character of Wound/Ulcer Post Debridement: Improved Post Procedure Diagnosis Same as Pre-procedure Electronic Signature(s) Signed: 05/30/2022 5:04:49 PM By: Fredirick Maudlin MD FACS Signed: 05/31/2022 4:09:50 PM By: Deon Pilling RN, BSN Entered By:  Deon Pilling on 05/30/2022 16:01:24 -------------------------------------------------------------------------------- HPI Details Patient Name: Date of Service: [name] Liberty, [name] 05/30/2022 3:15 PM Medical Record Number: [id number] Patient Account Number: [id number] Date of Birth/Sex: Treating RN: 1935-09-03 (87 y.o. M) Primary Care Provider: Carol Ada Other Clinician: Referring Provider: Treating Provider/Extender: Lianne Bushy Weeks in Treatment: 0 History of Present Illness HPI Description: ADMISSION This is an 87 year old man with a past medical history significant for congestive heart failure, CKD stage III, Parkinson's disease, venous insufficiency, pulmonary hypertension, chronic A-fib/a flutter on Eliquis, coronary artery disease, and lymphedema. He has been followed in the vascular surgery clinic by Dr. Scot Dock. Per Dr. Nicole Cella last note, lymphedema pumps have been ordered, but he has not yet received them. He referred the patient to the wound care clinic for closer management of his lymphedema and venous ulcers. Venous reflux studies have been performed and unfortunately, the reflux is deep and therefore not amenable to laser ablation. ABIs in clinic today were noncompressible, but he had excellent Doppler signals. He has a small wound on his left anterior tibial surface with a bit of eschar present. He has another small wound on his right anterior tibial surface that is quite clean. He has a larger superficial ulcer on his right posterior calf that has a small amount of slough present. 04/07/2022: The only remaining open wounds are a small superficial lesion on his right posterior calf and a small ulcer on his left anterior tibial surface. They are both clean without any slough or eschar accumulation. Edema control is good. 04/15/2022: His wounds are healed. READMISSION 05/24/2022 He returns today with multiple open wounds, most of which are from lack of  understanding and compliance with the need to wear his juxta lite stockings throughout the day in addition to using his lymphedema pumps. He has been just using his lymphedema pumps without consistent compression garment use. He also struck his left lateral leg on a car door and has a wound related to this. He has multiple small wounds on his lateral right leg and bilateral posterior calves. He also has an ulcer on his plantar right calcaneus. There is slough and eschar on all surfaces. His edema is completely uncontrolled. 05/30/2022: He has 2 ulcers on the back of his right leg that are open and the large left lateral lower leg wound as well as the right heel ulcer. The other wounds have closed. There is slough accumulation on all surfaces, along with a little bit of eschar on the leg wounds. There was some misunderstanding between the patient's wife and myself; she applied his juxta  lite stockings over his 4-layer compression and had him using his lymphedema pumps twice a day. This resulted in a significant decrease in his swelling, but was probably more compression than indicated. I clarified that the juxta lite stockings were to be applied after his wounds had healed and he was out of our compression wraps. He should, however, continue to use his lymphedema pumps over our wraps now and his juxta lite stockings once he is healed. Electronic Signature(s) Signed: 05/30/2022 4:59:01 PM By: Fredirick Maudlin MD FACS Entered By: Fredirick Maudlin on 05/30/2022 16:59:01 -------------------------------------------------------------------------------- Physical Exam Details Patient Name: Date of Service: [name] Liberty, Rodarius E. 05/30/2022 3:15 PM Medical Record Number: [id number] Patient Account Number: [id number] Date of Birth/Sex: Treating RN: 01/09/1936 (87 y.o. M) Primary Care Provider: Carol Ada Other Clinician: Referring Provider: Treating Provider/Extender: Prudencio Burly, Candace Weeks in  Treatment: 0 EMRE, HUBBY E ([id number]) [id number] Page 4 of 12 Constitutional . . . . no acute distress. Respiratory Normal work of breathing on room air. Notes 05/30/2022: He has 2 ulcers on the back of his right leg that are open and the large left lateral lower leg wound as well as the right heel ulcer. The other wounds have closed. There is slough accumulation on all surfaces, along with a little bit of eschar on the leg wounds. Electronic Signature(s) Signed: 05/30/2022 4:59:37 PM By: Fredirick Maudlin MD FACS Entered By: Fredirick Maudlin on 05/30/2022 16:59:37 -------------------------------------------------------------------------------- Physician Orders Details Patient Name: Date of Service: [name] Liberty, Frederico E. 05/30/2022 3:15 PM Medical Record Number: [id number] Patient Account Number: [id number] Date of Birth/Sex: Treating RN: Jan 15, 1936 (87 y.o. Hessie Diener Primary Care Provider: Carol Ada Other Clinician: Referring Provider: Treating Provider/Extender: Lianne Bushy Weeks in Treatment: 0 Verbal / Phone Orders: No Diagnosis Coding ICD-10 Coding Code Description [phone] Non-pressure chronic ulcer of right calf with fat layer exposed L97.822 Non-pressure chronic ulcer of other part of left lower leg with fat layer exposed L97.412 Non-pressure chronic ulcer of right heel and midfoot with fat layer exposed I89.0 Lymphedema, not elsewhere classified I87.2 Venous insufficiency (chronic) (peripheral) G20.C Parkinsonism, unspecified N18.30 Chronic kidney disease, stage 3 unspecified [id number] Chronic diastolic (congestive) heart failure Follow-up Appointments ppointment in 1 week. - Dr. Celine Ahr RM 4 overflow ([place]) 0900 06/07/2022 Tuesday Return A ppointment in 2 weeks. - Dr. Celine Ahr Room 1 06/14/2022 Tuesday 245pm Return A Anesthetic (In clinic) Topical Lidocaine 4% applied to wound bed Bathing/ Shower/ Hygiene May  shower with protection but do not get wound dressing(s) wet. Protect dressing(s) with water repellant cover (for example, large plastic bag) or a cast cover and may then take shower. - may purchase cast protector at [facility], medical supply store Edema Control - Lymphedema / SCD / Other Bilateral Lower Extremities Lymphedema Pumps. Use Lymphedema pumps on leg(s) 2-3 times a day for 45-60 minutes. If wearing any wraps or hose, do not remove them. Continue exercising as instructed. Elevate legs to the level of the heart or above for 30 minutes daily and/or when sitting for 3-4 times a day throughout the day. - whenever sitting Avoid standing for long periods of time. Exercise regularly Wound Treatment Wound #5 - Calcaneus Wound Laterality: Right Cleanser: Soap and Water 1 x Per Week/30 Days Discharge Instructions: May shower and wash wound with dial antibacterial soap and water prior to dressing change. Cleanser: Wound Cleanser 1 x Per Week/30 Days Discharge Instructions: Cleanse the wound with wound cleanser prior to applying a clean dressing using gauze sponges,  not tissue or cotton balls. Prim Dressing: Maxorb Extra Ag+ Alginate Dressing, 2x2 (in/in) 1 x Per Week/30 Days ary Discharge Instructions: Apply to wound bed as instructed Secondary Dressing: ALLEVYN Heel 4 1/2in x 5 1/2in / 10.5cm x 13.5cm 1 x Per Week/30 Days Discharge Instructions: Apply over primary dressing as directed. DEMARQUISE, WEATHERLEY ([id number]) [id number] Page 5 of 12 Secondary Dressing: Optifoam Non-Adhesive Dressing, 4x4 in 1 x Per Week/30 Days Discharge Instructions: Apply over primary dressing as directed. Secondary Dressing: Woven Gauze Sponge, Non-Sterile 4x4 in 1 x Per Week/30 Days Discharge Instructions: Apply over primary dressing as directed. Compression Wrap: Urgo K2, two layer compression system, regular 1 x Per Week/30 Days Discharge Instructions: Apply Urgo K2 as directed  (alternative to 4 layer compression). Wound #6 - Lower Leg Wound Laterality: Right, Posterior Cleanser: Soap and Water 1 x Per Week/30 Days Discharge Instructions: May shower and wash wound with dial antibacterial soap and water prior to dressing change. Cleanser: Wound Cleanser 1 x Per Week/30 Days Discharge Instructions: Cleanse the wound with wound cleanser prior to applying a clean dressing using gauze sponges, not tissue or cotton balls. Peri-Wound Care: Sween Lotion (Moisturizing lotion) 1 x Per Week/30 Days Discharge Instructions: Apply moisturizing lotion as directed Prim Dressing: Maxorb Extra Ag+ Alginate Dressing, 2x2 (in/in) 1 x Per Week/30 Days ary Discharge Instructions: Apply to wound bed as instructed Secondary Dressing: ABD Pad, 5x9 1 x Per Week/30 Days Discharge Instructions: Apply over primary dressing as directed. Secondary Dressing: Woven Gauze Sponge, Non-Sterile 4x4 in 1 x Per Week/30 Days Discharge Instructions: Apply over primary dressing as directed. Compression Wrap: Urgo K2, two layer compression system, regular 1 x Per Week/30 Days Discharge Instructions: Apply Urgo K2 as directed (alternative to 4 layer compression). Wound #8 - Lower Leg Wound Laterality: Left, Lateral Cleanser: Soap and Water 1 x Per Week/30 Days Discharge Instructions: May shower and wash wound with dial antibacterial soap and water prior to dressing change. Cleanser: Wound Cleanser 1 x Per Week/30 Days Discharge Instructions: Cleanse the wound with wound cleanser prior to applying a clean dressing using gauze sponges, not tissue or cotton balls. Peri-Wound Care: Sween Lotion (Moisturizing lotion) 1 x Per Week/30 Days Discharge Instructions: Apply moisturizing lotion as directed Prim Dressing: Maxorb Extra Ag+ Alginate Dressing, 2x2 (in/in) 1 x Per Week/30 Days ary Discharge Instructions: Apply to wound bed as instructed Secondary Dressing: ABD Pad, 5x9 1 x Per Week/30 Days Discharge  Instructions: Apply over primary dressing as directed. Secondary Dressing: Woven Gauze Sponge, Non-Sterile 4x4 in 1 x Per Week/30 Days Discharge Instructions: Apply over primary dressing as directed. Compression Wrap: Urgo K2, two layer compression system, regular 1 x Per Week/30 Days Discharge Instructions: Apply Urgo K2 as directed (alternative to 4 layer compression). Electronic Signature(s) Signed: 05/30/2022 5:04:49 PM By: Fredirick Maudlin MD FACS Entered By: Fredirick Maudlin on 05/30/2022 16:59:58 -------------------------------------------------------------------------------- Problem List Details Patient Name: Date of Service: [name] Liberty, Marquise E. 05/30/2022 3:15 PM Medical Record Number: [id number] Patient Account Number: [id number] Date of Birth/Sex: Treating RN: [birth year] (87 y.o. Hessie Diener Primary Care Provider: Carol Ada Other Clinician: Referring Provider: Treating Provider/Extender: Lianne Bushy Weeks in Treatment: 0 Active Problems ICD-10 Encounter MASLAH, GUCCIARDO ([id number]) [id number] Page 6 of 12 Encounter Code Description Active Date MDM Diagnosis L97.212 Non-pressure chronic ulcer of right calf with fat layer exposed 05/24/2022 No Yes L97.822 Non-pressure chronic ulcer of other part of left lower leg with fat layer exposed3/26/2024 No Yes L97.412 Non-pressure chronic  ulcer of right heel and midfoot with fat layer exposed 05/24/2022 No Yes I89.0 Lymphedema, not elsewhere classified 05/24/2022 No Yes I87.2 Venous insufficiency (chronic) (peripheral) 05/24/2022 No Yes G20.C Parkinsonism, unspecified 05/24/2022 No Yes N18.30 Chronic kidney disease, stage 3 unspecified 05/24/2022 No Yes [id number] Chronic diastolic (congestive) heart failure 05/24/2022 No Yes Inactive Problems Resolved Problems ICD-10 Code Description Active Date Resolved Date L97.812 Non-pressure chronic ulcer of other part of right lower leg with  fat layer exposed 05/24/2022 05/24/2022 [id number] Non-pressure chronic ulcer of left calf with fat layer exposed 05/24/2022 05/24/2022 Electronic Signature(s) Signed: 05/30/2022 4:55:03 PM By: Fredirick Maudlin MD FACS Entered By: Fredirick Maudlin on 05/30/2022 16:55:03 -------------------------------------------------------------------------------- Progress Note Details Patient Name: Date of Service: [name] Liberty, Braeden E. 05/30/2022 3:15 PM Medical Record Number: [id number] Patient Account Number: [id number] Date of Birth/Sex: Treating RN: 24-Sep-1935 (87 y.o. M) Primary Care Provider: Carol Ada Other Clinician: Referring Provider: Treating Provider/Extender: Lianne Bushy Weeks in Treatment: 0 Subjective Chief Complaint Information obtained from Patient Patient presents for treatment of open ulcers due to venous insufficiency and lymphedema History of Present Illness (HPI) ADMISSION This is an 87 year old man with a past medical history significant for congestive heart failure, CKD stage III, Parkinson's disease, venous insufficiency, pulmonary hypertension, chronic A-fib/a flutter on Eliquis, coronary artery disease, and lymphedema. He has been followed in the vascular surgery clinic by Dr. Scot Dock. Per Dr. Nicole Cella last note, lymphedema pumps have been ordered, but he has not yet received them. He referred the patient to the wound care IKECHI, BABEL E ([id number]) [id number] Page 7 of 12 clinic for closer management of his lymphedema and venous ulcers. Venous reflux studies have been performed and unfortunately, the reflux is deep and therefore not amenable to laser ablation. ABIs in clinic today were noncompressible, but he had excellent Doppler signals. He has a small wound on his left anterior tibial surface with a bit of eschar present. He has another small wound on his right anterior tibial surface that is quite clean. He has a larger  superficial ulcer on his right posterior calf that has a small amount of slough present. 04/07/2022: The only remaining open wounds are a small superficial lesion on his right posterior calf and a small ulcer on his left anterior tibial surface. They are both clean without any slough or eschar accumulation. Edema control is good. 04/15/2022: His wounds are healed. READMISSION 05/24/2022 He returns today with multiple open wounds, most of which are from lack of understanding and compliance with the need to wear his juxta lite stockings throughout the day in addition to using his lymphedema pumps. He has been just using his lymphedema pumps without consistent compression garment use. He also struck his left lateral leg on a car door and has a wound related to this. He has multiple small wounds on his lateral right leg and bilateral posterior calves. He also has an ulcer on his plantar right calcaneus. There is slough and eschar on all surfaces. His edema is completely uncontrolled. 05/30/2022: He has 2 ulcers on the back of his right leg that are open and the large left lateral lower leg wound as well as the right heel ulcer. The other wounds have closed. There is slough accumulation on all surfaces, along with a little bit of eschar on the leg wounds. There was some misunderstanding between the patient's wife and myself; she applied his juxta lite stockings over his 4-layer compression and had him using his lymphedema pumps twice a day. This  resulted in a significant decrease in his swelling, but was probably more compression than indicated. I clarified that the juxta lite stockings were to be applied after his wounds had healed and he was out of our compression wraps. He should, however, continue to use his lymphedema pumps over our wraps now and his juxta lite stockings once he is healed. Patient History Information obtained from Patient, Caregiver, Chart. Family History Heart Disease - Father,  Stroke, No family history of Cancer, Diabetes, Hereditary Spherocytosis, Hypertension, Kidney Disease, Lung Disease, Seizures, Thyroid Problems, Tuberculosis. Social History Never smoker, Marital Status - Married, Alcohol Use - Daily - 2 cocktails per day, Drug Use - No History, Caffeine Use - Daily. Medical History Eyes Patient has history of Cataracts - bil extractions Denies history of Glaucoma, Optic Neuritis Hematologic/Lymphatic Patient has history of Lymphedema - lower legs Cardiovascular Patient has history of Arrhythmia - afib, Congestive Heart Failure, Coronary Artery Disease, Hypertension Endocrine Denies history of Type I Diabetes, Type II Diabetes Genitourinary Denies history of End Stage Renal Disease Integumentary (Skin) Denies history of History of Burn Musculoskeletal Patient has history of Osteoarthritis - both knees Denies history of Gout, Rheumatoid Arthritis, Osteomyelitis Oncologic Patient has history of Received Radiation - seed implants Denies history of Received Chemotherapy Psychiatric Patient has history of Confinement Anxiety Denies history of Anorexia/bulimia Hospitalization/Surgery History - left femur IM nail. - right shoulder melanoma excision. - cardioversion. - hemorrhoid surgery. - cardiac anfioplasty. - coronary atherectomy. - bil cataract extraction. - insertion prostate radiation seed. - lap cholecystectomy. - inguinal hernia repair. Medical A Surgical History Notes nd Respiratory elevated hemidiaphragm Cardiovascular pulmonary hypertension, mitral valve regurgitation, dilated cardiomyopathy Genitourinary CKD stage 3 Neurologic parkinson's Oncologic skin CA removed, prostate CA Objective Constitutional no acute distress. Vitals Time Taken: 3:54 PM, Height: 71 in, Weight: 190 lbs, BMI: 26.5, Temperature: 97.8 F, Pulse: 74 bpm, Respiratory Rate: 20 breaths/min, Blood Pressure: Rhylin, Noska Tuck E ([id number])  [id number] Page 8 of 12 138/83 mmHg. Respiratory Normal work of breathing on room air. General Notes: 05/30/2022: He has 2 ulcers on the back of his right leg that are open and the large left lateral lower leg wound as well as the right heel ulcer. The other wounds have closed. There is slough accumulation on all surfaces, along with a little bit of eschar on the leg wounds. Integumentary (Hair, Skin) Wound #5 status is Open. Original cause of wound was Shear/Friction. The date acquired was: 05/24/2022. The wound is located on the Right Calcaneus. The wound measures 0.4cm length x 0.3cm width x 0.2cm depth; 0.094cm^2 area and 0.019cm^3 volume. There is Fat Layer (Subcutaneous Tissue) exposed. There is no tunneling or undermining noted. There is a medium amount of serosanguineous drainage noted. The wound margin is distinct with the outline attached to the wound base. There is large (67-100%) red granulation within the wound bed. There is a small (1-33%) amount of necrotic tissue within the wound bed including Adherent Slough. The periwound skin appearance had no abnormalities noted for moisture. The periwound skin appearance had no abnormalities noted for color. The periwound skin appearance exhibited: Callus. Periwound temperature was noted as No Abnormality. The periwound has tenderness on palpation. Wound #6 status is Open. Original cause of wound was Gradually Appeared. The date acquired was: 05/10/2022. The wound is located on the Right,Posterior Lower Leg. The wound measures 0.5cm length x 0.5cm width x 0.1cm depth; 0.196cm^2 area and 0.02cm^3 volume. There is Fat Layer (Subcutaneous Tissue) exposed. There is no tunneling or  undermining noted. There is a medium amount of serosanguineous drainage noted. The wound margin is distinct with the outline attached to the wound base. There is large (67-100%) red, pink granulation within the wound bed. There is no necrotic  tissue within the wound bed. The periwound skin appearance had no abnormalities noted for texture. The periwound skin appearance exhibited: Hemosiderin Staining. The periwound skin appearance did not exhibit: Dry/Scaly, Maceration, Atrophie Blanche, Cyanosis, Ecchymosis, Mottled, Pallor, Rubor, Erythema. Periwound temperature was noted as No Abnormality. Wound #7 status is Open. Original cause of wound was Gradually Appeared. The date acquired was: 05/09/2022. The wound is located on the Right,Lateral Lower Leg. The wound measures 0cm length x 0cm width x 0cm depth; 0cm^2 area and 0cm^3 volume. There is no tunneling or undermining noted. There is a none present amount of drainage noted. The wound margin is distinct with the outline attached to the wound base. There is no granulation within the wound bed. There is no necrotic tissue within the wound bed. The periwound skin appearance had no abnormalities noted for texture. The periwound skin appearance had no abnormalities noted for moisture. The periwound skin appearance did not exhibit: Atrophie Blanche, Cyanosis, Ecchymosis, Hemosiderin Staining, Mottled, Pallor, Rubor, Erythema. Periwound temperature was noted as No Abnormality. Wound #8 status is Open. Original cause of wound was Trauma. The date acquired was: 05/09/2022. The wound is located on the Left,Lateral Lower Leg. The wound measures 2.4cm length x 2cm width x 0.1cm depth; 3.77cm^2 area and 0.377cm^3 volume. There is Fat Layer (Subcutaneous Tissue) exposed. There is no tunneling or undermining noted. There is a medium amount of serosanguineous drainage noted. The wound margin is distinct with the outline attached to the wound base. There is medium (34-66%) red granulation within the wound bed. There is a medium (34-66%) amount of necrotic tissue within the wound bed including Adherent Slough. The periwound skin appearance had no abnormalities noted for texture. The periwound skin appearance  had no abnormalities noted for moisture. The periwound skin appearance did not exhibit: Atrophie Blanche, Cyanosis, Ecchymosis, Hemosiderin Staining, Mottled, Pallor, Rubor, Erythema. Periwound temperature was noted as No Abnormality. Wound #9 status is Open. Original cause of wound was Gradually Appeared. The date acquired was: 05/09/2022. The wound is located on the Left,Posterior Lower Leg. The wound measures 0cm length x 0cm width x 0cm depth; 0cm^2 area and 0cm^3 volume. There is Fat Layer (Subcutaneous Tissue) exposed. There is a medium amount of serosanguineous drainage noted. The wound margin is distinct with the outline attached to the wound base. There is small (1-33%) red, pink granulation within the wound bed. There is a large (67-100%) amount of necrotic tissue within the wound bed including Eschar and Adherent Slough. The periwound skin appearance had no abnormalities noted for texture. The periwound skin appearance had no abnormalities noted for moisture. The periwound skin appearance exhibited: Rubor. Periwound temperature was noted as No Abnormality. Assessment Active Problems ICD-10 Non-pressure chronic ulcer of right calf with fat layer exposed Non-pressure chronic ulcer of other part of left lower leg with fat layer exposed Non-pressure chronic ulcer of right heel and midfoot with fat layer exposed Lymphedema, not elsewhere classified Venous insufficiency (chronic) (peripheral) Parkinsonism, unspecified Chronic kidney disease, stage 3 unspecified Chronic diastolic (congestive) heart failure Procedures Wound #5 Pre-procedure diagnosis of Wound #5 is a Lymphedema located on the Right Calcaneus . There was a Selective/Open Wound Non-Viable Tissue Debridement with a total area of 0.12 sq cm performed by Fredirick Maudlin, MD. With the  following instrument(s): Curette to remove Non-Viable tissue/material. Material removed includes Slough after achieving pain control using  Lidocaine 4% Topical Solution. A time out was conducted at 15:50, prior to the start of the procedure. A Minimum amount of bleeding was controlled with N/A. The procedure was tolerated well with a pain level of 0 throughout and a pain level of 0 following the procedure. Post Debridement Measurements: 0.4cm length x 0.3cm width x 0.2cm depth; 0.019cm^3 volume. Character of Wound/Ulcer Post Debridement is improved. Post procedure Diagnosis Wound #5: Same as Pre-Procedure Pre-procedure diagnosis of Wound #5 is a Lymphedema located on the Right Calcaneus . There was a Double Layer Compression Therapy Procedure by Deon Pilling, RN. Post procedure Diagnosis Wound #5: Same as Pre-Procedure Wound #6 Pre-procedure diagnosis of Wound #6 is a Lymphedema located on the Right,Posterior Lower Leg . There was a Selective/Open Wound Non-Viable Tissue JASANI, HELMLY E ([id number]) [id number] Page 9 of 12 Debridement with a total area of 0.25 sq cm performed by Fredirick Maudlin, MD. With the following instrument(s): Curette to remove Non-Viable tissue/material. Material removed includes Eschar and Slough and after achieving pain control using Lidocaine 4% Topical Solution. A time out was conducted at 15:50, prior to the start of the procedure. A Minimum amount of bleeding was controlled with N/A. The procedure was tolerated well with a pain level of 0 throughout and a pain level of 0 following the procedure. Post Debridement Measurements: 0.5cm length x 0.5cm width x 0.1cm depth; 0.02cm^3 volume. Character of Wound/Ulcer Post Debridement is improved. Post procedure Diagnosis Wound #6: Same as Pre-Procedure Pre-procedure diagnosis of Wound #6 is a Lymphedema located on the Right,Posterior Lower Leg . There was a Double Layer Compression Therapy Procedure by Deon Pilling, RN. Post procedure Diagnosis Wound #6: Same as Pre-Procedure Wound #8 Pre-procedure diagnosis of Wound #8 is a  Lymphedema located on the Left,Lateral Lower Leg . There was a Selective/Open Wound Non-Viable Tissue Debridement with a total area of 4.8 sq cm performed by Fredirick Maudlin, MD. With the following instrument(s): Curette to remove Non-Viable tissue/material. Material removed includes Eschar and Slough and after achieving pain control using Lidocaine 4% Topical Solution. A time out was conducted at 15:50, prior to the start of the procedure. A Minimum amount of bleeding was controlled with N/A. The procedure was tolerated well with a pain level of 0 throughout and a pain level of 0 following the procedure. Post Debridement Measurements: 2.4cm length x 2cm width x 0.1cm depth; 0.377cm^3 volume. Character of Wound/Ulcer Post Debridement is improved. Post procedure Diagnosis Wound #8: Same as Pre-Procedure Pre-procedure diagnosis of Wound #8 is a Lymphedema located on the Left,Lateral Lower Leg . There was a Double Layer Compression Therapy Procedure by Deon Pilling, RN. Post procedure Diagnosis Wound #8: Same as Pre-Procedure Plan Follow-up Appointments: Return Appointment in 1 week. - Dr. Celine Ahr RM 4 overflow ([place]) 0900 06/07/2022 Tuesday Return Appointment in 2 weeks. - Dr. Celine Ahr Room 1 06/14/2022 Tuesday 245pm Anesthetic: (In clinic) Topical Lidocaine 4% applied to wound bed Bathing/ Shower/ Hygiene: May shower with protection but do not get wound dressing(s) wet. Protect dressing(s) with water repellant cover (for example, large plastic bag) or a cast cover and may then take shower. - may purchase cast protector at [facility], medical supply store Edema Control - Lymphedema / SCD / Other: Lymphedema Pumps. Use Lymphedema pumps on leg(s) 2-3 times a day for 45-60 minutes. If wearing any wraps or hose, do not remove them. Continue exercising as instructed. Elevate  legs to the level of the heart or above for 30 minutes daily and/or when sitting for 3-4 times a day throughout the day. -  whenever sitting Avoid standing for long periods of time. Exercise regularly WOUND #5: - Calcaneus Wound Laterality: Right Cleanser: Soap and Water 1 x Per Week/30 Days Discharge Instructions: May shower and wash wound with dial antibacterial soap and water prior to dressing change. Cleanser: Wound Cleanser 1 x Per Week/30 Days Discharge Instructions: Cleanse the wound with wound cleanser prior to applying a clean dressing using gauze sponges, not tissue or cotton balls. Prim Dressing: Maxorb Extra Ag+ Alginate Dressing, 2x2 (in/in) 1 x Per Week/30 Days ary Discharge Instructions: Apply to wound bed as instructed Secondary Dressing: ALLEVYN Heel 4 1/2in x 5 1/2in / 10.5cm x 13.5cm 1 x Per Week/30 Days Discharge Instructions: Apply over primary dressing as directed. Secondary Dressing: Optifoam Non-Adhesive Dressing, 4x4 in 1 x Per Week/30 Days Discharge Instructions: Apply over primary dressing as directed. Secondary Dressing: Woven Gauze Sponge, Non-Sterile 4x4 in 1 x Per Week/30 Days Discharge Instructions: Apply over primary dressing as directed. Com pression Wrap: Urgo K2, two layer compression system, regular 1 x Per Week/30 Days Discharge Instructions: Apply Urgo K2 as directed (alternative to 4 layer compression). WOUND #6: - Lower Leg Wound Laterality: Right, Posterior Cleanser: Soap and Water 1 x Per Week/30 Days Discharge Instructions: May shower and wash wound with dial antibacterial soap and water prior to dressing change. Cleanser: Wound Cleanser 1 x Per Week/30 Days Discharge Instructions: Cleanse the wound with wound cleanser prior to applying a clean dressing using gauze sponges, not tissue or cotton balls. Peri-Wound Care: Sween Lotion (Moisturizing lotion) 1 x Per Week/30 Days Discharge Instructions: Apply moisturizing lotion as directed Prim Dressing: Maxorb Extra Ag+ Alginate Dressing, 2x2 (in/in) 1 x Per Week/30 Days ary Discharge Instructions: Apply to wound bed as  instructed Secondary Dressing: ABD Pad, 5x9 1 x Per Week/30 Days Discharge Instructions: Apply over primary dressing as directed. Secondary Dressing: Woven Gauze Sponge, Non-Sterile 4x4 in 1 x Per Week/30 Days Discharge Instructions: Apply over primary dressing as directed. Com pression Wrap: Urgo K2, two layer compression system, regular 1 x Per Week/30 Days Discharge Instructions: Apply Urgo K2 as directed (alternative to 4 layer compression). WOUND #8: - Lower Leg Wound Laterality: Left, Lateral Cleanser: Soap and Water 1 x Per Week/30 Days Discharge Instructions: May shower and wash wound with dial antibacterial soap and water prior to dressing change. Cleanser: Wound Cleanser 1 x Per Week/30 Days Discharge Instructions: Cleanse the wound with wound cleanser prior to applying a clean dressing using gauze sponges, not tissue or cotton balls. Peri-Wound Care: Sween Lotion (Moisturizing lotion) 1 x Per Week/30 Days Discharge Instructions: Apply moisturizing lotion as directed Prim Dressing: Maxorb Extra Ag+ Alginate Dressing, 2x2 (in/in) 1 x Per Week/30 Days ary Discharge Instructions: Apply to wound bed as instructed Secondary Dressing: ABD Pad, 5x9 1 x Per Week/30 Days Discharge Instructions: Apply over primary dressing as directed. Secondary Dressing: Woven Gauze Sponge, Non-Sterile 4x4 in 1 x Per Week/30 Days Discharge Instructions: Apply over primary dressing as directed. KHIRY, VOELKEL ([id number]) [id number] Page 10 of 12 Compression Wrap: Urgo K2, two layer compression system, regular 1 x Per Week/30 Days Discharge Instructions: Apply Urgo K2 as directed (alternative to 4 layer compression). 05/30/2022: He has 2 ulcers on the back of his right leg that are open and the large left lateral lower leg wound as well as the right heel ulcer.  The other wounds have closed. There is slough accumulation on all surfaces, along with a little bit of eschar on the  leg wounds. I used a curette to debride slough from the heel ulcer and slough and eschar from the 3 leg ulcers. We will continue silver alginate with 4-layer compression. He will continue to use his lymphedema pumps at home. Follow-up in 1 week. Electronic Signature(s) Signed: 05/30/2022 5:00:34 PM By: Fredirick Maudlin MD FACS Entered By: Fredirick Maudlin on 05/30/2022 17:00:33 -------------------------------------------------------------------------------- HxROS Details Patient Name: Date of Service: [name] Liberty, Korde E. 05/30/2022 3:15 PM Medical Record Number: [id number] Patient Account Number: [id number] Date of Birth/Sex: Treating RN: 1935/06/26 (87 y.o. M) Primary Care Provider: Carol Ada Other Clinician: Referring Provider: Treating Provider/Extender: Lianne Bushy Weeks in Treatment: 0 Information Obtained From Patient Caregiver Chart Eyes Medical History: Positive for: Cataracts - bil extractions Negative for: Glaucoma; Optic Neuritis Hematologic/Lymphatic Medical History: Positive for: Lymphedema - lower legs Respiratory Medical History: Past Medical History Notes: elevated hemidiaphragm Cardiovascular Medical History: Positive for: Arrhythmia - afib; Congestive Heart Failure; Coronary Artery Disease; Hypertension Past Medical History Notes: pulmonary hypertension, mitral valve regurgitation, dilated cardiomyopathy Endocrine Medical History: Negative for: Type I Diabetes; Type II Diabetes Genitourinary Medical History: Negative for: End Stage Renal Disease Past Medical History Notes: CKD stage 3 Integumentary (Skin) Medical History: Negative for: History of Burn Musculoskeletal Medical History: Positive for: Osteoarthritis - both knees Negative for: Gout; Rheumatoid Arthritis; Osteomyelitis Ballengee, Rasheed E ([id number]) [id number] Page 11 of 12 Neurologic Medical History: Past Medical History  Notes: parkinson's Oncologic Medical History: Positive for: Received Radiation - seed implants Negative for: Received Chemotherapy Past Medical History Notes: skin CA removed, prostate CA Psychiatric Medical History: Positive for: Confinement Anxiety Negative for: Anorexia/bulimia HBO Extended History Items Eyes: Cataracts Immunizations Pneumococcal Vaccine: Received Pneumococcal Vaccination: Yes Received Pneumococcal Vaccination On or After 60th Birthday: Yes Implantable Devices No devices added Hospitalization / Surgery History Type of Hospitalization/Surgery left femur IM nail right shoulder melanoma excision cardioversion hemorrhoid surgery cardiac anfioplasty coronary atherectomy bil cataract extraction insertion prostate radiation seed lap cholecystectomy inguinal hernia repair Family and Social History Cancer: No; Diabetes: No; Heart Disease: Yes - Father; Hereditary Spherocytosis: No; Hypertension: No; Kidney Disease: No; Lung Disease: No; Seizures: No; Stroke: Yes; Thyroid Problems: No; Tuberculosis: No; Never smoker; Marital Status - Married; Alcohol Use: Daily - 2 cocktails per day; Drug Use: No History; Caffeine Use: Daily; Financial Concerns: No; Food, Clothing or Shelter Needs: No; Support System Lacking: No; Transportation Concerns: No Electronic Signature(s) Signed: 05/30/2022 5:04:49 PM By: Fredirick Maudlin MD FACS Entered By: Fredirick Maudlin on 05/30/2022 16:59:18 -------------------------------------------------------------------------------- SuperBill Details Patient Name: Date of Service: [name] Liberty, Keyonte E. 05/30/2022 Medical Record Number: [id number] Patient Account Number: [id number] Date of Birth/Sex: Treating RN: 1935-07-29 (87 y.o. Hessie Diener Primary Care Provider: Carol Ada Other Clinician: Referring Provider: Treating Provider/Extender: Lianne Bushy Weeks in Treatment: 0 Diagnosis Coding ICD-10 Codes Code  Description [phone] Non-pressure chronic ulcer of right calf with fat layer exposed L97.822 Non-pressure chronic ulcer of other part of left lower leg with fat layer exposed Tamotsu, Mesquita Kortland E ([id number]) [id number] Page 12 of 12 L97.412 Non-pressure chronic ulcer of right heel and midfoot with fat layer exposed I89.0 Lymphedema, not elsewhere classified I87.2 Venous insufficiency (chronic) (peripheral) G20.C Parkinsonism, unspecified N18.30 Chronic kidney disease, stage 3 unspecified [id number] Chronic diastolic (congestive) heart failure Facility Procedures : CPT4 Code: [id number] Description: [id number] - DEBRIDE WOUND 1ST 20 SQ CM OR < ICD-10  Diagnosis Description L97.822 Non-pressure chronic ulcer of other part of left lower leg with fat layer expose L97.212 Non-pressure chronic ulcer of right calf with fat layer exposed L97.412  Non-pressure chronic ulcer of right heel and midfoot with fat layer exposed Modifier: d Quantity: 1 Physician Procedures : CPT4 Code Description Modifier [id number] - WC PHYS LEVEL 3 - EST PT 25 ICD-10 Diagnosis Description L97.212 Non-pressure chronic ulcer of right calf with fat layer exposed L97.822 Non-pressure chronic ulcer of other part of left lower leg with fat  layer exposed L97.412 Non-pressure chronic ulcer of right heel and midfoot with fat layer exposed I89.0 Lymphedema, not elsewhere classified Quantity: 1 : [id number] - WC PHYS DEBR WO ANESTH 20 SQ CM ICD-10 Diagnosis Description L97.822 Non-pressure chronic ulcer of other part of left lower leg with fat layer exposed L97.212 Non-pressure chronic ulcer of right calf with fat layer exposed L97.412  Non-pressure chronic ulcer of right heel and midfoot with fat layer exposed Quantity: 1 Electronic Signature(s) Signed: 05/30/2022 5:01:01 PM By: Fredirick Maudlin MD FACS Entered By: Fredirick Maudlin on 05/30/2022 17:01:01

## 2022-05-31 NOTE — Progress Notes (Signed)
TABARI, GOHL ([id number]) [id number] Page 1 of 16 Visit Report for 05/30/2022 Arrival Information Details Patient Name: Date of Service: [name], [name] 05/30/2022 3:15 PM Medical Record Number: [id number] Patient Account Number: [id number] Date of Birth/Sex: Treating RN: 18-Jun-1935 (87 y.o. [name], Meta.Reding Primary Care Carlisa Eble: Carol Ada Other Clinician: Referring Caral Whan: Treating Mairany Bruno/Extender: Lianne Bushy Weeks in Treatment: 0 Visit Information History Since Last Visit Added or deleted any medications: No Patient Arrived: Wheel Chair Any new allergies or adverse reactions: No Arrival Time: 15:30 Had a fall or experienced change in No Accompanied By: wife activities of daily living that may affect Transfer Assistance: Manual risk of falls: Patient Identification Verified: Yes Signs or symptoms of abuse/neglect since last visito No Secondary Verification Process Completed: Yes Hospitalized since last visit: No Patient Requires Transmission-Based Precautions: No Implantable device outside of the clinic excluding No Patient Has Alerts: Yes cellular tissue based products placed in the center Patient Alerts: ABI: BLE Moody since last visit: Has Dressing in Place as Prescribed: Yes Has Compression in Place as Prescribed: Yes Pain Present Now: No Notes Patient had bilateral double layer Urgo compression wraps what clinic applied to both legs last week. Patient arrived today with the wraps and juxtalite HD over the wrap as well. Will notify the [facility]. Per wife that Dr. Celine Ahr told them apply over the urgos as well. Electronic Signature(s) Signed: 05/31/2022 4:09:50 PM By: Deon Pilling RN, BSN Entered By: Deon Pilling on 05/30/2022 16:36:43 -------------------------------------------------------------------------------- Compression Therapy Details Patient Name: Date of Service: [name], [name] 05/30/2022 3:15  PM Medical Record Number: [id number] Patient Account Number: [id number] Date of Birth/Sex: Treating RN: [birth year] (87 y.o. [name] Primary Care Travante Knee: Carol Ada Other Clinician: Referring Little Winton: Treating Timiya Howells/Extender: Lianne Bushy Weeks in Treatment: 0 Compression Therapy Performed for Wound Assessment: Wound #5 Right Calcaneus Performed By: Clinician Deon Pilling, RN Compression Type: Double Layer Post Procedure Diagnosis Same as Pre-procedure Electronic Signature(s) Signed: 05/31/2022 4:09:50 PM By: Deon Pilling RN, BSN Entered By: Deon Pilling on 05/30/2022 16:01:35 -------------------------------------------------------------------------------- Compression Therapy Details Patient Name: Date of Service: [name], [name] 05/30/2022 3:15 PM Medical Record Number: [id number] Patient Account Number: [id number] Date of Birth/Sex: Treating RN: 01/10/1936 (87 y.o. [name] Primary Care Darion Milewski: Carol Ada Other Clinician: Referring Naya Ilagan: Treating Cornell Bourbon/Extender: Lianne Bushy Weeks in Treatment: 0 Compression Therapy Performed for Wound Assessment: Wound #6 Right,Posterior Lower Leg Performed By: Clinician Deon Pilling, RN Compression TypeCaren Hazy PATTY, THAM E ([id number]) [id number] Page 2 of 16 Post Procedure Diagnosis Same as Pre-procedure Electronic Signature(s) Signed: 05/31/2022 4:09:50 PM By: Deon Pilling RN, BSN Entered By: Deon Pilling on 05/30/2022 16:01:35 -------------------------------------------------------------------------------- Compression Therapy Details Patient Name: Date of Service: [name], [name] 05/30/2022 3:15 PM Medical Record Number: [id number] Patient Account Number: [id number] Date of Birth/Sex: Treating RN: Apr 17, 1935 (87 y.o. [name] Primary Care Jarvis Sawa: Carol Ada Other Clinician: Referring  Fatema Rabe: Treating Harjas Biggins/Extender: Lianne Bushy Weeks in Treatment: 0 Compression Therapy Performed for Wound Assessment: Wound #8 Left,Lateral Lower Leg Performed By: Clinician Deon Pilling, RN Compression Type: Double Layer Post Procedure Diagnosis Same as Pre-procedure Electronic Signature(s) Signed: 05/31/2022 4:09:50 PM By: Deon Pilling RN, BSN Entered By: Deon Pilling on 05/30/2022 16:01:35 -------------------------------------------------------------------------------- Encounter Discharge Information Details Patient Name: Date of Service: [name], [name] 05/30/2022 3:15 PM Medical Record Number: [id number] Patient Account Number: [id number] Date of Birth/Sex: Treating RN: June 02, 1935 (87 y.o. [name], [name] Primary  Care Beyonce Sawatzky: Carol Ada Other Clinician: Referring Adelee Hannula: Treating Emera Bussie/Extender: Lianne Bushy Weeks in Treatment: 0 Encounter Discharge Information Items Post Procedure Vitals Discharge Condition: Stable Temperature (F): 97.8 Ambulatory Status: Wheelchair Pulse (bpm): 74 Discharge Destination: Home Respiratory Rate (breaths/min): 20 Transportation: Private Auto Blood Pressure (mmHg): 138/83 Accompanied By: wife Schedule Follow-up Appointment: Yes Clinical Summary of Care: Electronic Signature(s) Signed: 05/31/2022 4:09:50 PM By: Deon Pilling RN, BSN Entered By: Deon Pilling on 05/30/2022 16:06:15 -------------------------------------------------------------------------------- Lower Extremity Assessment Details Patient Name: Date of Service: [name], [name] 05/30/2022 3:15 PM Medical Record Number: [id number] Patient Account Number: [id number] Date of Birth/Sex: Treating RN: 07/31/1935 (87 y.o. [name] Primary Care Keghan Mcfarren: Carol Ada Other Clinician: Referring Esmerelda Finnigan: Treating Aliciana Ricciardi/Extender: Prudencio Burly, Candace Weeks in Treatment: 0 Edema  Assessment Assessed: [Left: Yes] [Right: Yes] L[LeftRASHUN, [name] [Right: [id number] Page 3 of 16] Edema: [Left: Yes] [Right: Yes] Calf Left: Right: Point of Measurement: From Medial Instep 36 cm 38 cm Ankle Left: Right: Point of Measurement: From Medial Instep 21 cm 21 cm Vascular Assessment Pulses: Dorsalis Pedis Palpable: [Left:Yes] [Right:Yes] Electronic Signature(s) Signed: 05/31/2022 4:09:50 PM By: Deon Pilling RN, BSN Entered By: Deon Pilling on 05/30/2022 15:35:01 -------------------------------------------------------------------------------- Multi Wound Chart Details Patient Name: Date of Service: [name], [name] 05/30/2022 3:15 PM Medical Record Number: [id number] Patient Account Number: [id number] Date of Birth/Sex: Treating RN: [birth year] (87 y.o. M) Primary Care Javarus Dorner: Carol Ada Other Clinician: Referring Donnabelle Blanchard: Treating Aalijah Lanphere/Extender: Prudencio Burly, Candace Weeks in Treatment: 0 Vital Signs Height(in): 71 Pulse(bpm): 74 Weight(lbs): 190 Blood Pressure(mmHg): 138/83 Body Mass Index(BMI): 26.5 Temperature(F): 97.8 Respiratory Rate(breaths/min): 20 [5:Photos:] Right Calcaneus Right, Posterior Lower Leg Right, Lateral Lower Leg Wound Location: Shear/Friction Gradually Appeared Gradually Appeared Wounding Event: Lymphedema Lymphedema Lymphedema Primary Etiology: Cataracts, Lymphedema, Arrhythmia, Cataracts, Lymphedema, Arrhythmia, Cataracts, Lymphedema, Arrhythmia, Comorbid History: Congestive Heart Failure, Coronary Congestive Heart Failure, Coronary Congestive Heart Failure, Coronary Artery Disease, Hypertension, Artery Disease, Hypertension, Artery Disease, Hypertension, Osteoarthritis, Received Radiation, Osteoarthritis, Received Radiation, Osteoarthritis, Received Radiation, Confinement Anxiety Confinement Anxiety Confinement Anxiety 05/24/2022 05/10/2022 05/09/2022 Date  Acquired: 0 0 0 Weeks of Treatment: Open Open Open Wound Status: No No No Wound Recurrence: No Yes Yes Clustered Wound: 0.4x0.3x0.2 0.5x0.5x0.1 0x0x0 Measurements L x W x D (cm) 0.094 0.196 0 A (cm) : rea 0.019 0.02 0 Volume (cm) : -32.40% 98.50% 100.00% % Reduction in Area: -171.40% 98.50% 100.00% % Reduction in Volume: Full Thickness Without Exposed Full Thickness Without Exposed Full Thickness Without Exposed Classification: Support Structures Support Structures Support Structures Medium Medium None Present Exudate Amount: Serosanguineous Serosanguineous N/A Exudate Type: red, brown red, brown N/A Exudate Color: Distinct, outline attached Distinct, outline attached Distinct, outline attached Wound Margin: Large (67-100%) Large (67-100%) None Present (0%) Granulation Amount: Jaurice, Schieffer Aldrick E ([id number]) [id number] Page 4 of 16 Red Red, Pink N/A Granulation Quality: Small (1-33%) None Present (0%) None Present (0%) Necrotic Amount: Adherent Slough N/A N/A Necrotic Tissue: Fat Layer (Subcutaneous Tissue): Yes Fat Layer (Subcutaneous Tissue): Yes Fascia: No Exposed Structures: Fascia: No Fascia: No Fat Layer (Subcutaneous Tissue): No Tendon: No Tendon: No Tendon: No Muscle: No Muscle: No Muscle: No Joint: No Joint: No Joint: No Bone: No Bone: No Bone: No Small (1-33%) Large (67-100%) Large (67-100%) Epithelialization: Debridement - Selective/Open Wound Debridement - Selective/Open Wound N/A Debridement: Pre-procedure Verification/Time Out 15:50 15:50 N/A Taken: Lidocaine 4% Topical Solution Lidocaine 4% Topical Solution N/A Pain Control: Psychologist, prison and probation services, Slough N/A Tissue Debrided: Non-Viable Tissue Non-Viable Tissue  N/A Level: 0.12 0.25 N/A Debridement A (sq cm): rea Curette Curette N/A Instrument: Minimum Minimum N/A Bleeding: 0 0 N/A Procedural Pain: 0 0 N/A Post Procedural Pain: Procedure was  tolerated well Procedure was tolerated well N/A Debridement Treatment Response: 0.4x0.3x0.2 0.5x0.5x0.1 N/A Post Debridement Measurements L x W x D (cm) 0.019 0.02 N/A Post Debridement Volume: (cm) Callus: Yes Excoriation: No Excoriation: No Periwound Skin Texture: Induration: No Induration: No Callus: No Callus: No Crepitus: No Crepitus: No Rash: No Rash: No Scarring: No Scarring: No No Abnormalities Noted Maceration: No Maceration: No Periwound Skin Moisture: Dry/Scaly: No Dry/Scaly: No No Abnormalities Noted Hemosiderin Staining: Yes Atrophie Blanche: No Periwound Skin Color: Atrophie Blanche: No Cyanosis: No Cyanosis: No Ecchymosis: No Ecchymosis: No Erythema: No Erythema: No Hemosiderin Staining: No Mottled: No Mottled: No Pallor: No Pallor: No Rubor: No Rubor: No No Abnormality No Abnormality No Abnormality Temperature: Yes N/A N/A Tenderness on Palpation: Compression Therapy Compression Therapy N/A Procedures Performed: Debridement Debridement Wound Number: [id number] N/A Photos: N/A Left, Lateral Lower Leg Left, Posterior Lower Leg N/A Wound Location: Trauma Gradually Appeared N/A Wounding Event: Lymphedema Lymphedema N/A Primary Etiology: Cataracts, Lymphedema, Arrhythmia, Cataracts, Lymphedema, Arrhythmia, N/A Comorbid History: Congestive Heart Failure, Coronary Congestive Heart Failure, Coronary Artery Disease, Hypertension, Artery Disease, Hypertension, Osteoarthritis, Received Radiation, Osteoarthritis, Received Radiation, Confinement Anxiety Confinement Anxiety 05/09/2022 05/09/2022 N/A Date Acquired: 0 0 N/A Weeks of Treatment: Open Open N/A Wound Status: No No N/A Wound Recurrence: No No N/A Clustered Wound: 2.4x2x0.1 0x0x0 N/A Measurements L x W x D (cm) 3.77 0 N/A A (cm) : rea 0.377 0 N/A Volume (cm) : 38.50% 100.00% N/A % Reduction in Area: 38.50% 100.00% N/A % Reduction in Volume: Full Thickness Without Exposed Full  Thickness Without Exposed N/A Classification: Support Structures Support Structures Medium Medium N/A Exudate A mount: Serosanguineous Serosanguineous N/A Exudate Type: red, brown red, brown N/A Exudate Color: Distinct, outline attached Distinct, outline attached N/A Wound Margin: Medium (34-66%) Small (1-33%) N/A Granulation Amount: Red Red, Pink N/A Granulation Quality: Medium (34-66%) Large (67-100%) N/A Necrotic Amount: [place] N/A Necrotic Tissue: Fat Layer (Subcutaneous Tissue): Yes Fat Layer (Subcutaneous Tissue): Yes N/A Exposed Structures: Fascia: No Fascia: No Marlo, Bossert Dyrell E ([id number]) [phone].pdf Page 5 of 16 Tendon: No Tendon: No Muscle: No Muscle: No Joint: No Joint: No Bone: No Bone: No Medium (34-66%) Small (1-33%) N/A Epithelialization: Debridement - Selective/Open Wound N/A N/A Debridement: Pre-procedure Verification/Time Out 15:50 N/A N/A Taken: Lidocaine 4% Topical Solution N/A N/A Pain Control: Necrotic/Eschar, Slough N/A N/A Tissue Debrided: Non-Viable Tissue N/A N/A Level: 4.8 N/A N/A Debridement A (sq cm): rea Curette N/A N/A Instrument: Minimum N/A N/A Bleeding: 0 N/A N/A Procedural Pain: 0 N/A N/A Post Procedural Pain: Procedure was tolerated well N/A N/A Debridement Treatment Response: 2.4x2x0.1 N/A N/A Post Debridement Measurements L x W x D (cm) 0.377 N/A N/A Post Debridement Volume: (cm) Excoriation: No No Abnormalities Noted N/A Periwound Skin Texture: Induration: No Callus: No Crepitus: No Rash: No Scarring: No Maceration: No No Abnormalities Noted N/A Periwound Skin Moisture: Dry/Scaly: No Atrophie Blanche: No Rubor: Yes N/A Periwound Skin Color: Cyanosis: No Ecchymosis: No Erythema: No Hemosiderin Staining: No Mottled: No Pallor: No Rubor: No No Abnormality No Abnormality N/A Temperature: Compression Therapy N/A N/A Procedures  Performed: Debridement Treatment Notes Wound #5 (Calcaneus) Wound Laterality: Right Cleanser Soap and Water Discharge Instruction: May shower and wash wound with dial antibacterial soap and water prior to dressing change. Wound Cleanser Discharge Instruction:  Cleanse the wound with wound cleanser prior to applying a clean dressing using gauze sponges, not tissue or cotton balls. Peri-Wound Care Topical Primary Dressing Maxorb Extra Ag+ Alginate Dressing, 2x2 (in/in) Discharge Instruction: Apply to wound bed as instructed Secondary Dressing ALLEVYN Heel 4 1/2in x 5 1/2in / 10.5cm x 13.5cm Discharge Instruction: Apply over primary dressing as directed. Optifoam Non-Adhesive Dressing, 4x4 in Discharge Instruction: Apply over primary dressing as directed. Woven Gauze Sponge, Non-Sterile 4x4 in Discharge Instruction: Apply over primary dressing as directed. Secured With Compression Wrap Urgo K2, two layer compression system, regular Discharge Instruction: Apply Urgo K2 as directed (alternative to 4 layer compression). Compression Stockings Add-Ons Wound #6 (Lower Leg) Wound Laterality: Right, Posterior Cleanser Soap and Water ADRITH, ACKERMAN E ([id number]) [id number].pdf Page 6 of 16 Discharge Instruction: May shower and wash wound with dial antibacterial soap and water prior to dressing change. Wound Cleanser Discharge Instruction: Cleanse the wound with wound cleanser prior to applying a clean dressing using gauze sponges, not tissue or cotton balls. Peri-Wound Care Sween Lotion (Moisturizing lotion) Discharge Instruction: Apply moisturizing lotion as directed Topical Primary Dressing Maxorb Extra Ag+ Alginate Dressing, 2x2 (in/in) Discharge Instruction: Apply to wound bed as instructed Secondary Dressing ABD Pad, 5x9 Discharge Instruction: Apply over primary dressing as directed. Woven Gauze Sponge, Non-Sterile 4x4 in Discharge Instruction: Apply  over primary dressing as directed. Secured With Compression Wrap Urgo K2, two layer compression system, regular Discharge Instruction: Apply Urgo K2 as directed (alternative to 4 layer compression). Compression Stockings Add-Ons Wound #7 (Lower Leg) Wound Laterality: Right, Lateral Cleanser Peri-Wound Care Topical Primary Dressing Secondary Dressing Secured With Compression Wrap Compression Stockings Add-Ons Wound #8 (Lower Leg) Wound Laterality: Left, Lateral Cleanser Soap and Water Discharge Instruction: May shower and wash wound with dial antibacterial soap and water prior to dressing change. Wound Cleanser Discharge Instruction: Cleanse the wound with wound cleanser prior to applying a clean dressing using gauze sponges, not tissue or cotton balls. Peri-Wound Care Sween Lotion (Moisturizing lotion) Discharge Instruction: Apply moisturizing lotion as directed Topical Primary Dressing Maxorb Extra Ag+ Alginate Dressing, 2x2 (in/in) Discharge Instruction: Apply to wound bed as instructed Secondary Dressing ABD Pad, 5x9 Discharge Instruction: Apply over primary dressing as directed. Woven Gauze Sponge, Non-Sterile 4x4 in Discharge Instruction: Apply over primary dressing as directed. Secured With Compression Yasha Vanamburg, Nathan E ([id number]) [id number] Page 7 of 16 Urgo K2, two layer compression system, regular Discharge Instruction: Apply Urgo K2 as directed (alternative to 4 layer compression). Compression Stockings Add-Ons Wound #9 (Lower Leg) Wound Laterality: Left, Posterior Cleanser Peri-Wound Care Topical Primary Dressing Secondary Dressing Secured With Compression Wrap Compression Stockings Add-Ons Electronic Signature(s) Signed: 05/30/2022 4:56:39 PM By: Fredirick Maudlin MD FACS Entered By: Fredirick Maudlin on 05/30/2022  16:56:39 -------------------------------------------------------------------------------- Multi-Disciplinary Care Plan Details Patient Name: Date of Service: [name], [name] 05/30/2022 3:15 PM Medical Record Number: [id number] Patient Account Number: [id number] Date of Birth/Sex: Treating RN: 09/24/1935 (87 y.o. [name] Primary Care Keta [place]: Carol Ada Other Clinician: Referring Demondre Aguas: Treating Citlali Gautney/Extender: Lianne Bushy Weeks in Treatment: 0 Active Inactive Abuse / Safety / Falls / Self Care Management Nursing Diagnoses: Impaired physical mobility Potential for falls Goals: Patient/caregiver will verbalize/demonstrate measures taken to improve the patient's personal safety Date Initiated: 05/24/2022 Target Resolution Date: 07/08/2022 Goal Status: Active Patient/caregiver will verbalize/demonstrate measures taken to prevent injury and/or falls Date Initiated: 05/24/2022 Target Resolution Date: 07/08/2022 Goal Status: Active Interventions: Assess fall risk on admission and as needed Notes: Wound/Skin Impairment  Nursing Diagnoses: Impaired tissue integrity Knowledge deficit related to ulceration/compromised skin integrity Goals: Patient/caregiver will verbalize understanding of skin care regimen Date Initiated: 05/24/2022 Target Resolution Date: 07/08/2022 Goal Status: Active ARRON, ZAPATA ([id number]) [phone].pdf Page 8 of 16 Interventions: Assess ulceration(s) every visit Treatment Activities: Skin care regimen initiated : 05/24/2022 Topical wound management initiated : 05/24/2022 Notes: Electronic Signature(s) Signed: 05/31/2022 4:09:50 PM By: Deon Pilling RN, BSN Entered By: Deon Pilling on 05/30/2022 15:49:17 -------------------------------------------------------------------------------- Pain Assessment Details Patient Name: Date of Service: [name], Demaree E. 05/30/2022 3:15 PM Medical Record  Number: [id number] Patient Account Number: [id number] Date of Birth/Sex: Treating RN: 04-Jan-1936 (87 y.o. [name] Primary Care Zailyn Rowser: Carol Ada Other Clinician: Referring Sheyanne Munley: Treating Mayline Dragon/Extender: Lianne Bushy Weeks in Treatment: 0 Active Problems Location of Pain Severity and Description of Pain Patient Has Paino Yes Site Locations Pain Location: Pain in Ulcers Rate the pain. Current Pain Level: 7 Pain Management and Medication Current Pain Management: Medication: No Cold Application: No Rest: No Massage: No Activity: No T.GilmoreN.S.: No Heat Application: No Leg drop or elevation: No Is the Current Pain Management Adequate: Adequate How does your wound impact your activities of daily livingo Sleep: No Bathing: No Appetite: No Relationship With Others: No Bladder Continence: No Emotions: No Bowel Continence: No Work: No Toileting: No Drive: No Dressing: No Hobbies: No Electronic Signature(s) Signed: 05/31/2022 4:09:50 PM By: Deon Pilling RN, BSN Entered By: Deon Pilling on 05/30/2022 15:34:36 Patient/Caregiver Education Details -------------------------------------------------------------------------------- [name] ([id number]) [id number] Page 9 of 16 Patient Name: Date of Service: [name], [name] 4/1/2024andnbsp3:15 PM Medical Record Number: [id number] Patient Account Number: [id number] Date of Birth/Gender: Treating RN: 04-25-1935 (87 y.o. [name] Primary Care Physician: Carol Ada Other Clinician: Referring Physician: Treating Physician/Extender: Lianne Bushy Weeks in Treatment: 0 Education Assessment Education Provided To: Patient Education Topics Provided Wound/Skin Impairment: Handouts: Caring for Your Ulcer Methods: Explain/Verbal Responses: Reinforcements needed Electronic Signature(s) Signed: 05/31/2022 4:09:50 PM By: Deon Pilling  RN, BSN Entered By: Deon Pilling on 05/30/2022 15:49:32 -------------------------------------------------------------------------------- Wound Assessment Details Patient Name: Date of Service: [name], [name] 05/30/2022 3:15 PM Medical Record Number: [id number] Patient Account Number: [id number] Date of Birth/Sex: Treating RN: [birth year] (87 y.o. [name] Primary Care Ryer Asato: Carol Ada Other Clinician: Referring Skylah Delauter: Treating Leomia Blake/Extender: Prudencio Burly, Candace Weeks in Treatment: 0 Wound Status Wound Number: 5 Primary Lymphedema Etiology: Wound Location: Right Calcaneus Wound Open Wounding Event: Shear/Friction Status: Date Acquired: 05/24/2022 Comorbid Cataracts, Lymphedema, Arrhythmia, Congestive Heart Failure, Weeks Of Treatment: 0 History: Coronary Artery Disease, Hypertension, Osteoarthritis, Received Clustered Wound: No Radiation, Confinement Anxiety Photos Wound Measurements Length: (cm) 0.4 Width: (cm) 0.3 Depth: (cm) 0.2 Area: (cm) 0.094 Volume: (cm) 0.019 % Reduction in Area: -32.4% % Reduction in Volume: -171.4% Epithelialization: Small (1-33%) Tunneling: No Undermining: No Wound Description Classification: Full Thickness Without Exposed Support Structures Wound Margin: Distinct, outline attached Exudate Amount: Medium Exudate Type: Serosanguineous Exudate Color: red, brown Muro, Kregg E ([id number]) Wound Bed Granulation Amount: Large (67-100%) Granulation Quality: Red Necrotic Amount: Small (1-33%) Necrotic Quality: Adherent Slough Foul Odor After Cleansing: No Slough/Fibrino Yes [id number] Page 10 of 16 Exposed Structure Fascia Exposed: No Fat Layer (Subcutaneous Tissue) Exposed: Yes Tendon Exposed: No Muscle Exposed: No Joint Exposed: No Bone Exposed: No Periwound Skin Texture Texture Color No Abnormalities Noted: No No Abnormalities Noted: Yes Callus: Yes Temperature  / Pain Temperature: No Abnormality Moisture No Abnormalities Noted: Yes Tenderness on Palpation: Yes Treatment  Notes Wound #5 (Calcaneus) Wound Laterality: Right Cleanser Soap and Water Discharge Instruction: May shower and wash wound with dial antibacterial soap and water prior to dressing change. Wound Cleanser Discharge Instruction: Cleanse the wound with wound cleanser prior to applying a clean dressing using gauze sponges, not tissue or cotton balls. Peri-Wound Care Topical Primary Dressing Maxorb Extra Ag+ Alginate Dressing, 2x2 (in/in) Discharge Instruction: Apply to wound bed as instructed Secondary Dressing ALLEVYN Heel 4 1/2in x 5 1/2in / 10.5cm x 13.5cm Discharge Instruction: Apply over primary dressing as directed. Optifoam Non-Adhesive Dressing, 4x4 in Discharge Instruction: Apply over primary dressing as directed. Woven Gauze Sponge, Non-Sterile 4x4 in Discharge Instruction: Apply over primary dressing as directed. Secured With Compression Wrap Urgo K2, two layer compression system, regular Discharge Instruction: Apply Urgo K2 as directed (alternative to 4 layer compression). Compression Stockings Add-Ons Electronic Signature(s) Signed: 05/31/2022 4:09:50 PM By: Deon Pilling RN, BSN Entered By: Deon Pilling on 05/30/2022 15:39:59 -------------------------------------------------------------------------------- Wound Assessment Details Patient Name: Date of Service: [name], [name] 05/30/2022 3:15 PM Medical Record Number: [id number] Patient Account Number: [id number] Date of Birth/Sex: Treating RN: [birth year] (87 y.o. [name] Primary Care Heavenlee Maiorana: Carol Ada Other Clinician: Referring Cylus Douville: Treating Kimberlyn Quiocho/Extender: Prudencio Burly, Candace Weeks in Treatment: 0 Wound Status Wound Number: 6 Primary Lymphedema Etiology: Wound Location: Right, Posterior Lower Leg Wound Open Wounding Event: Gradually Appeared Status: Date  Acquired: 05/10/2022 Comorbid Cataracts, Lymphedema, Arrhythmia, Congestive Heart Failure, Weeks Of Treatment: 0 Narvell, Tollis Sho E ([id number]) [id number] Page 11 of 16 Weeks Of Treatment: 0 History: Coronary Artery Disease, Hypertension, Osteoarthritis, Received Clustered Wound: Yes Radiation, Confinement Anxiety Photos Wound Measurements Length: (cm) 0.5 Width: (cm) 0.5 Depth: (cm) 0.1 Area: (cm) 0.196 Volume: (cm) 0.02 % Reduction in Area: 98.5% % Reduction in Volume: 98.5% Epithelialization: Large (67-100%) Tunneling: No Undermining: No Wound Description Classification: Full Thickness Without Exposed Support Structures Wound Margin: Distinct, outline attached Exudate Amount: Medium Exudate Type: Serosanguineous Exudate Color: red, brown Foul Odor After Cleansing: No Slough/Fibrino No Wound Bed Granulation Amount: Large (67-100%) Exposed Structure Granulation Quality: Red, Pink Fascia Exposed: No Necrotic Amount: None Present (0%) Fat Layer (Subcutaneous Tissue) Exposed: Yes Tendon Exposed: No Muscle Exposed: No Joint Exposed: No Bone Exposed: No Periwound Skin Texture Texture Color No Abnormalities Noted: Yes No Abnormalities Noted: No Atrophie Blanche: No Moisture Cyanosis: No No Abnormalities Noted: No Ecchymosis: No Dry / Scaly: No Erythema: No Maceration: No Hemosiderin Staining: Yes Mottled: No Pallor: No Rubor: No Temperature / Pain Temperature: No Abnormality Treatment Notes Wound #6 (Lower Leg) Wound Laterality: Right, Posterior Cleanser Soap and Water Discharge Instruction: May shower and wash wound with dial antibacterial soap and water prior to dressing change. Wound Cleanser Discharge Instruction: Cleanse the wound with wound cleanser prior to applying a clean dressing using gauze sponges, not tissue or cotton balls. Peri-Wound Care Sween Lotion (Moisturizing lotion) Discharge Instruction: Apply moisturizing  lotion as directed Topical Primary Dressing Maxorb Extra Ag+ Alginate Dressing, 2x2 (in/in) Discharge Instruction: Apply to wound bed as instructed ZAKHAR, WILINSKI E ([id number]) [phone].pdf Page 12 of 16 Secondary Dressing ABD Pad, 5x9 Discharge Instruction: Apply over primary dressing as directed. Woven Gauze Sponge, Non-Sterile 4x4 in Discharge Instruction: Apply over primary dressing as directed. Secured With Compression Wrap Urgo K2, two layer compression system, regular Discharge Instruction: Apply Urgo K2 as directed (alternative to 4 layer compression). Compression Stockings Add-Ons Electronic Signature(s) Signed: 05/31/2022 4:09:50 PM By: Deon Pilling RN, BSN Entered By: Deon Pilling on 05/30/2022 15:40:23 --------------------------------------------------------------------------------  Wound Assessment Details Patient Name: Date of Service: [name], PICKLER 05/30/2022 3:15 PM Medical Record Number: [id number] Patient Account Number: [id number] Date of Birth/Sex: Treating RN: [birth year] (87 y.o. [name] Primary Care Purvi Ruehl: Carol Ada Other Clinician: Referring Annalea Alguire: Treating Obrian Bulson/Extender: Prudencio Burly, Candace Weeks in Treatment: 0 Wound Status Wound Number: 7 Primary Lymphedema Etiology: Wound Location: Right, Lateral Lower Leg Wound Open Wounding Event: Gradually Appeared Status: Date Acquired: 05/09/2022 Comorbid Cataracts, Lymphedema, Arrhythmia, Congestive Heart Failure, Weeks Of Treatment: 0 History: Coronary Artery Disease, Hypertension, Osteoarthritis, Received Clustered Wound: Yes Radiation, Confinement Anxiety Photos Wound Measurements Length: (cm) Width: (cm) Depth: (cm) Area: (cm) Volume: (cm) 0 % Reduction in Area: 100% 0 % Reduction in Volume: 100% 0 Epithelialization: Large (67-100%) 0 Tunneling: No 0 Undermining: No Wound Description Classification: Full Thickness Without  Exposed Support Structures Wound Margin: Distinct, outline attached Exudate Amount: None Present Foul Odor After Cleansing: No Slough/Fibrino No Wound Bed Granulation Amount: None Present (0%) Exposed Structure Necrotic Amount: None Present (0%) Fascia Exposed: No Fat Layer (Subcutaneous Tissue) Exposed: No Tendon Exposed: No Muscle Exposed: No Joint Exposed: No Mcentee, Fairley E ([id number]) [id number] Page 13 of 16 Bone Exposed: No Periwound Skin Texture Texture Color No Abnormalities Noted: Yes No Abnormalities Noted: No Atrophie Blanche: No Moisture Cyanosis: No No Abnormalities Noted: Yes Ecchymosis: No Erythema: No Hemosiderin Staining: No Mottled: No Pallor: No Rubor: No Temperature / Pain Temperature: No Abnormality Electronic Signature(s) Signed: 05/31/2022 4:09:50 PM By: Deon Pilling RN, BSN Entered By: Deon Pilling on 05/30/2022 15:40:43 -------------------------------------------------------------------------------- Wound Assessment Details Patient Name: Date of Service: [name], Rontae E. 05/30/2022 3:15 PM Medical Record Number: [id number] Patient Account Number: [id number] Date of Birth/Sex: Treating RN: 08/19/1935 (87 y.o. [name] Primary Care Clytie Shetley: Carol Ada Other Clinician: Referring Edgardo Petrenko: Treating Hydie Langan/Extender: Prudencio Burly, Candace Weeks in Treatment: 0 Wound Status Wound Number: 8 Primary Lymphedema Etiology: Wound Location: Left, Lateral Lower Leg Wound Open Wounding Event: Trauma Status: Date Acquired: 05/09/2022 Comorbid Cataracts, Lymphedema, Arrhythmia, Congestive Heart Failure, Weeks Of Treatment: 0 History: Coronary Artery Disease, Hypertension, Osteoarthritis, Received Clustered Wound: No Radiation, Confinement Anxiety Photos Wound Measurements Length: (cm) 2.4 Width: (cm) 2 Depth: (cm) 0.1 Area: (cm) 3.77 Volume: (cm) 0.377 % Reduction in Area: 38.5% %  Reduction in Volume: 38.5% Epithelialization: Medium (34-66%) Tunneling: No Undermining: No Wound Description Classification: Full Thickness Without Exposed Suppor Wound Margin: Distinct, outline attached Exudate Amount: Medium Exudate Type: Serosanguineous Exudate Color: red, brown t Structures Foul Odor After Cleansing: No Slough/Fibrino Yes Wound Bed Granulation Amount: Medium (34-66%) Exposed Structure Granulation Quality: Red Fascia Exposed: No Necrotic Amount: Medium (34-66%) Fat Layer (Subcutaneous Tissue) Exposed: Yes Shunta, Brain Porter E ([id number]) [id number] Page 14 of 16 Necrotic Quality: Adherent Slough Tendon Exposed: No Muscle Exposed: No Joint Exposed: No Bone Exposed: No Periwound Skin Texture Texture Color No Abnormalities Noted: Yes No Abnormalities Noted: No Atrophie Blanche: No Moisture Cyanosis: No No Abnormalities Noted: Yes Ecchymosis: No Erythema: No Hemosiderin Staining: No Mottled: No Pallor: No Rubor: No Temperature / Pain Temperature: No Abnormality Treatment Notes Wound #8 (Lower Leg) Wound Laterality: Left, Lateral Cleanser Soap and Water Discharge Instruction: May shower and wash wound with dial antibacterial soap and water prior to dressing change. Wound Cleanser Discharge Instruction: Cleanse the wound with wound cleanser prior to applying a clean dressing using gauze sponges, not tissue or cotton balls. Peri-Wound Care Sween Lotion (Moisturizing lotion) Discharge Instruction: Apply moisturizing lotion as directed Topical Primary Dressing Maxorb Extra Ag+  Alginate Dressing, 2x2 (in/in) Discharge Instruction: Apply to wound bed as instructed Secondary Dressing ABD Pad, 5x9 Discharge Instruction: Apply over primary dressing as directed. Woven Gauze Sponge, Non-Sterile 4x4 in Discharge Instruction: Apply over primary dressing as directed. Secured With Compression Wrap Urgo K2, two layer compression  system, regular Discharge Instruction: Apply Urgo K2 as directed (alternative to 4 layer compression). Compression Stockings Add-Ons Electronic Signature(s) Signed: 05/31/2022 4:09:50 PM By: Deon Pilling RN, BSN Entered By: Deon Pilling on 05/30/2022 15:41:28 -------------------------------------------------------------------------------- Wound Assessment Details Patient Name: Date of Service: [name], Marwan E. 05/30/2022 3:15 PM Medical Record Number: [id number] Patient Account Number: [id number] Date of Birth/Sex: Treating RN: 1935-11-26 (87 y.o. [name] Primary Care Shaeley Segall: Carol Ada Other Clinician: Referring Milagro Belmares: Treating Roxanna Mcever/Extender: Prudencio Burly, Candace Weeks in Treatment: 0 Wound Status Wound Number: 9 Primary Lymphedema Etiology: Wound Location: Left, Posterior Lower Leg Javad, Kio Marcanthony E ([id number]) [phone].pdf Page 15 of 16 Wound Open Wounding Event: Gradually Appeared Status: Date Acquired: 05/09/2022 Comorbid Cataracts, Lymphedema, Arrhythmia, Congestive Heart Failure, Weeks Of Treatment: 0 History: Coronary Artery Disease, Hypertension, Osteoarthritis, Received Clustered Wound: No Radiation, Confinement Anxiety Photos Wound Measurements Length: (cm) Width: (cm) Depth: (cm) Area: (cm) Volume: (cm) 0 % Reduction in Area: 100% 0 % Reduction in Volume: 100% 0 Epithelialization: Small (1-33%) 0 0 Wound Description Classification: Full Thickness Without Exposed Support Structures Wound Margin: Distinct, outline attached Exudate Amount: Medium Exudate Type: Serosanguineous Exudate Color: red, brown Foul Odor After Cleansing: No Slough/Fibrino Yes Wound Bed Granulation Amount: Small (1-33%) Exposed Structure Granulation Quality: Red, Pink Fascia Exposed: No Necrotic Amount: Large (67-100%) Fat Layer (Subcutaneous Tissue) Exposed: Yes Necrotic Quality: Eschar, Adherent Slough Tendon  Exposed: No Muscle Exposed: No Joint Exposed: No Bone Exposed: No Periwound Skin Texture Texture Color No Abnormalities Noted: Yes No Abnormalities Noted: No Rubor: Yes Moisture No Abnormalities Noted: Yes Temperature / Pain Temperature: No Abnormality Electronic Signature(s) Signed: 05/31/2022 4:09:50 PM By: Deon Pilling RN, BSN Entered By: Deon Pilling on 05/30/2022 15:41:08 -------------------------------------------------------------------------------- Vitals Details Patient Name: Date of Service: [name], Lennyn E. 05/30/2022 3:15 PM Medical Record Number: [id number] Patient Account Number: [id number] Date of Birth/Sex: Treating RN: 1935/03/02 (87 y.o. [name] Primary Care Chief Walkup: Carol Ada Other Clinician: Referring Darnelle Derrick: Treating Druanne Bosques/Extender: Lianne Bushy Weeks in Treatment: 0 Vital Signs Time Taken: 15:54 Temperature (F): 97.8 Height (in): 71 Pulse (bpm): 74 Weight (lbs): 190 Respiratory Rate (breaths/min): 20 Body Mass Index (BMI): 26.5 Blood Pressure (mmHg): 138/83 Reference Range: 80 - 120 mg / dl Baruch, Horatio E ([id number]) [id number].pdf Page 16 of 16 Electronic Signature(s) Signed: 05/31/2022 4:09:50 PM By: Deon Pilling RN, BSN Entered By: Deon Pilling on 05/30/2022 15:34:26

## 2022-06-01 ENCOUNTER — Ambulatory Visit (INDEPENDENT_AMBULATORY_CARE_PROVIDER_SITE_OTHER): Payer: Medicare Other | Admitting: Vascular Surgery

## 2022-06-01 ENCOUNTER — Encounter: Payer: Self-pay | Admitting: Vascular Surgery

## 2022-06-01 VITALS — BP 124/60 | HR 67 | Temp 98.0°F | Resp 18 | Ht 71.0 in | Wt 189.0 lb

## 2022-06-01 DIAGNOSIS — I890 Lymphedema, not elsewhere classified: Secondary | ICD-10-CM | POA: Diagnosis not present

## 2022-06-01 DIAGNOSIS — I872 Venous insufficiency (chronic) (peripheral): Secondary | ICD-10-CM

## 2022-06-01 NOTE — Progress Notes (Signed)
REASON FOR VISIT:   Follow-up of combined chronic venous insufficiency and lymphedema  MEDICAL ISSUES:   COMBINED CHRONIC VENOUS INSUFFICIENCY AND LYMPHEDEMA: Patient continues to be followed at the wound care center and has compression dressings placed once a week.  He is now using his lymphedema pump 2 hours a day.  His activity is very limited.  He does try to elevate his legs.  He does not have any indication at this point for laser ablation.  He will simply continue with conservative measures.  I will see him back if his wounds fail to heal.   HPI:   [name] is a pleasant 87 y.o. male who I had originally seen with combined chronic venous insufficiency and lymphedema.  He had deep venous reflux and severe secondary lymphedema.  This was causing lymphorrhea and skin breakdown.  He has been elevating his legs for several hours a day.  His ability to exercise was limited by his leg swelling.  He has been wearing his compression stockings routinely.  I felt that he would be an excellent candidate for a pneumatic compression device.  I last saw him on 03/23/2022.  At that time, he had extensive wounds bilaterally.  These were more significant on the right side.  He was waiting on the pneumatic compression device.  At that time, I also referred him to the wound care center.  He comes in for a 30-month follow-up visit.  Since I saw him last, he was able to obtain the lymphedema pump last month.  He has been using this for 1 hour twice a day.  He does try to elevate his legs but it is difficult for him to lie flat because he gets neck pain.  He has been having his dressing changes done by the wound care center twice a week.  These are compression dressings.  Past Medical History:  Diagnosis Date   Arthritis    "knees; lower back" (08/09/2016)   Atrial fibrillation    remote hx/notes 08/07/2016   Coronary artery disease    Episodes of trembling    "most of the time it's my left  hand" (08/09/2016)   GI bleeding 08/2017   High cholesterol    History of kidney stones    Hypertension    Melanoma of shoulder 2000s   "left"   Mitral regurgitation and mitral stenosis    hx/notes 08/07/2016   Moderate aortic stenosis    hx/notes 08/07/2016   Parkinson's disease    Prostate cancer 2011   hx/notes 08/07/2016    Family History  Problem Relation Age of Onset   Heart attack Father    Cerebral aneurysm Sister    Healthy Daughter     SOCIAL HISTORY: Social History   Tobacco Use   Smoking status: Never   Smokeless tobacco: Never   Tobacco comments:    "smoked  2 months when I was 20; non since" (08/09/2016)  Substance Use Topics   Alcohol use: Yes    Alcohol/week: 14.0 standard drinks of alcohol    Types: 14 Shots of liquor per week    Comment: 2 drinks per day    Allergies  Allergen Reactions   Nsaids Other (See Comments)    Cannot have any of these because he is currently taking Eliquis    Current Outpatient Medications  Medication Sig Dispense Refill   acetaminophen (TYLENOL) 325 MG tablet Take 1-2 tablets (325-650 mg total) by mouth every 6 (six) hours as  needed for mild pain (pain score 1-3 or temp > 100.5). 100 tablet 0   apixaban (ELIQUIS) 2.5 MG TABS tablet Take 1 tablet (2.5 mg total) by mouth 2 (two) times daily. (0800 & 2000) (Patient taking differently: Take 5 mg by mouth 2 (two) times daily. (0800 & 2000)) 180 tablet 5   carbidopa-levodopa (SINEMET IR) 25-100 MG tablet TAKE 2 TABLETS THREE TIMES DAILY AT 7:00AM, 11:00AM AND 4:00PM 540 tablet 0   Cholecalciferol (VITAMIN D-3) 1000 units CAPS Take 1,000 Units by mouth daily. (0800)     dapagliflozin propanediol (FARXIGA) 10 MG TABS tablet Take 1 tablet by mouth daily.     diltiazem (CARDIZEM CD) 120 MG 24 hr capsule TAKE 1 CAPSULE EVERY DAY 90 capsule 3   gabapentin (NEURONTIN) 300 MG capsule Take 1 capsule (300 mg total) by mouth 3 (three) times daily. 270 capsule 1   metolazone (ZAROXOLYN) 2.5  MG tablet TAKE 1 TABLET (2.5 MG TOTAL) BY MOUTH AS DIRECTED TWICE A WEEK 26 tablet 2   Multiple Vitamins-Minerals (PRESERVISION AREDS PO) Take 1 tablet by mouth daily.     nitroGLYCERIN (NITROSTAT) 0.4 MG SL tablet TAKE 1 TABLET EVERY 5 MINUTES AS NEEDED FOR CHEST PAIN 25 tablet 0   polyethylene glycol (MIRALAX / GLYCOLAX) packet Take 17 g by mouth 2 (two) times daily. (Patient taking differently: Take 17 g by mouth daily as needed.) 14 each 0   potassium chloride (KLOR-CON) 10 MEQ tablet Take 2 tablets (20 mEq total) by mouth 2 (two) times daily. (Patient taking differently: Take 40 mEq by mouth 3 (three) times daily.) 60 tablet 3   rosuvastatin (CRESTOR) 10 MG tablet Take 10 mg by mouth daily at 8 pm. (2000)     furosemide (LASIX) 40 MG tablet Take 1 tablet (40 mg total) by mouth 2 (two) times daily. (Patient taking differently: Take 40 mg by mouth 3 (three) times daily.) 60 tablet 3   No current facility-administered medications for this visit.    REVIEW OF SYSTEMS:  [X]  denotes positive finding, [ ]  denotes negative finding Cardiac  Comments:  Chest pain or chest pressure:    Shortness of breath upon exertion:    Short of breath when lying flat:    Irregular heart rhythm:        Vascular    Pain in calf, thigh, or hip brought on by ambulation:    Pain in feet at night that wakes you up from your sleep:     Blood clot in your veins:    Leg swelling:         Pulmonary    Oxygen at home:    Productive cough:     Wheezing:         Neurologic    Sudden weakness in arms or legs:     Sudden numbness in arms or legs:     Sudden onset of difficulty speaking or slurred speech:    Temporary loss of vision in one eye:     Problems with dizziness:         Gastrointestinal    Blood in stool:     Vomited blood:         Genitourinary    Burning when urinating:     Blood in urine:        Psychiatric    Major depression:         Hematologic    Bleeding problems:    Problems with  blood clotting too easily:  Skin    Rashes or ulcers: x       Constitutional    Fever or chills:     PHYSICAL EXAM:   Vitals:   06/01/22 1318  BP: 124/60  Pulse: 67  Resp: 18  Temp: 98 F (36.7 C)  TempSrc: Temporal  SpO2: 97%  Weight: 189 lb (85.7 kg)  Height: 5\' 11"  (1.803 m)    GENERAL: The patient is a well-nourished male, in no acute distress. The vital signs are documented above. CARDIAC: There is a regular rate and rhythm.  VASCULAR: He has dressings on both legs that were just placed yesterday at the wound care center. The forefoot appears warm and well-perfused bilaterally. PULMONARY: There is good air exchange bilaterally without wheezing or rales. ABDOMEN: Soft and non-tender with normal pitched bowel sounds.  MUSCULOSKELETAL: There are no major deformities or cyanosis. NEUROLOGIC: No focal weakness or paresthesias are detected. SKIN: There are no ulcers or rashes noted. PSYCHIATRIC: The patient has a normal affect.  DATA:    No new data.  Deitra Mayo [facility] [phone]

## 2022-06-07 ENCOUNTER — Encounter (HOSPITAL_BASED_OUTPATIENT_CLINIC_OR_DEPARTMENT_OTHER): Payer: Medicare Other | Admitting: General Surgery

## 2022-06-07 DIAGNOSIS — L97822 Non-pressure chronic ulcer of other part of left lower leg with fat layer exposed: Secondary | ICD-10-CM | POA: Diagnosis not present

## 2022-06-07 DIAGNOSIS — I890 Lymphedema, not elsewhere classified: Secondary | ICD-10-CM | POA: Diagnosis not present

## 2022-06-07 DIAGNOSIS — L97212 Non-pressure chronic ulcer of right calf with fat layer exposed: Secondary | ICD-10-CM | POA: Diagnosis not present

## 2022-06-07 DIAGNOSIS — L97812 Non-pressure chronic ulcer of other part of right lower leg with fat layer exposed: Secondary | ICD-10-CM | POA: Diagnosis not present

## 2022-06-07 DIAGNOSIS — L97412 Non-pressure chronic ulcer of right heel and midfoot with fat layer exposed: Secondary | ICD-10-CM | POA: Diagnosis not present

## 2022-06-07 DIAGNOSIS — N183 Chronic kidney disease, stage 3 unspecified: Secondary | ICD-10-CM | POA: Diagnosis not present

## 2022-06-07 DIAGNOSIS — L97422 Non-pressure chronic ulcer of left heel and midfoot with fat layer exposed: Secondary | ICD-10-CM | POA: Diagnosis not present

## 2022-06-08 NOTE — Progress Notes (Unsigned)
Assessment/Plan:   1.  Parkinsons Disease  -Continue carbidopa/levodopa 25/100, 2 tablets 3 times per day.  Can take extra 1/2 to 1 prn.  -Follows with Dr. Yetta BarreJones for dermatology.  We discussed that it used to be thought that levodopa would increase risk of melanoma but now it is believed that Parkinsons itself likely increases risk of melanoma.     -sleeping a lot in day but doesn't move from chair.  Has become a bit deconditioned.  Part of that is because of the lymphedema in the legs  -discussed PT.  Discussed chair yoga class for Parkinsons Disease.  I would like him to do both.  He declined for today, but told me he would think about it  2.  Renal insufficiency  -Follows with nephrology  3.  Atrial fibrillation  -On Eliquis  -Follows with cardiology  4.  Feet pain paresthesias   -Continue gabapentin, 300 mg 3 times daily.  5.  Head dyskinesia  -not bothersome to the patient (wife notices more than he does)  -hold on amantadine as not bothersome to the patient and risks of med>benefits  6.  Jaw closing dystonia  -occasionally happens but rare.  No botox required  7.  Lymphedema/venous insufficiency  -Following with wound care and vascular surgery  -using lymphedema pump   Subjective:   [name] was seen today in follow up for Parkinsons disease.  My previous records were reviewed prior to todays visit as well as outside records available to me. Pt with spouse who supplements hx.   His last fall was about 1.5 weeks ago getting out of the car.  He has been following with vascular surgery for venous insufficiency and lymphedema.  He has been following at the wound center.  He sleeps a lot but doesn't move a lot.    Current prescribed movement disorder medications: Carbidopa/levodopa 25/100, 2 tablets at 7 AM/11 AM/4 PM Gabapentin, 300 mg 3 times per day   PREVIOUS MEDICATIONS: Gabapentin for peripheral neuropathy  ALLERGIES:   Allergies  Allergen  Reactions   Nsaids Other (See Comments)    Cannot have any of these because he is currently taking Eliquis    CURRENT MEDICATIONS:  Outpatient Encounter Medications as of 06/09/2022  Medication Sig   acetaminophen (TYLENOL) 325 MG tablet Take 1-2 tablets (325-650 mg total) by mouth every 6 (six) hours as needed for mild pain (pain score 1-3 or temp > 100.5).   apixaban (ELIQUIS) 2.5 MG TABS tablet Take 1 tablet (2.5 mg total) by mouth 2 (two) times daily. (0800 & 2000) (Patient taking differently: Take 5 mg by mouth 2 (two) times daily. (0800 & 2000))   carbidopa-levodopa (SINEMET IR) 25-100 MG tablet TAKE 2 TABLETS THREE TIMES DAILY AT 7:00AM, 11:00AM AND 4:00PM   Cholecalciferol (VITAMIN D-3) 1000 units CAPS Take 1,000 Units by mouth daily. (0800)   dapagliflozin propanediol (FARXIGA) 10 MG TABS tablet Take 1 tablet by mouth daily.   diltiazem (CARDIZEM CD) 120 MG 24 hr capsule TAKE 1 CAPSULE EVERY DAY   furosemide (LASIX) 40 MG tablet Take 1 tablet (40 mg total) by mouth 2 (two) times daily. (Patient taking differently: Take 40 mg by mouth 3 (three) times daily.)   gabapentin (NEURONTIN) 300 MG capsule Take 1 capsule (300 mg total) by mouth 3 (three) times daily.   metolazone (ZAROXOLYN) 2.5 MG tablet TAKE 1 TABLET (2.5 MG TOTAL) BY MOUTH AS DIRECTED TWICE A WEEK   Multiple Vitamins-Minerals (PRESERVISION AREDS PO)  Take 1 tablet by mouth daily.   polyethylene glycol (MIRALAX / GLYCOLAX) packet Take 17 g by mouth 2 (two) times daily. (Patient taking differently: Take 17 g by mouth daily as needed.)   potassium chloride (KLOR-CON) 10 MEQ tablet Take 2 tablets (20 mEq total) by mouth 2 (two) times daily. (Patient taking differently: Take 40 mEq by mouth 3 (three) times daily.)   rosuvastatin (CRESTOR) 10 MG tablet Take 10 mg by mouth daily at 8 pm. (2000)   nitroGLYCERIN (NITROSTAT) 0.4 MG SL tablet TAKE 1 TABLET EVERY 5 MINUTES AS NEEDED FOR CHEST PAIN (Patient not taking: Reported on 06/09/2022)    No facility-administered encounter medications on file as of 06/09/2022.    Objective:   PHYSICAL EXAMINATION:    VITALS:   Vitals:   06/09/22 1504  BP: 136/84  Pulse: 74  Resp: 18  SpO2: 98%  Weight: 198 lb (89.8 kg)  Height: 5\' 11"  (1.803 m)     GEN:  The patient appears stated age and is in NAD. HEENT:  Normocephalic, atraumatic.  The mucous membranes are moist. The superficial temporal arteries are without ropiness or tenderness. CV: Irregular. Lungs:  CTAB Neck/HEME:  There are no carotid bruits bilaterally. Exts:  Legs are wrapped and much less swollen than previous  Neurological examination:  Orientation: The patient is alert and oriented x3. Cranial nerves: There is good facial symmetry with facial hypomimia. The speech is fluent and clear. Soft palate rises symmetrically and there is no tongue deviation. Hearing is intact to conversational tone. Sensation: Sensation is intact to light touch throughout Motor: Strength is at least antigravity x4.    Movement examination: Tone: There is mild increased tone in the bilateral upper extremities Abnormal movements: no head dyskinesia Coordination:  There is mild decremation with finger taps Gait and Station: The patient pushes off to arise.  He is given a walker.  He is flexed at the knees.  The examiner holds onto him as he ambulates.  He is slow.    I have reviewed and interpreted the following labs independently    Chemistry      Component Value Date/Time   NA 140 03/01/2022 1443   K 3.1 (L) 03/01/2022 1443   CL 87 (L) 03/01/2022 1443   CO2 33 (H) 03/01/2022 1443   BUN 35 (H) 03/01/2022 1443   CREATININE 1.64 (H) 03/01/2022 1443      Component Value Date/Time   CALCIUM 9.6 03/01/2022 1443   ALKPHOS 103 05/24/2020 0859   AST 32 05/24/2020 0859   ALT 10 05/24/2020 0859   BILITOT 5.1 (H) 05/24/2020 0859       Lab Results  Component Value Date   WBC 6.3 05/24/2020   HGB 11.6 (L) 05/24/2020   HCT  36.9 (L) 05/24/2020   MCV 94.4 05/24/2020   PLT 172 05/24/2020    Lab Results  Component Value Date   TSH 2.94 09/15/2016     Total time spent on today's visit was 32 minutes, including both face-to-face time and nonface-to-face time.  Time included that spent on review of records (prior notes available to me/labs/imaging if pertinent), discussing treatment and goals, answering patient's questions and coordinating care.  Cc:  Merri Brunette, MD

## 2022-06-09 ENCOUNTER — Ambulatory Visit (INDEPENDENT_AMBULATORY_CARE_PROVIDER_SITE_OTHER): Payer: Medicare Other | Admitting: Neurology

## 2022-06-09 ENCOUNTER — Encounter: Payer: Self-pay | Admitting: Neurology

## 2022-06-09 VITALS — BP 136/84 | HR 74 | Resp 18 | Ht 71.0 in | Wt 198.0 lb

## 2022-06-09 DIAGNOSIS — G20B2 Parkinson's disease with dyskinesia, with fluctuations: Secondary | ICD-10-CM

## 2022-06-09 NOTE — Patient Instructions (Signed)
Local and Online Resources for Power over Parkinson's Group  April 2024   LOCAL [place] PARKINSON'[facility] GROUPS   Power over Parkinson's Group:    Power Over Parkinson's Patient Education Group will be Wednesday, April 10th-*Hybrid meting*- in person at [place] [facility] location and via WEBEX, 2:00-3:00 pm.   Power over Parkinson's and Care Partner Groups will meet together, with plans for separate break out session for caregivers, depending on topic/speaker Upcoming Power over Parkinson's Meetings/Care Partner Support:  2nd Wednesdays of the month at 2 pm:   April 10th, May 8th Contact Amy Marriott at [email or web address] if interested in participating in this group    LOCAL EVENTS AND NEW OFFERINGS  NEW:  Parkinson's Social Game Night.  First Thursday of each month, 2:00-4:00 pm.  *Next date is April 4th*.  [facility], [place].  Contact [email or web address] if interested. Parkinson's CarePartner Group for Men is in the works, if interested email [email or web address] ACT FITNESS Chair Yoga classes "Train and Gain", Fridays 10 am, [employer].  Contact Gina at [phone].  Community Fitness Instructor-Led Parkinson's Exercises Classes offering at [employer]!  TUESDAYS (Chair Yoga)  and Wednesdays (PWR! Moves)  1:00 pm.   Contact Christy Weaver at  [email or web address]  or [phone]  Drumming for Parkinson's will be held on 2nd and 4th Mondays at 11:00 am.   Located at the [employer] ([address] [place].)  Contact Jane Maydian at [email or web address] or [phone]  Dance for Parkinson 's classes will be on Tuesdays 10-11 am. Located in the [facility], in the first floor of the [facility] ([address] To register:  [email or web address] or [phone] [employer] Parkinson's Tai Chi Class, Mondays at 11 am.  Call [phone] for  details [place] Challenge.  Bike, Run, Walk Fundraiser for Parkinson's.  Saturday, April 6th at [place].  To register, visit [email or web address] Moving Day [place].  Saturday, May 4th, 10 am start.  Register at [email or web address]    ONLINE EDUCATION AND SUPPORT  Parkinson Foundation:  www.parkinson.org  PD Health at Home continues:  Mindfulness Mondays, Wellness Wednesdays, Fitness Fridays  (PWR! Moves as part of Fitness Fridays March 22nd, 1-1:45 pm) Upcoming Education:   Parkinson's 101.  Wednesday, April 3rd, 1-2 pm Movement for Parkinson's.  Wednesday, May 1st, 1-2 pm Expert Briefing:  Research Update:  Working to Halt PD.  Wednesday, April 10th, 1-2 pm Trouble with Zzz's:  Sleep Challenges with Parkinson's.  Wed, May 8th 1-2 pm Register for virtual education and expert briefings (webinars) at [email or web address]-support/[email or web address]-education Please check out their website to sign up for emails and see their full online offerings     [employer] Foundation:  www.[email or web address]   Third Thursday Webinars:  On the third Thursday of every month at 12 p.m. ET, join our free live webinars to learn about various aspects of living with Parkinson's disease and our work to speed medical breakthroughs.  Upcoming Webinar:  Let's Talk Taboos:  Hard-to-Discuss Parkinson's Symptoms.  Thursday, April 18th at 12 noon. Check out additional information on their website to see their full online offerings    [employer] Phinney Foundation:  www.[email or web address].[email or web address]  Upcoming Webinar:   [facility]gent [facility].  Wednesday, April 2nd, 4 pm Series:  Living with Parkinson's Meetup.   Third Thursdays each month, 3 pm  Care Partner Monthly Meetup.  With Connie Carpenter Phinney.  First Tuesday of each month, 2 pm  Check out additional   information to Live Well Today on their website    Parkinson and Movement Disorders (PMD) Alliance:  www.pmdalliance.org  [employer]  Online:  Online Education Events  Sign up for emails, which are sent weekly to give you updates on programming and online offerings    Parkinson's Association of [place]:  www.parkinsonassociation.org  Information on online support groups, education events, and online exercises including Yoga, Parkinson's exercises and more-LOTS of information on links to PD resources and online events  Virtual Support Group through Parkinson's Association of [place]; next one is scheduled for Wednesday, April 3rd  MOVEMENT AND EXERCISE OPPORTUNITIES  PWR! Moves Classes at [place] Exercise Room.  Wednesdays 10 and 11 am.   Contact Amy Marriott, PT [email or web address] if interested.  Parkinson's Exercise Class offerings at [employer]. *TUESDAYS* (Chair yoga) and Wednesdays (PWR! Moves)  1:00 pm.    Contact Christy Weaver at [email or web address]    Parkinson's Wellness Recovery (PWR! Moves)  www.[email or web address]  Info on the PWR! Virtual Experience:  You will have access to our expertise?through self-assessment, guided plans that start with the PD-specific fundamentals, educational content, tips, Q&A with an expert, and a growing library of PD-specific pre-recorded and live exercise classes of varying types and intensity - both physical and cognitive! If that is not enough, we offer 1:1 wellness consultations (in-person or virtual) to personalize your PWR! Virtual Experience.   Parkinson Foundation Fitness Fridays:   As part of the PD Health @ Home program, this free video series focuses each week on one aspect of fitness designed to support people living with Parkinson's.? These weekly videos highlight the Parkinson Foundation fitness guidelines for people with Parkinson's disease.  www.parkinson.org/resources-support/online-education/pdhealth#ff  Dance for PD website is offering free, live-stream classes throughout the week, as well as links to digital library of classes:   [email or web address]  Virtual dance and Pilates for Parkinson's classes: Click on the Community Tab> Parkinson's Movement Initiative Tab.  To register for classes and for more information, visit www.[email or web address] and click the "community" tab.   [employer] Parkinson's Cycling Classes   [employer]:  Thursdays @ Noon-Live classes at [employer] (Contact Margaret Hazen at [email or web address]?or [phone])  [facility]: Classes Tuesday, Wednesday and Thursday (contact Marlee at [email or web address] ?or [phone])  [place] Steady Boxing  Varied levels of classes are offered Tuesdays and Thursdays at [employer] [facility] [facility].   Stretching with Maria weekly class is also offered for people with Parkinson's  To observe a class or for more information, call [phone] or email Hillary Savage at [email or web address]   ADDITIONAL SUPPORT AND RESOURCES  [employer]:  Online Caregiver Education Opportunities:  www.well-springsolutions.org/caregiver-education/caregiver-support-group.  You may also contact Jodi Kolada at [email or web address] or [phone].     [employer] April Offerings National HealthCare Decisions Day:  The Most Critical Legal and Medical Decisions to Consider Now!  Tuesday, April 16th, 1-3 pm at [facility] at [address].  Contact Jodi Kolada at [email or web address] or [phone] Powerful Tools for Caregivers.  6 week educational series for caregivers.  April 18-May 23, 10:30 am-12:15 pm at [facility] 3rd Floor Conference Room.   Contact Jodi Kolada at [email or web address] or [phone] to register [employer] Navigator:  Just1Navigator program, a?free service to help individuals and families through the journey of determining care for older adults.  The "Navigator" is a social worker, Nicole Reynolds, who will speak with a prospective client and/or loved  ones to provide an assessment of the situation and a set of recommendations for a personalized care   plan -- all free of charge, and whether?[employer] offers the needed service or not. If the need is not a service we provide, we are well-connected with reputable programs in town that we can refer you to.  www.well-[email or web address] or to speak with the Navigator, call [phone].     

## 2022-06-13 DIAGNOSIS — I50810 Right heart failure, unspecified: Secondary | ICD-10-CM | POA: Diagnosis not present

## 2022-06-13 DIAGNOSIS — I872 Venous insufficiency (chronic) (peripheral): Secondary | ICD-10-CM | POA: Diagnosis not present

## 2022-06-13 DIAGNOSIS — N183 Chronic kidney disease, stage 3 unspecified: Secondary | ICD-10-CM | POA: Diagnosis not present

## 2022-06-13 DIAGNOSIS — I129 Hypertensive chronic kidney disease with stage 1 through stage 4 chronic kidney disease, or unspecified chronic kidney disease: Secondary | ICD-10-CM | POA: Diagnosis not present

## 2022-06-13 DIAGNOSIS — E876 Hypokalemia: Secondary | ICD-10-CM | POA: Diagnosis not present

## 2022-06-14 ENCOUNTER — Encounter (HOSPITAL_BASED_OUTPATIENT_CLINIC_OR_DEPARTMENT_OTHER): Payer: Medicare Other | Admitting: General Surgery

## 2022-06-14 DIAGNOSIS — I890 Lymphedema, not elsewhere classified: Secondary | ICD-10-CM | POA: Diagnosis not present

## 2022-06-14 DIAGNOSIS — L97422 Non-pressure chronic ulcer of left heel and midfoot with fat layer exposed: Secondary | ICD-10-CM | POA: Diagnosis not present

## 2022-06-14 DIAGNOSIS — L97212 Non-pressure chronic ulcer of right calf with fat layer exposed: Secondary | ICD-10-CM | POA: Diagnosis not present

## 2022-06-14 DIAGNOSIS — L97412 Non-pressure chronic ulcer of right heel and midfoot with fat layer exposed: Secondary | ICD-10-CM | POA: Diagnosis not present

## 2022-06-14 DIAGNOSIS — L97812 Non-pressure chronic ulcer of other part of right lower leg with fat layer exposed: Secondary | ICD-10-CM | POA: Diagnosis not present

## 2022-06-14 DIAGNOSIS — L97822 Non-pressure chronic ulcer of other part of left lower leg with fat layer exposed: Secondary | ICD-10-CM | POA: Diagnosis not present

## 2022-06-15 NOTE — Progress Notes (Signed)
[name] ([id number]) [id number] Page 1 of 8 Visit Report for 06/14/2022 Arrival Information Details Patient Name: Date of Service: [name], GARRIGA 06/14/2022 2:45 PM Medical Record Number: [id number] Patient Account Number: [id number] Date of Birth/Sex: Treating RN: [birth year] (87 y.o. M) Primary Care Estil Vallee: Merri Brunette Other Clinician: Referring Mariesa Grieder: Treating Rubyann Lingle/Extender: Mortimer Fries Weeks in Treatment: 3 Visit Information History Since Last Visit All ordered tests and consults were completed: No Patient Arrived: Wheel Chair Added or deleted any medications: No Arrival Time: 15:00 Any new allergies or adverse reactions: No Accompanied By: wife Had a fall or experienced change in No Transfer Assistance: None activities of daily living that may affect Patient Identification Verified: Yes risk of falls: Secondary Verification Process Completed: Yes Signs or symptoms of abuse/neglect since last visito No Patient Requires Transmission-Based Precautions: No Hospitalized since last visit: No Patient Has Alerts: Yes Implantable device outside of the clinic excluding No Patient Alerts: ABI: BLE [place] cellular tissue based products placed in the center since last visit: Has Dressing in Place as Prescribed: Yes Has Compression in Place as Prescribed: Yes Pain Present Now: No Electronic Signature(s) Signed: 06/14/2022 4:58:18 PM By: Zenaida Deed RN, BSN Entered By: Zenaida Deed on 06/14/2022 15:11:34 -------------------------------------------------------------------------------- Encounter Discharge Information Details Patient Name: Date of Service: [name] Fare, Cote [name]. 06/14/2022 2:45 PM Medical Record Number: [id number] Patient Account Number: [id number] Date of Birth/Sex: Treating RN: Feb 18, 1936 (87 y.o. [name] Primary Care Takeesha Isley: Merri Brunette Other Clinician: Referring  Alailah Safley: Treating Chrystie Hagwood/Extender: Mortimer Fries Weeks in Treatment: 3 Encounter Discharge Information Items Post Procedure Vitals Discharge Condition: Stable Temperature (F): 97.5 Ambulatory Status: Wheelchair Pulse (bpm): 75 Discharge Destination: Home Respiratory Rate (breaths/min): 18 Transportation: Private Auto Blood Pressure (mmHg): 118/47 Accompanied By: spouse Schedule Follow-up Appointment: Yes Clinical Summary of Care: Patient Declined Electronic Signature(s) Signed: 06/14/2022 4:58:18 PM By: Zenaida Deed RN, BSN Entered By: Zenaida Deed on 06/14/2022 15:52:04 -------------------------------------------------------------------------------- Lower Extremity Assessment Details Patient Name: Date of Service: [name] Fare, Isaah [name]. 06/14/2022 2:45 PM Medical Record Number: [id number] Patient Account Number: [id number] Date of Birth/Sex: Treating RN: 10/28/1935 (87 y.o. [name] Primary Care Nohemi Nicklaus: Merri Brunette Other Clinician: Referring Darolyn Double: Treating Naiomy Watters/Extender: Otho Ket, Candace Weeks in Treatment: 3 Edema Assessment Left: [Left: Right] [Right: :] Assessed: [Left: No] [Right: No] Edema: [Left: Yes] [Right: Yes] Calf Left: Right: Point of Measurement: From Medial Instep 36.5 cm 36.5 cm Ankle Left: Right: Point of Measurement: From Medial Instep 22.5 cm 22.5 cm Vascular Assessment Pulses: Dorsalis Pedis Palpable: [Left:Yes] [Right:Yes] Electronic Signature(s) Signed: 06/14/2022 4:58:18 PM By: Zenaida Deed RN, BSN Entered By: Zenaida Deed on 06/14/2022 15:14:54 -------------------------------------------------------------------------------- Multi Wound Chart Details Patient Name: Date of Service: [name] Fare, Nazim [name]. 06/14/2022 2:45 PM Medical Record Number: [id number] Patient Account Number: [id number] Date of Birth/Sex: Treating RN: [birth year] (87 y.o. M) Primary Care Yanelis Osika: Merri Brunette Other Clinician: Referring Keaun Schnabel: Treating Jenevie Casstevens/Extender: Otho Ket, Candace Weeks in Treatment: 3 Vital Signs Height(in): 71 Pulse(bpm): 75 Weight(lbs): 190 Blood Pressure(mmHg): 118/47 Body Mass Index(BMI): 26.5 Temperature(F): 97.5 Respiratory Rate(breaths/min): 18 [10:Photos: No Photos Right, Anterior Lower Leg Wound Location: Not Known Wounding Event: Lymphedema Primary Etiology: Cataracts, Lymphedema, Arrhythmia, Comorbid History: Congestive Heart Failure, Coronary Artery Disease, Hypertension, Osteoarthritis, Received  Radiation, Confinement Anxiety 06/04/2022 Date Acquired: 1 Weeks of Treatment: Open Wound Status: No Wound Recurrence: 0x0x0 Measurements L x W x D (cm) 0 A (cm) : rea 0 Volume (cm) : 100.00% % Reduction in  Area: 100.00% % Reduction in Volume: Full  Thickness Without Exposed Classification: Support Structures None Present Exudate Amount: N/A Exudate Type: N/A Exudate Color: Flat and Intact Wound Margin: None Present (0%) Granulation Amount: N/A Granulation Quality: None Present (0%) Necrotic Amount:  Fascia: No Exposed Structures: Fat Layer (Subcutaneous Tissue): No Tendon: No Muscle: No Joint: No Bone: No] [5:No Photos Right Calcaneus Shear/Friction Lymphedema Cataracts, Lymphedema, Arrhythmia, Congestive Heart Failure, Coronary Artery Disease,  Hypertension, Osteoarthritis, Received Radiation, Confinement Anxiety 05/24/2022 3 Open No 0x0x0 0 0 100.00% 100.00% Full Thickness Without Exposed Support Structures None Present N/A N/A N/A None Present (0%) N/A None Present (0%) Fascia: No Fat Layer  (Subcutaneous Tissue): No Tendon: No Muscle: No Joint: No Bone: No] [8:No Photos Left, Lateral Lower Leg Trauma Lymphedema Cataracts, Lymphedema, Arrhythmia, Congestive Heart Failure, Coronary Artery Disease, Hypertension, Osteoarthritis, Received  Radiation, Confinement Anxiety 05/09/2022 3 Open No 2.2x2.3x0.1 3.974 0.397 35.10% 35.20% Full Thickness  Without Exposed Support Structures Medium Serosanguineous red, brown Distinct, outline attached Medium (34-66%) Red None Present (0%) Fat Layer  (Subcutaneous Tissue): Yes Fascia: No Tendon: No Muscle: No Joint: No Bone: No] [name] ([id number]) [10:Large (67-100%) Epithelialization: N/A Debridement: N/A Pre-procedure Verification/Time Out Taken: N/A Pain Control: N/A Level: N/A Debridement A (sq cm): rea N/A Instrument: N/A Bleeding: N/A Hemostasis A chieved: N/A Procedural Pain: N/A Post  Procedural Pain: N/A Debridement Treatment Response: N/A Post Debridement Measurements L x W x D (cm) N/A Post Debridement Volume: (cm) No Abnormalities Noted Periwound Skin Texture: Dry/Scaly: Yes Periwound Skin Moisture: Maceration: No Hemosiderin  Staining: Yes Periwound Skin Color: N/A Temperature: N/A Tenderness on Palpation: N/A Procedures Performed:] [5:Large (67-100%) N/A N/A N/A N/A N/A N/A N/A N/A N/A N/A N/A N/A N/A Callus: Yes No Abnormalities Noted No Abnormalities Noted No Abnormality  Yes N/A] [8:[id number] Page 3 of 8 Medium (34-66%) Debridement - Selective/Open Wound 15:30 Lidocaine 4% Topical Solution Skin/Epidermis 5.06 Forceps, Scissors Minimum Pressure 0 0 Procedure was tolerated well 2.2x2.3x0.1 0.397  Excoriation: No Induration: No Callus: No Crepitus: No Rash: No Scarring: No Maceration: No Dry/Scaly: No Hemosiderin Staining: Yes Atrophie Blanche: No Cyanosis: No Ecchymosis: No Erythema: No Mottled: No Pallor: No Rubor: No No Abnormality Yes  Debridement] Treatment Notes Electronic Signature(s) Signed: 06/14/2022 3:47:51 PM By: Duanne Guess MD FACS Entered By: Duanne Guess on 06/14/2022 15:47:51 -------------------------------------------------------------------------------- Multi-Disciplinary Care Plan Details Patient Name: Date of Service: [name] Fare, Jonavin [name]. 06/14/2022 2:45 PM Medical Record Number: [id number] Patient Account Number:  [id number] Date of Birth/Sex: Treating RN: 16-Sep-1935 (87 y.o. [name] Primary Care Shantee Hayne: Merri Brunette Other Clinician: Referring Vonnetta Akey: Treating Ralphie Lovelady/Extender: Willey Blade in Treatment: 3 Multidisciplinary Care Plan reviewed with physician Active Inactive Abuse / Safety / Falls / Self Care Management Nursing Diagnoses: Impaired physical mobility Potential for falls Goals: Patient/caregiver will verbalize/demonstrate measures taken to improve the patient's personal safety Date Initiated: 05/24/2022 Target Resolution Date: 07/08/2022 Goal Status: Active Patient/caregiver will verbalize/demonstrate measures taken to prevent injury and/or falls Date Initiated: 05/24/2022 Target Resolution Date: 07/08/2022 Goal Status: Active Interventions: Assess fall risk on admission and as needed Notes: Wound/Skin Impairment BENUEL, LY [name] ([id number]) [id number] Page 4 of 8 Nursing Diagnoses: Impaired tissue integrity Knowledge deficit related to ulceration/compromised skin integrity Goals: Patient/caregiver will verbalize understanding of skin care regimen Date Initiated: 05/24/2022 Target Resolution Date: 07/08/2022 Goal Status: Active Interventions: Assess ulceration(s) every visit Treatment Activities: Skin care regimen initiated : 05/24/2022 Topical wound management initiated : 05/24/2022 Notes: Electronic Signature(s) Signed: 06/14/2022  4:58:18 PM By: Zenaida Deed RN, BSN Entered By: Zenaida Deed on 06/14/2022 15:23:38 -------------------------------------------------------------------------------- Pain Assessment Details Patient Name: Date of Service: [name] Fare, Alrick [name]. 06/14/2022 2:45 PM Medical Record Number: [id number] Patient Account Number: [id number] Date of Birth/Sex: Treating RN: 04/21/1935 (87 y.o. M) Primary Care Chudney Scheffler: Merri Brunette Other Clinician: Referring Taleya Whitcher: Treating  Cyani Kallstrom/Extender: Mortimer Fries Weeks in Treatment: 3 Active Problems Location of Pain Severity and Description of Pain Patient Has Paino No Site Locations Rate the pain. Current Pain Level: 0 Pain Management and Medication Current Pain Management: Electronic Signature(s) Signed: 06/14/2022 4:58:18 PM By: Zenaida Deed RN, BSN Entered By: Zenaida Deed on 06/14/2022 15:11:46 -------------------------------------------------------------------------------- Patient/Caregiver Education Details Patient Name: Date of Service: Otilio Saber 4/16/2024andnbsp2:45 PM Medical Record Number: [id number] Patient Account Number: [id number] Date of Birth/Gender: Treating RN: [birth year] (87 y.o. Shamell, Lavallee, [place] [name] ([id number]) [id number] Page 5 of 8 Primary Care Physician: Merri Brunette Other Clinician: Referring Physician: Treating Physician/Extender: Willey Blade in Treatment: 3 Education Assessment Education Provided To: Patient Education Topics Provided Venous: Methods: Explain/Verbal Responses: Reinforcements needed, State content correctly Wound/Skin Impairment: Methods: Explain/Verbal Responses: Reinforcements needed, State content correctly Electronic Signature(s) Signed: 06/14/2022 4:58:18 PM By: Zenaida Deed RN, BSN Entered By: Zenaida Deed on 06/14/2022 15:24:15 -------------------------------------------------------------------------------- Wound Assessment Details Patient Name: Date of Service: [name] Fare, Gurman [name]. 06/14/2022 2:45 PM Medical Record Number: [id number] Patient Account Number: [id number] Date of Birth/Sex: Treating RN: [birth year] (87 y.o. [name] Primary Care Maralyn Witherell: Merri Brunette Other Clinician: Referring Trynity Skousen: Treating Kauan Kloosterman/Extender: Mortimer Fries Weeks in Treatment: 3 Wound Status Wound Number: 10 Primary  Lymphedema Etiology: Wound Location: Right, Anterior Lower Leg Wound Open Wounding Event: Not Known Status: Date Acquired: 06/04/2022 Comorbid Cataracts, Lymphedema, Arrhythmia, Congestive Heart Failure, Weeks Of Treatment: 1 History: Coronary Artery Disease, Hypertension, Osteoarthritis, Received Clustered Wound: No Radiation, Confinement Anxiety Wound Measurements Length: (cm) Width: (cm) Depth: (cm) Area: (cm) Volume: (cm) 0 % Reduction in Area: 100% 0 % Reduction in Volume: 100% 0 Epithelialization: Large (67-100%) 0 Tunneling: No 0 Undermining: No Wound Description Classification: Full Thickness Without Exposed Support Structures Wound Margin: Flat and Intact Exudate Amount: None Present Foul Odor After Cleansing: No Slough/Fibrino No Wound Bed Granulation Amount: None Present (0%) Exposed Structure Necrotic Amount: None Present (0%) Fascia Exposed: No Fat Layer (Subcutaneous Tissue) Exposed: No Tendon Exposed: No Muscle Exposed: No Joint Exposed: No Bone Exposed: No Periwound Skin Texture Texture Color No Abnormalities Noted: Yes No Abnormalities Noted: No Hemosiderin Staining: Yes Moisture No Abnormalities Noted: No Dry / Scaly: Yes Maceration: No Kyran, Smethers Maui [name] ([id number]) [id number] Page 6 of 8 Electronic Signature(s) Signed: 06/14/2022 4:58:18 PM By: Zenaida Deed RN, BSN Entered By: Zenaida Deed on 06/14/2022 15:21:13 -------------------------------------------------------------------------------- Wound Assessment Details Patient Name: Date of Service: [name] Fare, Donovon [name]. 06/14/2022 2:45 PM Medical Record Number: [id number] Patient Account Number: [id number] Date of Birth/Sex: Treating RN: Jan 06, 1936 (87 y.o. [name] Primary Care Bernell Haynie: Merri Brunette Other Clinician: Referring Abdiel Blackerby: Treating Dhrithi Riche/Extender: Mortimer Fries Weeks in Treatment: 3 Wound Status Wound Number:  5 Primary Lymphedema Etiology: Wound Location: Right Calcaneus Wound Open Wounding Event: Shear/Friction Status: Date Acquired: 05/24/2022 Comorbid Cataracts, Lymphedema, Arrhythmia, Congestive Heart Failure, Weeks Of Treatment: 3 History: Coronary Artery Disease, Hypertension, Osteoarthritis, Received Clustered Wound: No Radiation, Confinement Anxiety Wound Measurements Length: (cm) Width: (cm) Depth: (cm) Area: (cm) Volume: (cm) 0 % Reduction in Area: 100% 0 % Reduction in Volume: 100% 0 Epithelialization:  Large (67-100%) 0 Tunneling: No 0 Undermining: No Wound Description Classification: Full Thickness Without Exposed Support Structures Exudate Amount: None Present Foul Odor After Cleansing: No Slough/Fibrino No Wound Bed Granulation Amount: None Present (0%) Exposed Structure Necrotic Amount: None Present (0%) Fascia Exposed: No Fat Layer (Subcutaneous Tissue) Exposed: No Tendon Exposed: No Muscle Exposed: No Joint Exposed: No Bone Exposed: No Periwound Skin Texture Texture Color No Abnormalities Noted: No No Abnormalities Noted: Yes Callus: Yes Temperature / Pain Temperature: No Abnormality Moisture No Abnormalities Noted: Yes Tenderness on Palpation: Yes Electronic Signature(s) Signed: 06/14/2022 4:58:18 PM By: Zenaida Deed RN, BSN Entered By: Zenaida Deed on 06/14/2022 15:21:55 -------------------------------------------------------------------------------- Wound Assessment Details Patient Name: Date of Service: [name] Fare, Shafter [name]. 06/14/2022 2:45 PM Medical Record Number: [id number] Patient Account Number: [id number] Date of Birth/Sex: Treating RN: 24-Mar-1935 (87 y.o. [name] Primary Care Jannelle Notaro: Merri Brunette Other Clinician: Referring Cullan Launer: Treating Kennard Fildes/Extender: Otho Ket, Candace Weeks in Treatment: 3 Wound Status Wound Number: 8 Primary Lymphedema Etiology: KOLE, HILYARD ([id number])  [id number] Page 7 of 8 Etiology: Wound Location: Left, Lateral Lower Leg Wound Open Wounding Event: Trauma Status: Date Acquired: 05/09/2022 Comorbid Cataracts, Lymphedema, Arrhythmia, Congestive Heart Failure, Weeks Of Treatment: 3 History: Coronary Artery Disease, Hypertension, Osteoarthritis, Received Clustered Wound: No Radiation, Confinement Anxiety Wound Measurements Length: (cm) 2.2 Width: (cm) 2.3 Depth: (cm) 0.1 Area: (cm) 3.974 Volume: (cm) 0.397 % Reduction in Area: 35.1% % Reduction in Volume: 35.2% Epithelialization: Medium (34-66%) Tunneling: No Undermining: No Wound Description Classification: Full Thickness Without Exposed Support Structures Wound Margin: Distinct, outline attached Exudate Amount: Medium Exudate Type: Serosanguineous Exudate Color: red, brown Foul Odor After Cleansing: No Slough/Fibrino Yes Wound Bed Granulation Amount: Medium (34-66%) Exposed Structure Granulation Quality: Red Fascia Exposed: No Necrotic Amount: None Present (0%) Fat Layer (Subcutaneous Tissue) Exposed: Yes Tendon Exposed: No Muscle Exposed: No Joint Exposed: No Bone Exposed: No Periwound Skin Texture Texture Color No Abnormalities Noted: Yes No Abnormalities Noted: No Atrophie Blanche: No Moisture Cyanosis: No No Abnormalities Noted: Yes Ecchymosis: No Erythema: No Hemosiderin Staining: Yes Mottled: No Pallor: No Rubor: No Temperature / Pain Temperature: No Abnormality Tenderness on Palpation: Yes Treatment Notes Wound #8 (Lower Leg) Wound Laterality: Left, Lateral Cleanser Soap and Water Discharge Instruction: May shower and wash wound with dial antibacterial soap and water prior to dressing change. Wound Cleanser Discharge Instruction: Cleanse the wound with wound cleanser prior to applying a clean dressing using gauze sponges, not tissue or cotton balls. Peri-Wound Care Sween Lotion (Moisturizing lotion) Discharge  Instruction: Apply moisturizing lotion as directed Topical Primary Dressing Maxorb Extra Ag+ Alginate Dressing, 2x2 (in/in) Discharge Instruction: Apply to wound bed as instructed Secondary Dressing Zetuvit Plus Silicone Border Dressing 4x4 (in/in) Discharge Instruction: Apply silicone border over primary dressing as directed. Secured With Compression Wrap Compression Stockings BURL, TAUZIN [name] ([id number]) [id number] Page 8 of 8 Add-Ons Electronic Signature(s) Signed: 06/14/2022 4:58:18 PM By: Zenaida Deed RN, BSN Entered By: Zenaida Deed on 06/14/2022 15:22:35 -------------------------------------------------------------------------------- Vitals Details Patient Name: Date of Service: [name] Fare, Maxtyn [name]. 06/14/2022 2:45 PM Medical Record Number: [id number] Patient Account Number: [id number] Date of Birth/Sex: Treating RN: 1935-08-19 (87 y.o. M) Primary Care Nuno Brubacher: Merri Brunette Other Clinician: Referring Helaina Stefano: Treating Musab Wingard/Extender: Otho Ket, Candace Weeks in Treatment: 3 Vital Signs Time Taken: 03:01 Temperature (F): 97.5 Height (in): 71 Pulse (bpm): 75 Weight (lbs): 190 Respiratory Rate (breaths/min): 18 Body Mass Index (BMI): 26.5 Blood Pressure (mmHg): 118/47 Reference Range: 80 - 120 mg / dl  Electronic Signature(s) Signed: 06/14/2022 4:58:18 PM By: Zenaida Deed RN, BSN Entered By: Zenaida Deed on 06/14/2022 15:11:40

## 2022-06-15 NOTE — Progress Notes (Signed)
[name], [name] ([id number]) [id number] Page 1 of 9 Visit Report for 06/14/2022 Chief Complaint Document Details Patient Name: Date of Service: [name], [name] 06/14/2022 2:45 PM Medical Record Number: [id number] Patient Account Number: [id number] Date of Birth/Sex: Treating RN: 08/06/1935 (87 y.o. M) Primary Care Provider: Merri [name] Other Clinician: Referring Provider: Treating Provider/Extender: [name] Weeks in Treatment: 3 Information Obtained from: Patient Chief Complaint Patient presents for treatment of open ulcers due to venous insufficiency and lymphedema Electronic Signature(s) Signed: 06/14/2022 3:47:59 PM By: [name] Guess MD FACS Entered By: [name] on 06/14/2022 15:47:59 -------------------------------------------------------------------------------- Debridement Details Patient Name: Date of Service: [name], [name] 06/14/2022 2:45 PM Medical Record Number: [id number] Patient Account Number: [id number] Date of Birth/Sex: Treating RN: [birth year] (87 y.o. [name] Primary Care Provider: Merri [name] Other Clinician: Referring Provider: Treating Provider/Extender: [name] Weeks in Treatment: 3 Debridement Performed for Assessment: Wound #8 Left,Lateral Lower Leg Performed By: Physician [name] Guess, MD Debridement Type: Debridement Level of Consciousness (Pre-procedure): Awake and Alert Pre-procedure Verification/Time Out Yes - 15:30 Taken: Start Time: 15:30 Pain Control: Lidocaine 4% T opical Solution T Area Debrided (L x W): otal 2.2 (cm) x 2.3 (cm) = 5.06 (cm) Tissue and other material debrided: Non-Viable, Skin: Epidermis Level: Skin/Epidermis Debridement Description: Selective/Open Wound Instrument: Forceps, Scissors Bleeding: Minimum Hemostasis Achieved: Pressure Procedural Pain: 0 Post Procedural Pain: 0 Response to Treatment:  Procedure was tolerated well Level of Consciousness (Post- Awake and Alert procedure): Post Debridement Measurements of Total Wound Length: (cm) 2.2 Width: (cm) 2.3 Depth: (cm) 0.1 Volume: (cm) 0.397 Character of Wound/Ulcer Post Debridement: Improved Post Procedure Diagnosis Same as Pre-procedure Notes Scribed for Dr. Lady [name] by [name] Deed, RN Electronic Signature(s) Signed: 06/14/2022 3:51:00 PM By: [name] Guess MD FACS Signed: 06/14/2022 4:58:18 PM By: [name] Deed RN, BSN Entered By: [name] on 06/14/2022 15:33:30 [name], [name] ([id number]) [id number] Page 2 of 9 -------------------------------------------------------------------------------- HPI Details Patient Name: Date of Service: [name] 06/14/2022 2:45 PM Medical Record Number: [id number] Patient Account Number: [id number] Date of Birth/Sex: Treating RN: January 27, 1936 (87 y.o. M) Primary Care Provider: Merri [name] Other Clinician: Referring Provider: Treating Provider/Extender: [name] Weeks in Treatment: 3 History of Present Illness HPI Description: ADMISSION This is an 87 year old man with a past medical history significant for congestive heart failure, CKD stage III, Parkinson's disease, venous insufficiency, pulmonary hypertension, chronic A-fib/a flutter on Eliquis, coronary artery disease, and lymphedema. He has been followed in the vascular surgery clinic by Dr. Edilia [name]. Per Dr. Adele [name] last note, lymphedema pumps have been ordered, but he has not yet received them. He referred the patient to the wound care clinic for closer management of his lymphedema and venous ulcers. Venous reflux studies have been performed and unfortunately, the reflux is deep and therefore not amenable to laser ablation. ABIs in clinic today were noncompressible, but he had excellent Doppler signals. He has a small wound on his left anterior tibial  surface with a bit of eschar present. He has another small wound on his right anterior tibial surface that is quite clean. He has a larger superficial ulcer on his right posterior calf that has a small amount of slough present. 04/07/2022: The only remaining open wounds are a small superficial lesion on his right posterior calf and a small ulcer on his left anterior tibial surface. They are both clean without any slough or eschar accumulation. Edema control is good. 04/15/2022: His wounds are healed. READMISSION  05/24/2022 He returns today with multiple open wounds, most of which are from lack of understanding and compliance with the need to wear his juxta lite stockings throughout the day in addition to using his lymphedema pumps. He has been just using his lymphedema pumps without consistent compression garment use. He also struck his left lateral leg on a car door and has a wound related to this. He has multiple small wounds on his lateral right leg and bilateral posterior calves. He also has an ulcer on his plantar right calcaneus. There is slough and eschar on all surfaces. His edema is completely uncontrolled. 05/30/2022: He has 2 ulcers on the back of his right leg that are open and the large left lateral lower leg wound as well as the right heel ulcer. The other wounds have closed. There is slough accumulation on all surfaces, along with a little bit of eschar on the leg wounds. There was some misunderstanding between the patient's wife and myself; she applied his juxta lite stockings over his 4-layer compression and had him using his lymphedema pumps twice a day. This resulted in a significant decrease in his swelling, but was probably more compression than indicated. I clarified that the juxta lite stockings were to be applied after his wounds had healed and he was out of our compression wraps. He should, however, continue to use his lymphedema pumps over our wraps now and his juxta lite  stockings once he is healed. 06/07/2022: Under a layer of eschar and dry skin, all of the posterior leg wounds are closed. The left lateral lower leg wound has a layer of eschar and dried silver alginate present. Underneath this, the wound has closed up considerably. There is some slough on the wound surface. He has a new abrasion to his right anterior tibial surface which they think was caused by his lymphedema pumps. The wound is limited to skin breakdown only. 06/14/2022: The only open wound is a skin tear on his lateral left lower leg. Everything else is healed. Electronic Signature(s) Signed: 06/14/2022 3:48:47 PM By: [name] Guess MD FACS Entered By: [name] on 06/14/2022 15:48:47 -------------------------------------------------------------------------------- Physical Exam Details Patient Name: Date of Service: [name], [name] 06/14/2022 2:45 PM Medical Record Number: [id number] Patient Account Number: [id number] Date of Birth/Sex: Treating RN: [birth year] (87 y.o. M) Primary Care Provider: Merri [name] Other Clinician: Referring Provider: Treating Provider/Extender: Otho Ket, Candace Weeks in Treatment: 3 Constitutional . . . . no acute distress. Respiratory Normal work of breathing on room air. Notes 06/14/2022: The only open wound is a skin tear on his lateral left lower leg. Everything else is healed. Electronic Signature(s) Signed: 06/14/2022 3:49:24 PM By: [name] Guess MD FACS Entered By: [name] on 06/14/2022 15:49:24 [name] ([id number]) [id number] Page 3 of 9 -------------------------------------------------------------------------------- Physician Orders Details Patient Name: Date of Service: [name], [name] 06/14/2022 2:45 PM Medical Record Number: [id number] Patient Account Number: [id number] Date of Birth/Sex: Treating RN: [birth year] (87 y.o. [name] Primary Care  Provider: Merri [name] Other Clinician: Referring Provider: Treating Provider/Extender: [name] Weeks in Treatment: 3 Verbal / Phone Orders: No Diagnosis Coding ICD-10 Coding Code Description [phone] Non-pressure chronic ulcer of other part of left lower leg with fat layer exposed I89.0 Lymphedema, not elsewhere classified I87.2 Venous insufficiency (chronic) (peripheral) G20.C Parkinsonism, unspecified N18.30 Chronic kidney disease, stage 3 unspecified I50.32 Chronic diastolic (congestive) heart failure Follow-up Appointments ppointment in 1 week. - Dr. Lady [name] Room 1 Return A  Friday 4/26 @ 2:00 pm Anesthetic (In clinic) Topical Lidocaine 4% applied to wound bed Bathing/ Shower/ Hygiene May shower and wash wound with soap and water. Edema Control - Lymphedema / SCD / Other Bilateral Lower Extremities Lymphedema Pumps. Use Lymphedema pumps on leg(s) 2-3 times a day for 45-60 minutes. If wearing any wraps or hose, do not remove them. Continue exercising as instructed. Elevate legs to the level of the heart or above for 30 minutes daily and/or when sitting for 3-4 times a day throughout the day. - whenever sitting Avoid standing for long periods of time. Exercise regularly Moisturize legs daily. Compression stocking or Garment 30-40 mm/Hg pressure to: - juxtalites both legs daily Wound Treatment Wound #8 - Lower Leg Wound Laterality: Left, Lateral Cleanser: Soap and Water 1 x Per Week/30 Days Discharge Instructions: May shower and wash wound with dial antibacterial soap and water prior to dressing change. Cleanser: Wound Cleanser 1 x Per Week/30 Days Discharge Instructions: Cleanse the wound with wound cleanser prior to applying a clean dressing using gauze sponges, not tissue or cotton balls. Peri-Wound Care: Sween Lotion (Moisturizing lotion) 1 x Per Week/30 Days Discharge Instructions: Apply moisturizing lotion as directed Prim Dressing: Maxorb Extra  Ag+ Alginate Dressing, 2x2 (in/in) 1 x Per Week/30 Days ary Discharge Instructions: Apply to wound bed as instructed Secondary Dressing: Zetuvit Plus Silicone Border Dressing 4x4 (in/in) 1 x Per Week/30 Days Discharge Instructions: Apply silicone border over primary dressing as directed. Electronic Signature(s) Signed: 06/14/2022 3:51:00 PM By: [name] Guess MD FACS Entered By: [name] on 06/14/2022 15:49:37 -------------------------------------------------------------------------------- Problem List Details Patient Name: Date of Service: [name], Fain [name]. 06/14/2022 2:45 PM [name] ([id number]) [id number] Page 4 of 9 Medical Record Number: [id number] Patient Account Number: [id number] Date of Birth/Sex: Treating RN: May 19, 1935 (87 y.o. [name] Primary Care Provider: Merri [name] Other Clinician: Referring Provider: Treating Provider/Extender: [name] Weeks in Treatment: 3 Active Problems ICD-10 Encounter Code Description Active Date MDM Diagnosis [phone] Non-pressure chronic ulcer of other part of left lower leg with fat layer exposed3/26/2024 No Yes I89.0 Lymphedema, not elsewhere classified 05/24/2022 No Yes I87.2 Venous insufficiency (chronic) (peripheral) 05/24/2022 No Yes G20.C Parkinsonism, unspecified 05/24/2022 No Yes N18.30 Chronic kidney disease, stage 3 unspecified 05/24/2022 No Yes I50.32 Chronic diastolic (congestive) heart failure 05/24/2022 No Yes Inactive Problems Resolved Problems ICD-10 Code Description Active Date Resolved Date L97.812 Non-pressure chronic ulcer of other part of right lower leg with fat layer exposed 05/24/2022 05/24/2022 Z60.109 Non-pressure chronic ulcer of right calf with fat layer exposed 05/24/2022 05/24/2022 N23.557 Non-pressure chronic ulcer of left calf with fat layer exposed 05/24/2022 05/24/2022 L97.412 Non-pressure chronic ulcer of right heel and midfoot  with fat layer exposed 05/24/2022 05/24/2022 L97.811 Non-pressure chronic ulcer of other part of right lower leg limited to breakdown of skin 06/07/2022 06/07/2022 Electronic Signature(s) Signed: 06/14/2022 3:47:42 PM By: [name] Guess MD FACS Previous Signature: 06/14/2022 3:26:02 PM Version By: [name] Guess MD FACS Entered By: [name] on 06/14/2022 15:47:42 -------------------------------------------------------------------------------- Progress Note Details Patient Name: Date of Service: [name], [name] 06/14/2022 2:45 PM Medical Record Number: [id number] Patient Account Number: [id number] Date of Birth/Sex: Treating RN: Jun 24, 1935 (87 y.o. M) Primary Care Provider: Merri [name] Other Clinician: Referring Provider: Treating Provider/Extender: [name] Weeks in Treatment: 3 [name], [name] ([id number]) [id number] Page 5 of 9 Subjective Chief Complaint Information obtained from Patient Patient presents for treatment of open ulcers due to venous insufficiency and lymphedema History  of Present Illness (HPI) ADMISSION This is an 87 year old man with a past medical history significant for congestive heart failure, CKD stage III, Parkinson's disease, venous insufficiency, pulmonary hypertension, chronic A-fib/a flutter on Eliquis, coronary artery disease, and lymphedema. He has been followed in the vascular surgery clinic by Dr. Edilia [name]. Per Dr. Adele [name] last note, lymphedema pumps have been ordered, but he has not yet received them. He referred the patient to the wound care clinic for closer management of his lymphedema and venous ulcers. Venous reflux studies have been performed and unfortunately, the reflux is deep and therefore not amenable to laser ablation. ABIs in clinic today were noncompressible, but he had excellent Doppler signals. He has a small wound on his left anterior tibial surface with a bit of eschar  present. He has another small wound on his right anterior tibial surface that is quite clean. He has a larger superficial ulcer on his right posterior calf that has a small amount of slough present. 04/07/2022: The only remaining open wounds are a small superficial lesion on his right posterior calf and a small ulcer on his left anterior tibial surface. They are both clean without any slough or eschar accumulation. Edema control is good. 04/15/2022: His wounds are healed. READMISSION 05/24/2022 He returns today with multiple open wounds, most of which are from lack of understanding and compliance with the need to wear his juxta lite stockings throughout the day in addition to using his lymphedema pumps. He has been just using his lymphedema pumps without consistent compression garment use. He also struck his left lateral leg on a car door and has a wound related to this. He has multiple small wounds on his lateral right leg and bilateral posterior calves. He also has an ulcer on his plantar right calcaneus. There is slough and eschar on all surfaces. His edema is completely uncontrolled. 05/30/2022: He has 2 ulcers on the back of his right leg that are open and the large left lateral lower leg wound as well as the right heel ulcer. The other wounds have closed. There is slough accumulation on all surfaces, along with a little bit of eschar on the leg wounds. There was some misunderstanding between the patient's wife and myself; she applied his juxta lite stockings over his 4-layer compression and had him using his lymphedema pumps twice a day. This resulted in a significant decrease in his swelling, but was probably more compression than indicated. I clarified that the juxta lite stockings were to be applied after his wounds had healed and he was out of our compression wraps. He should, however, continue to use his lymphedema pumps over our wraps now and his juxta lite stockings once he is  healed. 06/07/2022: Under a layer of eschar and dry skin, all of the posterior leg wounds are closed. The left lateral lower leg wound has a layer of eschar and dried silver alginate present. Underneath this, the wound has closed up considerably. There is some slough on the wound surface. He has a new abrasion to his right anterior tibial surface which they think was caused by his lymphedema pumps. The wound is limited to skin breakdown only. 06/14/2022: The only open wound is a skin tear on his lateral left lower leg. Everything else is healed. Patient History Information obtained from Patient, Caregiver, Chart. Family History Heart Disease - Father, Stroke, No family history of Cancer, Diabetes, Hereditary Spherocytosis, Hypertension, Kidney Disease, Lung Disease, Seizures, Thyroid Problems, Tuberculosis. Social History Never smoker, Marital Status -  Married, Alcohol Use - Daily - 2 cocktails per day, Drug Use - No History, Caffeine Use - Daily. Medical History Eyes Patient has history of Cataracts - bil extractions Denies history of Glaucoma, Optic Neuritis Hematologic/Lymphatic Patient has history of Lymphedema - lower legs Cardiovascular Patient has history of Arrhythmia - afib, Congestive Heart Failure, Coronary Artery Disease, Hypertension Endocrine Denies history of Type I Diabetes, Type II Diabetes Genitourinary Denies history of End Stage Renal Disease Integumentary (Skin) Denies history of History of Burn Musculoskeletal Patient has history of Osteoarthritis - both knees Denies history of Gout, Rheumatoid Arthritis, Osteomyelitis Oncologic Patient has history of Received Radiation - seed implants Denies history of Received Chemotherapy Psychiatric Patient has history of Confinement Anxiety Denies history of Anorexia/bulimia Hospitalization/Surgery History - left femur IM nail. - right shoulder melanoma excision. - cardioversion. - hemorrhoid surgery. - cardiac anfioplasty.  - coronary atherectomy. - bil cataract extraction. - insertion prostate radiation seed. - lap cholecystectomy. - inguinal hernia repair. Medical A Surgical History Notes nd Respiratory elevated hemidiaphragm Cardiovascular pulmonary hypertension, mitral valve regurgitation, dilated cardiomyopathy Genitourinary CKD stage 3 Neurologic parkinson's [name], [name] ([id number]) [id number] Page 6 of 9 Oncologic skin CA removed, prostate CA Objective Constitutional no acute distress. Vitals Time Taken: 3:01 AM, Height: 71 in, Weight: 190 lbs, BMI: 26.5, Temperature: 97.5 F, Pulse: 75 bpm, Respiratory Rate: 18 breaths/min, Blood Pressure: 118/47 mmHg. Respiratory Normal work of breathing on room air. General Notes: 06/14/2022: The only open wound is a skin tear on his lateral left lower leg. Everything else is healed. Integumentary (Hair, Skin) Wound #10 status is Open. Original cause of wound was Not Known. The date acquired was: 06/04/2022. The wound has been in treatment 1 weeks. The wound is located on the Right,Anterior Lower Leg. The wound measures 0cm length x 0cm width x 0cm depth; 0cm^2 area and 0cm^3 volume. There is no tunneling or undermining noted. There is a none present amount of drainage noted. The wound margin is flat and intact. There is no granulation within the wound bed. There is no necrotic tissue within the wound bed. The periwound skin appearance had no abnormalities noted for texture. The periwound skin appearance exhibited: Dry/Scaly, Hemosiderin Staining. The periwound skin appearance did not exhibit: Maceration. Wound #5 status is Open. Original cause of wound was Shear/Friction. The date acquired was: 05/24/2022. The wound has been in treatment 3 weeks. The wound is located on the Right Calcaneus. The wound measures 0cm length x 0cm width x 0cm depth; 0cm^2 area and 0cm^3 volume. There is no tunneling or undermining noted. There is a  none present amount of drainage noted. There is no granulation within the wound bed. There is no necrotic tissue within the wound bed. The periwound skin appearance had no abnormalities noted for moisture. The periwound skin appearance had no abnormalities noted for color. The periwound skin appearance exhibited: Callus. Periwound temperature was noted as No Abnormality. The periwound has tenderness on palpation. Wound #8 status is Open. Original cause of wound was Trauma. The date acquired was: 05/09/2022. The wound has been in treatment 3 weeks. The wound is located on the Left,Lateral Lower Leg. The wound measures 2.2cm length x 2.3cm width x 0.1cm depth; 3.974cm^2 area and 0.397cm^3 volume. There is Fat Layer (Subcutaneous Tissue) exposed. There is no tunneling or undermining noted. There is a medium amount of serosanguineous drainage noted. The wound margin is distinct with the outline attached to the wound base. There is medium (34-66%) red granulation within the  wound bed. There is no necrotic tissue within the wound bed. The periwound skin appearance had no abnormalities noted for texture. The periwound skin appearance had no abnormalities noted for moisture. The periwound skin appearance exhibited: Hemosiderin Staining. The periwound skin appearance did not exhibit: Atrophie Blanche, Cyanosis, Ecchymosis, Mottled, Pallor, Rubor, Erythema. Periwound temperature was noted as No Abnormality. The periwound has tenderness on palpation. Assessment Active Problems ICD-10 Non-pressure chronic ulcer of other part of left lower leg with fat layer exposed Lymphedema, not elsewhere classified Venous insufficiency (chronic) (peripheral) Parkinsonism, unspecified Chronic kidney disease, stage 3 unspecified Chronic diastolic (congestive) heart failure Procedures Wound #8 Pre-procedure diagnosis of Wound #8 is a Lymphedema located on the Left,Lateral Lower Leg . There was a Selective/Open Wound  Skin/Epidermis Debridement with a total area of 5.06 sq cm performed by [name] Guess, MD. With the following instrument(s): Forceps, and Scissors to remove Non- Viable tissue/material. Material removed includes Skin: Epidermis after achieving pain control using Lidocaine 4% Topical Solution. A time out was conducted at 15:30, prior to the start of the procedure. A Minimum amount of bleeding was controlled with Pressure. The procedure was tolerated well with a pain level of 0 throughout and a pain level of 0 following the procedure. Post Debridement Measurements: 2.2cm length x 2.3cm width x 0.1cm depth; 0.397cm^3 volume. Character of Wound/Ulcer Post Debridement is improved. Post procedure Diagnosis Wound #8: Same as Pre-Procedure General Notes: Scribed for Dr. Lady [name] by [name] Deed, RN. Plan [name], [name] ([id number]) [id number] Page 7 of 9 Follow-up Appointments: Return Appointment in 1 week. - Dr. Lady [name] Room 1 Friday 4/26 @ 2:00 pm Anesthetic: (In clinic) Topical Lidocaine 4% applied to wound bed Bathing/ Shower/ Hygiene: May shower and wash wound with soap and water. Edema Control - Lymphedema / SCD / Other: Lymphedema Pumps. Use Lymphedema pumps on leg(s) 2-3 times a day for 45-60 minutes. If wearing any wraps or hose, do not remove them. Continue exercising as instructed. Elevate legs to the level of the heart or above for 30 minutes daily and/or when sitting for 3-4 times a day throughout the day. - whenever sitting Avoid standing for long periods of time. Exercise regularly Moisturize legs daily. Compression stocking or Garment 30-40 mm/Hg pressure to: - juxtalites both legs daily WOUND #8: - Lower Leg Wound Laterality: Left, Lateral Cleanser: Soap and Water 1 x Per Week/30 Days Discharge Instructions: May shower and wash wound with dial antibacterial soap and water prior to dressing change. Cleanser: Wound Cleanser 1 x Per Week/30  Days Discharge Instructions: Cleanse the wound with wound cleanser prior to applying a clean dressing using gauze sponges, not tissue or cotton balls. Peri-Wound Care: Sween Lotion (Moisturizing lotion) 1 x Per Week/30 Days Discharge Instructions: Apply moisturizing lotion as directed Prim Dressing: Maxorb Extra Ag+ Alginate Dressing, 2x2 (in/in) 1 x Per Week/30 Days ary Discharge Instructions: Apply to wound bed as instructed Secondary Dressing: Zetuvit Plus Silicone Border Dressing 4x4 (in/in) 1 x Per Week/30 Days Discharge Instructions: Apply silicone border over primary dressing as directed. 06/14/2022: The only open wound is a skin tear on his lateral left lower leg. Everything else is healed. I used scissors and forceps to debride nonviable skin that was hanging from the skin tear. We will apply silver alginate to this site. The patient and his wife both requested that he be allowed to just use his juxta lite stockings for compression as the leg wraps make it difficult for him to ambulate easily. I think this will  be fine. He will continue to use his lymphedema pumps. Follow-up in 1 week. Electronic Signature(s) Signed: 06/14/2022 3:50:32 PM By: [name] Guess MD FACS Entered By: [name] on 06/14/2022 15:50:32 -------------------------------------------------------------------------------- HxROS Details Patient Name: Date of Service: [name], Delno [name]. 06/14/2022 2:45 PM Medical Record Number: [id number] Patient Account Number: [id number] Date of Birth/Sex: Treating RN: Jan 21, 1936 (87 y.o. M) Primary Care Provider: Merri [name] Other Clinician: Referring Provider: Treating Provider/Extender: [name] Weeks in Treatment: 3 Information Obtained From Patient Caregiver Chart Eyes Medical History: Positive for: Cataracts - bil extractions Negative for: Glaucoma; Optic Neuritis Hematologic/Lymphatic Medical History: Positive for: Lymphedema -  lower legs Respiratory Medical History: Past Medical History Notes: elevated hemidiaphragm Cardiovascular Medical History: Positive for: Arrhythmia - afib; Congestive Heart Failure; Coronary Artery Disease; Hypertension Past Medical History Notes: pulmonary hypertension, mitral valve regurgitation, dilated cardiomyopathy Endocrine [name], [name] ([id number]) [id number] Page 8 of 9 Medical History: Negative for: Type I Diabetes; Type II Diabetes Genitourinary Medical History: Negative for: End Stage Renal Disease Past Medical History Notes: CKD stage 3 Integumentary (Skin) Medical History: Negative for: History of Burn Musculoskeletal Medical History: Positive for: Osteoarthritis - both knees Negative for: Gout; Rheumatoid Arthritis; Osteomyelitis Neurologic Medical History: Past Medical History Notes: parkinson's Oncologic Medical History: Positive for: Received Radiation - seed implants Negative for: Received Chemotherapy Past Medical History Notes: skin CA removed, prostate CA Psychiatric Medical History: Positive for: Confinement Anxiety Negative for: Anorexia/bulimia HBO Extended History Items Eyes: Cataracts Immunizations Pneumococcal Vaccine: Received Pneumococcal Vaccination: Yes Received Pneumococcal Vaccination On or After 60th Birthday: Yes Implantable Devices No devices added Hospitalization / Surgery History Type of Hospitalization/Surgery left femur IM nail right shoulder melanoma excision cardioversion hemorrhoid surgery cardiac anfioplasty coronary atherectomy bil cataract extraction insertion prostate radiation seed lap cholecystectomy inguinal hernia repair Family and Social History Cancer: No; Diabetes: No; Heart Disease: Yes - Father; Hereditary Spherocytosis: No; Hypertension: No; Kidney Disease: No; Lung Disease: No; Seizures: No; Stroke: Yes; Thyroid Problems: No; Tuberculosis: No; Never smoker;  Marital Status - Married; Alcohol Use: Daily - 2 cocktails per day; Drug Use: No History; Caffeine Use: Daily; Financial Concerns: No; Food, Clothing or Shelter Needs: No; Support System Lacking: No; Transportation Concerns: No Electronic Signature(s) Signed: 06/14/2022 3:51:00 PM By: [name] Guess MD FACS Entered By: [name] on 06/14/2022 15:48:56 [name] ([id number]) [id number] Page 9 of 9 -------------------------------------------------------------------------------- SuperBill Details Patient Name: Date of Service: [name], [name] 06/14/2022 Medical Record Number: [id number] Patient Account Number: [id number] Date of Birth/Sex: Treating RN: 07-Jul-1935 (87 y.o. M) Primary Care Provider: Merri [name] Other Clinician: Referring Provider: Treating Provider/Extender: [name] Weeks in Treatment: 3 Diagnosis Coding ICD-10 Codes Code Description [phone] Non-pressure chronic ulcer of other part of left lower leg with fat layer exposed I89.0 Lymphedema, not elsewhere classified I87.2 Venous insufficiency (chronic) (peripheral) G20.C Parkinsonism, unspecified N18.30 Chronic kidney disease, stage 3 unspecified I50.32 Chronic diastolic (congestive) heart failure Facility Procedures : CPT4 Code: [id number] Description: [phone] - [facility]BR[facility] WOUND 1ST 20 SQ CM OR < ICD-10 Diagnosis Description L97.822 Non-pressure chronic ulcer of other part of left lower leg with fat layer expose Modifier: d Quantity: 1 Physician Procedures : CPT4 Code Description Modifier [id number] - WC PHYS LEVEL 3 - EST PT 25 ICD-10 Diagnosis Description L97.822 Non-pressure chronic ulcer of other part of left lower leg with fat layer exposed I89.0 Lymphedema, not elsewhere classified I87.2 Venous  insufficiency (chronic) (peripheral) I50.32 Chronic diastolic (congestive) heart failure Quantity: 1 : [id number]  [id number] - WC PHYS DEBR WO ANESTH 20 SQ  CM ICD-10 Diagnosis Description L97.822 Non-pressure chronic ulcer of other part of left lower leg with fat layer exposed Quantity: 1 Electronic Signature(s) Signed: 06/14/2022 3:50:51 PM By: [name] Guess MD FACS Entered By: [name] on 06/14/2022 15:50:51

## 2022-06-16 NOTE — Progress Notes (Signed)
[name], [name] ([id number]) [id number] Page 1 of 13 Visit Report for 06/07/2022 Chief Complaint Document Details Patient Name: Date of Service: [name] 06/07/2022 9:00 A M Medical Record Number: [id number] Patient Account Number: [id number] Date of Birth/Sex: Treating RN: 01/21/1936 (87 y.o. M) Primary Care Provider: Merri Brunette Other Clinician: Referring Provider: Treating Provider/Extender: Mortimer Fries Weeks in Treatment: 2 Information Obtained from: Patient Chief Complaint Patient presents for treatment of open ulcers due to venous insufficiency and lymphedema Electronic Signature(s) Signed: 06/07/2022 10:17:04 AM By: Duanne Guess MD FACS Entered By: Duanne Guess on 06/07/2022 10:17:04 -------------------------------------------------------------------------------- Debridement Details Patient Name: Date of Service: [name], [name] 06/07/2022 9:00 A M Medical Record Number: [id number] Patient Account Number: [id number] Date of Birth/Sex: Treating RN: 1936-01-13 (87 y.o. [name] Primary Care Provider: Merri Brunette Other Clinician: Referring Provider: Treating Provider/Extender: Mortimer Fries Weeks in Treatment: 2 Debridement Performed for Assessment: Wound #8 Left,Lateral Lower Leg Performed By: Physician Duanne Guess, MD Debridement Type: Debridement Level of Consciousness (Pre-procedure): Awake and Alert Pre-procedure Verification/Time Out No Taken: T Area Debrided (L x W): otal 2.4 (cm) x 2 (cm) = 4.8 (cm) Tissue and other material debrided: Non-Viable, Eschar, Slough, Slough Level: Non-Viable Tissue Debridement Description: Selective/Open Wound Instrument: Curette Bleeding: Minimum Hemostasis Achieved: Pressure Response to Treatment: Procedure was tolerated well Level of Consciousness (Post- Awake and Alert procedure): Post Debridement Measurements of Total  Wound Length: (cm) 2.4 Width: (cm) 2 Depth: (cm) 0.1 Volume: (cm) 0.377 Character of Wound/Ulcer Post Debridement: Improved Post Procedure Diagnosis Same as Pre-procedure Notes Scribed for Dr. Lady Gary by Brenton Grills RN. Electronic Signature(s) Signed: 06/07/2022 12:21:18 PM By: Duanne Guess MD FACS Signed: 06/16/2022 1:56:54 PM By: Brenton Grills Entered By: Brenton Grills on 06/07/2022 09:56:55 [name] ([id number]) [id number] Page 2 of 13 -------------------------------------------------------------------------------- Debridement Details Patient Name: Date of Service: [name], [name] 06/07/2022 9:00 A M Medical Record Number: [id number] Patient Account Number: [id number] Date of Birth/Sex: Treating RN: 10-Jun-1935 (87 y.o. [name] Primary Care Provider: Merri Brunette Other Clinician: Referring Provider: Treating Provider/Extender: Mortimer Fries Weeks in Treatment: 2 Debridement Performed for Assessment: Wound #10 Right,Anterior Lower Leg Performed By: Physician Duanne Guess, MD Debridement Type: Debridement Level of Consciousness (Pre-procedure): Awake and Alert Pre-procedure Verification/Time Out No Taken: T Area Debrided (L x W): otal 3.2 (cm) x 1.2 (cm) = 3.84 (cm) Tissue and other material debrided: Non-Viable, Eschar, Slough, Slough Level: Non-Viable Tissue Debridement Description: Selective/Open Wound Instrument: Curette Bleeding: Minimum Hemostasis Achieved: Pressure Response to Treatment: Procedure was tolerated well Level of Consciousness (Post- Awake and Alert procedure): Post Debridement Measurements of Total Wound Length: (cm) 3.2 Width: (cm) 1.2 Depth: (cm) 0.1 Volume: (cm) 0.302 Character of Wound/Ulcer Post Debridement: Stable Post Procedure Diagnosis Same as Pre-procedure Notes Scribed for Dr. Lady Gary by Brenton Grills RN. Electronic Signature(s) Signed: 06/07/2022  12:21:18 PM By: Duanne Guess MD FACS Signed: 06/16/2022 1:56:54 PM By: Brenton Grills Entered By: Brenton Grills on 06/07/2022 09:58:31 -------------------------------------------------------------------------------- Debridement Details Patient Name: Date of Service: [name], [name] 06/07/2022 9:00 A M Medical Record Number: [id number] Patient Account Number: [id number] Date of Birth/Sex: Treating RN: [birth year] (87 y.o. [name] Primary Care Provider: Merri Brunette Other Clinician: Referring Provider: Treating Provider/Extender: Mortimer Fries Weeks in Treatment: 2 Debridement Performed for Assessment: Wound #6 Right,Posterior Lower Leg Performed By: Physician Duanne Guess, MD Debridement Type: Debridement Level of Consciousness (Pre-procedure): Awake and Alert Pre-procedure Verification/Time Out No Taken:  T Area Debrided (L x W): otal 0.1 (cm) x 0.1 (cm) = 0.01 (cm) Tissue and other material debrided: Non-Viable, Eschar, Slough, Slough Level: Non-Viable Tissue Debridement Description: Selective/Open Wound Instrument: Curette Bleeding: Minimum Hemostasis Achieved: Pressure Response to Treatment: Procedure was tolerated well Level of Consciousness (Post- Awake and Alert procedure): Post Debridement Measurements of Total Wound Length: (cm) 0.1 Width: (cm) 0.1 Depth: (cm) 0.1 [name] ([id number]) [id number] Page 3 of 13 Volume: (cm) 0.001 Character of Wound/Ulcer Post Debridement: Improved Post Procedure Diagnosis Same as Pre-procedure Notes Scribed for Dr. Lady Gary by Brenton Grills RN. Electronic Signature(s) Signed: 06/07/2022 12:21:18 PM By: Duanne Guess MD FACS Signed: 06/16/2022 1:56:54 PM By: Brenton Grills Entered By: Brenton Grills on 06/07/2022 09:59:16 -------------------------------------------------------------------------------- Debridement Details Patient Name: Date of  Service: [name], [name] 06/07/2022 9:00 A M Medical Record Number: [id number] Patient Account Number: [id number] Date of Birth/Sex: Treating RN: November 04, 1935 (87 y.o. [name] Primary Care Provider: Merri Brunette Other Clinician: Referring Provider: Treating Provider/Extender: Mortimer Fries Weeks in Treatment: 2 Debridement Performed for Assessment: Wound #5 Right Calcaneus Performed By: Physician Duanne Guess, MD Debridement Type: Debridement Level of Consciousness (Pre-procedure): Awake and Alert Pre-procedure Verification/Time Out No Taken: T Area Debrided (L x W): otal 0.5 (cm) x 0.3 (cm) = 0.15 (cm) Tissue and other material debrided: Non-Viable, Eschar, Slough, Slough Level: Non-Viable Tissue Debridement Description: Selective/Open Wound Instrument: Curette Bleeding: Minimum Hemostasis Achieved: Pressure Response to Treatment: Procedure was tolerated well Level of Consciousness (Post- Awake and Alert procedure): Post Debridement Measurements of Total Wound Length: (cm) 0.5 Width: (cm) 0.3 Depth: (cm) 0.2 Volume: (cm) 0.024 Character of Wound/Ulcer Post Debridement: Improved Post Procedure Diagnosis Same as Pre-procedure Notes Scribed for Dr. Lady Gary by Brenton Grills RN. Electronic Signature(s) Signed: 06/07/2022 12:21:18 PM By: Duanne Guess MD FACS Signed: 06/16/2022 1:56:54 PM By: Brenton Grills Entered By: Brenton Grills on 06/07/2022 10:01:20 -------------------------------------------------------------------------------- HPI Details Patient Name: Date of Service: [name], [name] 06/07/2022 9:00 A M Medical Record Number: [id number] Patient Account Number: [id number] Date of Birth/Sex: Treating RN: [birth year] (87 y.o. M) Primary Care Provider: Merri Brunette Other Clinician: Referring Provider: Treating Provider/Extender: Mortimer Fries Weeks in Treatment: 2 EMORY, GALLENTINE [name] ([id number])  [id number] Page 4 of 13 History of Present Illness HPI Description: ADMISSION This is an 87 year old man with a past medical history significant for congestive heart failure, CKD stage III, Parkinson's disease, venous insufficiency, pulmonary hypertension, chronic A-fib/a flutter on Eliquis, coronary artery disease, and lymphedema. He has been followed in the vascular surgery clinic by Dr. Edilia Bo. Per Dr. Adele Dan last note, lymphedema pumps have been ordered, but he has not yet received them. He referred the patient to the wound care clinic for closer management of his lymphedema and venous ulcers. Venous reflux studies have been performed and unfortunately, the reflux is deep and therefore not amenable to laser ablation. ABIs in clinic today were noncompressible, but he had excellent Doppler signals. He has a small wound on his left anterior tibial surface with a bit of eschar present. He has another small wound on his right anterior tibial surface that is quite clean. He has a larger superficial ulcer on his right posterior calf that has a small amount of slough present. 04/07/2022: The only remaining open wounds are a small superficial lesion on his right posterior calf and a small ulcer on his left anterior tibial surface. They are both clean without any slough or eschar accumulation. Edema control is good. 04/15/2022:  His wounds are healed. READMISSION 05/24/2022 He returns today with multiple open wounds, most of which are from lack of understanding and compliance with the need to wear his juxta lite stockings throughout the day in addition to using his lymphedema pumps. He has been just using his lymphedema pumps without consistent compression garment use. He also struck his left lateral leg on a car door and has a wound related to this. He has multiple small wounds on his lateral right leg and bilateral posterior calves. He also has an ulcer on his plantar right  calcaneus. There is slough and eschar on all surfaces. His edema is completely uncontrolled. 05/30/2022: He has 2 ulcers on the back of his right leg that are open and the large left lateral lower leg wound as well as the right heel ulcer. The other wounds have closed. There is slough accumulation on all surfaces, along with a little bit of eschar on the leg wounds. There was some misunderstanding between the patient's wife and myself; she applied his juxta lite stockings over his 4-layer compression and had him using his lymphedema pumps twice a day. This resulted in a significant decrease in his swelling, but was probably more compression than indicated. I clarified that the juxta lite stockings were to be applied after his wounds had healed and he was out of our compression wraps. He should, however, continue to use his lymphedema pumps over our wraps now and his juxta lite stockings once he is healed. 06/07/2022: Under a layer of eschar and dry skin, all of the posterior leg wounds are closed. The left lateral lower leg wound has a layer of eschar and dried silver alginate present. Underneath this, the wound has closed up considerably. There is some slough on the wound surface. He has a new abrasion to his right anterior tibial surface which they think was caused by his lymphedema pumps. The wound is limited to skin breakdown only. Electronic Signature(s) Signed: 06/07/2022 10:18:16 AM By: Duanne Guess MD FACS Entered By: Duanne Guess on 06/07/2022 10:18:16 -------------------------------------------------------------------------------- Physical Exam Details Patient Name: Date of Service: [name], [name] [name]. 06/07/2022 9:00 A M Medical Record Number: [id number] Patient Account Number: [id number] Date of Birth/Sex: Treating RN: Jun 13, 1935 (87 y.o. M) Primary Care Provider: Merri Brunette Other Clinician: Referring Provider: Treating Provider/Extender: Otho Ket, Candace Weeks  in Treatment: 2 Constitutional Slightly hypertensive. . . . no acute distress. Respiratory Normal work of breathing on room air. Notes 06/07/2022: Under a layer of eschar and dry skin, all of the posterior leg wounds are closed. The left lateral lower leg wound has a layer of eschar and dried silver alginate present. Underneath this, the wound has closed up considerably. There is some slough on the wound surface. He has a new abrasion to his right anterior tibial surface which they think was caused by his lymphedema pumps. The wound is limited to skin breakdown only. Electronic Signature(s) Signed: 06/07/2022 10:19:40 AM By: Duanne Guess MD FACS Entered By: Duanne Guess on 06/07/2022 10:19:40 -------------------------------------------------------------------------------- Physician Orders Details Patient Name: Date of Service: [name], Pascal [name]. 06/07/2022 9:00 A M Medical Record Number: [id number] Patient Account Number: [id number] Date of Birth/Sex: Treating RN: [birth year] (87 y.o. [name] Primary Care Provider: Merri Brunette Other Clinician: Referring Provider: Treating Provider/Extender: Mortimer Fries Weeks in Treatment: 2 Verbal / Phone Orders: No [name], [name] ([id number]) [id number] Page 5 of 13 Diagnosis Coding ICD-10 Coding Code Description [phone] Non-pressure chronic ulcer of other part  of left lower leg with fat layer exposed L97.412 Non-pressure chronic ulcer of right heel and midfoot with fat layer exposed L97.811 Non-pressure chronic ulcer of other part of right lower leg limited to breakdown of skin I89.0 Lymphedema, not elsewhere classified I87.2 Venous insufficiency (chronic) (peripheral) G20.C Parkinsonism, unspecified N18.30 Chronic kidney disease, stage 3 unspecified I50.32 Chronic diastolic (congestive) heart failure Follow-up Appointments ppointment in 1 week. - Dr. Lady Gary Room 1 06/14/2022  Tuesday 245pm Return A Anesthetic (In clinic) Topical Lidocaine 4% applied to wound bed Bathing/ Shower/ Hygiene May shower with protection but do not get wound dressing(s) wet. Protect dressing(s) with water repellant cover (for example, large plastic bag) or a cast cover and may then take shower. - may purchase cast protector at [facility], medical supply store Edema Control - Lymphedema / SCD / Other Bilateral Lower Extremities Lymphedema Pumps. Use Lymphedema pumps on leg(s) 2-3 times a day for 45-60 minutes. If wearing any wraps or hose, do not remove them. Continue exercising as instructed. Elevate legs to the level of the heart or above for 30 minutes daily and/or when sitting for 3-4 times a day throughout the day. - whenever sitting Avoid standing for long periods of time. Exercise regularly Wound Treatment Wound #10 - Lower Leg Wound Laterality: Right, Anterior Cleanser: Soap and Water 1 x Per Week/30 Days Discharge Instructions: May shower and wash wound with dial antibacterial soap and water prior to dressing change. Cleanser: Wound Cleanser 1 x Per Week/30 Days Discharge Instructions: Cleanse the wound with wound cleanser prior to applying a clean dressing using gauze sponges, not tissue or cotton balls. Peri-Wound Care: Sween Lotion (Moisturizing lotion) 1 x Per Week/30 Days Discharge Instructions: Apply moisturizing lotion as directed Prim Dressing: Maxorb Extra Ag+ Alginate Dressing, 2x2 (in/in) 1 x Per Week/30 Days ary Discharge Instructions: Apply to wound bed as instructed Secondary Dressing: ABD Pad, 5x9 1 x Per Week/30 Days Discharge Instructions: Apply over primary dressing as directed. Secondary Dressing: Woven Gauze Sponge, Non-Sterile 4x4 in 1 x Per Week/30 Days Discharge Instructions: Apply over primary dressing as directed. Compression Wrap: Urgo K2, two layer compression system, regular 1 x Per Week/30 Days Discharge Instructions: Apply Urgo K2 as  directed (alternative to 4 layer compression). Wound #5 - Calcaneus Wound Laterality: Right Cleanser: Soap and Water 1 x Per Week/30 Days Discharge Instructions: May shower and wash wound with dial antibacterial soap and water prior to dressing change. Cleanser: Wound Cleanser 1 x Per Week/30 Days Discharge Instructions: Cleanse the wound with wound cleanser prior to applying a clean dressing using gauze sponges, not tissue or cotton balls. Prim Dressing: Maxorb Extra Ag+ Alginate Dressing, 2x2 (in/in) 1 x Per Week/30 Days ary Discharge Instructions: Apply to wound bed as instructed Secondary Dressing: ALLEVYN Heel 4 1/2in x 5 1/2in / 10.5cm x 13.5cm 1 x Per Week/30 Days Discharge Instructions: Apply over primary dressing as directed. Secondary Dressing: Optifoam Non-Adhesive Dressing, 4x4 in 1 x Per Week/30 Days Discharge Instructions: Apply over primary dressing as directed. Secondary Dressing: Woven Gauze Sponge, Non-Sterile 4x4 in 1 x Per Week/30 Days Discharge Instructions: Apply over primary dressing as directed. Compression Wrap: Urgo K2, two layer compression system, regular 1 x Per Week/30 Days [name], [name] ([id number]) [phone].pdf Page 6 of 13 Discharge Instructions: Apply Urgo K2 as directed (alternative to 4 layer compression). Wound #8 - Lower Leg Wound Laterality: Left, Lateral Cleanser: Soap and Water 1 x Per Week/30 Days Discharge Instructions: May shower and wash wound with dial  antibacterial soap and water prior to dressing change. Cleanser: Wound Cleanser 1 x Per Week/30 Days Discharge Instructions: Cleanse the wound with wound cleanser prior to applying a clean dressing using gauze sponges, not tissue or cotton balls. Peri-Wound Care: Sween Lotion (Moisturizing lotion) 1 x Per Week/30 Days Discharge Instructions: Apply moisturizing lotion as directed Prim Dressing: Maxorb Extra Ag+ Alginate Dressing, 2x2 (in/in) 1 x Per Week/30  Days ary Discharge Instructions: Apply to wound bed as instructed Secondary Dressing: ABD Pad, 5x9 1 x Per Week/30 Days Discharge Instructions: Apply over primary dressing as directed. Secondary Dressing: Woven Gauze Sponge, Non-Sterile 4x4 in 1 x Per Week/30 Days Discharge Instructions: Apply over primary dressing as directed. Compression Wrap: Urgo K2, two layer compression system, regular 1 x Per Week/30 Days Discharge Instructions: Apply Urgo K2 as directed (alternative to 4 layer compression). Electronic Signature(s) Signed: 06/07/2022 12:21:18 PM By: Duanne Guess MD FACS Entered By: Duanne Guess on 06/07/2022 10:19:59 -------------------------------------------------------------------------------- Problem List Details Patient Name: Date of Service: [name], Nakai [name]. 06/07/2022 9:00 A M Medical Record Number: [id number] Patient Account Number: [id number] Date of Birth/Sex: Treating RN: April 12, 1935 (87 y.o. [name] Primary Care Provider: Merri Brunette Other Clinician: Referring Provider: Treating Provider/Extender: Mortimer Fries Weeks in Treatment: 2 Active Problems ICD-10 Encounter Code Description Active Date MDM Diagnosis [phone] Non-pressure chronic ulcer of other part of left lower leg with fat layer exposed3/26/2024 No Yes L97.412 Non-pressure chronic ulcer of right heel and midfoot with fat layer exposed 05/24/2022 No Yes L97.811 Non-pressure chronic ulcer of other part of right lower leg limited to breakdown 06/07/2022 No Yes of skin I89.0 Lymphedema, not elsewhere classified 05/24/2022 No Yes I87.2 Venous insufficiency (chronic) (peripheral) 05/24/2022 No Yes G20.C Parkinsonism, unspecified 05/24/2022 No Yes N18.30 Chronic kidney disease, stage 3 unspecified 05/24/2022 No Yes THAILAN, SAVA [name] ([id number]) [phone].pdf Page 7 of 13 I50.32 Chronic diastolic (congestive) heart failure 05/24/2022 No Yes Inactive  Problems Resolved Problems ICD-10 Code Description Active Date Resolved Date L97.812 Non-pressure chronic ulcer of other part of right lower leg with fat layer exposed 05/24/2022 05/24/2022 G64.403 Non-pressure chronic ulcer of left calf with fat layer exposed 05/24/2022 05/24/2022 K74.259 Non-pressure chronic ulcer of right calf with fat layer exposed 05/24/2022 05/24/2022 Electronic Signature(s) Signed: 06/07/2022 10:16:43 AM By: Duanne Guess MD FACS Entered By: Duanne Guess on 06/07/2022 10:16:43 -------------------------------------------------------------------------------- Progress Note Details Patient Name: Date of Service: [name], Lovie [name]. 06/07/2022 9:00 A M Medical Record Number: [id number] Patient Account Number: [id number] Date of Birth/Sex: Treating RN: [birth year] (87 y.o. M) Primary Care Provider: Merri Brunette Other Clinician: Referring Provider: Treating Provider/Extender: Mortimer Fries Weeks in Treatment: 2 Subjective Chief Complaint Information obtained from Patient Patient presents for treatment of open ulcers due to venous insufficiency and lymphedema History of Present Illness (HPI) ADMISSION This is an 87 year old man with a past medical history significant for congestive heart failure, CKD stage III, Parkinson's disease, venous insufficiency, pulmonary hypertension, chronic A-fib/a flutter on Eliquis, coronary artery disease, and lymphedema. He has been followed in the vascular surgery clinic by Dr. Edilia Bo. Per Dr. Adele Dan last note, lymphedema pumps have been ordered, but he has not yet received them. He referred the patient to the wound care clinic for closer management of his lymphedema and venous ulcers. Venous reflux studies have been performed and unfortunately, the reflux is deep and therefore not amenable to laser ablation. ABIs in clinic today were noncompressible, but he had excellent Doppler signals. He has a small wound on  his  left anterior tibial surface with a bit of eschar present. He has another small wound on his right anterior tibial surface that is quite clean. He has a larger superficial ulcer on his right posterior calf that has a small amount of slough present. 04/07/2022: The only remaining open wounds are a small superficial lesion on his right posterior calf and a small ulcer on his left anterior tibial surface. They are both clean without any slough or eschar accumulation. Edema control is good. 04/15/2022: His wounds are healed. READMISSION 05/24/2022 He returns today with multiple open wounds, most of which are from lack of understanding and compliance with the need to wear his juxta lite stockings throughout the day in addition to using his lymphedema pumps. He has been just using his lymphedema pumps without consistent compression garment use. He also struck his left lateral leg on a car door and has a wound related to this. He has multiple small wounds on his lateral right leg and bilateral posterior calves. He also has an ulcer on his plantar right calcaneus. There is slough and eschar on all surfaces. His edema is completely uncontrolled. 05/30/2022: He has 2 ulcers on the back of his right leg that are open and the large left lateral lower leg wound as well as the right heel ulcer. The other wounds have closed. There is slough accumulation on all surfaces, along with a little bit of eschar on the leg wounds. There was some misunderstanding between the patient's wife and myself; she applied his juxta lite stockings over his 4-layer compression and had him using his lymphedema pumps twice a day. This resulted in a significant decrease in his swelling, but was probably more compression than indicated. I clarified that the juxta lite stockings were to be applied after his wounds had healed and he was out of our compression wraps. He should, however, continue to use his lymphedema pumps over our wraps now  and his juxta lite stockings once he is healed. 06/07/2022: Under a layer of eschar and dry skin, all of the posterior leg wounds are closed. The left lateral lower leg wound has a layer of eschar and dried silver alginate present. Underneath this, the wound has closed up considerably. There is some slough on the wound surface. He has a new abrasion to his right anterior tibial surface which they think was caused by his lymphedema pumps. The wound is limited to skin breakdown only. Patient History [name], [name] ([id number]) [id number] Page 8 of 13 Information obtained from Patient, Caregiver, Chart. Family History Heart Disease - Father, Stroke, No family history of Cancer, Diabetes, Hereditary Spherocytosis, Hypertension, Kidney Disease, Lung Disease, Seizures, Thyroid Problems, Tuberculosis. Social History Never smoker, Marital Status - Married, Alcohol Use - Daily - 2 cocktails per day, Drug Use - No History, Caffeine Use - Daily. Medical History Eyes Patient has history of Cataracts - bil extractions Denies history of Glaucoma, Optic Neuritis Hematologic/Lymphatic Patient has history of Lymphedema - lower legs Cardiovascular Patient has history of Arrhythmia - afib, Congestive Heart Failure, Coronary Artery Disease, Hypertension Endocrine Denies history of Type I Diabetes, Type II Diabetes Genitourinary Denies history of End Stage Renal Disease Integumentary (Skin) Denies history of History of Burn Musculoskeletal Patient has history of Osteoarthritis - both knees Denies history of Gout, Rheumatoid Arthritis, Osteomyelitis Oncologic Patient has history of Received Radiation - seed implants Denies history of Received Chemotherapy Psychiatric Patient has history of Confinement Anxiety Denies history of Anorexia/bulimia Hospitalization/Surgery History - left  femur IM nail. - right shoulder melanoma excision. - cardioversion. - hemorrhoid surgery. -  cardiac anfioplasty. - coronary atherectomy. - bil cataract extraction. - insertion prostate radiation seed. - lap cholecystectomy. - inguinal hernia repair. Medical A Surgical History Notes nd Respiratory elevated hemidiaphragm Cardiovascular pulmonary hypertension, mitral valve regurgitation, dilated cardiomyopathy Genitourinary CKD stage 3 Neurologic parkinson's Oncologic skin CA removed, prostate CA Objective Constitutional Slightly hypertensive. no acute distress. Vitals Time Taken: 9:05 AM, Height: 71 in, Weight: 190 lbs, BMI: 26.5, Temperature: 97.9 F, Pulse: 72 bpm, Respiratory Rate: 18 breaths/min, Blood Pressure: 148/77 mmHg. Respiratory Normal work of breathing on room air. General Notes: 06/07/2022: Under a layer of eschar and dry skin, all of the posterior leg wounds are closed. The left lateral lower leg wound has a layer of eschar and dried silver alginate present. Underneath this, the wound has closed up considerably. There is some slough on the wound surface. He has a new abrasion to his right anterior tibial surface which they think was caused by his lymphedema pumps. The wound is limited to skin breakdown only. Integumentary (Hair, Skin) Wound #10 status is Open. Original cause of wound was Not Known. The date acquired was: 06/04/2022. The wound is located on the Right,Anterior Lower Leg. The wound measures 3.2cm length x 1.2cm width x 0.1cm depth; 3.016cm^2 area and 0.302cm^3 volume. There is Fat Layer (Subcutaneous Tissue) exposed. There is no tunneling or undermining noted. There is a medium amount of serosanguineous drainage noted. The wound margin is flat and intact. There is no granulation within the wound bed. There is a medium (34-66%) amount of necrotic tissue within the wound bed including Eschar and Adherent Slough. The periwound skin appearance had no abnormalities noted for texture. The periwound skin appearance exhibited: Dry/Scaly, Hemosiderin Staining.  The periwound skin appearance did not exhibit: Maceration. The periwound has tenderness on palpation. Wound #5 status is Open. Original cause of wound was Shear/Friction. The date acquired was: 05/24/2022. The wound has been in treatment 2 weeks. The wound is located on the Right Calcaneus. The wound measures 0.5cm length x 0.3cm width x 0.2cm depth; 0.118cm^2 area and 0.024cm^3 volume. There is Fat Layer (Subcutaneous Tissue) exposed. There is a medium amount of serosanguineous drainage noted. The wound margin is distinct with the outline attached to the wound base. There is large (67-100%) red granulation within the wound bed. There is a small (1-33%) amount of necrotic tissue within the wound bed including Adherent Slough. The periwound skin appearance had no abnormalities noted for moisture. The periwound skin appearance had no abnormalities noted for color. The periwound skin appearance exhibited: Callus. Periwound temperature was noted as No Abnormality. The periwound has tenderness on palpation. [name], [name] ([id number]) [id number] Page 9 of 13 Wound #6 status is Healed - Epithelialized. Original cause of wound was Gradually Appeared. The date acquired was: 05/10/2022. The wound has been in treatment 2 weeks. The wound is located on the Right,Posterior Lower Leg. The wound measures 0cm length x 0cm width x 0cm depth; 0cm^2 area and 0cm^3 volume. There is Fat Layer (Subcutaneous Tissue) exposed. There is a medium amount of serosanguineous drainage noted. The wound margin is distinct with the outline attached to the wound base. There is large (67-100%) red, pink granulation within the wound bed. There is no necrotic tissue within the wound bed. The periwound skin appearance had no abnormalities noted for texture. The periwound skin appearance exhibited: Hemosiderin Staining. The periwound skin appearance did not exhibit: Dry/Scaly, Maceration, Atrophie Blanche,  Cyanosis, Ecchymosis, Mottled, Pallor, Rubor, Erythema. Periwound temperature was noted as No Abnormality. Wound #8 status is Open. Original cause of wound was Trauma. The date acquired was: 05/09/2022. The wound has been in treatment 2 weeks. The wound is located on the Left,Lateral Lower Leg. The wound measures 2.4cm length x 2cm width x 0.1cm depth; 3.77cm^2 area and 0.377cm^3 volume. There is Fat Layer (Subcutaneous Tissue) exposed. There is a medium amount of serosanguineous drainage noted. The wound margin is distinct with the outline attached to the wound base. There is medium (34-66%) red granulation within the wound bed. There is a medium (34-66%) amount of necrotic tissue within the wound bed. The periwound skin appearance had no abnormalities noted for texture. The periwound skin appearance had no abnormalities noted for moisture. The periwound skin appearance did not exhibit: Atrophie Blanche, Cyanosis, Ecchymosis, Hemosiderin Staining, Mottled, Pallor, Rubor, Erythema. Periwound temperature was noted as No Abnormality. 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 There was a Selective/Open Wound Non-Viable Tissue Debridement with a total area of 3.84 sq cm performed by Duanne Guess, MD. With the following instrument(s): Curette to remove Non-Viable tissue/material. Material removed includes Eschar and Slough and. No specimens were taken.A  Minimum amount of bleeding was controlled with Pressure. The procedure was tolerated well. Post Debridement Measurements: 3.2cm length x 1.2cm width x 0.1cm depth; 0.302cm^3 volume. Character of Wound/Ulcer Post Debridement is stable. Post procedure Diagnosis Wound #10: Same as Pre-Procedure General Notes: Scribed for Dr. Lady Gary by Brenton Grills RN.. Pre-procedure diagnosis of Wound #10 is a Lymphedema located on the Right,Anterior Lower Leg . There was a Double Layer Compression Therapy Procedure by Brenton Grills, RN. Post procedure Diagnosis Wound #10: Same as Pre-Procedure Wound #5 Pre-procedure diagnosis of Wound #5 is a Lymphedema located on the Right Calcaneus . There was a Selective/Open Wound Non-Viable Tissue Debridement with a total area of 0.15 sq cm performed by Duanne Guess, MD. With the following instrument(s): Curette to remove Non-Viable tissue/material. Material removed includes Eschar and Slough and. No specimens were taken.A Minimum amount of bleeding was controlled with Pressure. The procedure was tolerated well. Post Debridement Measurements: 0.5cm length x 0.3cm width x 0.2cm depth; 0.024cm^3 volume. Character of Wound/Ulcer Post Debridement is improved. Post procedure Diagnosis Wound #5: Same as Pre-Procedure General Notes: Scribed for Dr. Lady Gary by Brenton Grills RN.[name] Wound #6 Pre-procedure diagnosis of Wound #6 is a Lymphedema located on the Right,Posterior Lower Leg . There was a Selective/Open Wound Non-Viable Tissue Debridement with a total area of 0.01 sq cm performed by Duanne Guess, MD. With the following instrument(s): Curette to remove Non-Viable tissue/material. Material removed includes Eschar and Slough and. No specimens were taken.A Minimum amount of bleeding was controlled with Pressure. The procedure was tolerated well. Post Debridement Measurements: 0.1cm length x 0.1cm width x 0.1cm depth; 0.001cm^3 volume. Character of Wound/Ulcer Post  Debridement is improved. Post procedure Diagnosis Wound #6: Same as Pre-Procedure General Notes: Scribed for Dr. Lady Gary by Brenton Grills RN.[name] Wound #8 Pre-procedure diagnosis of Wound #8 is a Lymphedema located on  the Left,Lateral Lower Leg . There was a Selective/Open Wound Non-Viable Tissue Debridement with a total area of 4.8 sq cm performed by Duanne Guess, MD. With the following instrument(s): Curette to remove Non-Viable tissue/material. Material removed includes Eschar and Slough and. No specimens were taken.A Minimum amount of bleeding was controlled with Pressure. The procedure was tolerated well. Post Debridement Measurements: 2.4cm length x 2cm width x 0.1cm depth; 0.377cm^3 volume. Character of Wound/Ulcer Post Debridement is improved. Post procedure Diagnosis Wound #8: Same as Pre-Procedure General Notes: Scribed for Dr. Lady Gary by Brenton Grills RN.. Pre-procedure diagnosis of Wound #8 is a Lymphedema located on the Left,Lateral Lower Leg . There was a Double Layer Compression Therapy Procedure by Brenton Grills, RN. Post procedure Diagnosis Wound #8: Same as Pre-Procedure [name], [name] ([id number]) [phone].pdf Page 10 of 13 Plan Follow-up Appointments: Return Appointment in 1 week. - Dr. Lady Gary Room 1 06/14/2022 Tuesday 245pm Anesthetic: (In clinic) Topical Lidocaine 4% applied to wound bed Bathing/ Shower/ Hygiene: May shower with protection but do not get wound dressing(s) wet. Protect dressing(s) with water repellant cover (for example, large plastic bag) or a cast cover and may then take shower. - may purchase cast protector at [facility], medical supply store Edema Control - Lymphedema / SCD / Other: Lymphedema Pumps. Use Lymphedema pumps on leg(s) 2-3 times a day for 45-60 minutes. If wearing any wraps or hose, do not remove them. Continue exercising as instructed. Elevate legs to the level of the heart or above for 30 minutes daily  and/or when sitting for 3-4 times a day throughout the day. - whenever sitting Avoid standing for long periods of time. Exercise regularly WOUND #10: - Lower Leg Wound Laterality: Right, Anterior Cleanser: Soap and Water 1 x Per Week/30 Days Discharge Instructions: May shower and wash wound with dial antibacterial soap and water prior to dressing change. Cleanser: Wound Cleanser 1 x Per Week/30 Days Discharge Instructions: Cleanse the wound with wound cleanser prior to applying a clean dressing using gauze sponges, not tissue or cotton balls. Peri-Wound Care: Sween Lotion (Moisturizing lotion) 1 x Per Week/30 Days Discharge Instructions: Apply moisturizing lotion as directed Prim Dressing: Maxorb Extra Ag+ Alginate Dressing, 2x2 (in/in) 1 x Per Week/30 Days ary Discharge Instructions: Apply to wound bed as instructed Secondary Dressing: ABD Pad, 5x9 1 x Per Week/30 Days Discharge Instructions: Apply over primary dressing as directed. Secondary Dressing: Woven Gauze Sponge, Non-Sterile 4x4 in 1 x Per Week/30 Days Discharge Instructions: Apply over primary dressing as directed. Com pression Wrap: Urgo K2, two layer compression system, regular 1 x Per Week/30 Days Discharge Instructions: Apply Urgo K2 as directed (alternative to 4 layer compression). WOUND #5: - Calcaneus Wound Laterality: Right Cleanser: Soap and Water 1 x Per Week/30 Days Discharge Instructions: May shower and wash wound with dial antibacterial soap and water prior to dressing change. Cleanser: Wound Cleanser 1 x Per Week/30 Days Discharge Instructions: Cleanse the wound with wound cleanser prior to applying a clean dressing using gauze sponges, not tissue or cotton balls. Prim Dressing: Maxorb Extra Ag+ Alginate Dressing, 2x2 (in/in) 1 x Per Week/30 Days ary Discharge Instructions: Apply to wound bed as instructed Secondary Dressing: ALLEVYN Heel 4 1/2in x 5 1/2in / 10.5cm x 13.5cm 1 x Per Week/30 Days Discharge  Instructions: Apply over primary dressing as directed. Secondary Dressing: Optifoam Non-Adhesive Dressing, 4x4 in 1 x Per Week/30 Days Discharge Instructions: Apply over primary dressing as directed. Secondary Dressing: Woven Gauze Sponge, Non-Sterile 4x4 in 1  x Per Week/30 Days Discharge Instructions: Apply over primary dressing as directed. Com pression Wrap: Urgo K2, two layer compression system, regular 1 x Per Week/30 Days Discharge Instructions: Apply Urgo K2 as directed (alternative to 4 layer compression). WOUND #8: - Lower Leg Wound Laterality: Left, Lateral Cleanser: Soap and Water 1 x Per Week/30 Days Discharge Instructions: May shower and wash wound with dial antibacterial soap and water prior to dressing change. Cleanser: Wound Cleanser 1 x Per Week/30 Days Discharge Instructions: Cleanse the wound with wound cleanser prior to applying a clean dressing using gauze sponges, not tissue or cotton balls. Peri-Wound Care: Sween Lotion (Moisturizing lotion) 1 x Per Week/30 Days Discharge Instructions: Apply moisturizing lotion as directed Prim Dressing: Maxorb Extra Ag+ Alginate Dressing, 2x2 (in/in) 1 x Per Week/30 Days ary Discharge Instructions: Apply to wound bed as instructed Secondary Dressing: ABD Pad, 5x9 1 x Per Week/30 Days Discharge Instructions: Apply over primary dressing as directed. Secondary Dressing: Woven Gauze Sponge, Non-Sterile 4x4 in 1 x Per Week/30 Days Discharge Instructions: Apply over primary dressing as directed. Com pression Wrap: Urgo K2, two layer compression system, regular 1 x Per Week/30 Days Discharge Instructions: Apply Urgo K2 as directed (alternative to 4 layer compression). 06/07/2022: Under a layer of eschar and dry skin, all of the posterior leg wounds are closed. The left lateral lower leg wound has a layer of eschar and dried silver alginate present. Underneath this, the wound has closed up considerably. There is some slough on the wound surface.  He has a new abrasion to his right anterior tibial surface which they think was caused by his lymphedema pumps. The wound is limited to skin breakdown only. I used a curette to debride eschar from the new anterior tibial wound on the right, slough and eschar from the heel ulcer and slough, eschar, and stuck on silver alginate from the left lower leg wound. We will continue silver alginate and 4-layer compression equivalent to all wound sites. They are going to contact the lymphedema DME company rep regarding the issue with the abrasion to see if he needs to be remeasured and get a better fitting sleeve. Follow-up in 1 week. Electronic Signature(s) Signed: 06/07/2022 10:21:32 AM By: Duanne Guess MD FACS Entered By: Duanne Guess on 06/07/2022 10:21:32 -------------------------------------------------------------------------------- HxROS Details Patient Name: Date of Service: [name], Cecil [name]. 06/07/2022 9:00 A M Medical Record Number: [id number] Patient Account Number: [id number] Date of Birth/Sex: Treating RN: 14-Jul-1935 (87 y.o. Patrici Ranks, Azaryah [name] ([id number]) [id number] Page 11 of 13 Primary Care Provider: Merri Brunette Other Clinician: Referring Provider: Treating Provider/Extender: Mortimer Fries Weeks in Treatment: 2 Information Obtained From Patient Caregiver Chart Eyes Medical History: Positive for: Cataracts - bil extractions Negative for: Glaucoma; Optic Neuritis Hematologic/Lymphatic Medical History: Positive for: Lymphedema - lower legs Respiratory Medical History: Past Medical History Notes: elevated hemidiaphragm Cardiovascular Medical History: Positive for: Arrhythmia - afib; Congestive Heart Failure; Coronary Artery Disease; Hypertension Past Medical History Notes: pulmonary hypertension, mitral valve regurgitation, dilated cardiomyopathy Endocrine Medical History: Negative for: Type I Diabetes; Type II  Diabetes Genitourinary Medical History: Negative for: End Stage Renal Disease Past Medical History Notes: CKD stage 3 Integumentary (Skin) Medical History: Negative for: History of Burn Musculoskeletal Medical History: Positive for: Osteoarthritis - both knees Negative for: Gout; Rheumatoid Arthritis; Osteomyelitis Neurologic Medical History: Past Medical History Notes: parkinson's Oncologic Medical History: Positive for: Received Radiation - seed implants Negative for: Received Chemotherapy Past Medical History Notes: skin CA removed, prostate CA Psychiatric  Medical History: Positive for: Confinement Anxiety Negative for: Anorexia/bulimia HBO Extended History Items Eyes: Cataracts Romaine, Neville Blong [name] ([id number]) [phone].pdf Page 12 of 13 Immunizations Pneumococcal Vaccine: Received Pneumococcal Vaccination: Yes Received Pneumococcal Vaccination On or After 60th Birthday: Yes Implantable Devices No devices added Hospitalization / Surgery History Type of Hospitalization/Surgery left femur IM nail right shoulder melanoma excision cardioversion hemorrhoid surgery cardiac anfioplasty coronary atherectomy bil cataract extraction insertion prostate radiation seed lap cholecystectomy inguinal hernia repair Family and Social History Cancer: No; Diabetes: No; Heart Disease: Yes - Father; Hereditary Spherocytosis: No; Hypertension: No; Kidney Disease: No; Lung Disease: No; Seizures: No; Stroke: Yes; Thyroid Problems: No; Tuberculosis: No; Never smoker; Marital Status - Married; Alcohol Use: Daily - 2 cocktails per day; Drug Use: No History; Caffeine Use: Daily; Financial Concerns: No; Food, Clothing or Shelter Needs: No; Support System Lacking: No; Transportation Concerns: No Electronic Signature(s) Signed: 06/07/2022 12:21:18 PM By: Duanne Guess MD FACS Entered By: Duanne Guess on 06/07/2022  10:19:04 -------------------------------------------------------------------------------- SuperBill Details Patient Name: Date of Service: [name], Rivaldo [name]. 06/07/2022 Medical Record Number: [id number] Patient Account Number: [id number] Date of Birth/Sex: Treating RN: May 10, 1935 (87 y.o. M) Primary Care Provider: Merri Brunette Other Clinician: Referring Provider: Treating Provider/Extender: Mortimer Fries Weeks in Treatment: 2 Diagnosis Coding ICD-10 Codes Code Description [id number] Non-pressure chronic ulcer of other part of left lower leg with fat layer exposed L97.412 Non-pressure chronic ulcer of right heel and midfoot with fat layer exposed L97.811 Non-pressure chronic ulcer of other part of right lower leg limited to breakdown of skin I89.0 Lymphedema, not elsewhere classified I87.2 Venous insufficiency (chronic) (peripheral) G20.C Parkinsonism, unspecified N18.30 Chronic kidney disease, stage 3 unspecified I50.32 Chronic diastolic (congestive) heart failure Facility Procedures : CPT4 Code: [id number] Description: [id number] - DEBRIDE WOUND 1ST 20 SQ CM OR < ICD-10 Diagnosis Description L97.822 Non-pressure chronic ulcer of other part of left lower leg with fat layer exposed L97.412 Non-pressure chronic ulcer of right heel and midfoot with fat layer  exposed L97.811 Non-pressure chronic ulcer of other part of right lower leg limited to breakdown of Modifier: skin Quantity: 1 Physician Procedures : CPT4 Code Description Modifier [id number] - WC PHYS LEVEL 3 - EST PT 25 ICD-10 Diagnosis Description L97.822 Non-pressure chronic ulcer of other part of left lower leg with fat layer exposed L97.412 Non-pressure chronic ulcer of right heel and  midfoot with fat layer exposed Poinsett, Ithiel [name] ([id number]) [phone] L97.811 Non-pressure chronic ulcer of other part of right lower leg limited to breakdown of skin I89.0 Lymphedema, not elsewhere  classified Quantity: 1 7.pdf Page 13 of 13 : [id number] - WC PHYS DEBR WO ANESTH 20 SQ CM 1 ICD-10 Diagnosis Description L97.822 Non-pressure chronic ulcer of other part of left lower leg with fat layer exposed L97.412 Non-pressure chronic ulcer of right heel and midfoot with fat layer exposed  L97.811 Non-pressure chronic ulcer of other part of right lower leg limited to breakdown of skin Quantity: Electronic Signature(s) Signed: 06/07/2022 10:22:41 AM By: Duanne Guess MD FACS Entered By: Duanne Guess on 06/07/2022 10:22:40

## 2022-06-16 NOTE — Progress Notes (Signed)
THORN, DEMAS [name] ([id number]) [id number] Page 1 of 11 Visit Report for 06/07/2022 Arrival Information Details Patient Name: Date of Service: [name], [name] 06/07/2022 9:00 A M Medical Record Number: [id number] Patient Account Number: [id number] Date of Birth/Sex: Treating RN: 1935/05/26 (87 y.o. [name] Primary Care [name]: [name] Other Clinician: Referring [name]: Treating [name]/Extender: [name] Weeks in [name]: 2 Visit Information History Since Last Visit All ordered tests and consults were completed: Yes Patient Arrived: Wheel Chair Added or deleted any medications: No Arrival Time: 09:06 Any new allergies or adverse reactions: No Accompanied By: wife Had a fall or experienced change in No Transfer Assistance: EasyPivot Patient Lift activities of daily living that may affect Patient Requires Transmission-Based Precautions: No risk of falls: Patient Has Alerts: Yes Signs or symptoms of abuse/neglect since last visito No Patient Alerts: ABI: BLE [place] Hospitalized since last visit: No Implantable device outside of the clinic excluding No cellular tissue based products placed in the center since last visit: Has Dressing in Place as Prescribed: Yes Pain Present Now: Yes Electronic Signature(s) Signed: 06/16/2022 1:56:54 PM By: [name] Entered By: [name] on 06/07/2022 09:07:18 -------------------------------------------------------------------------------- Compression Therapy Details Patient Name: Date of Service: [name], [name] 06/07/2022 9:00 A M Medical Record Number: [id number] Patient Account Number: [id number] Date of Birth/Sex: Treating RN: Aug 11, 1935 (87 y.o. [name] Primary Care Farhiya Rosten: [name] Other Clinician: Referring [name]: Treating [name]/Extender: [name] Weeks in [name]: 2 Compression Therapy Performed for  Wound Assessment: Wound #10 Right,Anterior Lower Leg Performed By: Clinician [name] Grills, RN Compression Type: Double Layer Post Procedure Diagnosis Same as Pre-procedure Electronic Signature(s) Signed: 06/16/2022 1:56:54 PM By: [name] Entered By: [name] on 06/07/2022 10:02:37 -------------------------------------------------------------------------------- Compression Therapy Details Patient Name: Date of Service: [name], [name] 06/07/2022 9:00 A M Medical Record Number: [id number] Patient Account Number: [id number] Date of Birth/Sex: Treating RN: 1935-07-08 (87 y.o. [name] Primary Care Elieser Tetrick: [name] Other Clinician: Referring [name]: Treating [name]/Extender: [name] Weeks in [name]: 2 Compression Therapy Performed for Wound Assessment: Wound #8 Left,Lateral Lower Leg Performed By: Clinician [name] Grills, RN Compression Type: Double Layer Post Procedure Diagnosis Same as [name], [name] ([id number]) [id number] Page 2 of 11 Electronic Signature(s) Signed: 06/16/2022 1:56:54 PM By: [name] Entered By: [name] on 06/07/2022 10:02:37 -------------------------------------------------------------------------------- Encounter Discharge Information Details Patient Name: Date of Service: [name], [name] 06/07/2022 9:00 A M Medical Record Number: [id number] Patient Account Number: [id number] Date of Birth/Sex: Treating RN: 02/21/1936 (87 y.o. [name] Primary Care Chyan Carnero: [name] Other Clinician: Referring [name]: Treating [name]/Extender: [name] Weeks in [name]: 2 Encounter Discharge Information Items Post Procedure Vitals Discharge Condition: Stable Temperature (F): 97.1 Ambulatory Status: Wheelchair Pulse (bpm): 80 Discharge Destination: Home Respiratory Rate (breaths/min): 18 Transportation:  Private Auto Blood Pressure (mmHg): 138/72 Accompanied By: wife Schedule Follow-up Appointment: Yes Clinical Summary of Care: Patient Declined Electronic Signature(s) Signed: 06/16/2022 1:56:54 PM By: [name] Entered By: [name] on 06/07/2022 10:43:47 -------------------------------------------------------------------------------- Lower Extremity Assessment Details Patient Name: Date of Service: [name], [name] 06/07/2022 9:00 A M Medical Record Number: [id number] Patient Account Number: [id number] Date of Birth/Sex: Treating RN: 1935-03-12 (87 y.o. [name] Primary Care Catherine Cubero: [name] Other Clinician: Referring Sanaya Gwilliam: Treating [name]/Extender: [name], [name]: 2 Edema Assessment Assessed: [Left: No] [Right: No] Edema: [Left: Yes] [Right: Yes] Calf Left: Right: Point of Measurement:  From Medial Instep 35.7 cm 38.6 cm Ankle Left: Right: Point of Measurement: From Medial Instep 22.3 cm 23.2 cm Vascular Assessment Pulses: Dorsalis Pedis Palpable: [Left:Yes] [Right:Yes] Electronic Signature(s) Signed: 06/16/2022 1:56:54 PM By: [name] Entered By: [name] on 06/07/2022 09:19:32 Multi Wound Chart Details -------------------------------------------------------------------------------- [name] ([id number]) [id number] Page 3 of 11 Patient Name: Date of Service: [name], [name] 06/07/2022 9:00 A M Medical Record Number: [id number] Patient Account Number: [id number] Date of Birth/Sex: Treating RN: August 23, 1935 (87 y.o. M) Primary Care Taysean Wager: [name] Other Clinician: Referring Demetre Monaco: Treating Ranyah Groeneveld/Extender: [name], [name]: 2 Vital Signs Height(in): 71 Pulse(bpm): 72 Weight(lbs): 190 Blood Pressure(mmHg): 148/77 Body Mass Index(BMI): 26.5 Temperature(F): 97.9 Respiratory Rate(breaths/min):  18 Wound Assessments Wound Number: Photos: Right, Anterior Lower Leg Right Calcaneus Right, Posterior Lower Leg Wound Location: Not Known Shear/Friction Gradually Appeared Wounding Event: Lymphedema Lymphedema Lymphedema Primary Etiology: Cataracts, Lymphedema, Arrhythmia, Cataracts, Lymphedema, Arrhythmia, Cataracts, Lymphedema, Arrhythmia, Comorbid History: Congestive Heart Failure, Coronary Congestive Heart Failure, Coronary Congestive Heart Failure, Coronary Artery Disease, Hypertension, Artery Disease, Hypertension, Artery Disease, Hypertension, Osteoarthritis, Received Radiation, Osteoarthritis, Received Radiation, Osteoarthritis, Received Radiation, Confinement Anxiety Confinement Anxiety Confinement Anxiety 06/04/2022 05/24/2022 05/10/2022 Date A cquired: 0 2 2 Weeks of [name]: Open Open Healed - Epithelialized Wound Status: No No No Wound Recurrence: No No Yes Clustered Wound: 3.2x1.2x0.1 0.5x0.3x0.2 0x0x0 Measurements L x W x D (cm) 3.016 0.118 0 A (cm) : rea 0.302 0.024 0 Volume (cm) : N/A -66.20% 100.00% % Reduction in A rea: N/A -242.90% 100.00% % Reduction in Volume: Full Thickness Without Exposed Full Thickness Without Exposed Full Thickness Without Exposed Classification: Support Structures Support Structures Support Structures Medium Medium Medium Exudate A mount: Serosanguineous Serosanguineous Serosanguineous Exudate Type: red, brown red, brown red, brown Exudate Color: Flat and Intact Distinct, outline attached Distinct, outline attached Wound Margin: None Present (0%) Large (67-100%) Large (67-100%) Granulation A mount: N/A Red Red, Pink Granulation Quality: Medium (34-66%) Small (1-33%) None Present (0%) Necrotic A mount: Eschar, Adherent Slough Adherent Slough N/A Necrotic Tissue: Fat Layer (Subcutaneous Tissue): Yes Fat Layer (Subcutaneous Tissue): Yes Fat Layer (Subcutaneous Tissue): Yes Exposed Structures: Fascia: No Fascia:  No Tendon: No Tendon: No Muscle: No Muscle: No Joint: No Joint: No Bone: No Bone: No None Small (1-33%) Large (67-100%) Epithelialization: Debridement - Selective/Open Wound Debridement - Selective/Open Wound Debridement - Selective/Open Wound Debridement: Necrotic/Eschar, Ambulance person, Ambulance person, [employer] Tissue Debrided: Non-Viable Tissue Non-Viable Tissue Non-Viable Tissue Level: 3.84 0.15 0.01 Debridement A (sq cm): rea Curette Curette Curette Instrument: Minimum Minimum Minimum Bleeding: Pressure Pressure Pressure Hemostasis A chieved: Debridement [name] Response: Procedure was tolerated well Procedure was tolerated well Procedure was tolerated well Post Debridement Measurements L x 3.2x1.2x0.1 0.5x0.3x0.2 0.1x0.1x0.1 W x D (cm) 0.302 0.024 0.001 Post Debridement Volume: (cm) No Abnormalities Noted Callus: Yes Excoriation: No Periwound Skin Texture: Induration: No Callus: No Crepitus: No Rash: No Scarring: No Dry/Scaly: Yes No Abnormalities Noted Maceration: No Periwound Skin Moisture: Maceration: No Dry/Scaly: No Hemosiderin Staining: Yes No Abnormalities Noted Hemosiderin Staining: Yes Periwound Skin Color: Atrophie Blanche: No Cyanosis: No [name], [name] ([id number]) [id number] Page 4 of 11 Ecchymosis: No Erythema: No Mottled: No Pallor: No Rubor: No N/A No Abnormality No Abnormality Temperature: Yes Yes N/A Tenderness on Palpation: Compression Therapy Debridement Debridement Procedures Performed: Debridement Wound Number: 8 N/A N/A Photos: N/A N/A Left, Lateral Lower Leg N/A N/A Wound Location: Trauma N/A N/A Wounding Event: Lymphedema N/A N/A Primary Etiology:  Cataracts, Lymphedema, Arrhythmia, N/A N/A Comorbid History: Congestive Heart Failure, Coronary Artery Disease, Hypertension, Osteoarthritis, Received Radiation, Confinement Anxiety 05/09/2022 N/A N/A Date A cquired: 2  N/A N/A Weeks of [name]: Open N/A N/A Wound Status: No N/A N/A Wound Recurrence: No N/A N/A Clustered Wound: 2.4x2x0.1 N/A N/A Measurements L x W x D (cm) 3.77 N/A N/A A (cm) : rea 0.377 N/A N/A Volume (cm) : 38.50% N/A N/A % Reduction in A rea: 38.50% N/A N/A % Reduction in Volume: Full Thickness Without Exposed N/A N/A Classification: Support Structures Medium N/A N/A Exudate A mount: Serosanguineous N/A N/A Exudate Type: red, brown N/A N/A Exudate Color: Distinct, outline attached N/A N/A Wound Margin: Medium (34-66%) N/A N/A Granulation A mount: Red N/A N/A Granulation Quality: Medium (34-66%) N/A N/A Necrotic A mount: N/A N/A N/A Necrotic Tissue: Fat Layer (Subcutaneous Tissue): Yes N/A N/A Exposed Structures: Fascia: No Tendon: No Muscle: No Joint: No Bone: No Medium (34-66%) N/A N/A Epithelialization: Debridement - Selective/Open Wound N/A N/A Debridement: Necrotic/Eschar, Slough N/A N/A Tissue Debrided: Non-Viable Tissue N/A N/A Level: 4.8 N/A N/A Debridement A (sq cm): rea Curette N/A N/A Instrument: Minimum N/A N/A Bleeding: Pressure N/A N/A Hemostasis A chieved: Debridement [name] Response: Procedure was tolerated well N/A N/A Post Debridement Measurements L x 2.4x2x0.1 N/A N/A W x D (cm) 0.377 N/A N/A Post Debridement Volume: (cm) Excoriation: No N/A N/A Periwound Skin Texture: Induration: No Callus: No Crepitus: No Rash: No Scarring: No Maceration: No N/A N/A Periwound Skin Moisture: Dry/Scaly: No Atrophie Blanche: No N/A N/A Periwound Skin Color: Cyanosis: No Ecchymosis: No Erythema: No Hemosiderin Staining: No Mottled: No Pallor: No Rubor: No No Abnormality N/A N/A Temperature: Compression Therapy N/A N/A Procedures Performed: Debridement [name], [name] ([id number]) [id number] Page 5 of 11 [name] Notes Electronic Signature(s) Signed: 06/07/2022 10:16:57 AM By: Duanne Guess MD FACS Entered By: Duanne Guess on 06/07/2022 10:16:57 -------------------------------------------------------------------------------- Multi-Disciplinary Care Plan Details Patient Name: Date of Service: [name], Kadarious [name]. 06/07/2022 9:00 A M Medical Record Number: [id number] Patient Account Number: [id number] Date of Birth/Sex: Treating RN: [birth year] (87 y.o. [name] Primary Care Damek Ende: [name] Other Clinician: Referring Reigna Ruperto: Treating Antonis Lor/Extender: [name] Weeks in [name]: 2 Active Inactive Abuse / Safety / Falls / Self Care Management Nursing Diagnoses: Impaired physical mobility Potential for falls Goals: Patient/caregiver will verbalize/demonstrate measures taken to improve the patient's personal safety Date Initiated: 05/24/2022 Target Resolution Date: 07/08/2022 Goal Status: Active Patient/caregiver will verbalize/demonstrate measures taken to prevent injury and/or falls Date Initiated: 05/24/2022 Target Resolution Date: 07/08/2022 Goal Status: Active Interventions: Assess fall risk on admission and as needed Notes: Wound/Skin Impairment Nursing Diagnoses: Impaired tissue integrity Knowledge deficit related to ulceration/compromised skin integrity Goals: Patient/caregiver will verbalize understanding of skin care regimen Date Initiated: 05/24/2022 Target Resolution Date: 07/08/2022 Goal Status: Active Interventions: Assess ulceration(s) every visit [name] Activities: Skin care regimen initiated : 05/24/2022 Topical wound management initiated : 05/24/2022 Notes: Electronic Signature(s) Signed: 06/16/2022 1:56:54 PM By: [name] Entered By: [name] on 06/07/2022 09:43:25 -------------------------------------------------------------------------------- Pain Assessment Details Patient Name: Date of Service: [name], Sahaj [name]. 06/07/2022 9:00 A M Medical Record Number:  [id number] Patient Account Number: [id number] Date of Birth/Sex: Treating RN: 1935/03/12 (87 y.o. [name] Primary Care Mercades Bajaj: [name] Other Clinician: NIKOLAJ, GERAGHTY ([id number]) [id number] Page 6 of 11 Referring Geniece Akers: Treating Jasmin Winberry/Extender: [name], [name]: 2 Active Problems Location of Pain Severity and Description of Pain Patient Has Paino Yes  Site Locations Pain Location: Generalized Pain Duration of the Pain. Constant / Intermittento Constant Rate the pain. Current Pain Level: 7 Worst Pain Level: 7 Least Pain Level: 7 Character of Pain Describe the Pain: Aching, Shooting Pain Management and Medication Current Pain Management: Electronic Signature(s) Signed: 06/16/2022 1:56:54 PM By: [name] Entered By: [name] on 06/07/2022 09:09:41 -------------------------------------------------------------------------------- Patient/Caregiver Education Details Patient Name: Date of Service: Otilio Saber 4/9/2024andnbsp9:00 A M Medical Record Number: [id number] Patient Account Number: [id number] Date of Birth/Gender: Treating RN: 21-Sep-1935 (87 y.o. [name] Primary Care Physician: [name] Other Clinician: Referring Physician: Treating Physician/Extender: Willey Blade in [name]: 2 Education Assessment Education Provided To: Patient and Caregiver Education Topics Provided Pain: Methods: Explain/Verbal Responses: State content correctly Electronic Signature(s) Signed: 06/16/2022 1:56:54 PM By: [name] Entered By: [name] on 06/07/2022 09:43:44 -------------------------------------------------------------------------------- Wound Assessment Details Patient Name: Date of Service: [name], Yaasir [name]. 06/07/2022 9:00 A M Medical Record Number: [id number] Patient Account Number: [id number] JOHNROSS, NABOZNY  ([id number]) [phone].pdf Page 7 of 11 Date of Birth/Sex: Treating RN: [birth year] (87 y.o. [name] Primary Care Gerhardt Gleed: [name] Other Clinician: Referring Lillian Ballester: Treating Asaiah Scarber/Extender: [name] Weeks in [name]: 2 Wound Status Wound Number: 10 Primary Lymphedema Etiology: Wound Location: Right, Anterior Lower Leg Wound Open Wounding Event: Not Known Status: Date Acquired: 06/04/2022 Comorbid Cataracts, Lymphedema, Arrhythmia, Congestive Heart Failure, Weeks Of [name]: 0 History: Coronary Artery Disease, Hypertension, Osteoarthritis, Received Clustered Wound: No Radiation, Confinement Anxiety Photos Wound Measurements Length: (cm) 3.2 Width: (cm) 1.2 Depth: (cm) 0.1 Area: (cm) 3.016 Volume: (cm) 0.302 % Reduction in Area: % Reduction in Volume: Epithelialization: None Tunneling: No Undermining: No Wound Description Classification: Full Thickness Without Exposed Support Structures Wound Margin: Flat and Intact Exudate Amount: Medium Exudate Type: Serosanguineous Exudate Color: red, brown Foul Odor After Cleansing: No Slough/Fibrino Yes Wound Bed Granulation Amount: None Present (0%) Exposed Structure Necrotic Amount: Medium (34-66%) Fat Layer (Subcutaneous Tissue) Exposed: Yes Necrotic Quality: Eschar, Adherent Slough Periwound Skin Texture Texture Color No Abnormalities Noted: Yes No Abnormalities Noted: No Hemosiderin Staining: Yes Moisture No Abnormalities Noted: No Temperature / Pain Dry / Scaly: Yes Tenderness on Palpation: Yes Maceration: No Electronic Signature(s) Signed: 06/16/2022 1:56:54 PM By: [name] Entered By: [name] on 06/07/2022 09:39:07 -------------------------------------------------------------------------------- Wound Assessment Details Patient Name: Date of Service: [name], [name] 06/07/2022 9:00 A M Medical Record Number:  [id number] Patient Account Number: [id number] Date of Birth/Sex: Treating RN: 01-Feb-1936 (87 y.o. [name] Primary Care Brittanya Winburn: [name] Other Clinician: Referring Latriece Anstine: Treating Klaire Court/Extender: [name] Weeks in [name]: 2 Wound Status [name], [name] ([id number]) [id number] Page 8 of 11 Wound Number: 5 Primary Lymphedema Etiology: Wound Location: Right Calcaneus Wound Open Wounding Event: Shear/Friction Status: Date Acquired: 05/24/2022 Comorbid Cataracts, Lymphedema, Arrhythmia, Congestive Heart Failure, Weeks Of [name]: 2 History: Coronary Artery Disease, Hypertension, Osteoarthritis, Received Clustered Wound: No Radiation, Confinement Anxiety Photos Wound Measurements Length: (cm) 0.5 Width: (cm) 0.3 Depth: (cm) 0.2 Area: (cm) 0.118 Volume: (cm) 0.024 % Reduction in Area: -66.2% % Reduction in Volume: -242.9% Epithelialization: Small (1-33%) Wound Description Classification: Full Thickness Without Exposed Suppor Wound Margin: Distinct, outline attached Exudate Amount: Medium Exudate Type: Serosanguineous Exudate Color: red, brown t Structures Foul Odor After Cleansing: No Slough/Fibrino Yes Wound Bed Granulation Amount: Large (67-100%) Exposed Structure Granulation Quality: Red Fascia Exposed: No Necrotic Amount: Small (1-33%) Fat Layer (Subcutaneous Tissue) Exposed: Yes Necrotic Quality: Adherent Slough Tendon  Exposed: No Muscle Exposed: No Joint Exposed: No Bone Exposed: No Periwound Skin Texture Texture Color No Abnormalities Noted: No No Abnormalities Noted: Yes Callus: Yes Temperature / Pain Temperature: No Abnormality Moisture No Abnormalities Noted: Yes Tenderness on Palpation: Yes Electronic Signature(s) Signed: 06/16/2022 1:56:54 PM By: [name] Entered By: [name] on 06/07/2022  09:40:23 -------------------------------------------------------------------------------- Wound Assessment Details Patient Name: Date of Service: [name], [name] 06/07/2022 9:00 A M Medical Record Number: [id number] Patient Account Number: [id number] Date of Birth/Sex: Treating RN: 1935-03-20 (87 y.o. [name] Primary Care Johnathyn Viscomi: [name] Other Clinician: Referring Ellana Kawa: Treating Tai Syfert/Extender: [name], [name]: 2 Wound Status Wound Number: 6 Primary Lymphedema Etiology: Wound Location: Right, Posterior Lower Leg Wound Healed - Epithelialized Wounding Event: Gradually Appeared Status: Date Acquired: 05/10/2022 [name], STRAHLE ([id number]) [phone].pdf Page 9 of 11 Date Acquired: 05/10/2022 Comorbid Cataracts, Lymphedema, Arrhythmia, Congestive Heart Failure, Weeks Of [name]: 2 History: Coronary Artery Disease, Hypertension, Osteoarthritis, Received Clustered Wound: Yes Radiation, Confinement Anxiety Photos Wound Measurements Length: (cm) Width: (cm) Depth: (cm) Area: (cm) Volume: (cm) 0 % Reduction in Area: 100% 0 % Reduction in Volume: 100% 0 Epithelialization: Large (67-100%) 0 0 Wound Description Classification: Full Thickness Without Exposed Suppor Wound Margin: Distinct, outline attached Exudate Amount: Medium Exudate Type: Serosanguineous Exudate Color: red, brown t Structures Foul Odor After Cleansing: No Slough/Fibrino No Wound Bed Granulation Amount: Large (67-100%) Exposed Structure Granulation Quality: Red, Pink Fascia Exposed: No Necrotic Amount: None Present (0%) Fat Layer (Subcutaneous Tissue) Exposed: Yes Tendon Exposed: No Muscle Exposed: No Joint Exposed: No Bone Exposed: No Periwound Skin Texture Texture Color No Abnormalities Noted: Yes No Abnormalities Noted: No Atrophie Blanche: No Moisture Cyanosis: No No Abnormalities Noted:  No Ecchymosis: No Dry / Scaly: No Erythema: No Maceration: No Hemosiderin Staining: Yes Mottled: No Pallor: No Rubor: No Temperature / Pain Temperature: No Abnormality [name] Notes Wound #6 (Lower Leg) Wound Laterality: Right, Posterior Cleanser Peri-Wound Care Topical Primary Dressing Secondary Dressing Secured With Compression Wrap Compression Stockings Add-Ons KARAPET, MARTELLI [name] ([id number]) [id number] Page 10 of 11 Electronic Signature(s) Signed: 06/16/2022 1:56:54 PM By: [name] Entered By: [name] on 06/07/2022 10:14:29 -------------------------------------------------------------------------------- Wound Assessment Details Patient Name: Date of Service: [name], [name] [name]. 06/07/2022 9:00 A M Medical Record Number: [id number] Patient Account Number: [id number] Date of Birth/Sex: Treating RN: 22-Dec-1935 (87 y.o. [name] Primary Care Gustavia Carie: [name] Other Clinician: Referring Baxter Gonzalez: Treating Donne Robillard/Extender: [name] Weeks in [name]: 2 Wound Status Wound Number: 8 Primary Lymphedema Etiology: Wound Location: Left, Lateral Lower Leg Wound Open Wounding Event: Trauma Status: Date Acquired: 05/09/2022 Comorbid Cataracts, Lymphedema, Arrhythmia, Congestive Heart Failure, Weeks Of [name]: 2 History: Coronary Artery Disease, Hypertension, Osteoarthritis, Received Clustered Wound: No Radiation, Confinement Anxiety Photos Wound Measurements Length: (cm) 2.4 Width: (cm) 2 Depth: (cm) 0.1 Area: (cm) 3.77 Volume: (cm) 0.377 % Reduction in Area: 38.5% % Reduction in Volume: 38.5% Epithelialization: Medium (34-66%) Wound Description Classification: Full Thickness Without Exposed Suppor Wound Margin: Distinct, outline attached Exudate Amount: Medium Exudate Type: Serosanguineous Exudate Color: red, brown t Structures Foul Odor After Cleansing: No Slough/Fibrino  Yes Wound Bed Granulation Amount: Medium (34-66%) Exposed Structure Granulation Quality: Red Fascia Exposed: No Necrotic Amount: Medium (34-66%) Fat Layer (Subcutaneous Tissue) Exposed: Yes Tendon Exposed: No Muscle Exposed: No Joint Exposed: No Bone Exposed: No Periwound Skin Texture Texture Color No Abnormalities Noted: Yes No Abnormalities Noted: No Atrophie Blanche: No Moisture Cyanosis: No No Abnormalities Noted: Yes  Ecchymosis: No Erythema: No Hemosiderin Staining: No Mottled: No Pallor: No Rubor: No Temperature / Pain BILTON, Zechariah [name] ([id number]) [id number] Page 11 of 11 Temperature: No Abnormality Electronic Signature(s) Signed: 06/16/2022 1:56:54 PM By: [name] Entered By: [name] on 06/07/2022 09:42:42 -------------------------------------------------------------------------------- Vitals Details Patient Name: Date of Service: [name], Simmie [name]. 06/07/2022 9:00 A M Medical Record Number: [id number] Patient Account Number: [id number] Date of Birth/Sex: Treating RN: 05/13/1935 (87 y.o. [name] Primary Care Andreana Klingerman: [name] Other Clinician: Referring Lonie Newsham: Treating Gennett Garcia/Extender: [name] Weeks in [name]: 2 Vital Signs Time Taken: 09:05 Temperature (F): 97.9 Height (in): 71 Pulse (bpm): 72 Weight (lbs): 190 Respiratory Rate (breaths/min): 18 Body Mass Index (BMI): 26.5 Blood Pressure (mmHg): 148/77 Reference Range: 80 - 120 mg / dl Electronic Signature(s) Signed: 06/16/2022 1:56:54 PM By: [name] Entered By: [name] on 06/07/2022 09:08:26

## 2022-06-21 ENCOUNTER — Observation Stay (HOSPITAL_COMMUNITY)
Admission: EM | Admit: 2022-06-21 | Discharge: 2022-06-22 | Disposition: A | Discharge status: Home Health | Payer: Medicare Other | Attending: Emergency Medicine | Admitting: Emergency Medicine

## 2022-06-21 ENCOUNTER — Emergency Department (HOSPITAL_COMMUNITY): Payer: Medicare Other

## 2022-06-21 ENCOUNTER — Other Ambulatory Visit: Payer: Self-pay

## 2022-06-21 ENCOUNTER — Encounter (HOSPITAL_COMMUNITY): Payer: Self-pay

## 2022-06-21 DIAGNOSIS — I2510 Atherosclerotic heart disease of native coronary artery without angina pectoris: Secondary | ICD-10-CM | POA: Insufficient documentation

## 2022-06-21 DIAGNOSIS — G20B2 Parkinson's disease with dyskinesia, with fluctuations: Secondary | ICD-10-CM

## 2022-06-21 DIAGNOSIS — N183 Chronic kidney disease, stage 3 unspecified: Secondary | ICD-10-CM | POA: Diagnosis not present

## 2022-06-21 DIAGNOSIS — G20A1 Parkinson's disease without dyskinesia, without mention of fluctuations: Secondary | ICD-10-CM | POA: Diagnosis present

## 2022-06-21 DIAGNOSIS — I5032 Chronic diastolic (congestive) heart failure: Secondary | ICD-10-CM | POA: Diagnosis not present

## 2022-06-21 DIAGNOSIS — I890 Lymphedema, not elsewhere classified: Secondary | ICD-10-CM | POA: Diagnosis not present

## 2022-06-21 DIAGNOSIS — Z85828 Personal history of other malignant neoplasm of skin: Secondary | ICD-10-CM | POA: Insufficient documentation

## 2022-06-21 DIAGNOSIS — Z043 Encounter for examination and observation following other accident: Secondary | ICD-10-CM | POA: Diagnosis not present

## 2022-06-21 DIAGNOSIS — W19XXXA Unspecified fall, initial encounter: Secondary | ICD-10-CM | POA: Diagnosis not present

## 2022-06-21 DIAGNOSIS — I4821 Permanent atrial fibrillation: Secondary | ICD-10-CM

## 2022-06-21 DIAGNOSIS — Z7901 Long term (current) use of anticoagulants: Secondary | ICD-10-CM | POA: Diagnosis not present

## 2022-06-21 DIAGNOSIS — Z8546 Personal history of malignant neoplasm of prostate: Secondary | ICD-10-CM | POA: Insufficient documentation

## 2022-06-21 DIAGNOSIS — I130 Hypertensive heart and chronic kidney disease with heart failure and stage 1 through stage 4 chronic kidney disease, or unspecified chronic kidney disease: Secondary | ICD-10-CM | POA: Diagnosis not present

## 2022-06-21 DIAGNOSIS — Z79899 Other long term (current) drug therapy: Secondary | ICD-10-CM | POA: Diagnosis not present

## 2022-06-21 DIAGNOSIS — E876 Hypokalemia: Secondary | ICD-10-CM | POA: Insufficient documentation

## 2022-06-21 DIAGNOSIS — R55 Syncope and collapse: Secondary | ICD-10-CM | POA: Diagnosis not present

## 2022-06-21 DIAGNOSIS — R296 Repeated falls: Principal | ICD-10-CM | POA: Insufficient documentation

## 2022-06-21 DIAGNOSIS — S50312A Abrasion of left elbow, initial encounter: Secondary | ICD-10-CM | POA: Diagnosis not present

## 2022-06-21 DIAGNOSIS — R7989 Other specified abnormal findings of blood chemistry: Secondary | ICD-10-CM | POA: Diagnosis not present

## 2022-06-21 DIAGNOSIS — I4510 Unspecified right bundle-branch block: Secondary | ICD-10-CM | POA: Diagnosis not present

## 2022-06-21 DIAGNOSIS — N179 Acute kidney failure, unspecified: Secondary | ICD-10-CM | POA: Insufficient documentation

## 2022-06-21 DIAGNOSIS — G20C Parkinsonism, unspecified: Secondary | ICD-10-CM | POA: Insufficient documentation

## 2022-06-21 DIAGNOSIS — Z955 Presence of coronary angioplasty implant and graft: Secondary | ICD-10-CM | POA: Diagnosis not present

## 2022-06-21 DIAGNOSIS — I4891 Unspecified atrial fibrillation: Secondary | ICD-10-CM | POA: Diagnosis present

## 2022-06-21 DIAGNOSIS — Z7984 Long term (current) use of oral hypoglycemic drugs: Secondary | ICD-10-CM | POA: Insufficient documentation

## 2022-06-21 DIAGNOSIS — N189 Chronic kidney disease, unspecified: Secondary | ICD-10-CM | POA: Diagnosis not present

## 2022-06-21 DIAGNOSIS — I5030 Unspecified diastolic (congestive) heart failure: Secondary | ICD-10-CM | POA: Diagnosis not present

## 2022-06-21 DIAGNOSIS — I10 Essential (primary) hypertension: Secondary | ICD-10-CM | POA: Diagnosis not present

## 2022-06-21 LAB — CBC WITH DIFFERENTIAL/PLATELET
Abs Immature Granulocytes: 0.04 10*3/uL (ref 0.00–0.07)
Basophils Absolute: 0.1 10*3/uL (ref 0.0–0.1)
Basophils Relative: 1 %
Eosinophils Absolute: 0.1 10*3/uL (ref 0.0–0.5)
Eosinophils Relative: 1 %
HCT: 41 % (ref 39.0–52.0)
Hemoglobin: 13.4 g/dL (ref 13.0–17.0)
Immature Granulocytes: 1 %
Lymphocytes Relative: 8 %
Lymphs Abs: 0.7 10*3/uL (ref 0.7–4.0)
MCH: 29.2 pg (ref 26.0–34.0)
MCHC: 32.7 g/dL (ref 30.0–36.0)
MCV: 89.3 fL (ref 80.0–100.0)
Monocytes Absolute: 0.5 10*3/uL (ref 0.1–1.0)
Monocytes Relative: 5 %
Neutro Abs: 7.1 10*3/uL (ref 1.7–7.7)
Neutrophils Relative %: 84 %
Platelets: 242 10*3/uL (ref 150–400)
RBC: 4.59 MIL/uL (ref 4.22–5.81)
RDW: 16.4 % — ABNORMAL HIGH (ref 11.5–15.5)
WBC: 8.4 10*3/uL (ref 4.0–10.5)
nRBC: 0 % (ref 0.0–0.2)

## 2022-06-21 LAB — COMPREHENSIVE METABOLIC PANEL
ALT: 5 U/L (ref 0–44)
AST: 17 U/L (ref 15–41)
Albumin: 3.6 g/dL (ref 3.5–5.0)
Alkaline Phosphatase: 140 U/L — ABNORMAL HIGH (ref 38–126)
Anion gap: 16 — ABNORMAL HIGH (ref 5–15)
BUN: 45 mg/dL — ABNORMAL HIGH (ref 8–23)
CO2: 31 mmol/L (ref 22–32)
Calcium: 9.4 mg/dL (ref 8.9–10.3)
Chloride: 91 mmol/L — ABNORMAL LOW (ref 98–111)
Creatinine, Ser: 2.11 mg/dL — ABNORMAL HIGH (ref 0.61–1.24)
GFR, Estimated: 30 mL/min — ABNORMAL LOW (ref >60)
Glucose, Bld: 102 mg/dL — ABNORMAL HIGH (ref 70–99)
Potassium: 3.5 mmol/L (ref 3.5–5.1)
Sodium: 138 mmol/L (ref 135–145)
Total Bilirubin: 2.5 mg/dL — ABNORMAL HIGH (ref 0.3–1.2)
Total Protein: 7.9 g/dL (ref 6.5–8.1)

## 2022-06-21 LAB — MAGNESIUM: Magnesium: 2.2 mg/dL (ref 1.7–2.4)

## 2022-06-21 LAB — TROPONIN I (HIGH SENSITIVITY)
Troponin I (High Sensitivity): 19 ng/L — ABNORMAL HIGH (ref <18)
Troponin I (High Sensitivity): 20 ng/L — ABNORMAL HIGH (ref <18)

## 2022-06-21 LAB — BRAIN NATRIURETIC PEPTIDE: B Natriuretic Peptide: 216.6 pg/mL — ABNORMAL HIGH (ref 0.0–100.0)

## 2022-06-21 MED ORDER — ACETAMINOPHEN 650 MG RE SUPP: 650.0000 mg | Freq: Four times a day (QID) | RECTAL | Status: DC | PRN

## 2022-06-21 MED ORDER — CARBIDOPA-LEVODOPA 25-100 MG PO TABS
2.0000 | ORAL_TABLET | Freq: Three times a day (TID) | ORAL | Status: DC
  Administered 2022-06-21 – 2022-06-22 (×4): 2 via ORAL
  Filled 2022-06-21 (×4): qty 2

## 2022-06-21 MED ORDER — SODIUM CHLORIDE 0.9% FLUSH: 3.0000 mL | Freq: Two times a day (BID) | INTRAVENOUS | Status: DC

## 2022-06-21 MED ORDER — DILTIAZEM HCL ER COATED BEADS 120 MG PO CP24
120.0000 mg | ORAL_CAPSULE | Freq: Every day | ORAL | Status: DC
  Administered 2022-06-21 – 2022-06-22 (×2): 120 mg via ORAL
  Filled 2022-06-21 (×3): qty 1

## 2022-06-21 MED ORDER — ACETAMINOPHEN 325 MG PO TABS: 650.0000 mg | ORAL_TABLET | Freq: Four times a day (QID) | ORAL | Status: DC | PRN

## 2022-06-21 MED ORDER — FUROSEMIDE 40 MG PO TABS
80.0000 mg | ORAL_TABLET | Freq: Three times a day (TID) | ORAL | Status: DC
  Administered 2022-06-21: 80 mg via ORAL
  Filled 2022-06-21: qty 2
  Filled 2022-06-21: qty 4

## 2022-06-21 MED ORDER — APIXABAN 2.5 MG PO TABS
2.5000 mg | ORAL_TABLET | Freq: Two times a day (BID) | ORAL | Status: DC
  Administered 2022-06-21 – 2022-06-22 (×3): 2.5 mg via ORAL
  Filled 2022-06-21 (×3): qty 1

## 2022-06-21 MED ORDER — ALBUTEROL SULFATE (2.5 MG/3ML) 0.083% IN NEBU: 2.5000 mg | INHALATION_SOLUTION | Freq: Four times a day (QID) | RESPIRATORY_TRACT | Status: DC | PRN

## 2022-06-21 MED ORDER — ROSUVASTATIN CALCIUM 5 MG PO TABS
10.0000 mg | ORAL_TABLET | Freq: Every day | ORAL | Status: DC
  Administered 2022-06-21: 10 mg via ORAL
  Filled 2022-06-21 (×2): qty 2

## 2022-06-21 MED ORDER — METOLAZONE 2.5 MG PO TABS
2.5000 mg | ORAL_TABLET | ORAL | Status: DC
  Administered 2022-06-22: 2.5 mg via ORAL
  Filled 2022-06-21 (×2): qty 1

## 2022-06-21 NOTE — ED Triage Notes (Addendum)
Pt to ED by EMS from home following an unwitnessed fall. Pt was sitting in his recliner when his wife last saw him, when she got up he was lying in the front yard. Pt denies any LOC, however he does not remember the incident. Abrasions noted to both elbows and an abrasion to his LLE. Arrives A+O, VSS, NADN, pt denies ant complaints at this time. Pt is taking eliquis. Pt is a kidney pt that presents with pitting edema to both LE's.

## 2022-06-21 NOTE — ED Provider Notes (Signed)
Assumed care from Dr. Clayborne Dana.  Patient here after falling at home versus syncope.  Somehow ended up in the yard when he normally sleeps in the recliner.  Does not remember falling.  Sustained abrasions to his lower extremities and elbows.  Possible LOC.  Does take Eliquis for atrial fibrillation.  Awaiting second troponin and ambulatory trial.  Labs do show some worsening kidney function.  He has chronic lower extremity edema at baseline which is unchanged.  Denies chest pain, shortness of breath, nausea or vomiting.  Troponin minimally elevated and flat.  Low suspicion for ACS.  He is able to ambulate with his walker with wife's assistance which she confirms this is baseline.  Concern for syncope in the setting of possible dehydration.  Patient and wife agreeable to observation admission.  Will consult TOC for home health options  D/w Dr. Katrinka Blazing.    Glynn Octave, MD 06/21/22 [phone]

## 2022-06-21 NOTE — ED Notes (Signed)
Pt ambulated with walker with wife assistance. Pt and wife relays pt is walking at his baseline. Md notified.

## 2022-06-21 NOTE — H&P (Addendum)
History and Physical    Patient: [name] UJW:[id number] DOB: May 28, 1935 DOA: 06/21/2022 DOS: the patient was seen and examined on 06/21/2022 PCP: Merri Brunette, MD  Patient coming from: Home via EMS  Chief Complaint:  Chief Complaint  Patient presents with   Fall   HPI: [name] is a 87 y.o. male with medical history significant of hypertension, dyslipidemia, persistent atrial fibrillation on Eliquis, CAD, Parkinson's disease, and lymphedema who presents after having a fall at home.  History is obtained from the patient who has some memory issues as reported by his wife.  He normally sleeps in a recliner in the living room at baseline and ambulates with the use of a walker, but has limited ability.  Sometime in the middle of the night the patient reported hearing someone at the front door.  He got his walker and went to the front door.  There is reportedly a step off from the door and wife reports that his walker was found outside of the house and he was found down on the concrete with abrasions to the right side of his body.  Unclear if patient passed out or lost his footing.  He was unaware if there was any loss of consciousness.  Initially with EMS patient was reported to not recall what occurred that led him to being found outside.  His wife makes note that he recently he sleeps a lot during the day and had been having issues with his lymphedema pumps not working properly and they were causing wounds on his legs.  The representative for the company of [place] to come out tomorrow to evaluate.  Patient denied having any recent fever, chills, nausea, vomiting, or diarrhea.   In the emergency department patient was noted be afebrile with vital signs relatively maintained.  Labs significant for BUN 45, creatinine 2.11, alkaline phosphatase 140, total bilirubin 2.5, BNP 216.6, high-sensitivity troponins 19-> 20.  Chest x-ray noted chronic low lung volumes with asymmetric  right diaphragm elevation and mild electricity/scarring.  CT scan of the head did not note any acute abnormality.  TRH called to admit.  Review of Systems: As mentioned in the history of present illness. All other systems reviewed and are negative. Past Medical History:  Diagnosis Date   Arthritis    "knees; lower back" (08/09/2016)   Atrial fibrillation    remote hx/notes 08/07/2016   Coronary artery disease    Episodes of trembling    "most of the time it's my left hand" (08/09/2016)   GI bleeding 08/2017   High cholesterol    History of kidney stones    Hypertension    Melanoma of shoulder 2000s   "left"   Mitral regurgitation and mitral stenosis    hx/notes 08/07/2016   Moderate aortic stenosis    hx/notes 08/07/2016   Parkinson's disease    Prostate cancer 2011   hx/notes 08/07/2016   Past Surgical History:  Procedure Laterality Date   CARDIAC CATHETERIZATION  ~ [address], [place]"   CARDIOVERSION N/A 01/26/2017   Procedure: CARDIOVERSION;  Surgeon: Elder Negus, MD;  Location: [facility] ENDOSCOPY;  Service: Cardiovascular;  Laterality: N/A;   CARDIOVERSION N/A 03/06/2018   Procedure: CARDIOVERSION;  Surgeon: Elder Negus, MD;  Location: [facility] ENDOSCOPY;  Service: Cardiovascular;  Laterality: N/A;   CATARACT EXTRACTION W/ INTRAOCULAR LENS  IMPLANT, BILATERAL Bilateral 2014-02/2016   left-right; hx/notes 08/07/2016   COLONOSCOPY WITH PROPOFOL N/A 09/25/2017   Procedure: COLONOSCOPY WITH PROPOFOL;  Surgeon:  Carman Ching, MD;  Location: [facility] ENDOSCOPY;  Service: Endoscopy;  Laterality: N/A;   CORONARY ANGIOPLASTY WITH STENT PLACEMENT  08/09/2016   CORONARY ATHERECTOMY N/A 08/09/2016   Procedure: Coronary Atherectomy;  Surgeon: Yates Decamp, MD;  Location: [facility] INVASIVE CV LAB;  Service: Cardiovascular;  Laterality: N/A;   CORONARY PRESSURE/FFR STUDY N/A 08/09/2016   Procedure: Intravascular Pressure Wire/FFR Study;  Surgeon: Yates Decamp, MD;  Location: [facility] INVASIVE CV LAB;   Service: Cardiovascular;  Laterality: N/A;   CORONARY STENT INTERVENTION N/A 08/09/2016   Procedure: Coronary Stent Intervention;  Surgeon: Yates Decamp, MD;  Location: [facility] INVASIVE CV LAB;  Service: Cardiovascular;  Laterality: N/A;  LAD   EYE SURGERY Bilateral 05/2016   "laser on both lenses"   FEMUR IM NAIL Left 05/20/2020   Procedure: INTRAMEDULLARY (IM) NAIL FEMORAL;  Surgeon: Samson Frederic, MD;  Location: [facility] ORS;  Service: Orthopedics;  Laterality: Left;   HEMORRHOID SURGERY N/A 09/27/2017   Procedure: HEMORRHOIDECTOMY;  Surgeon: Harriette Bouillon, MD;  Location: [facility] OR;  Service: General;  Laterality: N/A;   INGUINAL HERNIA REPAIR Left 1991   INSERTION PROSTATE RADIATION SEED  06/25/2009   LAPAROSCOPIC CHOLECYSTECTOMY  1993   MELANOMA EXCISION Left 1990s   "shoulder"   MELANOMA EXCISION Right 08/2018   right shoulder   MELANOMA EXCISION  04/20/2021   RIGHT HEART CATH N/A 03/20/2018   Procedure: RIGHT HEART CATH;  Surgeon: Elder Negus, MD;  Location: [facility] INVASIVE CV LAB;  Service: Cardiovascular;  Laterality: N/A;   RIGHT/LEFT HEART CATH AND CORONARY ANGIOGRAPHY N/A 08/09/2016   Procedure: Right/Left Heart Cath and Coronary Angiography;  Surgeon: Yates Decamp, MD;  Location: [facility] INVASIVE CV LAB;  Service: Cardiovascular;  Laterality: N/A;   SURGERY FOR BROKEN FEMUR Left 05/24/2020   TEE WITHOUT CARDIOVERSION N/A 01/25/2017   Procedure: TRANSESOPHAGEAL ECHOCARDIOGRAM (TEE);  Surgeon: Elder Negus, MD;  Location: [facility] ENDOSCOPY;  Service: Cardiovascular;  Laterality: N/A;   Social History:  reports that he has never smoked. He has never used smokeless tobacco. He reports current alcohol use of about 14.0 standard drinks of alcohol per week. He reports that he does not use drugs.  Allergies  Allergen Reactions   Nsaids Other (See Comments)    Cannot have any of these because he is currently taking Eliquis    Family History  Problem Relation Age of Onset   Heart attack  Father    Cerebral aneurysm Sister    Healthy Daughter     Prior to Admission medications   Medication Sig Start Date End Date Taking? Authorizing Provider  acetaminophen (TYLENOL) 325 MG tablet Take 1-2 tablets (325-650 mg total) by mouth every 6 (six) hours as needed for mild pain (pain score 1-3 or temp > 100.5). 05/21/20   Darrick Grinder, PA-C  apixaban (ELIQUIS) 2.5 MG TABS tablet Take 1 tablet (2.5 mg total) by mouth 2 (two) times daily. (0800 & 2000) Patient taking differently: Take 5 mg by mouth 2 (two) times daily. (0800 & 2000) 01/26/21   Patwardhan, Manish J, MD  carbidopa-levodopa (SINEMET IR) 25-100 MG tablet TAKE 2 TABLETS THREE TIMES DAILY AT 7:00AM, 11:00AM AND 4:00PM 04/18/22   Tat, Octaviano Batty, DO  Cholecalciferol (VITAMIN D-3) 1000 units CAPS Take 1,000 Units by mouth daily. (0800)    [provider]  dapagliflozin propanediol (FARXIGA) 10 MG TABS tablet Take 1 tablet by mouth daily.    [provider]  diltiazem (CARDIZEM CD) 120 MG 24 hr capsule TAKE 1 CAPSULE EVERY  DAY 01/31/22   Patwardhan, Anabel Bene, MD  furosemide (LASIX) 40 MG tablet Take 1 tablet (40 mg total) by mouth 2 (two) times daily. Patient taking differently: Take 40 mg by mouth 3 (three) times daily. 12/24/21 06/09/22  Nori Riis, NP  gabapentin (NEURONTIN) 300 MG capsule Take 1 capsule (300 mg total) by mouth 3 (three) times daily. 05/05/20   Tat, Octaviano Batty, DO  metolazone (ZAROXOLYN) 2.5 MG tablet TAKE 1 TABLET (2.5 MG TOTAL) BY MOUTH AS DIRECTED TWICE A WEEK 10/13/21   Patwardhan, Manish J, MD  Multiple Vitamins-Minerals (PRESERVISION AREDS PO) Take 1 tablet by mouth daily.    [provider]  nitroGLYCERIN (NITROSTAT) 0.4 MG SL tablet TAKE 1 TABLET EVERY 5 MINUTES AS NEEDED FOR CHEST PAIN Patient not taking: Reported on 06/09/2022 08/18/21   Yates Decamp, MD  polyethylene glycol (MIRALAX / Ethelene Hal) packet Take 17 g by mouth 2 (two) times daily. Patient taking differently: Take  17 g by mouth daily as needed. 09/29/17   Kathlen Mody, MD  potassium chloride (KLOR-CON) 10 MEQ tablet Take 2 tablets (20 mEq total) by mouth 2 (two) times daily. Patient taking differently: Take 40 mEq by mouth 3 (three) times daily. 10/26/20   Patwardhan, Anabel Bene, MD  rosuvastatin (CRESTOR) 10 MG tablet Take 10 mg by mouth daily at 8 pm. (2000)    [provider]    Physical Exam: Vitals:   06/21/22 0523 06/21/22 0524 06/21/22 0527  BP: 131/70    Pulse: 82    Resp:   20  Temp: (!) 97.5 F (36.4 C)    SpO2: 92%  100%  Weight:  90 kg   Height:   (1.803 m)     Constitutional: Elderly male who appears to be in no acute distress at this time. Eyes: PERRL, lids and conjunctivae normal ENMT: Mucous membranes are moist. Posterior pharynx clear of any exudate or lesions.Normal dentition.  Neck: normal, supple, no masses, no thyromegaly Respiratory: clear to auscultation bilaterally, no wheezing, no crackles. Normal respiratory effort. No accessory muscle use.  Cardiovascular: Regular rate and rhythm, no murmurs / rubs / gallops. No extremity edema. 2+ pedal pulses. No carotid bruits.  Abdomen: no tenderness, no masses palpated. No hepatosplenomegaly. Bowel sounds positive.  Musculoskeletal: no clubbing / cyanosis. No joint deformity upper and lower extremities. Good ROM, no contractures. Normal muscle tone.  Skin: Abrasions and bruising noted on the right elbow.  Venous stasis changes of the bilateral lower extremity with abrasions appreciated lower extremities.  Neurologic: CN 2-12 grossly intact. Sensation intact, DTR normal. Strength 5/5 in all 4.  Psychiatric: Normal judgment and insight. Alert and oriented x 3. Normal mood.   Data Reviewed:  Reviewed labs, imaging, and pertinent records as noted above in HPI  Assessment and Plan:  Fall versus syncope Patient had been down outside on the concrete outside of his house.  He sustained abrasions to the right side of his  body.  CT brain was negative.  From history given patient possibly balance while trying to go outside which led to the fall, but unclear if patient possibly had a syncopal event. -Admit to a telemetry bed -Follow-up CK -Check orthostatic vital signs -PT to evaluate and treat -Follow-up telemetry overnight  Acute kidney injury superimposed on chronic kidney disease stage III   Patient presents with creatinine elevated up to 2.11 with BUN 45.  Baseline creatinine noted to be around 1.6.  Elevated BUN to creatinine ratio is concerning for prerenal  cause of symptoms. -Follow-up CK -Check urinalysis  -Hold nephrotoxic agents -Recheck kidney function tomorrow morning  Elevated troponin High-sensitivity troponins essentially flat and 20.  Patient without complaints of chest pain. -Continue to monitor  Permanent atrial fibrillation on chronic anticoagulation CHA2DS2-VASc score = 3. -Continue Cardizem and Eliquis  Heart failure with preserved EF Chronic.  BNP mildly elevated at 216.6. Last EF noted to be 61% with moderate aortic regurgitation, mitral regurgitation mild to moderate tricuspid regurgitationin 11/2021. -Strict I&Os and daily weights -Resume furosemide and metolazone  Parkinson's disease Gait disturbance At baseline patient uses a walker to ambulate -Continue Sinemet  Lymphedema Chronic venous insufficiency Patient's wife makes note that his pneumatic compression devices were causing tears on his skin.  The representative from the company supposed to come out and evaluate them tomorrow. -Elevate lower extremities  Hyperlipidemia -Continue statin  VTE prophylaxis: Eliquis Advance Care Planning:   Code Status: Full Code    Consults: none  Family Communication: Wife updated at bedside  Severity of Illness: The appropriate patient status for this patient is OBSERVATION. Observation status is judged to be reasonable and necessary in order to provide the required  intensity of service to ensure the patient's safety. The patient's presenting symptoms, physical exam findings, and initial radiographic and laboratory data in the context of their medical condition is felt to place them at decreased risk for further clinical deterioration. Furthermore, it is anticipated that the patient will be medically stable for discharge from the hospital within 2 midnights of admission.   Author: Clydie Braun, MD 06/21/2022 9:14 AM  For on call review [email or web address].

## 2022-06-21 NOTE — ED Notes (Signed)
Patient transported to x-ray. ?

## 2022-06-22 DIAGNOSIS — R296 Repeated falls: Secondary | ICD-10-CM | POA: Diagnosis not present

## 2022-06-22 DIAGNOSIS — Y92009 Unspecified place in unspecified non-institutional (private) residence as the place of occurrence of the external cause: Secondary | ICD-10-CM

## 2022-06-22 DIAGNOSIS — W19XXXA Unspecified fall, initial encounter: Secondary | ICD-10-CM | POA: Diagnosis not present

## 2022-06-22 DIAGNOSIS — Z7901 Long term (current) use of anticoagulants: Secondary | ICD-10-CM | POA: Diagnosis not present

## 2022-06-22 DIAGNOSIS — G20A1 Parkinson's disease without dyskinesia, without mention of fluctuations: Secondary | ICD-10-CM | POA: Diagnosis not present

## 2022-06-22 DIAGNOSIS — I5032 Chronic diastolic (congestive) heart failure: Secondary | ICD-10-CM | POA: Diagnosis not present

## 2022-06-22 LAB — URINALYSIS, ROUTINE W REFLEX MICROSCOPIC
Bilirubin Urine: NEGATIVE
Glucose, UA: 50 mg/dL — AB
Hgb urine dipstick: NEGATIVE
Ketones, ur: NEGATIVE mg/dL
Leukocytes,Ua: NEGATIVE
Nitrite: NEGATIVE
Protein, ur: 30 mg/dL — AB
Specific Gravity, Urine: 1.011 (ref 1.005–1.030)
pH: 6 (ref 5.0–8.0)

## 2022-06-22 LAB — CBC
HCT: 36.2 % — ABNORMAL LOW (ref 39.0–52.0)
Hemoglobin: 11.8 g/dL — ABNORMAL LOW (ref 13.0–17.0)
MCH: 29.2 pg (ref 26.0–34.0)
MCHC: 32.6 g/dL (ref 30.0–36.0)
MCV: 89.6 fL (ref 80.0–100.0)
Platelets: 197 10*3/uL (ref 150–400)
RBC: 4.04 MIL/uL — ABNORMAL LOW (ref 4.22–5.81)
RDW: 16.5 % — ABNORMAL HIGH (ref 11.5–15.5)
WBC: 5.2 10*3/uL (ref 4.0–10.5)
nRBC: 0 % (ref 0.0–0.2)

## 2022-06-22 LAB — BASIC METABOLIC PANEL
Anion gap: 13 (ref 5–15)
BUN: 46 mg/dL — ABNORMAL HIGH (ref 8–23)
CO2: 30 mmol/L (ref 22–32)
Calcium: 8.7 mg/dL — ABNORMAL LOW (ref 8.9–10.3)
Chloride: 92 mmol/L — ABNORMAL LOW (ref 98–111)
Creatinine, Ser: 2.07 mg/dL — ABNORMAL HIGH (ref 0.61–1.24)
GFR, Estimated: 31 mL/min — ABNORMAL LOW (ref >60)
Glucose, Bld: 110 mg/dL — ABNORMAL HIGH (ref 70–99)
Potassium: 2.6 mmol/L — CL (ref 3.5–5.1)
Sodium: 135 mmol/L (ref 135–145)

## 2022-06-22 LAB — POTASSIUM: Potassium: 3.3 mmol/L — ABNORMAL LOW (ref 3.5–5.1)

## 2022-06-22 MED ORDER — POTASSIUM CHLORIDE CRYS ER 20 MEQ PO TBCR
40.0000 meq | EXTENDED_RELEASE_TABLET | Freq: Three times a day (TID) | ORAL | Status: DC
  Administered 2022-06-22: 40 meq via ORAL
  Filled 2022-06-22: qty 2

## 2022-06-22 MED ORDER — POTASSIUM CHLORIDE CRYS ER 20 MEQ PO TBCR
40.0000 meq | EXTENDED_RELEASE_TABLET | Freq: Two times a day (BID) | ORAL | Status: DC
  Administered 2022-06-22: 40 meq via ORAL
  Filled 2022-06-22: qty 2

## 2022-06-22 MED ORDER — POTASSIUM CHLORIDE CRYS ER 20 MEQ PO TBCR
40.0000 meq | EXTENDED_RELEASE_TABLET | Freq: Once | ORAL | Status: AC
  Administered 2022-06-22: 40 meq via ORAL
  Filled 2022-06-22: qty 2

## 2022-06-22 NOTE — Progress Notes (Signed)
IV team consulted for IV placement. Patient receiving no IV medications. Before RN attempted to place IV, MD arrived to bedside and said no PIV necessary. Primary RN notified.

## 2022-06-22 NOTE — TOC Transition Note (Addendum)
Transition of Care [facility]) - CM/SW Discharge Note   Patient Details  Name: [name] MRN: [id number] Date of Birth: 07-17-1935  Transition of Care [facility]) CM/SW Contact:  Leone Haven, RN Phone Number: 06/22/2022, 3:02 PM   Clinical Narrative:    Patient is for dc today, NCM offered choice , wife at the bedside chose [facility].  NCM made referral to [facility] with [name], he is able to take referral.  Soc will begin 24 to 48 hrs post dc.  Wife will transport patient home at dc.  Patient has  a walker x 2 at home.  Wife states he also goes to the wound clinic at [facility] and has apt for Friday.  Wife was also stating she will be looking into some private duty aide care for patient.   Final next level of care: Home w Home Health Services Barriers to Discharge: No Barriers Identified   Patient Goals and CMS Choice CMS Medicare.gov Compare Post Acute Care list provided to:: Patient Choice offered to / list presented to : Spouse  Discharge Placement                         Discharge Plan and Services Additional resources added to the After Visit Summary for                  DME Arranged: N/A DME Agency: NA       HH Arranged: RN, Disease Management, PT, Nurse's Aide   Date HH Agency Contacted: 06/22/22 Time HH Agency Contacted: 1502 Representative spoke with at [facility] Agency: Kandee Keen  Social Determinants of Health (SDOH) Interventions SDOH Screenings   Food Insecurity: No Food Insecurity (06/22/2022)  Housing: Low Risk  (06/22/2022)  Transportation Needs: No Transportation Needs (06/22/2022)  Utilities: Not At Risk (06/22/2022)  Tobacco Use: Low Risk  (06/21/2022)     Readmission Risk Interventions    05/22/2020   11:48 AM  Readmission Risk Prevention Plan  Transportation Screening Complete  PCP or Specialist Appt within 5-7 Days Complete  Home Care Screening Complete  Medication Review (RN CM) Complete

## 2022-06-22 NOTE — Plan of Care (Signed)
  Problem: Education: Goal: Knowledge of General Education information will improve Description: Including pain rating scale, medication(s)/side effects and non-pharmacologic comfort measures 06/22/2022 1546 by [name], RN Outcome: Adequate for Discharge 06/22/2022 1328 by [name], RN Outcome: Progressing   Problem: Health Behavior/Discharge Planning: Goal: Ability to manage health-related needs will improve 06/22/2022 1546 by [name], RN Outcome: Adequate for Discharge 06/22/2022 1328 by [name], RN Outcome: Progressing   Problem: Clinical Measurements: Goal: Ability to maintain clinical measurements within normal limits will improve 06/22/2022 1546 by [name], RN Outcome: Adequate for Discharge 06/22/2022 1328 by [name], RN Outcome: Progressing Goal: Will remain free from infection 06/22/2022 1546 by [name], RN Outcome: Adequate for Discharge 06/22/2022 1328 by [name], RN Outcome: Progressing Goal: Diagnostic test results will improve 06/22/2022 1546 by [name], RN Outcome: Adequate for Discharge 06/22/2022 1328 by [name], RN Outcome: Progressing Goal: Respiratory complications will improve 06/22/2022 1546 by [name], RN Outcome: Adequate for Discharge 06/22/2022 1328 by [name], RN Outcome: Progressing Goal: Cardiovascular complication will be avoided 06/22/2022 1546 by [name], RN Outcome: Adequate for Discharge 06/22/2022 1328 by [name], RN Outcome: Progressing   Problem: Activity: Goal: Risk for activity intolerance will decrease 06/22/2022 1546 by [name], RN Outcome: Adequate for Discharge 06/22/2022 1328 by [name], RN Outcome: Progressing   Problem: Nutrition: Goal: Adequate nutrition will be maintained 06/22/2022 1546 by [name], RN Outcome: Adequate for Discharge 06/22/2022 1328 by [name], RN Outcome:  Progressing   Problem: Coping: Goal: Level of anxiety will decrease 06/22/2022 1546 by [name], RN Outcome: Adequate for Discharge 06/22/2022 1328 by [name], RN Outcome: Progressing   Problem: Elimination: Goal: Will not experience complications related to bowel motility 06/22/2022 1546 by [name], RN Outcome: Adequate for Discharge 06/22/2022 1328 by [name], RN Outcome: Progressing Goal: Will not experience complications related to urinary retention 06/22/2022 1546 by [name], RN Outcome: Adequate for Discharge 06/22/2022 1328 by [name], RN Outcome: Progressing   Problem: Pain Managment: Goal: General experience of comfort will improve 06/22/2022 1546 by [name], RN Outcome: Adequate for Discharge 06/22/2022 1328 by [name], RN Outcome: Progressing   Problem: Safety: Goal: Ability to remain free from injury will improve 06/22/2022 1546 by [name], RN Outcome: Adequate for Discharge 06/22/2022 1328 by [name], RN Outcome: Progressing   Problem: Skin Integrity: Goal: Risk for impaired skin integrity will decrease 06/22/2022 1546 by [name], RN Outcome: Adequate for Discharge 06/22/2022 1328 by [name], RN Outcome: Progressing

## 2022-06-22 NOTE — Plan of Care (Signed)

## 2022-06-22 NOTE — Progress Notes (Signed)
Mobility Specialist Progress Note:   06/22/22 1200  Orthostatic Lying   BP- Lying 119/62  Orthostatic Sitting  BP- Sitting 119/71  Orthostatic Standing at 0 minutes  BP- Standing at 0 minutes 105/85  Orthostatic Standing at 3 minutes  BP- Standing at 3 minutes  (pt unable)  Mobility  Activity Stood at bedside;Dangled on edge of bed  Level of Assistance Minimal assist, patient does 75% or more  Assistive Device Front wheel walker  Activity Response Tolerated well  Mobility Referral Yes  $Mobility charge 1 Mobility   Pt agreeable to mobility session. Required heavy minA to stand EOB, and to maintain standing with heavy reliance on BUE. Pt unable to maintain standing for BP, and unable to take steps to chair d/t knee buckling. Left sitting EOB eating lunch, with significant other present.   Addison Lank Mobility Specialist Please contact via SecureChat or  Rehab office at [phone]

## 2022-06-22 NOTE — ED Notes (Signed)
Pt report received from previous nurse. Pt A&O x4, vitals stable, denies needs/complaints. Call bell in reach. No acute distress noted.  

## 2022-06-22 NOTE — ED Notes (Signed)
ED TO INPATIENT HANDOFF REPORT  ED Nurse Name and Phone #: [phone]  S Name/Age/Gender [name] 87 y.o. male Room/Bed: 040C/040C  Code Status   Code Status: Full Code  Home/SNF/Other Home Patient oriented to: self, place, time, and situation Is this baseline? Yes   Triage Complete: Triage complete  Chief Complaint Syncope [R55]  Triage Note Pt to ED by EMS from home following an unwitnessed fall. Pt was sitting in his recliner when his wife last saw him, when she got up he was lying in the front yard. Pt denies any LOC, however he does not remember the incident. Abrasions noted to both elbows and an abrasion to his LLE. Arrives A+O, VSS, NADN, pt denies ant complaints at this time. Pt is taking eliquis. Pt is a kidney pt that presents with pitting edema to both LE's.   Allergies Allergies  Allergen Reactions   Nsaids Other (See Comments)    Cannot have any of these because he is currently taking Eliquis    Level of Care/Admitting Diagnosis ED Disposition     ED Disposition  Admit   Condition  --   Comment  Hospital Area: [facility] [[id number]]  Level of Care: Telemetry Cardiac [103]  May place patient in observation at [facility] or [facility] if equivalent level of care is available:: No  Covid Evaluation: Asymptomatic - no recent exposure (last 10 days) testing not required  Diagnosis: Syncope [206001]  Admitting Physician: Clydie Braun [[id number]]  Attending Physician: Clydie Braun [[id number]]          B Medical/Surgery History Past Medical History:  Diagnosis Date   Arthritis    "knees; lower back" (08/09/2016)   Atrial fibrillation    remote hx/notes 08/07/2016   Coronary artery disease    Episodes of trembling    "most of the time it's my left hand" (08/09/2016)   GI bleeding 08/2017   High cholesterol    History of kidney stones    Hypertension    Melanoma of shoulder 2000s   "left"   Mitral regurgitation  and mitral stenosis    hx/notes 08/07/2016   Moderate aortic stenosis    hx/notes 08/07/2016   Parkinson's disease    Prostate cancer 2011   hx/notes 08/07/2016   Past Surgical History:  Procedure Laterality Date   CARDIAC CATHETERIZATION  ~ [address], [place]"   CARDIOVERSION N/A 01/26/2017   Procedure: CARDIOVERSION;  Surgeon: Elder Negus, MD;  Location: [facility] ENDOSCOPY;  Service: Cardiovascular;  Laterality: N/A;   CARDIOVERSION N/A 03/06/2018   Procedure: CARDIOVERSION;  Surgeon: Elder Negus, MD;  Location: [facility] ENDOSCOPY;  Service: Cardiovascular;  Laterality: N/A;   CATARACT EXTRACTION W/ INTRAOCULAR LENS  IMPLANT, BILATERAL Bilateral 2014-02/2016   left-right; hx/notes 08/07/2016   COLONOSCOPY WITH PROPOFOL N/A 09/25/2017   Procedure: COLONOSCOPY WITH PROPOFOL;  Surgeon: Carman Ching, MD;  Location: [facility] ENDOSCOPY;  Service: Endoscopy;  Laterality: N/A;   CORONARY ANGIOPLASTY WITH STENT PLACEMENT  08/09/2016   CORONARY ATHERECTOMY N/A 08/09/2016   Procedure: Coronary Atherectomy;  Surgeon: Yates Decamp, MD;  Location: [facility] INVASIVE CV LAB;  Service: Cardiovascular;  Laterality: N/A;   CORONARY PRESSURE/FFR STUDY N/A 08/09/2016   Procedure: Intravascular Pressure Wire/FFR Study;  Surgeon: Yates Decamp, MD;  Location: [facility] INVASIVE CV LAB;  Service: Cardiovascular;  Laterality: N/A;   CORONARY STENT INTERVENTION N/A 08/09/2016   Procedure: Coronary Stent Intervention;  Surgeon: Yates Decamp, MD;  Location: [facility] INVASIVE CV  LAB;  Service: Cardiovascular;  Laterality: N/A;  LAD   EYE SURGERY Bilateral 05/2016   "laser on both lenses"   FEMUR IM NAIL Left 05/20/2020   Procedure: INTRAMEDULLARY (IM) NAIL FEMORAL;  Surgeon: Samson Frederic, MD;  Location: [facility] ORS;  Service: Orthopedics;  Laterality: Left;   HEMORRHOID SURGERY N/A 09/27/2017   Procedure: HEMORRHOIDECTOMY;  Surgeon: Harriette Bouillon, MD;  Location: [facility] OR;  Service: General;  Laterality: N/A;   INGUINAL HERNIA REPAIR Left  1991   INSERTION PROSTATE RADIATION SEED  06/25/2009   LAPAROSCOPIC CHOLECYSTECTOMY  1993   MELANOMA EXCISION Left 1990s   "shoulder"   MELANOMA EXCISION Right 08/2018   right shoulder   MELANOMA EXCISION  04/20/2021   RIGHT HEART CATH N/A 03/20/2018   Procedure: RIGHT HEART CATH;  Surgeon: Elder Negus, MD;  Location: [facility] INVASIVE CV LAB;  Service: Cardiovascular;  Laterality: N/A;   RIGHT/LEFT HEART CATH AND CORONARY ANGIOGRAPHY N/A 08/09/2016   Procedure: Right/Left Heart Cath and Coronary Angiography;  Surgeon: Yates Decamp, MD;  Location: [facility] INVASIVE CV LAB;  Service: Cardiovascular;  Laterality: N/A;   SURGERY FOR BROKEN FEMUR Left 05/24/2020   TEE WITHOUT CARDIOVERSION N/A 01/25/2017   Procedure: TRANSESOPHAGEAL ECHOCARDIOGRAM (TEE);  Surgeon: Elder Negus, MD;  Location: [facility] ENDOSCOPY;  Service: Cardiovascular;  Laterality: N/A;     A IV Location/Drains/Wounds Patient Lines/Drains/Airways Status     Active Line/Drains/Airways     None            Intake/Output Last 24 hours No intake or output data in the 24 hours ending 06/22/22 0541  Labs/Imaging Results for orders placed or performed during the hospital encounter of 06/21/22 (from the past 48 hour(s))  CBC with Differential     Status: Abnormal   Collection Time: 06/21/22  6:05 AM  Result Value Ref Range   WBC 8.4 4.0 - 10.5 K/uL   RBC 4.59 4.22 - 5.81 MIL/uL   Hemoglobin 13.4 13.0 - 17.0 g/dL   HCT 16.1 09.6 - 04.5 %   MCV 89.3 80.0 - 100.0 fL   MCH 29.2 26.0 - 34.0 pg   MCHC 32.7 30.0 - 36.0 g/dL   RDW 40.9 (H) 81.1 - 91.4 %   Platelets 242 150 - 400 K/uL   nRBC 0.0 0.0 - 0.2 %   Neutrophils Relative % 84 %   Neutro Abs 7.1 1.7 - 7.7 K/uL   Lymphocytes Relative 8 %   Lymphs Abs 0.7 0.7 - 4.0 K/uL   Monocytes Relative 5 %   Monocytes Absolute 0.5 0.1 - 1.0 K/uL   Eosinophils Relative 1 %   Eosinophils Absolute 0.1 0.0 - 0.5 K/uL   Basophils Relative 1 %   Basophils Absolute 0.1 0.0 -  0.1 K/uL   Immature Granulocytes 1 %   Abs Immature Granulocytes 0.04 0.00 - 0.07 K/uL    Comment: Performed at [facility], [address], [place], [place] [address]  Comprehensive metabolic panel     Status: Abnormal   Collection Time: 06/21/22  6:05 AM  Result Value Ref Range   Sodium 138 135 - 145 mmol/L   Potassium 3.5 3.5 - 5.1 mmol/L   Chloride 91 (L) 98 - 111 mmol/L   CO2 31 22 - 32 mmol/L   Glucose, Bld 102 (H) 70 - 99 mg/dL    Comment: Glucose reference range applies only to samples taken after fasting for at least 8 hours.   BUN 45 (H) 8 - 23  mg/dL   Creatinine, Ser 8.29 (H) 0.61 - 1.24 mg/dL   Calcium 9.4 8.9 - 56.2 mg/dL   Total Protein 7.9 6.5 - 8.1 g/dL   Albumin 3.6 3.5 - 5.0 g/dL   AST 17 15 - 41 U/L   ALT 5 0 - 44 U/L   Alkaline Phosphatase 140 (H) 38 - 126 U/L   Total Bilirubin 2.5 (H) 0.3 - 1.2 mg/dL   GFR, Estimated 30 (L) >60 mL/min    Comment: (NOTE) Calculated using the CKD-EPI Creatinine Equation (2021)    Anion gap 16 (H) 5 - 15    Comment: Performed at [facility], [address], [place], [place] [address]  Brain natriuretic peptide     Status: Abnormal   Collection Time: 06/21/22  6:05 AM  Result Value Ref Range   B Natriuretic Peptide 216.6 (H) 0.0 - 100.0 pg/mL    Comment: Performed at [facility], [address] [place], [place] [address]  Troponin I (High Sensitivity)     Status: Abnormal   Collection Time: 06/21/22  6:05 AM  Result Value Ref Range   Troponin I (High Sensitivity) 19 (H) <18 ng/L    Comment: (NOTE) Elevated high sensitivity troponin I (hsTnI) values and significant  changes across serial measurements may suggest ACS but many other  chronic and acute conditions are known to elevate hsTnI results.  Refer to the "Links" section for chest pain algorithms and additional  guidance. Performed at [facility], [address], [place], [place] [address]   Magnesium     Status: None   Collection Time:  06/21/22  6:05 AM  Result Value Ref Range   Magnesium 2.2 1.7 - 2.4 mg/dL    Comment: Performed at [facility], [address], [place], [place] [address]  Troponin I (High Sensitivity)     Status: Abnormal   Collection Time: 06/21/22  7:35 AM  Result Value Ref Range   Troponin I (High Sensitivity) 20 (H) <18 ng/L    Comment: (NOTE) Elevated high sensitivity troponin I (hsTnI) values and significant  changes across serial measurements may suggest ACS but many other  chronic and acute conditions are known to elevate hsTnI results.  Refer to the "Links" section for chest pain algorithms and additional  guidance. Performed at [facility], [address] [place], [place] [address]   Urinalysis, Routine w reflex microscopic -Urine, Clean Catch     Status: Abnormal   Collection Time: 06/22/22 12:27 AM  Result Value Ref Range   Color, Urine YELLOW YELLOW   APPearance CLEAR CLEAR   Specific Gravity, Urine 1.011 1.005 - 1.030   pH 6.0 5.0 - 8.0   Glucose, UA 50 (A) NEGATIVE mg/dL   Hgb urine dipstick NEGATIVE NEGATIVE   Bilirubin Urine NEGATIVE NEGATIVE   Ketones, ur NEGATIVE NEGATIVE mg/dL   Protein, ur 30 (A) NEGATIVE mg/dL   Nitrite NEGATIVE NEGATIVE   Leukocytes,Ua NEGATIVE NEGATIVE   RBC / HPF 0-5 0 - 5 RBC/hpf   WBC, UA 0-5 0 - 5 WBC/hpf   Bacteria, UA RARE (A) NONE SEEN   Squamous Epithelial / HPF 0-5 0 - 5 /HPF   Mucus PRESENT     Comment: Performed at [facility], [address] [place], [place] [address]  CBC     Status: Abnormal   Collection Time: 06/22/22  1:35 AM  Result Value Ref Range   WBC 5.2 4.0 -  10.5 K/uL   RBC 4.04 (L) 4.22 - 5.81 MIL/uL   Hemoglobin 11.8 (L) 13.0 - 17.0 g/dL   HCT 82.9 (L) 56.2 - 13.0 %   MCV 89.6 80.0 - 100.0 fL   MCH 29.2 26.0 - 34.0 pg   MCHC 32.6 30.0 - 36.0 g/dL   RDW 86.5 (H) 78.4 - 69.6 %   Platelets 197 150 - 400 K/uL   nRBC 0.0 0.0 - 0.2 %    Comment: Performed at [facility], [address],  [place], [place] [address]  Basic metabolic panel     Status: Abnormal   Collection Time: 06/22/22  1:35 AM  Result Value Ref Range   Sodium 135 135 - 145 mmol/L   Potassium 2.6 (LL) 3.5 - 5.1 mmol/L    Comment: CRITICAL RESULT CALLED TO, READ BACK BY AND VERIFIED WITH Anner Crete, RN. [phone] 06/22/22. LPAIT   Chloride 92 (L) 98 - 111 mmol/L   CO2 30 22 - 32 mmol/L   Glucose, Bld 110 (H) 70 - 99 mg/dL    Comment: Glucose reference range applies only to samples taken after fasting for at least 8 hours.   BUN 46 (H) 8 - 23 mg/dL   Creatinine, Ser 4.40 (H) 0.61 - 1.24 mg/dL   Calcium 8.7 (L) 8.9 - 10.3 mg/dL   GFR, Estimated 31 (L) >60 mL/min    Comment: (NOTE) Calculated using the CKD-EPI Creatinine Equation (2021)    Anion gap 13 5 - 15    Comment: Performed at [facility], [address] [place], [place] [address]   CT Head Wo Contrast  Result Date: 06/21/2022 CLINICAL DATA:  Unwitnessed fall. EXAM: CT HEAD WITHOUT CONTRAST TECHNIQUE: Contiguous axial images were obtained from the base of the skull through the vertex without intravenous contrast. RADIATION DOSE REDUCTION: This exam was performed according to the departmental dose-optimization program which includes automated exposure control, adjustment of the mA and/or kV according to patient size and/or use of iterative reconstruction technique. COMPARISON:  None Available. FINDINGS: Brain: There is no evidence for acute hemorrhage, hydrocephalus, mass lesion, or abnormal extra-axial fluid collection. No definite CT evidence for acute infarction. Diffuse loss of parenchymal volume is consistent with atrophy. Patchy low attenuation in the deep hemispheric and periventricular white matter is nonspecific, but likely reflects chronic microvascular ischemic demyelination. Vascular: No hyperdense vessel or unexpected calcification. Skull: No evidence for fracture. No worrisome lytic or sclerotic lesion. Sinuses/Orbits: The visualized paranasal  sinuses and mastoid air cells are clear. Visualized portions of the globes and intraorbital fat are unremarkable. Other: None. IMPRESSION: 1. No acute intracranial abnormality. 2. Atrophy with chronic small vessel ischemic disease. Electronically Signed   By: Kennith Center M.D.   On: 06/21/2022 06:15   DG Chest 2 View  Result Date: 06/21/2022 CLINICAL DATA:  Fall. EXAM: CHEST - 2 VIEW COMPARISON:  06/19/2020 FINDINGS: Low volume chest with asymmetric right diaphragm elevation and overlying scar. Cardiomegaly with left-sided coronary stenting. No edema, effusion, or pneumothorax. Atherosclerosis. IMPRESSION: 1. Chronic low volume chest with asymmetric right diaphragm elevation and mild atelectasis/scarring. 2. Cardiomegaly without pulmonary edema. 3. No significant change since 2022. Electronically Signed   By: Tiburcio Pea M.D.   On: 06/21/2022 05:57    Pending Labs Unresulted Labs (From admission, onward)     Start     Ordered   06/21/22 0909  CK  Once,   URGENT        06/21/22 0908  Vitals/Pain Today's Vitals   06/22/22 0400 06/22/22 0500 06/22/22 0536 06/22/22 0540  BP: (!) 117/57  119/69   Pulse: 65   88  Resp:  19    Temp:    97.7 F (36.5 C)  TempSrc:    Oral  SpO2: 95%   92%  Weight:      Height:      PainSc:        Isolation Precautions No active isolations  Medications Medications  diltiazem (CARDIZEM CD) 24 hr capsule 120 mg (120 mg Oral Given 06/21/22 1249)  rosuvastatin (CRESTOR) tablet 10 mg (10 mg Oral Given 06/21/22 2006)  apixaban (ELIQUIS) tablet 2.5 mg (2.5 mg Oral Given 06/21/22 2201)  sodium chloride flush (NS) 0.9 % injection 3 mL (3 mLs Intravenous Not Given 06/21/22 2244)  acetaminophen (TYLENOL) tablet 650 mg (has no administration in time range)    Or  acetaminophen (TYLENOL) suppository 650 mg (has no administration in time range)  albuterol (PROVENTIL) (2.5 MG/3ML) 0.083% nebulizer solution 2.5 mg (has no administration in time range)   carbidopa-levodopa (SINEMET IR) 25-100 MG per tablet immediate release 2 tablet (2 tablets Oral Given 06/21/22 1617)  furosemide (LASIX) tablet 80 mg (80 mg Oral Given 06/21/22 2201)  metolazone (ZAROXOLYN) tablet 2.5 mg (has no administration in time range)  potassium chloride SA (KLOR-CON M) CR tablet 40 mEq (40 mEq Oral Given 06/22/22 0540)    Mobility walks     Focused Assessments Cardiac Assessment Handoff:    Lab Results  Component Value Date   TROPONINI 0.04 (HH) 04/12/2018   No results found for: "DDIMER" Does the Patient currently have chest pain? No   , Neuro Assessment Handoff:  Swallow screen pass? Yes          Neuro Assessment:   Neuro Checks:      Has TPA been given? No If patient is a Neuro Trauma and patient is going to OR before floor call report to 4N Charge nurse: [phone] or [phone]  , Pulmonary Assessment Handoff:  Lung sounds:   O2 Device: Room Air      R Recommendations: See Admitting Provider Note  Report given to:   Additional Notes: n/a

## 2022-06-22 NOTE — Evaluation (Signed)
Physical Therapy Evaluation Patient Details Name: Mohanad Carsten MRN: [id number] DOB: 08/16/1935 Today's Date: 06/22/2022  History of Present Illness  87 year old male admitted on 06/21/22 for an unwitnessed fall at home. Past medical history significant of hypertension, dyslipidemia, persistent atrial fibrillation on Eliquis, CAD, Parkinson's disease, and lymphedema  Clinical Impression  Pt presents with admitting diagnosis above. Per H&P, pt wife reports some memory deficits at baseline however she was not present for today's assessment. Pt states at baseline he sleeps in a lift chair, ambulates short distances with RW, and uses WC out in community. Today, pt was able to ambulate short distance with RW requiring Min A for all mobility. Pt ambulated with a crouched gait and had one instance of knee buckling however pt states that this is his baseline. Given that pt is likely close to his baseline, recommend HHPT upon DC. Pt would benefit from continued mobility with mobility specialist during acute stay. PT will continue to follow.     Recommendations for follow up therapy are one component of a multi-disciplinary discharge planning process, led by the attending physician.  Recommendations may be updated based on patient status, additional functional criteria and insurance authorization.  Follow Up Recommendations       Assistance Recommended at Discharge Intermittent Supervision/Assistance  Patient can return home with the following  A little help with walking and/or transfers;A little help with bathing/dressing/bathroom;Assistance with cooking/housework;Direct supervision/assist for medications management;Assist for transportation;Help with stairs or ramp for entrance    Equipment Recommendations None recommended by PT (Pt has needed DME)  Recommendations for Other Services  OT consult    Functional Status Assessment Patient has had a recent decline in their functional status and  demonstrates the ability to make significant improvements in function in a reasonable and predictable amount of time.     Precautions / Restrictions Precautions Precautions: Fall Restrictions Weight Bearing Restrictions: No      Mobility  Bed Mobility Overal bed mobility: Needs Assistance Bed Mobility: Supine to Sit, Sit to Supine     Supine to sit: Min assist Sit to supine: Min assist   General bed mobility comments: Pt required asssistance with BLE management.    Transfers Overall transfer level: Needs assistance Equipment used: Rolling walker (2 wheels) Transfers: Sit to/from Stand Sit to Stand: Min assist           General transfer comment: Cues for hand placement    Ambulation/Gait Ambulation/Gait assistance: Min assist Gait Distance (Feet): 20 Feet Assistive device: Rolling walker (2 wheels) Gait Pattern/deviations: Knee flexed in stance - right, Knee flexed in stance - left, Knees buckling, Decreased stride length, Step-through pattern, Trunk flexed, Narrow base of support Gait velocity: decreased     General Gait Details: Cues for proximity and safety with RW. Pt walks with crouched gait and has parkinsons at baseline.  Stairs            Wheelchair Mobility    Modified Rankin (Stroke Patients Only)       Balance Overall balance assessment: Needs assistance Sitting-balance support: Bilateral upper extremity supported, Feet supported Sitting balance-Leahy Scale: Fair Sitting balance - Comments: Pt needed cues for upright posture Postural control: Right lateral lean Standing balance support: Bilateral upper extremity supported, Reliant on assistive device for balance, During functional activity Standing balance-Leahy Scale: Poor Standing balance comment: Reliant on RW  Pertinent Vitals/Pain Pain Assessment Pain Assessment: No/denies pain    Home Living Family/patient expects to be discharged to::  Private residence Living Arrangements: Spouse/significant other Available Help at Discharge: Family;Available 24 hours/day Type of Home: House Home Access: Stairs to enter Entrance Stairs-Rails: None Entrance Stairs-Number of Steps: 1   Home Layout: One level Home Equipment: Teacher, English as a foreign language (2 wheels);Wheelchair - manual;Grab bars - toilet;Cane - quad;Cane - single point;Hand held shower head Additional Comments: Pt states that he sleeps in a lift chair at baseline    Prior Function Prior Level of Function : Needs assist;History of Falls (last six months)             Mobility Comments: Pt reports ambulating short distances around house with RW and using WC out in community ADLs Comments: Pt reports Ind     Hand Dominance   Dominant Hand: Right    Extremity/Trunk Assessment   Upper Extremity Assessment Upper Extremity Assessment: Overall WFL for tasks assessed    Lower Extremity Assessment Lower Extremity Assessment: Generalized weakness    Cervical / Trunk Assessment Cervical / Trunk Assessment: Kyphotic  Communication   Communication: No difficulties  Cognition Arousal/Alertness: Awake/alert Behavior During Therapy: WFL for tasks assessed/performed Overall Cognitive Status: No family/caregiver present to determine baseline cognitive functioning                                 General Comments: Slow processing at times        General Comments General comments (skin integrity, edema, etc.): VSS on RA    Exercises     Assessment/Plan    PT Assessment Patient needs continued PT services  PT Problem List Decreased strength;Decreased range of motion;Decreased activity tolerance;Decreased balance;Decreased mobility;Decreased coordination;Decreased knowledge of use of DME;Decreased safety awareness       PT Treatment Interventions DME instruction;Gait training;Stair training;Functional mobility training;Therapeutic activities;Therapeutic  exercise;Balance training;Neuromuscular re-education;Patient/family education    PT Goals (Current goals can be found in the Care Plan section)  Acute Rehab PT Goals Patient Stated Goal: to go home PT Goal Formulation: With patient Time For Goal Achievement: 07/06/22 Potential to Achieve Goals: Fair    Frequency Min 1X/week     Co-evaluation               AM-PAC PT "6 Clicks" Mobility  Outcome Measure Help needed turning from your back to your side while in a flat bed without using bedrails?: A Little Help needed moving from lying on your back to sitting on the side of a flat bed without using bedrails?: A Little Help needed moving to and from a bed to a chair (including a wheelchair)?: A Little Help needed standing up from a chair using your arms (e.g., wheelchair or bedside chair)?: A Little Help needed to walk in hospital room?: A Little Help needed climbing 3-5 steps with a railing? : A Lot 6 Click Score: 17    End of Session Equipment Utilized During Treatment: Gait belt Activity Tolerance: Patient tolerated treatment well Patient left: in bed;with call bell/phone within reach Nurse Communication: Mobility status PT Visit Diagnosis: Other abnormalities of gait and mobility (R26.89);Repeated falls (R29.6)    Time: 6578-4696 PT Time Calculation (min) (ACUTE ONLY): 24 min   Charges:   PT Evaluation $PT Eval Low Complexity: 1 Low PT Treatments $Gait Training: 8-22 mins        Shela Nevin, PT, DPT Acute Rehab Services [phone]  Gladys Damme 06/22/2022, 9:18 AM

## 2022-06-22 NOTE — Consult Note (Addendum)
WOC Nurse Consult Note: Reason for Consult: Consult requested for bilat leg wounds.  Pt is followed by the outpatient wound care center prior to admission and I will continue the present plan of care.  Bilat legs with generalized edema and erythremia. He states he was using lymphedema pumps prior to admission.  Wound type: Left leg full thickness wound to anterior calf; red and moist, 2.5X2X.2cm, mod amt yellow drainage Right anterior calf full thickness wound 4X3X.2cm, 50% dried black scab, 50% red and moist, small amt yellow drainage. Dressing procedure/placement/frequency: Topical treatment orders provided for bedside nurses to perform as follows:Change dressings Q M/W/F as follows: 1. Cut piece of alginate [place] # [phone]) and apply to left leg wound, then cover with foam dressing. Change foam dressing Q 3 days or PRN soiling. Moisten previous dressing to assist with removal. 2. Apply a piece of Xeroform gauze to right leg, then cover with foam dressing. Change foam dressing Q 3 days or PRN soiling. Pt should continue to follow-up with the outpatient wound care center after discharge.  Please re-consult if further assistance is needed.  Thank-you,  Cammie Mcgee MSN, RN, CWOCN, [place], CNS [phone]

## 2022-06-22 NOTE — Discharge Summary (Signed)
Physician Discharge Summary  [name] ZOX:[id number] DOB: 31-Aug-1935 DOA: 06/21/2022  PCP: Merri Brunette, MD  Admit date: 06/21/2022 Discharge date: 06/22/2022  Admitted From: Home Disposition: Home with home health PT, home health aide  Recommendations for Outpatient Follow-up:  Follow up with PCP in 1-2 weeks Please obtain BMP/CBC in one week Continue to follow-up at wound care clinic  Home Health: PT/home health aide/RN Equipment/Devices: Already present at home  Discharge Condition: Fair CODE STATUS: Full code Diet recommendation: Regular diet, nutritional supplements  Discharge summary: 87 year old with history of Parkinson's, hypertension, persistent A-fib on Eliquis, bilateral lymphedema of the legs and ambulatory dysfunction admitted to the hospital due to fall at home.  He lives at home with his partner.  Sleeps in couch.  Last night, he thought somebody came to the door so he walked to the front door, stepped off from the door and was found outside in the concrete falling.  Patient did not lose consciousness.  Denies any dizziness lightheadedness or preceding events.  In the emergency room he was afebrile.  Hemodynamically stable.  Potassium of 2.6.  Skeletal survey negative.  On Eliquis.  Monitored in the hospital.  Mechanical fall, severe frailty and debility: No skeletal injury. Patient with very poor baseline mobility, seen by physical therapy.  Discussed about SNF or long-term nursing home placement and patient and wife do not agree.  They would like to stay home.  She has all the devices needed to take care of him at home. Patient will work with physical therapy at home at this time.  Wife will look for private caretaker support system and arrange more personal care at home.  Severe hypokalemia: Due to use of high-dose of Lasix.  Lasix and metolazone were held.  He was given adequate potassium replacement.  Potassium is 3.3. Patient will continue taking  potassium chloride 40, 3 times daily starting tonight.  He will start taking Lasix and metolazone tomorrow only.  Will benefit with rechecking potassium in 1 week.  Chronic medical issues including Parkinson's, on Sinemet Chronic A-fib, on Eliquis and therapeutic.  Rate controlled.  Oral Cardizem. Chronic lymphedema, she does have lymphedema pump at home.  Patient also has abrasions and leg wounds that he follows up at wound care center.  Will also order home health RN.  Medically stable for discharge.   Discharge Diagnoses:  Principal Problem:   Fall Active Problems:   Acute kidney injury superimposed on chronic kidney disease   Elevated troponin   Chronic anticoagulation   Atrial fibrillation   Chronic heart failure with preserved ejection fraction   Lymphedema   Parkinson's disease    Discharge Instructions  Discharge Instructions     Diet - low sodium heart healthy   Complete by: As directed    Discharge instructions   Complete by: As directed    Start taking your water pill tomorrow only. Take all other medicines today including potassium   Discharge wound care:   Complete by: As directed    Change dressings Q M/W/F as follows: 1. Cut piece of alginate [place] # [phone]) and apply to left leg wound, then cover with foam dressing. Change foam dressing Q 3 days or PRN soiling. Moisten previous dressing to assist with removal. 2. Apply a piece of Xeroform gauze to right leg, then cover with foam dressing. Change foam dressing Q 3 days or PRN soiling.   Increase activity slowly   Complete by: As directed       Allergies  as of 06/22/2022       Reactions   Nsaids Other (See Comments)   Cannot have any of these because he is currently taking Eliquis        Medication List     TAKE these medications    acetaminophen 325 MG tablet Commonly known as: TYLENOL Take 1-2 tablets (325-650 mg total) by mouth every 6 (six) hours as needed for mild pain (pain score 1-3 or  temp > 100.5).   apixaban 2.5 MG Tabs tablet Commonly known as: Eliquis Take 1 tablet (2.5 mg total) by mouth 2 (two) times daily. (0800 & 2000) What changed: additional instructions   carbidopa-levodopa 25-100 MG tablet Commonly known as: SINEMET IR TAKE 2 TABLETS THREE TIMES DAILY AT 7:00AM, 11:00AM AND 4:00PM What changed: See the new instructions.   dapagliflozin propanediol 10 MG Tabs tablet Commonly known as: FARXIGA Take 1 tablet by mouth daily.   diltiazem 120 MG 24 hr capsule Commonly known as: CARDIZEM CD TAKE 1 CAPSULE EVERY DAY   furosemide 40 MG tablet Commonly known as: Lasix Take 1 tablet (40 mg total) by mouth 2 (two) times daily. What changed:  how much to take when to take this   gabapentin 300 MG capsule Commonly known as: NEURONTIN Take 1 capsule (300 mg total) by mouth 3 (three) times daily.   metolazone 2.5 MG tablet Commonly known as: ZAROXOLYN TAKE 1 TABLET (2.5 MG TOTAL) BY MOUTH AS DIRECTED TWICE A WEEK What changed: See the new instructions.   nitroGLYCERIN 0.4 MG SL tablet Commonly known as: NITROSTAT TAKE 1 TABLET EVERY 5 MINUTES AS NEEDED FOR CHEST PAIN What changed:  how much to take how to take this when to take this reasons to take this additional instructions   potassium chloride 10 MEQ tablet Commonly known as: KLOR-CON Take 2 tablets (20 mEq total) by mouth 2 (two) times daily. What changed:  how much to take when to take this   PRESERVISION AREDS PO Take 1 tablet by mouth in the morning and at bedtime.   rosuvastatin 10 MG tablet Commonly known as: CRESTOR Take 10 mg by mouth daily at 8 pm. (2000)   Vitamin D-3 25 MCG (1000 UT) Caps Take 1,000 Units by mouth daily. (0800)               Discharge Care Instructions  (From admission, onward)           Start     Ordered   06/22/22 0000  Discharge wound care:       Comments: Change dressings Q M/W/F as follows: 1. Cut piece of alginate [place] # [phone])  and apply to left leg wound, then cover with foam dressing. Change foam dressing Q 3 days or PRN soiling. Moisten previous dressing to assist with removal. 2. Apply a piece of Xeroform gauze to right leg, then cover with foam dressing. Change foam dressing Q 3 days or PRN soiling.   06/22/22 1452            Follow-up Information     Merri Brunette, MD. Go on 07/13/2022.   Specialty: Family Medicine Why: :15am Contact information: [address] [place] [address] [phone]                Allergies  Allergen Reactions   Nsaids Other (See Comments)    Cannot have any of these because he is currently taking Eliquis    Consultations: None   Procedures/Studies: CT  Head Wo Contrast  Result Date: 06/21/2022 CLINICAL DATA:  Unwitnessed fall. EXAM: CT HEAD WITHOUT CONTRAST TECHNIQUE: Contiguous axial images were obtained from the base of the skull through the vertex without intravenous contrast. RADIATION DOSE REDUCTION: This exam was performed according to the departmental dose-optimization program which includes automated exposure control, adjustment of the mA and/or kV according to patient size and/or use of iterative reconstruction technique. COMPARISON:  None Available. FINDINGS: Brain: There is no evidence for acute hemorrhage, hydrocephalus, mass lesion, or abnormal extra-axial fluid collection. No definite CT evidence for acute infarction. Diffuse loss of parenchymal volume is consistent with atrophy. Patchy low attenuation in the deep hemispheric and periventricular white matter is nonspecific, but likely reflects chronic microvascular ischemic demyelination. Vascular: No hyperdense vessel or unexpected calcification. Skull: No evidence for fracture. No worrisome lytic or sclerotic lesion. Sinuses/Orbits: The visualized paranasal sinuses and mastoid air cells are clear. Visualized portions of the globes and intraorbital fat are unremarkable. Other: None.  IMPRESSION: 1. No acute intracranial abnormality. 2. Atrophy with chronic small vessel ischemic disease. Electronically Signed   By: Kennith Center M.D.   On: 06/21/2022 06:15   DG Chest 2 View  Result Date: 06/21/2022 CLINICAL DATA:  Fall. EXAM: CHEST - 2 VIEW COMPARISON:  06/19/2020 FINDINGS: Low volume chest with asymmetric right diaphragm elevation and overlying scar. Cardiomegaly with left-sided coronary stenting. No edema, effusion, or pneumothorax. Atherosclerosis. IMPRESSION: 1. Chronic low volume chest with asymmetric right diaphragm elevation and mild atelectasis/scarring. 2. Cardiomegaly without pulmonary edema. 3. No significant change since 2022. Electronically Signed   By: Tiburcio Pea M.D.   On: 06/21/2022 05:57   (Echo, Carotid, EGD, Colonoscopy, ERCP)    Subjective: Patient seen and examined in the morning rounds.  Denied any complaints.  He needed more than 1 person to support him to go around to the bathroom. Went back to examine patient with his wife at the bedside.  We discussed about patient's difficulty mobility, discussed whether they want him to go to a nursing home or short-term rehab but patient and wife did not agree with it. They feel like he is at about his baseline.  They would like to go home and arrange more support system at home.   Discharge Exam: Vitals:   06/22/22 1130 06/22/22 1221  BP: 131/68 (!) 112/99  Pulse:  84  Resp: 18 18  Temp: (!) 97.5 F (36.4 C) 98.2 F (36.8 C)  SpO2:  98%   Vitals:   06/22/22 0820 06/22/22 0825 06/22/22 1130 06/22/22 1221  BP: (!) 108/52 (!) 108/52 131/68 (!) 112/99  Pulse:    84  Resp:   18 18  Temp:   (!) 97.5 F (36.4 C) 98.2 F (36.8 C)  TempSrc:    Oral  SpO2:    98%  Weight:      Height:        General: Pt is alert, awake, not in acute distress Frail and debilitated , chronically sick looking, on room air. Alert and oriented .  Cardiovascular: irregular , S1/S2 +, no rubs, no gallops Respiratory: CTA  bilaterally, no wheezing, no rhonchi Abdominal: Soft, NT, ND, bowel sounds + Extremities:  Chronic bilateral leg edema Both legs with skin abrasion, picture in the chart     The results of significant diagnostics from this hospitalization (including imaging, microbiology, ancillary and laboratory) are listed below for reference.     Microbiology: No results found for this or any previous visit (from the past 240  hour(s)).   Labs: BNP (last 3 results) Recent Labs    02/22/22 1736 06/21/22 0605  BNP 222.0* 216.6*   Basic Metabolic Panel: Recent Labs  Lab 06/21/22 0605 06/22/22 0135 06/22/22 1251  NA 138 135  --   K 3.5 2.6* 3.3*  CL 91* 92*  --   CO2 31 30  --   GLUCOSE 102* 110*  --   BUN 45* 46*  --   CREATININE 2.11* 2.07*  --   CALCIUM 9.4 8.7*  --   MG 2.2  --   --    Liver Function Tests: Recent Labs  Lab 06/21/22 0605  AST 17  ALT 5  ALKPHOS 140*  BILITOT 2.5*  PROT 7.9  ALBUMIN 3.6   No results for input(s): "LIPASE", "AMYLASE" in the last 168 hours. No results for input(s): "AMMONIA" in the last 168 hours. CBC: Recent Labs  Lab 06/21/22 0605 06/22/22 0135  WBC 8.4 5.2  NEUTROABS 7.1  --   HGB 13.4 11.8*  HCT 41.0 36.2*  MCV 89.3 89.6  PLT 242 197   Cardiac Enzymes: No results for input(s): "CKTOTAL", "CKMB", "CKMBINDEX", "TROPONINI" in the last 168 hours. BNP: Invalid input(s): "POCBNP" CBG: No results for input(s): "GLUCAP" in the last 168 hours. D-Dimer No results for input(s): "DDIMER" in the last 72 hours. Hgb A1c No results for input(s): "HGBA1C" in the last 72 hours. Lipid Profile No results for input(s): "CHOL", "HDL", "LDLCALC", "TRIG", "CHOLHDL", "LDLDIRECT" in the last 72 hours. Thyroid function studies No results for input(s): "TSH", "T4TOTAL", "T3FREE", "THYROIDAB" in the last 72 hours.  Invalid input(s): "FREET3" Anemia work up No results for input(s): "VITAMINB12", "FOLATE", "FERRITIN", "TIBC", "IRON", "RETICCTPCT"  in the last 72 hours. Urinalysis    Component Value Date/Time   COLORURINE YELLOW 06/22/2022 0027   APPEARANCEUR CLEAR 06/22/2022 0027   LABSPEC 1.011 06/22/2022 0027   PHURINE 6.0 06/22/2022 0027   GLUCOSEU 50 (A) 06/22/2022 0027   HGBUR NEGATIVE 06/22/2022 0027   BILIRUBINUR NEGATIVE 06/22/2022 0027   KETONESUR NEGATIVE 06/22/2022 0027   PROTEINUR 30 (A) 06/22/2022 0027   NITRITE NEGATIVE 06/22/2022 0027   LEUKOCYTESUR NEGATIVE 06/22/2022 0027   Sepsis Labs Recent Labs  Lab 06/21/22 0605 06/22/22 0135  WBC 8.4 5.2   Microbiology No results found for this or any previous visit (from the past 240 hour(s)).   Time coordinating discharge:  32 minutes  SIGNED:   Dorcas Carrow, MD  [facility] 06/22/2022, 2:52 PM

## 2022-06-22 NOTE — Progress Notes (Signed)
Approximately 1600-- Pt discharged to home. At time of discharge,pt A&O x 3 (baseline this hospitalization) and VSS. No S/S of acute distress noted. Telemetry and all PIVs removed. Telemetry notified. All pt belongings taken with pt--pt verified. AVS discharge instructions provided to pt and pt's wife at bedside. All questions answered at this time. Pt being transported home with wife in personal vehicle.

## 2022-06-24 ENCOUNTER — Encounter (HOSPITAL_BASED_OUTPATIENT_CLINIC_OR_DEPARTMENT_OTHER): Payer: Medicare Other | Admitting: General Surgery

## 2022-06-24 DIAGNOSIS — L97812 Non-pressure chronic ulcer of other part of right lower leg with fat layer exposed: Secondary | ICD-10-CM | POA: Diagnosis not present

## 2022-06-24 DIAGNOSIS — L97822 Non-pressure chronic ulcer of other part of left lower leg with fat layer exposed: Secondary | ICD-10-CM | POA: Diagnosis not present

## 2022-06-24 DIAGNOSIS — L97422 Non-pressure chronic ulcer of left heel and midfoot with fat layer exposed: Secondary | ICD-10-CM | POA: Diagnosis not present

## 2022-06-24 DIAGNOSIS — L97412 Non-pressure chronic ulcer of right heel and midfoot with fat layer exposed: Secondary | ICD-10-CM | POA: Diagnosis not present

## 2022-06-24 DIAGNOSIS — L97212 Non-pressure chronic ulcer of right calf with fat layer exposed: Secondary | ICD-10-CM | POA: Diagnosis not present

## 2022-06-24 DIAGNOSIS — I890 Lymphedema, not elsewhere classified: Secondary | ICD-10-CM | POA: Diagnosis not present

## 2022-06-25 DIAGNOSIS — I890 Lymphedema, not elsewhere classified: Secondary | ICD-10-CM | POA: Diagnosis not present

## 2022-06-25 DIAGNOSIS — J9811 Atelectasis: Secondary | ICD-10-CM | POA: Diagnosis not present

## 2022-06-25 DIAGNOSIS — S81801D Unspecified open wound, right lower leg, subsequent encounter: Secondary | ICD-10-CM | POA: Diagnosis not present

## 2022-06-25 DIAGNOSIS — E876 Hypokalemia: Secondary | ICD-10-CM | POA: Diagnosis not present

## 2022-06-25 DIAGNOSIS — I5032 Chronic diastolic (congestive) heart failure: Secondary | ICD-10-CM | POA: Diagnosis not present

## 2022-06-25 DIAGNOSIS — N179 Acute kidney failure, unspecified: Secondary | ICD-10-CM | POA: Diagnosis not present

## 2022-06-25 DIAGNOSIS — Z9181 History of falling: Secondary | ICD-10-CM | POA: Diagnosis not present

## 2022-06-25 DIAGNOSIS — I130 Hypertensive heart and chronic kidney disease with heart failure and stage 1 through stage 4 chronic kidney disease, or unspecified chronic kidney disease: Secondary | ICD-10-CM | POA: Diagnosis not present

## 2022-06-25 DIAGNOSIS — Z7901 Long term (current) use of anticoagulants: Secondary | ICD-10-CM | POA: Diagnosis not present

## 2022-06-25 DIAGNOSIS — N189 Chronic kidney disease, unspecified: Secondary | ICD-10-CM | POA: Diagnosis not present

## 2022-06-25 DIAGNOSIS — G20A1 Parkinson's disease without dyskinesia, without mention of fluctuations: Secondary | ICD-10-CM | POA: Diagnosis not present

## 2022-06-25 DIAGNOSIS — S81802D Unspecified open wound, left lower leg, subsequent encounter: Secondary | ICD-10-CM | POA: Diagnosis not present

## 2022-06-25 DIAGNOSIS — I4891 Unspecified atrial fibrillation: Secondary | ICD-10-CM | POA: Diagnosis not present

## 2022-06-25 NOTE — Progress Notes (Signed)
URBANO, MILHOUSE E ([id number]) [id number] Page 1 of 10 Visit Report for 06/24/2022 Arrival Information Details Patient Name: Date of Service: CURLY, MACKOWSKI 06/24/2022 2:00 PM Medical Record Number: [id number] Patient Account Number: [id number] Date of Birth/Sex: Treating RN: [birth year] (87 y.o. M) Primary Care Makenze Ellett: Merri Brunette Other Clinician: Referring Anhelica Fowers: Treating Andra Matsuo/Extender: Mortimer Fries Weeks in Treatment: 4 Visit Information History Since Last Visit All ordered tests and consults were completed: No Patient Arrived: Wheel Chair Added or deleted any medications: No Arrival Time: 14:08 Any new allergies or adverse reactions: No Accompanied By: wife Had a fall or experienced change in Yes Transfer Assistance: None activities of daily living that may affect Patient Identification Verified: Yes risk of falls: Secondary Verification Process Completed: Yes Signs or symptoms of abuse/neglect since last visito No Patient Requires Transmission-Based Precautions: No Hospitalized since last visit: Yes Patient Has Alerts: Yes Implantable device outside of the clinic excluding No Patient Alerts: ABI: BLE Revloc cellular tissue based products placed in the center since last visit: Has Dressing in Place as Prescribed: No Has Compression in Place as Prescribed: No Pain Present Now: No Notes no compression since Monday Electronic Signature(s) Signed: 06/24/2022 4:53:34 PM By: Zenaida Deed RN, BSN Entered By: Zenaida Deed on 06/24/2022 14:36:25 -------------------------------------------------------------------------------- Encounter Discharge Information Details Patient Name: Date of Service: [name], Marquis E. 06/24/2022 2:00 PM Medical Record Number: [id number] Patient Account Number: [id number] Date of Birth/Sex: Treating RN: 05/06/1935 (87 y.o. [name] Primary Care Arly Salminen: Merri Brunette Other  Clinician: Referring Jaise Moser: Treating Sidonie Dexheimer/Extender: Mortimer Fries Weeks in Treatment: 4 Encounter Discharge Information Items Post Procedure Vitals Discharge Condition: Stable Temperature (F): 97.7 Ambulatory Status: Wheelchair Pulse (bpm): 60 Discharge Destination: Home Respiratory Rate (breaths/min): 18 Transportation: Private Auto Blood Pressure (mmHg): 127/67 Accompanied By: spouse Schedule Follow-up Appointment: Yes Clinical Summary of Care: Patient Declined Electronic Signature(s) Signed: 06/24/2022 4:53:34 PM By: Zenaida Deed RN, BSN Entered By: Zenaida Deed on 06/24/2022 15:19:25 -------------------------------------------------------------------------------- Lower Extremity Assessment Details Patient Name: Date of Service: [name], Yuriel E. 06/24/2022 2:00 PM Medical Record Number: [id number] Patient Account Number: [id number] Date of Birth/Sex: Treating RN: Jul 06, 1935 (87 y.o. [name] Primary Care Rozanne Heumann: Merri Brunette Other Clinician: Referring Nicoli Nardozzi: Treating Jammy Stlouis/Extender: Mortimer Fries Weeks in Treatment: 4 TIMARION, AGCAOILI E ([id number]) [id number] Page 2 of 10 Edema Assessment Assessed: [Left: No] [Right: No] Edema: [Left: Yes] [Right: Yes] Calf Left: Right: Point of Measurement: From Medial Instep 44.5 cm 47.5 cm Ankle Left: Right: Point of Measurement: From Medial Instep 27 cm 27 cm Vascular Assessment Pulses: Dorsalis Pedis Palpable: [Left:Yes] [Right:Yes] Electronic Signature(s) Signed: 06/24/2022 4:53:34 PM By: Zenaida Deed RN, BSN Entered By: Zenaida Deed on 06/24/2022 14:39:33 -------------------------------------------------------------------------------- Multi Wound Chart Details Patient Name: Date of Service: [name], [name]. 06/24/2022 2:00 PM Medical Record Number: [id number] Patient Account Number: [id number] Date of Birth/Sex:  Treating RN: 05/31/1935 (87 y.o. M) Primary Care Merel Santoli: Merri Brunette Other Clinician: Referring Leeandra Ellerson: Treating Robb Sibal/Extender: Otho Ket, Candace Weeks in Treatment: 4 Vital Signs Height(in): 71 Pulse(bpm): 60 Weight(lbs): 190 Blood Pressure(mmHg): 127/67 Body Mass Index(BMI): 26.5 Temperature(F): 97.7 Respiratory Rate(breaths/min): 18 [11:Photos:] [5R:No Photos] Right, Anterior Lower Leg Right Calcaneus Left, Lateral Lower Leg Wound Location: Not Known Shear/Friction Trauma Wounding Event: T be determined o Lymphedema Lymphedema Primary Etiology: Cataracts, Lymphedema, Arrhythmia, Cataracts, Lymphedema, Arrhythmia, Cataracts, Lymphedema, Arrhythmia, Comorbid History: Congestive Heart Failure, Coronary Congestive Heart Failure, Coronary Congestive Heart Failure, Coronary Artery Disease, Hypertension, Artery Disease, Hypertension, Artery  Disease, Hypertension, Osteoarthritis, Received Radiation, Osteoarthritis, Received Radiation, Osteoarthritis, Received Radiation, Confinement Anxiety Confinement Anxiety Confinement Anxiety 06/10/2022 05/24/2022 05/09/2022 Date Acquired: 0 4 4 Weeks of Treatment: Open Open Open Wound Status: No Yes No Wound Recurrence: 3x1.7x0.1 0.3x0.3x0.3 1.5x1.7x0.1 Measurements L x W x D (cm) 4.006 0.071 2.003 A (cm) : rea 0.401 0.021 0.2 Volume (cm) : N/A 0.00% 67.30% % Reduction in Area: N/A -200.00% 67.40% % Reduction in Volume: Full Thickness Without Exposed Full Thickness Without Exposed Full Thickness Without Exposed Classification: Support Structures Support Structures Support Structures Medium None Present Medium Exudate Amount: MEHMET, SCALLY E ([id number]) [id number] Page 3 of 10 Serosanguineous N/A Serosanguineous Exudate Type: red, brown N/A red, brown Exudate Color: Flat and Intact Flat and Intact Distinct, outline attached Wound Margin: Large (67-100%) None Present (0%)  Large (67-100%) Granulation Amount: Red N/A Red Granulation Quality: Small (1-33%) None Present (0%) Small (1-33%) Necrotic Amount: Eschar N/A Adherent Slough Necrotic Tissue: Fat Layer (Subcutaneous Tissue): Yes Fascia: No Fat Layer (Subcutaneous Tissue): Yes Exposed Structures: Fascia: No Fat Layer (Subcutaneous Tissue): No Fascia: No Tendon: No Tendon: No Tendon: No Muscle: No Muscle: No Muscle: No Joint: No Joint: No Joint: No Bone: No Bone: No Bone: No Small (1-33%) Large (67-100%) Medium (34-66%) Epithelialization: Debridement - Selective/Open Wound Debridement - Selective/Open Wound Debridement - Selective/Open Wound Debridement: Pre-procedure Verification/Time Out 14:45 14:45 14:45 Taken: Lidocaine 4% Topical Solution Lidocaine 4% Topical Solution Lidocaine 4% Topical Solution Pain Control: Principal Financial Tissue Debrided: Non-Viable Tissue Skin/Epidermis Non-Viable Tissue Level: 4 0.07 2 Debridement A (sq cm): rea Curette Curette Curette Instrument: Minimum Minimum Minimum Bleeding: Pressure Pressure Pressure Hemostasis A chieved: 0 0 0 Procedural Pain: 0 0 0 Post Procedural Pain: Procedure was tolerated well Procedure was tolerated well Procedure was tolerated well Debridement Treatment Response: 3x1.7x0.1 0.3x0.3x0.1 1.5x1.7x0.1 Post Debridement Measurements L x W x D (cm) 0.401 0.007 0.2 Post Debridement Volume: (cm) No Abnormalities Noted Callus: Yes Excoriation: No Periwound Skin Texture: Induration: No Callus: No Crepitus: No Rash: No Scarring: No No Abnormalities Noted No Abnormalities Noted Maceration: No Periwound Skin Moisture: Dry/Scaly: No Hemosiderin Staining: Yes No Abnormalities Noted Hemosiderin Staining: Yes Periwound Skin Color: Atrophie Blanche: No Cyanosis: No Ecchymosis: No Erythema: No Mottled: No Pallor: No Rubor: No No Abnormality No Abnormality No Abnormality Temperature: N/A Yes Yes Tenderness on  Palpation: Debridement Debridement Debridement Procedures Performed: Treatment Notes Wound #11 (Lower Leg) Wound Laterality: Right, Anterior Cleanser Soap and Water Discharge Instruction: May shower and wash wound with dial antibacterial soap and water prior to dressing change. Wound Cleanser Discharge Instruction: Cleanse the wound with wound cleanser prior to applying a clean dressing using gauze sponges, not tissue or cotton balls. Peri-Wound Care Sween Lotion (Moisturizing lotion) Discharge Instruction: Apply moisturizing lotion as directed Topical Primary Dressing Maxorb Extra Ag+ Alginate Dressing, 2x2 (in/in) Discharge Instruction: Apply to wound bed as instructed Secondary Dressing Woven Gauze Sponge, Non-Sterile 4x4 in Discharge Instruction: Apply over primary dressing as directed. Secured With Compression Wrap Urgo K2, two layer compression system, regular Discharge Instruction: Apply Urgo K2 as directed (alternative to 4 layer compression). Compression Stockings TRAYE, BATES E ([id number]) [id number] Page 4 of 10 Add-Ons Wound #5R (Calcaneus) Wound Laterality: Right Cleanser Soap and Water Discharge Instruction: May shower and wash wound with dial antibacterial soap and water prior to dressing change. Wound Cleanser Discharge Instruction: Cleanse the wound with wound cleanser prior to applying a clean dressing using gauze sponges, not tissue or cotton balls. Peri-Wound Care Sween Lotion (Moisturizing  lotion) Discharge Instruction: Apply moisturizing lotion as directed Topical Primary Dressing Maxorb Extra Ag+ Alginate Dressing, 2x2 (in/in) Discharge Instruction: Apply to wound bed as instructed Secondary Dressing ALLEVYN Heel 4 1/2in x 5 1/2in / 10.5cm x 13.5cm Discharge Instruction: Apply over primary dressing as directed. Woven Gauze Sponge, Non-Sterile 4x4 in Discharge Instruction: Apply over primary dressing as  directed. Secured With Compression Wrap Urgo K2, two layer compression system, regular Discharge Instruction: Apply Urgo K2 as directed (alternative to 4 layer compression). Compression Stockings Add-Ons Wound #8 (Lower Leg) Wound Laterality: Left, Lateral Cleanser Soap and Water Discharge Instruction: May shower and wash wound with dial antibacterial soap and water prior to dressing change. Wound Cleanser Discharge Instruction: Cleanse the wound with wound cleanser prior to applying a clean dressing using gauze sponges, not tissue or cotton balls. Peri-Wound Care Sween Lotion (Moisturizing lotion) Discharge Instruction: Apply moisturizing lotion as directed Topical Primary Dressing Maxorb Extra Ag+ Alginate Dressing, 2x2 (in/in) Discharge Instruction: Apply to wound bed as instructed Secondary Dressing Woven Gauze Sponge, Non-Sterile 4x4 in Discharge Instruction: Apply over primary dressing as directed. Secured With Compression Wrap Urgo K2, two layer compression system, regular Discharge Instruction: Apply Urgo K2 as directed (alternative to 4 layer compression). Compression Stockings Add-Ons Electronic Signature(s) Signed: 06/24/2022 3:21:03 PM By: Duanne Guess MD FACS Previous Signature: 06/24/2022 3:09:16 PM Version By: Duanne Guess MD FACS Entered By: Duanne Guess on 06/24/2022 15:21:02 Gayland Curry ([id number]) [id number] Page 5 of 10 -------------------------------------------------------------------------------- Multi-Disciplinary Care Plan Details Patient Name: Date of Service: ALMANDO, BRAWLEY 06/24/2022 2:00 PM Medical Record Number: [id number] Patient Account Number: [id number] Date of Birth/Sex: Treating RN: [birth year] (87 y.o. [name] Primary Care Dimitris Shanahan: Merri Brunette Other Clinician: Referring Zakye Baby: Treating Glendoris Nodarse/Extender: Willey Blade in Treatment:  4 Multidisciplinary Care Plan reviewed with physician Active Inactive Abuse / Safety / Falls / Self Care Management Nursing Diagnoses: Impaired physical mobility Potential for falls Goals: Patient/caregiver will verbalize/demonstrate measures taken to improve the patient's personal safety Date Initiated: 05/24/2022 Target Resolution Date: 07/08/2022 Goal Status: Active Patient/caregiver will verbalize/demonstrate measures taken to prevent injury and/or falls Date Initiated: 05/24/2022 Target Resolution Date: 07/08/2022 Goal Status: Active Interventions: Assess fall risk on admission and as needed Notes: Wound/Skin Impairment Nursing Diagnoses: Impaired tissue integrity Knowledge deficit related to ulceration/compromised skin integrity Goals: Patient/caregiver will verbalize understanding of skin care regimen Date Initiated: 05/24/2022 Target Resolution Date: 07/08/2022 Goal Status: Active Interventions: Assess ulceration(s) every visit Treatment Activities: Skin care regimen initiated : 05/24/2022 Topical wound management initiated : 05/24/2022 Notes: Electronic Signature(s) Signed: 06/24/2022 4:53:34 PM By: Zenaida Deed RN, BSN Entered By: Zenaida Deed on 06/24/2022 14:49:25 -------------------------------------------------------------------------------- Pain Assessment Details Patient Name: Date of Service: [name], Daryus E. 06/24/2022 2:00 PM Medical Record Number: [id number] Patient Account Number: [id number] Date of Birth/Sex: Treating RN: 1936-01-09 (87 y.o. M) Primary Care Adah Stoneberg: Merri Brunette Other Clinician: Referring Antanisha Mohs: Treating Kathline Banbury/Extender: Mortimer Fries Weeks in Treatment: 4 Active Problems Location of Pain Severity and Description of Pain Patient Has JOANDRY, SLAGTER E ([id number]) [id number] Page 6 of 10 Patient Has Paino Yes Site Locations Pain Location: Generalized Pain With  Dressing Change: No Duration of the Pain. Constant / Intermittento Intermittent Rate the pain. Current Pain Level: 4 Worst Pain Level: 6 Least Pain Level: 0 Tolerable Pain Level: 2 Character of Pain Describe the Pain: Aching, Other: sore Pain Management and Medication Current Pain Management: Notes pt reports general pain from fall this week Electronic Signature(s)  Signed: 06/24/2022 4:53:34 PM By: Zenaida Deed RN, BSN Entered By: Zenaida Deed on 06/24/2022 14:36:13 -------------------------------------------------------------------------------- Patient/Caregiver Education Details Patient Name: Date of Service: Otilio Saber 4/26/2024andnbsp2:00 PM Medical Record Number: [id number] Patient Account Number: [id number] Date of Birth/Gender: Treating RN: 11/01/1935 (87 y.o. [name] Primary Care Physician: Merri Brunette Other Clinician: Referring Physician: Treating Physician/Extender: Willey Blade in Treatment: 4 Education Assessment Education Provided To: Patient Education Topics Provided Venous: Methods: Explain/Verbal Responses: Reinforcements needed, State content correctly Wound/Skin Impairment: Methods: Explain/Verbal Responses: Reinforcements needed, State content correctly Electronic Signature(s) Signed: 06/24/2022 4:53:34 PM By: Zenaida Deed RN, BSN Entered By: Zenaida Deed on 06/24/2022 14:50:02 -------------------------------------------------------------------------------- Wound Assessment Details Patient Name: Date of Service: [name], Armour E. 06/24/2022 2:00 PM Medical Record Number: [id number] Patient Account Number: [id number] CLAUDELL, WOHLER ([id number]) [phone].pdf Page 7 of 10 Date of Birth/Sex: Treating RN: [birth year] (87 y.o. [name] Primary Care Ellanora Rayborn: Merri Brunette Other Clinician: Referring Brita Jurgensen: Treating Phil Corti/Extender: Mortimer Fries Weeks in Treatment: 4 Wound Status Wound Number: 11 Primary T be determined o Etiology: Wound Location: Right, Anterior Lower Leg Wound Open Wounding Event: Not Known Status: Date Acquired: 06/10/2022 Comorbid Cataracts, Lymphedema, Arrhythmia, Congestive Heart Failure, Weeks Of Treatment: 0 History: Coronary Artery Disease, Hypertension, Osteoarthritis, Received Clustered Wound: No Radiation, Confinement Anxiety Photos Wound Measurements Length: (cm) 3 Width: (cm) 1.7 Depth: (cm) 0.1 Area: (cm) 4.006 Volume: (cm) 0.401 % Reduction in Area: % Reduction in Volume: Epithelialization: Small (1-33%) Tunneling: No Undermining: No Wound Description Classification: Full Thickness Without Exposed Support Structures Wound Margin: Flat and Intact Exudate Amount: Medium Exudate Type: Serosanguineous Exudate Color: red, brown Foul Odor After Cleansing: No Slough/Fibrino Yes Wound Bed Granulation Amount: Large (67-100%) Exposed Structure Granulation Quality: Red Fascia Exposed: No Necrotic Amount: Small (1-33%) Fat Layer (Subcutaneous Tissue) Exposed: Yes Necrotic Quality: Eschar Tendon Exposed: No Muscle Exposed: No Joint Exposed: No Bone Exposed: No Periwound Skin Texture Texture Color No Abnormalities Noted: Yes No Abnormalities Noted: No Hemosiderin Staining: Yes Moisture No Abnormalities Noted: Yes Temperature / Pain Temperature: No Abnormality Electronic Signature(s) Signed: 06/24/2022 4:53:34 PM By: Zenaida Deed RN, BSN Entered By: Zenaida Deed on 06/24/2022 14:41:01 -------------------------------------------------------------------------------- Wound Assessment Details Patient Name: Date of Service: [name], Arless E. 06/24/2022 2:00 PM Medical Record Number: [id number] Patient Account Number: [id number] Date of Birth/Sex: Treating RN: April 15, 1935 (87 y.o. [name] Primary Care Lundy Cozart: Merri Brunette Other  Clinician: Referring Demetrus Pavao: Treating Josimar Corning/Extender: Mortimer Fries Weeks in Treatment: 4 ESVIN, HNAT E ([id number]) [id number] Page 8 of 10 Wound Status Wound Number: 5R Primary Lymphedema Etiology: Wound Location: Right Calcaneus Wound Open Wounding Event: Shear/Friction Status: Date Acquired: 05/24/2022 Comorbid Cataracts, Lymphedema, Arrhythmia, Congestive Heart Failure, Weeks Of Treatment: 4 History: Coronary Artery Disease, Hypertension, Osteoarthritis, Received Clustered Wound: No Radiation, Confinement Anxiety Wound Measurements Length: (cm) 0.3 Width: (cm) 0.3 Depth: (cm) 0.3 Area: (cm) 0.071 Volume: (cm) 0.021 % Reduction in Area: 0% % Reduction in Volume: -200% Epithelialization: Large (67-100%) Tunneling: No Undermining: No Wound Description Classification: Full Thickness Without Exposed Support Structures Wound Margin: Flat and Intact Exudate Amount: None Present Foul Odor After Cleansing: No Slough/Fibrino No Wound Bed Granulation Amount: None Present (0%) Exposed Structure Necrotic Amount: None Present (0%) Fascia Exposed: No Fat Layer (Subcutaneous Tissue) Exposed: No Tendon Exposed: No Muscle Exposed: No Joint Exposed: No Bone Exposed: No Periwound Skin Texture Texture Color No Abnormalities Noted: No No Abnormalities Noted: Yes Callus: Yes Temperature / Pain Temperature:  No Abnormality Moisture No Abnormalities Noted: Yes Tenderness on Palpation: Yes Treatment Notes Wound #5R (Calcaneus) Wound Laterality: Right Cleanser Soap and Water Discharge Instruction: May shower and wash wound with dial antibacterial soap and water prior to dressing change. Wound Cleanser Discharge Instruction: Cleanse the wound with wound cleanser prior to applying a clean dressing using gauze sponges, not tissue or cotton balls. Peri-Wound Care Sween Lotion (Moisturizing lotion) Discharge Instruction: Apply  moisturizing lotion as directed Topical Primary Dressing Maxorb Extra Ag+ Alginate Dressing, 2x2 (in/in) Discharge Instruction: Apply to wound bed as instructed Secondary Dressing ALLEVYN Heel 4 1/2in x 5 1/2in / 10.5cm x 13.5cm Discharge Instruction: Apply over primary dressing as directed. Woven Gauze Sponge, Non-Sterile 4x4 in Discharge Instruction: Apply over primary dressing as directed. Secured With Compression Wrap Urgo K2, two layer compression system, regular Discharge Instruction: Apply Urgo K2 as directed (alternative to 4 layer compression). Compression Stockings Add-Ons DORAN, NESTLE E ([id number]) [id number] Page 9 of 10 Electronic Signature(s) Signed: 06/24/2022 4:53:34 PM By: Zenaida Deed RN, BSN Entered By: Zenaida Deed on 06/24/2022 14:55:21 -------------------------------------------------------------------------------- Wound Assessment Details Patient Name: Date of Service: [name], Mick E. 06/24/2022 2:00 PM Medical Record Number: [id number] Patient Account Number: [id number] Date of Birth/Sex: Treating RN: Jun 07, 1935 (87 y.o. [name] Primary Care Sheryn Aldaz: Merri Brunette Other Clinician: Referring Montavious Wierzba: Treating Kearra Calkin/Extender: Mortimer Fries Weeks in Treatment: 4 Wound Status Wound Number: 8 Primary Lymphedema Etiology: Wound Location: Left, Lateral Lower Leg Wound Open Wounding Event: Trauma Status: Date Acquired: 05/09/2022 Comorbid Cataracts, Lymphedema, Arrhythmia, Congestive Heart Failure, Weeks Of Treatment: 4 History: Coronary Artery Disease, Hypertension, Osteoarthritis, Received Clustered Wound: No Radiation, Confinement Anxiety Photos Wound Measurements Length: (cm) 1.5 Width: (cm) 1.7 Depth: (cm) 0.1 Area: (cm) 2.003 Volume: (cm) 0.2 % Reduction in Area: 67.3% % Reduction in Volume: 67.4% Epithelialization: Medium (34-66%) Tunneling: No Undermining:  No Wound Description Classification: Full Thickness Without Exposed Suppor Wound Margin: Distinct, outline attached Exudate Amount: Medium Exudate Type: Serosanguineous Exudate Color: red, brown t Structures Foul Odor After Cleansing: No Slough/Fibrino Yes Wound Bed Granulation Amount: Large (67-100%) Exposed Structure Granulation Quality: Red Fascia Exposed: No Necrotic Amount: Small (1-33%) Fat Layer (Subcutaneous Tissue) Exposed: Yes Necrotic Quality: Adherent Slough Tendon Exposed: No Muscle Exposed: No Joint Exposed: No Bone Exposed: No Periwound Skin Texture Texture Color No Abnormalities Noted: Yes No Abnormalities Noted: No Atrophie Blanche: No Moisture Cyanosis: No No Abnormalities Noted: Yes Ecchymosis: No Erythema: No Hemosiderin Staining: Yes Mottled: No Pallor: No Rubor: No Temperature / Pain Doucette, Wells E ([id number]) [id number] Page 10 of 10 Temperature: No Abnormality Tenderness on Palpation: Yes Treatment Notes Wound #8 (Lower Leg) Wound Laterality: Left, Lateral Cleanser Soap and Water Discharge Instruction: May shower and wash wound with dial antibacterial soap and water prior to dressing change. Wound Cleanser Discharge Instruction: Cleanse the wound with wound cleanser prior to applying a clean dressing using gauze sponges, not tissue or cotton balls. Peri-Wound Care Sween Lotion (Moisturizing lotion) Discharge Instruction: Apply moisturizing lotion as directed Topical Primary Dressing Maxorb Extra Ag+ Alginate Dressing, 2x2 (in/in) Discharge Instruction: Apply to wound bed as instructed Secondary Dressing Woven Gauze Sponge, Non-Sterile 4x4 in Discharge Instruction: Apply over primary dressing as directed. Secured With Compression Wrap Urgo K2, two layer compression system, regular Discharge Instruction: Apply Urgo K2 as directed (alternative to 4 layer compression). Compression  Stockings Add-Ons Electronic Signature(s) Signed: 06/24/2022 4:53:34 PM By: Zenaida Deed RN, BSN Entered By: Zenaida Deed on 06/24/2022 14:42:17 -------------------------------------------------------------------------------- Vitals Details Patient Name:  Date of Service: TYRELL, SEIFER 06/24/2022 2:00 PM Medical Record Number: [id number] Patient Account Number: [id number] Date of Birth/Sex: Treating RN: [birth year] (87 y.o. M) Primary Care Peggy Monk: Merri Brunette Other Clinician: Referring Verlie Hellenbrand: Treating Lanea Vankirk/Extender: Otho Ket, Candace Weeks in Treatment: 4 Vital Signs Time Taken: 02:09 Temperature (F): 97.7 Height (in): 71 Pulse (bpm): 60 Weight (lbs): 190 Respiratory Rate (breaths/min): 18 Body Mass Index (BMI): 26.5 Blood Pressure (mmHg): 127/67 Reference Range: 80 - 120 mg / dl Electronic Signature(s) Signed: 06/24/2022 4:53:34 PM By: Zenaida Deed RN, BSN Entered By: Zenaida Deed on 06/24/2022 14:36:29

## 2022-06-25 NOTE — Progress Notes (Signed)
[name], [name] ([id number]) [id number] Page 1 of 12 Visit Report for 06/24/2022 Chief Complaint Document Details Patient Name: Date of Service: [name], [name] 06/24/2022 2:00 PM Medical Record Number: [id number] Patient Account Number: [id number] Date of Birth/Sex: Treating RN: 12/15/1935 (87 y.o. M) Primary Care Provider: Merri [name] Other Clinician: Referring Provider: Treating Provider/Extender: [name] Weeks in Treatment: 4 Information Obtained from: Patient Chief Complaint Patient presents for treatment of open ulcers due to venous insufficiency and lymphedema Electronic Signature(s) Signed: 06/24/2022 3:21:11 PM By: [name] Guess MD FACS Entered By: [name] on 06/24/2022 15:21:11 -------------------------------------------------------------------------------- Debridement Details Patient Name: Date of Service: [name], [name] 06/24/2022 2:00 PM Medical Record Number: [id number] Patient Account Number: [id number] Date of Birth/Sex: Treating RN: 1935/11/17 (87 y.o. [name] Primary Care Provider: Merri [name] Other Clinician: Referring Provider: Treating Provider/Extender: [name] Weeks in Treatment: 4 Debridement Performed for Assessment: Wound #11 Right,Anterior Lower Leg Performed By: Physician [name] Guess, MD Debridement Type: Debridement Level of Consciousness (Pre-procedure): Awake and Alert Pre-procedure Verification/Time Out Yes - 14:45 Taken: Start Time: 14:47 Pain Control: Lidocaine 4% T opical Solution Percent of Wound Bed Debrided: 100% T Area Debrided (cm): otal 4 Tissue and other material debrided: Non-Viable, Slough, Slough Level: Non-Viable Tissue Debridement Description: Selective/Open Wound Instrument: Curette Bleeding: Minimum Hemostasis Achieved: Pressure Procedural Pain: 0 Post Procedural Pain: 0 Response to Treatment:  Procedure was tolerated well Level of Consciousness (Post- Awake and Alert procedure): Post Debridement Measurements of Total Wound Length: (cm) 3 Width: (cm) 1.7 Depth: (cm) 0.1 Volume: (cm) 0.401 Character of Wound/Ulcer Post Debridement: Improved Post Procedure Diagnosis Same as Pre-procedure Notes : Scribed for Dr. Lady [name] by [name] Deed, RN Electronic Signature(s) Signed: 06/24/2022 4:46:59 PM By: [name] Guess MD FACS Signed: 06/24/2022 4:53:34 PM By: [name] Deed RN, BSN [name] ([id number]) [id number].pdf Page 2 of 12 Entered By: [name] on 06/24/2022 14:51:51 -------------------------------------------------------------------------------- Debridement Details Patient Name: Date of Service: [name] 06/24/2022 2:00 PM Medical Record Number: [id number] Patient Account Number: [id number] Date of Birth/Sex: Treating RN: Nov 16, 1935 (87 y.o. [name] Primary Care Provider: Merri [name] Other Clinician: Referring Provider: Treating Provider/Extender: [name] Weeks in Treatment: 4 Debridement Performed for Assessment: Wound #8 Left,Lateral Lower Leg Performed By: Physician [name] Guess, MD Debridement Type: Debridement Level of Consciousness (Pre-procedure): Awake and Alert Pre-procedure Verification/Time Out Yes - 14:45 Taken: Start Time: 14:47 Pain Control: Lidocaine 4% T opical Solution Percent of Wound Bed Debrided: 100% T Area Debrided (cm): otal 2 Tissue and other material debrided: Non-Viable, Slough, Slough Level: Non-Viable Tissue Debridement Description: Selective/Open Wound Instrument: Curette Bleeding: Minimum Hemostasis Achieved: Pressure Procedural Pain: 0 Post Procedural Pain: 0 Response to Treatment: Procedure was tolerated well Level of Consciousness (Post- Awake and Alert procedure): Post Debridement Measurements of Total Wound Length: (cm)  1.5 Width: (cm) 1.7 Depth: (cm) 0.1 Volume: (cm) 0.2 Character of Wound/Ulcer Post Debridement: Improved Post Procedure Diagnosis Same as Pre-procedure Notes : Scribed for Dr. Lady [name] by [name] Deed, RN Electronic Signature(s) Signed: 06/24/2022 4:46:59 PM By: [name] Guess MD FACS Signed: 06/24/2022 4:53:34 PM By: [name] Deed RN, BSN Entered By: [name] on 06/24/2022 14:53:50 -------------------------------------------------------------------------------- Debridement Details Patient Name: Date of Service: [name], [name] 06/24/2022 2:00 PM Medical Record Number: [id number] Patient Account Number: [id number] Date of Birth/Sex: Treating RN: 1935/11/24 (87 y.o. [name] Primary Care Provider: Merri [name] Other Clinician: Referring Provider: Treating Provider/Extender: Otho Ket, Candace Weeks in  Treatment: 4 Debridement Performed for Assessment: Wound #5R Right Calcaneus Performed By: Physician [name] Guess, MD Debridement Type: Debridement Level of Consciousness (Pre-procedure): Awake and Alert Pre-procedure Verification/Time Out Yes - 14:45 Taken: Start Time: 14:47 Pain Control: Lidocaine 4% T opical Solution Percent of Wound Bed Debrided: 100% T Area Debrided (cm): otal 0.07 Tissue and other material debrided: Non-Viable, Slough, Skin: [name] ([id number]) [id number] Page 3 of 12 Level: Skin/Epidermis Debridement Description: Selective/Open Wound Instrument: Curette Bleeding: Minimum Hemostasis Achieved: Pressure Procedural Pain: 0 Post Procedural Pain: 0 Response to Treatment: Procedure was tolerated well Level of Consciousness (Post- Awake and Alert procedure): Post Debridement Measurements of Total Wound Length: (cm) 0.3 Width: (cm) 0.3 Depth: (cm) 0.1 Volume: (cm) 0.007 Character of Wound/Ulcer Post Debridement: Improved Post Procedure  Diagnosis Same as Pre-procedure Notes : Scribed for Dr. Lady [name] by [name] Deed, RN Electronic Signature(s) Signed: 06/24/2022 4:46:59 PM By: [name] Guess MD FACS Signed: 06/24/2022 4:53:34 PM By: [name] Deed RN, BSN Entered By: [name] on 06/24/2022 14:55:56 -------------------------------------------------------------------------------- HPI Details Patient Name: Date of Service: [name], [name] 06/24/2022 2:00 PM Medical Record Number: [id number] Patient Account Number: [id number] Date of Birth/Sex: Treating RN: 10-27-1935 (87 y.o. M) Primary Care Provider: Merri [name] Other Clinician: Referring Provider: Treating Provider/Extender: [name] Weeks in Treatment: 4 History of Present Illness HPI Description: ADMISSION This is an 87 year old man with a past medical history significant for congestive heart failure, CKD stage III, Parkinson's disease, venous insufficiency, pulmonary hypertension, chronic A-fib/a flutter on Eliquis, coronary artery disease, and lymphedema. He has been followed in the vascular surgery clinic by Dr. Edilia Bo. Per Dr. Adele Dan last note, lymphedema pumps have been ordered, but he has not yet received them. He referred the patient to the wound care clinic for closer management of his lymphedema and venous ulcers. Venous reflux studies have been performed and unfortunately, the reflux is deep and therefore not amenable to laser ablation. ABIs in clinic today were noncompressible, but he had excellent Doppler signals. He has a small wound on his left anterior tibial surface with a bit of eschar present. He has another small wound on his right anterior tibial surface that is quite clean. He has a larger superficial ulcer on his right posterior calf that has a small amount of slough present. 04/07/2022: The only remaining open wounds are a small superficial lesion on his right posterior calf and a small ulcer on his left  anterior tibial surface. They are both clean without any slough or eschar accumulation. Edema control is good. 04/15/2022: His wounds are healed. READMISSION 05/24/2022 He returns today with multiple open wounds, most of which are from lack of understanding and compliance with the need to wear his juxta lite stockings throughout the day in addition to using his lymphedema pumps. He has been just using his lymphedema pumps without consistent compression garment use. He also struck his left lateral leg on a car door and has a wound related to this. He has multiple small wounds on his lateral right leg and bilateral posterior calves. He also has an ulcer on his plantar right calcaneus. There is slough and eschar on all surfaces. His edema is completely uncontrolled. 05/30/2022: He has 2 ulcers on the back of his right leg that are open and the large left lateral lower leg wound as well as the right heel ulcer. The other wounds have closed. There is slough accumulation on all surfaces, along with a little bit of eschar on  the leg wounds. There was some misunderstanding between the patient's wife and myself; she applied his juxta lite stockings over his 4-layer compression and had him using his lymphedema pumps twice a day. This resulted in a significant decrease in his swelling, but was probably more compression than indicated. I clarified that the juxta lite stockings were to be applied after his wounds had healed and he was out of our compression wraps. He should, however, continue to use his lymphedema pumps over our wraps now and his juxta lite stockings once he is healed. 06/07/2022: Under a layer of eschar and dry skin, all of the posterior leg wounds are closed. The left lateral lower leg wound has a layer of eschar and dried silver alginate present. Underneath this, the wound has closed up considerably. There is some slough on the wound surface. He has a new abrasion to his right anterior tibial  surface which they think was caused by his lymphedema pumps. The wound is limited to skin breakdown only. 06/14/2022: The only open wound is a skin tear on his lateral left lower leg. Everything else is healed. 06/24/2022: He was in the hospital for a few days after a fall and was out of compression so his legs are massive and edematous. The wound on his heel YONATAN, GUITRON [name] ([id number]) [id number] Page 4 of 12 somehow was closed out, but it is never actually been healed. He has a new wound on his right anterior tibial surface, again believed to be caused by his lymphedema pumps. The left lateral lower leg wound is smaller and more superficial. Electronic Signature(s) Signed: 06/24/2022 3:45:34 PM By: [name] Guess MD FACS Entered By: [name] on 06/24/2022 15:45:34 -------------------------------------------------------------------------------- Physical Exam Details Patient Name: Date of Service: [name], [name] 06/24/2022 2:00 PM Medical Record Number: [id number] Patient Account Number: [id number] Date of Birth/Sex: Treating RN: 1935-12-30 (87 y.o. M) Primary Care Provider: Merri [name] Other Clinician: Referring Provider: Treating Provider/Extender: Otho Ket, Candace Weeks in Treatment: 4 Constitutional . . . . no acute distress. Respiratory Normal work of breathing on room air. Notes 06/24/2022: He was in the hospital for a few days after a fall and was out of compression so his legs are massive and edematous. The wound on his heel somehow was closed out, but it is never actually been healed. He has a new wound on his right anterior tibial surface, again believed to be caused by his lymphedema pumps. The left lateral lower leg wound is smaller and more superficial. Electronic Signature(s) Signed: 06/24/2022 3:47:09 PM By: [name] Guess MD FACS Entered By: [name] on 06/24/2022  15:47:09 -------------------------------------------------------------------------------- Physician Orders Details Patient Name: Date of Service: [name], [name] 06/24/2022 2:00 PM Medical Record Number: [id number] Patient Account Number: [id number] Date of Birth/Sex: Treating RN: 02-21-1936 (87 y.o. [name] Primary Care Provider: Merri [name] Other Clinician: Referring Provider: Treating Provider/Extender: [name] Weeks in Treatment: 4 Verbal / Phone Orders: No Diagnosis Coding ICD-10 Coding Code Description [phone] Non-pressure chronic ulcer of other part of left lower leg with fat layer exposed L97.812 Non-pressure chronic ulcer of other part of right lower leg with fat layer exposed L97.412 Non-pressure chronic ulcer of right heel and midfoot with fat layer exposed N18.30 Chronic kidney disease, stage 3 unspecified I50.32 Chronic diastolic (congestive) heart failure I89.0 Lymphedema, not elsewhere classified I87.2 Venous insufficiency (chronic) (peripheral) G20.C Parkinsonism, unspecified Follow-up Appointments ppointment in 1 week. - Dr. Lady [name] Room 3 Return A Monday 5/6 @  10:30 am Anesthetic (In clinic) Topical Lidocaine 4% applied to wound bed Bathing/ Shower/ Hygiene May shower and wash wound with soap and water. Edema Control - Lymphedema / SCD / Other Geovanni, Rahming [name] ([id number]) [id number] Page 5 of 12 Bilateral Lower Extremities Lymphedema Pumps. Use Lymphedema pumps on leg(s) 2-3 times a day for 45-60 minutes. If wearing any wraps or hose, do not remove them. Continue exercising as instructed. Elevate legs to the level of the heart or above for 30 minutes daily and/or when sitting for 3-4 times a day throughout the day. - whenever sitting Avoid standing for long periods of time. Exercise regularly Moisturize legs daily. Compression stocking or Garment 30-40 mm/Hg pressure to: - juxtalites  both legs daily Wound Treatment Wound #11 - Lower Leg Wound Laterality: Right, Anterior Cleanser: Soap and Water 1 x Per Week/30 Days Discharge Instructions: May shower and wash wound with dial antibacterial soap and water prior to dressing change. Cleanser: Wound Cleanser 1 x Per Week/30 Days Discharge Instructions: Cleanse the wound with wound cleanser prior to applying a clean dressing using gauze sponges, not tissue or cotton balls. Peri-Wound Care: Sween Lotion (Moisturizing lotion) 1 x Per Week/30 Days Discharge Instructions: Apply moisturizing lotion as directed Prim Dressing: Maxorb Extra Ag+ Alginate Dressing, 2x2 (in/in) 1 x Per Week/30 Days ary Discharge Instructions: Apply to wound bed as instructed Secondary Dressing: Woven Gauze Sponge, Non-Sterile 4x4 in 1 x Per Week/30 Days Discharge Instructions: Apply over primary dressing as directed. Compression Wrap: Urgo K2, two layer compression system, regular 1 x Per Week/30 Days Discharge Instructions: Apply Urgo K2 as directed (alternative to 4 layer compression). Wound #5R - Calcaneus Wound Laterality: Right Cleanser: Soap and Water 1 x Per Week/30 Days Discharge Instructions: May shower and wash wound with dial antibacterial soap and water prior to dressing change. Cleanser: Wound Cleanser 1 x Per Week/30 Days Discharge Instructions: Cleanse the wound with wound cleanser prior to applying a clean dressing using gauze sponges, not tissue or cotton balls. Peri-Wound Care: Sween Lotion (Moisturizing lotion) 1 x Per Week/30 Days Discharge Instructions: Apply moisturizing lotion as directed Prim Dressing: Maxorb Extra Ag+ Alginate Dressing, 2x2 (in/in) 1 x Per Week/30 Days ary Discharge Instructions: Apply to wound bed as instructed Secondary Dressing: ALLEVYN Heel 4 1/2in x 5 1/2in / 10.5cm x 13.5cm 1 x Per Week/30 Days Discharge Instructions: Apply over primary dressing as directed. Secondary Dressing: Woven Gauze Sponge,  Non-Sterile 4x4 in 1 x Per Week/30 Days Discharge Instructions: Apply over primary dressing as directed. Compression Wrap: Urgo K2, two layer compression system, regular 1 x Per Week/30 Days Discharge Instructions: Apply Urgo K2 as directed (alternative to 4 layer compression). Wound #8 - Lower Leg Wound Laterality: Left, Lateral Cleanser: Soap and Water 1 x Per Week/30 Days Discharge Instructions: May shower and wash wound with dial antibacterial soap and water prior to dressing change. Cleanser: Wound Cleanser 1 x Per Week/30 Days Discharge Instructions: Cleanse the wound with wound cleanser prior to applying a clean dressing using gauze sponges, not tissue or cotton balls. Peri-Wound Care: Sween Lotion (Moisturizing lotion) 1 x Per Week/30 Days Discharge Instructions: Apply moisturizing lotion as directed Prim Dressing: Maxorb Extra Ag+ Alginate Dressing, 2x2 (in/in) 1 x Per Week/30 Days ary Discharge Instructions: Apply to wound bed as instructed Secondary Dressing: Woven Gauze Sponge, Non-Sterile 4x4 in 1 x Per Week/30 Days Discharge Instructions: Apply over primary dressing as directed. Compression Wrap: Urgo K2, two layer compression system, regular 1 x Per Week/30 Days  Discharge Instructions: Apply Urgo K2 as directed (alternative to 4 layer compression). Electronic Signature(s) Signed: 06/24/2022 4:46:59 PM By: [name] Guess MD FACS Entered By: [name] on 06/24/2022 15:48:13 [name] ([id number]) [id number] Page 6 of 12 -------------------------------------------------------------------------------- Problem List Details Patient Name: Date of Service: [name], [name] 06/24/2022 2:00 PM Medical Record Number: [id number] Patient Account Number: [id number] Date of Birth/Sex: Treating RN: Jul 30, 1935 (87 y.o. [name] Primary Care Provider: Merri [name] Other Clinician: Referring Provider: Treating Provider/Extender:  [name] Weeks in Treatment: 4 Active Problems ICD-10 Encounter Code Description Active Date MDM Diagnosis [phone] Non-pressure chronic ulcer of other part of left lower leg with fat layer exposed3/26/2024 No Yes L97.812 Non-pressure chronic ulcer of other part of right lower leg with fat layer 06/24/2022 No Yes exposed L97.412 Non-pressure chronic ulcer of right heel and midfoot with fat layer exposed 06/24/2022 No Yes N18.30 Chronic kidney disease, stage 3 unspecified 05/24/2022 No Yes I50.32 Chronic diastolic (congestive) heart failure 05/24/2022 No Yes I89.0 Lymphedema, not elsewhere classified 05/24/2022 No Yes I87.2 Venous insufficiency (chronic) (peripheral) 05/24/2022 No Yes G20.C Parkinsonism, unspecified 05/24/2022 No Yes Inactive Problems Resolved Problems ICD-10 Code Description Active Date Resolved Date L97.212 Non-pressure chronic ulcer of right calf with fat layer exposed 05/24/2022 05/24/2022 R42.706 Non-pressure chronic ulcer of left calf with fat layer exposed 05/24/2022 05/24/2022 L97.811 Non-pressure chronic ulcer of other part of right lower leg limited to breakdown of skin 06/07/2022 06/07/2022 Electronic Signature(s) Signed: 06/24/2022 3:20:54 PM By: [name] Guess MD FACS Previous Signature: 06/24/2022 3:09:10 PM Version By: [name] Guess MD FACS Previous Signature: 06/24/2022 3:08:21 PM Version By: [name] Guess MD FACS Entered By: [name] on 06/24/2022 15:20:53 [name] [name] ([id number]) [id number] Page 7 of 12 -------------------------------------------------------------------------------- Progress Note Details Patient Name: Date of Service: [name], [name] 06/24/2022 2:00 PM Medical Record Number: [id number] Patient Account Number: [id number] Date of Birth/Sex: Treating RN: [birth year] (87 y.o. M) Primary Care Provider: Merri [name] Other Clinician: Referring Provider: Treating  Provider/Extender: [name] Weeks in Treatment: 4 Subjective Chief Complaint Information obtained from Patient Patient presents for treatment of open ulcers due to venous insufficiency and lymphedema History of Present Illness (HPI) ADMISSION This is an 87 year old man with a past medical history significant for congestive heart failure, CKD stage III, Parkinson's disease, venous insufficiency, pulmonary hypertension, chronic A-fib/a flutter on Eliquis, coronary artery disease, and lymphedema. He has been followed in the vascular surgery clinic by Dr. Edilia Bo. Per Dr. Adele Dan last note, lymphedema pumps have been ordered, but he has not yet received them. He referred the patient to the wound care clinic for closer management of his lymphedema and venous ulcers. Venous reflux studies have been performed and unfortunately, the reflux is deep and therefore not amenable to laser ablation. ABIs in clinic today were noncompressible, but he had excellent Doppler signals. He has a small wound on his left anterior tibial surface with a bit of eschar present. He has another small wound on his right anterior tibial surface that is quite clean. He has a larger superficial ulcer on his right posterior calf that has a small amount of slough present. 04/07/2022: The only remaining open wounds are a small superficial lesion on his right posterior calf and a small ulcer on his left anterior tibial surface. They are both clean without any slough or eschar accumulation. Edema control is good. 04/15/2022: His wounds are healed. READMISSION 05/24/2022 He returns today with multiple open wounds, most of which are  from lack of understanding and compliance with the need to wear his juxta lite stockings throughout the day in addition to using his lymphedema pumps. He has been just using his lymphedema pumps without consistent compression garment use. He also struck his left lateral leg on a car  door and has a wound related to this. He has multiple small wounds on his lateral right leg and bilateral posterior calves. He also has an ulcer on his plantar right calcaneus. There is slough and eschar on all surfaces. His edema is completely uncontrolled. 05/30/2022: He has 2 ulcers on the back of his right leg that are open and the large left lateral lower leg wound as well as the right heel ulcer. The other wounds have closed. There is slough accumulation on all surfaces, along with a little bit of eschar on the leg wounds. There was some misunderstanding between the patient's wife and myself; she applied his juxta lite stockings over his 4-layer compression and had him using his lymphedema pumps twice a day. This resulted in a significant decrease in his swelling, but was probably more compression than indicated. I clarified that the juxta lite stockings were to be applied after his wounds had healed and he was out of our compression wraps. He should, however, continue to use his lymphedema pumps over our wraps now and his juxta lite stockings once he is healed. 06/07/2022: Under a layer of eschar and dry skin, all of the posterior leg wounds are closed. The left lateral lower leg wound has a layer of eschar and dried silver alginate present. Underneath this, the wound has closed up considerably. There is some slough on the wound surface. He has a new abrasion to his right anterior tibial surface which they think was caused by his lymphedema pumps. The wound is limited to skin breakdown only. 06/14/2022: The only open wound is a skin tear on his lateral left lower leg. Everything else is healed. 06/24/2022: He was in the hospital for a few days after a fall and was out of compression so his legs are massive and edematous. The wound on his heel somehow was closed out, but it is never actually been healed. He has a new wound on his right anterior tibial surface, again believed to be caused by  his lymphedema pumps. The left lateral lower leg wound is smaller and more superficial. Patient History Information obtained from Patient, Caregiver, Chart. Family History Heart Disease - Father, Stroke, No family history of Cancer, Diabetes, Hereditary Spherocytosis, Hypertension, Kidney Disease, Lung Disease, Seizures, Thyroid Problems, Tuberculosis. Social History Never [name], Marital Status - Married, Alcohol Use - Daily - 2 cocktails per day, Drug Use - No History, Caffeine Use - Daily. Medical History Eyes Patient has history of Cataracts - bil extractions Denies history of Glaucoma, Optic Neuritis Hematologic/Lymphatic Patient has history of Lymphedema - lower legs Cardiovascular Patient has history of Arrhythmia - afib, Congestive Heart Failure, Coronary Artery Disease, Hypertension Endocrine Denies history of Type I Diabetes, Type II Diabetes Genitourinary Denies history of End Stage Renal Disease Integumentary (Skin) Denies history of History of Burn Musculoskeletal Patient has history of Osteoarthritis - both knees Denies history of Gout, Rheumatoid Arthritis, Osteomyelitis Oncologic Patient has history of Received Radiation - seed implants Denies history of Received Chemotherapy Psychiatric [name], [name] ([id number]) [phone].pdf Page 8 of 12 Patient has history of Confinement Anxiety Denies history of Anorexia/bulimia Hospitalization/Surgery History - left femur IM nail. - right shoulder melanoma excision. - cardioversion. - hemorrhoid  surgery. - cardiac anfioplasty. - coronary atherectomy. - bil cataract extraction. - insertion prostate radiation seed. - lap cholecystectomy. - inguinal hernia repair. Medical A Surgical History Notes nd Respiratory elevated hemidiaphragm Cardiovascular pulmonary hypertension, mitral valve regurgitation, dilated cardiomyopathy Genitourinary CKD stage 3 Neurologic parkinson's Oncologic skin CA  removed, prostate CA Objective Constitutional no acute distress. Vitals Time Taken: 2:09 AM, Height: 71 in, Weight: 190 lbs, BMI: 26.5, Temperature: 97.7 F, Pulse: 60 bpm, Respiratory Rate: 18 breaths/min, Blood Pressure: 127/67 mmHg. Respiratory Normal work of breathing on room air. General Notes: 06/24/2022: He was in the hospital for a few days after a fall and was out of compression so his legs are massive and edematous. The wound on his heel somehow was closed out, but it is never actually been healed. He has a new wound on his right anterior tibial surface, again believed to be caused by his lymphedema pumps. The left lateral lower leg wound is smaller and more superficial. Integumentary (Hair, Skin) Wound #11 status is Open. Original cause of wound was Not Known. The date acquired was: 06/10/2022. The wound is located on the Right,Anterior Lower Leg. The wound measures 3cm length x 1.7cm width x 0.1cm depth; 4.006cm^2 area and 0.401cm^3 volume. There is Fat Layer (Subcutaneous Tissue) exposed. There is no tunneling or undermining noted. There is a medium amount of serosanguineous drainage noted. The wound margin is flat and intact. There is large (67-100%) red granulation within the wound bed. There is a small (1-33%) amount of necrotic tissue within the wound bed including Eschar. The periwound skin appearance had no abnormalities noted for texture. The periwound skin appearance had no abnormalities noted for moisture. The periwound skin appearance exhibited: Hemosiderin Staining. Periwound temperature was noted as No Abnormality. Wound #5R status is Open. Original cause of wound was Shear/Friction. The date acquired was: 05/24/2022. The wound has been in treatment 4 weeks. The wound is located on the Right Calcaneus. The wound measures 0.3cm length x 0.3cm width x 0.3cm depth; 0.071cm^2 area and 0.021cm^3 volume. There is no tunneling or undermining noted. There is a none present amount  of drainage noted. The wound margin is flat and intact. There is no granulation within the wound bed. There is no necrotic tissue within the wound bed. The periwound skin appearance had no abnormalities noted for moisture. The periwound skin appearance had no abnormalities noted for color. The periwound skin appearance exhibited: Callus. Periwound temperature was noted as No Abnormality. The periwound has tenderness on palpation. Wound #8 status is Open. Original cause of wound was Trauma. The date acquired was: 05/09/2022. The wound has been in treatment 4 weeks. The wound is located on the Left,Lateral Lower Leg. The wound measures 1.5cm length x 1.7cm width x 0.1cm depth; 2.003cm^2 area and 0.2cm^3 volume. There is Fat Layer (Subcutaneous Tissue) exposed. There is no tunneling or undermining noted. There is a medium amount of serosanguineous drainage noted. The wound margin is distinct with the outline attached to the wound base. There is large (67-100%) red granulation within the wound bed. There is a small (1-33%) amount of necrotic tissue within the wound bed including Adherent Slough. The periwound skin appearance had no abnormalities noted for texture. The periwound skin appearance had no abnormalities noted for moisture. The periwound skin appearance exhibited: Hemosiderin Staining. The periwound skin appearance did not exhibit: Atrophie Blanche, Cyanosis, Ecchymosis, Mottled, Pallor, Rubor, Erythema. Periwound temperature was noted as No Abnormality. The periwound has tenderness on palpation. Assessment Active Problems ICD-10 Non-pressure  chronic ulcer of other part of left lower leg with fat layer exposed Non-pressure chronic ulcer of other part of right lower leg with fat layer exposed Non-pressure chronic ulcer of right heel and midfoot with fat layer exposed Chronic kidney disease, stage 3 unspecified Chronic diastolic (congestive) heart failure Lymphedema, not elsewhere  classified Venous insufficiency (chronic) (peripheral) Parkinsonism, unspecified [name], [name] ([id number]) [id number] Page 9 of 12 Procedures Wound #11 Pre-procedure diagnosis of Wound #11 is a Lymphedema located on the Right,Anterior Lower Leg . There was a Selective/Open Wound Non-Viable Tissue Debridement with a total area of 4 sq cm performed by [name] Guess, MD. With the following instrument(s): Curette to remove Non-Viable tissue/material. Material removed includes [facility] after achieving pain control using Lidocaine 4% Topical Solution. No specimens were taken. A time out was conducted at 14:45, prior to the start of the procedure. A Minimum amount of bleeding was controlled with Pressure. The procedure was tolerated well with a pain level of 0 throughout and a pain level of 0 following the procedure. Post Debridement Measurements: 3cm length x 1.7cm width x 0.1cm depth; 0.401cm^3 volume. Character of Wound/Ulcer Post Debridement is improved. Post procedure Diagnosis Wound #11: Same as Pre-Procedure General Notes: : Scribed for Dr. Lady [name] by [name] Deed, RN. Wound #5R Pre-procedure diagnosis of Wound #5R is a Lymphedema located on the Right Calcaneus . There was a Selective/Open Wound Skin/Epidermis Debridement with a total area of 0.07 sq cm performed by [name] Guess, MD. With the following instrument(s): Curette to remove Non-Viable tissue/material. Material removed includes [facility] and Skin: Epidermis and after achieving pain control using Lidocaine 4% T opical Solution. No specimens were taken. A time out was conducted at 14:45, prior to the start of the procedure. A Minimum amount of bleeding was controlled with Pressure. The procedure was tolerated well with a pain level of 0 throughout and a pain level of 0 following the procedure. Post Debridement Measurements: 0.3cm length x 0.3cm width x 0.1cm depth; 0.007cm^3 volume. Character of  Wound/Ulcer Post Debridement is improved. Post procedure Diagnosis Wound #5R: Same as Pre-Procedure General Notes: : Scribed for Dr. Lady [name] by [name] Deed, RN. Wound #8 Pre-procedure diagnosis of Wound #8 is a Lymphedema located on the Left,Lateral Lower Leg . There was a Selective/Open Wound Non-Viable Tissue Debridement with a total area of 2 sq cm performed by [name] Guess, MD. With the following instrument(s): Curette to remove Non-Viable tissue/material. Material removed includes [facility] after achieving pain control using Lidocaine 4% Topical Solution. No specimens were taken. A time out was conducted at 14:45, prior to the start of the procedure. A Minimum amount of bleeding was controlled with Pressure. The procedure was tolerated well with a pain level of 0 throughout and a pain level of 0 following the procedure. Post Debridement Measurements: 1.5cm length x 1.7cm width x 0.1cm depth; 0.2cm^3 volume. Character of Wound/Ulcer Post Debridement is improved. Post procedure Diagnosis Wound #8: Same as Pre-Procedure General Notes: : Scribed for Dr. Lady [name] by [name] Deed, RN. Plan Follow-up Appointments: Return Appointment in 1 week. - Dr. Lady [name] Room 3 Monday 5/6 @ 10:30 am Anesthetic: (In clinic) Topical Lidocaine 4% applied to wound bed Bathing/ Shower/ Hygiene: May shower and wash wound with soap and water. Edema Control - Lymphedema / SCD / Other: Lymphedema Pumps. Use Lymphedema pumps on leg(s) 2-3 times a day for 45-60 minutes. If wearing any wraps or hose, do not remove them. Continue exercising as instructed. Elevate legs to the level of the heart  or above for 30 minutes daily and/or when sitting for 3-4 times a day throughout the day. - whenever sitting Avoid standing for long periods of time. Exercise regularly Moisturize legs daily. Compression stocking or Garment 30-40 mm/Hg pressure to: - juxtalites both legs daily WOUND #11: - Lower Leg Wound Laterality: Right,  Anterior Cleanser: Soap and Water 1 x Per Week/30 Days Discharge Instructions: May shower and wash wound with dial antibacterial soap and water prior to dressing change. Cleanser: Wound Cleanser 1 x Per Week/30 Days Discharge Instructions: Cleanse the wound with wound cleanser prior to applying a clean dressing using gauze sponges, not tissue or cotton balls. Peri-Wound Care: Sween Lotion (Moisturizing lotion) 1 x Per Week/30 Days Discharge Instructions: Apply moisturizing lotion as directed Prim Dressing: Maxorb Extra Ag+ Alginate Dressing, 2x2 (in/in) 1 x Per Week/30 Days ary Discharge Instructions: Apply to wound bed as instructed Secondary Dressing: Woven Gauze Sponge, Non-Sterile 4x4 in 1 x Per Week/30 Days Discharge Instructions: Apply over primary dressing as directed. Com pression Wrap: Urgo K2, two layer compression system, regular 1 x Per Week/30 Days Discharge Instructions: Apply Urgo K2 as directed (alternative to 4 layer compression). WOUND #5R: - Calcaneus Wound Laterality: Right Cleanser: Soap and Water 1 x Per Week/30 Days Discharge Instructions: May shower and wash wound with dial antibacterial soap and water prior to dressing change. Cleanser: Wound Cleanser 1 x Per Week/30 Days Discharge Instructions: Cleanse the wound with wound cleanser prior to applying a clean dressing using gauze sponges, not tissue or cotton balls. Peri-Wound Care: Sween Lotion (Moisturizing lotion) 1 x Per Week/30 Days Discharge Instructions: Apply moisturizing lotion as directed Prim Dressing: Maxorb Extra Ag+ Alginate Dressing, 2x2 (in/in) 1 x Per Week/30 Days ary Discharge Instructions: Apply to wound bed as instructed Secondary Dressing: ALLEVYN Heel 4 1/2in x 5 1/2in / 10.5cm x 13.5cm 1 x Per Week/30 Days Discharge Instructions: Apply over primary dressing as directed. Secondary Dressing: Woven Gauze Sponge, Non-Sterile 4x4 in 1 x Per Week/30 Days Discharge Instructions: Apply over primary  dressing as directed. Com pression Wrap: Urgo K2, two layer compression system, regular 1 x Per Week/30 Days Discharge Instructions: Apply Urgo K2 as directed (alternative to 4 layer compression). WOUND #8: - Lower Leg Wound Laterality: Left, Lateral Cleanser: Soap and Water 1 x Per Week/30 Days Discharge Instructions: May shower and wash wound with dial antibacterial soap and water prior to dressing change. Cleanser: Wound Cleanser 1 x Per Week/30 Days Discharge Instructions: Cleanse the wound with wound cleanser prior to applying a clean dressing using gauze sponges, not tissue or cotton balls. Peri-Wound Care: Sween Lotion (Moisturizing lotion) 1 x Per Week/30 Days Discharge Instructions: Apply moisturizing lotion as directed Prim Dressing: Maxorb Extra Ag+ Alginate Dressing, 2x2 (in/in) 1 x Per Week/30 Days [name], [name] ([id number]) [phone].pdf Page 10 of 12 Discharge Instructions: Apply to wound bed as instructed Secondary Dressing: Woven Gauze Sponge, Non-Sterile 4x4 in 1 x Per Week/30 Days Discharge Instructions: Apply over primary dressing as directed. Compression Wrap: Urgo K2, two layer compression system, regular 1 x Per Week/30 Days Discharge Instructions: Apply Urgo K2 as directed (alternative to 4 layer compression). 06/24/2022: He was in the hospital for a few days after a fall and was out of compression so his legs are massive and edematous. The wound on his heel somehow was closed out, but it is never actually been healed. He has a new wound on his right anterior tibial surface, again believed to be caused by his  lymphedema pumps. The left lateral lower leg wound is smaller and more superficial. I used a curette to debride slough from the anterior tibial wound and left lateral lower leg wound, as well as the calcaneal wound. We will apply silver alginate to all sites. Will use a heel cup and heel shoe for the calcaneal wound. Bilateral  4-layer compression equivalent. Due to his fall, they were not able to meet with the lymphedema pump rep to address the issue with what ever is causing the skin tears to occur; they are going to contact the rep and set up another meeting. Follow-up in 1 week. Electronic Signature(s) Signed: 06/24/2022 3:49:40 PM By: [name] Guess MD FACS Entered By: [name] on 06/24/2022 15:49:40 -------------------------------------------------------------------------------- HxROS Details Patient Name: Date of Service: [name], Gerell [name]. 06/24/2022 2:00 PM Medical Record Number: [id number] Patient Account Number: [id number] Date of Birth/Sex: Treating RN: 02-Jun-1935 (87 y.o. M) Primary Care Provider: Merri [name] Other Clinician: Referring Provider: Treating Provider/Extender: [name] Weeks in Treatment: 4 Information Obtained From Patient Caregiver Chart Eyes Medical History: Positive for: Cataracts - bil extractions Negative for: Glaucoma; Optic Neuritis Hematologic/Lymphatic Medical History: Positive for: Lymphedema - lower legs Respiratory Medical History: Past Medical History Notes: elevated hemidiaphragm Cardiovascular Medical History: Positive for: Arrhythmia - afib; Congestive Heart Failure; Coronary Artery Disease; Hypertension Past Medical History Notes: pulmonary hypertension, mitral valve regurgitation, dilated cardiomyopathy Endocrine Medical History: Negative for: Type I Diabetes; Type II Diabetes Genitourinary Medical History: Negative for: End Stage Renal Disease Past Medical History Notes: CKD stage 3 Integumentary (Skin) Medical History: Negative for: History of Burn [name], [name] ([id number]) [phone].pdf Page 11 of 12 Musculoskeletal Medical History: Positive for: Osteoarthritis - both knees Negative for: Gout; Rheumatoid Arthritis; Osteomyelitis Neurologic Medical History: Past Medical  History Notes: parkinson's Oncologic Medical History: Positive for: Received Radiation - seed implants Negative for: Received Chemotherapy Past Medical History Notes: skin CA removed, prostate CA Psychiatric Medical History: Positive for: Confinement Anxiety Negative for: Anorexia/bulimia HBO Extended History Items Eyes: Cataracts Immunizations Pneumococcal Vaccine: Received Pneumococcal Vaccination: Yes Received Pneumococcal Vaccination On or After 60th Birthday: Yes Implantable Devices No devices added Hospitalization / Surgery History Type of Hospitalization/Surgery left femur IM nail right shoulder melanoma excision cardioversion hemorrhoid surgery cardiac anfioplasty coronary atherectomy bil cataract extraction insertion prostate radiation seed lap cholecystectomy inguinal hernia repair Family and Social History Cancer: No; Diabetes: No; Heart Disease: Yes - Father; Hereditary Spherocytosis: No; Hypertension: No; Kidney Disease: No; Lung Disease: No; Seizures: No; Stroke: Yes; Thyroid Problems: No; Tuberculosis: No; Never [name]; Marital Status - Married; Alcohol Use: Daily - 2 cocktails per day; Drug Use: No History; Caffeine Use: Daily; Financial Concerns: No; Food, Clothing or Shelter Needs: No; Support System Lacking: No; Transportation Concerns: No Electronic Signature(s) Signed: 06/24/2022 4:46:59 PM By: [name] Guess MD FACS Entered By: [name] on 06/24/2022 15:46:40 -------------------------------------------------------------------------------- SuperBill Details Patient Name: Date of Service: [name], Alfonzo [name]. 06/24/2022 Medical Record Number: [id number] Patient Account Number: [id number] Date of Birth/Sex: Treating RN: May 02, 1935 (87 y.o. M) Primary Care Provider: Merri [name] Other Clinician: Referring Provider: Treating Provider/Extender: [name] Weeks in Treatment: 4 Diagnosis Coding [name], SCHEURING [name]  ([id number]) [id number] Page 12 of 12 ICD-10 Codes Code Description [phone] Non-pressure chronic ulcer of other part of left lower leg with fat layer exposed L97.812 Non-pressure chronic ulcer of other part of right lower leg with fat layer exposed L97.412 Non-pressure chronic ulcer of right heel and midfoot with fat layer exposed N18.30  Chronic kidney disease, stage 3 unspecified I50.32 Chronic diastolic (congestive) heart failure I89.0 Lymphedema, not elsewhere classified I87.2 Venous insufficiency (chronic) (peripheral) G20.C Parkinsonism, unspecified Facility Procedures : CPT4 Code: [id number] Description: [id number] - DEBRIDE WOUND 1ST 20 SQ CM OR < ICD-10 Diagnosis Description L97.822 Non-pressure chronic ulcer of other part of left lower leg with fat layer expose L97.812 Non-pressure chronic ulcer of other part of right lower leg with fat layer  expos L97.412 Non-pressure chronic ulcer of right heel and midfoot with fat layer exposed Modifier: d ed Quantity: 1 Physician Procedures : CPT4 Code Description Modifier [id number] - WC PHYS LEVEL 4 - EST PT 25 ICD-10 Diagnosis Description L97.822 Non-pressure chronic ulcer of other part of left lower leg with fat layer exposed L97.812 Non-pressure chronic ulcer of other part of right  lower leg with fat layer exposed L97.412 Non-pressure chronic ulcer of right heel and midfoot with fat layer exposed I89.0 Lymphedema, not elsewhere classified Quantity: 1 : [id number] - WC PHYS DEBR WO ANESTH 20 SQ CM ICD-10 Diagnosis Description L97.822 Non-pressure chronic ulcer of other part of left lower leg with fat layer exposed L97.812 Non-pressure chronic ulcer of other part of right lower leg with fat layer  exposed L97.412 Non-pressure chronic ulcer of right heel and midfoot with fat layer exposed Quantity: 1 Electronic Signature(s) Signed: 06/24/2022 3:50:04 PM By: [name] Guess MD FACS Entered By: [name] on  06/24/2022 15:50:03

## 2022-06-28 ENCOUNTER — Other Ambulatory Visit: Payer: Self-pay

## 2022-06-28 ENCOUNTER — Telehealth: Payer: Self-pay

## 2022-06-28 DIAGNOSIS — N179 Acute kidney failure, unspecified: Secondary | ICD-10-CM | POA: Diagnosis not present

## 2022-06-28 DIAGNOSIS — I5032 Chronic diastolic (congestive) heart failure: Secondary | ICD-10-CM | POA: Diagnosis not present

## 2022-06-28 DIAGNOSIS — S81802D Unspecified open wound, left lower leg, subsequent encounter: Secondary | ICD-10-CM | POA: Diagnosis not present

## 2022-06-28 DIAGNOSIS — S81801D Unspecified open wound, right lower leg, subsequent encounter: Secondary | ICD-10-CM | POA: Diagnosis not present

## 2022-06-28 DIAGNOSIS — I890 Lymphedema, not elsewhere classified: Secondary | ICD-10-CM | POA: Diagnosis not present

## 2022-06-28 DIAGNOSIS — I130 Hypertensive heart and chronic kidney disease with heart failure and stage 1 through stage 4 chronic kidney disease, or unspecified chronic kidney disease: Secondary | ICD-10-CM | POA: Diagnosis not present

## 2022-06-28 NOTE — Telephone Encounter (Signed)
[facility] nurse is calling to ask how you want the patient to take his Lasix and Potassium. Nurse says hospital directions are Lasix 40mg  bid and potassium 2 tabs bid, should he be taking these medications as the hospital directed or take them as you have directed?

## 2022-06-28 NOTE — Telephone Encounter (Signed)
Patient wife called and does not think the Lasix and Potassium rx direction's from the hospital are NOT correct, so she does not want to go by what Dr. Rosemary Holms recommended in previous message 06/28/22.  She is giving Lasix at 8am, 11am, 4pm. She is giving Potassium at 7am, 11am and 4pm.   She is very confused about the dosages she should be giving. States she will continue giving Lasix and potassium as stated above. until she hears from [place]. She was informed that Dr. Rosemary Holms was out of the office until Monday. She would like a answer before then.

## 2022-06-28 NOTE — Telephone Encounter (Signed)
He had very low potassium levels during recent hospitalzation. Recommend following hospital directions for now. I have f/u w/him in January. Can see him sooner if needed. Please order a BMP to be done in 2 weeks.  Thanks MJP

## 2022-06-28 NOTE — Telephone Encounter (Signed)
Spoke with [name] nurse Archie Patten gave Dr.Patwardhan's instructions. She verbalized understanding.

## 2022-06-29 NOTE — ED Provider Notes (Signed)
[facility] 3E HF PCU Provider Note   CSN: [id number] Arrival date & time: 06/21/22  0516     History  Chief Complaint  Patient presents with   [name] is a 87 y.o. male.  Patient with an episode today where he fell. May have syncopized as well. Story not clear as patient usually sleeps in a recliner and wife actually found him in the yard tonight with abrasions. Patient doesn't recall the event. No recent illnesses. Normal PO intake recently. No pain currently and feels at abaseline.    Fall       Home Medications Prior to Admission medications   Medication Sig Start Date End Date Taking? Authorizing Provider  acetaminophen (TYLENOL) 325 MG tablet Take 1-2 tablets (325-650 mg total) by mouth every 6 (six) hours as needed for mild pain (pain score 1-3 or temp > 100.5). 05/21/20  Yes Darrick Grinder, PA-C  apixaban (ELIQUIS) 2.5 MG TABS tablet Take 1 tablet (2.5 mg total) by mouth 2 (two) times daily. (0800 & 2000) Patient taking differently: Take 2.5 mg by mouth 2 (two) times daily. 01/26/21  Yes Patwardhan, Manish J, MD  carbidopa-levodopa (SINEMET IR) 25-100 MG tablet TAKE 2 TABLETS THREE TIMES DAILY AT 7:00AM, 11:00AM AND 4:00PM Patient taking differently: Take 2 tablets by mouth 3 (three) times daily. 04/18/22  Yes Tat, Octaviano Batty, DO  Cholecalciferol (VITAMIN D-3) 1000 units CAPS Take 1,000 Units by mouth daily. (0800)   Yes [provider]  dapagliflozin propanediol (FARXIGA) 10 MG TABS tablet Take 1 tablet by mouth daily.   Yes [provider]  diltiazem (CARDIZEM CD) 120 MG 24 hr capsule TAKE 1 CAPSULE EVERY DAY Patient taking differently: Take 120 mg by mouth daily. 01/31/22  Yes Patwardhan, Manish J, MD  furosemide (LASIX) 40 MG tablet Take 1 tablet (40 mg total) by mouth 2 (two) times daily. Patient taking differently: Take 80 mg by mouth 3 (three) times daily. 12/24/21 06/21/22 Yes Nori Riis, NP  gabapentin  (NEURONTIN) 300 MG capsule Take 1 capsule (300 mg total) by mouth 3 (three) times daily. 05/05/20  Yes Tat, Octaviano Batty, DO  metolazone (ZAROXOLYN) 2.5 MG tablet TAKE 1 TABLET (2.5 MG TOTAL) BY MOUTH AS DIRECTED TWICE A WEEK Patient taking differently: Take 2.5 mg by mouth See admin instructions. Take 2.5 mg (1 tablet) by mouth twice a week on Sunday's and Wednesday's. 10/13/21  Yes Patwardhan, Manish J, MD  Multiple Vitamins-Minerals (PRESERVISION AREDS PO) Take 1 tablet by mouth in the morning and at bedtime.   Yes [provider]  nitroGLYCERIN (NITROSTAT) 0.4 MG SL tablet TAKE 1 TABLET EVERY 5 MINUTES AS NEEDED FOR CHEST PAIN Patient taking differently: Place 0.4 mg under the tongue every 5 (five) minutes as needed for chest pain. 08/18/21  Yes Yates Decamp, MD  potassium chloride (KLOR-CON) 10 MEQ tablet Take 2 tablets (20 mEq total) by mouth 2 (two) times daily. Patient taking differently: Take 40 mEq by mouth 3 (three) times daily. 10/26/20  Yes Patwardhan, Manish J, MD  rosuvastatin (CRESTOR) 10 MG tablet Take 10 mg by mouth daily at 8 pm. (2000)   Yes [provider]      Allergies    Nsaids    Review of Systems   Review of Systems  Physical Exam Updated Vital Signs BP (!) 112/99 (BP Location: Right Arm)   Pulse 84   Temp 98.2 F (36.8 C) (Oral)   Resp 18  Ht 5\' 10"  (1.778 m)   Wt 89.4 kg   SpO2 98%   BMI 28.28 kg/m  Physical Exam Vitals and nursing note reviewed.  Constitutional:      Appearance: He is well-developed.  HENT:     Head: Normocephalic and atraumatic.  Cardiovascular:     Rate and Rhythm: Normal rate.  Pulmonary:     Effort: Pulmonary effort is normal. No respiratory distress.  Abdominal:     General: There is no distension.  Musculoskeletal:        General: Normal range of motion.     Cervical back: Normal range of motion.  Skin:    Comments: Abrasions on arms.  Neurological:     Mental Status: He is alert. Mental status is at baseline.      ED Results / Procedures / Treatments   Labs (all labs ordered are listed, but only abnormal results are displayed) Labs Reviewed  CBC WITH DIFFERENTIAL/PLATELET - Abnormal; Notable for the following components:      Result Value   RDW 16.4 (*)    All other components within normal limits  COMPREHENSIVE METABOLIC PANEL - Abnormal; Notable for the following components:   Chloride 91 (*)    Glucose, Bld 102 (*)    BUN 45 (*)    Creatinine, Ser 2.11 (*)    Alkaline Phosphatase 140 (*)    Total Bilirubin 2.5 (*)    GFR, Estimated 30 (*)    Anion gap 16 (*)    All other components within normal limits  URINALYSIS, ROUTINE W REFLEX MICROSCOPIC - Abnormal; Notable for the following components:   Glucose, UA 50 (*)    Protein, ur 30 (*)    Bacteria, UA RARE (*)    All other components within normal limits  BRAIN NATRIURETIC PEPTIDE - Abnormal; Notable for the following components:   B Natriuretic Peptide 216.6 (*)    All other components within normal limits  CBC - Abnormal; Notable for the following components:   RBC 4.04 (*)    Hemoglobin 11.8 (*)    HCT 36.2 (*)    RDW 16.5 (*)    All other components within normal limits  BASIC METABOLIC PANEL - Abnormal; Notable for the following components:   Potassium 2.6 (*)    Chloride 92 (*)    Glucose, Bld 110 (*)    BUN 46 (*)    Creatinine, Ser 2.07 (*)    Calcium 8.7 (*)    GFR, Estimated 31 (*)    All other components within normal limits  POTASSIUM - Abnormal; Notable for the following components:   Potassium 3.3 (*)    All other components within normal limits  TROPONIN I (HIGH SENSITIVITY) - Abnormal; Notable for the following components:   Troponin I (High Sensitivity) 19 (*)    All other components within normal limits  TROPONIN I (HIGH SENSITIVITY) - Abnormal; Notable for the following components:   Troponin I (High Sensitivity) 20 (*)    All other components within normal limits  MAGNESIUM    EKG EKG  Interpretation  Date/Time:  Tuesday June 21 2022 05:26:02 EDT Ventricular Rate:  98 PR Interval:    QRS Duration: 152 QT Interval:  406 QTC Calculation: 518 R Axis:   -27 Text Interpretation: Atrial fibrillation with premature ventricular or aberrantly conducted complexes Right bundle branch block Abnormal ECG When compared with ECG of 22-Feb-2022 17:15, PREVIOUS ECG IS PRESENT Confirmed by Marily Memos [phone]) on 06/21/2022 7:05:40 AM  Radiology No results found.  Procedures Procedures    Medications Ordered in ED Medications  potassium chloride SA (KLOR-CON M) CR tablet 40 mEq (40 mEq Oral Given 06/22/22 1225)    ED Course/ Medical Decision Making/ A&P                             Medical Decision Making Amount and/or Complexity of Data Reviewed Labs: ordered. Radiology: ordered. ECG/medicine tests: ordered.  Risk Decision regarding hospitalization.   Labs c/w mild dehydration. Troponin slightly elevated but in the setting of elevated creatinine as well, will repeat, no ecg change to suggest ischemia. K low, will replete.  Patient at baseline. Care transferred pending second troponin and reevaluation for disposition.    Final Clinical Impression(s) / ED Diagnoses Final diagnoses:  Syncope and collapse  AKI (acute kidney injury) (HCC)    Rx / DC Orders ED Discharge Orders          Ordered    Increase activity slowly        06/22/22 1452    Diet - low sodium heart healthy        06/22/22 1452    Discharge instructions       Comments: Start taking your water pill tomorrow only. Take all other medicines today including potassium   06/22/22 1452    Discharge wound care:       Comments: Change dressings Q M/W/F as follows: 1. Cut piece of alginate [place] # [phone]) and apply to left leg wound, then cover with foam dressing. Change foam dressing Q 3 days or PRN soiling. Moisten previous dressing to assist with removal. 2. Apply a piece of Xeroform gauze to right  leg, then cover with foam dressing. Change foam dressing Q 3 days or PRN soiling.   06/22/22 1452              Rawlin Reaume, Barbara Cower, MD 06/29/22 2328

## 2022-06-29 NOTE — Telephone Encounter (Signed)
She should follow his instructions

## 2022-06-29 NOTE — Telephone Encounter (Signed)
Tried to call wife back on home phone it was busy. Tried cell phone and call would not go through.

## 2022-06-30 ENCOUNTER — Telehealth: Payer: Self-pay

## 2022-06-30 DIAGNOSIS — N179 Acute kidney failure, unspecified: Secondary | ICD-10-CM | POA: Diagnosis not present

## 2022-06-30 DIAGNOSIS — E876 Hypokalemia: Secondary | ICD-10-CM | POA: Diagnosis not present

## 2022-06-30 DIAGNOSIS — I130 Hypertensive heart and chronic kidney disease with heart failure and stage 1 through stage 4 chronic kidney disease, or unspecified chronic kidney disease: Secondary | ICD-10-CM | POA: Diagnosis not present

## 2022-06-30 DIAGNOSIS — Z9181 History of falling: Secondary | ICD-10-CM | POA: Diagnosis not present

## 2022-06-30 DIAGNOSIS — S81801D Unspecified open wound, right lower leg, subsequent encounter: Secondary | ICD-10-CM | POA: Diagnosis not present

## 2022-06-30 DIAGNOSIS — S81802D Unspecified open wound, left lower leg, subsequent encounter: Secondary | ICD-10-CM | POA: Diagnosis not present

## 2022-06-30 DIAGNOSIS — I890 Lymphedema, not elsewhere classified: Secondary | ICD-10-CM | POA: Diagnosis not present

## 2022-06-30 DIAGNOSIS — I5032 Chronic diastolic (congestive) heart failure: Secondary | ICD-10-CM | POA: Diagnosis not present

## 2022-06-30 DIAGNOSIS — Z09 Encounter for follow-up examination after completed treatment for conditions other than malignant neoplasm: Secondary | ICD-10-CM | POA: Diagnosis not present

## 2022-06-30 NOTE — Telephone Encounter (Signed)
Bayda calling to let us know the patient is taking KLOR-CON- 120 Meq a day LASIX- 160 mg in the am and 80 in the pm. This is different than what we last sent for the patient so she wanted to update [place] on what the patient reported to them. She was aware this was too much of each and is trying to figure out what office sent in this dosage for the patient as this is also what was listed on the med bottle.

## 2022-07-01 ENCOUNTER — Other Ambulatory Visit: Payer: Self-pay | Admitting: Neurology

## 2022-07-01 DIAGNOSIS — S81801D Unspecified open wound, right lower leg, subsequent encounter: Secondary | ICD-10-CM | POA: Diagnosis not present

## 2022-07-01 DIAGNOSIS — I130 Hypertensive heart and chronic kidney disease with heart failure and stage 1 through stage 4 chronic kidney disease, or unspecified chronic kidney disease: Secondary | ICD-10-CM | POA: Diagnosis not present

## 2022-07-01 DIAGNOSIS — N179 Acute kidney failure, unspecified: Secondary | ICD-10-CM | POA: Diagnosis not present

## 2022-07-01 DIAGNOSIS — G20A1 Parkinson's disease without dyskinesia, without mention of fluctuations: Secondary | ICD-10-CM

## 2022-07-01 DIAGNOSIS — I890 Lymphedema, not elsewhere classified: Secondary | ICD-10-CM | POA: Diagnosis not present

## 2022-07-01 DIAGNOSIS — S81802D Unspecified open wound, left lower leg, subsequent encounter: Secondary | ICD-10-CM | POA: Diagnosis not present

## 2022-07-01 DIAGNOSIS — I5032 Chronic diastolic (congestive) heart failure: Secondary | ICD-10-CM | POA: Diagnosis not present

## 2022-07-02 NOTE — Telephone Encounter (Signed)
I have not seen the patient. The change was made from hospital discharge. Can I see the patient sometime soon?  Thanks MJP

## 2022-07-04 ENCOUNTER — Encounter (HOSPITAL_BASED_OUTPATIENT_CLINIC_OR_DEPARTMENT_OTHER): Payer: Medicare Other | Admitting: General Surgery

## 2022-07-04 DIAGNOSIS — Z85820 Personal history of malignant melanoma of skin: Secondary | ICD-10-CM | POA: Diagnosis not present

## 2022-07-04 DIAGNOSIS — C44319 Basal cell carcinoma of skin of other parts of face: Secondary | ICD-10-CM | POA: Diagnosis not present

## 2022-07-04 DIAGNOSIS — Z85828 Personal history of other malignant neoplasm of skin: Secondary | ICD-10-CM | POA: Diagnosis not present

## 2022-07-05 ENCOUNTER — Encounter (HOSPITAL_BASED_OUTPATIENT_CLINIC_OR_DEPARTMENT_OTHER): Payer: Medicare Other | Attending: General Surgery | Admitting: General Surgery

## 2022-07-05 ENCOUNTER — Encounter: Payer: Self-pay | Admitting: Cardiology

## 2022-07-05 DIAGNOSIS — G20A1 Parkinson's disease without dyskinesia, without mention of fluctuations: Secondary | ICD-10-CM | POA: Insufficient documentation

## 2022-07-05 DIAGNOSIS — I130 Hypertensive heart and chronic kidney disease with heart failure and stage 1 through stage 4 chronic kidney disease, or unspecified chronic kidney disease: Secondary | ICD-10-CM | POA: Insufficient documentation

## 2022-07-05 DIAGNOSIS — I5032 Chronic diastolic (congestive) heart failure: Secondary | ICD-10-CM | POA: Insufficient documentation

## 2022-07-05 DIAGNOSIS — L97812 Non-pressure chronic ulcer of other part of right lower leg with fat layer exposed: Secondary | ICD-10-CM | POA: Insufficient documentation

## 2022-07-05 DIAGNOSIS — I872 Venous insufficiency (chronic) (peripheral): Secondary | ICD-10-CM | POA: Insufficient documentation

## 2022-07-05 DIAGNOSIS — I890 Lymphedema, not elsewhere classified: Secondary | ICD-10-CM | POA: Diagnosis not present

## 2022-07-05 DIAGNOSIS — N183 Chronic kidney disease, stage 3 unspecified: Secondary | ICD-10-CM | POA: Insufficient documentation

## 2022-07-05 DIAGNOSIS — L97412 Non-pressure chronic ulcer of right heel and midfoot with fat layer exposed: Secondary | ICD-10-CM | POA: Insufficient documentation

## 2022-07-05 DIAGNOSIS — Z09 Encounter for follow-up examination after completed treatment for conditions other than malignant neoplasm: Secondary | ICD-10-CM | POA: Insufficient documentation

## 2022-07-05 DIAGNOSIS — L97822 Non-pressure chronic ulcer of other part of left lower leg with fat layer exposed: Secondary | ICD-10-CM | POA: Insufficient documentation

## 2022-07-05 NOTE — Progress Notes (Signed)
[name] ([id number]) [id number] Page 1 of 8 Visit Report for 07/05/2022 Chief Complaint Document Details Patient Name: Date of Service: [name], [name] 07/05/2022 12:30 PM Medical Record Number: [id number] Patient Account Number: [id number] Date of Birth/Sex: Treating RN: 08-Aug-1935 (87 y.o. M) Primary Care Provider: Merri [name] Other Clinician: Referring Provider: Treating Provider/Extender: [name] Weeks in Treatment: 6 Information Obtained from: Patient Chief Complaint Patient presents for treatment of open ulcers due to venous insufficiency and lymphedema Electronic Signature(s) Signed: 07/05/2022 1:30:47 PM By: [name] Guess MD FACS Entered By: [name] on 07/05/2022 13:30:47 -------------------------------------------------------------------------------- HPI Details Patient Name: Date of Service: [name], [name] 07/05/2022 12:30 PM Medical Record Number: [id number] Patient Account Number: [id number] Date of Birth/Sex: Treating RN: 12/05/1935 (87 y.o. M) Primary Care Provider: Merri [name] Other Clinician: Referring Provider: Treating Provider/Extender: [name] Weeks in Treatment: 6 History of Present Illness HPI Description: ADMISSION This is an 87 year old man with a past medical history significant for congestive heart failure, CKD stage III, Parkinson's disease, venous insufficiency, pulmonary hypertension, chronic A-fib/a flutter on Eliquis, coronary artery disease, and lymphedema. He has been followed in the vascular surgery clinic by Dr. Edilia [name]. Per Dr. Adele [name] last note, lymphedema pumps have been ordered, but he has not yet received them. He referred the patient to the wound care clinic for closer management of his lymphedema and venous ulcers. Venous reflux studies have been performed and unfortunately, the reflux is deep and therefore not amenable to laser  ablation. ABIs in clinic today were noncompressible, but he had excellent Doppler signals. He has a small wound on his left anterior tibial surface with a bit of eschar present. He has another small wound on his right anterior tibial surface that is quite clean. He has a larger superficial ulcer on his right posterior calf that has a small amount of slough present. 04/07/2022: The only remaining open wounds are a small superficial lesion on his right posterior calf and a small ulcer on his left anterior tibial surface. They are both clean without any slough or eschar accumulation. Edema control is good. 04/15/2022: His wounds are healed. READMISSION 05/24/2022 He returns today with multiple open wounds, most of which are from lack of understanding and compliance with the need to wear his juxta lite stockings throughout the day in addition to using his lymphedema pumps. He has been just using his lymphedema pumps without consistent compression garment use. He also struck his left lateral leg on a car door and has a wound related to this. He has multiple small wounds on his lateral right leg and bilateral posterior calves. He also has an ulcer on his plantar right calcaneus. There is slough and eschar on all surfaces. His edema is completely uncontrolled. 05/30/2022: He has 2 ulcers on the back of his right leg that are open and the large left lateral lower leg wound as well as the right heel ulcer. The other wounds have closed. There is slough accumulation on all surfaces, along with a little bit of eschar on the leg wounds. There was some misunderstanding between the patient's wife and myself; she applied his juxta lite stockings over his 4-layer compression and had him using his lymphedema pumps twice a day. This resulted in a significant decrease in his swelling, but was probably more compression than indicated. I clarified that the juxta lite stockings were to be applied after his wounds had healed and  he was out of our compression wraps. He  should, however, continue to use his lymphedema pumps over our wraps now and his juxta lite stockings once he is healed. 06/07/2022: Under a layer of eschar and dry skin, all of the posterior leg wounds are closed. The left lateral lower leg wound has a layer of eschar and dried silver alginate present. Underneath this, the wound has closed up considerably. There is some slough on the wound surface. He has a new abrasion to his right anterior tibial surface which they think was caused by his lymphedema pumps. The wound is limited to skin breakdown only. 06/14/2022: The only open wound is a skin tear on his lateral left lower leg. Everything else is healed. 06/24/2022: He was in the hospital for a few days after a fall and was out of compression so his legs are massive and edematous. The wound on his heel somehow was closed out, but it is never actually been healed. He has a new wound on his right anterior tibial surface, again believed to be caused by his lymphedema pumps. The left lateral lower leg wound is smaller and more superficial. 07/05/2022: All of his wounds are healed. His edema control is markedly improved. The issue with the lymphedema pumps has also been resolved. [name] ([id number]) [id number] Page 2 of 8 Electronic Signature(s) Signed: 07/05/2022 1:31:15 PM By: [name] Guess MD FACS Entered By: [name] on 07/05/2022 13:31:15 -------------------------------------------------------------------------------- Physical Exam Details Patient Name: Date of Service: [name], [name] 07/05/2022 12:30 PM Medical Record Number: [id number] Patient Account Number: [id number] Date of Birth/Sex: Treating RN: 1935/04/06 (87 y.o. M) Primary Care Provider: Merri [name] Other Clinician: Referring Provider: Treating Provider/Extender: [name] Weeks in Treatment: 6 Constitutional .  Slightly bradycardic. . . no acute distress. Respiratory Normal work of breathing on room air. Notes 07/05/2022: All of his wounds are healed. Edema control is good. Electronic Signature(s) Signed: 07/05/2022 1:31:55 PM By: [name] Guess MD FACS Entered By: [name] on 07/05/2022 13:31:55 -------------------------------------------------------------------------------- Physician Orders Details Patient Name: Date of Service: [name], [name] 07/05/2022 12:30 PM Medical Record Number: [id number] Patient Account Number: [id number] Date of Birth/Sex: Treating RN: 1935-12-07 (87 y.o. [name] Primary Care Provider: Merri [name] Other Clinician: Referring Provider: Treating Provider/Extender: [name] Weeks in Treatment: 6 Verbal / Phone Orders: No Diagnosis Coding ICD-10 Coding Code Description [phone] Non-pressure chronic ulcer of other part of left lower leg with fat layer exposed L97.812 Non-pressure chronic ulcer of other part of right lower leg with fat layer exposed L97.412 Non-pressure chronic ulcer of right heel and midfoot with fat layer exposed N18.30 Chronic kidney disease, stage 3 unspecified I50.32 Chronic diastolic (congestive) heart failure I89.0 Lymphedema, not elsewhere classified I87.2 Venous insufficiency (chronic) (peripheral) G20.C Parkinsonism, unspecified Discharge From [facility] Services Discharge from Wound Care Center Bathing/ Shower/ Hygiene May shower and wash wound with soap and water. Edema Control - Lymphedema / SCD / Other Bilateral Lower Extremities Lymphedema Pumps. Use Lymphedema pumps on leg(s) 2-3 times a day for 45-60 minutes. If wearing any wraps or hose, do not remove them. Continue exercising as instructed. Elevate legs to the level of the heart or above for 30 minutes daily and/or when sitting for 3-4 times a day throughout the day. - whenever sitting Avoid standing for long periods of time. Exercise  regularly Moisturize legs daily. Compression stocking or Garment 30-40 mm/Hg pressure to: - juxtalites both legs daily MAEJOR, ANDREONI [name] ([id number]) [id number].pdf Page 3 of 8 Electronic Signature(s) Signed: 07/05/2022  1:32:04 PM By: [name] Guess MD FACS Entered By: [name] on 07/05/2022 13:32:04 -------------------------------------------------------------------------------- Problem List Details Patient Name: Date of Service: [name], [name] 07/05/2022 12:30 PM Medical Record Number: [id number] Patient Account Number: [id number] Date of Birth/Sex: Treating RN: 1935-10-22 (87 y.o. [name] Primary Care Provider: Merri [name] Other Clinician: Referring Provider: Treating Provider/Extender: [name] Weeks in Treatment: 6 Active Problems ICD-10 Encounter Code Description Active Date MDM Diagnosis [phone] Non-pressure chronic ulcer of other part of left lower leg with fat layer exposed3/26/2024 No Yes L97.812 Non-pressure chronic ulcer of other part of right lower leg with fat layer 06/24/2022 No Yes exposed L97.412 Non-pressure chronic ulcer of right heel and midfoot with fat layer exposed 06/24/2022 No Yes N18.30 Chronic kidney disease, stage 3 unspecified 05/24/2022 No Yes I50.32 Chronic diastolic (congestive) heart failure 05/24/2022 No Yes I89.0 Lymphedema, not elsewhere classified 05/24/2022 No Yes I87.2 Venous insufficiency (chronic) (peripheral) 05/24/2022 No Yes G20.C Parkinsonism, unspecified 05/24/2022 No Yes Inactive Problems Resolved Problems ICD-10 Code Description Active Date Resolved Date L97.212 Non-pressure chronic ulcer of right calf with fat layer exposed 05/24/2022 05/24/2022 B14.782 Non-pressure chronic ulcer of left calf with fat layer exposed 05/24/2022 05/24/2022 L97.811 Non-pressure chronic ulcer of other part of right lower leg limited to breakdown of skin 06/07/2022 06/07/2022 Electronic  Signature(s) Signed: 07/05/2022 1:30:14 PM By: [name] Guess MD FACS [name], [name] ([id number]) [id number] Page 4 of 8 Entered By: [name] on 07/05/2022 13:30:14 -------------------------------------------------------------------------------- Progress Note Details Patient Name: Date of Service: [name], [name] 07/05/2022 12:30 PM Medical Record Number: [id number] Patient Account Number: [id number] Date of Birth/Sex: Treating RN: 1935/09/13 (87 y.o. M) Primary Care Provider: Merri [name] Other Clinician: Referring Provider: Treating Provider/Extender: [name] Weeks in Treatment: 6 Subjective Chief Complaint Information obtained from Patient Patient presents for treatment of open ulcers due to venous insufficiency and lymphedema History of Present Illness (HPI) ADMISSION This is an 87 year old man with a past medical history significant for congestive heart failure, CKD stage III, Parkinson's disease, venous insufficiency, pulmonary hypertension, chronic A-fib/a flutter on Eliquis, coronary artery disease, and lymphedema. He has been followed in the vascular surgery clinic by Dr. Edilia [name]. Per Dr. Adele [name] last note, lymphedema pumps have been ordered, but he has not yet received them. He referred the patient to the wound care clinic for closer management of his lymphedema and venous ulcers. Venous reflux studies have been performed and unfortunately, the reflux is deep and therefore not amenable to laser ablation. ABIs in clinic today were noncompressible, but he had excellent Doppler signals. He has a small wound on his left anterior tibial surface with a bit of eschar present. He has another small wound on his right anterior tibial surface that is quite clean. He has a larger superficial ulcer on his right posterior calf that has a small amount of slough present. 04/07/2022: The only remaining open wounds are a small  superficial lesion on his right posterior calf and a small ulcer on his left anterior tibial surface. They are both clean without any slough or eschar accumulation. Edema control is good. 04/15/2022: His wounds are healed. READMISSION 05/24/2022 He returns today with multiple open wounds, most of which are from lack of understanding and compliance with the need to wear his juxta lite stockings throughout the day in addition to using his lymphedema pumps. He has been just using his lymphedema pumps without consistent compression garment use. He also struck his left lateral leg on a  car door and has a wound related to this. He has multiple small wounds on his lateral right leg and bilateral posterior calves. He also has an ulcer on his plantar right calcaneus. There is slough and eschar on all surfaces. His edema is completely uncontrolled. 05/30/2022: He has 2 ulcers on the back of his right leg that are open and the large left lateral lower leg wound as well as the right heel ulcer. The other wounds have closed. There is slough accumulation on all surfaces, along with a little bit of eschar on the leg wounds. There was some misunderstanding between the patient's wife and myself; she applied his juxta lite stockings over his 4-layer compression and had him using his lymphedema pumps twice a day. This resulted in a significant decrease in his swelling, but was probably more compression than indicated. I clarified that the juxta lite stockings were to be applied after his wounds had healed and he was out of our compression wraps. He should, however, continue to use his lymphedema pumps over our wraps now and his juxta lite stockings once he is healed. 06/07/2022: Under a layer of eschar and dry skin, all of the posterior leg wounds are closed. The left lateral lower leg wound has a layer of eschar and dried silver alginate present. Underneath this, the wound has closed up considerably. There is some slough on  the wound surface. He has a new abrasion to his right anterior tibial surface which they think was caused by his lymphedema pumps. The wound is limited to skin breakdown only. 06/14/2022: The only open wound is a skin tear on his lateral left lower leg. Everything else is healed. 06/24/2022: He was in the hospital for a few days after a fall and was out of compression so his legs are massive and edematous. The wound on his heel somehow was closed out, but it is never actually been healed. He has a new wound on his right anterior tibial surface, again believed to be caused by his lymphedema pumps. The left lateral lower leg wound is smaller and more superficial. 07/05/2022: All of his wounds are healed. His edema control is markedly improved. The issue with the lymphedema pumps has also been resolved. Patient History Information obtained from Patient, Caregiver, Chart. Family History Heart Disease - Father, Stroke, No family history of Cancer, Diabetes, Hereditary Spherocytosis, Hypertension, Kidney Disease, Lung Disease, Seizures, Thyroid Problems, Tuberculosis. Social History Never smoker, Marital Status - Married, Alcohol Use - Daily - 2 cocktails per day, Drug Use - No History, Caffeine Use - Daily. Medical History Eyes Patient has history of Cataracts - bil extractions Denies history of Glaucoma, Optic Neuritis Hematologic/Lymphatic Patient has history of Lymphedema - lower legs Cardiovascular Patient has history of Arrhythmia - afib, Congestive Heart Failure, Coronary Artery Disease, Hypertension Endocrine Denies history of Type I Diabetes, Type II Diabetes Genitourinary Denies history of End Stage Renal Disease Integumentary (Skin) Denies history of History of Burn [name], [name] ([id number]) [id number] Page 5 of 8 Musculoskeletal Patient has history of Osteoarthritis - both knees Denies history of Gout, Rheumatoid Arthritis,  Osteomyelitis Oncologic Patient has history of Received Radiation - seed implants Denies history of Received Chemotherapy Psychiatric Patient has history of Confinement Anxiety Denies history of Anorexia/bulimia Hospitalization/Surgery History - left femur IM nail. - right shoulder melanoma excision. - cardioversion. - hemorrhoid surgery. - cardiac anfioplasty. - coronary atherectomy. - bil cataract extraction. - insertion prostate radiation seed. - lap cholecystectomy. - inguinal hernia repair. Medical  A Surgical History Notes nd Respiratory elevated hemidiaphragm Cardiovascular pulmonary hypertension, mitral valve regurgitation, dilated cardiomyopathy Genitourinary CKD stage 3 Neurologic parkinson's Oncologic skin CA removed, prostate CA Objective Constitutional Slightly bradycardic. no acute distress. Vitals Time Taken: 12:41 PM, Height: 71 in, Weight: 190 lbs, BMI: 26.5, Temperature: 97.6 F, Pulse: 57 bpm, Respiratory Rate: 18 breaths/min, Blood Pressure: 120/59 mmHg. Respiratory Normal work of breathing on room air. General Notes: 07/05/2022: All of his wounds are healed. Edema control is good. Integumentary (Hair, Skin) Wound #11 status is Healed - Epithelialized. Original cause of wound was Blister. The date acquired was: 06/10/2022. The wound has been in treatment 1 weeks. The wound is located on the Right,Anterior Lower Leg. The wound measures 0cm length x 0cm width x 0cm depth; 0cm^2 area and 0cm^3 volume. There is no tunneling or undermining noted. There is a small amount of serosanguineous drainage noted. There is no granulation within the wound bed. There is no necrotic tissue within the wound bed. The periwound skin appearance had no abnormalities noted for texture. The periwound skin appearance had no abnormalities noted for moisture. The periwound skin appearance exhibited: Hemosiderin Staining. Periwound temperature was noted as No Abnormality. Wound #5R status is  Open. Original cause of wound was Shear/Friction. The date acquired was: 05/24/2022. The wound has been in treatment 6 weeks. The wound is located on the Right Calcaneus. The wound measures 0cm length x 0cm width x 0cm depth; 0cm^2 area and 0cm^3 volume. There is no tunneling or undermining noted. There is a none present amount of drainage noted. There is no granulation within the wound bed. There is no necrotic tissue within the wound bed. The periwound skin appearance had no abnormalities noted for moisture. The periwound skin appearance had no abnormalities noted for color. The periwound skin appearance exhibited: Callus. Periwound temperature was noted as No Abnormality. The periwound has tenderness on palpation. Wound #8 status is Open. Original cause of wound was Trauma. The date acquired was: 05/09/2022. The wound has been in treatment 6 weeks. The wound is located on the Left,Lateral Lower Leg. The wound measures 0cm length x 0cm width x 0cm depth; 0cm^2 area and 0cm^3 volume. There is no tunneling or undermining noted. There is a none present amount of drainage noted. There is no granulation within the wound bed. There is no necrotic tissue within the wound bed. The periwound skin appearance had no abnormalities noted for texture. The periwound skin appearance had no abnormalities noted for moisture. The periwound skin appearance exhibited: Hemosiderin Staining. The periwound skin appearance did not exhibit: Atrophie Blanche, Cyanosis, Ecchymosis, Mottled, Pallor, Rubor, Erythema. Periwound temperature was noted as No Abnormality. Assessment Active Problems ICD-10 Non-pressure chronic ulcer of other part of left lower leg with fat layer exposed Non-pressure chronic ulcer of other part of right lower leg with fat layer exposed Non-pressure chronic ulcer of right heel and midfoot with fat layer exposed Chronic kidney disease, stage 3 unspecified Chronic diastolic (congestive) heart  failure Lymphedema, not elsewhere classified Venous insufficiency (chronic) (peripheral) Parkinsonism, unspecified [name], [name] ([id number]) [id number] Page 6 of 8 Plan Discharge From [facility] Services: Discharge from Wound Care Center Bathing/ Shower/ Hygiene: May shower and wash wound with soap and water. Edema Control - Lymphedema / SCD / Other: Lymphedema Pumps. Use Lymphedema pumps on leg(s) 2-3 times a day for 45-60 minutes. If wearing any wraps or hose, do not remove them. Continue exercising as instructed. Elevate legs to the level of the heart or above for  30 minutes daily and/or when sitting for 3-4 times a day throughout the day. - whenever sitting Avoid standing for long periods of time. Exercise regularly Moisturize legs daily. Compression stocking or Garment 30-40 mm/Hg pressure to: - juxtalites both legs daily 07/05/2022: All of his wounds are healed. We will apply Tubigrip for the ride home and they will put his juxta lites on when they get home. Continue to use lymphedema pumps regularly and keep legs elevated as much as possible. We will discharge him from the wound care center. He may follow-up as needed. Electronic Signature(s) Signed: 07/05/2022 1:34:26 PM By: [name] Guess MD FACS Entered By: [name] on 07/05/2022 13:34:25 -------------------------------------------------------------------------------- HxROS Details Patient Name: Date of Service: [name], [name] 07/05/2022 12:30 PM Medical Record Number: [id number] Patient Account Number: [id number] Date of Birth/Sex: Treating RN: February 08, 1936 (87 y.o. M) Primary Care Provider: Merri [name] Other Clinician: Referring Provider: Treating Provider/Extender: [name] Weeks in Treatment: 6 Information Obtained From Patient Caregiver Chart Eyes Medical History: Positive for: Cataracts - bil extractions Negative for: Glaucoma; Optic  Neuritis Hematologic/Lymphatic Medical History: Positive for: Lymphedema - lower legs Respiratory Medical History: Past Medical History Notes: elevated hemidiaphragm Cardiovascular Medical History: Positive for: Arrhythmia - afib; Congestive Heart Failure; Coronary Artery Disease; Hypertension Past Medical History Notes: pulmonary hypertension, mitral valve regurgitation, dilated cardiomyopathy Endocrine Medical History: Negative for: Type I Diabetes; Type II Diabetes Genitourinary Medical History: Negative for: End Stage Renal Disease Past Medical History Notes: CKD stage 3 [name], [name] ([id number]) [id number] Page 7 of 8 Integumentary (Skin) Medical History: Negative for: History of Burn Musculoskeletal Medical History: Positive for: Osteoarthritis - both knees Negative for: Gout; Rheumatoid Arthritis; Osteomyelitis Neurologic Medical History: Past Medical History Notes: parkinson's Oncologic Medical History: Positive for: Received Radiation - seed implants Negative for: Received Chemotherapy Past Medical History Notes: skin CA removed, prostate CA Psychiatric Medical History: Positive for: Confinement Anxiety Negative for: Anorexia/bulimia HBO Extended History Items Eyes: Cataracts Immunizations Pneumococcal Vaccine: Received Pneumococcal Vaccination: Yes Received Pneumococcal Vaccination On or After 60th Birthday: Yes Implantable Devices No devices added Hospitalization / Surgery History Type of Hospitalization/Surgery left femur IM nail right shoulder melanoma excision cardioversion hemorrhoid surgery cardiac anfioplasty coronary atherectomy bil cataract extraction insertion prostate radiation seed lap cholecystectomy inguinal hernia repair Family and Social History Cancer: No; Diabetes: No; Heart Disease: Yes - Father; Hereditary Spherocytosis: No; Hypertension: No; Kidney Disease: No; Lung Disease: No;  Seizures: No; Stroke: Yes; Thyroid Problems: No; Tuberculosis: No; Never smoker; Marital Status - Married; Alcohol Use: Daily - 2 cocktails per day; Drug Use: No History; Caffeine Use: Daily; Financial Concerns: No; Food, Clothing or Shelter Needs: No; Support System Lacking: No; Transportation Concerns: No Electronic Signature(s) Signed: 07/05/2022 3:59:13 PM By: [name] Guess MD FACS Entered By: [name] on 07/05/2022 13:31:21 -------------------------------------------------------------------------------- SuperBill Details Patient Name: Date of Service: [name], [name] 07/05/2022 Medical Record Number: [id number] Patient Account Number: [id number] Date of Birth/Sex: Treating RN: 1935-07-07 (87 y.o. 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 N18.30 Chronic kidney disease, stage 3 unspecified I50.32 Chronic  diastolic (congestive) heart failure I89.0 Lymphedema, not elsewhere classified I87.2 Venous insufficiency (chronic) (peripheral) G20.C Parkinsonism, unspecified Facility Procedures : CPT4 Code: 40981191 Description: 99214 - WOUND CARE VISIT-LEV 4 EST PT Modifier: Quantity: 1 Physician Procedures : CPT4 Code Description Modifier [id number] 99213 - WC PHYS LEVEL 3 - EST PT ICD-10 Diagnosis Description I89.0 Lymphedema, not elsewhere classified I87.2 Venous insufficiency (chronic) (peripheral) I50.32 Chronic diastolic (congestive) heart failure  N18.30  Chronic kidney disease, stage 3 unspecified Quantity: 1 Electronic Signature(s) Signed: 07/05/2022 1:34:41 PM By: [name] Guess MD FACS Entered By: [name] on 07/05/2022 13:34:41

## 2022-07-05 NOTE — Telephone Encounter (Signed)
Patient needs an appt.

## 2022-07-06 DIAGNOSIS — I130 Hypertensive heart and chronic kidney disease with heart failure and stage 1 through stage 4 chronic kidney disease, or unspecified chronic kidney disease: Secondary | ICD-10-CM | POA: Diagnosis not present

## 2022-07-06 DIAGNOSIS — S81802D Unspecified open wound, left lower leg, subsequent encounter: Secondary | ICD-10-CM | POA: Diagnosis not present

## 2022-07-06 DIAGNOSIS — I890 Lymphedema, not elsewhere classified: Secondary | ICD-10-CM | POA: Diagnosis not present

## 2022-07-06 DIAGNOSIS — I5032 Chronic diastolic (congestive) heart failure: Secondary | ICD-10-CM | POA: Diagnosis not present

## 2022-07-06 DIAGNOSIS — S81801D Unspecified open wound, right lower leg, subsequent encounter: Secondary | ICD-10-CM | POA: Diagnosis not present

## 2022-07-06 DIAGNOSIS — N179 Acute kidney failure, unspecified: Secondary | ICD-10-CM | POA: Diagnosis not present

## 2022-07-06 NOTE — Progress Notes (Signed)
[name], [name] ([id number]) [id number] Page 1 of 10 Visit Report for 07/05/2022 Arrival Information Details Patient Name: Date of Service: TYCHICUS, SCIUTO 07/05/2022 12:30 PM Medical Record Number: [id number] Patient Account Number: [id number] Date of Birth/Sex: Treating RN: 10-17-1935 (87 y.o. [name] Primary Care Acie Custis: Merri Brunette Other Clinician: Referring Enas Winchel: Treating Ovie Eastep/Extender: Mortimer Fries Weeks in Treatment: 6 Visit Information History Since Last Visit Added or deleted any medications: Yes Patient Arrived: Wheel Chair Any new allergies or adverse reactions: No Arrival Time: 12:39 Had a fall or experienced change in No Accompanied By: spouse activities of daily living that may affect Transfer Assistance: None risk of falls: Patient Identification Verified: Yes Signs or symptoms of abuse/neglect since last visito No Secondary Verification Process Completed: Yes Hospitalized since last visit: No Patient Requires Transmission-Based Precautions: No Implantable device outside of the clinic excluding No Patient Has Alerts: Yes cellular tissue based products placed in the center Patient Alerts: ABI: BLE [place] since last visit: Has Dressing in Place as Prescribed: Yes Has Compression in Place as Prescribed: Yes Pain Present Now: No Electronic Signature(s) Signed: 07/06/2022 4:26:39 PM By: Zenaida Deed RN, BSN Entered By: Zenaida Deed on 07/05/2022 12:40:58 -------------------------------------------------------------------------------- Clinic Level of Care Assessment Details Patient Name: Date of Service: [name], Whittaker E. 07/05/2022 12:30 PM Medical Record Number: [id number] Patient Account Number: [id number] Date of Birth/Sex: Treating RN: 1935-03-24 (87 y.o. [name] Primary Care Lylliana Kitamura: Merri Brunette Other Clinician: Referring Jalaiyah Throgmorton: Treating Sharunda Salmon/Extender: Mortimer Fries Weeks in Treatment: 6 Clinic Level of Care Assessment Items TOOL 4 Quantity Score []  - 0 Use when only an EandM is performed on FOLLOW-UP visit ASSESSMENTS - Nursing Assessment / Reassessment X- 1 10 Reassessment of Co-morbidities (includes updates in patient status) X- 1 5 Reassessment of Adherence to Treatment Plan ASSESSMENTS - Wound and Skin A ssessment / Reassessment []  - 0 Simple Wound Assessment / Reassessment - one wound X- 3 5 Complex Wound Assessment / Reassessment - multiple wounds []  - 0 Dermatologic / Skin Assessment (not related to wound area) ASSESSMENTS - Focused Assessment X- 2 5 Circumferential Edema Measurements - multi extremities []  - 0 Nutritional Assessment / Counseling / Intervention X- 1 5 Lower Extremity Assessment (monofilament, tuning fork, pulses) []  - 0 Peripheral Arterial Disease Assessment (using hand held doppler) ASSESSMENTS - Ostomy and/or Continence Assessment and Care []  - 0 Incontinence Assessment and Management []  - 0 Ostomy Care Assessment and Management (repouching, etc.) PROCESS - Coordination of Care MCKYLE, TORTORA E ([id number]) [id number] Page 2 of 10 X- 1 15 Simple Patient / Family Education for ongoing care []  - 0 Complex (extensive) Patient / Family Education for ongoing care X- 1 10 Staff obtains Chiropractor, Records, T Results / Process Orders est []  - 0 Staff telephones HHA, Nursing Homes / Clarify orders / etc []  - 0 Routine Transfer to another Facility (non-emergent condition) []  - 0 Routine Hospital Admission (non-emergent condition) []  - 0 New Admissions / Manufacturing engineer / Ordering NPWT Apligraf, etc. , []  - 0 Emergency Hospital Admission (emergent condition) X- 1 10 Simple Discharge Coordination []  - 0 Complex (extensive) Discharge Coordination PROCESS - Special Needs []  - 0 Pediatric / Minor Patient Management []  - 0 Isolation Patient  Management []  - 0 Hearing / Language / Visual special needs []  - 0 Assessment of Community assistance (transportation, D/C planning, etc.) []  - 0 Additional assistance / Altered mentation []  - 0 Support Surface(s) Assessment (bed, cushion, seat, etc.)  INTERVENTIONS - Wound Cleansing / Measurement []  - 0 Simple Wound Cleansing - one wound X- 2 5 Complex Wound Cleansing - multiple wounds X- 1 5 Wound Imaging (photographs - any number of wounds) []  - 0 Wound Tracing (instead of photographs) []  - 0 Simple Wound Measurement - one wound X- 2 5 Complex Wound Measurement - multiple wounds INTERVENTIONS - Wound Dressings X - Small Wound Dressing one or multiple wounds 1 10 []  - 0 Medium Wound Dressing one or multiple wounds []  - 0 Large Wound Dressing one or multiple wounds []  - 0 Application of Medications - topical []  - 0 Application of Medications - injection INTERVENTIONS - Miscellaneous []  - 0 External ear exam []  - 0 Specimen Collection (cultures, biopsies, blood, body fluids, etc.) []  - 0 Specimen(s) / Culture(s) sent or taken to Lab for analysis []  - 0 Patient Transfer (multiple staff / Nurse, adult / Similar devices) []  - 0 Simple Staple / Suture removal (25 or less) []  - 0 Complex Staple / Suture removal (26 or more) []  - 0 Hypo / Hyperglycemic Management (close monitor of Blood Glucose) []  - 0 Ankle / Brachial Index (ABI) - do not check if billed separately X- 1 5 Vital Signs Has the patient been seen at the hospital within the last three years: Yes Total Score: 120 Level Of Care: New/Established - Level 4 Electronic Signature(s) Signed: 07/06/2022 4:26:39 PM By: Zenaida Deed RN, BSN Hain, Trysten E ([id number]) [phone].pdf Page 3 of 10 Entered By: Zenaida Deed on 07/05/2022 13:14:45 -------------------------------------------------------------------------------- Encounter Discharge Information Details Patient Name: Date of  Service: ANTHONE, VAUTOUR 07/05/2022 12:30 PM Medical Record Number: [id number] Patient Account Number: [id number] Date of Birth/Sex: Treating RN: January 08, 1936 (87 y.o. [name] Primary Care Marionette Meskill: Merri Brunette Other Clinician: Referring Loany Neuroth: Treating Nyomi Howser/Extender: Mortimer Fries Weeks in Treatment: 6 Encounter Discharge Information Items Discharge Condition: Stable Ambulatory Status: Wheelchair Discharge Destination: Home Transportation: Private Auto Accompanied By: spouse Schedule Follow-up Appointment: Yes Clinical Summary of Care: Patient Declined Electronic Signature(s) Signed: 07/06/2022 4:26:39 PM By: Zenaida Deed RN, BSN Entered By: Zenaida Deed on 07/05/2022 13:15:41 -------------------------------------------------------------------------------- Lower Extremity Assessment Details Patient Name: Date of Service: [name], Addison E. 07/05/2022 12:30 PM Medical Record Number: [id number] Patient Account Number: [id number] Date of Birth/Sex: Treating RN: May 15, 1935 (87 y.o. [name] Primary Care Josefa [place]: Merri Brunette Other Clinician: Referring Alicya Bena: Treating Tanisa Lagace/Extender: Mortimer Fries Weeks in Treatment: 6 Edema Assessment Assessed: [Left: No] [Right: No] Edema: [Left: Yes] [Right: Yes] Calf Left: Right: Point of Measurement: From Medial Instep 37.5 cm 40 cm Ankle Left: Right: Point of Measurement: From Medial Instep 24 cm 25 cm Vascular Assessment Pulses: Dorsalis Pedis Palpable: [Left:Yes] [Right:Yes] Electronic Signature(s) Signed: 07/06/2022 4:26:39 PM By: Zenaida Deed RN, BSN Entered By: Zenaida Deed on 07/05/2022 12:48:25 -------------------------------------------------------------------------------- Multi Wound Chart Details Patient Name: Date of Service: [name], Ramari E. 07/05/2022 12:30 PM Medical Record Number: [id number] Patient Account Number:  [id number] Date of Birth/Sex: Treating RN: 15-Sep-1935 (87 y.o. M) Primary Care Lucile Hillmann: Merri Brunette Other Clinician: Referring Katelyn Kohlmeyer: Treating Zykeria Laguardia/Extender: Mortimer Fries [place], Vearl E ([id number]) [id number] Page 4 of 10 Weeks in Treatment: 6 Vital Signs Height(in): 71 Pulse(bpm): 57 Weight(lbs): 190 Blood Pressure(mmHg): 120/59 Body Mass Index(BMI): 26.5 Temperature(F): 97.6 Respiratory Rate(breaths/min): 18 [11:Photos:] Right, Anterior Lower Leg Right Calcaneus Left, Lateral Lower Leg Wound Location: Blister Shear/Friction Trauma Wounding Event: Lymphedema Lymphedema Lymphedema Primary Etiology: Cataracts, Lymphedema, Arrhythmia, Cataracts, Lymphedema, Arrhythmia, Cataracts, Lymphedema, Arrhythmia,  Comorbid History: Congestive Heart Failure, Coronary Congestive Heart Failure, Coronary Congestive Heart Failure, Coronary Artery Disease, Hypertension, Artery Disease, Hypertension, Artery Disease, Hypertension, Osteoarthritis, Received Radiation, Osteoarthritis, Received Radiation, Osteoarthritis, Received Radiation, Confinement Anxiety Confinement Anxiety Confinement Anxiety 06/10/2022 05/24/2022 05/09/2022 Date Acquired: 1 6 6  Weeks of Treatment: Healed - Epithelialized Open Open Wound Status: No Yes No Wound Recurrence: 0x0x0 0x0x0 0x0x0 Measurements L x W x D (cm) 0 0 0 A (cm) : rea 0 0 0 Volume (cm) : 100.00% 100.00% 100.00% % Reduction in Area: 100.00% 100.00% 100.00% % Reduction in Volume: Full Thickness Without Exposed Full Thickness Without Exposed Full Thickness Without Exposed Classification: Support Structures Support Structures Support Structures Small None Present None Present Exudate Amount: Serosanguineous N/A N/A Exudate Type: red, brown N/A N/A Exudate Color: None Present (0%) None Present (0%) None Present (0%) Granulation Amount: None Present (0%) None Present (0%) None Present  (0%) Necrotic Amount: Fascia: No Fascia: No Fascia: No Exposed Structures: Fat Layer (Subcutaneous Tissue): No Fat Layer (Subcutaneous Tissue): No Fat Layer (Subcutaneous Tissue): No Tendon: No Tendon: No Tendon: No Muscle: No Muscle: No Muscle: No Joint: No Joint: No Joint: No Bone: No Bone: No Bone: No Large (67-100%) Large (67-100%) Large (67-100%) Epithelialization: No Abnormalities Noted Callus: Yes Excoriation: No Periwound Skin Texture: Induration: No Callus: No Crepitus: No Rash: No Scarring: No No Abnormalities Noted No Abnormalities Noted Maceration: No Periwound Skin Moisture: Dry/Scaly: No Hemosiderin Staining: Yes No Abnormalities Noted Hemosiderin Staining: Yes Periwound Skin Color: Atrophie Blanche: No Cyanosis: No Ecchymosis: No Erythema: No Mottled: No Pallor: No Rubor: No No Abnormality No Abnormality No Abnormality Temperature: N/A Yes N/A Tenderness on Palpation: Treatment Notes Wound #5R (Calcaneus) Wound Laterality: Right Cleanser Peri-Wound Care Topical Primary Dressing KWMANE, BRADEN E ([id number]) [id number] Page 5 of 10 Secondary Dressing Secured With Compression Wrap Compression Stockings Add-Ons Wound #8 (Lower Leg) Wound Laterality: Left, Lateral Cleanser Peri-Wound Care Topical Primary Dressing Secondary Dressing Secured With Compression Wrap Compression Stockings Add-Ons Electronic Signature(s) Signed: 07/05/2022 1:30:21 PM By: Duanne Guess MD FACS Entered By: Duanne Guess on 07/05/2022 13:30:20 -------------------------------------------------------------------------------- Multi-Disciplinary Care Plan Details Patient Name: Date of Service: [name], Andres E. 07/05/2022 12:30 PM Medical Record Number: [id number] Patient Account Number: [id number] Date of Birth/Sex: Treating RN: 12-Dec-1935 (87 y.o. [name] Primary Care Meka Lewan: Merri Brunette Other  Clinician: Referring Kanai Hilger: Treating Cyrena Kuchenbecker/Extender: Mortimer Fries Weeks in Treatment: 6 Multidisciplinary Care Plan reviewed with physician Active Inactive Electronic Signature(s) Signed: 07/06/2022 4:26:39 PM By: Zenaida Deed RN, BSN Entered By: Zenaida Deed on 07/05/2022 13:16:22 -------------------------------------------------------------------------------- Pain Assessment Details Patient Name: Date of Service: [name], Jairus E. 07/05/2022 12:30 PM Medical Record Number: [id number] Patient Account Number: [id number] Date of Birth/Sex: Treating RN: Jul 09, 1935 (87 y.o. [name] Primary Care Heyli Min: Merri Brunette Other Clinician: Referring Velvie Thomaston: Treating Sharee Sturdy/Extender: Mortimer Fries Weeks in Treatment: 6 Active Problems Location of Pain Severity and Description of Pain Patient Has Paino No Site Locations Rate the pain. ADNREW, HONKALA ([id number]) [id number] Page 6 of 10 Rate the pain. Current Pain Level: 0 Pain Management and Medication Current Pain Management: Electronic Signature(s) Signed: 07/06/2022 4:26:39 PM By: Zenaida Deed RN, BSN Entered By: Zenaida Deed on 07/05/2022 12:41:30 -------------------------------------------------------------------------------- Patient/Caregiver Education Details Patient Name: Date of Service: Otilio Saber 5/7/2024andnbsp12:30 PM Medical Record Number: [id number] Patient Account Number: [id number] Date of Birth/Gender: Treating RN: [birth year] (87 y.o. [name] Primary Care Physician: Merri Brunette Other Clinician: Referring Physician: Treating Physician/Extender: Lady Gary  Roney Mans, Candace Weeks in Treatment: 6 Education Assessment Education Provided To: Patient Education Topics Provided Venous: Methods: Explain/Verbal Responses: Reinforcements needed, State content correctly Wound/Skin  Impairment: Methods: Explain/Verbal Responses: Reinforcements needed, State content correctly Electronic Signature(s) Signed: 07/06/2022 4:26:39 PM By: Zenaida Deed RN, BSN Entered By: Zenaida Deed on 07/05/2022 13:00:34 -------------------------------------------------------------------------------- Wound Assessment Details Patient Name: Date of Service: [name], Harbor E. 07/05/2022 12:30 PM Medical Record Number: [id number] Patient Account Number: [id number] Date of Birth/Sex: Treating RN: 1935/04/21 (87 y.o. [name] Primary Care Merian Wroe: Merri Brunette Other Clinician: Referring Audrina Marten: Treating Benny Deutschman/Extender: Mortimer Fries Weeks in Treatment: 6 Wound Status Wound Number: 11 Primary Lymphedema Etiology: Wound Location: Right, Anterior Lower Leg Wilmon, Critcher Keiden E ([id number]) [phone].pdf Page 7 of 10 Wound Healed - Epithelialized Wounding Event: Blister Status: Date Acquired: 06/10/2022 Comorbid Cataracts, Lymphedema, Arrhythmia, Congestive Heart Failure, Weeks Of Treatment: 1 History: Coronary Artery Disease, Hypertension, Osteoarthritis, Received Clustered Wound: No Radiation, Confinement Anxiety Photos Wound Measurements Length: (cm) Width: (cm) Depth: (cm) Area: (cm) Volume: (cm) 0 % Reduction in Area: 100% 0 % Reduction in Volume: 100% 0 Epithelialization: Large (67-100%) 0 Tunneling: No 0 Undermining: No Wound Description Classification: Full Thickness Without Exposed Support Structures Exudate Amount: Small Exudate Type: Serosanguineous Exudate Color: red, brown Foul Odor After Cleansing: No Slough/Fibrino No Wound Bed Granulation Amount: None Present (0%) Exposed Structure Necrotic Amount: None Present (0%) Fascia Exposed: No Fat Layer (Subcutaneous Tissue) Exposed: No Tendon Exposed: No Muscle Exposed: No Joint Exposed: No Bone Exposed: No Periwound Skin Texture Texture Color No  Abnormalities Noted: Yes No Abnormalities Noted: No Hemosiderin Staining: Yes Moisture No Abnormalities Noted: Yes Temperature / Pain Temperature: No Abnormality Electronic Signature(s) Signed: 07/06/2022 4:26:39 PM By: Zenaida Deed RN, BSN Entered By: Zenaida Deed on 07/05/2022 12:58:30 -------------------------------------------------------------------------------- Wound Assessment Details Patient Name: Date of Service: [name], Rohen E. 07/05/2022 12:30 PM Medical Record Number: [id number] Patient Account Number: [id number] Date of Birth/Sex: Treating RN: 1935-10-01 (87 y.o. [name] Primary Care Dung Salinger: Merri Brunette Other Clinician: Referring Akaash Vandewater: Treating Vasily Fedewa/Extender: Mortimer Fries Weeks in Treatment: 6 Wound Status Wound Number: 5R Primary Lymphedema Etiology: Wound Location: Right Calcaneus Wound Open Wounding Event: Shear/Friction Status: Date Acquired: 05/24/2022 Comorbid Cataracts, Lymphedema, Arrhythmia, Congestive Heart Failure, Weeks Of Treatment: 6 History: Coronary Artery Disease, Hypertension, Osteoarthritis, Received Clustered Wound: No Radiation, Confinement Anxiety Apolo, Sharer Juanita E ([id number]) [phone].pdf Page 8 of 10 Photos Wound Measurements Length: (cm) Width: (cm) Depth: (cm) Area: (cm) Volume: (cm) 0 % Reduction in Area: 100% 0 % Reduction in Volume: 100% 0 Epithelialization: Large (67-100%) 0 Tunneling: No 0 Undermining: No Wound Description Classification: Full Thickness Without Exposed Support Structures Exudate Amount: None Present Foul Odor After Cleansing: No Slough/Fibrino No Wound Bed Granulation Amount: None Present (0%) Exposed Structure Necrotic Amount: None Present (0%) Fascia Exposed: No Fat Layer (Subcutaneous Tissue) Exposed: No Tendon Exposed: No Muscle Exposed: No Joint Exposed: No Bone Exposed: No Periwound Skin Texture Texture Color No  Abnormalities Noted: No No Abnormalities Noted: Yes Callus: Yes Temperature / Pain Temperature: No Abnormality Moisture No Abnormalities Noted: Yes Tenderness on Palpation: Yes Electronic Signature(s) Signed: 07/06/2022 4:26:39 PM By: Zenaida Deed RN, BSN Entered By: Zenaida Deed on 07/05/2022 12:58:59 -------------------------------------------------------------------------------- Wound Assessment Details Patient Name: Date of Service: [name], Joshual E. 07/05/2022 12:30 PM Medical Record Number: [id number] Patient Account Number: [id number] Date of Birth/Sex: Treating RN: 1936-01-25 (87 y.o. [name] Primary Care Emmalynne Courtney: Merri Brunette Other Clinician: Referring Jiovanna Frei: Treating  Hartman Minahan/Extender: Otho Ket, Candace Weeks in Treatment: 6 Wound Status Wound Number: 8 Primary Lymphedema Etiology: Wound Location: Left, Lateral Lower Leg Wound Open Wounding Event: Trauma Status: Date Acquired: 05/09/2022 Comorbid Cataracts, Lymphedema, Arrhythmia, Congestive Heart Failure, Weeks Of Treatment: 6 History: Coronary Artery Disease, Hypertension, Osteoarthritis, Received Clustered Wound: No Radiation, Confinement Anxiety Photos Takeshi, Pitman Yoel E ([id number]) [phone].pdf Page 9 of 10 Wound Measurements Length: (cm) Width: (cm) Depth: (cm) Area: (cm) Volume: (cm) 0 % Reduction in Area: 100% 0 % Reduction in Volume: 100% 0 Epithelialization: Large (67-100%) 0 Tunneling: No 0 Undermining: No Wound Description Classification: Full Thickness Without Exposed Support Structures Exudate Amount: None Present Foul Odor After Cleansing: No Slough/Fibrino Yes Wound Bed Granulation Amount: None Present (0%) Exposed Structure Necrotic Amount: None Present (0%) Fascia Exposed: No Fat Layer (Subcutaneous Tissue) Exposed: No Tendon Exposed: No Muscle Exposed: No Joint Exposed: No Bone Exposed: No Periwound Skin  Texture Texture Color No Abnormalities Noted: Yes No Abnormalities Noted: No Atrophie Blanche: No Moisture Cyanosis: No No Abnormalities Noted: Yes Ecchymosis: No Erythema: No Hemosiderin Staining: Yes Mottled: No Pallor: No Rubor: No Temperature / Pain Temperature: No Abnormality Electronic Signature(s) Signed: 07/06/2022 4:26:39 PM By: Zenaida Deed RN, BSN Entered By: Zenaida Deed on 07/05/2022 12:59:37 -------------------------------------------------------------------------------- Vitals Details Patient Name: Date of Service: [name], Jiovanny E. 07/05/2022 12:30 PM Medical Record Number: [id number] Patient Account Number: [id number] Date of Birth/Sex: Treating RN: March 13, 1935 (87 y.o. [name] Primary Care Cynthya Yam: Merri Brunette Other Clinician: Referring Esme Freund: Treating Aryon Nham/Extender: Mortimer Fries Weeks in Treatment: 6 Vital Signs Time Taken: 12:41 Temperature (F): 97.6 Height (in): 71 Pulse (bpm): 57 Weight (lbs): 190 Respiratory Rate (breaths/min): 18 Body Mass Index (BMI): 26.5 Blood Pressure (mmHg): 120/59 Reference Range: 80 - 120 mg / dl Harles, Kauk Jaykob E ([id number]) [phone].pdf Page 10 of 10 Electronic Signature(s) Signed: 07/06/2022 4:26:39 PM By: Zenaida Deed RN, BSN Entered By: Zenaida Deed on 07/05/2022 12:41:21

## 2022-07-07 ENCOUNTER — Other Ambulatory Visit: Payer: Self-pay

## 2022-07-07 DIAGNOSIS — I5032 Chronic diastolic (congestive) heart failure: Secondary | ICD-10-CM

## 2022-07-08 ENCOUNTER — Encounter: Payer: Self-pay | Admitting: Cardiology

## 2022-07-08 ENCOUNTER — Ambulatory Visit: Payer: Medicare Other | Admitting: Cardiology

## 2022-07-08 VITALS — BP 148/57 | HR 88 | Resp 16 | Ht 70.0 in | Wt 193.0 lb

## 2022-07-08 DIAGNOSIS — I130 Hypertensive heart and chronic kidney disease with heart failure and stage 1 through stage 4 chronic kidney disease, or unspecified chronic kidney disease: Secondary | ICD-10-CM | POA: Diagnosis not present

## 2022-07-08 DIAGNOSIS — I5032 Chronic diastolic (congestive) heart failure: Secondary | ICD-10-CM

## 2022-07-08 DIAGNOSIS — I890 Lymphedema, not elsewhere classified: Secondary | ICD-10-CM | POA: Diagnosis not present

## 2022-07-08 DIAGNOSIS — I4821 Permanent atrial fibrillation: Secondary | ICD-10-CM

## 2022-07-08 DIAGNOSIS — E876 Hypokalemia: Secondary | ICD-10-CM

## 2022-07-08 DIAGNOSIS — N179 Acute kidney failure, unspecified: Secondary | ICD-10-CM | POA: Diagnosis not present

## 2022-07-08 DIAGNOSIS — S81801D Unspecified open wound, right lower leg, subsequent encounter: Secondary | ICD-10-CM | POA: Diagnosis not present

## 2022-07-08 DIAGNOSIS — S81802D Unspecified open wound, left lower leg, subsequent encounter: Secondary | ICD-10-CM | POA: Diagnosis not present

## 2022-07-08 LAB — BASIC METABOLIC PANEL
BUN/Creatinine Ratio: 20 (ref 10–24)
BUN: 32 mg/dL — ABNORMAL HIGH (ref 8–27)
CO2: 29 mmol/L (ref 20–29)
Calcium: 9.3 mg/dL (ref 8.6–10.2)
Chloride: 90 mmol/L — ABNORMAL LOW (ref 96–106)
Creatinine, Ser: 1.62 mg/dL — ABNORMAL HIGH (ref 0.76–1.27)
Glucose: 109 mg/dL — ABNORMAL HIGH (ref 70–99)
Potassium: 2.8 mmol/L — ABNORMAL LOW (ref 3.5–5.2)
Sodium: 139 mmol/L (ref 134–144)
eGFR: 42 mL/min/{1.73_m2} — ABNORMAL LOW (ref >59)

## 2022-07-08 MED ORDER — FUROSEMIDE 40 MG PO TABS: 40.0000 mg | ORAL_TABLET | Freq: Two times a day (BID) | ORAL | 3 refills | Status: DC

## 2022-07-08 MED ORDER — POTASSIUM CHLORIDE ER 20 MEQ PO TBCR: 40.0000 meq | EXTENDED_RELEASE_TABLET | Freq: Three times a day (TID) | ORAL | 3 refills | Status: DC

## 2022-07-08 NOTE — Progress Notes (Signed)
c    Patient is here for follow up visit. Subjective:   @Patient  ID: [name], male    DOB: 1935-05-10, 87 y.o.   MRN: [id number]   Chief Complaint  Patient presents with   Permanent atrial fibrillation    Congestive Heart Failure Pertinent negatives include no chest pain, palpitations or shortness of breath.   87 year old Caucasian male with coronary artery disease status (LAD PCI 2018), permanent atrial fibrillation, complex valvular heart disease, mod AI, mod TR, moderate pulmonary hypertension, Parkinsons disease, CKD, chronic b/l leg edema  Patient was hospitalized in 05/2022 with fall at home.  It appeared to have been a mechanical fall in the setting of severe failure team, when he got up at night because he thought somebody was at the door.  He did not lose consciousness.  Fortunately, he did not sustain any significant injuries.  SNF versus long-term nursing home placement was discussed, but patient preferred to be at home.  Given his severe hypokalemia, high-dose Lasix and metolazone were held.  He was given replacement potassium.  However, subsequent potassium had continued to be low, 2.8 on 07/07/2022.  He also had AKI during hospitalization with peak creatinine of 2.11 that has now decreased to 1.62 as of 07/07/2022.  He is currently taking potassium 20 mg twice daily. His physical mobility remains limited. He has home health from [place]. Patient continues to wear compression stockings for chronic leg edema due to lymphedema. He has been using suction pump as per vascular surgery recommendations.     Current Outpatient Medications:    acetaminophen (TYLENOL) 325 MG tablet, Take 1-2 tablets (325-650 mg total) by mouth every 6 (six) hours as needed for mild pain (pain score 1-3 or temp > 100.5)., Disp: 100 tablet, Rfl: 0   apixaban (ELIQUIS) 2.5 MG TABS tablet, Take 1 tablet (2.5 mg total) by mouth 2 (two) times daily. (0800 & 2000) (Patient taking differently: Take 2.5  mg by mouth 2 (two) times daily.), Disp: 180 tablet, Rfl: 5   carbidopa-levodopa (SINEMET IR) 25-100 MG tablet, TAKE 2 TABLETS THREE TIMES DAILY AT 7:00AM, 11:00AM AND 4:00PM CAN TAKE EXTRA  1/2 to 1 pill as needed, Disp: 550 tablet, Rfl: 0   Cholecalciferol (VITAMIN D-3) 1000 units CAPS, Take 1,000 Units by mouth daily. (0800), Disp: , Rfl:    dapagliflozin propanediol (FARXIGA) 10 MG TABS tablet, Take 1 tablet by mouth daily., Disp: , Rfl:    diltiazem (CARDIZEM CD) 120 MG 24 hr capsule, TAKE 1 CAPSULE EVERY DAY (Patient taking differently: Take 120 mg by mouth daily.), Disp: 90 capsule, Rfl: 3   doxycycline (VIBRAMYCIN) 100 MG capsule, Take 100 mg by mouth 2 (two) times daily., Disp: , Rfl:    furosemide (LASIX) 40 MG tablet, Take 1 tablet (40 mg total) by mouth 2 (two) times daily. (Patient taking differently: Take 40 mg by mouth daily.), Disp: 60 tablet, Rfl: 3   gabapentin (NEURONTIN) 300 MG capsule, Take 1 capsule (300 mg total) by mouth 3 (three) times daily. (Patient taking differently: Take 300 mg by mouth daily.), Disp: 270 capsule, Rfl: 1   metolazone (ZAROXOLYN) 2.5 MG tablet, TAKE 1 TABLET (2.5 MG TOTAL) BY MOUTH AS DIRECTED TWICE A WEEK (Patient taking differently: Take 2.5 mg by mouth See admin instructions. Take 2.5 mg (1 tablet) by mouth twice a week on Sunday's and Wednesday's.), Disp: 26 tablet, Rfl: 2   Multiple Vitamins-Minerals (PRESERVISION AREDS PO), Take 1 tablet by mouth in the morning and at  bedtime., Disp: , Rfl:    nitroGLYCERIN (NITROSTAT) 0.4 MG SL tablet, TAKE 1 TABLET EVERY 5 MINUTES AS NEEDED FOR CHEST PAIN (Patient taking differently: Place 0.4 mg under the tongue every 5 (five) minutes as needed for chest pain.), Disp: 25 tablet, Rfl: 0   potassium chloride (KLOR-CON) 10 MEQ tablet, Take 2 tablets (20 mEq total) by mouth 2 (two) times daily., Disp: 60 tablet, Rfl: 3   rosuvastatin (CRESTOR) 10 MG tablet, Take 10 mg by mouth daily at 8 pm. (2000), Disp: , Rfl:    Cardiovascular studies:  EKG 03/24/2022: Atrial fibrillation 67 bpm  Right bundle branch block  RHC 03/20/2018: WHO Grp II pulmonary hypertension Mean PA 42 mmHg, PVR 2.4 WU Normal RV systolic function (PAPi 2.4) Recommendation: Continue medical management with aggressive diuresis.  Echocardiogram 12/16/2021: Left ventricle cavity is normal in size. Normal left ventricular wall thickness. Normal global wall motion. Normal LV systolic function with EF 61%. Unable to evaluate diastolic function due to atrial fibrillation.  Left atrial cavity is moderately dilated at 41.8 ml/m^2. Right atrial cavity is moderately dilated. Trileaflet aortic valve.  Moderate aortic valve leaflet calcification. Moderate (Grade II) aortic regurgitation.  Moderate (Grade II) mitral regurgitation.  Mild to moderate tricuspid regurgitation. The IVC is not seen. Pulmonary hypertension seen in 2019 not appreciated on this study.  Event monitor 04/27/2017 - 05/26/2017: Persistent atrial fibrillation with controlled ventricular rate.   Two episodes of 3 beat nonsustained VT 4.6 second pause at 11:34 PM on 05/03/2017.   3.9 second pause at 7 AM on 05/14/2017.  Symptoms not reported. Occasional episodes of atypical atrial flutter with variable conduction  Lower extremity venous duplex 04/07/2017: No evidence of deep vein thrombosis of the right lower extremity with normal venous return. No evidence of valvular incompetence (chronic venous insufficiency) of the right lower extremity.  No evidence of deep vein thrombosis of the left lower extremity with normal venous return. Chronic valvular incompetence (chronic venous insufficiency) of the left lower extremity. Marked reflux noted in the left sapheno-femoral junction (2.5 s), great saphenous proximal thigh (2.6 s), great saphenous mid thigh (1.6 s), great saphenous distal thigh (3.9 s) and great saphenous proximal calf (3.4 s) veins.  Cardioversion 01/26/2017:  S/p successful TEE guided cardioversion 100 JS/p successful TEE guided cardioversion 100 J  Coronary angiogram 08/09/2016: FFR guided CSI atherectomy followed by 3.0 x 26 and 3.0 x 18 mm onyx resolute DES to Mid LAD. Mild disease in Cx and RCA.  Lower Extremity Dopplers 06/2009: Pt has Baker Cyst behind both knees.   Recent labs: 07/07/2022: Glucose 110, BUN/Cr 46/2.07. EGFR 31. Na/K 135/2.6.   06/22/2022: Glucose 109, BUN/Cr 32/1.62. EGFR 42. Na/K 139/2.8.  H/H 11/36. MCV 89. Platelets 197  03/01/2022: Glucose 93, BUN/Cr 35/1.64. EGFR 41. Na/K 140/3.1.  BNP 222   Review of Systems  Eyes:  Negative for visual disturbance.  Cardiovascular:  Positive for leg swelling. Negative for chest pain, dyspnea on exertion (Mild, stable), palpitations and syncope.  Respiratory:  Negative for shortness of breath.   Musculoskeletal:  Positive for joint pain (Bilateral knees).  All other systems reviewed and are negative.      Objective:    Vitals:   07/08/22 0847  BP: (!) 148/57  Pulse: 88  Resp: 16  SpO2: 97%   Filed Weights   07/08/22 0847  Weight: 193 lb (87.5 kg)      Physical Exam Vitals and nursing note reviewed.  Constitutional:      General:  He is not in acute distress. Neck:     Vascular: No JVD.  Cardiovascular:     Rate and Rhythm: Normal rate. Rhythm irregularly irregular.     Pulses: Normal pulses and intact distal pulses.     Heart sounds: Murmur heard.     Midsystolic murmur is present with a grade of 3/6. Aortic Area     No gallop. No S3 or S4 sounds.     Comments: S1 is variable, S2 is normal. Pulmonary:     Effort: Pulmonary effort is normal.     Breath sounds: Normal breath sounds. No wheezing or rales.  Musculoskeletal:        General: Normal range of motion.     Cervical back: Neck supple.     Right lower leg: 2+ Pitting Edema (3+) present.     Left lower leg: 2+ Pitting Edema (3+) present.  Skin:    General: Skin is warm and dry.     Comments: B/l  leg wounds. Currently dressed       Assessment & Recommendations:   87 year old Caucasian male with coronary artery disease status (LAD PCI 2018), permanent atrial fibrillation, complex valvular heart disease, mod AI, mod TR, moderate pulmonary hypertension, Parkinsons disease, CKD, chronic b/l leg edema  Leg edema: Bilateral lymphedema.  Continue wound care. Continue lasix 40 mg bid, metolazone 2.5 mg twice weekly. Given hypokalemia, increased potassium to 40 mEq tid. BMP in 1 week  Permanent atrial fibrilation: Rate controlled. CHA2DS2VASc score 3: Annual stroke risk 3.2% Continue Eliquis 2.5 mg bid.  Continue diltiazem 120 mg for rate control.   Valvular disease: Mod aortic, mitral, tricuspid regurgitation, likely all due to atrial fibrilation. Rate control for Afib, as above.  Pulmonary hypertension: WHO Grp II pulmonary hypertension due to left sided heart diseease. Stable.  F/u in 6 months    Elder Negus, MD Pager: [phone] Office: [phone]

## 2022-07-11 ENCOUNTER — Ambulatory Visit (HOSPITAL_BASED_OUTPATIENT_CLINIC_OR_DEPARTMENT_OTHER): Payer: Medicare Other | Admitting: General Surgery

## 2022-07-13 DIAGNOSIS — S81801D Unspecified open wound, right lower leg, subsequent encounter: Secondary | ICD-10-CM | POA: Diagnosis not present

## 2022-07-13 DIAGNOSIS — I890 Lymphedema, not elsewhere classified: Secondary | ICD-10-CM | POA: Diagnosis not present

## 2022-07-13 DIAGNOSIS — I130 Hypertensive heart and chronic kidney disease with heart failure and stage 1 through stage 4 chronic kidney disease, or unspecified chronic kidney disease: Secondary | ICD-10-CM | POA: Diagnosis not present

## 2022-07-13 DIAGNOSIS — I5032 Chronic diastolic (congestive) heart failure: Secondary | ICD-10-CM | POA: Diagnosis not present

## 2022-07-13 DIAGNOSIS — S81802D Unspecified open wound, left lower leg, subsequent encounter: Secondary | ICD-10-CM | POA: Diagnosis not present

## 2022-07-13 DIAGNOSIS — N179 Acute kidney failure, unspecified: Secondary | ICD-10-CM | POA: Diagnosis not present

## 2022-07-15 DIAGNOSIS — I890 Lymphedema, not elsewhere classified: Secondary | ICD-10-CM | POA: Diagnosis not present

## 2022-07-15 DIAGNOSIS — I5032 Chronic diastolic (congestive) heart failure: Secondary | ICD-10-CM | POA: Diagnosis not present

## 2022-07-15 DIAGNOSIS — N179 Acute kidney failure, unspecified: Secondary | ICD-10-CM | POA: Diagnosis not present

## 2022-07-15 DIAGNOSIS — S81801D Unspecified open wound, right lower leg, subsequent encounter: Secondary | ICD-10-CM | POA: Diagnosis not present

## 2022-07-15 DIAGNOSIS — S81802D Unspecified open wound, left lower leg, subsequent encounter: Secondary | ICD-10-CM | POA: Diagnosis not present

## 2022-07-15 DIAGNOSIS — I130 Hypertensive heart and chronic kidney disease with heart failure and stage 1 through stage 4 chronic kidney disease, or unspecified chronic kidney disease: Secondary | ICD-10-CM | POA: Diagnosis not present

## 2022-07-19 DIAGNOSIS — N179 Acute kidney failure, unspecified: Secondary | ICD-10-CM | POA: Diagnosis not present

## 2022-07-19 DIAGNOSIS — I5032 Chronic diastolic (congestive) heart failure: Secondary | ICD-10-CM | POA: Diagnosis not present

## 2022-07-19 DIAGNOSIS — S81801D Unspecified open wound, right lower leg, subsequent encounter: Secondary | ICD-10-CM | POA: Diagnosis not present

## 2022-07-19 DIAGNOSIS — I890 Lymphedema, not elsewhere classified: Secondary | ICD-10-CM | POA: Diagnosis not present

## 2022-07-19 DIAGNOSIS — S81802D Unspecified open wound, left lower leg, subsequent encounter: Secondary | ICD-10-CM | POA: Diagnosis not present

## 2022-07-19 DIAGNOSIS — I130 Hypertensive heart and chronic kidney disease with heart failure and stage 1 through stage 4 chronic kidney disease, or unspecified chronic kidney disease: Secondary | ICD-10-CM | POA: Diagnosis not present

## 2022-07-20 ENCOUNTER — Telehealth: Payer: Self-pay

## 2022-07-20 DIAGNOSIS — I5032 Chronic diastolic (congestive) heart failure: Secondary | ICD-10-CM | POA: Diagnosis not present

## 2022-07-20 DIAGNOSIS — S81801D Unspecified open wound, right lower leg, subsequent encounter: Secondary | ICD-10-CM | POA: Diagnosis not present

## 2022-07-20 DIAGNOSIS — N179 Acute kidney failure, unspecified: Secondary | ICD-10-CM | POA: Diagnosis not present

## 2022-07-20 DIAGNOSIS — S81802D Unspecified open wound, left lower leg, subsequent encounter: Secondary | ICD-10-CM | POA: Diagnosis not present

## 2022-07-20 DIAGNOSIS — I890 Lymphedema, not elsewhere classified: Secondary | ICD-10-CM | POA: Diagnosis not present

## 2022-07-20 DIAGNOSIS — I130 Hypertensive heart and chronic kidney disease with heart failure and stage 1 through stage 4 chronic kidney disease, or unspecified chronic kidney disease: Secondary | ICD-10-CM | POA: Diagnosis not present

## 2022-07-20 NOTE — Telephone Encounter (Signed)
Beth nurse with [facility] called Weight gain, swelling in legs above knee bilaterally. beth [phone]

## 2022-07-21 DIAGNOSIS — E876 Hypokalemia: Secondary | ICD-10-CM | POA: Diagnosis not present

## 2022-07-22 LAB — BASIC METABOLIC PANEL
BUN/Creatinine Ratio: 19 (ref 10–24)
BUN: 30 mg/dL — ABNORMAL HIGH (ref 8–27)
CO2: 28 mmol/L (ref 20–29)
Calcium: 9.3 mg/dL (ref 8.6–10.2)
Chloride: 92 mmol/L — ABNORMAL LOW (ref 96–106)
Creatinine, Ser: 1.62 mg/dL — ABNORMAL HIGH (ref 0.76–1.27)
Glucose: 104 mg/dL — ABNORMAL HIGH (ref 70–99)
Potassium: 3.8 mmol/L (ref 3.5–5.2)
Sodium: 138 mmol/L (ref 134–144)
eGFR: 42 mL/min/{1.73_m2} — ABNORMAL LOW (ref >59)

## 2022-07-22 LAB — MAGNESIUM: Magnesium: 2.3 mg/dL (ref 1.6–2.3)

## 2022-07-22 MED ORDER — METOLAZONE 2.5 MG PO TABS: 2.5000 mg | ORAL_TABLET | ORAL | 3 refills | Status: DC

## 2022-07-22 NOTE — Telephone Encounter (Signed)
Spoke with the patient's significant other [name]. Increase metolazone to 2.5 three times week (M, W, F) from twice weekly.  Thanks MJP

## 2022-07-22 NOTE — Addendum Note (Signed)
Addended by: Elder Negus on: 07/22/2022 02:47 PM   Modules accepted: Orders

## 2022-07-25 DIAGNOSIS — I5032 Chronic diastolic (congestive) heart failure: Secondary | ICD-10-CM | POA: Diagnosis not present

## 2022-07-25 DIAGNOSIS — I130 Hypertensive heart and chronic kidney disease with heart failure and stage 1 through stage 4 chronic kidney disease, or unspecified chronic kidney disease: Secondary | ICD-10-CM | POA: Diagnosis not present

## 2022-07-25 DIAGNOSIS — E876 Hypokalemia: Secondary | ICD-10-CM | POA: Diagnosis not present

## 2022-07-25 DIAGNOSIS — N189 Chronic kidney disease, unspecified: Secondary | ICD-10-CM | POA: Diagnosis not present

## 2022-07-25 DIAGNOSIS — S81801D Unspecified open wound, right lower leg, subsequent encounter: Secondary | ICD-10-CM | POA: Diagnosis not present

## 2022-07-25 DIAGNOSIS — G20A1 Parkinson's disease without dyskinesia, without mention of fluctuations: Secondary | ICD-10-CM | POA: Diagnosis not present

## 2022-07-25 DIAGNOSIS — Z9181 History of falling: Secondary | ICD-10-CM | POA: Diagnosis not present

## 2022-07-25 DIAGNOSIS — J9811 Atelectasis: Secondary | ICD-10-CM | POA: Diagnosis not present

## 2022-07-25 DIAGNOSIS — I4891 Unspecified atrial fibrillation: Secondary | ICD-10-CM | POA: Diagnosis not present

## 2022-07-25 DIAGNOSIS — I890 Lymphedema, not elsewhere classified: Secondary | ICD-10-CM | POA: Diagnosis not present

## 2022-07-25 DIAGNOSIS — S81802D Unspecified open wound, left lower leg, subsequent encounter: Secondary | ICD-10-CM | POA: Diagnosis not present

## 2022-07-25 DIAGNOSIS — Z7901 Long term (current) use of anticoagulants: Secondary | ICD-10-CM | POA: Diagnosis not present

## 2022-07-25 DIAGNOSIS — N179 Acute kidney failure, unspecified: Secondary | ICD-10-CM | POA: Diagnosis not present

## 2022-07-27 DIAGNOSIS — S81801D Unspecified open wound, right lower leg, subsequent encounter: Secondary | ICD-10-CM | POA: Diagnosis not present

## 2022-07-27 DIAGNOSIS — I890 Lymphedema, not elsewhere classified: Secondary | ICD-10-CM | POA: Diagnosis not present

## 2022-07-27 DIAGNOSIS — N179 Acute kidney failure, unspecified: Secondary | ICD-10-CM | POA: Diagnosis not present

## 2022-07-27 DIAGNOSIS — I130 Hypertensive heart and chronic kidney disease with heart failure and stage 1 through stage 4 chronic kidney disease, or unspecified chronic kidney disease: Secondary | ICD-10-CM | POA: Diagnosis not present

## 2022-07-27 DIAGNOSIS — S81802D Unspecified open wound, left lower leg, subsequent encounter: Secondary | ICD-10-CM | POA: Diagnosis not present

## 2022-07-27 DIAGNOSIS — I5032 Chronic diastolic (congestive) heart failure: Secondary | ICD-10-CM | POA: Diagnosis not present

## 2022-07-28 DIAGNOSIS — I482 Chronic atrial fibrillation, unspecified: Secondary | ICD-10-CM | POA: Diagnosis not present

## 2022-07-28 DIAGNOSIS — I272 Pulmonary hypertension, unspecified: Secondary | ICD-10-CM | POA: Diagnosis not present

## 2022-07-28 DIAGNOSIS — G629 Polyneuropathy, unspecified: Secondary | ICD-10-CM | POA: Diagnosis not present

## 2022-07-28 DIAGNOSIS — R600 Localized edema: Secondary | ICD-10-CM | POA: Diagnosis not present

## 2022-07-28 DIAGNOSIS — D509 Iron deficiency anemia, unspecified: Secondary | ICD-10-CM | POA: Diagnosis not present

## 2022-07-28 DIAGNOSIS — Z0000 Encounter for general adult medical examination without abnormal findings: Secondary | ICD-10-CM | POA: Diagnosis not present

## 2022-07-28 DIAGNOSIS — G20B2 Parkinson's disease with dyskinesia, with fluctuations: Secondary | ICD-10-CM | POA: Diagnosis not present

## 2022-07-28 DIAGNOSIS — Z8546 Personal history of malignant neoplasm of prostate: Secondary | ICD-10-CM | POA: Diagnosis not present

## 2022-07-28 DIAGNOSIS — Z1331 Encounter for screening for depression: Secondary | ICD-10-CM | POA: Diagnosis not present

## 2022-07-28 DIAGNOSIS — N1832 Chronic kidney disease, stage 3b: Secondary | ICD-10-CM | POA: Diagnosis not present

## 2022-07-28 DIAGNOSIS — E782 Mixed hyperlipidemia: Secondary | ICD-10-CM | POA: Diagnosis not present

## 2022-07-29 DIAGNOSIS — I890 Lymphedema, not elsewhere classified: Secondary | ICD-10-CM | POA: Diagnosis not present

## 2022-07-29 DIAGNOSIS — S81801D Unspecified open wound, right lower leg, subsequent encounter: Secondary | ICD-10-CM | POA: Diagnosis not present

## 2022-07-29 DIAGNOSIS — I130 Hypertensive heart and chronic kidney disease with heart failure and stage 1 through stage 4 chronic kidney disease, or unspecified chronic kidney disease: Secondary | ICD-10-CM | POA: Diagnosis not present

## 2022-07-29 DIAGNOSIS — S81802D Unspecified open wound, left lower leg, subsequent encounter: Secondary | ICD-10-CM | POA: Diagnosis not present

## 2022-07-29 DIAGNOSIS — I5032 Chronic diastolic (congestive) heart failure: Secondary | ICD-10-CM | POA: Diagnosis not present

## 2022-07-29 DIAGNOSIS — N179 Acute kidney failure, unspecified: Secondary | ICD-10-CM | POA: Diagnosis not present

## 2022-07-31 DIAGNOSIS — I890 Lymphedema, not elsewhere classified: Secondary | ICD-10-CM | POA: Diagnosis not present

## 2022-07-31 DIAGNOSIS — I130 Hypertensive heart and chronic kidney disease with heart failure and stage 1 through stage 4 chronic kidney disease, or unspecified chronic kidney disease: Secondary | ICD-10-CM | POA: Diagnosis not present

## 2022-07-31 DIAGNOSIS — I5032 Chronic diastolic (congestive) heart failure: Secondary | ICD-10-CM | POA: Diagnosis not present

## 2022-07-31 DIAGNOSIS — S81802D Unspecified open wound, left lower leg, subsequent encounter: Secondary | ICD-10-CM | POA: Diagnosis not present

## 2022-07-31 DIAGNOSIS — N179 Acute kidney failure, unspecified: Secondary | ICD-10-CM | POA: Diagnosis not present

## 2022-07-31 DIAGNOSIS — S81801D Unspecified open wound, right lower leg, subsequent encounter: Secondary | ICD-10-CM | POA: Diagnosis not present

## 2022-08-01 DIAGNOSIS — I5032 Chronic diastolic (congestive) heart failure: Secondary | ICD-10-CM | POA: Diagnosis not present

## 2022-08-01 DIAGNOSIS — I130 Hypertensive heart and chronic kidney disease with heart failure and stage 1 through stage 4 chronic kidney disease, or unspecified chronic kidney disease: Secondary | ICD-10-CM | POA: Diagnosis not present

## 2022-08-01 DIAGNOSIS — S81802D Unspecified open wound, left lower leg, subsequent encounter: Secondary | ICD-10-CM | POA: Diagnosis not present

## 2022-08-01 DIAGNOSIS — N179 Acute kidney failure, unspecified: Secondary | ICD-10-CM | POA: Diagnosis not present

## 2022-08-01 DIAGNOSIS — I890 Lymphedema, not elsewhere classified: Secondary | ICD-10-CM | POA: Diagnosis not present

## 2022-08-01 DIAGNOSIS — S81801D Unspecified open wound, right lower leg, subsequent encounter: Secondary | ICD-10-CM | POA: Diagnosis not present

## 2022-08-02 DIAGNOSIS — I130 Hypertensive heart and chronic kidney disease with heart failure and stage 1 through stage 4 chronic kidney disease, or unspecified chronic kidney disease: Secondary | ICD-10-CM | POA: Diagnosis not present

## 2022-08-02 DIAGNOSIS — S81802D Unspecified open wound, left lower leg, subsequent encounter: Secondary | ICD-10-CM | POA: Diagnosis not present

## 2022-08-02 DIAGNOSIS — S81801D Unspecified open wound, right lower leg, subsequent encounter: Secondary | ICD-10-CM | POA: Diagnosis not present

## 2022-08-02 DIAGNOSIS — N179 Acute kidney failure, unspecified: Secondary | ICD-10-CM | POA: Diagnosis not present

## 2022-08-02 DIAGNOSIS — I5032 Chronic diastolic (congestive) heart failure: Secondary | ICD-10-CM | POA: Diagnosis not present

## 2022-08-02 DIAGNOSIS — I890 Lymphedema, not elsewhere classified: Secondary | ICD-10-CM | POA: Diagnosis not present

## 2022-08-04 ENCOUNTER — Ambulatory Visit (INDEPENDENT_AMBULATORY_CARE_PROVIDER_SITE_OTHER): Payer: Medicare Other

## 2022-08-04 ENCOUNTER — Ambulatory Visit (INDEPENDENT_AMBULATORY_CARE_PROVIDER_SITE_OTHER): Payer: Medicare Other | Admitting: Podiatry

## 2022-08-04 DIAGNOSIS — M60271 Foreign body granuloma of soft tissue, not elsewhere classified, right ankle and foot: Secondary | ICD-10-CM

## 2022-08-04 DIAGNOSIS — B353 Tinea pedis: Secondary | ICD-10-CM

## 2022-08-04 DIAGNOSIS — I5032 Chronic diastolic (congestive) heart failure: Secondary | ICD-10-CM | POA: Diagnosis not present

## 2022-08-04 DIAGNOSIS — L909 Atrophic disorder of skin, unspecified: Secondary | ICD-10-CM | POA: Diagnosis not present

## 2022-08-04 DIAGNOSIS — I130 Hypertensive heart and chronic kidney disease with heart failure and stage 1 through stage 4 chronic kidney disease, or unspecified chronic kidney disease: Secondary | ICD-10-CM | POA: Diagnosis not present

## 2022-08-04 DIAGNOSIS — M79674 Pain in right toe(s): Secondary | ICD-10-CM

## 2022-08-04 DIAGNOSIS — I890 Lymphedema, not elsewhere classified: Secondary | ICD-10-CM | POA: Diagnosis not present

## 2022-08-04 DIAGNOSIS — S81801D Unspecified open wound, right lower leg, subsequent encounter: Secondary | ICD-10-CM | POA: Diagnosis not present

## 2022-08-04 DIAGNOSIS — M898X9 Other specified disorders of bone, unspecified site: Secondary | ICD-10-CM | POA: Diagnosis not present

## 2022-08-04 DIAGNOSIS — S81802D Unspecified open wound, left lower leg, subsequent encounter: Secondary | ICD-10-CM | POA: Diagnosis not present

## 2022-08-04 DIAGNOSIS — B351 Tinea unguium: Secondary | ICD-10-CM

## 2022-08-04 DIAGNOSIS — M79675 Pain in left toe(s): Secondary | ICD-10-CM | POA: Diagnosis not present

## 2022-08-04 DIAGNOSIS — N179 Acute kidney failure, unspecified: Secondary | ICD-10-CM | POA: Diagnosis not present

## 2022-08-04 NOTE — Patient Instructions (Signed)
Use a Tuli's heel cup in the shoes to prevent pressure on the heel   Look for Voltaren gel at the pharmacy over the counter or online (also known as diclofenac 1% gel). Apply to the painful areas 3-4x daily with the supplied dosing card. Allow to dry for 10 minutes before going into socks/shoes

## 2022-08-08 DIAGNOSIS — I5032 Chronic diastolic (congestive) heart failure: Secondary | ICD-10-CM | POA: Diagnosis not present

## 2022-08-08 DIAGNOSIS — I130 Hypertensive heart and chronic kidney disease with heart failure and stage 1 through stage 4 chronic kidney disease, or unspecified chronic kidney disease: Secondary | ICD-10-CM | POA: Diagnosis not present

## 2022-08-08 DIAGNOSIS — S81801D Unspecified open wound, right lower leg, subsequent encounter: Secondary | ICD-10-CM | POA: Diagnosis not present

## 2022-08-08 DIAGNOSIS — I890 Lymphedema, not elsewhere classified: Secondary | ICD-10-CM | POA: Diagnosis not present

## 2022-08-08 DIAGNOSIS — N179 Acute kidney failure, unspecified: Secondary | ICD-10-CM | POA: Diagnosis not present

## 2022-08-08 DIAGNOSIS — S81802D Unspecified open wound, left lower leg, subsequent encounter: Secondary | ICD-10-CM | POA: Diagnosis not present

## 2022-08-08 MED ORDER — KETOCONAZOLE 2 % EX CREA: 1.0000 | TOPICAL_CREAM | Freq: Every day | CUTANEOUS | 2 refills | Status: DC

## 2022-08-08 NOTE — Progress Notes (Signed)
  Subjective:  Patient ID: [name], male    DOB: 12/12/1935,  MRN: [id number]  Chief Complaint  Patient presents with   Foot Pain    np bil heel pain/ right has black spot ( right worse) ( hx of bil lymphedema in both legs) - patient uses lymphedema pumps on both legs daily, he also has adjustable compression wraps. He has a history of ulcer on the back of his right heel. The skin looks like it's peeling off on both heels. He sleeps in a lift chair and has for many years.    Nail Problem    Requesting nail trim - patient takes blood thinners    87 y.o. male presents with the above complaint. History confirmed with patient.  He does not do a large amount of walking currently  Objective:  Physical Exam: warm, good capillary refill, no trophic changes or ulcerative lesions, normal DP and PT pulses, and normal sensory exam. Left Foot: dystrophic yellowed discolored nail plates with subungual debris and dry peeling skin plantar right heel Right Foot: dystrophic yellowed discolored nail plates with subungual debris and dry peeling skin plantar right heel  No images are attached to the encounter.  Radiographs: Multiple views x-ray of the right foot: no fracture, dislocation, swelling or degenerative changes noted Assessment:   1. Bone pain   2. Pain due to onychomycosis of toenails of both feet   3. Tinea pedis of both feet   4. Fat pad atrophy of foot      Plan:  Patient was evaluated and treated and all questions answered.  Discussed the etiology and treatment options for the condition in detail with the patient.  Recommended debridement of the nails today. Sharp and mechanical debridement performed of all painful and mycotic nails today. Nails debrided in length and thickness using a nail nipper to level of comfort. Discussed treatment options including appropriate shoe gear. Follow up as needed for painful nails.  Does have an area of dry peeling tender skin.  Rx sent  for ketoconazole in the event that this is tinea pedis.  We did take an x-ray and there is no bony spur or acute osseous abnormality.  He does have fat pad atrophy and thinning of the area.  Discussed supportive shoe gear and heel cushioning.  Return if symptoms worsen or fail to improve.

## 2022-08-10 DIAGNOSIS — I890 Lymphedema, not elsewhere classified: Secondary | ICD-10-CM | POA: Diagnosis not present

## 2022-08-10 DIAGNOSIS — N179 Acute kidney failure, unspecified: Secondary | ICD-10-CM | POA: Diagnosis not present

## 2022-08-10 DIAGNOSIS — I130 Hypertensive heart and chronic kidney disease with heart failure and stage 1 through stage 4 chronic kidney disease, or unspecified chronic kidney disease: Secondary | ICD-10-CM | POA: Diagnosis not present

## 2022-08-10 DIAGNOSIS — I5032 Chronic diastolic (congestive) heart failure: Secondary | ICD-10-CM | POA: Diagnosis not present

## 2022-08-10 DIAGNOSIS — S81801D Unspecified open wound, right lower leg, subsequent encounter: Secondary | ICD-10-CM | POA: Diagnosis not present

## 2022-08-10 DIAGNOSIS — S81802D Unspecified open wound, left lower leg, subsequent encounter: Secondary | ICD-10-CM | POA: Diagnosis not present

## 2022-08-17 DIAGNOSIS — S81802D Unspecified open wound, left lower leg, subsequent encounter: Secondary | ICD-10-CM | POA: Diagnosis not present

## 2022-08-17 DIAGNOSIS — N179 Acute kidney failure, unspecified: Secondary | ICD-10-CM | POA: Diagnosis not present

## 2022-08-17 DIAGNOSIS — I130 Hypertensive heart and chronic kidney disease with heart failure and stage 1 through stage 4 chronic kidney disease, or unspecified chronic kidney disease: Secondary | ICD-10-CM | POA: Diagnosis not present

## 2022-08-17 DIAGNOSIS — I5032 Chronic diastolic (congestive) heart failure: Secondary | ICD-10-CM | POA: Diagnosis not present

## 2022-08-17 DIAGNOSIS — I890 Lymphedema, not elsewhere classified: Secondary | ICD-10-CM | POA: Diagnosis not present

## 2022-08-17 DIAGNOSIS — S81801D Unspecified open wound, right lower leg, subsequent encounter: Secondary | ICD-10-CM | POA: Diagnosis not present

## 2022-08-19 DIAGNOSIS — S81801D Unspecified open wound, right lower leg, subsequent encounter: Secondary | ICD-10-CM | POA: Diagnosis not present

## 2022-08-19 DIAGNOSIS — S81802D Unspecified open wound, left lower leg, subsequent encounter: Secondary | ICD-10-CM | POA: Diagnosis not present

## 2022-08-19 DIAGNOSIS — I890 Lymphedema, not elsewhere classified: Secondary | ICD-10-CM | POA: Diagnosis not present

## 2022-08-19 DIAGNOSIS — I130 Hypertensive heart and chronic kidney disease with heart failure and stage 1 through stage 4 chronic kidney disease, or unspecified chronic kidney disease: Secondary | ICD-10-CM | POA: Diagnosis not present

## 2022-08-19 DIAGNOSIS — N179 Acute kidney failure, unspecified: Secondary | ICD-10-CM | POA: Diagnosis not present

## 2022-08-19 DIAGNOSIS — I5032 Chronic diastolic (congestive) heart failure: Secondary | ICD-10-CM | POA: Diagnosis not present

## 2022-09-09 ENCOUNTER — Other Ambulatory Visit: Payer: Self-pay | Admitting: Neurology

## 2022-09-09 DIAGNOSIS — G20A1 Parkinson's disease without dyskinesia, without mention of fluctuations: Secondary | ICD-10-CM

## 2022-09-14 DIAGNOSIS — N183 Chronic kidney disease, stage 3 unspecified: Secondary | ICD-10-CM | POA: Diagnosis not present

## 2022-09-20 DIAGNOSIS — I50810 Right heart failure, unspecified: Secondary | ICD-10-CM | POA: Diagnosis not present

## 2022-09-20 DIAGNOSIS — E876 Hypokalemia: Secondary | ICD-10-CM | POA: Diagnosis not present

## 2022-09-20 DIAGNOSIS — R531 Weakness: Secondary | ICD-10-CM | POA: Diagnosis not present

## 2022-09-20 DIAGNOSIS — I129 Hypertensive chronic kidney disease with stage 1 through stage 4 chronic kidney disease, or unspecified chronic kidney disease: Secondary | ICD-10-CM | POA: Diagnosis not present

## 2022-09-20 DIAGNOSIS — I872 Venous insufficiency (chronic) (peripheral): Secondary | ICD-10-CM | POA: Diagnosis not present

## 2022-09-20 DIAGNOSIS — N183 Chronic kidney disease, stage 3 unspecified: Secondary | ICD-10-CM | POA: Diagnosis not present

## 2022-09-20 DIAGNOSIS — W19XXXA Unspecified fall, initial encounter: Secondary | ICD-10-CM | POA: Diagnosis not present

## 2022-09-22 DIAGNOSIS — G20A1 Parkinson's disease without dyskinesia, without mention of fluctuations: Secondary | ICD-10-CM | POA: Diagnosis not present

## 2022-09-22 DIAGNOSIS — R600 Localized edema: Secondary | ICD-10-CM | POA: Diagnosis not present

## 2022-09-22 DIAGNOSIS — I2510 Atherosclerotic heart disease of native coronary artery without angina pectoris: Secondary | ICD-10-CM | POA: Diagnosis not present

## 2022-09-22 DIAGNOSIS — R296 Repeated falls: Secondary | ICD-10-CM | POA: Diagnosis not present

## 2022-09-22 DIAGNOSIS — R29898 Other symptoms and signs involving the musculoskeletal system: Secondary | ICD-10-CM | POA: Diagnosis not present

## 2022-09-22 DIAGNOSIS — I4819 Other persistent atrial fibrillation: Secondary | ICD-10-CM | POA: Diagnosis not present

## 2022-09-22 DIAGNOSIS — G629 Polyneuropathy, unspecified: Secondary | ICD-10-CM | POA: Diagnosis not present

## 2022-09-25 DIAGNOSIS — Z955 Presence of coronary angioplasty implant and graft: Secondary | ICD-10-CM | POA: Diagnosis not present

## 2022-09-25 DIAGNOSIS — I4892 Unspecified atrial flutter: Secondary | ICD-10-CM | POA: Diagnosis not present

## 2022-09-25 DIAGNOSIS — N529 Male erectile dysfunction, unspecified: Secondary | ICD-10-CM | POA: Diagnosis not present

## 2022-09-25 DIAGNOSIS — Z7901 Long term (current) use of anticoagulants: Secondary | ICD-10-CM | POA: Diagnosis not present

## 2022-09-25 DIAGNOSIS — E782 Mixed hyperlipidemia: Secondary | ICD-10-CM | POA: Diagnosis not present

## 2022-09-25 DIAGNOSIS — I272 Pulmonary hypertension, unspecified: Secondary | ICD-10-CM | POA: Diagnosis not present

## 2022-09-25 DIAGNOSIS — Z604 Social exclusion and rejection: Secondary | ICD-10-CM | POA: Diagnosis not present

## 2022-09-25 DIAGNOSIS — I080 Rheumatic disorders of both mitral and aortic valves: Secondary | ICD-10-CM | POA: Diagnosis not present

## 2022-09-25 DIAGNOSIS — I509 Heart failure, unspecified: Secondary | ICD-10-CM | POA: Diagnosis not present

## 2022-09-25 DIAGNOSIS — I4819 Other persistent atrial fibrillation: Secondary | ICD-10-CM | POA: Diagnosis not present

## 2022-09-25 DIAGNOSIS — Z7984 Long term (current) use of oral hypoglycemic drugs: Secondary | ICD-10-CM | POA: Diagnosis not present

## 2022-09-25 DIAGNOSIS — Z8711 Personal history of peptic ulcer disease: Secondary | ICD-10-CM | POA: Diagnosis not present

## 2022-09-25 DIAGNOSIS — N1832 Chronic kidney disease, stage 3b: Secondary | ICD-10-CM | POA: Diagnosis not present

## 2022-09-25 DIAGNOSIS — I130 Hypertensive heart and chronic kidney disease with heart failure and stage 1 through stage 4 chronic kidney disease, or unspecified chronic kidney disease: Secondary | ICD-10-CM | POA: Diagnosis not present

## 2022-09-25 DIAGNOSIS — Z556 Problems related to health literacy: Secondary | ICD-10-CM | POA: Diagnosis not present

## 2022-09-25 DIAGNOSIS — G20A1 Parkinson's disease without dyskinesia, without mention of fluctuations: Secondary | ICD-10-CM | POA: Diagnosis not present

## 2022-09-25 DIAGNOSIS — Z85820 Personal history of malignant melanoma of skin: Secondary | ICD-10-CM | POA: Diagnosis not present

## 2022-09-25 DIAGNOSIS — Z86010 Personal history of colonic polyps: Secondary | ICD-10-CM | POA: Diagnosis not present

## 2022-09-25 DIAGNOSIS — I2510 Atherosclerotic heart disease of native coronary artery without angina pectoris: Secondary | ICD-10-CM | POA: Diagnosis not present

## 2022-09-25 DIAGNOSIS — G629 Polyneuropathy, unspecified: Secondary | ICD-10-CM | POA: Diagnosis not present

## 2022-09-29 DIAGNOSIS — I509 Heart failure, unspecified: Secondary | ICD-10-CM | POA: Diagnosis not present

## 2022-09-29 DIAGNOSIS — G20A1 Parkinson's disease without dyskinesia, without mention of fluctuations: Secondary | ICD-10-CM | POA: Diagnosis not present

## 2022-09-29 DIAGNOSIS — I4892 Unspecified atrial flutter: Secondary | ICD-10-CM | POA: Diagnosis not present

## 2022-09-29 DIAGNOSIS — I4819 Other persistent atrial fibrillation: Secondary | ICD-10-CM | POA: Diagnosis not present

## 2022-09-29 DIAGNOSIS — N1832 Chronic kidney disease, stage 3b: Secondary | ICD-10-CM | POA: Diagnosis not present

## 2022-09-29 DIAGNOSIS — I130 Hypertensive heart and chronic kidney disease with heart failure and stage 1 through stage 4 chronic kidney disease, or unspecified chronic kidney disease: Secondary | ICD-10-CM | POA: Diagnosis not present

## 2022-09-30 DIAGNOSIS — I130 Hypertensive heart and chronic kidney disease with heart failure and stage 1 through stage 4 chronic kidney disease, or unspecified chronic kidney disease: Secondary | ICD-10-CM | POA: Diagnosis not present

## 2022-09-30 DIAGNOSIS — N1832 Chronic kidney disease, stage 3b: Secondary | ICD-10-CM | POA: Diagnosis not present

## 2022-09-30 DIAGNOSIS — I509 Heart failure, unspecified: Secondary | ICD-10-CM | POA: Diagnosis not present

## 2022-09-30 DIAGNOSIS — I4819 Other persistent atrial fibrillation: Secondary | ICD-10-CM | POA: Diagnosis not present

## 2022-09-30 DIAGNOSIS — G20A1 Parkinson's disease without dyskinesia, without mention of fluctuations: Secondary | ICD-10-CM | POA: Diagnosis not present

## 2022-09-30 DIAGNOSIS — I4892 Unspecified atrial flutter: Secondary | ICD-10-CM | POA: Diagnosis not present

## 2022-10-03 DIAGNOSIS — I4819 Other persistent atrial fibrillation: Secondary | ICD-10-CM | POA: Diagnosis not present

## 2022-10-03 DIAGNOSIS — G20A1 Parkinson's disease without dyskinesia, without mention of fluctuations: Secondary | ICD-10-CM | POA: Diagnosis not present

## 2022-10-03 DIAGNOSIS — N1832 Chronic kidney disease, stage 3b: Secondary | ICD-10-CM | POA: Diagnosis not present

## 2022-10-03 DIAGNOSIS — I509 Heart failure, unspecified: Secondary | ICD-10-CM | POA: Diagnosis not present

## 2022-10-03 DIAGNOSIS — I130 Hypertensive heart and chronic kidney disease with heart failure and stage 1 through stage 4 chronic kidney disease, or unspecified chronic kidney disease: Secondary | ICD-10-CM | POA: Diagnosis not present

## 2022-10-03 DIAGNOSIS — I4892 Unspecified atrial flutter: Secondary | ICD-10-CM | POA: Diagnosis not present

## 2022-10-04 DIAGNOSIS — G20A1 Parkinson's disease without dyskinesia, without mention of fluctuations: Secondary | ICD-10-CM | POA: Diagnosis not present

## 2022-10-04 DIAGNOSIS — I4892 Unspecified atrial flutter: Secondary | ICD-10-CM | POA: Diagnosis not present

## 2022-10-04 DIAGNOSIS — N1832 Chronic kidney disease, stage 3b: Secondary | ICD-10-CM | POA: Diagnosis not present

## 2022-10-04 DIAGNOSIS — I4819 Other persistent atrial fibrillation: Secondary | ICD-10-CM | POA: Diagnosis not present

## 2022-10-04 DIAGNOSIS — I130 Hypertensive heart and chronic kidney disease with heart failure and stage 1 through stage 4 chronic kidney disease, or unspecified chronic kidney disease: Secondary | ICD-10-CM | POA: Diagnosis not present

## 2022-10-04 DIAGNOSIS — I509 Heart failure, unspecified: Secondary | ICD-10-CM | POA: Diagnosis not present

## 2022-10-05 ENCOUNTER — Telehealth: Payer: Self-pay | Admitting: Neurology

## 2022-10-05 DIAGNOSIS — I509 Heart failure, unspecified: Secondary | ICD-10-CM | POA: Diagnosis not present

## 2022-10-05 DIAGNOSIS — I4892 Unspecified atrial flutter: Secondary | ICD-10-CM | POA: Diagnosis not present

## 2022-10-05 DIAGNOSIS — G20A1 Parkinson's disease without dyskinesia, without mention of fluctuations: Secondary | ICD-10-CM | POA: Diagnosis not present

## 2022-10-05 DIAGNOSIS — I4819 Other persistent atrial fibrillation: Secondary | ICD-10-CM | POA: Diagnosis not present

## 2022-10-05 DIAGNOSIS — N1832 Chronic kidney disease, stage 3b: Secondary | ICD-10-CM | POA: Diagnosis not present

## 2022-10-05 DIAGNOSIS — I130 Hypertensive heart and chronic kidney disease with heart failure and stage 1 through stage 4 chronic kidney disease, or unspecified chronic kidney disease: Secondary | ICD-10-CM | POA: Diagnosis not present

## 2022-10-05 NOTE — Telephone Encounter (Signed)
Pts partner is calling in stating that she only wants to speak with Dr. Arbutus Leas about the pts declining condition and she is aware that the provider is seeing pts.  She stated that she would did not want to make an appointment she would like to speak with her over the phone.

## 2022-10-05 NOTE — Telephone Encounter (Signed)
I have spoken to patients partner [name] who stated she had spoken to Dr. Katrinka Blazing regarding the fact that patient is having repeated falls. Dr. Katrinka Blazing has told patients partner [name] to not go to ED as they can not help with the issues patient is having with falls and cognitive decline. Dr. Katrinka Blazing told [name] to call and ask to speak to Dr. Arbutus Leas on the phone and request she change patients medications. I told [name] Dr. Arbutus Leas and office policy states that the Dr. will not be calling her to give advice over the phone. She can make an in person or a virtual appointment . [name] stated patient can not move from his recliner at which time she said home health nurse was there did I want to talk with her. I again stated that she would need to make an in person or virtual appointment if the patient was having a significant decline or he needed to go to the ED were they do know how to treat patients with parkinson's disease in a crisis. [name] was very aggressive and told me to please be quiet and listen she wanted to speak to Dr. Arbutus Leas that they have been coming here for years and she wanted to speak to her.

## 2022-10-05 NOTE — Telephone Encounter (Signed)
Caller requesting to speak with Dr Tat about [name]'s condition

## 2022-10-06 ENCOUNTER — Telehealth (INDEPENDENT_AMBULATORY_CARE_PROVIDER_SITE_OTHER): Payer: Medicare Other | Admitting: Neurology

## 2022-10-06 DIAGNOSIS — I130 Hypertensive heart and chronic kidney disease with heart failure and stage 1 through stage 4 chronic kidney disease, or unspecified chronic kidney disease: Secondary | ICD-10-CM | POA: Diagnosis not present

## 2022-10-06 DIAGNOSIS — I4819 Other persistent atrial fibrillation: Secondary | ICD-10-CM | POA: Diagnosis not present

## 2022-10-06 DIAGNOSIS — G20B1 Parkinson's disease with dyskinesia, without mention of fluctuations: Secondary | ICD-10-CM | POA: Diagnosis not present

## 2022-10-06 DIAGNOSIS — G901 Familial dysautonomia [Riley-Day]: Secondary | ICD-10-CM

## 2022-10-06 DIAGNOSIS — I890 Lymphedema, not elsewhere classified: Secondary | ICD-10-CM

## 2022-10-06 DIAGNOSIS — R441 Visual hallucinations: Secondary | ICD-10-CM

## 2022-10-06 DIAGNOSIS — N1832 Chronic kidney disease, stage 3b: Secondary | ICD-10-CM | POA: Diagnosis not present

## 2022-10-06 DIAGNOSIS — I4892 Unspecified atrial flutter: Secondary | ICD-10-CM | POA: Diagnosis not present

## 2022-10-06 DIAGNOSIS — I509 Heart failure, unspecified: Secondary | ICD-10-CM | POA: Diagnosis not present

## 2022-10-06 DIAGNOSIS — G20A1 Parkinson's disease without dyskinesia, without mention of fluctuations: Secondary | ICD-10-CM | POA: Diagnosis not present

## 2022-10-06 NOTE — Patient Instructions (Signed)
 SAVE THE DATE!  We are planning a Parkinsons Disease educational symposium at [facility] in [place] on October 11.  More details to come!  If you would like to be added to our email list to get further information, email [email or web address].  We hope to see you there!

## 2022-10-06 NOTE — Progress Notes (Signed)
Virtual Visit Via Video       Consent was obtained for video visit:  Yes.   Answered questions that patient had about telehealth interaction:  Yes.   I discussed the limitations, risks, security and privacy concerns of performing an evaluation and management service by telemedicine. I also discussed with the patient that there may be a patient responsible charge related to this service. The patient expressed understanding and agreed to proceed.  Pt location: Home Physician Location: office Name of referring provider:  Merri Brunette, MD I connected with [name] at patients initiation/request on 10/06/2022 at  3:30 PM EDT by video enabled telemedicine application and verified that I am speaking with the correct person using two identifiers. Pt MRN:  [id number] Pt DOB:  1935-03-25 Video Participants:  [name];  wife supplies most of hx  Assessment/Plan:   1.  Parkinsons Disease  -Continue carbidopa/levodopa 25/100, 2 tablets 3 times per day.  Can take extra 1/2 to 1 prn.  -Follows with Dr. Yetta Barre for dermatology.    -sleeping a lot in day but doesn't move from chair.  Has become significantly deconditioned but this didn't happen fast.  We noted it at our prior visit.  Part of that is because of the lymphedema in the legs  -The biggest issue with deconditioning is long-term resistance to therapies and recommendations.  We have recommended for a long time consistent therapy and exercise programs, which have been declined.  The patient declined last visit and then ended up in the hospital almost immediately after I saw him with a fall.  Short-term rehab was recommended at a nursing facility because of the degree of which he was deconditioned and patient declined, per hospital records.  Home therapy was arranged but its likely just too far progressed to make a big difference now with his multiple issues.  I did tell them that adding more levodopa would not help  balance  2.  Renal insufficiency  -Follows with nephrology  3.  Atrial fibrillation  -On Eliquis  -Follows with cardiology  4.  Feet pain paresthesias   -Continue gabapentin, 300 mg 3 times daily.  5.  Head dyskinesia  -not bothersome to the patient (wife notices more than he does)  -hold on amantadine as not bothersome to the patient and risks of med>benefits  6.  Jaw closing dystonia  -occasionally happens but rare.  No botox required  7.  Lymphedema/venous insufficiency  -Following with wound care and vascular surgery  -using lymphedema pump   -As above, this is part of the issue with not being able to ambulate and deconditioning.  8.  Hallucinations  -discussed nuplazid along with risks and benefits and blackbox warning.  However, I do not want to start until after he gets his urinalysis and I have results of his lab work.  His wife is to get me the results, once completed.  Primary care is apparently ordering if we decide to start Nuplazid, I told her that they could come and pick up samples.  10.  BP fluctuations - ? Dysautonomia  -they are to get [place] the BP log   -pt does follow with cardiology and might need the assist of cardiology given how complicated he is  Subjective:   [name] was seen today in follow up for Parkinsons disease.  My previous records were reviewed prior to todays visit as well as outside records available to me.  Patient worked in today at the request  of spouse.  She wanted a visit without him, which obviously was not possible.  Patient has been deteriorating and has not been getting up out of his chair much.  He has consistently declined physical therapy, including last visit.  He was in the hospital not long after I last saw him for a fall.  He was severely hypokalemic.  He had acute on chronic kidney injury.  Short-term rehab at a nursing facility was recommended, but was declined by patient.  [name] initially and [facility]  is coming out now.  They just started coming to the home this week.  He is unable to stand/walk and PT is working with him on that.  Last visit, we noted that some of this was because of his lymphedema and the wife states that this continues to be a problem.  He is hallucinating - he will see people in the home and see people in the backyard that aren't there.  He had labs a few weeks ago, but did not have a urinalysis.  They state that they are awaiting a UA to get done from nurse when she comes on Friday.  They mention that PT and nursing have been concerned about his lower BP and they have been told it could be etiology of falls (although states that he also does not walk)..  Wife states that it has been going high and lows but she doesn't know the numbers.  He has a cardiologist and is on zaroxylyn for the lymphedema.    Current prescribed movement disorder medications: Carbidopa/levodopa 25/100, 2 tablets at 7 AM/11 AM/4 PM Gabapentin, 300 mg 3 times per day   PREVIOUS MEDICATIONS: Gabapentin for peripheral neuropathy  ALLERGIES:   Allergies  Allergen Reactions   Nsaids Other (See Comments)    Cannot have any of these because he is currently taking Eliquis    CURRENT MEDICATIONS:  Outpatient Encounter Medications as of 10/06/2022  Medication Sig   acetaminophen (TYLENOL) 325 MG tablet Take 1-2 tablets (325-650 mg total) by mouth every 6 (six) hours as needed for mild pain (pain score 1-3 or temp > 100.5).   apixaban (ELIQUIS) 2.5 MG TABS tablet Take 1 tablet (2.5 mg total) by mouth 2 (two) times daily. (0800 & 2000) (Patient taking differently: Take 2.5 mg by mouth 2 (two) times daily.)   carbidopa-levodopa (SINEMET IR) 25-100 MG tablet TAKE 2 TABLETS THREE TIMES DAILY AT 7:00AM, 11:00AM AND 4:00PM CAN TAKE EXTRA 1/2 TO 1 TABLET AS NEEDED   Cholecalciferol (VITAMIN D-3) 1000 units CAPS Take 1,000 Units by mouth daily. (0800)   dapagliflozin propanediol (FARXIGA) 10 MG TABS tablet Take 1  tablet by mouth daily.   diltiazem (CARDIZEM CD) 120 MG 24 hr capsule TAKE 1 CAPSULE EVERY DAY (Patient taking differently: Take 120 mg by mouth daily.)   doxycycline (VIBRAMYCIN) 100 MG capsule Take 100 mg by mouth 2 (two) times daily.   furosemide (LASIX) 40 MG tablet Take 1 tablet (40 mg total) by mouth 2 (two) times daily.   gabapentin (NEURONTIN) 300 MG capsule Take 1 capsule (300 mg total) by mouth 3 (three) times daily. (Patient taking differently: Take 300 mg by mouth daily.)   ketoconazole (NIZORAL) 2 % cream Apply 1 Application topically daily. To both heels   metolazone (ZAROXOLYN) 2.5 MG tablet Take 1 tablet (2.5 mg total) by mouth as directed. M, W, F   Multiple Vitamins-Minerals (PRESERVISION AREDS PO) Take 1 tablet by mouth in the morning and at  bedtime.   nitroGLYCERIN (NITROSTAT) 0.4 MG SL tablet TAKE 1 TABLET EVERY 5 MINUTES AS NEEDED FOR CHEST PAIN (Patient taking differently: Place 0.4 mg under the tongue every 5 (five) minutes as needed for chest pain.)   potassium chloride 20 MEQ TBCR Take 2 tablets (40 mEq total) by mouth 3 (three) times daily.   potassium chloride SA (KLOR-CON M) 20 MEQ tablet Take by mouth.   rosuvastatin (CRESTOR) 10 MG tablet Take 10 mg by mouth daily at 8 pm. (2000)   No facility-administered encounter medications on file as of 10/06/2022.    Objective:   PHYSICAL EXAMINATION:    VITALS:   There were no vitals filed for this visit.    GEN:  The patient appears stated age and is in NAD. HEENT:  Normocephalic, atraumatic.   Exts:  Legs are wrapped  Neurological examination:  Orientation: The patient is alert and oriented x3. Cranial nerves: There is good facial symmetry with facial hypomimia. The speech is fluent and clear. Soft palate rises symmetrically and there is no tongue deviation. Hearing is intact to conversational tone. Motor: Strength is at least antigravity x4.    Movement examination: Abnormal movements: Mild dyskinesia of the  head was noted today. Coordination:  There is mild decremation with finger taps Gait and Station: unable per wife  I have reviewed and interpreted the following labs independently    Chemistry      Component Value Date/Time   NA 138 07/21/2022 1445   K 3.8 07/21/2022 1445   CL 92 (L) 07/21/2022 1445   CO2 28 07/21/2022 1445   BUN 30 (H) 07/21/2022 1445   CREATININE 1.62 (H) 07/21/2022 1445      Component Value Date/Time   CALCIUM 9.3 07/21/2022 1445   ALKPHOS 140 (H) 06/21/2022 0605   AST 17 06/21/2022 0605   ALT 5 06/21/2022 0605   BILITOT 2.5 (H) 06/21/2022 0605       Lab Results  Component Value Date   WBC 5.2 06/22/2022   HGB 11.8 (L) 06/22/2022   HCT 36.2 (L) 06/22/2022   MCV 89.6 06/22/2022   PLT 197 06/22/2022    Lab Results  Component Value Date   TSH 2.94 09/15/2016     Total time spent on today's visit was 40 minutes, including both face-to-face time and nonface-to-face time.  Time included that spent on review of records (prior notes available to me/labs/imaging if pertinent), discussing treatment and goals, answering patient's questions and coordinating care.  Cc:  Merri Brunette, MD

## 2022-10-07 DIAGNOSIS — G20A1 Parkinson's disease without dyskinesia, without mention of fluctuations: Secondary | ICD-10-CM | POA: Diagnosis not present

## 2022-10-07 DIAGNOSIS — N390 Urinary tract infection, site not specified: Secondary | ICD-10-CM | POA: Diagnosis not present

## 2022-10-07 DIAGNOSIS — I4819 Other persistent atrial fibrillation: Secondary | ICD-10-CM | POA: Diagnosis not present

## 2022-10-07 DIAGNOSIS — I509 Heart failure, unspecified: Secondary | ICD-10-CM | POA: Diagnosis not present

## 2022-10-07 DIAGNOSIS — I130 Hypertensive heart and chronic kidney disease with heart failure and stage 1 through stage 4 chronic kidney disease, or unspecified chronic kidney disease: Secondary | ICD-10-CM | POA: Diagnosis not present

## 2022-10-07 DIAGNOSIS — I4892 Unspecified atrial flutter: Secondary | ICD-10-CM | POA: Diagnosis not present

## 2022-10-07 DIAGNOSIS — N1832 Chronic kidney disease, stage 3b: Secondary | ICD-10-CM | POA: Diagnosis not present

## 2022-10-10 DIAGNOSIS — I4819 Other persistent atrial fibrillation: Secondary | ICD-10-CM | POA: Diagnosis not present

## 2022-10-10 DIAGNOSIS — I130 Hypertensive heart and chronic kidney disease with heart failure and stage 1 through stage 4 chronic kidney disease, or unspecified chronic kidney disease: Secondary | ICD-10-CM | POA: Diagnosis not present

## 2022-10-10 DIAGNOSIS — I4892 Unspecified atrial flutter: Secondary | ICD-10-CM | POA: Diagnosis not present

## 2022-10-10 DIAGNOSIS — G20A1 Parkinson's disease without dyskinesia, without mention of fluctuations: Secondary | ICD-10-CM | POA: Diagnosis not present

## 2022-10-10 DIAGNOSIS — I509 Heart failure, unspecified: Secondary | ICD-10-CM | POA: Diagnosis not present

## 2022-10-10 DIAGNOSIS — N1832 Chronic kidney disease, stage 3b: Secondary | ICD-10-CM | POA: Diagnosis not present

## 2022-10-11 DIAGNOSIS — I130 Hypertensive heart and chronic kidney disease with heart failure and stage 1 through stage 4 chronic kidney disease, or unspecified chronic kidney disease: Secondary | ICD-10-CM | POA: Diagnosis not present

## 2022-10-11 DIAGNOSIS — I509 Heart failure, unspecified: Secondary | ICD-10-CM | POA: Diagnosis not present

## 2022-10-11 DIAGNOSIS — I4892 Unspecified atrial flutter: Secondary | ICD-10-CM | POA: Diagnosis not present

## 2022-10-11 DIAGNOSIS — N1832 Chronic kidney disease, stage 3b: Secondary | ICD-10-CM | POA: Diagnosis not present

## 2022-10-11 DIAGNOSIS — I4819 Other persistent atrial fibrillation: Secondary | ICD-10-CM | POA: Diagnosis not present

## 2022-10-11 DIAGNOSIS — G20A1 Parkinson's disease without dyskinesia, without mention of fluctuations: Secondary | ICD-10-CM | POA: Diagnosis not present

## 2022-10-12 DIAGNOSIS — N1832 Chronic kidney disease, stage 3b: Secondary | ICD-10-CM | POA: Diagnosis not present

## 2022-10-12 DIAGNOSIS — I509 Heart failure, unspecified: Secondary | ICD-10-CM | POA: Diagnosis not present

## 2022-10-12 DIAGNOSIS — I130 Hypertensive heart and chronic kidney disease with heart failure and stage 1 through stage 4 chronic kidney disease, or unspecified chronic kidney disease: Secondary | ICD-10-CM | POA: Diagnosis not present

## 2022-10-12 DIAGNOSIS — G20A1 Parkinson's disease without dyskinesia, without mention of fluctuations: Secondary | ICD-10-CM | POA: Diagnosis not present

## 2022-10-12 DIAGNOSIS — I4819 Other persistent atrial fibrillation: Secondary | ICD-10-CM | POA: Diagnosis not present

## 2022-10-12 DIAGNOSIS — I4892 Unspecified atrial flutter: Secondary | ICD-10-CM | POA: Diagnosis not present

## 2022-10-13 DIAGNOSIS — L97509 Non-pressure chronic ulcer of other part of unspecified foot with unspecified severity: Secondary | ICD-10-CM | POA: Diagnosis not present

## 2022-10-14 DIAGNOSIS — I130 Hypertensive heart and chronic kidney disease with heart failure and stage 1 through stage 4 chronic kidney disease, or unspecified chronic kidney disease: Secondary | ICD-10-CM | POA: Diagnosis not present

## 2022-10-14 DIAGNOSIS — G20A1 Parkinson's disease without dyskinesia, without mention of fluctuations: Secondary | ICD-10-CM | POA: Diagnosis not present

## 2022-10-14 DIAGNOSIS — N1832 Chronic kidney disease, stage 3b: Secondary | ICD-10-CM | POA: Diagnosis not present

## 2022-10-14 DIAGNOSIS — I4892 Unspecified atrial flutter: Secondary | ICD-10-CM | POA: Diagnosis not present

## 2022-10-14 DIAGNOSIS — I509 Heart failure, unspecified: Secondary | ICD-10-CM | POA: Diagnosis not present

## 2022-10-14 DIAGNOSIS — I4819 Other persistent atrial fibrillation: Secondary | ICD-10-CM | POA: Diagnosis not present

## 2022-10-17 DIAGNOSIS — I4819 Other persistent atrial fibrillation: Secondary | ICD-10-CM | POA: Diagnosis not present

## 2022-10-17 DIAGNOSIS — N1832 Chronic kidney disease, stage 3b: Secondary | ICD-10-CM | POA: Diagnosis not present

## 2022-10-17 DIAGNOSIS — I130 Hypertensive heart and chronic kidney disease with heart failure and stage 1 through stage 4 chronic kidney disease, or unspecified chronic kidney disease: Secondary | ICD-10-CM | POA: Diagnosis not present

## 2022-10-17 DIAGNOSIS — I509 Heart failure, unspecified: Secondary | ICD-10-CM | POA: Diagnosis not present

## 2022-10-17 DIAGNOSIS — I4892 Unspecified atrial flutter: Secondary | ICD-10-CM | POA: Diagnosis not present

## 2022-10-17 DIAGNOSIS — G20A1 Parkinson's disease without dyskinesia, without mention of fluctuations: Secondary | ICD-10-CM | POA: Diagnosis not present

## 2022-10-19 DIAGNOSIS — G20A1 Parkinson's disease without dyskinesia, without mention of fluctuations: Secondary | ICD-10-CM | POA: Diagnosis not present

## 2022-10-19 DIAGNOSIS — I130 Hypertensive heart and chronic kidney disease with heart failure and stage 1 through stage 4 chronic kidney disease, or unspecified chronic kidney disease: Secondary | ICD-10-CM | POA: Diagnosis not present

## 2022-10-19 DIAGNOSIS — N1832 Chronic kidney disease, stage 3b: Secondary | ICD-10-CM | POA: Diagnosis not present

## 2022-10-19 DIAGNOSIS — I4892 Unspecified atrial flutter: Secondary | ICD-10-CM | POA: Diagnosis not present

## 2022-10-19 DIAGNOSIS — I4819 Other persistent atrial fibrillation: Secondary | ICD-10-CM | POA: Diagnosis not present

## 2022-10-19 DIAGNOSIS — I509 Heart failure, unspecified: Secondary | ICD-10-CM | POA: Diagnosis not present

## 2022-10-21 ENCOUNTER — Telehealth: Payer: Self-pay | Admitting: Neurology

## 2022-10-21 NOTE — Telephone Encounter (Signed)
Tried calling [name] back to get more information no answer left a voice mail to call the office back

## 2022-10-21 NOTE — Telephone Encounter (Signed)
Pts friend came by the office and dropped off some records of the pt for Dr. Arbutus Leas to review and to place in the pts chart.  Friend states that the pt had been doing a lot of hallucination and would like to see if the medication could be changed.  Pt also got up out his recliner and was in a wheelchair that is folded up by the door and he put the hall lights on.  Friend would like to have a call back to discuss more.   **Placed the pw in the providers box for review.

## 2022-10-24 NOTE — Telephone Encounter (Signed)
Called patients partner [name] back

## 2022-10-25 DIAGNOSIS — Z85820 Personal history of malignant melanoma of skin: Secondary | ICD-10-CM | POA: Diagnosis not present

## 2022-10-25 DIAGNOSIS — Z86010 Personal history of colonic polyps: Secondary | ICD-10-CM | POA: Diagnosis not present

## 2022-10-25 DIAGNOSIS — Z7984 Long term (current) use of oral hypoglycemic drugs: Secondary | ICD-10-CM | POA: Diagnosis not present

## 2022-10-25 DIAGNOSIS — G20A1 Parkinson's disease without dyskinesia, without mention of fluctuations: Secondary | ICD-10-CM | POA: Diagnosis not present

## 2022-10-25 DIAGNOSIS — E782 Mixed hyperlipidemia: Secondary | ICD-10-CM | POA: Diagnosis not present

## 2022-10-25 DIAGNOSIS — I509 Heart failure, unspecified: Secondary | ICD-10-CM | POA: Diagnosis not present

## 2022-10-25 DIAGNOSIS — I4892 Unspecified atrial flutter: Secondary | ICD-10-CM | POA: Diagnosis not present

## 2022-10-25 DIAGNOSIS — I272 Pulmonary hypertension, unspecified: Secondary | ICD-10-CM | POA: Diagnosis not present

## 2022-10-25 DIAGNOSIS — Z8711 Personal history of peptic ulcer disease: Secondary | ICD-10-CM | POA: Diagnosis not present

## 2022-10-25 DIAGNOSIS — Z556 Problems related to health literacy: Secondary | ICD-10-CM | POA: Diagnosis not present

## 2022-10-25 DIAGNOSIS — Z604 Social exclusion and rejection: Secondary | ICD-10-CM | POA: Diagnosis not present

## 2022-10-25 DIAGNOSIS — I2510 Atherosclerotic heart disease of native coronary artery without angina pectoris: Secondary | ICD-10-CM | POA: Diagnosis not present

## 2022-10-25 DIAGNOSIS — Z7901 Long term (current) use of anticoagulants: Secondary | ICD-10-CM | POA: Diagnosis not present

## 2022-10-25 DIAGNOSIS — N529 Male erectile dysfunction, unspecified: Secondary | ICD-10-CM | POA: Diagnosis not present

## 2022-10-25 DIAGNOSIS — Z955 Presence of coronary angioplasty implant and graft: Secondary | ICD-10-CM | POA: Diagnosis not present

## 2022-10-25 DIAGNOSIS — I080 Rheumatic disorders of both mitral and aortic valves: Secondary | ICD-10-CM | POA: Diagnosis not present

## 2022-10-25 DIAGNOSIS — I130 Hypertensive heart and chronic kidney disease with heart failure and stage 1 through stage 4 chronic kidney disease, or unspecified chronic kidney disease: Secondary | ICD-10-CM | POA: Diagnosis not present

## 2022-10-25 DIAGNOSIS — I4819 Other persistent atrial fibrillation: Secondary | ICD-10-CM | POA: Diagnosis not present

## 2022-10-25 DIAGNOSIS — G629 Polyneuropathy, unspecified: Secondary | ICD-10-CM | POA: Diagnosis not present

## 2022-10-25 DIAGNOSIS — N1832 Chronic kidney disease, stage 3b: Secondary | ICD-10-CM | POA: Diagnosis not present

## 2022-10-25 NOTE — Telephone Encounter (Signed)
Called Pt and reported the papers we have are not what Dr. Arbutus Leas is in need of. [name] is going to call DR. Merri Brunette to  get resent labs on this pt. I gave her the Clinic fax number.

## 2022-10-26 ENCOUNTER — Telehealth: Payer: Self-pay | Admitting: Neurology

## 2022-10-26 DIAGNOSIS — I4819 Other persistent atrial fibrillation: Secondary | ICD-10-CM | POA: Diagnosis not present

## 2022-10-26 DIAGNOSIS — G20A1 Parkinson's disease without dyskinesia, without mention of fluctuations: Secondary | ICD-10-CM | POA: Diagnosis not present

## 2022-10-26 DIAGNOSIS — I130 Hypertensive heart and chronic kidney disease with heart failure and stage 1 through stage 4 chronic kidney disease, or unspecified chronic kidney disease: Secondary | ICD-10-CM | POA: Diagnosis not present

## 2022-10-26 DIAGNOSIS — I4892 Unspecified atrial flutter: Secondary | ICD-10-CM | POA: Diagnosis not present

## 2022-10-26 DIAGNOSIS — I509 Heart failure, unspecified: Secondary | ICD-10-CM | POA: Diagnosis not present

## 2022-10-26 DIAGNOSIS — N1832 Chronic kidney disease, stage 3b: Secondary | ICD-10-CM | POA: Diagnosis not present

## 2022-10-26 NOTE — Telephone Encounter (Signed)
UA negative.  QTc was 518 on last EKG.  That is a bit long for Nuplazid.  Let me ask cardiology about that.

## 2022-10-26 NOTE — Telephone Encounter (Signed)
[name]  called stating that she has the results, the nurse is looking for

## 2022-10-28 DIAGNOSIS — I509 Heart failure, unspecified: Secondary | ICD-10-CM | POA: Diagnosis not present

## 2022-10-28 DIAGNOSIS — N1832 Chronic kidney disease, stage 3b: Secondary | ICD-10-CM | POA: Diagnosis not present

## 2022-10-28 DIAGNOSIS — I130 Hypertensive heart and chronic kidney disease with heart failure and stage 1 through stage 4 chronic kidney disease, or unspecified chronic kidney disease: Secondary | ICD-10-CM | POA: Diagnosis not present

## 2022-10-28 DIAGNOSIS — I4892 Unspecified atrial flutter: Secondary | ICD-10-CM | POA: Diagnosis not present

## 2022-10-28 DIAGNOSIS — G20A1 Parkinson's disease without dyskinesia, without mention of fluctuations: Secondary | ICD-10-CM | POA: Diagnosis not present

## 2022-10-28 DIAGNOSIS — I4819 Other persistent atrial fibrillation: Secondary | ICD-10-CM | POA: Diagnosis not present

## 2022-10-28 NOTE — Telephone Encounter (Signed)
Called linda and told her that we are waiting on the Cardiology and they should be calling soon

## 2022-10-28 NOTE — Telephone Encounter (Signed)
Patients partner called and left voicemail asking if we can move forward with the Nuplazid. I have not called her back to let her know we are know waiting on cardiology

## 2022-11-01 DIAGNOSIS — G20A1 Parkinson's disease without dyskinesia, without mention of fluctuations: Secondary | ICD-10-CM | POA: Diagnosis not present

## 2022-11-01 DIAGNOSIS — N1832 Chronic kidney disease, stage 3b: Secondary | ICD-10-CM | POA: Diagnosis not present

## 2022-11-01 DIAGNOSIS — I4892 Unspecified atrial flutter: Secondary | ICD-10-CM | POA: Diagnosis not present

## 2022-11-01 DIAGNOSIS — I130 Hypertensive heart and chronic kidney disease with heart failure and stage 1 through stage 4 chronic kidney disease, or unspecified chronic kidney disease: Secondary | ICD-10-CM | POA: Diagnosis not present

## 2022-11-01 DIAGNOSIS — I509 Heart failure, unspecified: Secondary | ICD-10-CM | POA: Diagnosis not present

## 2022-11-01 DIAGNOSIS — I4819 Other persistent atrial fibrillation: Secondary | ICD-10-CM | POA: Diagnosis not present

## 2022-11-02 NOTE — Telephone Encounter (Signed)
Spoke to his cardiologist who said "I think his Qtc is inherently long due to underlying bundle branch block. We can be lenient with QTC in that situation. Okay to use."  Chelsea, give samples of nuplazid 34 mg, 1 capsule daily and also have them sign and send in RX to the specialty pharmacy

## 2022-11-02 NOTE — Telephone Encounter (Signed)
Called patients friend and she is coming to pick up samples

## 2022-11-03 DIAGNOSIS — N1832 Chronic kidney disease, stage 3b: Secondary | ICD-10-CM | POA: Diagnosis not present

## 2022-11-03 DIAGNOSIS — I4892 Unspecified atrial flutter: Secondary | ICD-10-CM | POA: Diagnosis not present

## 2022-11-03 DIAGNOSIS — I4819 Other persistent atrial fibrillation: Secondary | ICD-10-CM | POA: Diagnosis not present

## 2022-11-03 DIAGNOSIS — I130 Hypertensive heart and chronic kidney disease with heart failure and stage 1 through stage 4 chronic kidney disease, or unspecified chronic kidney disease: Secondary | ICD-10-CM | POA: Diagnosis not present

## 2022-11-03 DIAGNOSIS — G20A1 Parkinson's disease without dyskinesia, without mention of fluctuations: Secondary | ICD-10-CM | POA: Diagnosis not present

## 2022-11-03 DIAGNOSIS — I509 Heart failure, unspecified: Secondary | ICD-10-CM | POA: Diagnosis not present

## 2022-11-04 DIAGNOSIS — Z23 Encounter for immunization: Secondary | ICD-10-CM | POA: Diagnosis not present

## 2022-11-07 ENCOUNTER — Telehealth: Payer: Self-pay | Admitting: Neurology

## 2022-11-07 DIAGNOSIS — I4819 Other persistent atrial fibrillation: Secondary | ICD-10-CM | POA: Diagnosis not present

## 2022-11-07 DIAGNOSIS — G20A1 Parkinson's disease without dyskinesia, without mention of fluctuations: Secondary | ICD-10-CM | POA: Diagnosis not present

## 2022-11-07 DIAGNOSIS — I509 Heart failure, unspecified: Secondary | ICD-10-CM | POA: Diagnosis not present

## 2022-11-07 DIAGNOSIS — N1832 Chronic kidney disease, stage 3b: Secondary | ICD-10-CM | POA: Diagnosis not present

## 2022-11-07 DIAGNOSIS — I4892 Unspecified atrial flutter: Secondary | ICD-10-CM | POA: Diagnosis not present

## 2022-11-07 DIAGNOSIS — I130 Hypertensive heart and chronic kidney disease with heart failure and stage 1 through stage 4 chronic kidney disease, or unspecified chronic kidney disease: Secondary | ICD-10-CM | POA: Diagnosis not present

## 2022-11-07 NOTE — Telephone Encounter (Signed)
Dr .Arbutus Leas reported that it takes 6 weeks for medication to begin working . I called [name] and he is talking about deceased family members and hallucinating on. She goggled the effects of Nuplazid and it causes some of these side effects. I did let her know we have 100s of patients on this medication. Patient not  sleeping and the hallucinations are severe

## 2022-11-07 NOTE — Telephone Encounter (Signed)
Patients friend linda west called to speak with chelsea. She said this is an urgent need. The new medicine Tat put him on has made him 10x worse. Nuplazid. He has taken this medicine 5x.

## 2022-11-08 NOTE — Telephone Encounter (Signed)
Patient going into [facility] for rehab. He had fallen and used the bathroom on himself [name] is unable to continue caring for him by herself. She believes the Nuplazid caused the decline and will be taking patient of of this medication

## 2022-11-09 ENCOUNTER — Inpatient Hospital Stay (HOSPITAL_COMMUNITY): Payer: Medicare Other

## 2022-11-09 ENCOUNTER — Encounter (HOSPITAL_COMMUNITY): Payer: Self-pay | Admitting: Emergency Medicine

## 2022-11-09 ENCOUNTER — Other Ambulatory Visit: Payer: Self-pay

## 2022-11-09 ENCOUNTER — Emergency Department (HOSPITAL_COMMUNITY): Payer: Medicare Other

## 2022-11-09 ENCOUNTER — Inpatient Hospital Stay (HOSPITAL_COMMUNITY)
Admission: EM | Admit: 2022-11-09 | Discharge: 2022-11-29 | DRG: 871 | Disposition: E | Payer: Medicare Other | Attending: Internal Medicine | Admitting: Internal Medicine

## 2022-11-09 DIAGNOSIS — R680 Hypothermia, not associated with low environmental temperature: Secondary | ICD-10-CM | POA: Diagnosis present

## 2022-11-09 DIAGNOSIS — Z1152 Encounter for screening for COVID-19: Secondary | ICD-10-CM | POA: Diagnosis not present

## 2022-11-09 DIAGNOSIS — I2510 Atherosclerotic heart disease of native coronary artery without angina pectoris: Secondary | ICD-10-CM | POA: Diagnosis present

## 2022-11-09 DIAGNOSIS — J189 Pneumonia, unspecified organism: Secondary | ICD-10-CM | POA: Diagnosis not present

## 2022-11-09 DIAGNOSIS — R442 Other hallucinations: Secondary | ICD-10-CM | POA: Diagnosis not present

## 2022-11-09 DIAGNOSIS — T68XXXA Hypothermia, initial encounter: Secondary | ICD-10-CM

## 2022-11-09 DIAGNOSIS — A419 Sepsis, unspecified organism: Principal | ICD-10-CM | POA: Diagnosis present

## 2022-11-09 DIAGNOSIS — R1310 Dysphagia, unspecified: Secondary | ICD-10-CM | POA: Diagnosis present

## 2022-11-09 DIAGNOSIS — N179 Acute kidney failure, unspecified: Secondary | ICD-10-CM | POA: Diagnosis present

## 2022-11-09 DIAGNOSIS — N1832 Chronic kidney disease, stage 3b: Secondary | ICD-10-CM | POA: Diagnosis not present

## 2022-11-09 DIAGNOSIS — I5033 Acute on chronic diastolic (congestive) heart failure: Secondary | ICD-10-CM | POA: Diagnosis not present

## 2022-11-09 DIAGNOSIS — I130 Hypertensive heart and chronic kidney disease with heart failure and stage 1 through stage 4 chronic kidney disease, or unspecified chronic kidney disease: Secondary | ICD-10-CM | POA: Diagnosis not present

## 2022-11-09 DIAGNOSIS — S199XXA Unspecified injury of neck, initial encounter: Secondary | ICD-10-CM | POA: Diagnosis not present

## 2022-11-09 DIAGNOSIS — E43 Unspecified severe protein-calorie malnutrition: Secondary | ICD-10-CM | POA: Diagnosis present

## 2022-11-09 DIAGNOSIS — F0282 Dementia in other diseases classified elsewhere, unspecified severity, with psychotic disturbance: Secondary | ICD-10-CM | POA: Diagnosis present

## 2022-11-09 DIAGNOSIS — R531 Weakness: Secondary | ICD-10-CM | POA: Diagnosis not present

## 2022-11-09 DIAGNOSIS — I272 Pulmonary hypertension, unspecified: Secondary | ICD-10-CM | POA: Diagnosis present

## 2022-11-09 DIAGNOSIS — I700 Atherosclerosis of aorta: Secondary | ICD-10-CM | POA: Diagnosis not present

## 2022-11-09 DIAGNOSIS — J90 Pleural effusion, not elsewhere classified: Secondary | ICD-10-CM | POA: Diagnosis not present

## 2022-11-09 DIAGNOSIS — I480 Paroxysmal atrial fibrillation: Secondary | ICD-10-CM | POA: Diagnosis present

## 2022-11-09 DIAGNOSIS — I080 Rheumatic disorders of both mitral and aortic valves: Secondary | ICD-10-CM | POA: Diagnosis present

## 2022-11-09 DIAGNOSIS — D649 Anemia, unspecified: Secondary | ICD-10-CM | POA: Diagnosis present

## 2022-11-09 DIAGNOSIS — I10 Essential (primary) hypertension: Secondary | ICD-10-CM | POA: Diagnosis present

## 2022-11-09 DIAGNOSIS — Z4682 Encounter for fitting and adjustment of non-vascular catheter: Secondary | ICD-10-CM | POA: Diagnosis not present

## 2022-11-09 DIAGNOSIS — R627 Adult failure to thrive: Secondary | ICD-10-CM | POA: Diagnosis present

## 2022-11-09 DIAGNOSIS — Z7189 Other specified counseling: Secondary | ICD-10-CM | POA: Diagnosis not present

## 2022-11-09 DIAGNOSIS — I959 Hypotension, unspecified: Secondary | ICD-10-CM | POA: Diagnosis present

## 2022-11-09 DIAGNOSIS — R410 Disorientation, unspecified: Secondary | ICD-10-CM | POA: Diagnosis not present

## 2022-11-09 DIAGNOSIS — R0989 Other specified symptoms and signs involving the circulatory and respiratory systems: Secondary | ICD-10-CM | POA: Diagnosis not present

## 2022-11-09 DIAGNOSIS — I517 Cardiomegaly: Secondary | ICD-10-CM | POA: Diagnosis not present

## 2022-11-09 DIAGNOSIS — J690 Pneumonitis due to inhalation of food and vomit: Secondary | ICD-10-CM | POA: Diagnosis present

## 2022-11-09 DIAGNOSIS — E873 Alkalosis: Secondary | ICD-10-CM | POA: Diagnosis not present

## 2022-11-09 DIAGNOSIS — R569 Unspecified convulsions: Secondary | ICD-10-CM | POA: Diagnosis not present

## 2022-11-09 DIAGNOSIS — I4510 Unspecified right bundle-branch block: Secondary | ICD-10-CM | POA: Diagnosis present

## 2022-11-09 DIAGNOSIS — E861 Hypovolemia: Secondary | ICD-10-CM | POA: Diagnosis present

## 2022-11-09 DIAGNOSIS — I4891 Unspecified atrial fibrillation: Secondary | ICD-10-CM | POA: Diagnosis present

## 2022-11-09 DIAGNOSIS — E8770 Fluid overload, unspecified: Secondary | ICD-10-CM

## 2022-11-09 DIAGNOSIS — J9811 Atelectasis: Secondary | ICD-10-CM | POA: Diagnosis not present

## 2022-11-09 DIAGNOSIS — D696 Thrombocytopenia, unspecified: Secondary | ICD-10-CM | POA: Diagnosis not present

## 2022-11-09 DIAGNOSIS — Z79899 Other long term (current) drug therapy: Secondary | ICD-10-CM

## 2022-11-09 DIAGNOSIS — G20A1 Parkinson's disease without dyskinesia, without mention of fluctuations: Secondary | ICD-10-CM | POA: Diagnosis present

## 2022-11-09 DIAGNOSIS — E871 Hypo-osmolality and hyponatremia: Secondary | ICD-10-CM | POA: Diagnosis not present

## 2022-11-09 DIAGNOSIS — S0990XA Unspecified injury of head, initial encounter: Secondary | ICD-10-CM | POA: Diagnosis not present

## 2022-11-09 DIAGNOSIS — Z87442 Personal history of urinary calculi: Secondary | ICD-10-CM

## 2022-11-09 DIAGNOSIS — R4182 Altered mental status, unspecified: Secondary | ICD-10-CM | POA: Diagnosis present

## 2022-11-09 DIAGNOSIS — E870 Hyperosmolality and hypernatremia: Secondary | ICD-10-CM | POA: Diagnosis present

## 2022-11-09 DIAGNOSIS — G9341 Metabolic encephalopathy: Secondary | ICD-10-CM | POA: Diagnosis present

## 2022-11-09 DIAGNOSIS — R0902 Hypoxemia: Secondary | ICD-10-CM | POA: Diagnosis not present

## 2022-11-09 DIAGNOSIS — J9601 Acute respiratory failure with hypoxia: Secondary | ICD-10-CM | POA: Diagnosis present

## 2022-11-09 DIAGNOSIS — N183 Chronic kidney disease, stage 3 unspecified: Secondary | ICD-10-CM | POA: Diagnosis present

## 2022-11-09 DIAGNOSIS — Z85820 Personal history of malignant melanoma of skin: Secondary | ICD-10-CM

## 2022-11-09 DIAGNOSIS — J811 Chronic pulmonary edema: Secondary | ICD-10-CM | POA: Diagnosis not present

## 2022-11-09 DIAGNOSIS — E7800 Pure hypercholesterolemia, unspecified: Secondary | ICD-10-CM | POA: Diagnosis present

## 2022-11-09 DIAGNOSIS — N390 Urinary tract infection, site not specified: Secondary | ICD-10-CM | POA: Diagnosis present

## 2022-11-09 DIAGNOSIS — J9602 Acute respiratory failure with hypercapnia: Secondary | ICD-10-CM | POA: Diagnosis not present

## 2022-11-09 DIAGNOSIS — M159 Polyosteoarthritis, unspecified: Secondary | ICD-10-CM | POA: Diagnosis present

## 2022-11-09 DIAGNOSIS — I2722 Pulmonary hypertension due to left heart disease: Secondary | ICD-10-CM | POA: Diagnosis present

## 2022-11-09 DIAGNOSIS — R9389 Abnormal findings on diagnostic imaging of other specified body structures: Secondary | ICD-10-CM | POA: Diagnosis not present

## 2022-11-09 DIAGNOSIS — I420 Dilated cardiomyopathy: Secondary | ICD-10-CM | POA: Diagnosis present

## 2022-11-09 DIAGNOSIS — Z993 Dependence on wheelchair: Secondary | ICD-10-CM

## 2022-11-09 DIAGNOSIS — I129 Hypertensive chronic kidney disease with stage 1 through stage 4 chronic kidney disease, or unspecified chronic kidney disease: Secondary | ICD-10-CM | POA: Diagnosis not present

## 2022-11-09 DIAGNOSIS — Z7901 Long term (current) use of anticoagulants: Secondary | ICD-10-CM

## 2022-11-09 DIAGNOSIS — E876 Hypokalemia: Secondary | ICD-10-CM | POA: Diagnosis not present

## 2022-11-09 DIAGNOSIS — M7989 Other specified soft tissue disorders: Secondary | ICD-10-CM | POA: Diagnosis present

## 2022-11-09 DIAGNOSIS — R0603 Acute respiratory distress: Secondary | ICD-10-CM | POA: Diagnosis not present

## 2022-11-09 DIAGNOSIS — Z955 Presence of coronary angioplasty implant and graft: Secondary | ICD-10-CM

## 2022-11-09 DIAGNOSIS — R918 Other nonspecific abnormal finding of lung field: Secondary | ICD-10-CM | POA: Diagnosis not present

## 2022-11-09 DIAGNOSIS — I890 Lymphedema, not elsewhere classified: Secondary | ICD-10-CM | POA: Diagnosis present

## 2022-11-09 DIAGNOSIS — E860 Dehydration: Secondary | ICD-10-CM | POA: Diagnosis present

## 2022-11-09 DIAGNOSIS — Z515 Encounter for palliative care: Secondary | ICD-10-CM | POA: Diagnosis not present

## 2022-11-09 DIAGNOSIS — Z8546 Personal history of malignant neoplasm of prostate: Secondary | ICD-10-CM

## 2022-11-09 DIAGNOSIS — Z6827 Body mass index (BMI) 27.0-27.9, adult: Secondary | ICD-10-CM

## 2022-11-09 DIAGNOSIS — J984 Other disorders of lung: Secondary | ICD-10-CM | POA: Diagnosis not present

## 2022-11-09 DIAGNOSIS — Z66 Do not resuscitate: Secondary | ICD-10-CM | POA: Diagnosis not present

## 2022-11-09 DIAGNOSIS — M50222 Other cervical disc displacement at C5-C6 level: Secondary | ICD-10-CM | POA: Diagnosis not present

## 2022-11-09 DIAGNOSIS — M4312 Spondylolisthesis, cervical region: Secondary | ICD-10-CM | POA: Diagnosis present

## 2022-11-09 DIAGNOSIS — I509 Heart failure, unspecified: Secondary | ICD-10-CM

## 2022-11-09 DIAGNOSIS — Z8249 Family history of ischemic heart disease and other diseases of the circulatory system: Secondary | ICD-10-CM

## 2022-11-09 DIAGNOSIS — R0602 Shortness of breath: Secondary | ICD-10-CM | POA: Diagnosis not present

## 2022-11-09 DIAGNOSIS — R6520 Severe sepsis without septic shock: Secondary | ICD-10-CM | POA: Diagnosis present

## 2022-11-09 DIAGNOSIS — G9340 Encephalopathy, unspecified: Secondary | ICD-10-CM

## 2022-11-09 LAB — CBC WITH DIFFERENTIAL/PLATELET
Abs Immature Granulocytes: 0.01 10*3/uL (ref 0.00–0.07)
Basophils Absolute: 0 10*3/uL (ref 0.0–0.1)
Basophils Relative: 1 %
Eosinophils Absolute: 0.1 10*3/uL (ref 0.0–0.5)
Eosinophils Relative: 2 %
HCT: 40.9 % (ref 39.0–52.0)
Hemoglobin: 12.2 g/dL — ABNORMAL LOW (ref 13.0–17.0)
Immature Granulocytes: 0 %
Lymphocytes Relative: 8 %
Lymphs Abs: 0.3 10*3/uL — ABNORMAL LOW (ref 0.7–4.0)
MCH: 27.7 pg (ref 26.0–34.0)
MCHC: 29.8 g/dL — ABNORMAL LOW (ref 30.0–36.0)
MCV: 93 fL (ref 80.0–100.0)
Monocytes Absolute: 0.3 10*3/uL (ref 0.1–1.0)
Monocytes Relative: 7 %
Neutro Abs: 3.6 10*3/uL (ref 1.7–7.7)
Neutrophils Relative %: 82 %
Platelets: 109 10*3/uL — ABNORMAL LOW (ref 150–400)
RBC: 4.4 MIL/uL (ref 4.22–5.81)
RDW: 18.2 % — ABNORMAL HIGH (ref 11.5–15.5)
WBC: 4.3 10*3/uL (ref 4.0–10.5)
nRBC: 0 % (ref 0.0–0.2)

## 2022-11-09 LAB — URINALYSIS, W/ REFLEX TO CULTURE (INFECTION SUSPECTED)
Bilirubin Urine: NEGATIVE
Glucose, UA: 150 mg/dL — AB
Hgb urine dipstick: NEGATIVE
Ketones, ur: NEGATIVE mg/dL
Leukocytes,Ua: NEGATIVE
Nitrite: NEGATIVE
Protein, ur: NEGATIVE mg/dL
Specific Gravity, Urine: 1.011 (ref 1.005–1.030)
pH: 6 (ref 5.0–8.0)

## 2022-11-09 LAB — COMPREHENSIVE METABOLIC PANEL
ALT: 7 U/L (ref 0–44)
AST: 25 U/L (ref 15–41)
Albumin: 3.3 g/dL — ABNORMAL LOW (ref 3.5–5.0)
Alkaline Phosphatase: 144 U/L — ABNORMAL HIGH (ref 38–126)
Anion gap: 14 (ref 5–15)
BUN: 54 mg/dL — ABNORMAL HIGH (ref 8–23)
CO2: 31 mmol/L (ref 22–32)
Calcium: 9.5 mg/dL (ref 8.9–10.3)
Chloride: 97 mmol/L — ABNORMAL LOW (ref 98–111)
Creatinine, Ser: 2.52 mg/dL — ABNORMAL HIGH (ref 0.61–1.24)
GFR, Estimated: 24 mL/min — ABNORMAL LOW (ref >60)
Glucose, Bld: 80 mg/dL (ref 70–99)
Potassium: 4.6 mmol/L (ref 3.5–5.1)
Sodium: 142 mmol/L (ref 135–145)
Total Bilirubin: 1.7 mg/dL — ABNORMAL HIGH (ref 0.3–1.2)
Total Protein: 6.8 g/dL (ref 6.5–8.1)

## 2022-11-09 LAB — CBG MONITORING, ED: Glucose-Capillary: 84 mg/dL (ref 70–99)

## 2022-11-09 LAB — RESP PANEL BY RT-PCR (RSV, FLU A&B, COVID)  RVPGX2
Influenza A by PCR: NEGATIVE
Influenza B by PCR: NEGATIVE
Resp Syncytial Virus by PCR: NEGATIVE
SARS Coronavirus 2 by RT PCR: NEGATIVE

## 2022-11-09 LAB — I-STAT CHEM 8, ED
BUN: 57 mg/dL — ABNORMAL HIGH (ref 8–23)
Calcium, Ion: 1.06 mmol/L — ABNORMAL LOW (ref 1.15–1.40)
Chloride: 99 mmol/L (ref 98–111)
Creatinine, Ser: 2.6 mg/dL — ABNORMAL HIGH (ref 0.61–1.24)
Glucose, Bld: 81 mg/dL (ref 70–99)
HCT: 40 % (ref 39.0–52.0)
Hemoglobin: 13.6 g/dL (ref 13.0–17.0)
Potassium: 4.5 mmol/L (ref 3.5–5.1)
Sodium: 142 mmol/L (ref 135–145)
TCO2: 35 mmol/L — ABNORMAL HIGH (ref 22–32)

## 2022-11-09 LAB — PROTIME-INR
INR: 1.5 — ABNORMAL HIGH (ref 0.8–1.2)
Prothrombin Time: 18.7 s — ABNORMAL HIGH (ref 11.4–15.2)

## 2022-11-09 LAB — I-STAT CG4 LACTIC ACID, ED
Lactic Acid, Venous: 1.2 mmol/L (ref 0.5–1.9)
Lactic Acid, Venous: 1.4 mmol/L (ref 0.5–1.9)

## 2022-11-09 LAB — BRAIN NATRIURETIC PEPTIDE: B Natriuretic Peptide: 225.9 pg/mL — ABNORMAL HIGH (ref 0.0–100.0)

## 2022-11-09 LAB — TROPONIN I (HIGH SENSITIVITY)
Troponin I (High Sensitivity): 22 ng/L — ABNORMAL HIGH (ref <18)
Troponin I (High Sensitivity): 24 ng/L — ABNORMAL HIGH (ref <18)

## 2022-11-09 LAB — CK: Total CK: 82 U/L (ref 49–397)

## 2022-11-09 LAB — PROCALCITONIN: Procalcitonin: <0.1 ng/mL

## 2022-11-09 LAB — APTT: aPTT: 56 s — ABNORMAL HIGH (ref 24–36)

## 2022-11-09 MED ORDER — ACETAMINOPHEN 325 MG PO TABS
650.0000 mg | ORAL_TABLET | Freq: Four times a day (QID) | ORAL | Status: DC | PRN
  Administered 2022-11-12: 650 mg via ORAL
  Filled 2022-11-09: qty 2

## 2022-11-09 MED ORDER — METRONIDAZOLE 500 MG/100ML IV SOLN: 500.0000 mg | Freq: Two times a day (BID) | INTRAVENOUS | Status: DC

## 2022-11-09 MED ORDER — ONDANSETRON HCL 4 MG PO TABS: 4.0000 mg | ORAL_TABLET | Freq: Four times a day (QID) | ORAL | Status: DC | PRN

## 2022-11-09 MED ORDER — LACTATED RINGERS IV SOLN: INTRAVENOUS | Status: DC

## 2022-11-09 MED ORDER — METRONIDAZOLE 500 MG/100ML IV SOLN
500.0000 mg | Freq: Once | INTRAVENOUS | Status: AC
  Administered 2022-11-09: 500 mg via INTRAVENOUS
  Filled 2022-11-09: qty 100

## 2022-11-09 MED ORDER — VANCOMYCIN HCL 1500 MG/300ML IV SOLN
1500.0000 mg | Freq: Once | INTRAVENOUS | Status: AC
  Administered 2022-11-09: 1500 mg via INTRAVENOUS
  Filled 2022-11-09: qty 300

## 2022-11-09 MED ORDER — APIXABAN 2.5 MG PO TABS
2.5000 mg | ORAL_TABLET | Freq: Two times a day (BID) | ORAL | Status: DC
  Administered 2022-11-11 – 2022-11-16 (×9): 2.5 mg via ORAL
  Filled 2022-11-09 (×12): qty 1

## 2022-11-09 MED ORDER — IPRATROPIUM-ALBUTEROL 0.5-2.5 (3) MG/3ML IN SOLN
RESPIRATORY_TRACT | Status: AC
  Administered 2022-11-09: 3 mL via RESPIRATORY_TRACT
  Filled 2022-11-09: qty 3

## 2022-11-09 MED ORDER — SODIUM CHLORIDE 0.9 % IV SOLN
2.0000 g | Freq: Once | INTRAVENOUS | Status: AC
  Administered 2022-11-09: 2 g via INTRAVENOUS
  Filled 2022-11-09: qty 12.5

## 2022-11-09 MED ORDER — CARBIDOPA-LEVODOPA 25-100 MG PO TABS
2.0000 | ORAL_TABLET | Freq: Three times a day (TID) | ORAL | Status: DC
  Administered 2022-11-11 – 2022-11-16 (×13): 2 via ORAL
  Filled 2022-11-09 (×16): qty 2

## 2022-11-09 MED ORDER — SODIUM CHLORIDE 0.9 % IV SOLN: 2.0000 g | INTRAVENOUS | Status: DC

## 2022-11-09 MED ORDER — IPRATROPIUM-ALBUTEROL 0.5-2.5 (3) MG/3ML IN SOLN
3.0000 mL | RESPIRATORY_TRACT | Status: DC
  Administered 2022-11-10 – 2022-11-11 (×8): 3 mL via RESPIRATORY_TRACT
  Filled 2022-11-09 (×8): qty 3

## 2022-11-09 MED ORDER — ONDANSETRON HCL 4 MG/2ML IJ SOLN: 4.0000 mg | Freq: Four times a day (QID) | INTRAMUSCULAR | Status: DC | PRN

## 2022-11-09 MED ORDER — VANCOMYCIN HCL IN DEXTROSE 1-5 GM/200ML-% IV SOLN: 1000.0000 mg | INTRAVENOUS | Status: DC

## 2022-11-09 MED ORDER — VANCOMYCIN HCL IN DEXTROSE 1-5 GM/200ML-% IV SOLN: 1000.0000 mg | Freq: Once | INTRAVENOUS | Status: DC

## 2022-11-09 MED ORDER — ACETAMINOPHEN 650 MG RE SUPP: 650.0000 mg | Freq: Four times a day (QID) | RECTAL | Status: DC | PRN

## 2022-11-09 MED ORDER — METRONIDAZOLE 500 MG/100ML IV SOLN
500.0000 mg | Freq: Two times a day (BID) | INTRAVENOUS | Status: DC
  Administered 2022-11-09: 500 mg via INTRAVENOUS
  Filled 2022-11-09: qty 100

## 2022-11-09 NOTE — Progress Notes (Signed)
01-Dec-2022 2325  BiPAP/CPAP/SIPAP  $ Non-Invasive Ventilator  Non-Invasive Vent Initial  $ Face Mask Large  Yes  BiPAP/CPAP/SIPAP Pt Type Adult  BiPAP/CPAP/SIPAP V60  Mask Type Full face mask  Mask Size Large  Set Rate 14 breaths/min  Respiratory Rate 20 breaths/min  IPAP 16 cmH20  EPAP 6 cmH2O  FiO2 (%) 40 %  Minute Ventilation 9.8  Leak 55  Peak Inspiratory Pressure (PIP) 16  Tidal Volume (Vt) 425  Patient Home Equipment No  Auto Titrate No  Press High Alarm 25 cmH2O  Press Low Alarm 5 cmH2O  BiPAP/CPAP /SiPAP Vitals  Pulse Rate 91  Resp 20  SpO2 98 %  Bilateral Breath Sounds Diminished;Expiratory wheezes  MEWS Score/Color  MEWS Score 0  MEWS Score Color [name]

## 2022-11-09 NOTE — Progress Notes (Signed)
ED Pharmacy Antibiotic Sign Off An antibiotic consult was received from an ED provider for vancomycin and cefepime per pharmacy dosing for sepsis. A chart review was completed to assess appropriateness.   The following one time order(s) were placed:  Vancomycin 1500mg  IV x 1 Cefepime 2g IV x 1 Flagyl 500mg  IV x 1  Further antibiotic and/or antibiotic pharmacy consults should be ordered by the admitting provider if indicated.   Thank you for allowing pharmacy to be a part of this patient's care.   Almee Pelphrey, Drake Leach, [facility]  Clinical Pharmacist 11/08/2022 12:47 PM

## 2022-11-09 NOTE — ED Provider Notes (Signed)
[place] EMERGENCY DEPARTMENT AT [facility] Provider Note   CSN: [id number] Arrival date & time: 11/13/2022  1220     History {Add pertinent medical, surgical, social history, OB history to HPI:1} Chief Complaint  Patient presents with   Failure To Thrive    Jhadiel Eggett is a 87 y.o. male with an extensive past medical history including Parkinson's, pulmonary hypertension, dilated cardiomyopathy, CAD, chronic kidney disease who presents via EMS for altered mentation.[name]  History gathered at bedside from the nurses who took triage.  They report that family called EMS for increased altered mentation over the past week or more.  He has been apparently only sitting in his recliner.  He was found by EMS naked covered in urine and stool.  Family reports increased confusion and hallucinations.  HPI     Home Medications Prior to Admission medications   Medication Sig Start Date End Date Taking? Authorizing Provider  acetaminophen (TYLENOL) 325 MG tablet Take 1-2 tablets (325-650 mg total) by mouth every 6 (six) hours as needed for mild pain (pain score 1-3 or temp > 100.5). 05/21/20   Darrick Grinder, PA-C  apixaban (ELIQUIS) 2.5 MG TABS tablet Take 1 tablet (2.5 mg total) by mouth 2 (two) times daily. (0800 & 2000) Patient taking differently: Take 2.5 mg by mouth 2 (two) times daily. 01/26/21   Patwardhan, Manish J, MD  carbidopa-levodopa (SINEMET IR) 25-100 MG tablet TAKE 2 TABLETS THREE TIMES DAILY AT 7:00AM, 11:00AM AND 4:00PM CAN TAKE EXTRA 1/2 TO 1 TABLET AS NEEDED 09/09/22   Tat, Octaviano Batty, DO  Cholecalciferol (VITAMIN D-3) 1000 units CAPS Take 1,000 Units by mouth daily. (0800)    [provider]  dapagliflozin propanediol (FARXIGA) 10 MG TABS tablet Take 1 tablet by mouth daily.    [provider]  diltiazem (CARDIZEM CD) 120 MG 24 hr capsule TAKE 1 CAPSULE EVERY DAY Patient taking differently: Take 120 mg by mouth daily. 01/31/22   Patwardhan,  Anabel Bene, MD  doxycycline (VIBRAMYCIN) 100 MG capsule Take 100 mg by mouth 2 (two) times daily. 07/04/22   [provider]  furosemide (LASIX) 40 MG tablet Take 1 tablet (40 mg total) by mouth 2 (two) times daily. 07/08/22 11/05/22  Patwardhan, Anabel Bene, MD  gabapentin (NEURONTIN) 300 MG capsule Take 1 capsule (300 mg total) by mouth 3 (three) times daily. Patient taking differently: Take 300 mg by mouth daily. 05/05/20   Tat, Octaviano Batty, DO  ketoconazole (NIZORAL) 2 % cream Apply 1 Application topically daily. To both heels 08/08/22   McDonald, Rachelle Hora, DPM  metolazone (ZAROXOLYN) 2.5 MG tablet Take 1 tablet (2.5 mg total) by mouth as directed. M, W, F 07/22/22   Patwardhan, Anabel Bene, MD  Multiple Vitamins-Minerals (PRESERVISION AREDS PO) Take 1 tablet by mouth in the morning and at bedtime.    [provider]  nitroGLYCERIN (NITROSTAT) 0.4 MG SL tablet TAKE 1 TABLET EVERY 5 MINUTES AS NEEDED FOR CHEST PAIN Patient taking differently: Place 0.4 mg under the tongue every 5 (five) minutes as needed for chest pain. 08/18/21   Yates Decamp, MD  potassium chloride 20 MEQ TBCR Take 2 tablets (40 mEq total) by mouth 3 (three) times daily. 07/08/22   Patwardhan, Anabel Bene, MD  potassium chloride SA (KLOR-CON M) 20 MEQ tablet Take by mouth. 07/26/22   [provider]  rosuvastatin (CRESTOR) 10 MG tablet Take 10 mg by mouth daily at 8 pm. (2000)    [provider]      Allergies    Nsaids    Review of Systems   Review of Systems  Physical Exam Updated Vital Signs Temp (!) 95.1 F (35.1 C) (Rectal)   Ht 5\' 10"  (1.778 m)   Wt 87.5 kg   BMI 27.68 kg/m  Physical Exam Vitals and nursing note reviewed.  Constitutional:      General: He is not in acute distress.    Appearance: He is well-developed. He is not diaphoretic.  HENT:     Head: Normocephalic and atraumatic.     Mouth/Throat:     Mouth: Mucous membranes are dry.  Eyes:     General: No scleral icterus.     Conjunctiva/sclera: Conjunctivae normal.  Cardiovascular:     Rate and Rhythm: Normal rate and regular rhythm.     Heart sounds: Normal heart sounds.  Pulmonary:     Effort: Pulmonary effort is normal. No respiratory distress.     Breath sounds: Normal breath sounds.  Abdominal:     Palpations: Abdomen is soft.     Tenderness: There is no abdominal tenderness.  Genitourinary:    Comments: Significant scrotal and penile edema Musculoskeletal:     Cervical back: Normal range of motion and neck supple.     Comments: BL pitting edema to the Waist  Skin:    General: Skin is warm and dry.     Comments: Dried stool noted across the patient's back and legs.  BL unna boots in place, removed, no obvious sores LE are decompressed  Neurological:     Mental Status: He is alert. He is disoriented and confused.     Cranial Nerves: No facial asymmetry.  Psychiatric:        Behavior: Behavior normal.    ED Results / Procedures / Treatments   Labs (all labs ordered are listed, but only abnormal results are displayed) Labs Reviewed - No data to display  EKG None  Radiology No results found.  Procedures .Critical Care  Performed by: Arthor Captain, PA-C Authorized by: Arthor Captain, PA-C   Critical care provider statement:    Critical care time (minutes):  75   Critical care time was exclusive of:  Separately billable procedures and treating other patients   Critical care was necessary to treat or prevent imminent or life-threatening deterioration of the following conditions:  Sepsis and CNS failure or compromise (hyothermia)   Critical care was time spent personally by me on the following activities:  Development of treatment plan with patient or surrogate, discussions with consultants, evaluation of patient's response to treatment, examination of patient, ordering and review of laboratory studies, ordering and review of radiographic studies, ordering and performing treatments and  interventions, pulse oximetry, re-evaluation of patient's condition and review of old charts   {Document cardiac monitor, telemetry assessment procedure when appropriate:1}  Medications Ordered in ED Medications - No data to display  ED Course/ Medical Decision Making/ A&P Clinical Course as of 11/10/22 1148  Wed Dec 07, 2022  1400 Creatinine(!): 2.60 [AH]  1400 WBC: 4.3 [AH]  1400 Hemoglobin(!): 12.2 [AH]  1521 CT Head Wo Contrast [AH]  1521 CT Cervical Spine Wo Contrast [AH]  1521 DG Chest Port 1 View [AH]    Clinical Course User Index [AH] Arthor Captain, PA-C   {   Click here for ABCD2, HEART and other calculatorsREFRESH Note before signing :1}  Medical Decision Making    This patient presents to the ED for concern of AMS, this involves an extensive number of treatment options, and is a complaint that carries with it a high risk of complications and morbidity.    Co morbidities:        Social Determinants of Health:       ***  Additional history:  {Additional history obtained from ***} {External records from outside source obtained and reviewed including}  Lab Tests:  I Ordered, and personally interpreted labs.  The pertinent results include:  ***   Imaging Studies:  I ordered imaging studies including *** I independently visualized and interpreted imaging which showed *** I agree with the radiologist interpretation  Cardiac Monitoring/ECG:       The patient was maintained on a cardiac monitor.  I personally viewed and interpreted the cardiac monitored which showed an underlying rhythm of: ***  Medicines ordered and prescription drug management:  I ordered medication including Medications carbidopa-levodopa (SINEMET IR) 25-100 MG per tablet immediate release 2 tablet (2 tablets Oral Given 11/13/22 0906) apixaban (ELIQUIS) tablet 2.5 mg (2.5 mg Oral Given 11/13/22 0906) acetaminophen (TYLENOL) tablet 650 mg (650 mg Oral Given  11/12/22 1546)   Or acetaminophen (TYLENOL) suppository 650 mg ( Rectal See Alternative 11/12/22 1546) ondansetron (ZOFRAN) tablet 4 mg (has no administration in time range)   Or ondansetron (ZOFRAN) injection 4 mg (has no administration in time range) Ampicillin-Sulbactam (UNASYN) 3 g in sodium chloride 0.9 % 100 mL IVPB (3 g Intravenous New Bag/Given 11/13/22 0907) ipratropium-albuterol (DUONEB) 0.5-2.5 (3) MG/3ML nebulizer solution 3 mL (3 mLs Nebulization Given 11/12/22 1635) feeding supplement (ENSURE ENLIVE / ENSURE PLUS) liquid 237 mL (237 mLs Oral Given 11/13/22 1343) ipratropium-albuterol (DUONEB) 0.5-2.5 (3) MG/3ML nebulizer solution 3 mL (3 mLs Nebulization Given 11/13/22 0848) dextrose 5 % with KCl 20 mEq / L  infusion (0 mEq Intravenous Stopped 11/13/22 0538) (feeding supplement) PROSource Plus liquid 30 mL (30 mLs Oral Given 11/13/22 1341) lactose free nutrition (BOOST PLUS) liquid 237 mL (237 mLs Oral Given 11/13/22 1343) cyanocobalamin (VITAMIN B12) injection 1,000 mcg (1,000 mcg Subcutaneous Given 11/13/22 1342) cyanocobalamin (VITAMIN B12) tablet 1,000 mcg (has no administration in time range) dextrose 5 % with KCl 20 mEq / L  infusion (20 mEq Intravenous New Bag/Given 11/13/22 1344) albumin human 25 % solution 50 g (50 g Intravenous New Bag/Given 11/13/22 1347) furosemide (LASIX) injection 40 mg (40 mg Intravenous Given 11/13/22 1343) ceFEPIme (MAXIPIME) 2 g in sodium chloride 0.9 % 100 mL IVPB (0 g Intravenous Stopped 11-10-2022 1400) metroNIDAZOLE (FLAGYL) IVPB 500 mg (0 mg Intravenous Stopped 11-10-22 1435) vancomycin (VANCOREADY) IVPB 1500 mg/300 mL (0 mg Intravenous Stopped 11/10/2022 1600) LORazepam (ATIVAN) injection 1 mg (1 mg Intravenous Given 11/10/22 0045) furosemide (LASIX) injection 80 mg (80 mg Intravenous Given 11/10/22 0825) furosemide (LASIX) injection 40 mg (40 mg Intravenous Given 11/11/22 1100) potassium chloride (KLOR-CON) packet 40 mEq (40 mEq Oral Given 11/11/22 1555)   potassium chloride (KLOR-CON) packet 40 mEq (40 mEq Oral Given 11/12/22 0825) for *** Reevaluation of the patient after these medicines showed that the patient {resolved/improved/worsened:23923::"improved"} I have reviewed the patients home medicines and have made adjustments as needed  Test Considered:       ***  Critical Interventions:       ***  Consultations Obtained: ***  Problem List / ED Course:       (N17.9) AKI (acute kidney injury) (HCC)  (primary encounter diagnosis)   (J18.9)  Community acquired pneumonia of right upper lobe of lung  (G93.41) Metabolic encephalopathy  (T68.XXXA) Hypothermia, initial encounter   MDM: .   Dispostion:  After consideration of the diagnostic results and the patients response to treatment, I feel that the patent would benefit from ***.    Amount and/or Complexity of Data Reviewed Labs: ordered. Decision-making details documented in ED Course. Radiology: ordered. Decision-making details documented in ED Course.  Risk Prescription drug management. Decision regarding hospitalization.   ***  {Document critical care time when appropriate:1} {Document review of labs and clinical decision tools ie heart score, Chads2Vasc2 etc:1}  {Document your independent review of radiology images, and any outside records:1} {Document your discussion with family members, caretakers, and with consultants:1} {Document social determinants of health affecting pt's care:1} {Document your decision making why or why not admission, treatments were needed:1} Final Clinical Impression(s) / ED Diagnoses Final diagnoses:  None    Rx / DC Orders ED Discharge Orders     None

## 2022-11-09 NOTE — ED Notes (Signed)
RT called to evaluate pt at this time per DO Gardner.

## 2022-11-09 NOTE — ED Triage Notes (Signed)
Pt BIB EMS from home family states pt has had increased falls and weakness, states he has not been able to stand for the past week. Reports a fall this morning at 3am. Hx of Parkinsons, family reports increase in hallucinations. Intermittent confusion.  EMS VS:  128/70 HR 70 CBG 102

## 2022-11-09 NOTE — ED Notes (Signed)
Laterality: N/A;   CORONARY STENT INTERVENTION N/A 08/09/2016   Procedure: Coronary Stent Intervention;  Surgeon: Yates Decamp, MD;  Location: [facility] INVASIVE CV LAB;  Service: Cardiovascular;  Laterality: N/A;  LAD   EYE SURGERY Bilateral 05/2016   "laser on both lenses"   FEMUR IM NAIL Left 05/20/2020    Procedure: INTRAMEDULLARY (IM) NAIL FEMORAL;  Surgeon: Samson Frederic, MD;  Location: [facility];  Service: Orthopedics;  Laterality: Left;   HEMORRHOID SURGERY N/A 09/27/2017   Procedure: HEMORRHOIDECTOMY;  Surgeon: Harriette Bouillon, MD;  Location: [facility];  Service: General;  Laterality: N/A;   INGUINAL HERNIA REPAIR Left 1991   INSERTION PROSTATE RADIATION SEED  06/25/2009   LAPAROSCOPIC CHOLECYSTECTOMY  1993   MELANOMA EXCISION Left 1990s   "shoulder"   MELANOMA EXCISION Right 08/2018   right shoulder   MELANOMA EXCISION  04/20/2021   RIGHT HEART CATH N/A 03/20/2018   Procedure: RIGHT HEART CATH;  Surgeon: Elder Negus, MD;  Location: [facility] INVASIVE CV LAB;  Service: Cardiovascular;  Laterality: N/A;   RIGHT/LEFT HEART CATH AND CORONARY ANGIOGRAPHY N/A 08/09/2016   Procedure: Right/Left Heart Cath and Coronary Angiography;  Surgeon: Yates Decamp, MD;  Location: [facility] INVASIVE CV LAB;  Service: Cardiovascular;  Laterality: N/A;   SURGERY FOR BROKEN FEMUR Left 05/24/2020   TEE WITHOUT CARDIOVERSION N/A 01/25/2017   Procedure: TRANSESOPHAGEAL ECHOCARDIOGRAM (TEE);  Surgeon: Elder Negus, MD;  Location: [facility] ENDOSCOPY;  Service: Cardiovascular;  Laterality: N/A;     A IV Location/Drains/Wounds Patient Lines/Drains/Airways Status     Active Line/Drains/Airways     Name Placement date Placement time Site Days   Peripheral IV 11/20/2022 Anterior;Left Forearm 11/07/2022  1317  Forearm  less than 1   Peripheral IV 11/02/2022 20 G Distal;Posterior;Right Forearm 11/06/2022  1300  Forearm  less than 1   External Urinary Catheter 06/22/22  0555  --  140   Wound / Incision (Open or Dehisced) 06/22/22 Other (Comment) Leg Left;Anterior 06/22/22  --  Leg  140   Wound / Incision (Open or Dehisced) 06/22/22 Other (Comment) Leg Right;Anterior 06/22/22  --  Leg  140            Intake/Output Last 24 hours No intake or output data in the 24 hours ending 11/04/2022 1623  Labs/Imaging Results for orders  placed or performed during the hospital encounter of 11/15/2022 (from the past 48 hour(s))  Resp panel by RT-PCR (RSV, Flu A&B, Covid) Anterior Nasal Swab     Status: None   Collection Time: 11/24/2022 12:44 PM   Specimen: Anterior Nasal Swab  Result Value Ref Range   SARS Coronavirus 2 by RT PCR NEGATIVE NEGATIVE   Influenza A by PCR NEGATIVE NEGATIVE   Influenza B by PCR NEGATIVE NEGATIVE    Comment: (NOTE) The Xpert Xpress SARS-CoV-2/FLU/RSV plus assay is intended as an aid in the diagnosis of influenza from Nasopharyngeal swab specimens and should not be used as a sole basis for treatment. Nasal washings and aspirates are unacceptable for Xpert Xpress SARS-CoV-2/FLU/RSV testing.  Fact Sheet for Patients: BloggerCourse.com  Fact Sheet for Healthcare Providers: SeriousBroker.it  This test is not yet approved or cleared by the [place] FDA and has been authorized for detection and/or diagnosis of SARS-CoV-2 by FDA under an Emergency Use Authorization (EUA). This EUA will remain in effect (meaning this test can be used) for the duration of the COVID-19 declaration under Section 564(b)(1) of the Act, 21 U.S.C. section 360bbb-3(b)(1), unless the authorization is terminated or  Laterality: N/A;   CORONARY STENT INTERVENTION N/A 08/09/2016   Procedure: Coronary Stent Intervention;  Surgeon: Yates Decamp, MD;  Location: [facility] INVASIVE CV LAB;  Service: Cardiovascular;  Laterality: N/A;  LAD   EYE SURGERY Bilateral 05/2016   "laser on both lenses"   FEMUR IM NAIL Left 05/20/2020    Procedure: INTRAMEDULLARY (IM) NAIL FEMORAL;  Surgeon: Samson Frederic, MD;  Location: [facility];  Service: Orthopedics;  Laterality: Left;   HEMORRHOID SURGERY N/A 09/27/2017   Procedure: HEMORRHOIDECTOMY;  Surgeon: Harriette Bouillon, MD;  Location: [facility];  Service: General;  Laterality: N/A;   INGUINAL HERNIA REPAIR Left 1991   INSERTION PROSTATE RADIATION SEED  06/25/2009   LAPAROSCOPIC CHOLECYSTECTOMY  1993   MELANOMA EXCISION Left 1990s   "shoulder"   MELANOMA EXCISION Right 08/2018   right shoulder   MELANOMA EXCISION  04/20/2021   RIGHT HEART CATH N/A 03/20/2018   Procedure: RIGHT HEART CATH;  Surgeon: Elder Negus, MD;  Location: [facility] INVASIVE CV LAB;  Service: Cardiovascular;  Laterality: N/A;   RIGHT/LEFT HEART CATH AND CORONARY ANGIOGRAPHY N/A 08/09/2016   Procedure: Right/Left Heart Cath and Coronary Angiography;  Surgeon: Yates Decamp, MD;  Location: [facility] INVASIVE CV LAB;  Service: Cardiovascular;  Laterality: N/A;   SURGERY FOR BROKEN FEMUR Left 05/24/2020   TEE WITHOUT CARDIOVERSION N/A 01/25/2017   Procedure: TRANSESOPHAGEAL ECHOCARDIOGRAM (TEE);  Surgeon: Elder Negus, MD;  Location: [facility] ENDOSCOPY;  Service: Cardiovascular;  Laterality: N/A;     A IV Location/Drains/Wounds Patient Lines/Drains/Airways Status     Active Line/Drains/Airways     Name Placement date Placement time Site Days   Peripheral IV 11/20/2022 Anterior;Left Forearm 11/07/2022  1317  Forearm  less than 1   Peripheral IV 11/02/2022 20 G Distal;Posterior;Right Forearm 11/06/2022  1300  Forearm  less than 1   External Urinary Catheter 06/22/22  0555  --  140   Wound / Incision (Open or Dehisced) 06/22/22 Other (Comment) Leg Left;Anterior 06/22/22  --  Leg  140   Wound / Incision (Open or Dehisced) 06/22/22 Other (Comment) Leg Right;Anterior 06/22/22  --  Leg  140            Intake/Output Last 24 hours No intake or output data in the 24 hours ending 11/04/2022 1623  Labs/Imaging Results for orders  placed or performed during the hospital encounter of 11/15/2022 (from the past 48 hour(s))  Resp panel by RT-PCR (RSV, Flu A&B, Covid) Anterior Nasal Swab     Status: None   Collection Time: 11/24/2022 12:44 PM   Specimen: Anterior Nasal Swab  Result Value Ref Range   SARS Coronavirus 2 by RT PCR NEGATIVE NEGATIVE   Influenza A by PCR NEGATIVE NEGATIVE   Influenza B by PCR NEGATIVE NEGATIVE    Comment: (NOTE) The Xpert Xpress SARS-CoV-2/FLU/RSV plus assay is intended as an aid in the diagnosis of influenza from Nasopharyngeal swab specimens and should not be used as a sole basis for treatment. Nasal washings and aspirates are unacceptable for Xpert Xpress SARS-CoV-2/FLU/RSV testing.  Fact Sheet for Patients: BloggerCourse.com  Fact Sheet for Healthcare Providers: SeriousBroker.it  This test is not yet approved or cleared by the [place] FDA and has been authorized for detection and/or diagnosis of SARS-CoV-2 by FDA under an Emergency Use Authorization (EUA). This EUA will remain in effect (meaning this test can be used) for the duration of the COVID-19 declaration under Section 564(b)(1) of the Act, 21 U.S.C. section 360bbb-3(b)(1), unless the authorization is terminated or  Laterality: N/A;   CORONARY STENT INTERVENTION N/A 08/09/2016   Procedure: Coronary Stent Intervention;  Surgeon: Yates Decamp, MD;  Location: [facility] INVASIVE CV LAB;  Service: Cardiovascular;  Laterality: N/A;  LAD   EYE SURGERY Bilateral 05/2016   "laser on both lenses"   FEMUR IM NAIL Left 05/20/2020    Procedure: INTRAMEDULLARY (IM) NAIL FEMORAL;  Surgeon: Samson Frederic, MD;  Location: [facility];  Service: Orthopedics;  Laterality: Left;   HEMORRHOID SURGERY N/A 09/27/2017   Procedure: HEMORRHOIDECTOMY;  Surgeon: Harriette Bouillon, MD;  Location: [facility];  Service: General;  Laterality: N/A;   INGUINAL HERNIA REPAIR Left 1991   INSERTION PROSTATE RADIATION SEED  06/25/2009   LAPAROSCOPIC CHOLECYSTECTOMY  1993   MELANOMA EXCISION Left 1990s   "shoulder"   MELANOMA EXCISION Right 08/2018   right shoulder   MELANOMA EXCISION  04/20/2021   RIGHT HEART CATH N/A 03/20/2018   Procedure: RIGHT HEART CATH;  Surgeon: Elder Negus, MD;  Location: [facility] INVASIVE CV LAB;  Service: Cardiovascular;  Laterality: N/A;   RIGHT/LEFT HEART CATH AND CORONARY ANGIOGRAPHY N/A 08/09/2016   Procedure: Right/Left Heart Cath and Coronary Angiography;  Surgeon: Yates Decamp, MD;  Location: [facility] INVASIVE CV LAB;  Service: Cardiovascular;  Laterality: N/A;   SURGERY FOR BROKEN FEMUR Left 05/24/2020   TEE WITHOUT CARDIOVERSION N/A 01/25/2017   Procedure: TRANSESOPHAGEAL ECHOCARDIOGRAM (TEE);  Surgeon: Elder Negus, MD;  Location: [facility] ENDOSCOPY;  Service: Cardiovascular;  Laterality: N/A;     A IV Location/Drains/Wounds Patient Lines/Drains/Airways Status     Active Line/Drains/Airways     Name Placement date Placement time Site Days   Peripheral IV 11/14/2022 Anterior;Left Forearm 11/19/2022  1317  Forearm  less than 1   Peripheral IV 11/23/2022 20 G Distal;Posterior;Right Forearm 11/26/2022  1300  Forearm  less than 1   External Urinary Catheter 06/22/22  0555  --  140   Wound / Incision (Open or Dehisced) 06/22/22 Other (Comment) Leg Left;Anterior 06/22/22  --  Leg  140   Wound / Incision (Open or Dehisced) 06/22/22 Other (Comment) Leg Right;Anterior 06/22/22  --  Leg  140            Intake/Output Last 24 hours No intake or output data in the 24 hours ending 11/02/2022 1623  Labs/Imaging Results for orders  placed or performed during the hospital encounter of 11/02/2022 (from the past 48 hour(s))  Resp panel by RT-PCR (RSV, Flu A&B, Covid) Anterior Nasal Swab     Status: None   Collection Time: 11/14/2022 12:44 PM   Specimen: Anterior Nasal Swab  Result Value Ref Range   SARS Coronavirus 2 by RT PCR NEGATIVE NEGATIVE   Influenza A by PCR NEGATIVE NEGATIVE   Influenza B by PCR NEGATIVE NEGATIVE    Comment: (NOTE) The Xpert Xpress SARS-CoV-2/FLU/RSV plus assay is intended as an aid in the diagnosis of influenza from Nasopharyngeal swab specimens and should not be used as a sole basis for treatment. Nasal washings and aspirates are unacceptable for Xpert Xpress SARS-CoV-2/FLU/RSV testing.  Fact Sheet for Patients: BloggerCourse.com  Fact Sheet for Healthcare Providers: SeriousBroker.it  This test is not yet approved or cleared by the [place] FDA and has been authorized for detection and/or diagnosis of SARS-CoV-2 by FDA under an Emergency Use Authorization (EUA). This EUA will remain in effect (meaning this test can be used) for the duration of the COVID-19 declaration under Section 564(b)(1) of the Act, 21 U.S.C. section 360bbb-3(b)(1), unless the authorization is terminated or  Laterality: N/A;   CORONARY STENT INTERVENTION N/A 08/09/2016   Procedure: Coronary Stent Intervention;  Surgeon: Yates Decamp, MD;  Location: [facility] INVASIVE CV LAB;  Service: Cardiovascular;  Laterality: N/A;  LAD   EYE SURGERY Bilateral 05/2016   "laser on both lenses"   FEMUR IM NAIL Left 05/20/2020    Procedure: INTRAMEDULLARY (IM) NAIL FEMORAL;  Surgeon: Samson Frederic, MD;  Location: [facility];  Service: Orthopedics;  Laterality: Left;   HEMORRHOID SURGERY N/A 09/27/2017   Procedure: HEMORRHOIDECTOMY;  Surgeon: Harriette Bouillon, MD;  Location: [facility];  Service: General;  Laterality: N/A;   INGUINAL HERNIA REPAIR Left 1991   INSERTION PROSTATE RADIATION SEED  06/25/2009   LAPAROSCOPIC CHOLECYSTECTOMY  1993   MELANOMA EXCISION Left 1990s   "shoulder"   MELANOMA EXCISION Right 08/2018   right shoulder   MELANOMA EXCISION  04/20/2021   RIGHT HEART CATH N/A 03/20/2018   Procedure: RIGHT HEART CATH;  Surgeon: Elder Negus, MD;  Location: [facility] INVASIVE CV LAB;  Service: Cardiovascular;  Laterality: N/A;   RIGHT/LEFT HEART CATH AND CORONARY ANGIOGRAPHY N/A 08/09/2016   Procedure: Right/Left Heart Cath and Coronary Angiography;  Surgeon: Yates Decamp, MD;  Location: [facility] INVASIVE CV LAB;  Service: Cardiovascular;  Laterality: N/A;   SURGERY FOR BROKEN FEMUR Left 05/24/2020   TEE WITHOUT CARDIOVERSION N/A 01/25/2017   Procedure: TRANSESOPHAGEAL ECHOCARDIOGRAM (TEE);  Surgeon: Elder Negus, MD;  Location: [facility] ENDOSCOPY;  Service: Cardiovascular;  Laterality: N/A;     A IV Location/Drains/Wounds Patient Lines/Drains/Airways Status     Active Line/Drains/Airways     Name Placement date Placement time Site Days   Peripheral IV 11/20/2022 Anterior;Left Forearm 11/07/2022  1317  Forearm  less than 1   Peripheral IV 11/02/2022 20 G Distal;Posterior;Right Forearm 11/06/2022  1300  Forearm  less than 1   External Urinary Catheter 06/22/22  0555  --  140   Wound / Incision (Open or Dehisced) 06/22/22 Other (Comment) Leg Left;Anterior 06/22/22  --  Leg  140   Wound / Incision (Open or Dehisced) 06/22/22 Other (Comment) Leg Right;Anterior 06/22/22  --  Leg  140            Intake/Output Last 24 hours No intake or output data in the 24 hours ending 11/04/2022 1623  Labs/Imaging Results for orders  placed or performed during the hospital encounter of 11/15/2022 (from the past 48 hour(s))  Resp panel by RT-PCR (RSV, Flu A&B, Covid) Anterior Nasal Swab     Status: None   Collection Time: 11/24/2022 12:44 PM   Specimen: Anterior Nasal Swab  Result Value Ref Range   SARS Coronavirus 2 by RT PCR NEGATIVE NEGATIVE   Influenza A by PCR NEGATIVE NEGATIVE   Influenza B by PCR NEGATIVE NEGATIVE    Comment: (NOTE) The Xpert Xpress SARS-CoV-2/FLU/RSV plus assay is intended as an aid in the diagnosis of influenza from Nasopharyngeal swab specimens and should not be used as a sole basis for treatment. Nasal washings and aspirates are unacceptable for Xpert Xpress SARS-CoV-2/FLU/RSV testing.  Fact Sheet for Patients: BloggerCourse.com  Fact Sheet for Healthcare Providers: SeriousBroker.it  This test is not yet approved or cleared by the [place] FDA and has been authorized for detection and/or diagnosis of SARS-CoV-2 by FDA under an Emergency Use Authorization (EUA). This EUA will remain in effect (meaning this test can be used) for the duration of the COVID-19 declaration under Section 564(b)(1) of the Act, 21 U.S.C. section 360bbb-3(b)(1), unless the authorization is terminated or  Laterality: N/A;   CORONARY STENT INTERVENTION N/A 08/09/2016   Procedure: Coronary Stent Intervention;  Surgeon: Yates Decamp, MD;  Location: [facility] INVASIVE CV LAB;  Service: Cardiovascular;  Laterality: N/A;  LAD   EYE SURGERY Bilateral 05/2016   "laser on both lenses"   FEMUR IM NAIL Left 05/20/2020    Procedure: INTRAMEDULLARY (IM) NAIL FEMORAL;  Surgeon: Samson Frederic, MD;  Location: [facility];  Service: Orthopedics;  Laterality: Left;   HEMORRHOID SURGERY N/A 09/27/2017   Procedure: HEMORRHOIDECTOMY;  Surgeon: Harriette Bouillon, MD;  Location: [facility];  Service: General;  Laterality: N/A;   INGUINAL HERNIA REPAIR Left 1991   INSERTION PROSTATE RADIATION SEED  06/25/2009   LAPAROSCOPIC CHOLECYSTECTOMY  1993   MELANOMA EXCISION Left 1990s   "shoulder"   MELANOMA EXCISION Right 08/2018   right shoulder   MELANOMA EXCISION  04/20/2021   RIGHT HEART CATH N/A 03/20/2018   Procedure: RIGHT HEART CATH;  Surgeon: Elder Negus, MD;  Location: [facility] INVASIVE CV LAB;  Service: Cardiovascular;  Laterality: N/A;   RIGHT/LEFT HEART CATH AND CORONARY ANGIOGRAPHY N/A 08/09/2016   Procedure: Right/Left Heart Cath and Coronary Angiography;  Surgeon: Yates Decamp, MD;  Location: [facility] INVASIVE CV LAB;  Service: Cardiovascular;  Laterality: N/A;   SURGERY FOR BROKEN FEMUR Left 05/24/2020   TEE WITHOUT CARDIOVERSION N/A 01/25/2017   Procedure: TRANSESOPHAGEAL ECHOCARDIOGRAM (TEE);  Surgeon: Elder Negus, MD;  Location: [facility] ENDOSCOPY;  Service: Cardiovascular;  Laterality: N/A;     A IV Location/Drains/Wounds Patient Lines/Drains/Airways Status     Active Line/Drains/Airways     Name Placement date Placement time Site Days   Peripheral IV 11/20/2022 Anterior;Left Forearm 11/07/2022  1317  Forearm  less than 1   Peripheral IV 11/02/2022 20 G Distal;Posterior;Right Forearm 11/06/2022  1300  Forearm  less than 1   External Urinary Catheter 06/22/22  0555  --  140   Wound / Incision (Open or Dehisced) 06/22/22 Other (Comment) Leg Left;Anterior 06/22/22  --  Leg  140   Wound / Incision (Open or Dehisced) 06/22/22 Other (Comment) Leg Right;Anterior 06/22/22  --  Leg  140            Intake/Output Last 24 hours No intake or output data in the 24 hours ending 11/04/2022 1623  Labs/Imaging Results for orders  placed or performed during the hospital encounter of 11/15/2022 (from the past 48 hour(s))  Resp panel by RT-PCR (RSV, Flu A&B, Covid) Anterior Nasal Swab     Status: None   Collection Time: 11/24/2022 12:44 PM   Specimen: Anterior Nasal Swab  Result Value Ref Range   SARS Coronavirus 2 by RT PCR NEGATIVE NEGATIVE   Influenza A by PCR NEGATIVE NEGATIVE   Influenza B by PCR NEGATIVE NEGATIVE    Comment: (NOTE) The Xpert Xpress SARS-CoV-2/FLU/RSV plus assay is intended as an aid in the diagnosis of influenza from Nasopharyngeal swab specimens and should not be used as a sole basis for treatment. Nasal washings and aspirates are unacceptable for Xpert Xpress SARS-CoV-2/FLU/RSV testing.  Fact Sheet for Patients: BloggerCourse.com  Fact Sheet for Healthcare Providers: SeriousBroker.it  This test is not yet approved or cleared by the [place] FDA and has been authorized for detection and/or diagnosis of SARS-CoV-2 by FDA under an Emergency Use Authorization (EUA). This EUA will remain in effect (meaning this test can be used) for the duration of the COVID-19 declaration under Section 564(b)(1) of the Act, 21 U.S.C. section 360bbb-3(b)(1), unless the authorization is terminated or  Laterality: N/A;   CORONARY STENT INTERVENTION N/A 08/09/2016   Procedure: Coronary Stent Intervention;  Surgeon: Yates Decamp, MD;  Location: [facility] INVASIVE CV LAB;  Service: Cardiovascular;  Laterality: N/A;  LAD   EYE SURGERY Bilateral 05/2016   "laser on both lenses"   FEMUR IM NAIL Left 05/20/2020    Procedure: INTRAMEDULLARY (IM) NAIL FEMORAL;  Surgeon: Samson Frederic, MD;  Location: [facility];  Service: Orthopedics;  Laterality: Left;   HEMORRHOID SURGERY N/A 09/27/2017   Procedure: HEMORRHOIDECTOMY;  Surgeon: Harriette Bouillon, MD;  Location: [facility];  Service: General;  Laterality: N/A;   INGUINAL HERNIA REPAIR Left 1991   INSERTION PROSTATE RADIATION SEED  06/25/2009   LAPAROSCOPIC CHOLECYSTECTOMY  1993   MELANOMA EXCISION Left 1990s   "shoulder"   MELANOMA EXCISION Right 08/2018   right shoulder   MELANOMA EXCISION  04/20/2021   RIGHT HEART CATH N/A 03/20/2018   Procedure: RIGHT HEART CATH;  Surgeon: Elder Negus, MD;  Location: [facility] INVAS[facility];  Service: Cardiovascular;  Laterality: N/A;   RIGHT/LEFT HEART CATH AND CORONARY ANGIOGRAPHY N/A 08/09/2016   Procedure: Right/Left Heart Cath and Coronary Angiography;  Surgeon: Yates Decamp, MD;  Location: [facility] INVASIVE CV LAB;  Service: Cardiovascular;  Laterality: N/A;   SURGERY FOR BROKEN FEMUR Left 05/24/2020   TEE WITHOUT CARDIOVERSION N/A 01/25/2017   Procedure: TRANSESOPHAGEAL ECHOCARDIOGRAM (TEE);  Surgeon: Elder Negus, MD;  Location: [facility] ENDOS[facility];  Service: Cardiovascular;  Laterality: N/A;     A IV Location/Drains/Wounds Patient Lines/Drains/Airways Status     Active Line/Drains/Airways     Name Placement date Placement time Site Days   Peripheral IV 11/14/2022 Anterior;Left Forearm 11/19/2022  1317  Forearm  less than 1   Peripheral IV 11/23/2022 20 G Distal;Posterior;Right Forearm 11/26/2022  1300  Forearm  less than 1   External Urinary Catheter 06/22/22  0555  --  140   Wound / Incision (Open or Dehisced) 06/22/22 Other (Comment) Leg Left;Anterior 06/22/22  --  Leg  140   Wound / Incision (Open or Dehisced) 06/22/22 Other (Comment) Leg Right;Anterior 06/22/22  --  Leg  140            Intake/Output Last 24 hours No intake or output data in the 24 hours ending 11/02/2022 1623  Labs/Imaging Results for orders  placed or performed during the hospital encounter of 11/02/2022 (from the past 48 hour(s))  Resp panel by RT-PCR (RSV, Flu A&B, Covid) Anterior Nasal Swab     Status: None   Collection Time: 11/14/2022 12:44 PM   Specimen: Anterior Nasal Swab  Result Value Ref Range   SARS Coronavirus 2 by RT PCR NEGATIVE NEGATIVE   Influenza A by PCR NEGATIVE NEGATIVE   Influenza B by PCR NEGATIVE NEGATIVE    Comment: (NOTE) The Xpert Xpress SARS-CoV-2/FLU/RSV plus assay is intended as an aid in the diagnosis of influenza from Nasopharyngeal swab specimens and should not be used as a sole basis for treatment. Nasal washings and aspirates are unacceptable for Xpert Xpress SARS-CoV-2/FLU/RSV testing.  Fact Sheet for Patients: BloggerCourse.com  Fact Sheet for Healthcare Providers: SeriousBroker.it  This test is not yet approved or cleared by the [place] FDA and has been authorized for detection and/or diagnosis of SARS-CoV-2 by FDA under an Emergency Use Authorization (EUA). This EUA will remain in effect (meaning this test can be used) for the duration of the COVID-19 declaration under Section 564(b)(1) of the Act, 21 U.S.C. section 360bbb-3(b)(1), unless the authorization is terminated or  Laterality: N/A;   CORONARY STENT INTERVENTION N/A 08/09/2016   Procedure: Coronary Stent Intervention;  Surgeon: Yates Decamp, MD;  Location: [facility] INVASIVE CV LAB;  Service: Cardiovascular;  Laterality: N/A;  LAD   EYE SURGERY Bilateral 05/2016   "laser on both lenses"   FEMUR IM NAIL Left 05/20/2020    Procedure: INTRAMEDULLARY (IM) NAIL FEMORAL;  Surgeon: Samson Frederic, MD;  Location: [facility];  Service: Orthopedics;  Laterality: Left;   HEMORRHOID SURGERY N/A 09/27/2017   Procedure: HEMORRHOIDECTOMY;  Surgeon: Harriette Bouillon, MD;  Location: [facility];  Service: General;  Laterality: N/A;   INGUINAL HERNIA REPAIR Left 1991   INSERTION PROSTATE RADIATION SEED  06/25/2009   LAPAROSCOPIC CHOLECYSTECTOMY  1993   MELANOMA EXCISION Left 1990s   "shoulder"   MELANOMA EXCISION Right 08/2018   right shoulder   MELANOMA EXCISION  04/20/2021   RIGHT HEART CATH N/A 03/20/2018   Procedure: RIGHT HEART CATH;  Surgeon: Elder Negus, MD;  Location: [facility] INVASIVE CV LAB;  Service: Cardiovascular;  Laterality: N/A;   RIGHT/LEFT HEART CATH AND CORONARY ANGIOGRAPHY N/A 08/09/2016   Procedure: Right/Left Heart Cath and Coronary Angiography;  Surgeon: Yates Decamp, MD;  Location: [facility] INVASIVE CV LAB;  Service: Cardiovascular;  Laterality: N/A;   SURGERY FOR BROKEN FEMUR Left 05/24/2020   TEE WITHOUT CARDIOVERSION N/A 01/25/2017   Procedure: TRANSESOPHAGEAL ECHOCARDIOGRAM (TEE);  Surgeon: Elder Negus, MD;  Location: [facility] ENDOSCOPY;  Service: Cardiovascular;  Laterality: N/A;     A IV Location/Drains/Wounds Patient Lines/Drains/Airways Status     Active Line/Drains/Airways     Name Placement date Placement time Site Days   Peripheral IV 11/14/2022 Anterior;Left Forearm 11/19/2022  1317  Forearm  less than 1   Peripheral IV 11/23/2022 20 G Distal;Posterior;Right Forearm 11/26/2022  1300  Forearm  less than 1   External Urinary Catheter 06/22/22  0555  --  140   Wound / Incision (Open or Dehisced) 06/22/22 Other (Comment) Leg Left;Anterior 06/22/22  --  Leg  140   Wound / Incision (Open or Dehisced) 06/22/22 Other (Comment) Leg Right;Anterior 06/22/22  --  Leg  140            Intake/Output Last 24 hours No intake or output data in the 24 hours ending 11/02/2022 1623  Labs/Imaging Results for orders  placed or performed during the hospital encounter of 11/02/2022 (from the past 48 hour(s))  Resp panel by RT-PCR (RSV, Flu A&B, Covid) Anterior Nasal Swab     Status: None   Collection Time: 11/14/2022 12:44 PM   Specimen: Anterior Nasal Swab  Result Value Ref Range   SARS Coronavirus 2 by RT PCR NEGATIVE NEGATIVE   Influenza A by PCR NEGATIVE NEGATIVE   Influenza B by PCR NEGATIVE NEGATIVE    Comment: (NOTE) The Xpert Xpress SARS-CoV-2/FLU/RSV plus assay is intended as an aid in the diagnosis of influenza from Nasopharyngeal swab specimens and should not be used as a sole basis for treatment. Nasal washings and aspirates are unacceptable for Xpert Xpress SARS-CoV-2/FLU/RSV testing.  Fact Sheet for Patients: BloggerCourse.com  Fact Sheet for Healthcare Providers: [employer]  This test is not yet approved or cleared by the [place] FDA and has been authorized for detection and/or diagnosis of SARS-CoV-2 by FDA under an Emergency Use Authorization (EUA). This EUA will remain in effect (meaning this test can be used) for the duration of the COVID-19 declaration under Section 564(b)(1) of the Act, 21 U.S.C. section 360bbb-3(b)(1), unless the authorization is terminated or

## 2022-11-09 NOTE — Sepsis Progress Note (Signed)
Elink following code sepsis °

## 2022-11-09 NOTE — ED Notes (Signed)
RT at bedside and pt placed on BiPap. Hospitalist aware.

## 2022-11-09 NOTE — ED Notes (Signed)
DO Julian Reil made aware of new expiratory wheezing present on most recent assessment.

## 2022-11-09 NOTE — Progress Notes (Signed)
Pharmacy Antibiotic Note  [name] is a 87 y.o. male admitted on 11/05/2022 presenting with AMS, concern for sepsis.  Pharmacy has been consulted for vancomycin and cefepime dosing.  Vancomycin 1500 mg IV given in ED  Plan: Vancomycin 1g IV q 36h (eAUC 464) Cefepime 2g IV every 24 hours Monitor renal function, Cx and clinical progression to narrow Vancomycin levels as indicated  Height: 5\' 10"  (177.8 cm) Weight: 87.5 kg (192 lb 14.4 oz) IBW/kg (Calculated) : 73  Temp (24hrs), Avg:95.7 F (35.4 C), Min:95.1 F (35.1 C), Max:96.2 F (35.7 C)  Recent Labs  Lab 11/22/2022 1300 11/12/2022 1311 10/31/2022 1316 11/11/2022 1545  WBC 4.3  --   --   --   CREATININE 2.52*  --  2.60*  --   LATICACIDVEN  --  1.2  --  1.4    Estimated Creatinine Clearance: 21.4 mL/min (A) (by C-G formula based on SCr of 2.6 mg/dL (H)).    Allergies  Allergen Reactions   Nsaids Other (See Comments)    Cannot have any of these because he is currently taking Eliquis    Daylene Posey, PharmD, [facility] Clinical Pharmacist ED Pharmacist Phone # [phone] 11/07/2022 4:31 PM

## 2022-11-09 NOTE — H&P (Signed)
81 70 - 99 mg/dL    Comment: Glucose reference range applies only to samples taken after fasting for at least 8 hours.   Calcium, [name] 1.06 (L) 1.15 - 1.40 mmol/L   TCO2 35 (H) 22 - 32 mmol/L   Hemoglobin 13.6 13.0 - 17.0 g/dL   HCT 82.9 56.2 - 13.0 %  CBG monitoring, ED     Status: None   Collection Time: 11/27/2022  1:39 PM  Result Value Ref Range   Glucose-Capillary 84 70 - 99 mg/dL    Comment: Glucose reference range applies only to samples taken after fasting for at least 8 hours.  Urinalysis, w/ Reflex to Culture (Infection Suspected) -Urine, Catheterized     Status: Abnormal   Collection Time: 11/26/2022  2:26 PM  Result Value Ref Range   Specimen Source URINE, CATHETERIZED    Color, Urine YELLOW YELLOW   APPearance CLEAR CLEAR   Specific Gravity, Urine 1.011 1.005 - 1.030   pH 6.0 5.0 - 8.0   Glucose, UA 150 (A) NEGATIVE mg/dL   Hgb urine dipstick NEGATIVE NEGATIVE   Bilirubin Urine NEGATIVE NEGATIVE   Ketones, ur NEGATIVE NEGATIVE mg/dL   Protein, ur NEGATIVE NEGATIVE mg/dL   Nitrite NEGATIVE NEGATIVE   Leukocytes,Ua NEGATIVE NEGATIVE   RBC / HPF 0-5 0 - 5 RBC/hpf   WBC, UA 0-5 0 - 5 WBC/hpf    Comment:        Reflex urine culture not performed if WBC <=10, OR if Squamous epithelial cells >5. If Squamous epithelial cells >5 suggest recollection.    Bacteria, UA RARE (A) NONE SEEN   Squamous Epithelial / HPF 0-5 0 - 5 /HPF   Mucus PRESENT     Comment: Performed at [facility], [address] [place], [place] [address]  I-Stat Lactic Acid, ED     Status: None   Collection Time: 11/12/2022  3:45 PM  Result Value Ref Range   Lactic Acid, Venous 1.4 0.5 - 1.9  mmol/L    Chemistries  Recent Labs  Lab 11/18/2022 1300 11/12/2022 1316  NA 142 142  K 4.6 4.5  CL 97* 99  CO2 31  --   GLUCOSE 80 81  BUN 54* 57*  CREATININE 2.52* 2.60*  CALCIUM 9.5  --   AST 25  --   ALT 7  --   ALKPHOS 144*  --   BILITOT 1.7*  --    ------------------------------------------------------------------------------------------------------------------  ------------------------------------------------------------------------------------------------------------------ GFR: Estimated Creatinine Clearance: 21.4 mL/min (A) (by C-G formula based on SCr of 2.6 mg/dL (H)). Liver Function Tests: Recent Labs  Lab 11/27/2022 1300  AST 25  ALT 7  ALKPHOS 144*  BILITOT 1.7*  PROT 6.8  ALBUMIN 3.3*   No results for input(s): "LIPASE", "AMYLASE" in the last 168 hours. No results for input(s): "AMMONIA" in the last 168 hours. Coagulation Profile: Recent Labs  Lab 11/03/2022 1300  INR 1.5*   Cardiac Enzymes: Recent Labs  Lab 11/27/2022 1300  CKTOTAL 82   BNP (last 3 results) No results for input(s): "PROBNP" in the last 8760 hours. HbA1C: No results for input(s): "HGBA1C" in the last 72 hours. CBG: Recent Labs  Lab 11/07/2022 1339  GLUCAP 84   Lipid Profile: No results for input(s): "CHOL", "HDL", "LDLCALC", "TRIG", "CHOLHDL", "LDLDIRECT" in the last 72 hours. Thyroid Function Tests: No results for input(s): "TSH", "T4TOTAL", "FREET4", "T3FREE", "THYROIDAB" in the last 72 hours. Anemia Panel: No results for input(s): "VITAMINB12", "FOLATE", "FERRITIN", "TIBC", "IRON", "RETICCTPCT" in  Manish J, MD  carbidopa-levodopa (SINEMET IR) 25-100 MG tablet TAKE 2 TABLETS THREE TIMES DAILY AT 7:00AM, 11:00AM AND 4:00PM CAN TAKE EXTRA 1/2 TO 1 TABLET AS NEEDED 09/09/22   Tat, Octaviano Batty, DO  Cholecalciferol (VITAMIN D-3) 1000 units CAPS Take 1,000 Units by mouth daily. (0800)    [provider]  dapagliflozin propanediol (FARXIGA) 10 MG TABS tablet Take 1 tablet by mouth daily.    [provider]  diltiazem (CARDIZEM CD) 120 MG 24 hr capsule TAKE 1 CAPSULE EVERY DAY Patient taking differently: Take 120 mg by mouth daily. 01/31/22   Patwardhan, Anabel Bene, MD  doxycycline (VIBRAMYCIN) 100 MG capsule Take 100 mg by mouth 2 (two) times daily. 07/04/22   [provider]  furosemide (LASIX) 40 MG tablet Take 1 tablet (40 mg total) by mouth 2 (two) times daily. 07/08/22 11/05/22  Patwardhan, Anabel Bene, MD  gabapentin (NEURONTIN) 300 MG capsule Take 1 capsule (300 mg total) by mouth 3 (three) times daily. Patient taking differently: Take 300 mg by mouth daily. 05/05/20   Tat, Octaviano Batty, DO  ketoconazole (NIZORAL) 2 % cream Apply 1 Application topically daily. To both heels 08/08/22   McDonald, Rachelle Hora, DPM  metolazone (ZAROXOLYN) 2.5 MG tablet Take 1 tablet (2.5 mg total) by mouth as directed. M, W, F 07/22/22   Patwardhan, Anabel Bene, MD  Multiple Vitamins-Minerals (PRESERVISION AREDS PO) Take 1  tablet by mouth in the morning and at bedtime.    [provider]  nitroGLYCERIN (NITROSTAT) 0.4 MG SL tablet TAKE 1 TABLET EVERY 5 MINUTES AS NEEDED FOR CHEST PAIN Patient taking differently: Place 0.4 mg under the tongue every 5 (five) minutes as needed for chest pain. 08/18/21   Yates Decamp, MD  potassium chloride 20 MEQ TBCR Take 2 tablets (40 mEq total) by mouth 3 (three) times daily. 07/08/22   Patwardhan, Anabel Bene, MD  potassium chloride SA (KLOR-CON M) 20 MEQ tablet Take by mouth. 07/26/22   [provider]  rosuvastatin (CRESTOR) 10 MG tablet Take 10 mg by mouth daily at 8 pm. (2000)    [provider]     Allergies:     Allergies  Allergen Reactions   Nsaids Other (See Comments)    Cannot have any of these because he is currently taking Eliquis     Physical Exam:   Vitals  Blood pressure 101/77, pulse 74, temperature (!) 96.2 F (35.7 C), temperature source Rectal, resp. rate (!) 21, height 5\' 10"  (1.778 m), weight 87.5 kg, SpO2 94%.  1.  General: Appears lethargic, though arousable  2. Psychiatric: Lethargic but arousable to verbal stimuli  3. Neurologic: Cranial nerves II through XII grossly intact, no focal deficit noted  4. HEENMT:  Atraumatic normocephalic, extraocular muscles are intact  5. Respiratory : Lungs clear to auscultation bilaterally  6. Cardiovascular : S1-S2, regular, no murmur auscultated  7. Gastrointestinal:  Soft, nontender, no organomegaly  8. Skin:  Bilateral lower extremity has erythema, nontender to palpation       Data Review:    CBC Recent Labs  Lab 11/14/2022 1300 11/24/2022 1316  WBC 4.3  --   HGB 12.2* 13.6  HCT 40.9 40.0  PLT 109*  --   MCV 93.0  --   MCH 27.7  --   MCHC 29.8*  --   RDW 18.2*  --   LYMPHSABS 0.3*  --   MONOABS 0.3  --   EOSABS 0.1  --   BASOSABS 0.0  --     ------------------------------------------------------------------------------------------------------------------  Manish J, MD  carbidopa-levodopa (SINEMET IR) 25-100 MG tablet TAKE 2 TABLETS THREE TIMES DAILY AT 7:00AM, 11:00AM AND 4:00PM CAN TAKE EXTRA 1/2 TO 1 TABLET AS NEEDED 09/09/22   Tat, Octaviano Batty, DO  Cholecalciferol (VITAMIN D-3) 1000 units CAPS Take 1,000 Units by mouth daily. (0800)    [provider]  dapagliflozin propanediol (FARXIGA) 10 MG TABS tablet Take 1 tablet by mouth daily.    [provider]  diltiazem (CARDIZEM CD) 120 MG 24 hr capsule TAKE 1 CAPSULE EVERY DAY Patient taking differently: Take 120 mg by mouth daily. 01/31/22   Patwardhan, Anabel Bene, MD  doxycycline (VIBRAMYCIN) 100 MG capsule Take 100 mg by mouth 2 (two) times daily. 07/04/22   [provider]  furosemide (LASIX) 40 MG tablet Take 1 tablet (40 mg total) by mouth 2 (two) times daily. 07/08/22 11/05/22  Patwardhan, Anabel Bene, MD  gabapentin (NEURONTIN) 300 MG capsule Take 1 capsule (300 mg total) by mouth 3 (three) times daily. Patient taking differently: Take 300 mg by mouth daily. 05/05/20   Tat, Octaviano Batty, DO  ketoconazole (NIZORAL) 2 % cream Apply 1 Application topically daily. To both heels 08/08/22   McDonald, Rachelle Hora, DPM  metolazone (ZAROXOLYN) 2.5 MG tablet Take 1 tablet (2.5 mg total) by mouth as directed. M, W, F 07/22/22   Patwardhan, Anabel Bene, MD  Multiple Vitamins-Minerals (PRESERVISION AREDS PO) Take 1  tablet by mouth in the morning and at bedtime.    [provider]  nitroGLYCERIN (NITROSTAT) 0.4 MG SL tablet TAKE 1 TABLET EVERY 5 MINUTES AS NEEDED FOR CHEST PAIN Patient taking differently: Place 0.4 mg under the tongue every 5 (five) minutes as needed for chest pain. 08/18/21   Yates Decamp, MD  potassium chloride 20 MEQ TBCR Take 2 tablets (40 mEq total) by mouth 3 (three) times daily. 07/08/22   Patwardhan, Anabel Bene, MD  potassium chloride SA (KLOR-CON M) 20 MEQ tablet Take by mouth. 07/26/22   [provider]  rosuvastatin (CRESTOR) 10 MG tablet Take 10 mg by mouth daily at 8 pm. (2000)    [provider]     Allergies:     Allergies  Allergen Reactions   Nsaids Other (See Comments)    Cannot have any of these because he is currently taking Eliquis     Physical Exam:   Vitals  Blood pressure 101/77, pulse 74, temperature (!) 96.2 F (35.7 C), temperature source Rectal, resp. rate (!) 21, height 5\' 10"  (1.778 m), weight 87.5 kg, SpO2 94%.  1.  General: Appears lethargic, though arousable  2. Psychiatric: Lethargic but arousable to verbal stimuli  3. Neurologic: Cranial nerves II through XII grossly intact, no focal deficit noted  4. HEENMT:  Atraumatic normocephalic, extraocular muscles are intact  5. Respiratory : Lungs clear to auscultation bilaterally  6. Cardiovascular : S1-S2, regular, no murmur auscultated  7. Gastrointestinal:  Soft, nontender, no organomegaly  8. Skin:  Bilateral lower extremity has erythema, nontender to palpation       Data Review:    CBC Recent Labs  Lab 11/14/2022 1300 11/24/2022 1316  WBC 4.3  --   HGB 12.2* 13.6  HCT 40.9 40.0  PLT 109*  --   MCV 93.0  --   MCH 27.7  --   MCHC 29.8*  --   RDW 18.2*  --   LYMPHSABS 0.3*  --   MONOABS 0.3  --   EOSABS 0.1  --   BASOSABS 0.0  --     ------------------------------------------------------------------------------------------------------------------  81 70 - 99 mg/dL    Comment: Glucose reference range applies only to samples taken after fasting for at least 8 hours.   Calcium, [name] 1.06 (L) 1.15 - 1.40 mmol/L   TCO2 35 (H) 22 - 32 mmol/L   Hemoglobin 13.6 13.0 - 17.0 g/dL   HCT 82.9 56.2 - 13.0 %  CBG monitoring, ED     Status: None   Collection Time: 11/27/2022  1:39 PM  Result Value Ref Range   Glucose-Capillary 84 70 - 99 mg/dL    Comment: Glucose reference range applies only to samples taken after fasting for at least 8 hours.  Urinalysis, w/ Reflex to Culture (Infection Suspected) -Urine, Catheterized     Status: Abnormal   Collection Time: 11/17/2022  2:26 PM  Result Value Ref Range   Specimen Source URINE, CATHETERIZED    Color, Urine YELLOW YELLOW   APPearance CLEAR CLEAR   Specific Gravity, Urine 1.011 1.005 - 1.030   pH 6.0 5.0 - 8.0   Glucose, UA 150 (A) NEGATIVE mg/dL   Hgb urine dipstick NEGATIVE NEGATIVE   Bilirubin Urine NEGATIVE NEGATIVE   Ketones, ur NEGATIVE NEGATIVE mg/dL   Protein, ur NEGATIVE NEGATIVE mg/dL   Nitrite NEGATIVE NEGATIVE   Leukocytes,Ua NEGATIVE NEGATIVE   RBC / HPF 0-5 0 - 5 RBC/hpf   WBC, UA 0-5 0 - 5 WBC/hpf    Comment:        Reflex urine culture not performed if WBC <=10, OR if Squamous epithelial cells >5. If Squamous epithelial cells >5 suggest recollection.    Bacteria, UA RARE (A) NONE SEEN   Squamous Epithelial / HPF 0-5 0 - 5 /HPF   Mucus PRESENT     Comment: Performed at [facility], [address] [place], [place] [address]  I-Stat Lactic Acid, ED     Status: None   Collection Time: 11/02/2022  3:45 PM  Result Value Ref Range   Lactic Acid, Venous 1.4 0.5 - 1.9  mmol/L    Chemistries  Recent Labs  Lab 11/23/2022 1300 11/01/2022 1316  NA 142 142  K 4.6 4.5  CL 97* 99  CO2 31  --   GLUCOSE 80 81  BUN 54* 57*  CREATININE 2.52* 2.60*  CALCIUM 9.5  --   AST 25  --   ALT 7  --   ALKPHOS 144*  --   BILITOT 1.7*  --    ------------------------------------------------------------------------------------------------------------------  ------------------------------------------------------------------------------------------------------------------ GFR: Estimated Creatinine Clearance: 21.4 mL/min (A) (by C-G formula based on SCr of 2.6 mg/dL (H)). Liver Function Tests: Recent Labs  Lab 11/06/2022 1300  AST 25  ALT 7  ALKPHOS 144*  BILITOT 1.7*  PROT 6.8  ALBUMIN 3.3*   No results for input(s): "LIPASE", "AMYLASE" in the last 168 hours. No results for input(s): "AMMONIA" in the last 168 hours. Coagulation Profile: Recent Labs  Lab 11/06/2022 1300  INR 1.5*   Cardiac Enzymes: Recent Labs  Lab 11/03/2022 1300  CKTOTAL 82   BNP (last 3 results) No results for input(s): "PROBNP" in the last 8760 hours. HbA1C: No results for input(s): "HGBA1C" in the last 72 hours. CBG: Recent Labs  Lab 11/24/2022 1339  GLUCAP 84   Lipid Profile: No results for input(s): "CHOL", "HDL", "LDLCALC", "TRIG", "CHOLHDL", "LDLDIRECT" in the last 72 hours. Thyroid Function Tests: No results for input(s): "TSH", "T4TOTAL", "FREET4", "T3FREE", "THYROIDAB" in the last 72 hours. Anemia Panel: No results for input(s): "VITAMINB12", "FOLATE", "FERRITIN", "TIBC", "IRON", "RETICCTPCT" in  81 70 - 99 mg/dL    Comment: Glucose reference range applies only to samples taken after fasting for at least 8 hours.   Calcium, [name] 1.06 (L) 1.15 - 1.40 mmol/L   TCO2 35 (H) 22 - 32 mmol/L   Hemoglobin 13.6 13.0 - 17.0 g/dL   HCT 82.9 56.2 - 13.0 %  CBG monitoring, ED     Status: None   Collection Time: 11/27/2022  1:39 PM  Result Value Ref Range   Glucose-Capillary 84 70 - 99 mg/dL    Comment: Glucose reference range applies only to samples taken after fasting for at least 8 hours.  Urinalysis, w/ Reflex to Culture (Infection Suspected) -Urine, Catheterized     Status: Abnormal   Collection Time: 11/26/2022  2:26 PM  Result Value Ref Range   Specimen Source URINE, CATHETERIZED    Color, Urine YELLOW YELLOW   APPearance CLEAR CLEAR   Specific Gravity, Urine 1.011 1.005 - 1.030   pH 6.0 5.0 - 8.0   Glucose, UA 150 (A) NEGATIVE mg/dL   Hgb urine dipstick NEGATIVE NEGATIVE   Bilirubin Urine NEGATIVE NEGATIVE   Ketones, ur NEGATIVE NEGATIVE mg/dL   Protein, ur NEGATIVE NEGATIVE mg/dL   Nitrite NEGATIVE NEGATIVE   Leukocytes,Ua NEGATIVE NEGATIVE   RBC / HPF 0-5 0 - 5 RBC/hpf   WBC, UA 0-5 0 - 5 WBC/hpf    Comment:        Reflex urine culture not performed if WBC <=10, OR if Squamous epithelial cells >5. If Squamous epithelial cells >5 suggest recollection.    Bacteria, UA RARE (A) NONE SEEN   Squamous Epithelial / HPF 0-5 0 - 5 /HPF   Mucus PRESENT     Comment: Performed at [facility], [address] [place], [place] [address]  I-Stat Lactic Acid, ED     Status: None   Collection Time: 11/12/2022  3:45 PM  Result Value Ref Range   Lactic Acid, Venous 1.4 0.5 - 1.9  mmol/L    Chemistries  Recent Labs  Lab 11/18/2022 1300 11/12/2022 1316  NA 142 142  K 4.6 4.5  CL 97* 99  CO2 31  --   GLUCOSE 80 81  BUN 54* 57*  CREATININE 2.52* 2.60*  CALCIUM 9.5  --   AST 25  --   ALT 7  --   ALKPHOS 144*  --   BILITOT 1.7*  --    ------------------------------------------------------------------------------------------------------------------  ------------------------------------------------------------------------------------------------------------------ GFR: Estimated Creatinine Clearance: 21.4 mL/min (A) (by C-G formula based on SCr of 2.6 mg/dL (H)). Liver Function Tests: Recent Labs  Lab 11/27/2022 1300  AST 25  ALT 7  ALKPHOS 144*  BILITOT 1.7*  PROT 6.8  ALBUMIN 3.3*   No results for input(s): "LIPASE", "AMYLASE" in the last 168 hours. No results for input(s): "AMMONIA" in the last 168 hours. Coagulation Profile: Recent Labs  Lab 11/03/2022 1300  INR 1.5*   Cardiac Enzymes: Recent Labs  Lab 11/27/2022 1300  CKTOTAL 82   BNP (last 3 results) No results for input(s): "PROBNP" in the last 8760 hours. HbA1C: No results for input(s): "HGBA1C" in the last 72 hours. CBG: Recent Labs  Lab 11/07/2022 1339  GLUCAP 84   Lipid Profile: No results for input(s): "CHOL", "HDL", "LDLCALC", "TRIG", "CHOLHDL", "LDLDIRECT" in the last 72 hours. Thyroid Function Tests: No results for input(s): "TSH", "T4TOTAL", "FREET4", "T3FREE", "THYROIDAB" in the last 72 hours. Anemia Panel: No results for input(s): "VITAMINB12", "FOLATE", "FERRITIN", "TIBC", "IRON", "RETICCTPCT" in  the last 72 hours.  --------------------------------------------------------------------------------------------------------------- Urine analysis:    Component Value Date/Time   COLORURINE YELLOW 10/31/2022 1426   APPEARANCEUR CLEAR 11/26/2022 1426   LABSPEC 1.011 10/30/2022 1426   PHURINE 6.0 11/24/2022 1426   GLUCOSEU 150 (A) 11/26/2022 1426   HGBUR NEGATIVE 11/17/2022  1426   BILIRUBINUR NEGATIVE 11/10/2022 1426   KETONESUR NEGATIVE 11/03/2022 1426   PROTEINUR NEGATIVE 11/08/2022 1426   NITRITE NEGATIVE 11/06/2022 1426   LEUKOCYTESUR NEGATIVE 11/24/2022 1426      Imaging Results:    CT Head Wo Contrast  Result Date: 11/14/2022 CLINICAL DATA:  Mental status change, unknown cause; Polytrauma, blunt EXAM: CT HEAD WITHOUT CONTRAST CT CERVICAL SPINE WITHOUT CONTRAST TECHNIQUE: Multidetector CT imaging of the head and cervical spine was performed following the standard protocol without intravenous contrast. Multiplanar CT image reconstructions of the cervical spine were also generated. RADIATION DOSE REDUCTION: This exam was performed according to the departmental dose-optimization program which includes automated exposure control, adjustment of the mA and/or kV according to patient size and/or use of iterative reconstruction technique. COMPARISON:  None Available. FINDINGS: CT HEAD FINDINGS Brain: No evidence of acute infarction, hemorrhage, hydrocephalus, extra-axial collection or mass lesion/mass effect. Sequela of moderate chronic microvascular ischemic change. Vascular: No hyperdense vessel or unexpected calcification. Skull: Normal. Negative for fracture or focal lesion. Sinuses/Orbits: No middle ear or mastoid effusion. Paranasal sinuses are clear. Bilateral lens replacement. Orbits are otherwise unremarkable. Other: None. CT CERVICAL SPINE FINDINGS Alignment: Grade 1 anterolisthesis of C3 on C4 and C4 on C5. Trace retrolisthesis of C5 on C6. Skull base and vertebrae: No acute fracture. No primary bone lesion or focal pathologic process. Soft tissues and spinal canal: No prevertebral fluid or swelling. No visible canal hematoma. Disc levels:  No evidence of high-grade spinal canal stenosis. Upper chest: Clustered nodularity in the right upper lobe is worrisome for an underlying infectious or inflammatory process. There is a small right-sided pleural effusion.  Recommend further evaluation with a dedicated chest CT for more definitive characterization. Other: None IMPRESSION: 1. No acute intracranial abnormality. 2. No acute fracture or traumatic malalignment of the cervical spine. 3. Clustered nodularity in the right upper lobe is worrisome for an underlying infectious or inflammatory process. There is a small right-sided pleural effusion. Recommend further evaluation with a dedicated chest CT for more definitive characterization. Electronically Signed   By: Lorenza Cambridge M.D.   On: 11/24/2022 15:10   CT Cervical Spine Wo Contrast  Result Date: 11/24/2022 CLINICAL DATA:  Mental status change, unknown cause; Polytrauma, blunt EXAM: CT HEAD WITHOUT CONTRAST CT CERVICAL SPINE WITHOUT CONTRAST TECHNIQUE: Multidetector CT imaging of the head and cervical spine was performed following the standard protocol without intravenous contrast. Multiplanar CT image reconstructions of the cervical spine were also generated. RADIATION DOSE REDUCTION: This exam was performed according to the departmental dose-optimization program which includes automated exposure control, adjustment of the mA and/or kV according to patient size and/or use of iterative reconstruction technique. COMPARISON:  None Available. FINDINGS: CT HEAD FINDINGS Brain: No evidence of acute infarction, hemorrhage, hydrocephalus, extra-axial collection or mass lesion/mass effect. Sequela of moderate chronic microvascular ischemic change. Vascular: No hyperdense vessel or unexpected calcification. Skull: Normal. Negative for fracture or focal lesion. Sinuses/Orbits: No middle ear or mastoid effusion. Paranasal sinuses are clear. Bilateral lens replacement. Orbits are otherwise unremarkable. Other: None. CT CERVICAL SPINE FINDINGS Alignment: Grade 1 anterolisthesis of C3 on C4 and C4 on C5. Trace retrolisthesis of C5 on C6. Skull base and  the last 72 hours.  --------------------------------------------------------------------------------------------------------------- Urine analysis:    Component Value Date/Time   COLORURINE YELLOW 10/31/2022 1426   APPEARANCEUR CLEAR 11/26/2022 1426   LABSPEC 1.011 10/30/2022 1426   PHURINE 6.0 11/24/2022 1426   GLUCOSEU 150 (A) 11/26/2022 1426   HGBUR NEGATIVE 11/17/2022  1426   BILIRUBINUR NEGATIVE 11/10/2022 1426   KETONESUR NEGATIVE 11/03/2022 1426   PROTEINUR NEGATIVE 11/08/2022 1426   NITRITE NEGATIVE 11/06/2022 1426   LEUKOCYTESUR NEGATIVE 11/24/2022 1426      Imaging Results:    CT Head Wo Contrast  Result Date: 11/14/2022 CLINICAL DATA:  Mental status change, unknown cause; Polytrauma, blunt EXAM: CT HEAD WITHOUT CONTRAST CT CERVICAL SPINE WITHOUT CONTRAST TECHNIQUE: Multidetector CT imaging of the head and cervical spine was performed following the standard protocol without intravenous contrast. Multiplanar CT image reconstructions of the cervical spine were also generated. RADIATION DOSE REDUCTION: This exam was performed according to the departmental dose-optimization program which includes automated exposure control, adjustment of the mA and/or kV according to patient size and/or use of iterative reconstruction technique. COMPARISON:  None Available. FINDINGS: CT HEAD FINDINGS Brain: No evidence of acute infarction, hemorrhage, hydrocephalus, extra-axial collection or mass lesion/mass effect. Sequela of moderate chronic microvascular ischemic change. Vascular: No hyperdense vessel or unexpected calcification. Skull: Normal. Negative for fracture or focal lesion. Sinuses/Orbits: No middle ear or mastoid effusion. Paranasal sinuses are clear. Bilateral lens replacement. Orbits are otherwise unremarkable. Other: None. CT CERVICAL SPINE FINDINGS Alignment: Grade 1 anterolisthesis of C3 on C4 and C4 on C5. Trace retrolisthesis of C5 on C6. Skull base and vertebrae: No acute fracture. No primary bone lesion or focal pathologic process. Soft tissues and spinal canal: No prevertebral fluid or swelling. No visible canal hematoma. Disc levels:  No evidence of high-grade spinal canal stenosis. Upper chest: Clustered nodularity in the right upper lobe is worrisome for an underlying infectious or inflammatory process. There is a small right-sided pleural effusion.  Recommend further evaluation with a dedicated chest CT for more definitive characterization. Other: None IMPRESSION: 1. No acute intracranial abnormality. 2. No acute fracture or traumatic malalignment of the cervical spine. 3. Clustered nodularity in the right upper lobe is worrisome for an underlying infectious or inflammatory process. There is a small right-sided pleural effusion. Recommend further evaluation with a dedicated chest CT for more definitive characterization. Electronically Signed   By: Lorenza Cambridge M.D.   On: 11/24/2022 15:10   CT Cervical Spine Wo Contrast  Result Date: 11/24/2022 CLINICAL DATA:  Mental status change, unknown cause; Polytrauma, blunt EXAM: CT HEAD WITHOUT CONTRAST CT CERVICAL SPINE WITHOUT CONTRAST TECHNIQUE: Multidetector CT imaging of the head and cervical spine was performed following the standard protocol without intravenous contrast. Multiplanar CT image reconstructions of the cervical spine were also generated. RADIATION DOSE REDUCTION: This exam was performed according to the departmental dose-optimization program which includes automated exposure control, adjustment of the mA and/or kV according to patient size and/or use of iterative reconstruction technique. COMPARISON:  None Available. FINDINGS: CT HEAD FINDINGS Brain: No evidence of acute infarction, hemorrhage, hydrocephalus, extra-axial collection or mass lesion/mass effect. Sequela of moderate chronic microvascular ischemic change. Vascular: No hyperdense vessel or unexpected calcification. Skull: Normal. Negative for fracture or focal lesion. Sinuses/Orbits: No middle ear or mastoid effusion. Paranasal sinuses are clear. Bilateral lens replacement. Orbits are otherwise unremarkable. Other: None. CT CERVICAL SPINE FINDINGS Alignment: Grade 1 anterolisthesis of C3 on C4 and C4 on C5. Trace retrolisthesis of C5 on C6. Skull base and  Manish J, MD  carbidopa-levodopa (SINEMET IR) 25-100 MG tablet TAKE 2 TABLETS THREE TIMES DAILY AT 7:00AM, 11:00AM AND 4:00PM CAN TAKE EXTRA 1/2 TO 1 TABLET AS NEEDED 09/09/22   Tat, Octaviano Batty, DO  Cholecalciferol (VITAMIN D-3) 1000 units CAPS Take 1,000 Units by mouth daily. (0800)    [provider]  dapagliflozin propanediol (FARXIGA) 10 MG TABS tablet Take 1 tablet by mouth daily.    [provider]  diltiazem (CARDIZEM CD) 120 MG 24 hr capsule TAKE 1 CAPSULE EVERY DAY Patient taking differently: Take 120 mg by mouth daily. 01/31/22   Patwardhan, Anabel Bene, MD  doxycycline (VIBRAMYCIN) 100 MG capsule Take 100 mg by mouth 2 (two) times daily. 07/04/22   [provider]  furosemide (LASIX) 40 MG tablet Take 1 tablet (40 mg total) by mouth 2 (two) times daily. 07/08/22 11/05/22  Patwardhan, Anabel Bene, MD  gabapentin (NEURONTIN) 300 MG capsule Take 1 capsule (300 mg total) by mouth 3 (three) times daily. Patient taking differently: Take 300 mg by mouth daily. 05/05/20   Tat, Octaviano Batty, DO  ketoconazole (NIZORAL) 2 % cream Apply 1 Application topically daily. To both heels 08/08/22   McDonald, Rachelle Hora, DPM  metolazone (ZAROXOLYN) 2.5 MG tablet Take 1 tablet (2.5 mg total) by mouth as directed. M, W, F 07/22/22   Patwardhan, Anabel Bene, MD  Multiple Vitamins-Minerals (PRESERVISION AREDS PO) Take 1  tablet by mouth in the morning and at bedtime.    [provider]  nitroGLYCERIN (NITROSTAT) 0.4 MG SL tablet TAKE 1 TABLET EVERY 5 MINUTES AS NEEDED FOR CHEST PAIN Patient taking differently: Place 0.4 mg under the tongue every 5 (five) minutes as needed for chest pain. 08/18/21   Yates Decamp, MD  potassium chloride 20 MEQ TBCR Take 2 tablets (40 mEq total) by mouth 3 (three) times daily. 07/08/22   Patwardhan, Anabel Bene, MD  potassium chloride SA (KLOR-CON M) 20 MEQ tablet Take by mouth. 07/26/22   [provider]  rosuvastatin (CRESTOR) 10 MG tablet Take 10 mg by mouth daily at 8 pm. (2000)    [provider]     Allergies:     Allergies  Allergen Reactions   Nsaids Other (See Comments)    Cannot have any of these because he is currently taking Eliquis     Physical Exam:   Vitals  Blood pressure 101/77, pulse 74, temperature (!) 96.2 F (35.7 C), temperature source Rectal, resp. rate (!) 21, height 5\' 10"  (1.778 m), weight 87.5 kg, SpO2 94%.  1.  General: Appears lethargic, though arousable  2. Psychiatric: Lethargic but arousable to verbal stimuli  3. Neurologic: Cranial nerves II through XII grossly intact, no focal deficit noted  4. HEENMT:  Atraumatic normocephalic, extraocular muscles are intact  5. Respiratory : Lungs clear to auscultation bilaterally  6. Cardiovascular : S1-S2, regular, no murmur auscultated  7. Gastrointestinal:  Soft, nontender, no organomegaly  8. Skin:  Bilateral lower extremity has erythema, nontender to palpation       Data Review:    CBC Recent Labs  Lab 11/14/2022 1300 11/24/2022 1316  WBC 4.3  --   HGB 12.2* 13.6  HCT 40.9 40.0  PLT 109*  --   MCV 93.0  --   MCH 27.7  --   MCHC 29.8*  --   RDW 18.2*  --   LYMPHSABS 0.3*  --   MONOABS 0.3  --   EOSABS 0.1  --   BASOSABS 0.0  --     ------------------------------------------------------------------------------------------------------------------  81 70 - 99 mg/dL    Comment: Glucose reference range applies only to samples taken after fasting for at least 8 hours.   Calcium, [name] 1.06 (L) 1.15 - 1.40 mmol/L   TCO2 35 (H) 22 - 32 mmol/L   Hemoglobin 13.6 13.0 - 17.0 g/dL   HCT 82.9 56.2 - 13.0 %  CBG monitoring, ED     Status: None   Collection Time: 11/27/2022  1:39 PM  Result Value Ref Range   Glucose-Capillary 84 70 - 99 mg/dL    Comment: Glucose reference range applies only to samples taken after fasting for at least 8 hours.  Urinalysis, w/ Reflex to Culture (Infection Suspected) -Urine, Catheterized     Status: Abnormal   Collection Time: 11/26/2022  2:26 PM  Result Value Ref Range   Specimen Source URINE, CATHETERIZED    Color, Urine YELLOW YELLOW   APPearance CLEAR CLEAR   Specific Gravity, Urine 1.011 1.005 - 1.030   pH 6.0 5.0 - 8.0   Glucose, UA 150 (A) NEGATIVE mg/dL   Hgb urine dipstick NEGATIVE NEGATIVE   Bilirubin Urine NEGATIVE NEGATIVE   Ketones, ur NEGATIVE NEGATIVE mg/dL   Protein, ur NEGATIVE NEGATIVE mg/dL   Nitrite NEGATIVE NEGATIVE   Leukocytes,Ua NEGATIVE NEGATIVE   RBC / HPF 0-5 0 - 5 RBC/hpf   WBC, UA 0-5 0 - 5 WBC/hpf    Comment:        Reflex urine culture not performed if WBC <=10, OR if Squamous epithelial cells >5. If Squamous epithelial cells >5 suggest recollection.    Bacteria, UA RARE (A) NONE SEEN   Squamous Epithelial / HPF 0-5 0 - 5 /HPF   Mucus PRESENT     Comment: Performed at [facility], [address] [place], [place] [address]  I-Stat Lactic Acid, ED     Status: None   Collection Time: 11/12/2022  3:45 PM  Result Value Ref Range   Lactic Acid, Venous 1.4 0.5 - 1.9  mmol/L    Chemistries  Recent Labs  Lab 11/18/2022 1300 11/12/2022 1316  NA 142 142  K 4.6 4.5  CL 97* 99  CO2 31  --   GLUCOSE 80 81  BUN 54* 57*  CREATININE 2.52* 2.60*  CALCIUM 9.5  --   AST 25  --   ALT 7  --   ALKPHOS 144*  --   BILITOT 1.7*  --    ------------------------------------------------------------------------------------------------------------------  ------------------------------------------------------------------------------------------------------------------ GFR: Estimated Creatinine Clearance: 21.4 mL/min (A) (by C-G formula based on SCr of 2.6 mg/dL (H)). Liver Function Tests: Recent Labs  Lab 11/27/2022 1300  AST 25  ALT 7  ALKPHOS 144*  BILITOT 1.7*  PROT 6.8  ALBUMIN 3.3*   No results for input(s): "LIPASE", "AMYLASE" in the last 168 hours. No results for input(s): "AMMONIA" in the last 168 hours. Coagulation Profile: Recent Labs  Lab 11/03/2022 1300  INR 1.5*   Cardiac Enzymes: Recent Labs  Lab 11/27/2022 1300  CKTOTAL 82   BNP (last 3 results) No results for input(s): "PROBNP" in the last 8760 hours. HbA1C: No results for input(s): "HGBA1C" in the last 72 hours. CBG: Recent Labs  Lab 11/07/2022 1339  GLUCAP 84   Lipid Profile: No results for input(s): "CHOL", "HDL", "LDLCALC", "TRIG", "CHOLHDL", "LDLDIRECT" in the last 72 hours. Thyroid Function Tests: No results for input(s): "TSH", "T4TOTAL", "FREET4", "T3FREE", "THYROIDAB" in the last 72 hours. Anemia Panel: No results for input(s): "VITAMINB12", "FOLATE", "FERRITIN", "TIBC", "IRON", "RETICCTPCT" in

## 2022-11-09 NOTE — ED Notes (Signed)
Pt not following command appropriately at this time to take PO meds. DO Julian Reil made aware.

## 2022-11-09 NOTE — ED Notes (Signed)
Placed a external cath patient is resting with nurse at bedside and call bell in reach

## 2022-11-09 NOTE — ED Notes (Signed)
[employer] applied at this time.

## 2022-11-10 ENCOUNTER — Inpatient Hospital Stay (HOSPITAL_COMMUNITY): Payer: Medicare Other

## 2022-11-10 ENCOUNTER — Other Ambulatory Visit (HOSPITAL_COMMUNITY): Payer: Self-pay

## 2022-11-10 DIAGNOSIS — N179 Acute kidney failure, unspecified: Secondary | ICD-10-CM | POA: Diagnosis not present

## 2022-11-10 DIAGNOSIS — J690 Pneumonitis due to inhalation of food and vomit: Secondary | ICD-10-CM

## 2022-11-10 DIAGNOSIS — G9341 Metabolic encephalopathy: Secondary | ICD-10-CM | POA: Diagnosis not present

## 2022-11-10 DIAGNOSIS — J9601 Acute respiratory failure with hypoxia: Secondary | ICD-10-CM | POA: Diagnosis not present

## 2022-11-10 DIAGNOSIS — R569 Unspecified convulsions: Secondary | ICD-10-CM

## 2022-11-10 DIAGNOSIS — E8770 Fluid overload, unspecified: Secondary | ICD-10-CM

## 2022-11-10 DIAGNOSIS — R410 Disorientation, unspecified: Secondary | ICD-10-CM | POA: Diagnosis not present

## 2022-11-10 DIAGNOSIS — R4182 Altered mental status, unspecified: Secondary | ICD-10-CM | POA: Diagnosis not present

## 2022-11-10 DIAGNOSIS — G9340 Encephalopathy, unspecified: Secondary | ICD-10-CM

## 2022-11-10 LAB — BLOOD GAS, ARTERIAL
Acid-Base Excess: 10.2 mmol/L — ABNORMAL HIGH (ref 0.0–2.0)
Bicarbonate: 36.1 mmol/L — ABNORMAL HIGH (ref 20.0–28.0)
Drawn by: 336832
O2 Saturation: 97.3 %
Patient temperature: 37
pCO2 arterial: 52 mmHg — ABNORMAL HIGH (ref 32–48)
pH, Arterial: 7.45 (ref 7.35–7.45)
pO2, Arterial: 100 mmHg (ref 83–108)

## 2022-11-10 LAB — BASIC METABOLIC PANEL
Anion gap: 12 (ref 5–15)
BUN: 49 mg/dL — ABNORMAL HIGH (ref 8–23)
CO2: 33 mmol/L — ABNORMAL HIGH (ref 22–32)
Calcium: 9 mg/dL (ref 8.9–10.3)
Chloride: 103 mmol/L (ref 98–111)
Creatinine, Ser: 2.44 mg/dL — ABNORMAL HIGH (ref 0.61–1.24)
GFR, Estimated: 25 mL/min — ABNORMAL LOW (ref >60)
Glucose, Bld: 78 mg/dL (ref 70–99)
Potassium: 3.5 mmol/L (ref 3.5–5.1)
Sodium: 148 mmol/L — ABNORMAL HIGH (ref 135–145)

## 2022-11-10 LAB — AMMONIA: Ammonia: 32 umol/L (ref 9–35)

## 2022-11-10 LAB — COMPREHENSIVE METABOLIC PANEL
ALT: 10 U/L (ref 0–44)
AST: 20 U/L (ref 15–41)
Albumin: 3.1 g/dL — ABNORMAL LOW (ref 3.5–5.0)
Alkaline Phosphatase: 131 U/L — ABNORMAL HIGH (ref 38–126)
Anion gap: 15 (ref 5–15)
BUN: 50 mg/dL — ABNORMAL HIGH (ref 8–23)
CO2: 29 mmol/L (ref 22–32)
Calcium: 9 mg/dL (ref 8.9–10.3)
Chloride: 99 mmol/L (ref 98–111)
Creatinine, Ser: 2.56 mg/dL — ABNORMAL HIGH (ref 0.61–1.24)
GFR, Estimated: 24 mL/min — ABNORMAL LOW (ref >60)
Glucose, Bld: 82 mg/dL (ref 70–99)
Potassium: 4.2 mmol/L (ref 3.5–5.1)
Sodium: 143 mmol/L (ref 135–145)
Total Bilirubin: 2.1 mg/dL — ABNORMAL HIGH (ref 0.3–1.2)
Total Protein: 6.5 g/dL (ref 6.5–8.1)

## 2022-11-10 LAB — BRAIN NATRIURETIC PEPTIDE: B Natriuretic Peptide: 305.9 pg/mL — ABNORMAL HIGH (ref 0.0–100.0)

## 2022-11-10 LAB — CREATININE, URINE, RANDOM: Creatinine, Urine: 29 mg/dL

## 2022-11-10 LAB — C-REACTIVE PROTEIN: CRP: <0.5 mg/dL (ref <1.0)

## 2022-11-10 LAB — CBC
HCT: 38.3 % — ABNORMAL LOW (ref 39.0–52.0)
Hemoglobin: 11.3 g/dL — ABNORMAL LOW (ref 13.0–17.0)
MCH: 27.3 pg (ref 26.0–34.0)
MCHC: 29.5 g/dL — ABNORMAL LOW (ref 30.0–36.0)
MCV: 92.5 fL (ref 80.0–100.0)
Platelets: 115 10*3/uL — ABNORMAL LOW (ref 150–400)
RBC: 4.14 MIL/uL — ABNORMAL LOW (ref 4.22–5.81)
RDW: 18.2 % — ABNORMAL HIGH (ref 11.5–15.5)
WBC: 4.3 10*3/uL (ref 4.0–10.5)
nRBC: 0 % (ref 0.0–0.2)

## 2022-11-10 LAB — SODIUM, URINE, RANDOM: Sodium, Ur: 92 mmol/L

## 2022-11-10 LAB — URIC ACID: Uric Acid, Serum: 9.2 mg/dL — ABNORMAL HIGH (ref 3.7–8.6)

## 2022-11-10 LAB — OSMOLALITY: Osmolality: 321 mosm/kg (ref 275–295)

## 2022-11-10 LAB — PROCALCITONIN: Procalcitonin: 0.11 ng/mL

## 2022-11-10 LAB — OSMOLALITY, URINE: Osmolality, Ur: 359 mosm/kg (ref 300–900)

## 2022-11-10 MED ORDER — SODIUM CHLORIDE 0.9 % IV SOLN
3.0000 g | Freq: Two times a day (BID) | INTRAVENOUS | Status: AC
  Administered 2022-11-10 – 2022-11-15 (×12): 3 g via INTRAVENOUS
  Filled 2022-11-10 (×11): qty 8

## 2022-11-10 MED ORDER — LORAZEPAM 2 MG/ML IJ SOLN
1.0000 mg | Freq: Once | INTRAMUSCULAR | Status: AC
  Administered 2022-11-10: 1 mg via INTRAVENOUS
  Filled 2022-11-10: qty 1

## 2022-11-10 MED ORDER — FUROSEMIDE 10 MG/ML IJ SOLN
80.0000 mg | Freq: Once | INTRAMUSCULAR | Status: AC
  Administered 2022-11-10: 80 mg via INTRAVENOUS

## 2022-11-10 MED ORDER — FUROSEMIDE 10 MG/ML IJ SOLN
60.0000 mg | Freq: Once | INTRAMUSCULAR | Status: DC
  Filled 2022-11-10: qty 6

## 2022-11-10 NOTE — Consult Note (Addendum)
NAME:  [name], MRN:  [id number], DOB:  Nov 16, 1935, LOS: 1 ADMISSION DATE:  11/01/2022, CONSULTATION DATE:  9/12 REFERRING MD:  Thedore Mins, CHIEF COMPLAINT:  AMS hypoxia    History of Present Illness:  87 yo M PMH parkinsons recently started on Nuplazid for hallucinations about 43mo ago, CKD IIIb, group 2 pHTN, afib on eliquis, physical deconditioning  who was admitted to [facility] 9/11 w weakness and worsening hallucinations since starting nuplazid. It was stopped 9/10 prior to ED presentation.   Started on fluids for hypotension, abx for R sided infiltrate on CT, admitted to [facility] for management of this + encephalopathy, AKI. On 9/12 worsening oxygenation ultimately req BiPAP. Associated declining mental status, at one point unresponsive. Sounsd like SpO2 had dipped into 70s at one point. Gas is more favorable, but this is after escalating to BiPAP support   PCCM is consulted in this setting    Pertinent  Medical History  Parkinsons pHTN Afib on eliquis  CKD 3b   Significant Hospital Events: Including procedures, antibiotic start and stop dates in addition to other pertinent events   9/11 admit w hallucinations, wknss. CT w temporal lobe atrophy   9/12 worse mental status and oxygenation -- pccm and neuro consulted   Interim History / Subjective:  On biPAP   Objective   Blood pressure 134/64, pulse 71, temperature 97.8 F (36.6 C), temperature source Oral, resp. rate 16, height 5\' 10"  (1.778 m), weight 87.5 kg, SpO2 97%.    FiO2 (%):  [30 %-40 %] 30 %   Intake/Output Summary (Last 24 hours) at 11/10/2022 0858 Last data filed at 11/10/2022 0800 Gross per 24 hour  Intake 100 ml  Output 0 ml  Net 100 ml   Filed Weights   11/19/2022 1225  Weight: 87.5 kg    Examination: General: chronically and acutely ill M NAD  HENT: NCAT. BiPAP in place w good seal  Lungs: crackles  Cardiovascular: irreg rhythm  Abdomen: round soft  Extremities: BLE pitting edema  Neuro: Lethargic,  does follow commands, generalized weakness.  GU: defer  Resolved Hospital Problem list     Assessment & Plan:   Acute encephalopathy, favor metabolic  Parkinsons dz Recent hallucinations Suspected dementia-- temporal lobe atrophy on CT -sounds like recent med changes, possible encephalopathy in this setting vs paradoxical response  -consider r/o sz  P -minimize CNS depressing meds -delirium precautions  -sinemet  -ammonia ordered -neuro is consulted  Acute respiratory failure with hypoxia Elevated R hemidiaphragm Suspected aspiration PNA Volume overload  Group II pHTN DNR/I status  Physical deconditioning P -primary has spoken w sig other, is DNR/I -- BiPAP is the max resp support that can be provided  -as such, remain in progressive. No indication for ICU transfer -- otherwise is HDS  -agree w BiPAP for now, de-escalate as able. Mentation is improved enough to where I do think biPAP is safe at present  -NPO -agree w unasyn to cover possible aspiration PNA  -agree w diuresis, would check a BMP this afternoon and if his Cr hasn't bumped more than what would be expected, consider re-dosing 80mg   -if stays on BiPAP, would consider cortrak but am hopeful that he will be able to tolerate transient breaks for meds. -aspiration precautions  -do not think there is much utility to getting an ECHO acutely, maybe if he stabilizes a bit, but would expect changes reflective of known pHTN & dont think it would change current acute management   AKI on  CKD 3b  -agree w diuresis. Likely this will bump his Cr but in context of resp failure in DNR/I pt, think this is the rightfully accepted risk while we try to stabilize resp status   Afib Prolonged qtc -eliquis  -minimize prolonging agents   GOC -DNR/I -physical deconditioning  -likely dementia -parkinsons P -think that palliative care consultation is appropriate this admission, possibly today depending on how the next couple of  hours go. Right now it seems like some things are acutely improving (no longer unresponsive) but has a lot of risks for further decompensation and think that est realistic overall GOC is important. Sig other did not answer phone for me on my multiple attempts, so I have not spoken w her about this   Best Practice (right click and "Reselect all SmartList Selections" daily)   Diet/type: NPO DVT prophylaxis: DOAC GI prophylaxis: N/A Lines: N/A Foley:  N/A Code Status:  DNR Last date of multidisciplinary goals of care discussion [per primary ]  Labs   CBC: Recent Labs  Lab 11/19/2022 1300 2022/11/19 1316 11/10/22 0045  WBC 4.3  --  4.3  NEUTROABS 3.6  --   --   HGB 12.2* 13.6 11.3*  HCT 40.9 40.0 38.3*  MCV 93.0  --  92.5  PLT 109*  --  115*    Basic Metabolic Panel: Recent Labs  Lab 11/19/2022 1300 2022/11/19 1316 11/10/22 0045  NA 142 142 143  K 4.6 4.5 4.2  CL 97* 99 99  CO2 31  --  29  GLUCOSE 80 81 82  BUN 54* 57* 50*  CREATININE 2.52* 2.60* 2.56*  CALCIUM 9.5  --  9.0   GFR: Estimated Creatinine Clearance: 21.8 mL/min (A) (by C-G formula based on SCr of 2.56 mg/dL (H)). Recent Labs  Lab Nov 19, 2022 1300 Nov 19, 2022 1311 Nov 19, 2022 1528 2022-11-19 1545 11/10/22 0045  PROCALCITON  --   --  <0.10  --   --   WBC 4.3  --   --   --  4.3  LATICACIDVEN  --  1.2  --  1.4  --     Liver Function Tests: Recent Labs  Lab 11-19-22 1300 11/10/22 0045  AST 25 20  ALT 7 10  ALKPHOS 144* 131*  BILITOT 1.7* 2.1*  PROT 6.8 6.5  ALBUMIN 3.3* 3.1*   No results for input(s): "LIPASE", "AMYLASE" in the last 168 hours. No results for input(s): "AMMONIA" in the last 168 hours.  ABG    Component Value Date/Time   PHART 7.45 11/10/2022 0803   PCO2ART 52 (H) 11/10/2022 0803   PO2ART 100 11/10/2022 0803   HCO3 36.1 (H) 11/10/2022 0803   TCO2 35 (H) 2022/11/19 1316   O2SAT 97.3 11/10/2022 0803     Coagulation Profile: Recent Labs  Lab 2022-11-19 1300  INR 1.5*    Cardiac  Enzymes: Recent Labs  Lab 11-19-2022 1300  CKTOTAL 82    HbA1C: No results found for: "HGBA1C"  CBG: Recent Labs  Lab November 19, 2022 1339  GLUCAP 84    Review of Systems:   Unable to obtain due to encephalopathy   Past Medical History:  He,  has a past medical history of Arthritis, Atrial fibrillation (HCC), Coronary artery disease, Episodes of trembling, GI bleeding (08/2017), High cholesterol, History of kidney stones, Hypertension, Melanoma of shoulder (HCC) (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   Surgical History:   Past Surgical History:  Procedure Laterality Date  [place]  [place] [address] [place] [place]   CARDIOVERSION N/A 01/26/2017   Procedure: CARDIOVERSION;  Surgeon: Elder Negus, MD;  Location: [facility] ENDOSCOPY;  Service: Cardiovascular;  Laterality: N/A;   CARDIOVERSION N/A 03/06/2018   Procedure: CARDIOVERSION;  Surgeon: Elder Negus, MD;  Location: [facility] ENDOSCOPY;  Service: Cardiovascular;  Laterality: N/A;   CATARACT EXTRACTION W/ INTRAOCULAR LENS  IMPLANT, BILATERAL Bilateral 2014-02/2016   left-right; hx/notes 08/07/2016   COLONOSCOPY WITH PROPOFOL N/A 09/25/2017   Procedure: COLONOSCOPY WITH PROPOFOL;  Surgeon: Carman Ching, MD;  Location: [facility] ENDOSCOPY;  Service: Endoscopy;  Laterality: N/A;   CORONARY ANGIOPLASTY WITH STENT PLACEMENT  08/09/2016   CORONARY ATHERECTOMY N/A 08/09/2016   Procedure: Coronary Atherectomy;  Surgeon: Yates Decamp, MD;  Location: [facility] INVASIVE CV LAB;  Service: Cardiovascular;  Laterality: N/A;   CORONARY PRESSURE/FFR STUDY N/A 08/09/2016   Procedure: Intravascular Pressure Wire/FFR Study;  Surgeon: Yates Decamp, MD;  Location: [facility] INVASIVE CV LAB;  Service: Cardiovascular;  Laterality: N/A;   CORONARY STENT INTERVENTION N/A 08/09/2016   Procedure: Coronary Stent Intervention;  Surgeon: Yates Decamp, MD;  Location: [facility] INVASIVE CV LAB;   Service: Cardiovascular;  Laterality: N/A;  LAD   EYE SURGERY Bilateral 05/2016   "laser on both lenses"   FEMUR IM NAIL Left 05/20/2020   Procedure: INTRAMEDULLARY (IM) NAIL FEMORAL;  Surgeon: Samson Frederic, MD;  Location: [facility] ORS;  Service: Orthopedics;  Laterality: Left;   HEMORRHOID SURGERY N/A 09/27/2017   Procedure: HEMORRHOIDECTOMY;  Surgeon: Harriette Bouillon, MD;  Location: [facility] OR;  Service: General;  Laterality: N/A;   INGUINAL HERNIA REPAIR Left 1991   INSERTION PROSTATE RADIATION SEED  06/25/2009   LAPAROSCOPIC CHOLECYSTECTOMY  1993   MELANOMA EXCISION Left 1990s   "shoulder"   MELANOMA EXCISION Right 08/2018   right shoulder   MELANOMA EXCISION  04/20/2021   RIGHT HEART CATH N/A 03/20/2018   Procedure: RIGHT HEART CATH;  Surgeon: Elder Negus, MD;  Location: [facility] INVASIVE CV LAB;  Service: Cardiovascular;  Laterality: N/A;   RIGHT/LEFT HEART CATH AND CORONARY ANGIOGRAPHY N/A 08/09/2016   Procedure: Right/Left Heart Cath and Coronary Angiography;  Surgeon: Yates Decamp, MD;  Location: [facility] INVASIVE CV LAB;  Service: Cardiovascular;  Laterality: N/A;   SURGERY FOR BROKEN FEMUR Left 05/24/2020   TEE WITHOUT CARDIOVERSION N/A 01/25/2017   Procedure: TRANSESOPHAGEAL ECHOCARDIOGRAM (TEE);  Surgeon: Elder Negus, MD;  Location: [facility] ENDOSCOPY;  Service: Cardiovascular;  Laterality: N/A;     Social History:   reports that he has never smoked. He has never used smokeless tobacco. He reports current alcohol use of about 14.0 standard drinks of alcohol per week. He reports that he does not use drugs.   Family History:  His family history includes Cerebral aneurysm in his sister; Healthy in his daughter; Heart attack in his father.   Allergies Allergies  Allergen Reactions   Nsaids Other (See Comments)    Told to avoid due to Eliquis use   Nuplazid [Pimavanserin Tartrate] Other (See Comments)    Increased hallucinations     Home Medications  Prior to Admission  medications   Medication Sig Start Date End Date Taking? Authorizing Provider  acetaminophen (TYLENOL) 325 MG tablet Take 1-2 tablets (325-650 mg total) by mouth every 6 (six) hours as needed for mild pain (pain score 1-3 or temp > 100.5). 05/21/20  Yes Darrick Grinder, PA-C  apixaban (ELIQUIS) 2.5 MG TABS tablet Take 1 tablet (2.5 mg total) by mouth 2 (two) times  daily. (0800 & 2000) Patient taking differently: Take 2.5 mg by mouth 2 (two) times daily. 01/26/21  Yes Patwardhan, Manish J, MD  carbidopa-levodopa (SINEMET IR) 25-100 MG tablet TAKE 2 TABLETS THREE TIMES DAILY AT 7:00AM, 11:00AM AND 4:00PM CAN TAKE EXTRA 1/2 TO 1 TABLET AS NEEDED 09/09/22  Yes Tat, Octaviano Batty, DO  Cholecalciferol (VITAMIN D-3) 1000 units CAPS Take 1,000 Units by mouth daily.   Yes [provider]  diltiazem (CARDIZEM CD) 120 MG 24 hr capsule TAKE 1 CAPSULE EVERY DAY 01/31/22  Yes Patwardhan, Manish J, MD  furosemide (LASIX) 40 MG tablet Take 1 tablet (40 mg total) by mouth 2 (two) times daily. 07/08/22 01/07/23 Yes Patwardhan, Manish J, MD  gabapentin (NEURONTIN) 300 MG capsule Take 1 capsule (300 mg total) by mouth 3 (three) times daily. 05/05/20  Yes Tat, Octaviano Batty, DO  metolazone (ZAROXOLYN) 2.5 MG tablet Take 1 tablet (2.5 mg total) by mouth as directed. M, W, F Patient taking differently: Take 2.5 mg by mouth every Monday, Wednesday, and Friday. 07/22/22  Yes Patwardhan, Manish J, MD  Multiple Vitamins-Minerals (PRESERVISION AREDS PO) Take 1 tablet by mouth in the morning and at bedtime.   Yes [provider]  nitroGLYCERIN (NITROSTAT) 0.4 MG SL tablet TAKE 1 TABLET EVERY 5 MINUTES AS NEEDED FOR CHEST PAIN Patient taking differently: Place 0.4 mg under the tongue every 5 (five) minutes as needed for chest pain. 08/18/21  Yes Yates Decamp, MD  potassium chloride SA (KLOR-CON M) 20 MEQ tablet Take 40 mEq by mouth 3 (three) times daily.   Yes [provider]  rosuvastatin (CRESTOR) 10 MG tablet Take 10  mg by mouth every evening.   Yes [provider]     Critical care time: 45 min      CRITICAL CARE Performed by: Lanier Clam   Total critical care time: 45 minutes  Critical care time was exclusive of separately billable procedures and treating other patients. Critical care was necessary to treat or prevent imminent or life-threatening deterioration.  Critical care was time spent personally by me on the following activities: development of treatment plan with patient and/or surrogate as well as nursing, discussions with consultants, evaluation of patient's response to treatment, examination of patient, obtaining history from patient or surrogate, ordering and performing treatments and interventions, ordering and review of laboratory studies, ordering and review of radiographic studies, pulse oximetry and re-evaluation of patient's condition.   Tessie Fass MSN, AGACNP-BC [facility] Pulmonary/Critical Care Medicine Amion for pager  11/10/2022, 9:37 AM

## 2022-11-10 NOTE — Progress Notes (Signed)
MD aware of high bipap pressure settings and poor response from pt's mental status.     11/10/22 0808  BiPAP/CPAP/SIPAP  BiPAP/CPAP/SIPAP Pt Type Adult  BiPAP/CPAP/SIPAP V60  Mask Type Full face mask  Mask Size Large  Set Rate 20 breaths/min  Respiratory Rate 20 breaths/min  IPAP 30 cmH20  EPAP 8 cmH2O  FiO2 (%) 30 %  Minute Ventilation 12.3  Leak 12  Peak Inspiratory Pressure (PIP) 30  Tidal Volume (Vt) 300  Press High Alarm 40 cmH2O  Press Low Alarm 5 cmH2O  BiPAP/CPAP /SiPAP Vitals  Pulse Rate 71  Resp 16  SpO2 97 %  Bilateral Breath Sounds Diminished

## 2022-11-10 NOTE — ED Notes (Signed)
Pt continues to pick and pull and linens and lines, but is easily redirected. Pt has not slept in > 24 hours per previous report. Dr Julian Reil, hospitalist, made aware and order for medication placed.

## 2022-11-10 NOTE — Plan of Care (Signed)

## 2022-11-10 NOTE — Progress Notes (Addendum)
PRN Meds:.acetaminophen **OR** acetaminophen, ondansetron **OR** ondansetron (ZOFRAN) IV    Objective:   Vitals:   11/10/22 0407 11/10/22 0611 11/10/22 0630 11/10/22 0808  BP: (!) 154/80 134/64    Pulse: 75 72  71  Resp: 18 14  16   Temp: 98 F (36.7 C) 98.2 F (36.8 C) 98.5 F (36.9 C)   TempSrc: Oral Axillary    SpO2: 100% 96%  97%  Weight:      Height:        Wt Readings from Last 3 Encounters:  11/23/2022 87.5 kg  07/08/22 87.5 kg  06/22/22 89.4 kg     Intake/Output Summary (Last 24 hours) at 11/10/2022 0823 Last data filed at 11/10/2022 0800 Gross per 24 hour  Intake 100 ml  Output --  Net 100 ml     Physical Exam  Alert, moves all 4 extremities to painful stimuli, on BiPAP  [place].AT,PERRAL Supple Neck, No JVD,   Symmetrical Chest wall movement, Good air movement bilaterally, bilateral crackles RRR,No Gallops,Rubs or new Murmurs,  +ve B.Sounds, Abd Soft, No tenderness,   3+ leg edema    Data Review:    Recent Labs  Lab 11/18/2022 1300 11/18/2022 1316 11/10/22 0045  WBC 4.3  --  4.3  HGB 12.2* 13.6 11.3*  HCT 40.9 40.0 38.3*  PLT 109*  --  115*  MCV 93.0  --  92.5  MCH 27.7  --  27.3  MCHC 29.8*  --  29.5*  RDW 18.2*  --  18.2*  LYMPHSABS 0.3*  --   --   MONOABS 0.3  --   --   EOSABS 0.1  --   --   BASOSABS 0.0  --   --     Recent Labs  Lab 11/25/2022 1245 11/13/2022 1300 11/04/2022 1311 11/24/2022 1316 11/08/2022 1528 10/30/2022 1545 11/10/22 0045  NA  --  142  --  142  --   --  143  K  --  4.6  --  4.5  --   --  4.2  CL  --  97*  --  99  --   --  99  CO2  --  31  --   --   --   --  29  ANIONGAP  --  14  --   --   --   --  15  GLUCOSE  --  80  --  81  --   --  82  BUN  --  54*  --  57*  --   --  50*  CREATININE  --  2.52*  --  2.60*  --   --  2.56*  AST  --  25  --   --   --   --  20  ALT  --  7  --   --   --   --  10  ALKPHOS  --  144*  --   --   --   --  131*  BILITOT  --  1.7*  --   --    --   --  2.1*  ALBUMIN  --  3.3*  --   --   --   --  3.1*  PROCALCITON  --   --   --   --  <0.10  --   --   LATICACIDVEN  --   --  1.2  --   --  1.4  --   INR  --  1.5*  --   --   --   --   --  PROGRESS NOTE                                                                                                                                                                                                             Patient Demographics:    [name], is a 87 y.o. male, DOB - [birth year], ZOX:[id number]  Outpatient Primary MD for the patient is Merri Brunette, MD    LOS - 1  Admit date - 11/23/2022    Chief Complaint  Patient presents with   Failure To Thrive       Brief Narrative (HPI from H&P)     87 year old male with history of Parkinson's disease, pulmonary hypertension, dilated cardiomyopathy, CAD, CKD 3B baseline creatinine around 1.6 who was recently started on nuplacid for his Parkinson's about 10 days ago, he took 6 doses of that medication last being on 11/07/2022.  Per caregiver since the start of this medication he has been getting increasingly weak and confused, he is usually wheelchair-bound and cannot walk a little bit but became increasingly more lethargic and short of breath.    Brought to the ER where he was found to have hypoxic respiratory failure, lethargy, CT chest with questionable pneumonia, CT head nonacute, initially required nasal cannula oxygen however overnight became more hypoxic requiring BiPAP with 100% FiO2, transferred to my care on 11/10/2022.  Currently he is obtunded, on BiPAP with 100% FiO2.  Exam reveals 3+ leg edema and crackles, stable WBC, negative procalcitonin and ABG suggests severe hypoxic respiratory failure.   Subjective:    [name] today in bed wearing BiPAP, minimally responsive, moves to painful stimuli but other than that does not answer questions or follow commands.   Assessment  & Plan :    Acute hypoxic respiratory failure despite being on CPAP with initial FiO2 30% and IPAP 30. His exam reveals massive fluid overload, bibasilar crackles, CT chest is of  possible pneumonia however no fever, no white count no procalcitonin.  Stop IV fluids, taper down antibiotics to Unasyn, high-dose Lasix initiated, FiO2 increased to 100% with improvement in oxygenation, obtained pulmonary critical care consult as well.  He is DNR confirmed with girlfriend.  Continue to monitor cultures, continue to monitor response to diuresis with BiPAP, advance activity, titrate down FiO2 and IPAP as much as possible.  Sepsis clinically ruled  By: Lorenza Cambridge M.D.   On: 10/31/2022 15:10   CT  Cervical Spine Wo Contrast  Result Date: 11/12/2022 CLINICAL DATA:  Mental status change, unknown cause; Polytrauma, blunt EXAM: CT HEAD WITHOUT CONTRAST CT CERVICAL SPINE WITHOUT CONTRAST TECHNIQUE: Multidetector CT imaging of the head and cervical spine was performed following the standard protocol without intravenous contrast. Multiplanar CT image reconstructions of the cervical spine were also generated. RADIATION DOSE REDUCTION: This exam was performed according to the departmental dose-optimization program which includes automated exposure control, adjustment of the mA and/or kV according to patient size and/or use of iterative reconstruction technique. COMPARISON:  None Available. FINDINGS: CT HEAD FINDINGS Brain: No evidence of acute infarction, hemorrhage, hydrocephalus, extra-axial collection or mass lesion/mass effect. Sequela of moderate chronic microvascular ischemic change. Vascular: No hyperdense vessel or unexpected calcification. Skull: Normal. Negative for fracture or focal lesion. Sinuses/Orbits: No middle ear or mastoid effusion. Paranasal sinuses are clear. Bilateral lens replacement. Orbits are otherwise unremarkable. Other: None. CT CERVICAL SPINE FINDINGS Alignment: Grade 1 anterolisthesis of C3 on C4 and C4 on C5. Trace retrolisthesis of C5 on C6. Skull base and vertebrae: No acute fracture. No primary bone lesion or focal pathologic process. Soft tissues and spinal canal: No prevertebral fluid or swelling. No visible canal hematoma. Disc levels:  No evidence of high-grade spinal canal stenosis. Upper chest: Clustered nodularity in the right upper lobe is worrisome for an underlying infectious or inflammatory process. There is a small right-sided pleural effusion. Recommend further evaluation with a dedicated chest CT for more definitive characterization. Other: None IMPRESSION: 1. No acute intracranial abnormality. 2. No acute fracture or traumatic malalignment of the cervical spine. 3.  Clustered nodularity in the right upper lobe is worrisome for an underlying infectious or inflammatory process. There is a small right-sided pleural effusion. Recommend further evaluation with a dedicated chest CT for more definitive characterization. Electronically Signed   By: Lorenza Cambridge M.D.   On: 11/25/2022 15:10   DG Chest Port 1 View  Result Date: 11/27/2022 CLINICAL DATA:  Questionable sepsis - evaluate for abnormality EXAM: PORTABLE CHEST 1 VIEW COMPARISON:  CXR 06/19/20 FINDINGS: Asymmetric elevation of the right hemidiaphragm. No pleural effusion. There is a hazy opacity at the right lung base, which could represent atelectasis or infection. No radiographically apparent displaced rib fractures. Visualized upper abdomen is unremarkable. Aortic atherosclerotic calcifications. IMPRESSION: Hazy opacity at the right lung base could represent atelectasis or infection. Electronically Signed   By: Lorenza Cambridge M.D.   On: 11/22/2022 14:08      Signature  -   Susa Raring M.D on 11/10/2022 at 8:23 AM   -  To page go to [email or web address]  By: Lorenza Cambridge M.D.   On: 10/31/2022 15:10   CT  Cervical Spine Wo Contrast  Result Date: 11/12/2022 CLINICAL DATA:  Mental status change, unknown cause; Polytrauma, blunt EXAM: CT HEAD WITHOUT CONTRAST CT CERVICAL SPINE WITHOUT CONTRAST TECHNIQUE: Multidetector CT imaging of the head and cervical spine was performed following the standard protocol without intravenous contrast. Multiplanar CT image reconstructions of the cervical spine were also generated. RADIATION DOSE REDUCTION: This exam was performed according to the departmental dose-optimization program which includes automated exposure control, adjustment of the mA and/or kV according to patient size and/or use of iterative reconstruction technique. COMPARISON:  None Available. FINDINGS: CT HEAD FINDINGS Brain: No evidence of acute infarction, hemorrhage, hydrocephalus, extra-axial collection or mass lesion/mass effect. Sequela of moderate chronic microvascular ischemic change. Vascular: No hyperdense vessel or unexpected calcification. Skull: Normal. Negative for fracture or focal lesion. Sinuses/Orbits: No middle ear or mastoid effusion. Paranasal sinuses are clear. Bilateral lens replacement. Orbits are otherwise unremarkable. Other: None. CT CERVICAL SPINE FINDINGS Alignment: Grade 1 anterolisthesis of C3 on C4 and C4 on C5. Trace retrolisthesis of C5 on C6. Skull base and vertebrae: No acute fracture. No primary bone lesion or focal pathologic process. Soft tissues and spinal canal: No prevertebral fluid or swelling. No visible canal hematoma. Disc levels:  No evidence of high-grade spinal canal stenosis. Upper chest: Clustered nodularity in the right upper lobe is worrisome for an underlying infectious or inflammatory process. There is a small right-sided pleural effusion. Recommend further evaluation with a dedicated chest CT for more definitive characterization. Other: None IMPRESSION: 1. No acute intracranial abnormality. 2. No acute fracture or traumatic malalignment of the cervical spine. 3.  Clustered nodularity in the right upper lobe is worrisome for an underlying infectious or inflammatory process. There is a small right-sided pleural effusion. Recommend further evaluation with a dedicated chest CT for more definitive characterization. Electronically Signed   By: Lorenza Cambridge M.D.   On: 11/25/2022 15:10   DG Chest Port 1 View  Result Date: 11/27/2022 CLINICAL DATA:  Questionable sepsis - evaluate for abnormality EXAM: PORTABLE CHEST 1 VIEW COMPARISON:  CXR 06/19/20 FINDINGS: Asymmetric elevation of the right hemidiaphragm. No pleural effusion. There is a hazy opacity at the right lung base, which could represent atelectasis or infection. No radiographically apparent displaced rib fractures. Visualized upper abdomen is unremarkable. Aortic atherosclerotic calcifications. IMPRESSION: Hazy opacity at the right lung base could represent atelectasis or infection. Electronically Signed   By: Lorenza Cambridge M.D.   On: 11/22/2022 14:08      Signature  -   Susa Raring M.D on 11/10/2022 at 8:23 AM   -  To page go to [email or web address]  PROGRESS NOTE                                                                                                                                                                                                             Patient Demographics:    [name], is a 87 y.o. male, DOB - [birth year], ZOX:[id number]  Outpatient Primary MD for the patient is Merri Brunette, MD    LOS - 1  Admit date - 11/23/2022    Chief Complaint  Patient presents with   Failure To Thrive       Brief Narrative (HPI from H&P)     87 year old male with history of Parkinson's disease, pulmonary hypertension, dilated cardiomyopathy, CAD, CKD 3B baseline creatinine around 1.6 who was recently started on nuplacid for his Parkinson's about 10 days ago, he took 6 doses of that medication last being on 11/07/2022.  Per caregiver since the start of this medication he has been getting increasingly weak and confused, he is usually wheelchair-bound and cannot walk a little bit but became increasingly more lethargic and short of breath.    Brought to the ER where he was found to have hypoxic respiratory failure, lethargy, CT chest with questionable pneumonia, CT head nonacute, initially required nasal cannula oxygen however overnight became more hypoxic requiring BiPAP with 100% FiO2, transferred to my care on 11/10/2022.  Currently he is obtunded, on BiPAP with 100% FiO2.  Exam reveals 3+ leg edema and crackles, stable WBC, negative procalcitonin and ABG suggests severe hypoxic respiratory failure.   Subjective:    [name] today in bed wearing BiPAP, minimally responsive, moves to painful stimuli but other than that does not answer questions or follow commands.   Assessment  & Plan :    Acute hypoxic respiratory failure despite being on CPAP with initial FiO2 30% and IPAP 30. His exam reveals massive fluid overload, bibasilar crackles, CT chest is of  possible pneumonia however no fever, no white count no procalcitonin.  Stop IV fluids, taper down antibiotics to Unasyn, high-dose Lasix initiated, FiO2 increased to 100% with improvement in oxygenation, obtained pulmonary critical care consult as well.  He is DNR confirmed with girlfriend.  Continue to monitor cultures, continue to monitor response to diuresis with BiPAP, advance activity, titrate down FiO2 and IPAP as much as possible.  Sepsis clinically ruled  opacity of the right lung correlates with airspace opacity on CT chest 11/08/2022. 2.  Aortic Atherosclerosis (ICD10-I70.0). Electronically Signed   By: Tish Frederickson M.D.   On: 11/10/2022 07:08   CT CHEST WO CONTRAST  Result Date: 11/05/2022 CLINICAL DATA:  Possible sepsis failure to thrive EXAM: CT CHEST WITHOUT CONTRAST TECHNIQUE: Multidetector CT imaging of the chest was performed following the standard protocol without IV  contrast. RADIATION DOSE REDUCTION: This exam was performed according to the departmental dose-optimization program which includes automated exposure control, adjustment of the mA and/or kV according to patient size and/or use of iterative reconstruction technique. COMPARISON:  Chest x-ray 11/24/2022, 06/21/2022, CT lung bases 09/01/2017 FINDINGS: Cardiovascular: Limited without intravenous contrast. Advanced aortic atherosclerosis. No aneurysm. Extensive coronary vascular calcification. Mild cardiomegaly. No pericardial effusion Mediastinum/Nodes: Patent trachea. Slight tracheal deviation to the left from tortuous vessels. Mild air distension of the esophagus. Subcentimeter mediastinal lymph nodes. Esophagus within normal limits Lungs/Pleura: Chronic elevation of right diaphragm. Small right greater than left pleural effusions. Consolidation with air bronchograms in the right lower lobe slightly progressive compared to the lung bases on prior CT. Heterogeneous consolidation and ground-glass density in the right upper lobe. Upper Abdomen: No acute finding Musculoskeletal: No acute or suspicious osseous abnormality IMPRESSION: 1. Chronic elevation of right diaphragm. 2. Small right greater than left pleural effusions. 3. Consolidation with air bronchograms in the right lower lobe, slightly progressive compared to the lung bases on prior CT. Heterogeneous consolidation and ground-glass density in the right upper lobe, findings are suspicious for pneumonia. 4. Aortic atherosclerosis. Aortic Atherosclerosis (ICD10-I70.0). Electronically Signed   By: Jasmine Pang M.D.   On: 11/17/2022 19:36   CT Head Wo Contrast  Result Date: 11/10/2022 CLINICAL DATA:  Mental status change, unknown cause; Polytrauma, blunt EXAM: CT HEAD WITHOUT CONTRAST CT CERVICAL SPINE WITHOUT CONTRAST TECHNIQUE: Multidetector CT imaging of the head and cervical spine was performed following the standard protocol without intravenous contrast.  Multiplanar CT image reconstructions of the cervical spine were also generated. RADIATION DOSE REDUCTION: This exam was performed according to the departmental dose-optimization program which includes automated exposure control, adjustment of the mA and/or kV according to patient size and/or use of iterative reconstruction technique. COMPARISON:  None Available. FINDINGS: CT HEAD FINDINGS Brain: No evidence of acute infarction, hemorrhage, hydrocephalus, extra-axial collection or mass lesion/mass effect. Sequela of moderate chronic microvascular ischemic change. Vascular: No hyperdense vessel or unexpected calcification. Skull: Normal. Negative for fracture or focal lesion. Sinuses/Orbits: No middle ear or mastoid effusion. Paranasal sinuses are clear. Bilateral lens replacement. Orbits are otherwise unremarkable. Other: None. CT CERVICAL SPINE FINDINGS Alignment: Grade 1 anterolisthesis of C3 on C4 and C4 on C5. Trace retrolisthesis of C5 on C6. Skull base and vertebrae: No acute fracture. No primary bone lesion or focal pathologic process. Soft tissues and spinal canal: No prevertebral fluid or swelling. No visible canal hematoma. Disc levels:  No evidence of high-grade spinal canal stenosis. Upper chest: Clustered nodularity in the right upper lobe is worrisome for an underlying infectious or inflammatory process. There is a small right-sided pleural effusion. Recommend further evaluation with a dedicated chest CT for more definitive characterization. Other: None IMPRESSION: 1. No acute intracranial abnormality. 2. No acute fracture or traumatic malalignment of the cervical spine. 3. Clustered nodularity in the right upper lobe is worrisome for an underlying infectious or inflammatory process. There is a small right-sided pleural effusion. Recommend further evaluation with a dedicated chest CT for more definitive characterization. Electronically Signed

## 2022-11-10 NOTE — Progress Notes (Signed)
   11/10/22 2035  Therapy Vitals  Resp (!) 22  Patient Position (if appropriate) Sitting  Respiratory Assessment  Assessment Type Assess only  Respiratory Pattern Regular;Unlabored  Chest Assessment Chest expansion symmetrical  Cough None  Bilateral Breath Sounds Clear;Diminished  Oxygen Therapy/Pulse Ox  O2 Device Nasal Cannula  O2 Therapy Oxygen  O2 Flow Rate (L/min) 4 L/min   Pt removed from Bipap due to restraints in place. Pt not in any distress. Will continue to monitor as needed.

## 2022-11-10 NOTE — Progress Notes (Signed)
SLP Cancellation Note  Patient Details Name: [name] MRN: [id number] DOB: 1935-10-17   Cancelled treatment:       Reason Eval/Treat Not Completed: Medical issues which prohibited therapy. Re-checked on readiness for swallow evaluation. RN in room and reporting that respiratory status not stable for evaluation. Will f/u next date.   Ferdinand Lango MA, CCC-SLP  Paden Kuras Meryl 11/10/2022, 12:04 PM

## 2022-11-10 NOTE — Care Management (Addendum)
Transition of Care [facility]) - Inpatient Brief Assessment   Patient Details  Name: [name] MRN: [id number] Date of Birth: 1936/02/28  Transition of Care [facility]) CM/SW Contact:    Lockie Pares, RN Phone Number: 11/10/2022, 2:55 PM   Clinical Narrative: 87 year old lives with SO who is caregiver. Has Parkinsons, wheelchair bound. Developed hallucinations AMS. EEG reveals encephalopathy.Currently on BiPap for hypoxia and hypoventilation. DNR, consider palliative consult for GOC. Patient had [name] for [facility] in the past last discharge from services was 08/22/2022 according to [facility] will follow for needs, recommendations, and transitions of care.   Transition of Care Asessment: Insurance and Status: Insurance coverage has been reviewed Patient has primary care physician: Yes Home environment has been reviewed: Y Prior level of function:: Wheelchair bound, progressive Parkinsons Prior/Current Home Services: No current home services Social Determinants of Health Reivew: SDOH reviewed no interventions necessary Readmission risk has been reviewed: Yes Transition of care needs: transition of care needs identified, TOC will continue to follow

## 2022-11-10 NOTE — ED Notes (Signed)
AND        <80% decrease in PCT             Encourage initiation of                                             antibiotics       Encourage continuation           of antibiotics    ----------------------------     -----------------------------        PCT >= 0.50 ng/mL                  PCT > 0.50 ng/mL               AND         increase in PCT                  Strongly encourage                                      initiation of  antibiotics    Strongly encourage escalation           of antibiotics                                     -----------------------------                                           PCT <= 0.25 ng/mL                                                 OR                                        > 80% decrease in PCT                                      Discontinue / Do not initiate                                             antibiotics  Performed at [facility], [address] [place], [place] [address]   I-Stat Lactic Acid, ED     Status: None   Collection Time: 11/15/2022  3:45 PM  Result Value Ref Range   Lactic Acid, Venous 1.4 0.5 - 1.9 mmol/L  CBC     Status: Abnormal   Collection Time: 11/10/22 12:45 AM  Result Value Ref Range   WBC 4.3 4.0 - 10.5 K/uL   RBC 4.14 (L) 4.22 - 5.81 MIL/uL   Hemoglobin 11.3 (L) 13.0 - 17.0 g/dL   HCT 69.6 (L)  [facility];  Service: Cardiovascular;  Laterality: N/A;   CORONARY STENT INTERVENTION N/A 08/09/2016   Procedure: Coronary Stent Intervention;  Surgeon: Yates Decamp, MD;  Location: [facility] INVASIVE CV LAB;  Service: Cardiovascular;  Laterality: N/A;  LAD   EYE SURGERY Bilateral 05/2016   "laser on both  lenses"   FEMUR IM NAIL Left 05/20/2020   Procedure: INTRAMEDULLARY (IM) NAIL FEMORAL;  Surgeon: Samson Frederic, MD;  Location: [facility] ORS;  Service: Orthopedics;  Laterality: Left;   HEMORRHOID SURGERY N/A 09/27/2017   Procedure: HEMORRHOIDECTOMY;  Surgeon: Harriette Bouillon, MD;  Location: [facility] OR;  Service: General;  Laterality: N/A;   INGUINAL HERNIA REPAIR Left 1991   INSERTION PROSTATE RADIATION SEED  06/25/2009   LAPAROSCOPIC CHOLECYSTECTOMY  1993   MELANOMA EXCISION Left 1990s   "shoulder"   MELANOMA EXCISION Right 08/2018   right shoulder   MELANOMA EXCISION  04/20/2021   RIGHT HEART CATH N/A 03/20/2018   Procedure: RIGHT HEART CATH;  Surgeon: Elder Negus, MD;  Location: [facility] INVAS[facility] CV LAB;  Service: Cardiovascular;  Laterality: N/A;   RIGHT/LEFT HEART CATH AND CORONARY ANGIOGRAPHY N/A 08/09/2016   Procedure: Right/Left Heart Cath and Coronary Angiography;  Surgeon: Yates Decamp, MD;  Location: [facility] INVASIVE CV LAB;  Service: Cardiovascular;  Laterality: N/A;   SURGERY FOR BROKEN FEMUR Left 05/24/2020   TEE WITHOUT CARDIOVERSION N/A 01/25/2017   Procedure: TRANSESOPHAGEAL ECHOCARDIOGRAM (TEE);  Surgeon: Elder Negus, MD;  Location: [facility] ENDOSCOPY;  Service: Cardiovascular;  Laterality: N/A;     A IV Location/Drains/Wounds Patient Lines/Drains/Airways Status     Active Line/Drains/Airways     Name Placement date Placement time Site Days   Peripheral IV 11/25/2022 Anterior;Left Forearm 11/13/2022  1317  Forearm  1   Peripheral IV 11/04/2022 20 G Distal;Posterior;Right Forearm 11/23/2022  1300  Forearm  1   External Urinary Catheter 06/22/22  0555  --  141   Wound / Incision (Open or Dehisced) 06/22/22 Other (Comment) Leg Left;Anterior 06/22/22  --  Leg  141   Wound / Incision (Open or Dehisced) 06/22/22 Other (Comment) Leg Right;Anterior 06/22/22  --  Leg  141            Intake/Output Last 24 hours  Intake/Output Summary (Last 24 hours) at 11/10/2022 0429 Last data filed  at 11/10/2022 0045 Gross per 24 hour  Intake 100 ml  Output --  Net 100 ml    Labs/Imaging Results for orders placed or performed during the hospital encounter of 11/21/2022 (from the past 48 hour(s))  Resp panel by RT-PCR (RSV, Flu A&B, Covid) Anterior Nasal Swab     Status: None   Collection Time: 11/06/2022 12:44 PM   Specimen: Anterior Nasal Swab  Result Value Ref Range   SARS Coronavirus 2 by RT PCR NEGATIVE NEGATIVE   Influenza A by PCR NEGATIVE NEGATIVE   Influenza B by PCR NEGATIVE NEGATIVE    Comment: (NOTE) The Xpert Xpress SARS-CoV-2/FLU/RSV plus assay is intended as an aid in the diagnosis of influenza from Nasopharyngeal swab specimens and should not be used as a sole basis for treatment. Nasal washings and aspirates are unacceptable for Xpert Xpress SARS-CoV-2/FLU/RSV testing.  Fact Sheet for Patients: BloggerCourse.com  Fact Sheet for Healthcare Providers: SeriousBroker.it  This test is not yet approved or cleared by the [place] FDA and has been authorized for detection and/or diagnosis of SARS-CoV-2 by FDA under an Emergency Use Authorization (EUA). This EUA will remain in effect (meaning this test can be  AND        <80% decrease in PCT             Encourage initiation of                                             antibiotics       Encourage continuation           of antibiotics    ----------------------------     -----------------------------        PCT >= 0.50 ng/mL                  PCT > 0.50 ng/mL               AND         increase in PCT                  Strongly encourage                                      initiation of  antibiotics    Strongly encourage escalation           of antibiotics                                     -----------------------------                                           PCT <= 0.25 ng/mL                                                 OR                                        > 80% decrease in PCT                                      Discontinue / Do not initiate                                             antibiotics  Performed at [facility], [address] [place], [place] [address]   I-Stat Lactic Acid, ED     Status: None   Collection Time: 11/15/2022  3:45 PM  Result Value Ref Range   Lactic Acid, Venous 1.4 0.5 - 1.9 mmol/L  CBC     Status: Abnormal   Collection Time: 11/10/22 12:45 AM  Result Value Ref Range   WBC 4.3 4.0 - 10.5 K/uL   RBC 4.14 (L) 4.22 - 5.81 MIL/uL   Hemoglobin 11.3 (L) 13.0 - 17.0 g/dL   HCT 69.6 (L)  39.0 - 52.0 %   MCV 92.5 80.0 - 100.0 fL   MCH 27.3 26.0 - 34.0 pg   MCHC 29.5 (L) 30.0 - 36.0 g/dL   RDW 16.1 (H) 09.6 - 04.5 %   Platelets 115 (L) 150 - 400 K/uL   nRBC 0.0 0.0 - 0.2 %    Comment: Performed at [facility], [address] [place], [place] [address]  Comprehensive metabolic panel     Status: Abnormal   Collection Time: 11/10/22 12:45 AM  Result Value Ref Range   Sodium 143 135 - 145 mmol/L   Potassium 4.2 3.5 - 5.1 mmol/L   Chloride 99 98 - 111 mmol/L   CO2 29 22 - 32 mmol/L   Glucose, Bld 82 70 - 99 mg/dL    Comment: Glucose reference range applies only to samples taken after fasting for at least 8 hours.   BUN 50 (H) 8 - 23 mg/dL   Creatinine, Ser 1.91 (H) 0.61 - 1.24 mg/dL   Calcium 9.0 8.9 - 47.8 mg/dL   Total Protein 6.5 6.5 - 8.1 g/dL   Albumin 3.1 (L) 3.5 - 5.0 g/dL   AST 20 15 - 41 U/L   ALT 10 0 - 44 U/L   Alkaline Phosphatase 131 (H) 38 - 126 U/L   Total Bilirubin 2.1 (H) 0.3 - 1.2 mg/dL   GFR, Estimated 24 (L) >60 mL/min    Comment: (NOTE) Calculated using the CKD-EPI Creatinine Equation  (2021)    Anion gap 15 5 - 15    Comment: Performed at [facility], [address] [place], [place] [address]   CT CHEST WO CONTRAST  Result Date: 11/15/2022 CLINICAL DATA:  Possible sepsis failure to thrive EXAM: CT CHEST WITHOUT CONTRAST TECHNIQUE: Multidetector CT imaging of the chest was performed following the standard protocol without IV contrast. RADIATION DOSE REDUCTION: This exam was performed according to the departmental dose-optimization program which includes automated exposure control, adjustment of the mA and/or kV according to patient size and/or use of iterative reconstruction technique. COMPARISON:  Chest x-ray 11/23/2022, 06/21/2022, CT lung bases 09/01/2017 FINDINGS: Cardiovascular: Limited without intravenous contrast. Advanced aortic atherosclerosis. No aneurysm. Extensive coronary vascular calcification. Mild cardiomegaly. No pericardial effusion Mediastinum/Nodes: Patent trachea. Slight tracheal deviation to the left from tortuous vessels. Mild air distension of the esophagus. Subcentimeter mediastinal lymph nodes. Esophagus within normal limits Lungs/Pleura: Chronic elevation of right diaphragm. Small right greater than left pleural effusions. Consolidation with air bronchograms in the right lower lobe slightly progressive compared to the lung bases on prior CT. Heterogeneous consolidation and ground-glass density in the right upper lobe. Upper Abdomen: No acute finding Musculoskeletal: No acute or suspicious osseous abnormality IMPRESSION: 1. Chronic elevation of right diaphragm. 2. Small right greater than left pleural effusions. 3. Consolidation with air bronchograms in the right lower lobe, slightly progressive compared to the lung bases on prior CT. Heterogeneous consolidation and ground-glass density in the right upper lobe, findings are suspicious for pneumonia. 4. Aortic atherosclerosis. Aortic Atherosclerosis (ICD10-I70.0). Electronically Signed   By: Jasmine Pang  M.D.   On: 11/17/2022 19:36   CT Head Wo Contrast  Result Date: 11/02/2022 CLINICAL DATA:  Mental status change, unknown cause; Polytrauma, blunt EXAM: CT HEAD WITHOUT CONTRAST CT CERVICAL SPINE WITHOUT CONTRAST TECHNIQUE: Multidetector CT imaging of the head and cervical spine was performed following the standard protocol without intravenous contrast. Multiplanar CT image reconstructions of the cervical spine were also generated. RADIATION DOSE REDUCTION: This  [facility];  Service: Cardiovascular;  Laterality: N/A;   CORONARY STENT INTERVENTION N/A 08/09/2016   Procedure: Coronary Stent Intervention;  Surgeon: Yates Decamp, MD;  Location: [facility] INVASIVE CV LAB;  Service: Cardiovascular;  Laterality: N/A;  LAD   EYE SURGERY Bilateral 05/2016   "laser on both  lenses"   FEMUR IM NAIL Left 05/20/2020   Procedure: INTRAMEDULLARY (IM) NAIL FEMORAL;  Surgeon: Samson Frederic, MD;  Location: [facility] ORS;  Service: Orthopedics;  Laterality: Left;   HEMORRHOID SURGERY N/A 09/27/2017   Procedure: HEMORRHOIDECTOMY;  Surgeon: Harriette Bouillon, MD;  Location: [facility] OR;  Service: General;  Laterality: N/A;   INGUINAL HERNIA REPAIR Left 1991   INSERTION PROSTATE RADIATION SEED  06/25/2009   LAPAROSCOPIC CHOLECYSTECTOMY  1993   MELANOMA EXCISION Left 1990s   "shoulder"   MELANOMA EXCISION Right 08/2018   right shoulder   MELANOMA EXCISION  04/20/2021   RIGHT HEART CATH N/A 03/20/2018   Procedure: RIGHT HEART CATH;  Surgeon: Elder Negus, MD;  Location: [facility] LAB;  Service: Cardiovascular;  Laterality: N/A;   RIGHT/LEFT HEART CATH AND CORONARY ANGIOGRAPHY N/A 08/09/2016   Procedure: Right/Left Heart Cath and Coronary Angiography;  Surgeon: Yates Decamp, MD;  Location: [facility] INVASIVE CV LAB;  Service: Cardiovascular;  Laterality: N/A;   SURGERY FOR BROKEN FEMUR Left 05/24/2020   TEE WITHOUT CARDIOVERSION N/A 01/25/2017   Procedure: TRANSESOPHAGEAL ECHOCARDIOGRAM (TEE);  Surgeon: Elder Negus, MD;  Location: [facility] ENDOSCOPY;  Service: Cardiovascular;  Laterality: N/A;     A IV Location/Drains/Wounds Patient Lines/Drains/Airways Status     Active Line/Drains/Airways     Name Placement date Placement time Site Days   Peripheral IV 11/25/2022 Anterior;Left Forearm 11/13/2022  1317  Forearm  1   Peripheral IV 11/04/2022 20 G Distal;Posterior;Right Forearm 11/23/2022  1300  Forearm  1   External Urinary Catheter 06/22/22  0555  --  141   Wound / Incision (Open or Dehisced) 06/22/22 Other (Comment) Leg Left;Anterior 06/22/22  --  Leg  141   Wound / Incision (Open or Dehisced) 06/22/22 Other (Comment) Leg Right;Anterior 06/22/22  --  Leg  141            Intake/Output Last 24 hours  Intake/Output Summary (Last 24 hours) at 11/10/2022 0429 Last data filed  at 11/10/2022 0045 Gross per 24 hour  Intake 100 ml  Output --  Net 100 ml    Labs/Imaging Results for orders placed or performed during the hospital encounter of 11/21/2022 (from the past 48 hour(s))  Resp panel by RT-PCR (RSV, Flu A&B, Covid) Anterior Nasal Swab     Status: None   Collection Time: 11/06/2022 12:44 PM   Specimen: Anterior Nasal Swab  Result Value Ref Range   SARS Coronavirus 2 by RT PCR NEGATIVE NEGATIVE   Influenza A by PCR NEGATIVE NEGATIVE   Influenza B by PCR NEGATIVE NEGATIVE    Comment: (NOTE) The Xpert Xpress SARS-CoV-2/FLU/RSV plus assay is intended as an aid in the diagnosis of influenza from Nasopharyngeal swab specimens and should not be used as a sole basis for treatment. Nasal washings and aspirates are unacceptable for Xpert Xpress SARS-CoV-2/FLU/RSV testing.  Fact Sheet for Patients: BloggerCourse.com  Fact Sheet for Healthcare Providers: SeriousBroker.it  This test is not yet approved or cleared by the [place] FDA and has been authorized for detection and/or diagnosis of SARS-CoV-2 by FDA under an Emergency Use Authorization (EUA). This EUA will remain in effect (meaning this test can be  [facility];  Service: Cardiovascular;  Laterality: N/A;   CORONARY STENT INTERVENTION N/A 08/09/2016   Procedure: Coronary Stent Intervention;  Surgeon: Yates Decamp, MD;  Location: [facility] INVASIVE CV LAB;  Service: Cardiovascular;  Laterality: N/A;  LAD   EYE SURGERY Bilateral 05/2016   "laser on both  lenses"   FEMUR IM NAIL Left 05/20/2020   Procedure: INTRAMEDULLARY (IM) NAIL FEMORAL;  Surgeon: Samson Frederic, MD;  Location: [facility];  Service: Orthopedics;  Laterality: Left;   HEMORRHOID SURGERY N/A 09/27/2017   Procedure: HEMORRHOIDECTOMY;  Surgeon: Harriette Bouillon, MD;  Location: [facility];  Service: General;  Laterality: N/A;   INGUINAL HERNIA REPAIR Left 1991   INSERTION PROSTATE RADIATION SEED  06/25/2009   LAPAROSCOPIC CHOLECYSTECTOMY  1993   MELANOMA EXCISION Left 1990s   "shoulder"   MELANOMA EXCISION Right 08/2018   right shoulder   MELANOMA EXCISION  04/20/2021   RIGHT HEART CATH N/A 03/20/2018   Procedure: RIGHT HEART CATH;  Surgeon: Elder Negus, MD;  Location: [facility];  Service: Cardiovascular;  Laterality: N/A;   RIGHT/LEFT HEART CATH AND CORONARY ANGIOGRAPHY N/A 08/09/2016   Procedure: Right/Left Heart Cath and Coronary Angiography;  Surgeon: Yates Decamp, MD;  Location: [facility] INVASIVE CV LAB;  Service: Cardiovascular;  Laterality: N/A;   SURGERY FOR BROKEN FEMUR Left 05/24/2020   TEE WITHOUT CARDIOVERSION N/A 01/25/2017   Procedure: TRANSESOPHAGEAL ECHOCARDIOGRAM (TEE);  Surgeon: Elder Negus, MD;  Location: [facility] ENDOSCOPY;  Service: Cardiovascular;  Laterality: N/A;     A IV Location/Drains/Wounds Patient Lines/Drains/Airways Status     Active Line/Drains/Airways     Name Placement date Placement time Site Days   Peripheral IV 11/25/2022 Anterior;Left Forearm 11/13/2022  1317  Forearm  1   Peripheral IV 11/04/2022 20 G Distal;Posterior;Right Forearm 11/23/2022  1300  Forearm  1   External Urinary Catheter 06/22/22  0555  --  141   Wound / Incision (Open or Dehisced) 06/22/22 Other (Comment) Leg Left;Anterior 06/22/22  --  Leg  141   Wound / Incision (Open or Dehisced) 06/22/22 Other (Comment) Leg Right;Anterior 06/22/22  --  Leg  141            Intake/Output Last 24 hours  Intake/Output Summary (Last 24 hours) at 11/10/2022 0429 Last data filed  at 11/10/2022 0045 Gross per 24 hour  Intake 100 ml  Output --  Net 100 ml    Labs/Imaging Results for orders placed or performed during the hospital encounter of 11/21/2022 (from the past 48 hour(s))  Resp panel by RT-PCR (RSV, Flu A&B, Covid) Anterior Nasal Swab     Status: None   Collection Time: 11/06/2022 12:44 PM   Specimen: Anterior Nasal Swab  Result Value Ref Range   SARS Coronavirus 2 by RT PCR NEGATIVE NEGATIVE   Influenza A by PCR NEGATIVE NEGATIVE   Influenza B by PCR NEGATIVE NEGATIVE    Comment: (NOTE) The Xpert Xpress SARS-CoV-2/FLU/RSV plus assay is intended as an aid in the diagnosis of influenza from Nasopharyngeal swab specimens and should not be used as a sole basis for treatment. Nasal washings and aspirates are unacceptable for Xpert Xpress SARS-CoV-2/FLU/RSV testing.  Fact Sheet for Patients: BloggerCourse.com  Fact Sheet for Healthcare Providers: SeriousBroker.it  This test is not yet approved or cleared by the [place] FDA and has been authorized for detection and/or diagnosis of SARS-CoV-2 by FDA under an Emergency Use Authorization (EUA). This EUA will remain in effect (meaning this test can be  Hgb urine dipstick NEGATIVE NEGATIVE   Bilirubin Urine NEGATIVE NEGATIVE   Ketones, ur NEGATIVE NEGATIVE mg/dL   Protein, ur NEGATIVE NEGATIVE mg/dL   Nitrite NEGATIVE NEGATIVE   Leukocytes,Ua NEGATIVE NEGATIVE   RBC / HPF 0-5 0 - 5 RBC/hpf   WBC, UA 0-5 0 - 5 WBC/hpf    Comment:        Reflex urine culture not performed if WBC <=10, OR if Squamous epithelial cells >5. If Squamous epithelial cells >5 suggest recollection.    Bacteria, UA RARE (A) NONE SEEN   Squamous Epithelial / HPF 0-5 0 - 5 /HPF   Mucus PRESENT     Comment: Performed at [facility], [address], [place], [place] [address]  Troponin I (High Sensitivity)     Status: Abnormal   Collection Time: 11/28/2022  3:28 PM  Result Value Ref Range   Troponin I (High Sensitivity) 24 (H) <18 ng/L    Comment:  (NOTE) Elevated high sensitivity troponin I (hsTnI) values and significant  changes across serial measurements may suggest ACS but many other  chronic and acute conditions are known to elevate hsTnI results.  Refer to the "Links" section for chest pain algorithms and additional  guidance. Performed at [facility], [address], [place], [place] [address]   Procalcitonin     Status: None   Collection Time: 11/10/2022  3:28 PM  Result Value Ref Range   Procalcitonin <0.10 ng/mL    Comment:        Interpretation: PCT (Procalcitonin) <= 0.5 ng/mL: Systemic infection (sepsis) is not likely. Local bacterial infection is possible. (NOTE)       Sepsis PCT Algorithm           Lower Respiratory Tract                                      Infection PCT Algorithm    ----------------------------     ----------------------------         PCT < 0.25 ng/mL                PCT < 0.10 ng/mL          Strongly encourage             Strongly discourage   discontinuation of antibiotics    initiation of antibiotics    ----------------------------     -----------------------------       PCT 0.25 - 0.50 ng/mL            PCT 0.10 - 0.25 ng/mL               OR       >80% decrease in PCT            Discourage initiation of                                            antibiotics      Encourage discontinuation           of antibiotics    ----------------------------     -----------------------------         PCT >= 0.50 ng/mL              PCT 0.26 - 0.50 ng/mL  AND        <80% decrease in PCT             Encourage initiation of                                             antibiotics       Encourage continuation           of antibiotics    ----------------------------     -----------------------------        PCT >= 0.50 ng/mL                  PCT > 0.50 ng/mL               AND         increase in PCT                  Strongly encourage                                      initiation of  antibiotics    Strongly encourage escalation           of antibiotics                                     -----------------------------                                           PCT <= 0.25 ng/mL                                                 OR                                        > 80% decrease in PCT                                      Discontinue / Do not initiate                                             antibiotics  Performed at [facility], [address] [place], [place] [address]   I-Stat Lactic Acid, ED     Status: None   Collection Time: 11/15/2022  3:45 PM  Result Value Ref Range   Lactic Acid, Venous 1.4 0.5 - 1.9 mmol/L  CBC     Status: Abnormal   Collection Time: 11/10/22 12:45 AM  Result Value Ref Range   WBC 4.3 4.0 - 10.5 K/uL   RBC 4.14 (L) 4.22 - 5.81 MIL/uL   Hemoglobin 11.3 (L) 13.0 - 17.0 g/dL   HCT 69.6 (L)  39.0 - 52.0 %   MCV 92.5 80.0 - 100.0 fL   MCH 27.3 26.0 - 34.0 pg   MCHC 29.5 (L) 30.0 - 36.0 g/dL   RDW 16.1 (H) 09.6 - 04.5 %   Platelets 115 (L) 150 - 400 K/uL   nRBC 0.0 0.0 - 0.2 %    Comment: Performed at [facility], [address] [place], [place] [address]  Comprehensive metabolic panel     Status: Abnormal   Collection Time: 11/10/22 12:45 AM  Result Value Ref Range   Sodium 143 135 - 145 mmol/L   Potassium 4.2 3.5 - 5.1 mmol/L   Chloride 99 98 - 111 mmol/L   CO2 29 22 - 32 mmol/L   Glucose, Bld 82 70 - 99 mg/dL    Comment: Glucose reference range applies only to samples taken after fasting for at least 8 hours.   BUN 50 (H) 8 - 23 mg/dL   Creatinine, Ser 1.91 (H) 0.61 - 1.24 mg/dL   Calcium 9.0 8.9 - 47.8 mg/dL   Total Protein 6.5 6.5 - 8.1 g/dL   Albumin 3.1 (L) 3.5 - 5.0 g/dL   AST 20 15 - 41 U/L   ALT 10 0 - 44 U/L   Alkaline Phosphatase 131 (H) 38 - 126 U/L   Total Bilirubin 2.1 (H) 0.3 - 1.2 mg/dL   GFR, Estimated 24 (L) >60 mL/min    Comment: (NOTE) Calculated using the CKD-EPI Creatinine Equation  (2021)    Anion gap 15 5 - 15    Comment: Performed at [facility], [address] [place], [place] [address]   CT CHEST WO CONTRAST  Result Date: 11/15/2022 CLINICAL DATA:  Possible sepsis failure to thrive EXAM: CT CHEST WITHOUT CONTRAST TECHNIQUE: Multidetector CT imaging of the chest was performed following the standard protocol without IV contrast. RADIATION DOSE REDUCTION: This exam was performed according to the departmental dose-optimization program which includes automated exposure control, adjustment of the mA and/or kV according to patient size and/or use of iterative reconstruction technique. COMPARISON:  Chest x-ray 11/23/2022, 06/21/2022, CT lung bases 09/01/2017 FINDINGS: Cardiovascular: Limited without intravenous contrast. Advanced aortic atherosclerosis. No aneurysm. Extensive coronary vascular calcification. Mild cardiomegaly. No pericardial effusion Mediastinum/Nodes: Patent trachea. Slight tracheal deviation to the left from tortuous vessels. Mild air distension of the esophagus. Subcentimeter mediastinal lymph nodes. Esophagus within normal limits Lungs/Pleura: Chronic elevation of right diaphragm. Small right greater than left pleural effusions. Consolidation with air bronchograms in the right lower lobe slightly progressive compared to the lung bases on prior CT. Heterogeneous consolidation and ground-glass density in the right upper lobe. Upper Abdomen: No acute finding Musculoskeletal: No acute or suspicious osseous abnormality IMPRESSION: 1. Chronic elevation of right diaphragm. 2. Small right greater than left pleural effusions. 3. Consolidation with air bronchograms in the right lower lobe, slightly progressive compared to the lung bases on prior CT. Heterogeneous consolidation and ground-glass density in the right upper lobe, findings are suspicious for pneumonia. 4. Aortic atherosclerosis. Aortic Atherosclerosis (ICD10-I70.0). Electronically Signed   By: Jasmine Pang  M.D.   On: 11/17/2022 19:36   CT Head Wo Contrast  Result Date: 11/02/2022 CLINICAL DATA:  Mental status change, unknown cause; Polytrauma, blunt EXAM: CT HEAD WITHOUT CONTRAST CT CERVICAL SPINE WITHOUT CONTRAST TECHNIQUE: Multidetector CT imaging of the head and cervical spine was performed following the standard protocol without intravenous contrast. Multiplanar CT image reconstructions of the cervical spine were also generated. RADIATION DOSE REDUCTION: This

## 2022-11-10 NOTE — Progress Notes (Signed)
Pt transported from ED09 to 5W23 on [facility] with no complication

## 2022-11-10 NOTE — Consult Note (Signed)
NEUROLOGY CONSULTATION  Reason for Consult: Parkinson's management, confusion   CC: Altered mental status  HPI   [name] is a 87 y.o. male with a past medical history of Parkinson's disease (follows with Dr. Arbutus Leas), pulmonary hypertension, dilated cardiomyopathy, CAD, chronic kidney disease who initially presented on December 06, 2022 with altered mental status.  Family reported that he has been altered with increased confusion and hallucinations for the last week and has been apparently only sitting in his recliner.  When he was found by EMS he was naked and covered in urine and stool.   Has had a decline in swallowing over the past month with a lot of coughing after eating, and increased wheezing over the past week.   Parkinson's: Follows with Dr. Arbutus Leas at [facility] neurology since 2018.  He is currently on carbidopa-levodopa 25/100 two tablets 3 times daily with instructions to take an 1/2 to full tablet as needed (Dr. Arbutus Leas has documented she does not think increased dose will help symptoms at this time; wife reports they never use the extra as-needed pills).  He saw her on 10/06/2022 and it was noted that he had become significantly deconditioned which had been previously documented as well as patient/family declining additional physical therapy inpatient and outpatient.  It was also noted that he has significant lymphedema in his legs and was no longer ambulating.  He takes gabapentin 300 mg three times a day for paresthesias in his feet.  At this visit they discussed starting Nuplazid which was ultimately started 11/02/2022. He received a total of 6 doses on Nuplazid.  Family reports that since starting Nuplazid he has had worsening confusion and hallucinations.  They stopped the medication on 9/10.  On arrival to the ED he was hypothermic with a temperature of 95.1 and was reportedly hypotensive.  Code sepsis was initiated and he was started on vancomycin 1500 mg, cefepime 2 g IV, and Flagyl 500 mg.   Antibiotics have since been de-escalated and he is currently only on Unasyn 3 g twice daily.  Per hospitalist assistance this morning he was obtunded on BiPAP with 100% FiO2 and is suspected to be in a hypoxic respiratory failure.  He does now respond to verbal stimuli, intermittently following commands.  Not much verbal output, however he is on continuous BiPAP at this time.  Sinemet has been ordered inpatient however he is currently NPO and has not received any doses. Last dose of sinemet on Tuesday evening. Received influenza vaccine and RSV vaccine on Tuesday.  Nuplazid is currently on hold.   PCCM and neurology consulted.   History is obtained from: Chart review, no family present.   ROS: Review of systems not obtained due to patient factors.   Past Medical History:  Past Medical History:  Diagnosis Date   Arthritis    "knees; lower back" (08/09/2016)   Atrial fibrillation (HCC)    remote hx/notes 08/07/2016   Coronary artery disease    Episodes of trembling    "most of the time it's my left hand" (08/09/2016)   GI bleeding 08/2017   High cholesterol    History of kidney stones    Hypertension    Melanoma of shoulder (HCC) 2000s   "left"   Mitral regurgitation and mitral stenosis    hx/notes 08/07/2016   Moderate aortic stenosis    hx/notes 08/07/2016   Parkinson's disease    Prostate cancer (HCC) 2011   hx/notes 08/07/2016    Family History Family History  Problem Relation Age  of Onset   Heart attack Father    Cerebral aneurysm Sister    Healthy Daughter     Allergies:  Allergies  Allergen Reactions   Nsaids Other (See Comments)    Told to avoid due to Eliquis use   Nuplazid [Pimavanserin Tartrate] Other (See Comments)    Increased hallucinations    Social History:   reports that he has never smoked. He has never used smokeless tobacco. He reports current alcohol use of about 14.0 standard drinks of alcohol per week. He reports that he does not use drugs.     Medications Medications Prior to Admission  Medication Sig Dispense Refill   acetaminophen (TYLENOL) 325 MG tablet Take 1-2 tablets (325-650 mg total) by mouth every 6 (six) hours as needed for mild pain (pain score 1-3 or temp > 100.5). 100 tablet 0   apixaban (ELIQUIS) 2.5 MG TABS tablet Take 1 tablet (2.5 mg total) by mouth 2 (two) times daily. (0800 & 2000) (Patient taking differently: Take 2.5 mg by mouth 2 (two) times daily.) 180 tablet 5   carbidopa-levodopa (SINEMET IR) 25-100 MG tablet TAKE 2 TABLETS THREE TIMES DAILY AT 7:00AM, 11:00AM AND 4:00PM CAN TAKE EXTRA 1/2 TO 1 TABLET AS NEEDED 550 tablet 0   Cholecalciferol (VITAMIN D-3) 1000 units CAPS Take 1,000 Units by mouth daily.     diltiazem (CARDIZEM CD) 120 MG 24 hr capsule TAKE 1 CAPSULE EVERY DAY 90 capsule 3   furosemide (LASIX) 40 MG tablet Take 1 tablet (40 mg total) by mouth 2 (two) times daily. 180 tablet 3   gabapentin (NEURONTIN) 300 MG capsule Take 1 capsule (300 mg total) by mouth 3 (three) times daily. 270 capsule 1   metolazone (ZAROXOLYN) 2.5 MG tablet Take 1 tablet (2.5 mg total) by mouth as directed. M, W, F (Patient taking differently: Take 2.5 mg by mouth every Monday, Wednesday, and Friday.) 90 tablet 3   Multiple Vitamins-Minerals (PRESERVISION AREDS PO) Take 1 tablet by mouth in the morning and at bedtime.     nitroGLYCERIN (NITROSTAT) 0.4 MG SL tablet TAKE 1 TABLET EVERY 5 MINUTES AS NEEDED FOR CHEST PAIN (Patient taking differently: Place 0.4 mg under the tongue every 5 (five) minutes as needed for chest pain.) 25 tablet 0   potassium chloride SA (KLOR-CON M) 20 MEQ tablet Take 40 mEq by mouth 3 (three) times daily.     rosuvastatin (CRESTOR) 10 MG tablet Take 10 mg by mouth every evening.      EXAMINATION  Current vital signs:    11/10/2022    8:08 AM 11/10/2022    6:11 AM 11/10/2022    4:07 AM  Vitals with BMI  Systolic  134 154  Diastolic  64 80  Pulse 71 72 75    Examination:  GENERAL:  Lethargic HEENT: - Normocephalic and atraumatic, dry mm, no lymphadenopathy, no Thyromegally LUNGS - Coarse, on bipap CV - Afib on the monitor, RRR, equal pulses bilaterally. ABDOMEN - Soft, nontender, nondistended with normoactive BS Ext: warm, well perfused, intact peripheral pulses, 3+ edema, erythema in bilateral lower extremity  NEURO:  Mental Status: Reacts to noxious stimuli, does not answer orientation questions. Inconsistently follow commands Language: Sparse verbal output. Bipap in place. Only states "what" and "stop" during exam Cranial Nerves:  II: PERRL 2-23mm III, IV, VI: Grimaces with forced eye opening, eyes midline VII: no facial asymmetry   VIII: hearing intact to voice IX, X: Cough intact WU:XLKG is midline XII: tongue is midline without  fasciculations. Motor:  RUE and LUE: grip  5/5, moves bilateral upper extremities antigravity RLE and LLE: resists passive ROM.  Tone: is increased, myoclonic jerks intermittently Sensation- withdraws to painful stimuli Coordination: tremor noted with movement  Gait- deferred   LABS  I have reviewed labs in epic and the results pertinent to this consultation are:   Basic Metabolic Panel: Recent Labs  Lab 11/30/2022 1300 11-30-2022 1316 11/10/22 0045  NA 142 142 143  K 4.6 4.5 4.2  CL 97* 99 99  CO2 31  --  29  GLUCOSE 80 81 82  BUN 54* 57* 50*  CREATININE 2.52* 2.60* 2.56*  CALCIUM 9.5  --  9.0   Baseline Cr 1.62, basline BUN 20-45  CBC: Recent Labs  Lab November 30, 2022 1300 11-30-2022 1316 11/10/22 0045  WBC 4.3  --  4.3  NEUTROABS 3.6  --   --   HGB 12.2* 13.6 11.3*  HCT 40.9 40.0 38.3*  MCV 93.0  --  92.5  PLT 109*  --  115*    Coagulation Studies: Recent Labs    11/30/2022 1300  LABPROT 18.7*  INR 1.5*    Uric acid 9.2 (elevated) 3.7 - 8.6  Alk Phos 9.2   ABG ph 7.45 / CO2 52 / Bicarb 36.1 / acid-base excess 10.2   DIAGNOSTIC IMAGING/PROCEDURES  I have reviewed the images obtained:, as below     CT-head- No evidence of acute infarction, hemorrhage, hydrocephalus, extra-axial collection or mass lesion/mass effect. Sequela of moderate chronic microvascular ischemic change.   Assessment:  87 y.o. male with a pertinent medical history of Parkinson's disease (follows with Dr. Arbutus Leas), pulmonary hypertension, dilated cardiomyopathy, CAD, chronic kidney disease who initially presented on 2022/11/30 with altered mental status.  Family noticed an increase in hallucinations and confusion after nuplazid was started on 9/4.  On review of the medication it is noted that common adverse reactions included (incidence >=5% and at least twice the rate of placebo): peripheral edema (7% NUPLAZID 34 mg vs. 2% placebo) and confusional state (6% NUPLAZID 34 mg vs. 3% placebo). However he does have other medical conditions that can also contribute to edema (cardiac and renal function, lymphedema)   He is currently requiring continuous BiPAP at 100% FiO2.  Additionally CCM was consulted due to respiratory status as well. Unable to get a hold of family by phone.    Parkinsonian symptoms can worsen in the setting of acute illness but treatment should focus on acute illness; would not change sinemet dose at this time (increased dose can also worsen hallucinations and orthostatic hypotension). Unfortunately he cannot tolerate PO and feeding tube is not within goals of care. He will be at risk of NMS  Nuplazid typically takes ~6 weeks to take effect; other medical conditions could be contributing to patient's worsening hallucination (delirium). However, okay to stop this medication for now, pending follow-up outpatient with his movement specialist after discharge. Suspect he may have been developing worsening hallucinations due to ongoing aspiration (new medical problems can easily cause worsening in patients with dementia). Family feels strongly about stopping Nuplazid however, and this is reasonable.    Impression: Encephalopathy and myoclonus multifactorial due to the below medical conditions including: Acute respiratory failure on BiPAP, hypercarbic on ABG Potential cefepime neurotoxicity AKI on CKD w/ uremia Concern for aspiration pneumonia Multifactorial LE edema (heart failure, CKD, lymphedema, inactivity, potentially contribution of Nuplazid) Baseline Parkinson's dementia with hallucinations  Recommendations: - Continue sinemet when able to swallow; wife notes feeding tube is  not within goals of care  - Okay to hold off on nuplazid inpatient - B12, B1, folate, to rule out treatable nutritional contributers, please treat as needed Goal B12 > 400  - EEG as cefepime can lower seizure threshold - Agree with goals of care discussion - Inpatient neurology will follow up EEG but otherwise will sign off please reach out any questions or concerns --  Seen by NP Elmer Picker and myself  Attending Neurologist's note:  I personally saw this patient, gathering history, performing a full neurologic examination, reviewing relevant labs, personally reviewing relevant imaging including head CT, and formulated the assessment and plan, adding the note above for completeness and clarity to accurately reflect my thoughts  **This documentation was dictated using Dragon Medical Software and may contain inadvertent errors **   Brooke Dare MD-PhD [facility] [phone]  Available 7 AM to 7 PM, outside these hours please contact Neurologist on call listed on AMION

## 2022-11-10 NOTE — Progress Notes (Signed)
PT Cancellation Note  Patient Details Name: [name] MRN: [id number] DOB: Feb 04, 1936   Cancelled Treatment:    Reason Eval/Treat Not Completed: Patient not medically ready  Patient currently on BiPAP. Will attempt to see if able to wean from BiPAP and appears able to tolerate incr activity.   Jerolyn Center, PT Acute Rehabilitation Services  Office [phone]   Zena Amos 11/10/2022, 10:08 AM

## 2022-11-10 NOTE — Evaluation (Signed)
SLP Cancellation Note  Patient Details Name: [name] MRN: [id number] DOB: 1935/03/06   Cancelled treatment:       Reason Eval/Treat Not Completed: Other (comment) (SLP reached out to RN, Dorene Grebe re: swallow eval; she requests SLP see pt in pm as he is currently asleep; will continue efforts)   Chales Abrahams 11/10/2022, 7:54 AM

## 2022-11-10 NOTE — Procedures (Signed)
Patient Name: [name]  MRN: [id number]  Epilepsy Attending: Charlsie Quest  Referring Physician/Provider: Leroy Sea, MD  Date: 11/10/2022 Duration: 26.03 mins  Patient history: 87yo m with ams getting eeg to evaluate for seizure  Level of alertness: Awake, asleep  AEDs during EEG study: Ativan  Technical aspects: This EEG study was done with scalp electrodes positioned according to the 10-20 International system of electrode placement. Electrical activity was reviewed with band pass filter of 1-70Hz , sensitivity of 7 uV/mm, display speed of 33mm/sec with a 60Hz  notched filter applied as appropriate. EEG data were recorded continuously and digitally stored.  Video monitoring was available and reviewed as appropriate.  Description: No posterior dominant rhythm was seen. Sleep was characterized by  sleep spindles (12 to 14 Hz), maximal frontocentral region. EEG showed continuous generalized 3 to 6 Hz theta-delta slowing. Hyperventilation and photic stimulation were not performed.     ABNORMALITY - Continuous slow, generalized  IMPRESSION: This study is suggestive of moderate diffuse encephalopathy. No seizures or epileptiform discharges were seen throughout the recording.  Deazia Lampi Annabelle Harman

## 2022-11-10 NOTE — Progress Notes (Signed)
Pharmacy Antibiotic Note  [name] is a 87 y.o. male admitted on 11/06/2022 presenting with AMS, concern for sepsis due to pneumonia. Pharmacy has been consulted for Unasyn dosing.  Vancomycin 1500 mg IV given in ED  Plan: Unasyn 3g IV Q12h  Trend WBC, fever, renal function F/u cultures, clinical progress, levels as indicated De-escalate when able  Height: 5\' 10"  (177.8 cm) Weight: 87.5 kg (192 lb 14.4 oz) IBW/kg (Calculated) : 73  Temp (24hrs), Avg:97.4 F (36.3 C), Min:95.1 F (35.1 C), Max:98.5 F (36.9 C)  Recent Labs  Lab 11/15/2022 1300 11/27/2022 1311 11/12/2022 1316 11/11/2022 1545 11/10/22 0045  WBC 4.3  --   --   --  4.3  CREATININE 2.52*  --  2.60*  --  2.56*  LATICACIDVEN  --  1.2  --  1.4  --     Estimated Creatinine Clearance: 21.8 mL/min (A) (by C-G formula based on SCr of 2.56 mg/dL (H)).    Allergies  Allergen Reactions   Nsaids Other (See Comments)    Told to avoid due to Eliquis use   Nuplazid [Pimavanserin Tartrate] Other (See Comments)    Increased hallucinations    Thank you for allowing pharmacy to be a part of this patient's care.  Thelma Barge, PharmD Clinical Pharmacist

## 2022-11-10 NOTE — Progress Notes (Signed)
EEG complete - results pending 

## 2022-11-10 NOTE — Consult Note (Signed)
and/or use of iterative reconstruction technique. COMPARISON:  Chest x-ray 11/20/2022, 06/21/2022, CT lung bases 09/01/2017 FINDINGS: Cardiovascular: Limited without intravenous contrast. Advanced aortic atherosclerosis. No aneurysm. Extensive coronary vascular calcification. Mild cardiomegaly. No pericardial effusion Mediastinum/Nodes: Patent trachea. Slight tracheal deviation to the left from tortuous vessels. Mild air distension of the esophagus. Subcentimeter mediastinal lymph nodes. Esophagus within normal limits Lungs/Pleura: Chronic elevation of right diaphragm. Small right greater than left pleural effusions. Consolidation with air bronchograms in the right lower lobe slightly progressive compared to the lung bases on prior CT. Heterogeneous consolidation and ground-glass density in the right upper lobe. Upper Abdomen: No acute finding Musculoskeletal: No acute or suspicious osseous abnormality IMPRESSION: 1. Chronic elevation of right diaphragm. 2. Small right greater than left pleural effusions. 3. Consolidation with air bronchograms in the right lower lobe, slightly progressive  compared to the lung bases on prior CT. Heterogeneous consolidation and ground-glass density in the right upper lobe, findings are suspicious for pneumonia. 4. Aortic atherosclerosis. Aortic Atherosclerosis (ICD10-I70.0). Electronically Signed   By: Jasmine Pang M.D.   On: 11/19/2022 19:36   CT Head Wo Contrast  Result Date: 11/17/2022 CLINICAL DATA:  Mental status change, unknown cause; Polytrauma, blunt EXAM: CT HEAD WITHOUT CONTRAST CT CERVICAL SPINE WITHOUT CONTRAST TECHNIQUE: Multidetector CT imaging of the head and cervical spine was performed following the standard protocol without intravenous contrast. Multiplanar CT image reconstructions of the cervical spine were also generated. RADIATION DOSE REDUCTION: This exam was performed according to the departmental dose-optimization program which includes automated exposure control, adjustment of the mA and/or kV according to patient size and/or use of iterative reconstruction technique. COMPARISON:  None Available. FINDINGS: CT HEAD FINDINGS Brain: No evidence of acute infarction, hemorrhage, hydrocephalus, extra-axial collection or mass lesion/mass effect. Sequela of moderate chronic microvascular ischemic change. Vascular: No hyperdense vessel or unexpected calcification. Skull: Normal. Negative for fracture or focal lesion. Sinuses/Orbits: No middle ear or mastoid effusion. Paranasal sinuses are clear. Bilateral lens replacement. Orbits are otherwise unremarkable. Other: None. CT CERVICAL SPINE FINDINGS Alignment: Grade 1 anterolisthesis of C3 on C4 and C4 on C5. Trace retrolisthesis of C5 on C6. Skull base and vertebrae: No acute fracture. No primary bone lesion or focal pathologic process. Soft tissues and spinal canal: No prevertebral fluid or swelling. No visible canal hematoma. Disc levels:  No evidence of high-grade spinal canal stenosis. Upper chest: Clustered nodularity in the right upper lobe is worrisome for an underlying infectious or  inflammatory process. There is a small right-sided pleural effusion. Recommend further evaluation with a dedicated chest CT for more definitive characterization. Other: None IMPRESSION: 1. No acute intracranial abnormality. 2. No acute fracture or traumatic malalignment of the cervical spine. 3. Clustered nodularity in the right upper lobe is worrisome for an underlying infectious or inflammatory process. There is a small right-sided pleural effusion. Recommend further evaluation with a dedicated chest CT for more definitive characterization. Electronically Signed   By: Lorenza Cambridge M.D.   On: 11/15/2022 15:10   CT Cervical Spine Wo Contrast  Result Date: 11/05/2022 CLINICAL DATA:  Mental status change, unknown cause; Polytrauma, blunt EXAM: CT HEAD WITHOUT CONTRAST CT CERVICAL SPINE WITHOUT CONTRAST TECHNIQUE: Multidetector CT imaging of the head and cervical spine was performed following the standard protocol without intravenous contrast. Multiplanar CT image reconstructions of the cervical spine were also generated. RADIATION DOSE REDUCTION: This exam was performed according to the departmental dose-optimization program which includes automated exposure control, adjustment of the mA and/or kV according to  0.26 - 0.50 ng/mL               AND        <80% decrease in PCT             Encourage initiation of                                             antibiotics       Encourage continuation           of antibiotics    ----------------------------     -----------------------------        PCT >= 0.50 ng/mL                  PCT > 0.50 ng/mL               AND         increase in PCT                  Strongly encourage                                      initiation of antibiotics    Strongly encourage escalation           of antibiotics                                     -----------------------------                                           PCT <= 0.25 ng/mL                                                 OR                                        > 80% decrease in PCT                                      Discontinue / Do not initiate                                             antibiotics  Performed at [facility], [address], [place], [place] [address]   I-Stat Lactic Acid, ED     Status: None   Collection Time: 11/01/2022  3:45 PM  Result Value Ref Range   Lactic Acid, Venous 1.4 0.5 - 1.9 mmol/L  CBC     Status: Abnormal   Collection Time: 11/10/22 12:45 AM  Result Value Ref Range   WBC 4.3 4.0 - 10.5 K/uL   RBC 4.14 (L) 4.22 -  0.26 - 0.50 ng/mL               AND        <80% decrease in PCT             Encourage initiation of                                             antibiotics       Encourage continuation           of antibiotics    ----------------------------     -----------------------------        PCT >= 0.50 ng/mL                  PCT > 0.50 ng/mL               AND         increase in PCT                  Strongly encourage                                      initiation of antibiotics    Strongly encourage escalation           of antibiotics                                     -----------------------------                                           PCT <= 0.25 ng/mL                                                 OR                                        > 80% decrease in PCT                                      Discontinue / Do not initiate                                             antibiotics  Performed at [facility], [address], [place], [place] [address]   I-Stat Lactic Acid, ED     Status: None   Collection Time: 11/01/2022  3:45 PM  Result Value Ref Range   Lactic Acid, Venous 1.4 0.5 - 1.9 mmol/L  CBC     Status: Abnormal   Collection Time: 11/10/22 12:45 AM  Result Value Ref Range   WBC 4.3 4.0 - 10.5 K/uL   RBC 4.14 (L) 4.22 -  UA 150 (A) NEGATIVE mg/dL   Hgb urine dipstick NEGATIVE NEGATIVE   Bilirubin Urine NEGATIVE NEGATIVE   Ketones, ur NEGATIVE NEGATIVE mg/dL   Protein, ur NEGATIVE NEGATIVE mg/dL   Nitrite NEGATIVE NEGATIVE   Leukocytes,Ua NEGATIVE NEGATIVE   RBC / HPF 0-5 0 - 5 RBC/hpf   WBC, UA 0-5 0 - 5 WBC/hpf    Comment:        Reflex urine culture not performed if WBC <=10, OR if Squamous epithelial cells >5. If Squamous epithelial cells >5 suggest recollection.    Bacteria, UA RARE (A) NONE SEEN   Squamous Epithelial / HPF 0-5 0 - 5 /HPF   Mucus PRESENT     Comment: Performed at [facility], [address], [place], [place] [address]  Troponin I (High Sensitivity)     Status: Abnormal   Collection Time: 11/21/2022  3:28 PM  Result Value Ref Range   Troponin I (High Sensitivity) 24 (H) <18 ng/L    Comment: (NOTE) Elevated high sensitivity troponin I (hsTnI) values and significant  changes across serial measurements may suggest ACS but many other  chronic and acute conditions are known to elevate hsTnI results.  Refer to the "Links" section for chest pain algorithms and additional  guidance. Performed at [facility], [address] [place], [place] [address]   Procalcitonin     Status: None   Collection Time: 11/10/2022  3:28 PM  Result Value Ref Range   Procalcitonin <0.10 ng/mL    Comment:        Interpretation: PCT (Procalcitonin) <= 0.5 ng/mL: Systemic infection (sepsis) is not likely. Local bacterial infection is possible. (NOTE)       Sepsis PCT Algorithm           Lower Respiratory Tract                                      Infection PCT Algorithm    ----------------------------     ----------------------------         PCT < 0.25 ng/mL                PCT < 0.10 ng/mL           Strongly encourage             Strongly discourage   discontinuation of antibiotics    initiation of antibiotics    ----------------------------     -----------------------------       PCT 0.25 - 0.50 ng/mL            PCT 0.10 - 0.25 ng/mL               OR       >80% decrease in PCT            Discourage initiation of                                            antibiotics      Encourage discontinuation           of antibiotics    ----------------------------     -----------------------------         PCT >= 0.50 ng/mL              PCT  and/or use of iterative reconstruction technique. COMPARISON:  Chest x-ray 11/20/2022, 06/21/2022, CT lung bases 09/01/2017 FINDINGS: Cardiovascular: Limited without intravenous contrast. Advanced aortic atherosclerosis. No aneurysm. Extensive coronary vascular calcification. Mild cardiomegaly. No pericardial effusion Mediastinum/Nodes: Patent trachea. Slight tracheal deviation to the left from tortuous vessels. Mild air distension of the esophagus. Subcentimeter mediastinal lymph nodes. Esophagus within normal limits Lungs/Pleura: Chronic elevation of right diaphragm. Small right greater than left pleural effusions. Consolidation with air bronchograms in the right lower lobe slightly progressive compared to the lung bases on prior CT. Heterogeneous consolidation and ground-glass density in the right upper lobe. Upper Abdomen: No acute finding Musculoskeletal: No acute or suspicious osseous abnormality IMPRESSION: 1. Chronic elevation of right diaphragm. 2. Small right greater than left pleural effusions. 3. Consolidation with air bronchograms in the right lower lobe, slightly progressive  compared to the lung bases on prior CT. Heterogeneous consolidation and ground-glass density in the right upper lobe, findings are suspicious for pneumonia. 4. Aortic atherosclerosis. Aortic Atherosclerosis (ICD10-I70.0). Electronically Signed   By: Jasmine Pang M.D.   On: 11/19/2022 19:36   CT Head Wo Contrast  Result Date: 11/17/2022 CLINICAL DATA:  Mental status change, unknown cause; Polytrauma, blunt EXAM: CT HEAD WITHOUT CONTRAST CT CERVICAL SPINE WITHOUT CONTRAST TECHNIQUE: Multidetector CT imaging of the head and cervical spine was performed following the standard protocol without intravenous contrast. Multiplanar CT image reconstructions of the cervical spine were also generated. RADIATION DOSE REDUCTION: This exam was performed according to the departmental dose-optimization program which includes automated exposure control, adjustment of the mA and/or kV according to patient size and/or use of iterative reconstruction technique. COMPARISON:  None Available. FINDINGS: CT HEAD FINDINGS Brain: No evidence of acute infarction, hemorrhage, hydrocephalus, extra-axial collection or mass lesion/mass effect. Sequela of moderate chronic microvascular ischemic change. Vascular: No hyperdense vessel or unexpected calcification. Skull: Normal. Negative for fracture or focal lesion. Sinuses/Orbits: No middle ear or mastoid effusion. Paranasal sinuses are clear. Bilateral lens replacement. Orbits are otherwise unremarkable. Other: None. CT CERVICAL SPINE FINDINGS Alignment: Grade 1 anterolisthesis of C3 on C4 and C4 on C5. Trace retrolisthesis of C5 on C6. Skull base and vertebrae: No acute fracture. No primary bone lesion or focal pathologic process. Soft tissues and spinal canal: No prevertebral fluid or swelling. No visible canal hematoma. Disc levels:  No evidence of high-grade spinal canal stenosis. Upper chest: Clustered nodularity in the right upper lobe is worrisome for an underlying infectious or  inflammatory process. There is a small right-sided pleural effusion. Recommend further evaluation with a dedicated chest CT for more definitive characterization. Other: None IMPRESSION: 1. No acute intracranial abnormality. 2. No acute fracture or traumatic malalignment of the cervical spine. 3. Clustered nodularity in the right upper lobe is worrisome for an underlying infectious or inflammatory process. There is a small right-sided pleural effusion. Recommend further evaluation with a dedicated chest CT for more definitive characterization. Electronically Signed   By: Lorenza Cambridge M.D.   On: 11/15/2022 15:10   CT Cervical Spine Wo Contrast  Result Date: 11/05/2022 CLINICAL DATA:  Mental status change, unknown cause; Polytrauma, blunt EXAM: CT HEAD WITHOUT CONTRAST CT CERVICAL SPINE WITHOUT CONTRAST TECHNIQUE: Multidetector CT imaging of the head and cervical spine was performed following the standard protocol without intravenous contrast. Multiplanar CT image reconstructions of the cervical spine were also generated. RADIATION DOSE REDUCTION: This exam was performed according to the departmental dose-optimization program which includes automated exposure control, adjustment of the mA and/or kV according to  and/or use of iterative reconstruction technique. COMPARISON:  Chest x-ray 11/20/2022, 06/21/2022, CT lung bases 09/01/2017 FINDINGS: Cardiovascular: Limited without intravenous contrast. Advanced aortic atherosclerosis. No aneurysm. Extensive coronary vascular calcification. Mild cardiomegaly. No pericardial effusion Mediastinum/Nodes: Patent trachea. Slight tracheal deviation to the left from tortuous vessels. Mild air distension of the esophagus. Subcentimeter mediastinal lymph nodes. Esophagus within normal limits Lungs/Pleura: Chronic elevation of right diaphragm. Small right greater than left pleural effusions. Consolidation with air bronchograms in the right lower lobe slightly progressive compared to the lung bases on prior CT. Heterogeneous consolidation and ground-glass density in the right upper lobe. Upper Abdomen: No acute finding Musculoskeletal: No acute or suspicious osseous abnormality IMPRESSION: 1. Chronic elevation of right diaphragm. 2. Small right greater than left pleural effusions. 3. Consolidation with air bronchograms in the right lower lobe, slightly progressive  compared to the lung bases on prior CT. Heterogeneous consolidation and ground-glass density in the right upper lobe, findings are suspicious for pneumonia. 4. Aortic atherosclerosis. Aortic Atherosclerosis (ICD10-I70.0). Electronically Signed   By: Jasmine Pang M.D.   On: 11/19/2022 19:36   CT Head Wo Contrast  Result Date: 11/17/2022 CLINICAL DATA:  Mental status change, unknown cause; Polytrauma, blunt EXAM: CT HEAD WITHOUT CONTRAST CT CERVICAL SPINE WITHOUT CONTRAST TECHNIQUE: Multidetector CT imaging of the head and cervical spine was performed following the standard protocol without intravenous contrast. Multiplanar CT image reconstructions of the cervical spine were also generated. RADIATION DOSE REDUCTION: This exam was performed according to the departmental dose-optimization program which includes automated exposure control, adjustment of the mA and/or kV according to patient size and/or use of iterative reconstruction technique. COMPARISON:  None Available. FINDINGS: CT HEAD FINDINGS Brain: No evidence of acute infarction, hemorrhage, hydrocephalus, extra-axial collection or mass lesion/mass effect. Sequela of moderate chronic microvascular ischemic change. Vascular: No hyperdense vessel or unexpected calcification. Skull: Normal. Negative for fracture or focal lesion. Sinuses/Orbits: No middle ear or mastoid effusion. Paranasal sinuses are clear. Bilateral lens replacement. Orbits are otherwise unremarkable. Other: None. CT CERVICAL SPINE FINDINGS Alignment: Grade 1 anterolisthesis of C3 on C4 and C4 on C5. Trace retrolisthesis of C5 on C6. Skull base and vertebrae: No acute fracture. No primary bone lesion or focal pathologic process. Soft tissues and spinal canal: No prevertebral fluid or swelling. No visible canal hematoma. Disc levels:  No evidence of high-grade spinal canal stenosis. Upper chest: Clustered nodularity in the right upper lobe is worrisome for an underlying infectious or  inflammatory process. There is a small right-sided pleural effusion. Recommend further evaluation with a dedicated chest CT for more definitive characterization. Other: None IMPRESSION: 1. No acute intracranial abnormality. 2. No acute fracture or traumatic malalignment of the cervical spine. 3. Clustered nodularity in the right upper lobe is worrisome for an underlying infectious or inflammatory process. There is a small right-sided pleural effusion. Recommend further evaluation with a dedicated chest CT for more definitive characterization. Electronically Signed   By: Lorenza Cambridge M.D.   On: 11/15/2022 15:10   CT Cervical Spine Wo Contrast  Result Date: 11/05/2022 CLINICAL DATA:  Mental status change, unknown cause; Polytrauma, blunt EXAM: CT HEAD WITHOUT CONTRAST CT CERVICAL SPINE WITHOUT CONTRAST TECHNIQUE: Multidetector CT imaging of the head and cervical spine was performed following the standard protocol without intravenous contrast. Multiplanar CT image reconstructions of the cervical spine were also generated. RADIATION DOSE REDUCTION: This exam was performed according to the departmental dose-optimization program which includes automated exposure control, adjustment of the mA and/or kV according to  0.26 - 0.50 ng/mL               AND        <80% decrease in PCT             Encourage initiation of                                             antibiotics       Encourage continuation           of antibiotics    ----------------------------     -----------------------------        PCT >= 0.50 ng/mL                  PCT > 0.50 ng/mL               AND         increase in PCT                  Strongly encourage                                      initiation of antibiotics    Strongly encourage escalation           of antibiotics                                     -----------------------------                                           PCT <= 0.25 ng/mL                                                 OR                                        > 80% decrease in PCT                                      Discontinue / Do not initiate                                             antibiotics  Performed at [facility], [address], [place], [place] [address]   I-Stat Lactic Acid, ED     Status: None   Collection Time: 11/01/2022  3:45 PM  Result Value Ref Range   Lactic Acid, Venous 1.4 0.5 - 1.9 mmol/L  CBC     Status: Abnormal   Collection Time: 11/10/22 12:45 AM  Result Value Ref Range   WBC 4.3 4.0 - 10.5 K/uL   RBC 4.14 (L) 4.22 -  and/or use of iterative reconstruction technique. COMPARISON:  Chest x-ray 11/20/2022, 06/21/2022, CT lung bases 09/01/2017 FINDINGS: Cardiovascular: Limited without intravenous contrast. Advanced aortic atherosclerosis. No aneurysm. Extensive coronary vascular calcification. Mild cardiomegaly. No pericardial effusion Mediastinum/Nodes: Patent trachea. Slight tracheal deviation to the left from tortuous vessels. Mild air distension of the esophagus. Subcentimeter mediastinal lymph nodes. Esophagus within normal limits Lungs/Pleura: Chronic elevation of right diaphragm. Small right greater than left pleural effusions. Consolidation with air bronchograms in the right lower lobe slightly progressive compared to the lung bases on prior CT. Heterogeneous consolidation and ground-glass density in the right upper lobe. Upper Abdomen: No acute finding Musculoskeletal: No acute or suspicious osseous abnormality IMPRESSION: 1. Chronic elevation of right diaphragm. 2. Small right greater than left pleural effusions. 3. Consolidation with air bronchograms in the right lower lobe, slightly progressive  compared to the lung bases on prior CT. Heterogeneous consolidation and ground-glass density in the right upper lobe, findings are suspicious for pneumonia. 4. Aortic atherosclerosis. Aortic Atherosclerosis (ICD10-I70.0). Electronically Signed   By: Jasmine Pang M.D.   On: 11/19/2022 19:36   CT Head Wo Contrast  Result Date: 11/17/2022 CLINICAL DATA:  Mental status change, unknown cause; Polytrauma, blunt EXAM: CT HEAD WITHOUT CONTRAST CT CERVICAL SPINE WITHOUT CONTRAST TECHNIQUE: Multidetector CT imaging of the head and cervical spine was performed following the standard protocol without intravenous contrast. Multiplanar CT image reconstructions of the cervical spine were also generated. RADIATION DOSE REDUCTION: This exam was performed according to the departmental dose-optimization program which includes automated exposure control, adjustment of the mA and/or kV according to patient size and/or use of iterative reconstruction technique. COMPARISON:  None Available. FINDINGS: CT HEAD FINDINGS Brain: No evidence of acute infarction, hemorrhage, hydrocephalus, extra-axial collection or mass lesion/mass effect. Sequela of moderate chronic microvascular ischemic change. Vascular: No hyperdense vessel or unexpected calcification. Skull: Normal. Negative for fracture or focal lesion. Sinuses/Orbits: No middle ear or mastoid effusion. Paranasal sinuses are clear. Bilateral lens replacement. Orbits are otherwise unremarkable. Other: None. CT CERVICAL SPINE FINDINGS Alignment: Grade 1 anterolisthesis of C3 on C4 and C4 on C5. Trace retrolisthesis of C5 on C6. Skull base and vertebrae: No acute fracture. No primary bone lesion or focal pathologic process. Soft tissues and spinal canal: No prevertebral fluid or swelling. No visible canal hematoma. Disc levels:  No evidence of high-grade spinal canal stenosis. Upper chest: Clustered nodularity in the right upper lobe is worrisome for an underlying infectious or  inflammatory process. There is a small right-sided pleural effusion. Recommend further evaluation with a dedicated chest CT for more definitive characterization. Other: None IMPRESSION: 1. No acute intracranial abnormality. 2. No acute fracture or traumatic malalignment of the cervical spine. 3. Clustered nodularity in the right upper lobe is worrisome for an underlying infectious or inflammatory process. There is a small right-sided pleural effusion. Recommend further evaluation with a dedicated chest CT for more definitive characterization. Electronically Signed   By: Lorenza Cambridge M.D.   On: 11/15/2022 15:10   CT Cervical Spine Wo Contrast  Result Date: 11/05/2022 CLINICAL DATA:  Mental status change, unknown cause; Polytrauma, blunt EXAM: CT HEAD WITHOUT CONTRAST CT CERVICAL SPINE WITHOUT CONTRAST TECHNIQUE: Multidetector CT imaging of the head and cervical spine was performed following the standard protocol without intravenous contrast. Multiplanar CT image reconstructions of the cervical spine were also generated. RADIATION DOSE REDUCTION: This exam was performed according to the departmental dose-optimization program which includes automated exposure control, adjustment of the mA and/or kV according to  and/or use of iterative reconstruction technique. COMPARISON:  Chest x-ray 11/20/2022, 06/21/2022, CT lung bases 09/01/2017 FINDINGS: Cardiovascular: Limited without intravenous contrast. Advanced aortic atherosclerosis. No aneurysm. Extensive coronary vascular calcification. Mild cardiomegaly. No pericardial effusion Mediastinum/Nodes: Patent trachea. Slight tracheal deviation to the left from tortuous vessels. Mild air distension of the esophagus. Subcentimeter mediastinal lymph nodes. Esophagus within normal limits Lungs/Pleura: Chronic elevation of right diaphragm. Small right greater than left pleural effusions. Consolidation with air bronchograms in the right lower lobe slightly progressive compared to the lung bases on prior CT. Heterogeneous consolidation and ground-glass density in the right upper lobe. Upper Abdomen: No acute finding Musculoskeletal: No acute or suspicious osseous abnormality IMPRESSION: 1. Chronic elevation of right diaphragm. 2. Small right greater than left pleural effusions. 3. Consolidation with air bronchograms in the right lower lobe, slightly progressive  compared to the lung bases on prior CT. Heterogeneous consolidation and ground-glass density in the right upper lobe, findings are suspicious for pneumonia. 4. Aortic atherosclerosis. Aortic Atherosclerosis (ICD10-I70.0). Electronically Signed   By: Jasmine Pang M.D.   On: 11/19/2022 19:36   CT Head Wo Contrast  Result Date: 11/17/2022 CLINICAL DATA:  Mental status change, unknown cause; Polytrauma, blunt EXAM: CT HEAD WITHOUT CONTRAST CT CERVICAL SPINE WITHOUT CONTRAST TECHNIQUE: Multidetector CT imaging of the head and cervical spine was performed following the standard protocol without intravenous contrast. Multiplanar CT image reconstructions of the cervical spine were also generated. RADIATION DOSE REDUCTION: This exam was performed according to the departmental dose-optimization program which includes automated exposure control, adjustment of the mA and/or kV according to patient size and/or use of iterative reconstruction technique. COMPARISON:  None Available. FINDINGS: CT HEAD FINDINGS Brain: No evidence of acute infarction, hemorrhage, hydrocephalus, extra-axial collection or mass lesion/mass effect. Sequela of moderate chronic microvascular ischemic change. Vascular: No hyperdense vessel or unexpected calcification. Skull: Normal. Negative for fracture or focal lesion. Sinuses/Orbits: No middle ear or mastoid effusion. Paranasal sinuses are clear. Bilateral lens replacement. Orbits are otherwise unremarkable. Other: None. CT CERVICAL SPINE FINDINGS Alignment: Grade 1 anterolisthesis of C3 on C4 and C4 on C5. Trace retrolisthesis of C5 on C6. Skull base and vertebrae: No acute fracture. No primary bone lesion or focal pathologic process. Soft tissues and spinal canal: No prevertebral fluid or swelling. No visible canal hematoma. Disc levels:  No evidence of high-grade spinal canal stenosis. Upper chest: Clustered nodularity in the right upper lobe is worrisome for an underlying infectious or  inflammatory process. There is a small right-sided pleural effusion. Recommend further evaluation with a dedicated chest CT for more definitive characterization. Other: None IMPRESSION: 1. No acute intracranial abnormality. 2. No acute fracture or traumatic malalignment of the cervical spine. 3. Clustered nodularity in the right upper lobe is worrisome for an underlying infectious or inflammatory process. There is a small right-sided pleural effusion. Recommend further evaluation with a dedicated chest CT for more definitive characterization. Electronically Signed   By: Lorenza Cambridge M.D.   On: 11/15/2022 15:10   CT Cervical Spine Wo Contrast  Result Date: 11/05/2022 CLINICAL DATA:  Mental status change, unknown cause; Polytrauma, blunt EXAM: CT HEAD WITHOUT CONTRAST CT CERVICAL SPINE WITHOUT CONTRAST TECHNIQUE: Multidetector CT imaging of the head and cervical spine was performed following the standard protocol without intravenous contrast. Multiplanar CT image reconstructions of the cervical spine were also generated. RADIATION DOSE REDUCTION: This exam was performed according to the departmental dose-optimization program which includes automated exposure control, adjustment of the mA and/or kV according to  0.26 - 0.50 ng/mL               AND        <80% decrease in PCT             Encourage initiation of                                             antibiotics       Encourage continuation           of antibiotics    ----------------------------     -----------------------------        PCT >= 0.50 ng/mL                  PCT > 0.50 ng/mL               AND         increase in PCT                  Strongly encourage                                      initiation of antibiotics    Strongly encourage escalation           of antibiotics                                     -----------------------------                                           PCT <= 0.25 ng/mL                                                 OR                                        > 80% decrease in PCT                                      Discontinue / Do not initiate                                             antibiotics  Performed at [facility], [address], [place], [place] [address]   I-Stat Lactic Acid, ED     Status: None   Collection Time: 11/01/2022  3:45 PM  Result Value Ref Range   Lactic Acid, Venous 1.4 0.5 - 1.9 mmol/L  CBC     Status: Abnormal   Collection Time: 11/10/22 12:45 AM  Result Value Ref Range   WBC 4.3 4.0 - 10.5 K/uL   RBC 4.14 (L) 4.22 -  UA 150 (A) NEGATIVE mg/dL   Hgb urine dipstick NEGATIVE NEGATIVE   Bilirubin Urine NEGATIVE NEGATIVE   Ketones, ur NEGATIVE NEGATIVE mg/dL   Protein, ur NEGATIVE NEGATIVE mg/dL   Nitrite NEGATIVE NEGATIVE   Leukocytes,Ua NEGATIVE NEGATIVE   RBC / HPF 0-5 0 - 5 RBC/hpf   WBC, UA 0-5 0 - 5 WBC/hpf    Comment:        Reflex urine culture not performed if WBC <=10, OR if Squamous epithelial cells >5. If Squamous epithelial cells >5 suggest recollection.    Bacteria, UA RARE (A) NONE SEEN   Squamous Epithelial / HPF 0-5 0 - 5 /HPF   Mucus PRESENT     Comment: Performed at [facility], [address], [place], [place] [address]  Troponin I (High Sensitivity)     Status: Abnormal   Collection Time: 11/21/2022  3:28 PM  Result Value Ref Range   Troponin I (High Sensitivity) 24 (H) <18 ng/L    Comment: (NOTE) Elevated high sensitivity troponin I (hsTnI) values and significant  changes across serial measurements may suggest ACS but many other  chronic and acute conditions are known to elevate hsTnI results.  Refer to the "Links" section for chest pain algorithms and additional  guidance. Performed at [facility], [address] [place], [place] [address]   Procalcitonin     Status: None   Collection Time: 11/10/2022  3:28 PM  Result Value Ref Range   Procalcitonin <0.10 ng/mL    Comment:        Interpretation: PCT (Procalcitonin) <= 0.5 ng/mL: Systemic infection (sepsis) is not likely. Local bacterial infection is possible. (NOTE)       Sepsis PCT Algorithm           Lower Respiratory Tract                                      Infection PCT Algorithm    ----------------------------     ----------------------------         PCT < 0.25 ng/mL                PCT < 0.10 ng/mL           Strongly encourage             Strongly discourage   discontinuation of antibiotics    initiation of antibiotics    ----------------------------     -----------------------------       PCT 0.25 - 0.50 ng/mL            PCT 0.10 - 0.25 ng/mL               OR       >80% decrease in PCT            Discourage initiation of                                            antibiotics      Encourage discontinuation           of antibiotics    ----------------------------     -----------------------------         PCT >= 0.50 ng/mL              PCT  and/or use of iterative reconstruction technique. COMPARISON:  Chest x-ray 11/20/2022, 06/21/2022, CT lung bases 09/01/2017 FINDINGS: Cardiovascular: Limited without intravenous contrast. Advanced aortic atherosclerosis. No aneurysm. Extensive coronary vascular calcification. Mild cardiomegaly. No pericardial effusion Mediastinum/Nodes: Patent trachea. Slight tracheal deviation to the left from tortuous vessels. Mild air distension of the esophagus. Subcentimeter mediastinal lymph nodes. Esophagus within normal limits Lungs/Pleura: Chronic elevation of right diaphragm. Small right greater than left pleural effusions. Consolidation with air bronchograms in the right lower lobe slightly progressive compared to the lung bases on prior CT. Heterogeneous consolidation and ground-glass density in the right upper lobe. Upper Abdomen: No acute finding Musculoskeletal: No acute or suspicious osseous abnormality IMPRESSION: 1. Chronic elevation of right diaphragm. 2. Small right greater than left pleural effusions. 3. Consolidation with air bronchograms in the right lower lobe, slightly progressive  compared to the lung bases on prior CT. Heterogeneous consolidation and ground-glass density in the right upper lobe, findings are suspicious for pneumonia. 4. Aortic atherosclerosis. Aortic Atherosclerosis (ICD10-I70.0). Electronically Signed   By: Jasmine Pang M.D.   On: 11/19/2022 19:36   CT Head Wo Contrast  Result Date: 11/17/2022 CLINICAL DATA:  Mental status change, unknown cause; Polytrauma, blunt EXAM: CT HEAD WITHOUT CONTRAST CT CERVICAL SPINE WITHOUT CONTRAST TECHNIQUE: Multidetector CT imaging of the head and cervical spine was performed following the standard protocol without intravenous contrast. Multiplanar CT image reconstructions of the cervical spine were also generated. RADIATION DOSE REDUCTION: This exam was performed according to the departmental dose-optimization program which includes automated exposure control, adjustment of the mA and/or kV according to patient size and/or use of iterative reconstruction technique. COMPARISON:  None Available. FINDINGS: CT HEAD FINDINGS Brain: No evidence of acute infarction, hemorrhage, hydrocephalus, extra-axial collection or mass lesion/mass effect. Sequela of moderate chronic microvascular ischemic change. Vascular: No hyperdense vessel or unexpected calcification. Skull: Normal. Negative for fracture or focal lesion. Sinuses/Orbits: No middle ear or mastoid effusion. Paranasal sinuses are clear. Bilateral lens replacement. Orbits are otherwise unremarkable. Other: None. CT CERVICAL SPINE FINDINGS Alignment: Grade 1 anterolisthesis of C3 on C4 and C4 on C5. Trace retrolisthesis of C5 on C6. Skull base and vertebrae: No acute fracture. No primary bone lesion or focal pathologic process. Soft tissues and spinal canal: No prevertebral fluid or swelling. No visible canal hematoma. Disc levels:  No evidence of high-grade spinal canal stenosis. Upper chest: Clustered nodularity in the right upper lobe is worrisome for an underlying infectious or  inflammatory process. There is a small right-sided pleural effusion. Recommend further evaluation with a dedicated chest CT for more definitive characterization. Other: None IMPRESSION: 1. No acute intracranial abnormality. 2. No acute fracture or traumatic malalignment of the cervical spine. 3. Clustered nodularity in the right upper lobe is worrisome for an underlying infectious or inflammatory process. There is a small right-sided pleural effusion. Recommend further evaluation with a dedicated chest CT for more definitive characterization. Electronically Signed   By: Lorenza Cambridge M.D.   On: 11/15/2022 15:10   CT Cervical Spine Wo Contrast  Result Date: 11/05/2022 CLINICAL DATA:  Mental status change, unknown cause; Polytrauma, blunt EXAM: CT HEAD WITHOUT CONTRAST CT CERVICAL SPINE WITHOUT CONTRAST TECHNIQUE: Multidetector CT imaging of the head and cervical spine was performed following the standard protocol without intravenous contrast. Multiplanar CT image reconstructions of the cervical spine were also generated. RADIATION DOSE REDUCTION: This exam was performed according to the departmental dose-optimization program which includes automated exposure control, adjustment of the mA and/or kV according to

## 2022-11-11 ENCOUNTER — Inpatient Hospital Stay (HOSPITAL_COMMUNITY): Payer: Medicare Other

## 2022-11-11 DIAGNOSIS — R4182 Altered mental status, unspecified: Secondary | ICD-10-CM | POA: Diagnosis not present

## 2022-11-11 LAB — CBC WITH DIFFERENTIAL/PLATELET
Abs Immature Granulocytes: 0.01 10*3/uL (ref 0.00–0.07)
Basophils Absolute: 0.1 10*3/uL (ref 0.0–0.1)
Basophils Relative: 1 %
Eosinophils Absolute: 0.1 10*3/uL (ref 0.0–0.5)
Eosinophils Relative: 1 %
HCT: 40.4 % (ref 39.0–52.0)
Hemoglobin: 11.9 g/dL — ABNORMAL LOW (ref 13.0–17.0)
Immature Granulocytes: 0 %
Lymphocytes Relative: 12 %
Lymphs Abs: 0.6 10*3/uL — ABNORMAL LOW (ref 0.7–4.0)
MCH: 27.2 pg (ref 26.0–34.0)
MCHC: 29.5 g/dL — ABNORMAL LOW (ref 30.0–36.0)
MCV: 92.4 fL (ref 80.0–100.0)
Monocytes Absolute: 0.5 10*3/uL (ref 0.1–1.0)
Monocytes Relative: 10 %
Neutro Abs: 3.9 10*3/uL (ref 1.7–7.7)
Neutrophils Relative %: 76 %
Platelets: 120 10*3/uL — ABNORMAL LOW (ref 150–400)
RBC: 4.37 MIL/uL (ref 4.22–5.81)
RDW: 18.3 % — ABNORMAL HIGH (ref 11.5–15.5)
WBC: 5 10*3/uL (ref 4.0–10.5)
nRBC: 0 % (ref 0.0–0.2)

## 2022-11-11 LAB — COMPREHENSIVE METABOLIC PANEL
ALT: 13 U/L (ref 0–44)
AST: 15 U/L (ref 15–41)
Albumin: 3.1 g/dL — ABNORMAL LOW (ref 3.5–5.0)
Alkaline Phosphatase: 147 U/L — ABNORMAL HIGH (ref 38–126)
Anion gap: 15 (ref 5–15)
BUN: 47 mg/dL — ABNORMAL HIGH (ref 8–23)
CO2: 35 mmol/L — ABNORMAL HIGH (ref 22–32)
Calcium: 9.3 mg/dL (ref 8.9–10.3)
Chloride: 100 mmol/L (ref 98–111)
Creatinine, Ser: 2.49 mg/dL — ABNORMAL HIGH (ref 0.61–1.24)
GFR, Estimated: 25 mL/min — ABNORMAL LOW (ref >60)
Glucose, Bld: 72 mg/dL (ref 70–99)
Potassium: 3.1 mmol/L — ABNORMAL LOW (ref 3.5–5.1)
Sodium: 150 mmol/L — ABNORMAL HIGH (ref 135–145)
Total Bilirubin: 2.5 mg/dL — ABNORMAL HIGH (ref 0.3–1.2)
Total Protein: 6.9 g/dL (ref 6.5–8.1)

## 2022-11-11 LAB — VITAMIN B12: Vitamin B-12: 259 pg/mL (ref 180–914)

## 2022-11-11 LAB — FOLATE: Folate: 10.8 ng/mL (ref >5.9)

## 2022-11-11 LAB — C-REACTIVE PROTEIN: CRP: 0.9 mg/dL (ref <1.0)

## 2022-11-11 LAB — PROCALCITONIN: Procalcitonin: 0.16 ng/mL

## 2022-11-11 LAB — MAGNESIUM: Magnesium: 2.4 mg/dL (ref 1.7–2.4)

## 2022-11-11 LAB — BRAIN NATRIURETIC PEPTIDE: B Natriuretic Peptide: 484.6 pg/mL — ABNORMAL HIGH (ref 0.0–100.0)

## 2022-11-11 MED ORDER — IPRATROPIUM-ALBUTEROL 0.5-2.5 (3) MG/3ML IN SOLN
3.0000 mL | Freq: Two times a day (BID) | RESPIRATORY_TRACT | Status: DC
  Administered 2022-11-11: 3 mL via RESPIRATORY_TRACT
  Filled 2022-11-11: qty 3

## 2022-11-11 MED ORDER — ENSURE ENLIVE PO LIQD
237.0000 mL | Freq: Two times a day (BID) | ORAL | Status: DC
  Administered 2022-11-12 – 2022-11-14 (×5): 237 mL via ORAL

## 2022-11-11 MED ORDER — FUROSEMIDE 10 MG/ML IJ SOLN
40.0000 mg | Freq: Once | INTRAMUSCULAR | Status: AC
  Administered 2022-11-11: 40 mg via INTRAVENOUS
  Filled 2022-11-11: qty 4

## 2022-11-11 MED ORDER — POTASSIUM CHLORIDE 20 MEQ PO PACK
40.0000 meq | PACK | Freq: Once | ORAL | Status: AC
  Administered 2022-11-11: 40 meq via ORAL
  Filled 2022-11-11: qty 2

## 2022-11-11 MED ORDER — IPRATROPIUM-ALBUTEROL 0.5-2.5 (3) MG/3ML IN SOLN
3.0000 mL | RESPIRATORY_TRACT | Status: DC | PRN
  Administered 2022-11-11 – 2022-11-15 (×5): 3 mL via RESPIRATORY_TRACT
  Filled 2022-11-11 (×5): qty 3

## 2022-11-11 MED ORDER — IPRATROPIUM-ALBUTEROL 0.5-2.5 (3) MG/3ML IN SOLN
3.0000 mL | Freq: Two times a day (BID) | RESPIRATORY_TRACT | Status: DC
  Administered 2022-11-12 – 2022-11-16 (×9): 3 mL via RESPIRATORY_TRACT
  Filled 2022-11-11 (×9): qty 3

## 2022-11-11 MED ORDER — POTASSIUM CL IN DEXTROSE 5% 20 MEQ/L IV SOLN
20.0000 meq | INTRAVENOUS | Status: DC
  Administered 2022-11-11: 20 meq via INTRAVENOUS
  Filled 2022-11-11 (×2): qty 1000

## 2022-11-11 NOTE — Progress Notes (Signed)
   11/11/22 1959  BiPAP/CPAP/SIPAP  Reason BIPAP/CPAP not in use Other(comment) (Pt has hand restraints on. No distress noted. Will continue to monitor.)  BiPAP/CPAP /SiPAP Vitals  Pulse Rate 88  Resp (!) 24  BP (!) 118/53  SpO2 95 %  Bilateral Breath Sounds Diminished  MEWS Score/Color  MEWS Score 1  MEWS Score Color Green

## 2022-11-11 NOTE — Plan of Care (Signed)

## 2022-11-11 NOTE — Progress Notes (Signed)
PROGRESS NOTE                                                                                                                                                                                                             Patient Demographics:    [name], is a 87 y.o. male, DOB - Nov 30, 1935, ZOX:[id number]  Outpatient Primary MD for the patient is Merri Brunette, MD    LOS - 2  Admit date - November 21, 2022    Chief Complaint  Patient presents with   Failure To Thrive       Brief Narrative (HPI from H&P)     87 year old male with history of Parkinson's disease, pulmonary hypertension, dilated cardiomyopathy, CAD, CKD 3B baseline creatinine around 1.6 who was recently started on nuplacid for his Parkinson's about 10 days ago, he took 6 doses of that medication last being on 11/07/2022.  Per caregiver since the start of this medication he has been getting increasingly weak and confused, he is usually wheelchair-bound and cannot walk a little bit but became increasingly more lethargic and short of breath.    Brought to the ER where he was found to have hypoxic respiratory failure, lethargy, CT chest with questionable pneumonia, CT head nonacute, initially required nasal cannula oxygen however overnight became more hypoxic requiring BiPAP with 100% FiO2, transferred to my care on 11/10/2022.  Currently he is obtunded, on BiPAP with 100% FiO2.  Exam reveals 3+ leg edema and crackles, stable WBC, negative procalcitonin and ABG suggests severe hypoxic respiratory failure.   Subjective:   Patient in bed, appears comfortable, denies any headache, no fever, no chest pain or pressure, no shortness of breath , no abdominal pain. No focal weakness.   Assessment  & Plan :    Acute hypoxic respiratory failure despite being on CPAP with initial FiO2 30% and IPAP 30. His exam reveals massive fluid overload, bibasilar crackles, CT chest is of  possible pneumonia however no fever, no white count no procalcitonin.  He was placed on BiPAP, diuresed with IV Lasix, antibiotics titrated down, seen by PCCM.  He is DNR, continue medical treatment, significant improvement on 11/11/2022, continue as needed Lasix, continue present antibiotics, supplemental oxygen, I-S flutter valve for pulmonary toiletry and monitor.  Worsening encephalopathy.  Due to combination of hypoxia now, underlying Parkinson's and recent exposure to Nuplacid -  CT head unremarkable, no focal deficits, holding Nuplazid, oxygen as above, stable EEG, with offending medication outpatient improving.  Neurology on board.  HX of Parkinson's.  Currently continuing home medication Sinemet, PT OT and speech eval, neurology on board.  AKI on CKD 3B with hypernatremia.  Baseline creatinine 1.6, AKI in the setting of massive fluid overload, he does use significant amount of diuretics at home, continue daily dosing of diuretics, nephrology on board.  Encouraged free water intake.  Paroxysmal atrial fibrillation [place] vas 2 score greater than 3.  On Eliquis, continue as tolerated, low-dose beta-blocker scheduled oral and IV and as needed.  Massive lower extremity edema.  Diurese.  Nonspecific CT cervical spine changes-CT cervical spine showed grade 1 anterolisthesis of C3 on C4 and C4 and C5, trace retrolisthesis of C5 on C6. [name]  Outpatient neurosurgery follow-up.  No neck pain.      Condition - Extremely Guarded  Family Communication  : Family decision maker girlfriend [name] [phone] on 11/10/2022, message left 11/11/2022 9:30 AM  Code Status :  DNR-I  Consults  : PCCM, neurology, nephrology  PUD Prophylaxis :     Procedures  :     CT Chest - 1. Chronic elevation of right diaphragm. 2. Small right greater than left pleural effusions. 3. Consolidation with air bronchograms in the right lower lobe, slightly progressive compared to the lung bases on prior CT. Heterogeneous  consolidation and ground-glass density in the right upper lobe, findings are suspicious for pneumonia. 4. Aortic atherosclerosis. Aortic Atherosclerosis (ICD10-I70.0).  CT head and C Spine - 1. No acute intracranial abnormality. 2. No acute fracture or traumatic malalignment of the cervical spine. 3. Clustered nodularity in the right upper lobe is worrisome for an underlying infectious or inflammatory process. There is a small right-sided pleural effusion. Recommend further evaluation with a dedicated chest CT for more definitive characterization.      Disposition Plan  :    Status is: Inpatient  DVT Prophylaxis  :    apixaban (ELIQUIS) tablet 2.5 mg Start: 11/06/2022 2200 apixaban (ELIQUIS) tablet 2.5 mg     Lab Results  Component Value Date   PLT 115 (L) 11/10/2022    Diet :  Diet Order             DIET DYS 3 Room service appropriate? No; Fluid consistency: Thin  Diet effective now                    Inpatient Medications  Scheduled Meds:  apixaban  2.5 mg Oral BID   carbidopa-levodopa  2 tablet Oral TID   ipratropium-albuterol  3 mL Nebulization Q4H   Continuous Infusions:  ampicillin-sulbactam (UNASYN) IV 3 g (11/10/22 2121)   PRN Meds:.acetaminophen **OR** acetaminophen, ondansetron **OR** ondansetron (ZOFRAN) IV    Objective:   Vitals:   11/11/22 0404 11/11/22 0700 11/11/22 0800 11/11/22 0833  BP: 136/75 (!) 170/120 (!) 145/125   Pulse: 86 81 85   Resp: 17  14   Temp: 97.9 F (36.6 C)     TempSrc: Oral     SpO2: 96% 96% 96% 96%  Weight:      Height:        Wt Readings from Last 3 Encounters:  11/08/2022 87.5 kg  07/08/22 87.5 kg  06/22/22 89.4 kg     Intake/Output Summary (Last 24 hours) at 11/11/2022 0928 Last data filed at 11/11/2022 0618 Gross per 24 hour  Intake 100 ml  Output 4100 ml  Net -4000 ml     Physical Exam  Awake, more alert this morning, following basic commands, on nasal cannula oxygen this morning, [place].AT,PERRAL Supple  Neck, No JVD,   Symmetrical Chest wall movement, Good air movement bilaterally, bilateral crackles RRR,No Gallops,Rubs or new Murmurs,  +ve B.Sounds, Abd Soft, No tenderness,   3+ leg edema    Data Review:    Recent Labs  Lab 11/21/2022 1300 11/17/2022 1316 11/10/22 0045  WBC 4.3  --  4.3  HGB 12.2* 13.6 11.3*  HCT 40.9 40.0 38.3*  PLT 109*  --  115*  MCV 93.0  --  92.5  MCH 27.7  --  27.3  MCHC 29.8*  --  29.5*  RDW 18.2*  --  18.2*  LYMPHSABS 0.3*  --   --   MONOABS 0.3  --   --   EOSABS 0.1  --   --   BASOSABS 0.0  --   --     Recent Labs  Lab 11/26/2022 1245 11/11/2022 1300 11/27/2022 1311 10/30/2022 1316 11/06/2022 1528 11/06/2022 1545 11/10/22 0045 11/10/22 0613 11/10/22 0630 11/10/22 1208 11/10/22 1636  NA  --  142  --  142  --   --  143  --   --   --  148*  K  --  4.6  --  4.5  --   --  4.2  --   --   --  3.5  CL  --  97*  --  99  --   --  99  --   --   --  103  CO2  --  31  --   --   --   --  29  --   --   --  33*  ANIONGAP  --  14  --   --   --   --  15  --   --   --  12  GLUCOSE  --  80  --  81  --   --  82  --   --   --  78  BUN  --  54*  --  57*  --   --  50*  --   --   --  49*  CREATININE  --  2.52*  --  2.60*  --   --  2.56*  --   --   --  2.44*  AST  --  25  --   --   --   --  20  --   --   --   --   ALT  --  7  --   --   --   --  10  --   --   --   --   ALKPHOS  --  144*  --   --   --   --  131*  --   --   --   --   BILITOT  --  1.7*  --   --   --   --  2.1*  --   --   --   --   ALBUMIN  --  3.3*  --   --   --   --  3.1*  --   --   --   --   CRP  --   --   --   --   --   --   --   --  <0.5  --   --  PROCALCITON  --   --   --   --  <0.10  --   --   --  0.11  --   --   LATICACIDVEN  --   --  1.2  --   --  1.4  --   --   --   --   --   INR  --  1.5*  --   --   --   --   --   --   --   --   --   AMMONIA  --   --   --   --   --   --   --   --   --  32  --   BNP 225.9*  --   --   --   --   --   --  305.9*  --   --   --   CALCIUM  --  9.5  --   --   --   --  9.0   --   --   --  9.0      Recent Labs  Lab 11/26/2022 1245 11-26-2022 1300 2022/11/26 1311 11-26-22 1528 2022-11-26 1545 11/10/22 0045 11/10/22 0613 11/10/22 0630 11/10/22 1208 11/10/22 1636  CRP  --   --   --   --   --   --   --  <0.5  --   --   PROCALCITON  --   --   --  <0.10  --   --   --  0.11  --   --   LATICACIDVEN  --   --  1.2  --  1.4  --   --   --   --   --   INR  --  1.5*  --   --   --   --   --   --   --   --   AMMONIA  --   --   --   --   --   --   --   --  32  --   BNP 225.9*  --   --   --   --   --  305.9*  --   --   --   CALCIUM  --  9.5  --   --   --  9.0  --   --   --  9.0   ABG    Component Value Date/Time   PHART 7.45 11/10/2022 0803   PCO2ART 52 (H) 11/10/2022 0803   PO2ART 100 11/10/2022 0803   HCO3 36.1 (H) 11/10/2022 0803   TCO2 35 (H) November 26, 2022 1316   O2SAT 97.3 11/10/2022 0803     Micro Results Recent Results (from the past 240 hour(s))  Resp panel by RT-PCR (RSV, Flu A&B, Covid) Anterior Nasal Swab     Status: None   Collection Time: Nov 26, 2022 12:44 PM   Specimen: Anterior Nasal Swab  Result Value Ref Range Status   SARS Coronavirus 2 by RT PCR NEGATIVE NEGATIVE Final   Influenza A by PCR NEGATIVE NEGATIVE Final   Influenza B by PCR NEGATIVE NEGATIVE Final    Comment: (NOTE) The Xpert Xpress SARS-CoV-2/FLU/RSV plus assay is intended as an aid in the diagnosis of influenza from Nasopharyngeal swab specimens and should not be used as a sole basis for treatment. Nasal washings and aspirates are unacceptable for Xpert Xpress SARS-CoV-2/FLU/RSV testing.  Fact Sheet for Patients: BloggerCourse.com  Fact Sheet  for Healthcare Providers: SeriousBroker.it  This test is not yet approved or cleared by the [place] and has been authorized for detection and/or diagnosis of SARS-CoV-2 by FDA under an Emergency Use Authorization (EUA). This EUA will remain in effect (meaning this test can be used)  for the duration of the COVID-19 declaration under Section 564(b)(1) of the Act, 21 U.S.C. section 360bbb-3(b)(1), unless the authorization is terminated or revoked.     Resp Syncytial Virus by PCR NEGATIVE NEGATIVE Final    Comment: (NOTE) Fact Sheet for Patients: BloggerCourse.com  Fact Sheet for Healthcare Providers: SeriousBroker.it  This test is not yet approved or cleared by the [place] FDA and has been authorized for detection and/or diagnosis of SARS-CoV-2 by FDA under an Emergency Use Authorization (EUA). This EUA will remain in effect (meaning this test can be used) for the duration of the COVID-19 declaration under Section 564(b)(1) of the Act, 21 U.S.C. section 360bbb-3(b)(1), unless the authorization is terminated or revoked.  Performed at [facility], [address] [place], [place] [address]   Blood Culture (routine x 2)     Status: None (Preliminary result)   Collection Time: 11/05/2022  1:00 PM   Specimen: BLOOD RIGHT FOREARM  Result Value Ref Range Status   Specimen Description BLOOD RIGHT FOREARM  Final   Special Requests   Final    BOTTLES DRAWN AEROBIC AND ANAEROBIC Blood Culture results may not be optimal due to an excessive volume of blood received in culture bottles   Culture   Final    NO GROWTH 2 DAYS Performed at [facility], [address] [place], [place] [address]    Report Status PENDING  Incomplete  Blood Culture (routine x 2)     Status: None (Preliminary result)   Collection Time: 10/31/2022  1:17 PM   Specimen: BLOOD  Result Value Ref Range Status   Specimen Description BLOOD LEFT ANTECUBITAL  Final   Special Requests   Final    BOTTLES DRAWN AEROBIC AND ANAEROBIC Blood Culture results may not be optimal due to an excessive volume of blood received in culture bottles   Culture   Final    NO GROWTH 2 DAYS Performed at [facility], [address] [place],  [place] [address]    Report Status PENDING  Incomplete    Radiology Reports DG Chest Port 1 View  Result Date: 11/11/2022 CLINICAL DATA:  Shortness of breath and altered mental status. EXAM: PORTABLE CHEST 1 VIEW COMPARISON:  11/10/2022 FINDINGS: Asymmetric elevation right hemidiaphragm with persistent right base collapse/consolidation. Vascular congestion with diffuse interstitial prominence. The cardio pericardial silhouette is enlarged. Bones are diffusely demineralized. Telemetry leads overlie the chest. IMPRESSION: 1. Persistent right base collapse/consolidation. 2. Vascular congestion with diffuse interstitial prominence, likely chronic. Electronically Signed   By: Kennith Center M.D.   On: 11/11/2022 07:56   EEG adult  Result Date: 11/10/2022 Charlsie Quest, MD     11/10/2022  2:43 PM Patient Name: Estal Gelo MRN: [id number] Epilepsy Attending: Charlsie Quest Referring Physician/Provider: Leroy Sea, MD Date: 11/10/2022 Duration: 26.03 mins Patient history: 87yo m with ams getting eeg to evaluate for seizure Level of alertness: Awake, asleep AEDs during EEG study: Ativan Technical aspects: This EEG study was done with scalp electrodes positioned according to the 10-20 International system of electrode placement. Electrical activity was reviewed with band pass filter of 1-70Hz , sensitivity of 7 uV/mm, display speed of 75mm/sec with  a 60Hz  notched filter applied as appropriate. EEG data were recorded continuously and digitally stored.  Video monitoring was available and reviewed as appropriate. Description: No posterior dominant rhythm was seen. Sleep was characterized by  sleep spindles (12 to 14 Hz), maximal frontocentral region. EEG showed continuous generalized 3 to 6 Hz theta-delta slowing. Hyperventilation and photic stimulation were not performed.   ABNORMALITY - Continuous slow, generalized IMPRESSION: This study is suggestive of moderate diffuse encephalopathy. No seizures or  epileptiform discharges were seen throughout the recording. Charlsie Quest   DG Chest Port 1 View  Result Date: 11/10/2022 CLINICAL DATA:  141880 SOB (shortness of breath) [id number] EXAM: PORTABLE CHEST 1 VIEW COMPARISON:  CT chest 08-Dec-2022, chest x-ray 08-Dec-2022 FINDINGS: The heart and mediastinal contours are unchanged. Aortic calcification. Low lung volumes. Right base atelectasis with elevated right hemidiaphragm. Question airspace opacity of the right lung. Increased interstitial markings. No pleural effusion. No pneumothorax. No acute osseous abnormality. IMPRESSION: 1. Right base atelectasis with elevated right hemidiaphragm. Questioned airspace opacity of the right lung correlates with airspace opacity on CT chest 2022/12/08. 2.  Aortic Atherosclerosis (ICD10-I70.0). Electronically Signed   By: Tish Frederickson M.D.   On: 11/10/2022 07:08   CT CHEST WO CONTRAST  Result Date: 12/08/2022 CLINICAL DATA:  Possible sepsis failure to thrive EXAM: CT CHEST WITHOUT CONTRAST TECHNIQUE: Multidetector CT imaging of the chest was performed following the standard protocol without IV contrast. RADIATION DOSE REDUCTION: This exam was performed according to the departmental dose-optimization program which includes automated exposure control, adjustment of the mA and/or kV according to patient size and/or use of iterative reconstruction technique. COMPARISON:  Chest x-ray 12/08/2022, 06/21/2022, CT lung bases 09/01/2017 FINDINGS: Cardiovascular: Limited without intravenous contrast. Advanced aortic atherosclerosis. No aneurysm. Extensive coronary vascular calcification. Mild cardiomegaly. No pericardial effusion Mediastinum/Nodes: Patent trachea. Slight tracheal deviation to the left from tortuous vessels. Mild air distension of the esophagus. Subcentimeter mediastinal lymph nodes. Esophagus within normal limits Lungs/Pleura: Chronic elevation of right diaphragm. Small right greater than left pleural effusions.  Consolidation with air bronchograms in the right lower lobe slightly progressive compared to the lung bases on prior CT. Heterogeneous consolidation and ground-glass density in the right upper lobe. Upper Abdomen: No acute finding Musculoskeletal: No acute or suspicious osseous abnormality IMPRESSION: 1. Chronic elevation of right diaphragm. 2. Small right greater than left pleural effusions. 3. Consolidation with air bronchograms in the right lower lobe, slightly progressive compared to the lung bases on prior CT. Heterogeneous consolidation and ground-glass density in the right upper lobe, findings are suspicious for pneumonia. 4. Aortic atherosclerosis. Aortic Atherosclerosis (ICD10-I70.0). Electronically Signed   By: Jasmine Pang M.D.   On: Dec 08, 2022 19:36   CT Head Wo Contrast  Result Date: 12-08-2022 CLINICAL DATA:  Mental status change, unknown cause; Polytrauma, blunt EXAM: CT HEAD WITHOUT CONTRAST CT CERVICAL SPINE WITHOUT CONTRAST TECHNIQUE: Multidetector CT imaging of the head and cervical spine was performed following the standard protocol without intravenous contrast. Multiplanar CT image reconstructions of the cervical spine were also generated. RADIATION DOSE REDUCTION: This exam was performed according to the departmental dose-optimization program which includes automated exposure control, adjustment of the mA and/or kV according to patient size and/or use of iterative reconstruction technique. COMPARISON:  None Available. FINDINGS: CT HEAD FINDINGS Brain: No evidence of acute infarction, hemorrhage, hydrocephalus, extra-axial collection or mass lesion/mass effect. Sequela of moderate chronic microvascular ischemic change. Vascular: No hyperdense vessel or unexpected calcification. Skull: Normal. Negative for fracture or focal lesion. Sinuses/Orbits:  No middle ear or mastoid effusion. Paranasal sinuses are clear. Bilateral lens replacement. Orbits are otherwise unremarkable. Other: None. CT  CERVICAL SPINE FINDINGS Alignment: Grade 1 anterolisthesis of C3 on C4 and C4 on C5. Trace retrolisthesis of C5 on C6. Skull base and vertebrae: No acute fracture. No primary bone lesion or focal pathologic process. Soft tissues and spinal canal: No prevertebral fluid or swelling. No visible canal hematoma. Disc levels:  No evidence of high-grade spinal canal stenosis. Upper chest: Clustered nodularity in the right upper lobe is worrisome for an underlying infectious or inflammatory process. There is a small right-sided pleural effusion. Recommend further evaluation with a dedicated chest CT for more definitive characterization. Other: None IMPRESSION: 1. No acute intracranial abnormality. 2. No acute fracture or traumatic malalignment of the cervical spine. 3. Clustered nodularity in the right upper lobe is worrisome for an underlying infectious or inflammatory process. There is a small right-sided pleural effusion. Recommend further evaluation with a dedicated chest CT for more definitive characterization. Electronically Signed   By: Lorenza Cambridge M.D.   On: 11-12-22 15:10   CT Cervical Spine Wo Contrast  Result Date: Nov 12, 2022 CLINICAL DATA:  Mental status change, unknown cause; Polytrauma, blunt EXAM: CT HEAD WITHOUT CONTRAST CT CERVICAL SPINE WITHOUT CONTRAST TECHNIQUE: Multidetector CT imaging of the head and cervical spine was performed following the standard protocol without intravenous contrast. Multiplanar CT image reconstructions of the cervical spine were also generated. RADIATION DOSE REDUCTION: This exam was performed according to the departmental dose-optimization program which includes automated exposure control, adjustment of the mA and/or kV according to patient size and/or use of iterative reconstruction technique. COMPARISON:  None Available. FINDINGS: CT HEAD FINDINGS Brain: No evidence of acute infarction, hemorrhage, hydrocephalus, extra-axial collection or mass lesion/mass effect.  Sequela of moderate chronic microvascular ischemic change. Vascular: No hyperdense vessel or unexpected calcification. Skull: Normal. Negative for fracture or focal lesion. Sinuses/Orbits: No middle ear or mastoid effusion. Paranasal sinuses are clear. Bilateral lens replacement. Orbits are otherwise unremarkable. Other: None. CT CERVICAL SPINE FINDINGS Alignment: Grade 1 anterolisthesis of C3 on C4 and C4 on C5. Trace retrolisthesis of C5 on C6. Skull base and vertebrae: No acute fracture. No primary bone lesion or focal pathologic process. Soft tissues and spinal canal: No prevertebral fluid or swelling. No visible canal hematoma. Disc levels:  No evidence of high-grade spinal canal stenosis. Upper chest: Clustered nodularity in the right upper lobe is worrisome for an underlying infectious or inflammatory process. There is a small right-sided pleural effusion. Recommend further evaluation with a dedicated chest CT for more definitive characterization. Other: None IMPRESSION: 1. No acute intracranial abnormality. 2. No acute fracture or traumatic malalignment of the cervical spine. 3. Clustered nodularity in the right upper lobe is worrisome for an underlying infectious or inflammatory process. There is a small right-sided pleural effusion. Recommend further evaluation with a dedicated chest CT for more definitive characterization. Electronically Signed   By: Lorenza Cambridge M.D.   On: November 12, 2022 15:10   DG Chest Port 1 View  Result Date: 11-12-2022 CLINICAL DATA:  Questionable sepsis - evaluate for abnormality EXAM: PORTABLE CHEST 1 VIEW COMPARISON:  CXR 06/19/20 FINDINGS: Asymmetric elevation of the right hemidiaphragm. No pleural effusion. There is a hazy opacity at the right lung base, which could represent atelectasis or infection. No radiographically apparent displaced rib fractures. Visualized upper abdomen is unremarkable. Aortic atherosclerotic calcifications. IMPRESSION: Hazy opacity at the right lung  base could represent atelectasis or infection. Electronically Signed  By: Lorenza Cambridge M.D.   On: 11/10/2022 14:08      Signature  -   Susa Raring M.D on 11/11/2022 at 9:28 AM   -  To page go to [email or web address]

## 2022-11-11 NOTE — Care Management Important Message (Signed)
Important Message  Patient Details  Name: [name] MRN: [id number] Date of Birth: 12-11-1935   Medicare Important Message Given:  Yes     Mardene Sayer 11/11/2022, 3:28 PM

## 2022-11-11 NOTE — Evaluation (Signed)
Clinical/Bedside Swallow Evaluation Patient Details  Name: [name] MRN: [id number] Date of Birth: January 23, 1936  Today's Date: 11/11/2022 Time: SLP Start Time (ACUTE ONLY): 0835 SLP Stop Time (ACUTE ONLY): 0855 SLP Time Calculation (min) (ACUTE ONLY): 20 min  Past Medical History:  Past Medical History:  Diagnosis Date   Arthritis    "knees; lower back" (08/09/2016)   Atrial fibrillation (HCC)    remote hx/notes 08/07/2016   Coronary artery disease    Episodes of trembling    "most of the time it's my left hand" (08/09/2016)   GI bleeding 08/2017   High cholesterol    History of kidney stones    Hypertension    Melanoma of shoulder (HCC) 2000s   "left"   Mitral regurgitation and mitral stenosis    hx/notes 08/07/2016   Moderate aortic stenosis    hx/notes 08/07/2016   Parkinson's disease    Prostate cancer (HCC) 2011   hx/notes 08/07/2016   Past Surgical History:  Past Surgical History:  Procedure Laterality Date   CARDIAC CATHETERIZATION  ~ [address], [place]"   CARDIOVERSION N/A 01/26/2017   Procedure: CARDIOVERSION;  Surgeon: Elder Negus, MD;  Location: [facility] ENDOSCOPY;  Service: Cardiovascular;  Laterality: N/A;   CARDIOVERSION N/A 03/06/2018   Procedure: CARDIOVERSION;  Surgeon: Elder Negus, MD;  Location: [facility] ENDOSCOPY;  Service: Cardiovascular;  Laterality: N/A;   CATARACT EXTRACTION W/ INTRAOCULAR LENS  IMPLANT, BILATERAL Bilateral 2014-02/2016   left-right; hx/notes 08/07/2016   COLONOSCOPY WITH PROPOFOL N/A 09/25/2017   Procedure: COLONOSCOPY WITH PROPOFOL;  Surgeon: Carman Ching, MD;  Location: [facility] ENDOSCOPY;  Service: Endoscopy;  Laterality: N/A;   CORONARY ANGIOPLASTY WITH STENT PLACEMENT  08/09/2016   CORONARY ATHERECTOMY N/A 08/09/2016   Procedure: Coronary Atherectomy;  Surgeon: Yates Decamp, MD;  Location: [facility] INVASIVE CV LAB;  Service: Cardiovascular;  Laterality: N/A;   CORONARY PRESSURE/FFR STUDY N/A 08/09/2016   Procedure:  Intravascular Pressure Wire/FFR Study;  Surgeon: Yates Decamp, MD;  Location: [facility] INVASIVE CV LAB;  Service: Cardiovascular;  Laterality: N/A;   CORONARY STENT INTERVENTION N/A 08/09/2016   Procedure: Coronary Stent Intervention;  Surgeon: Yates Decamp, MD;  Location: [facility] INVASIVE CV LAB;  Service: Cardiovascular;  Laterality: N/A;  LAD   EYE SURGERY Bilateral 05/2016   "laser on both lenses"   FEMUR IM NAIL Left 05/20/2020   Procedure: INTRAMEDULLARY (IM) NAIL FEMORAL;  Surgeon: Samson Frederic, MD;  Location: [facility] ORS;  Service: Orthopedics;  Laterality: Left;   HEMORRHOID SURGERY N/A 09/27/2017   Procedure: HEMORRHOIDECTOMY;  Surgeon: Harriette Bouillon, MD;  Location: [facility] OR;  Service: General;  Laterality: N/A;   INGUINAL HERNIA REPAIR Left 1991   INSERTION PROSTATE RADIATION SEED  06/25/2009   LAPAROSCOPIC CHOLECYSTECTOMY  1993   MELANOMA EXCISION Left 1990s   "shoulder"   MELANOMA EXCISION Right 08/2018   right shoulder   MELANOMA EXCISION  04/20/2021   RIGHT HEART CATH N/A 03/20/2018   Procedure: RIGHT HEART CATH;  Surgeon: Elder Negus, MD;  Location: [facility] INVASIVE CV LAB;  Service: Cardiovascular;  Laterality: N/A;   RIGHT/LEFT HEART CATH AND CORONARY ANGIOGRAPHY N/A 08/09/2016   Procedure: Right/Left Heart Cath and Coronary Angiography;  Surgeon: Yates Decamp, MD;  Location: [facility] INVASIVE CV LAB;  Service: Cardiovascular;  Laterality: N/A;   SURGERY FOR BROKEN FEMUR Left 05/24/2020   TEE WITHOUT CARDIOVERSION N/A 01/25/2017   Procedure: TRANSESOPHAGEAL ECHOCARDIOGRAM (TEE);  Surgeon: Elder Negus, MD;  Location: [facility] ENDOSCOPY;  Service: Cardiovascular;  Laterality: N/A;  HPI:  [name]  is a 87 y.o. male, with past medical history of Parkinson disease, pulmonary hypertension, dilated cardiomyopathy, CAD, CKD stage III b, who was brought to the ED for worsening hallucinations and generalized weakness. In the ED patient was found to be hypothermic, Chest x-ray showed hazy  infiltrate at right lung base. Had needed BIPAP yesterday.    Assessment / Plan / Recommendation  Clinical Impression  Pt demonstrated pharyngeal dysphagia primarily with straw use. He was pleasantly confused trying to remove mitts but able to participate. No oral motor impairments and volitional cough was weak. He had intermittent throat clears throughout but a strong cough each time a straw was used. Straw removed eliminated s/s aspiration. Mandibular movement appeared somewhat diminished with solid and pt agreed that chopped meats with Dys 3 texture is optimal. Recommend continue thin liquids via cup, NO straw and small sips via cup. RN can attempt meds whole in puree and if pockets can crush meds. SLP will continue to follow. SLP Visit Diagnosis: Dysphagia, unspecified (R13.10)    Aspiration Risk  Mild aspiration risk    Diet Recommendation Dysphagia 3 (Mech soft);Thin liquid    Liquid Administration via: No straw;Cup Medication Administration: Whole meds with puree (crush if difficulty with whole) Supervision: Staff to assist with self feeding;Full supervision/cueing for compensatory strategies Compensations: Slow rate;Small sips/bites Postural Changes: Seated upright at 90 degrees    Other  Recommendations Oral Care Recommendations: Oral care BID    Recommendations for follow up therapy are one component of a multi-disciplinary discharge planning process, led by the attending physician.  Recommendations may be updated based on patient status, additional functional criteria and insurance authorization.  Follow up Recommendations  (TBD)      Assistance Recommended at Discharge    Functional Status Assessment Patient has had a recent decline in their functional status and demonstrates the ability to make significant improvements in function in a reasonable and predictable amount of time.  Frequency and Duration min 2x/week  2 weeks       Prognosis Prognosis for improved  oropharyngeal function: Good Barriers to Reach Goals: Cognitive deficits      Swallow Study   General Date of Onset: 2022/11/24 HPI: [name]  is a 87 y.o. male, with past medical history of Parkinson disease, pulmonary hypertension, dilated cardiomyopathy, CAD, CKD stage III b, who was brought to the ED for worsening hallucinations and generalized weakness. In the ED patient was found to be hypothermic, Chest x-ray showed hazy infiltrate at right lung base. Had needed BIPAP yesterday. Type of Study: Bedside Swallow Evaluation Previous Swallow Assessment:  (none) Diet Prior to this Study: NPO Temperature Spikes Noted: No Respiratory Status: Nasal cannula History of Recent Intubation: No Behavior/Cognition: Alert;Cooperative;Other (Comment);Pleasant mood (trying to pull mittsl off) Oral Cavity Assessment: Within Functional Limits Oral Care Completed by SLP: No Oral Cavity - Dentition: Adequate natural dentition (uncertain if missing some posterior- difficutl to view) Vision: Functional for self-feeding Self-Feeding Abilities: Needs assist Patient Positioning: Upright in bed Baseline Vocal Quality: Normal Volitional Cough: Weak Volitional Swallow: Able to elicit    Oral/Motor/Sensory Function Overall Oral Motor/Sensory Function: Within functional limits   Ice Chips Ice chips: Not tested   Thin Liquid Thin Liquid: Impaired Presentation: Straw;Cup Oral Phase Impairments:  (none) Pharyngeal  Phase Impairments: Cough - Immediate    Nectar Thick Nectar Thick Liquid: Not tested   Honey Thick Honey Thick Liquid: Not tested   Puree Puree: Within functional limits   Solid  Solid: Impaired Oral Phase Functional Implications: Prolonged oral transit      Royce Macadamia 11/11/2022,9:13 AM

## 2022-11-11 NOTE — Progress Notes (Addendum)
[facility] Progress Note   Subjective:   Patient seen in bed, does not respond to questions but follows some simple commands. Some history provided by nurse at bedside.   Objective Vitals:   11/11/22 0833 11/11/22 1000 11/11/22 1010 11/11/22 1100  BP:      Pulse:  75 67 80  Resp:  (!) 8 10 14   Temp:      TempSrc:      SpO2: 96% 98% 98% 98%  Weight:      Height:       Physical Exam General: Well-nourished, obtunded elderly male lying supine on hospital bed in no acute distress.  Heart: RRR, grade II/VI systolic murmur best heard over R 2nd ICS parasternally. Lungs: Clear in bilateral lateral fields. No increased work of breathing.  Abdomen: Soft, non-tender, non-distended.  Extremities: 3-4+ edema in bilateral lower extremities; legs appear red and warm to the touch. Upper extremities unremarkable.    Additional Objective Labs: Basic Metabolic Panel: Recent Labs  Lab 11/27/2022 1300 11/03/2022 1316 11/10/22 0045 11/10/22 1636  NA 142 142 143 148*  K 4.6 4.5 4.2 3.5  CL 97* 99 99 103  CO2 31  --  29 33*  GLUCOSE 80 81 82 78  BUN 54* 57* 50* 49*  CREATININE 2.52* 2.60* 2.56* 2.44*  CALCIUM 9.5  --  9.0 9.0   Liver Function Tests: Recent Labs  Lab 11/18/2022 1300 11/10/22 0045  AST 25 20  ALT 7 10  ALKPHOS 144* 131*  BILITOT 1.7* 2.1*  PROT 6.8 6.5  ALBUMIN 3.3* 3.1*   CBC: Recent Labs  Lab 11/25/2022 1300 11/01/2022 1316 11/10/22 0045  WBC 4.3  --  4.3  NEUTROABS 3.6  --   --   HGB 12.2* 13.6 11.3*  HCT 40.9 40.0 38.3*  MCV 93.0  --  92.5  PLT 109*  --  115*   Blood Culture    Component Value Date/Time   SDES BLOOD LEFT ANTECUBITAL 11/10/2022 1317   SPECREQUEST  10/31/2022 1317    BOTTLES DRAWN AEROBIC AND ANAEROBIC Blood Culture results may not be optimal due to an excessive volume of blood received in culture bottles   CULT  11/02/2022 1317    NO GROWTH 2 DAYS Performed at [facility], [address] [place], [place]  [address]    REPTSTATUS PENDING 11/27/2022 1317    Cardiac Enzymes: Recent Labs  Lab 11/13/2022 1300  CKTOTAL 82   CBG: Recent Labs  Lab 11/04/2022 1339  GLUCAP 84   Studies/Results: DG Chest Port 1 View  Result Date: 11/11/2022 CLINICAL DATA:  Shortness of breath and altered mental status. EXAM: PORTABLE CHEST 1 VIEW COMPARISON:  11/10/2022 FINDINGS: Asymmetric elevation right hemidiaphragm with persistent right base collapse/consolidation. Vascular congestion with diffuse interstitial prominence. The cardio pericardial silhouette is enlarged. Bones are diffusely demineralized. Telemetry leads overlie the chest. IMPRESSION: 1. Persistent right base collapse/consolidation. 2. Vascular congestion with diffuse interstitial prominence, likely chronic. Electronically Signed   By: Kennith Center M.D.   On: 11/11/2022 07:56   EEG adult  Result Date: 11/10/2022 Charlsie Quest, MD     11/10/2022  2:43 PM Patient Name: [name] MRN: [id number] Epilepsy Attending: Charlsie Quest Referring Physician/Provider: Leroy Sea, MD Date: 11/10/2022 Duration: 26.03 mins Patient history: 87yo m with ams getting eeg to evaluate for seizure Level of alertness: Awake, asleep AEDs during EEG study: Ativan Technical aspects: This EEG study was done with scalp electrodes positioned  according to the 10-20 International system of electrode placement. Electrical activity was reviewed with band pass filter of 1-70Hz , sensitivity of 7 uV/mm, display speed of 23mm/sec with a 60Hz  notched filter applied as appropriate. EEG data were recorded continuously and digitally stored.  Video monitoring was available and reviewed as appropriate. Description: No posterior dominant rhythm was seen. Sleep was characterized by  sleep spindles (12 to 14 Hz), maximal frontocentral region. EEG showed continuous generalized 3 to 6 Hz theta-delta slowing. Hyperventilation and photic stimulation were not performed.   ABNORMALITY -  Continuous slow, generalized IMPRESSION: This study is suggestive of moderate diffuse encephalopathy. No seizures or epileptiform discharges were seen throughout the recording. Charlsie Quest   DG Chest Port 1 View  Result Date: 11/10/2022 CLINICAL DATA:  [id number] SOB (shortness of breath) [id number] EXAM: PORTABLE CHEST 1 VIEW COMPARISON:  CT chest 11/12/2022, chest x-ray 11/19/2022 FINDINGS: The heart and mediastinal contours are unchanged. Aortic calcification. Low lung volumes. Right base atelectasis with elevated right hemidiaphragm. Question airspace opacity of the right lung. Increased interstitial markings. No pleural effusion. No pneumothorax. No acute osseous abnormality. IMPRESSION: 1. Right base atelectasis with elevated right hemidiaphragm. Questioned airspace opacity of the right lung correlates with airspace opacity on CT chest 10/30/2022. 2.  Aortic Atherosclerosis (ICD10-I70.0). Electronically Signed   By: Tish Frederickson M.D.   On: 11/10/2022 07:08   CT CHEST WO CONTRAST  Result Date: 11/19/2022 CLINICAL DATA:  Possible sepsis failure to thrive EXAM: CT CHEST WITHOUT CONTRAST TECHNIQUE: Multidetector CT imaging of the chest was performed following the standard protocol without IV contrast. RADIATION DOSE REDUCTION: This exam was performed according to the departmental dose-optimization program which includes automated exposure control, adjustment of the mA and/or kV according to patient size and/or use of iterative reconstruction technique. COMPARISON:  Chest x-ray 11/26/2022, 06/21/2022, CT lung bases 09/01/2017 FINDINGS: Cardiovascular: Limited without intravenous contrast. Advanced aortic atherosclerosis. No aneurysm. Extensive coronary vascular calcification. Mild cardiomegaly. No pericardial effusion Mediastinum/Nodes: Patent trachea. Slight tracheal deviation to the left from tortuous vessels. Mild air distension of the esophagus. Subcentimeter mediastinal lymph nodes. Esophagus within  normal limits Lungs/Pleura: Chronic elevation of right diaphragm. Small right greater than left pleural effusions. Consolidation with air bronchograms in the right lower lobe slightly progressive compared to the lung bases on prior CT. Heterogeneous consolidation and ground-glass density in the right upper lobe. Upper Abdomen: No acute finding Musculoskeletal: No acute or suspicious osseous abnormality IMPRESSION: 1. Chronic elevation of right diaphragm. 2. Small right greater than left pleural effusions. 3. Consolidation with air bronchograms in the right lower lobe, slightly progressive compared to the lung bases on prior CT. Heterogeneous consolidation and ground-glass density in the right upper lobe, findings are suspicious for pneumonia. 4. Aortic atherosclerosis. Aortic Atherosclerosis (ICD10-I70.0). Electronically Signed   By: Jasmine Pang M.D.   On: 11/03/2022 19:36   CT Head Wo Contrast  Result Date: 11/03/2022 CLINICAL DATA:  Mental status change, unknown cause; Polytrauma, blunt EXAM: CT HEAD WITHOUT CONTRAST CT CERVICAL SPINE WITHOUT CONTRAST TECHNIQUE: Multidetector CT imaging of the head and cervical spine was performed following the standard protocol without intravenous contrast. Multiplanar CT image reconstructions of the cervical spine were also generated. RADIATION DOSE REDUCTION: This exam was performed according to the departmental dose-optimization program which includes automated exposure control, adjustment of the mA and/or kV according to patient size and/or use of iterative reconstruction technique. COMPARISON:  None Available. FINDINGS: CT HEAD FINDINGS Brain: No evidence of acute infarction, hemorrhage, hydrocephalus, extra-axial  collection or mass lesion/mass effect. Sequela of moderate chronic microvascular ischemic change. Vascular: No hyperdense vessel or unexpected calcification. Skull: Normal. Negative for fracture or focal lesion. Sinuses/Orbits: No middle ear or mastoid  effusion. Paranasal sinuses are clear. Bilateral lens replacement. Orbits are otherwise unremarkable. Other: None. CT CERVICAL SPINE FINDINGS Alignment: Grade 1 anterolisthesis of C3 on C4 and C4 on C5. Trace retrolisthesis of C5 on C6. Skull base and vertebrae: No acute fracture. No primary bone lesion or focal pathologic process. Soft tissues and spinal canal: No prevertebral fluid or swelling. No visible canal hematoma. Disc levels:  No evidence of high-grade spinal canal stenosis. Upper chest: Clustered nodularity in the right upper lobe is worrisome for an underlying infectious or inflammatory process. There is a small right-sided pleural effusion. Recommend further evaluation with a dedicated chest CT for more definitive characterization. Other: None IMPRESSION: 1. No acute intracranial abnormality. 2. No acute fracture or traumatic malalignment of the cervical spine. 3. Clustered nodularity in the right upper lobe is worrisome for an underlying infectious or inflammatory process. There is a small right-sided pleural effusion. Recommend further evaluation with a dedicated chest CT for more definitive characterization. Electronically Signed   By: Lorenza Cambridge M.D.   On: 11/11/2022 15:10   CT Cervical Spine Wo Contrast  Result Date: 11/03/2022 CLINICAL DATA:  Mental status change, unknown cause; Polytrauma, blunt EXAM: CT HEAD WITHOUT CONTRAST CT CERVICAL SPINE WITHOUT CONTRAST TECHNIQUE: Multidetector CT imaging of the head and cervical spine was performed following the standard protocol without intravenous contrast. Multiplanar CT image reconstructions of the cervical spine were also generated. RADIATION DOSE REDUCTION: This exam was performed according to the departmental dose-optimization program which includes automated exposure control, adjustment of the mA and/or kV according to patient size and/or use of iterative reconstruction technique. COMPARISON:  None Available. FINDINGS: CT HEAD FINDINGS  Brain: No evidence of acute infarction, hemorrhage, hydrocephalus, extra-axial collection or mass lesion/mass effect. Sequela of moderate chronic microvascular ischemic change. Vascular: No hyperdense vessel or unexpected calcification. Skull: Normal. Negative for fracture or focal lesion. Sinuses/Orbits: No middle ear or mastoid effusion. Paranasal sinuses are clear. Bilateral lens replacement. Orbits are otherwise unremarkable. Other: None. CT CERVICAL SPINE FINDINGS Alignment: Grade 1 anterolisthesis of C3 on C4 and C4 on C5. Trace retrolisthesis of C5 on C6. Skull base and vertebrae: No acute fracture. No primary bone lesion or focal pathologic process. Soft tissues and spinal canal: No prevertebral fluid or swelling. No visible canal hematoma. Disc levels:  No evidence of high-grade spinal canal stenosis. Upper chest: Clustered nodularity in the right upper lobe is worrisome for an underlying infectious or inflammatory process. There is a small right-sided pleural effusion. Recommend further evaluation with a dedicated chest CT for more definitive characterization. Other: None IMPRESSION: 1. No acute intracranial abnormality. 2. No acute fracture or traumatic malalignment of the cervical spine. 3. Clustered nodularity in the right upper lobe is worrisome for an underlying infectious or inflammatory process. There is a small right-sided pleural effusion. Recommend further evaluation with a dedicated chest CT for more definitive characterization. Electronically Signed   By: Lorenza Cambridge M.D.   On: 11/11/2022 15:10   DG Chest Port 1 View  Result Date: 11/28/2022 CLINICAL DATA:  Questionable sepsis - evaluate for abnormality EXAM: PORTABLE CHEST 1 VIEW COMPARISON:  CXR 06/19/20 FINDINGS: Asymmetric elevation of the right hemidiaphragm. No pleural effusion. There is a hazy opacity at the right lung base, which could represent atelectasis or infection. No radiographically apparent  displaced rib fractures.  Visualized upper abdomen is unremarkable. Aortic atherosclerotic calcifications. IMPRESSION: Hazy opacity at the right lung base could represent atelectasis or infection. Electronically Signed   By: Lorenza Cambridge M.D.   On: 11/17/2022 14:08   Medications:  ampicillin-sulbactam (UNASYN) IV 3 g (11/11/22 1103)    apixaban  2.5 mg Oral BID   carbidopa-levodopa  2 tablet Oral TID   ipratropium-albuterol  3 mL Nebulization BID    Assessment/Plan: Acute-on-Chronic Kidney Disease: SCr and BUN slightly improved since yesterday, his baseline is in the high 1's-low 2's. Seen by Dr. Marisue Humble outpatient. Volume status remains a concern, as legs are extremely swollen, although this may improve once patient is no longer bedbound. I would like to order another dose of lasix at this point, but will defer to MD's judgement. Dialysis not desired by patient or family. Will continue to trend renal panel. Monitor for hypotension or respiratory changes. Continue renal diet as tolerated, 1200 mL fluid restriction, daily weights. Will follow. Acute Hypoxic Resp. Failure: Appears resolved. He is on 4L [place] O2 satting in high 90's when seen, and has no issues breathing. Remains on Abx for possible pneumonia, afebrile when seen. No real changes in CXRs. Seen by PCCM.  AMS: Likely due to hx of parkinsons, nuplazid toxicity, and progressive cognitive decline. Unable to provide any response to my questions, follows some commands as part of Physical Exam. Currently in mittens. He is off the offending med and being managed by the hospital medicine and neurology services. Metabolic Alkalosis: Elevated bicarb with borderline alkalotic arterial pH yesterday suggestive of metabolic alkalosis. pCO2 at the time was 52 suggesting partial respiratory compensation. Continue to monitor HCO3- and pH levels. Monitor for symptomatic changes or respiratory changes. Anemia: Continue to trend CBCs  Santiago Bumpers, PA-S2 11/11/2022, 11:08 AM   [facility]

## 2022-11-11 NOTE — Progress Notes (Addendum)
0745 patient alert to self and year doesn't follow all commands unable to make all needs known on [place] 4 liters bilateral safety mitts on patient still able to get gown ands 02NC off 1300 patient coughing with potato soup so that was not provided anymore, patient took 40 minutes to eat fruit cup and 5 spoons of food then began to spit food at me patients significant other is at bedside insistent that he he keeps eating 1745 patient spitting food at NT while she is trying to feeding him patients significant other is at bedside

## 2022-11-11 NOTE — Plan of Care (Signed)
  Problem: Education: Goal: Knowledge of General Education information will improve Description: Including pain rating scale, medication(s)/side effects and non-pharmacologic comfort measures Outcome: Not Progressing   Problem: Health Behavior/Discharge Planning: Goal: Ability to manage health-related needs will improve Outcome: Not Progressing   Problem: Clinical Measurements: Goal: Ability to maintain clinical measurements within normal limits will improve Outcome: Not Progressing Goal: Will remain free from infection Outcome: Not Progressing Goal: Diagnostic test results will improve Outcome: Not Progressing Goal: Respiratory complications will improve Outcome: Not Progressing Goal: Cardiovascular complication will be avoided Outcome: Not Progressing   Problem: Activity: Goal: Risk for activity intolerance will decrease Outcome: Not Progressing   Problem: Nutrition: Goal: Adequate nutrition will be maintained Outcome: Progressing Note: Patient ate some lunch today   Problem: Coping: Goal: Level of anxiety will decrease Outcome: Not Progressing   Problem: Elimination: Goal: Will not experience complications related to bowel motility Outcome: Not Progressing Goal: Will not experience complications related to urinary retention Outcome: Not Progressing   Problem: Pain Managment: Goal: General experience of comfort will improve Outcome: Progressing   Problem: Safety: Goal: Ability to remain free from injury will improve Outcome: Not Progressing   Problem: Skin Integrity: Goal: Risk for impaired skin integrity will decrease Outcome: Not Progressing

## 2022-11-11 NOTE — Progress Notes (Signed)
RE: [name] Date of Birth: 2035-11-29 Date: 11/11/22  Please be advised that the above-named patient will require a short-term nursing home stay - anticipated 30 days or less for rehabilitation and strengthening.  The plan is for return home.

## 2022-11-11 NOTE — NC FL2 (Cosign Needed Addendum)
[place] MEDICAID FL2 LEVEL OF CARE FORM     IDENTIFICATION  Patient Name: [name] Birthdate: 19-Dec-1935 Sex: male Admission Date (Current Location): 11/27/2022  [facility] and [place] Number:  Producer, television/film/video and Address:  [facility] [place], [address] [place], [place] [address]      Provider Number: [id number]  Attending Physician Name and Address:  Leroy Sea, MD  Relative Name and Phone Number:       Current Level of Care: Hospital Recommended Level of Care: Skilled Nursing Facility Prior Approval Number:    Date Approved/Denied:   PASRR Number: [id number]  Discharge Plan: SNF    Current Diagnoses: Patient Active Problem List   Diagnosis Date Noted   Acute respiratory failure with hypoxia (HCC) 11/10/2022   Encephalopathy acute 11/10/2022   Hypervolemia 11/10/2022   Metabolic encephalopathy 11/10/2022   AMS (altered mental status) 11/03/2022   Fall 06/21/2022   Elevated troponin 06/21/2022   Lymphedema 06/21/2022   Chronic heart failure with preserved ejection fraction (HCC) 06/17/2020   Elevated hemidiaphragm    Closed fracture of left femur (HCC)    Pain    Hypokalemia 05/19/2020   Dilated cardiomyopathy (HCC) 05/19/2020   CKD (chronic kidney disease), stage III (HCC) 05/19/2020   Parkinson's disease 05/19/2020   Closed left hip fracture (HCC) 05/18/2020   Intertrochanteric fracture (HCC) 05/18/2020   Essential hypertension 07/21/2018   Bilateral leg edema 07/21/2018   Laboratory examination 05/14/2018   Pulmonary hypertension (HCC) 03/16/2018   Nonrheumatic mitral valve regurgitation 03/16/2018   Tricuspid regurgitation 03/16/2018   CKD (chronic kidney disease) stage 3, GFR 30-59 ml/min (HCC) 09/26/2017   Chronic anticoagulation 09/26/2017   Atrial fibrillation (HCC) 09/26/2017   AKI (acute kidney injury) (HCC) 09/21/2017   Atrial flutter (HCC) 02/11/2017   Post PTCA 08/09/2016   Dyspnea on  exertion 08/07/2016   Coronary artery disease involving native coronary artery of native heart without angina pectoris 08/07/2016    Orientation RESPIRATION BLADDER Height & Weight     Self  O2 (2L nasal cannula) Incontinent, External catheter Weight: 192 lb 14.4 oz (87.5 kg) Height:  5\' 10"  (177.8 cm)  BEHAVIORAL SYMPTOMS/MOOD NEUROLOGICAL BOWEL NUTRITION STATUS      Continent Diet (See dc summary)  AMBULATORY STATUS COMMUNICATION OF NEEDS Skin   Extensive Assist Verbally Normal                       Personal Care Assistance Level of Assistance  Bathing, Feeding, Dressing Bathing Assistance: Maximum assistance Feeding assistance: Limited assistance Dressing Assistance: Maximum assistance     Functional Limitations Info             SPECIAL CARE FACTORS FREQUENCY  PT (By licensed PT), OT (By licensed OT), Speech therapy     PT Frequency: 5x/week OT Frequency: 5x/week     Speech Therapy Frequency: 2x/week      Contractures Contractures Info: Not present    Additional Factors Info  Code Status, Allergies Code Status Info: DNR Allergies Info: Nsaids, Nuplazid (Pimavanserin Tartrate           Current Medications (11/11/2022):  This is the current hospital active medication list Current Facility-Administered Medications  Medication Dose Route Frequency Provider Last Rate Last Admin   acetaminophen (TYLENOL) tablet 650 mg  650 mg Oral Q6H PRN Meredeth Ide, MD       Or   acetaminophen (TYLENOL) suppository 650 mg  650 mg Rectal  Q6H PRN Meredeth Ide, MD       Ampicillin-Sulbactam (UNASYN) 3 g in sodium chloride 0.9 % 100 mL IVPB  3 g Intravenous Q12H Susa Raring K, MD 200 mL/hr at 11/11/22 1103 3 g at 11/11/22 1103   apixaban (ELIQUIS) tablet 2.5 mg  2.5 mg Oral BID Meredeth Ide, MD   2.5 mg at 11/11/22 1100   carbidopa-levodopa (SINEMET IR) 25-100 MG per tablet immediate release 2 tablet  2 tablet Oral TID Meredeth Ide, MD   2 tablet at 11/11/22 1059    ipratropium-albuterol (DUONEB) 0.5-2.5 (3) MG/3ML nebulizer solution 3 mL  3 mL Nebulization BID Leroy Sea, MD       ipratropium-albuterol (DUONEB) 0.5-2.5 (3) MG/3ML nebulizer solution 3 mL  3 mL Nebulization Q4H PRN Leroy Sea, MD       ondansetron (ZOFRAN) tablet 4 mg  4 mg Oral Q6H PRN Meredeth Ide, MD       Or   ondansetron (ZOFRAN) injection 4 mg  4 mg Intravenous Q6H PRN Meredeth Ide, MD         Discharge Medications: Please see discharge summary for a list of discharge medications.  Relevant Imaging Results:  Relevant Lab Results:   Additional Information SSn: [id number] [place], [place]

## 2022-11-11 NOTE — TOC Initial Note (Addendum)
Transition of Care [facility]) - Initial/Assessment Note    Patient Details  Name: [name] MRN: [id number] Date of Birth: 12-01-1935  Transition of [facility]) CM/SW Contact:    Mearl Latin, LCSW Phone Number: 11/11/2022, 2:16 PM  Clinical Narrative:                 CSW received consult for possible SNF placement at time of discharge. CSW spoke with patient's significant other and caregiver, [name]. She reported that she is currently unable to care for patient at their home given patient's current physical needs and fall risk. Patient had home health therapy with [facility]. She expressed understanding of PT recommendation and is agreeable to SNF placement at time of discharge with preference for [facility], [facility] or [facility] as they are close to home. CSW discussed insurance authorization process and will provide Medicare SNF ratings list. CSW will send out referrals for review and provide bed offers as available. Pasrr pending and documents were uploaded to [place] [employer].   Update: pasrr received and placed on Fl2.   Skilled Nursing Rehab Facilities-   [email or web address]   Ratings out of 5 stars (5 the highest)   Name Address  Phone # Quality Care Staffing Health Inspection Overall  [facility] [address] [phone] 2 1 5 4   [facility] [address] [place] [phone] 4 1 3 2   [facility] [address], [place]inette Otto [phone] 2 1 1 1   [facility] [address], [place] [phone] 4 1 3 2   [facility]  [address], [place] [phone] 3 2 5 5   [facility] [address] [facility] [phone] 2 1 2 1   [facility] [address] [place] [phone] 4 1 2 1   [facility] [address] [place] [phone] 2 4 3 3   [address] ([place]) [address], [place] [phone] 3 2 2 2   [facility] [address] [place], [place] [phone] 1 2 1 1   [facility] [address], [place] [phone] 3 1 1 1   [facility]igh[facility]ideg[facility] [address] Liliane [place] [phone] 4 3 4 4   [facility] [address] [place] [phone] 3 4 3 3           [facility] [address] [place] [phone]      [facility] EX[facility]ZJIRCV, [place] [address], [place] [phone] 1 1 2 1   [facility] [address], [employer] [phone] 2 2 4 4   [facility] [place] [address] [phone] 2 1 4 3   [facility] [address] [place] [phone] 3 3 3 3           [address] (no [facility]) [address], [place] [phone] 4 4 5 5   [facility] (No [employer]) [address] [place] [phone] 2 2 4 4   [facility] [address], [place] [phone] 2 1 2 1   [facility] (No [employer]) [address], [place] [phone] 5 5 5 5   [facility] [address] [facility] [phone] 2 3 5 5   [facility] [address] [place] [phone] 2 1 1 1   [facility] [address] Liliane Shi [phone] 5 2 5 5   [facility]  [address] [place] [phone] 2 2 2 2   [facility] [address] [place] [phone] 4 2 1 1   [facility] [address], [employer]  [phone] 2 2 3 3           [facility] [address] [place] [phone] 1 1 1 1   [facility] [address] Evlyn Clines  [phone] 2 3 3 3   [facility] (No Humana) [address], [place] [phone] 2 1 3 2   [facility]) [address], Rosalita Levan [phone] 1 1 1 1   [facility] [address], [place] Levan [phone] 3 2 5 5   [facility] [address], [place] [phone] 2 1 1 1           [facility] [address] [place], [place] [phone] 4 4 5 5   [facility])  [address] [place]  [phone] 2 1 2 1   [facility]) [address], [place] [phone]  1 4 3   [facility] [address], [place] [phone] 3 5 4 5   [address] [place], [place] [phone] 3 2 2 2   [facility]) [address] [place] [phone] 2 1 3 2      Expected Discharge Plan: Skilled Nursing Facility Barriers to Discharge: Continued Medical Work up, SNF Pending bed offer   Patient Goals and CMS Choice Patient states their goals for this hospitalization and ongoing recovery are:: Rehab CMS Medicare.gov Compare Post Acute Care list provided to:: Patient Represenative (must comment) Choice offered to / list presented to :  (Significant other/caregiver) [employer] ownership interest in [facility].provided to::  (Sig other)    Expected Discharge Plan and Services In-house Referral: Clinical Social Work   Post Acute Care Choice: Skilled Nursing Facility Living arrangements for the past 2 months: Single Family Home                                      Prior Living Arrangements/Services Living arrangements for the past 2 months: Single Family Home Lives with:: Significant Other Patient language and need for interpreter reviewed:: Yes Do you feel safe going back to the place where you live?: Yes      Need for Family Participation in Patient Care: Yes (Comment) Care giver support system in place?: Yes (comment) Current home services: DME, Home PT, Home RN Criminal Activity/Legal Involvement Pertinent to Current Situation/Hospitalization: No - Comment as needed  Activities of Daily Living Home Assistive Devices/Equipment: Other (Comment), Wheelchair, Bedside commode/3-in-1, Blood pressure cuff, Walker (specify type), Shower chair with back, Cane (specify quad or straight), Eyeglasses, Grab bars around toilet, Hand-held shower hose, Long-handled shoehorn, Long-handled sponge, Nebulizer, Raised toilet  seat with rails, Scales (lift chair,lymph edema pumps,straight  and quad cane,) ADL Screening (condition at time of admission) Patient's cognitive ability adequate to safely complete daily activities?: No Is the patient deaf or have difficulty hearing?: Yes Does the patient have difficulty seeing, even when wearing glasses/contacts?: 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) Does the patient have difficulty concentrating, remembering, or making decisions?: Yes Patient able to express need for assistance with ADLs?: No Does the patient have difficulty dressing or bathing?: Yes Independently performs ADLs?: No Communication: Independent Dressing (OT): Needs assistance Is this a change from baseline?: Pre-admission baseline Grooming: Needs assistance Is this a change from baseline?: Pre-admission baseline Feeding: Needs assistance Is this a change from baseline?: Pre-admission baseline Bathing: Needs assistance Is this a change from baseline?: Pre-admission baseline Toileting: Needs assistance Is this a change from baseline?:  Pre-admission baseline In/Out Bed: Dependent Is this a change from baseline?: Pre-admission baseline Walks in Home: Dependent Is this a change from baseline?: Pre-admission baseline Does the patient have difficulty walking or climbing stairs?: Yes Weakness of Legs: Both Weakness of Arms/Hands: Both  Permission Sought/Granted Permission sought to share information with : Facility Medical sales representative, Family Supports Permission granted to share information with : No  Share Information with NAME: [name]  Permission granted to share info w AGENCY: SNFs  Permission granted to share info w Relationship: Significant other  Permission granted to share info w Contact Information: [phone]  Emotional Assessment Appearance:: Appears stated age Attitude/Demeanor/Rapport: Unable to Assess Affect (typically observed): Unable to Assess Orientation: : Oriented to Self Alcohol /  Substance Use: Not Applicable Psych Involvement: No (comment)  Admission diagnosis:  AMS (altered mental status) [R41.82] Patient Active Problem List   Diagnosis Date Noted   Acute respiratory failure with hypoxia (HCC) 11/10/2022   Encephalopathy acute 11/10/2022   Hypervolemia 11/10/2022   Metabolic encephalopathy 11/10/2022   AMS (altered mental status) 11/14/22   Fall 06/21/2022   Elevated troponin 06/21/2022   Lymphedema 06/21/2022   Chronic heart failure with preserved ejection fraction (HCC) 06/17/2020   Elevated hemidiaphragm    Closed fracture of left femur (HCC)    Pain    Hypokalemia 05/19/2020   Dilated cardiomyopathy (HCC) 05/19/2020   CKD (chronic kidney disease), stage III (HCC) 05/19/2020   Parkinson's disease 05/19/2020   Closed left hip fracture (HCC) 05/18/2020   Intertrochanteric fracture (HCC) 05/18/2020   Essential hypertension 07/21/2018   Bilateral leg edema 07/21/2018   Laboratory examination 05/14/2018   Pulmonary hypertension (HCC) 03/16/2018   Nonrheumatic mitral valve regurgitation 03/16/2018   Tricuspid regurgitation 03/16/2018   CKD (chronic kidney disease) stage 3, GFR 30-59 ml/min (HCC) 09/26/2017   Chronic anticoagulation 09/26/2017   Atrial fibrillation (HCC) 09/26/2017   AKI (acute kidney injury) (HCC) 09/21/2017   Atrial flutter (HCC) 02/11/2017   Post PTCA 08/09/2016   Dyspnea on exertion 08/07/2016   Coronary artery disease involving native coronary artery of native heart without angina pectoris 08/07/2016   PCP:  Merri Brunette, MD Pharmacy:   [facility] [address] [place] - [address] [place] [address] Phone: [phone] Fax: [phone]  [facility] - [place], [place] - [address] [place] [address] Phone: [phone] Fax: [phone]  [facility] [place] - [address] [place] [address] Phone: [phone] Fax: [phone]     Social Determinants of Health (SDOH) Social History: SDOH Screenings   Food Insecurity: No Food Insecurity (11-14-2022)  Housing: Low Risk  (11-14-22)  Transportation Needs: No Transportation Needs (November 14, 2022)  Utilities: Not At Risk (November 14, 2022)  Tobacco Use: Low Risk  (11-14-22)   SDOH Interventions:     Readmission Risk Interventions    05/22/2020   11:48 AM  Readmission Risk Prevention Plan  Transportation Screening Complete  PCP or Specialist Appt within 5-7 Days Complete  Home Care Screening Complete  Medication Review (RN CM) Complete

## 2022-11-11 NOTE — Evaluation (Signed)
Physical Therapy Evaluation Patient Details Name: [name] MRN: [id number] DOB: 12/24/1935 Today's Date: 11/11/2022  History of Present Illness  Pt is 87 yo presenting to [facility] ER with hypoxic respiratory failure, lethargy and questionable pneumonia. Pt was placed on BiPAP on 12/04/2022 and was removed from BiPAP the evening of 9/12. PMH: Parkinsons's, CKD, HTN, A fib and is w/c bound at baseline. Pt partner is caregiver.  Clinical Impression  Per previous chart from last hospital stay ~ 4 months ago pt is below baseline level of functioning. Pt requires heavy 50% initiation in order to facilitate movement of LE toward EOB with constant re-direction for functional activities. Pt was heavy Max A for bed mobility and unable to stand EOB with Max A. Pt demonstrates no evidence of initiation with sit to stand despite heavy multi modal cueing. Due to pt current functional status, home set up and available assistance at home recommending skilled physical therapy services < 3 hours/day in order to decrease risk for falls, injury, and re-hospitalization as well as decreasing burden of care on caregiver.         If plan is discharge home, recommend the following: Two people to help with walking and/or transfers;Assist for transportation;Assistance with cooking/housework;Help with stairs or ramp for entrance   Can travel by private vehicle   No    Equipment Recommendations Wheelchair (measurements PT);Wheelchair cushion (measurements PT);Hospital bed;Hoyer lift  Recommendations for Other Services  OT consult    Functional Status Assessment Patient has had a recent decline in their functional status and demonstrates the ability to make significant improvements in function in a reasonable and predictable amount of time.     Precautions / Restrictions Precautions Precautions: Fall Restrictions Weight Bearing Restrictions: No      Mobility  Bed Mobility Overal bed mobility: Needs  Assistance Bed Mobility: Supine to Sit, Sit to Supine     Supine to sit: Max assist, HOB elevated Sit to supine: Max assist, HOB elevated   General bed mobility comments: Max A for trunk with supine to sitting and bil LE for sitting to supine    Transfers     General transfer comment: Pt was unable to get to standing with elevated EOB and Max A. Poor body mechanics and no evidence of pt assistance.    Ambulation/Gait   General Gait Details: Unable at this time.       Balance Overall balance assessment: Needs assistance Sitting-balance support: Bilateral upper extremity supported, Single extremity supported, No upper extremity supported Sitting balance-Leahy Scale: Fair Sitting balance - Comments: Able to sit EOB without LOB CGA to SBA         Pertinent Vitals/Pain Pain Assessment Pain Assessment: Faces Faces Pain Scale: No hurt Breathing: normal Negative Vocalization: none Facial Expression: smiling or inexpressive Body Language: relaxed Consolability: no need to console PAINAD Score: 0    Home Living Family/patient expects to be discharged to:: Private residence Living Arrangements: Spouse/significant other Available Help at Discharge: Family;Available 24 hours/day Type of Home: House Home Access: Stairs to enter Entrance Stairs-Rails: None Entrance Stairs-Number of Steps: 1   Home Layout: One level Home Equipment: Teacher, English as a foreign language (2 wheels);Wheelchair - manual;Grab bars - toilet;Cane - quad;Cane - single point;Hand held shower head Additional Comments: per prior admission pt slept in lift chair    Prior Function Prior Level of Function : Needs assist;History of Falls (last six months)             Mobility Comments: per last admission -  Pt reported ambulating short distances around house with RW and using WC out in community ADLs Comments: at last admission pt was independent with ADL's.     Extremity/Trunk Assessment   Upper Extremity  Assessment Upper Extremity Assessment: Defer to OT evaluation    Lower Extremity Assessment Lower Extremity Assessment: Generalized weakness    Cervical / Trunk Assessment Cervical / Trunk Assessment: Kyphotic  Communication   Communication Communication: Difficulty following commands/understanding;Difficulty communicating thoughts/reduced clarity of speech Following commands: Follows one step commands inconsistently Cueing Techniques: Verbal cues;Gestural cues;Tactile cues;Visual cues  Cognition Arousal: Lethargic Behavior During Therapy: Flat affect Overall Cognitive Status: Impaired/Different from baseline Area of Impairment: Awareness, Following commands       Following Commands: Follows one step commands inconsistently       General Comments: Unsure of pt baseline cognition. Attempted to contact partner and unable to get on the phone.        General Comments General comments (skin integrity, edema, etc.): Pt intermittently would answer questions correctly.        Assessment/Plan    PT Assessment Patient needs continued PT services  PT Problem List Decreased strength;Decreased mobility;Decreased balance;Decreased cognition;Decreased safety awareness       PT Treatment Interventions DME instruction;Therapeutic exercise;Gait training;Balance training;Stair training;Functional mobility training;Neuromuscular re-education;Cognitive remediation;Therapeutic activities;Patient/family education    PT Goals (Current goals can be found in the Care Plan section)  Acute Rehab PT Goals PT Goal Formulation: Patient unable to participate in goal setting Time For Goal Achievement: 11/25/22    Frequency Min 1X/week        AM-PAC PT "6 Clicks" Mobility  Outcome Measure Help needed turning from your back to your side while in a flat bed without using bedrails?: A Lot Help needed moving from lying on your back to sitting on the side of a flat bed without using bedrails?: A  Lot Help needed moving to and from a bed to a chair (including a wheelchair)?: Total Help needed standing up from a chair using your arms (e.g., wheelchair or bedside chair)?: Total Help needed to walk in hospital room?: Total Help needed climbing 3-5 steps with a railing? : Total 6 Click Score: 8    End of Session Equipment Utilized During Treatment: Gait belt Activity Tolerance: Other (comment) (limited by cognition) Patient left: in bed;with call bell/phone within reach;with bed alarm set Nurse Communication: Mobility status PT Visit Diagnosis: Other abnormalities of gait and mobility (R26.89)    Time: 9629-5284 PT Time Calculation (min) (ACUTE ONLY): 23 min   Charges:   PT Evaluation $PT Eval Low Complexity: 1 Low PT Treatments $Therapeutic Activity: 8-22 mins PT General Charges $$ ACUTE PT VISIT: 1 Visit         Harrel Carina, DPT, CLT  Acute Rehabilitation Services Office: [phone] (Secure chat preferred)   Claudia Desanctis 11/11/2022, 10:14 AM

## 2022-11-12 ENCOUNTER — Inpatient Hospital Stay (HOSPITAL_COMMUNITY): Payer: Medicare Other

## 2022-11-12 DIAGNOSIS — R4182 Altered mental status, unspecified: Secondary | ICD-10-CM | POA: Diagnosis not present

## 2022-11-12 LAB — CBC WITH DIFFERENTIAL/PLATELET
Abs Immature Granulocytes: 0.02 10*3/uL (ref 0.00–0.07)
Basophils Absolute: 0 10*3/uL (ref 0.0–0.1)
Basophils Relative: 1 %
Eosinophils Absolute: 0.1 10*3/uL (ref 0.0–0.5)
Eosinophils Relative: 2 %
HCT: 38.2 % — ABNORMAL LOW (ref 39.0–52.0)
Hemoglobin: 11.3 g/dL — ABNORMAL LOW (ref 13.0–17.0)
Immature Granulocytes: 1 %
Lymphocytes Relative: 12 %
Lymphs Abs: 0.5 10*3/uL — ABNORMAL LOW (ref 0.7–4.0)
MCH: 27 pg (ref 26.0–34.0)
MCHC: 29.6 g/dL — ABNORMAL LOW (ref 30.0–36.0)
MCV: 91.4 fL (ref 80.0–100.0)
Monocytes Absolute: 0.6 10*3/uL (ref 0.1–1.0)
Monocytes Relative: 13 %
Neutro Abs: 3.1 10*3/uL (ref 1.7–7.7)
Neutrophils Relative %: 71 %
Platelets: 113 10*3/uL — ABNORMAL LOW (ref 150–400)
RBC: 4.18 MIL/uL — ABNORMAL LOW (ref 4.22–5.81)
RDW: 18.2 % — ABNORMAL HIGH (ref 11.5–15.5)
WBC: 4.4 10*3/uL (ref 4.0–10.5)
nRBC: 0 % (ref 0.0–0.2)

## 2022-11-12 LAB — BRAIN NATRIURETIC PEPTIDE: B Natriuretic Peptide: 679 pg/mL — ABNORMAL HIGH (ref 0.0–100.0)

## 2022-11-12 LAB — C-REACTIVE PROTEIN: CRP: 1.3 mg/dL — ABNORMAL HIGH (ref <1.0)

## 2022-11-12 LAB — COMPREHENSIVE METABOLIC PANEL
ALT: 5 U/L (ref 0–44)
AST: 15 U/L (ref 15–41)
Albumin: 2.8 g/dL — ABNORMAL LOW (ref 3.5–5.0)
Alkaline Phosphatase: 127 U/L — ABNORMAL HIGH (ref 38–126)
Anion gap: 12 (ref 5–15)
BUN: 45 mg/dL — ABNORMAL HIGH (ref 8–23)
CO2: 37 mmol/L — ABNORMAL HIGH (ref 22–32)
Calcium: 9.3 mg/dL (ref 8.9–10.3)
Chloride: 102 mmol/L (ref 98–111)
Creatinine, Ser: 2.48 mg/dL — ABNORMAL HIGH (ref 0.61–1.24)
GFR, Estimated: 25 mL/min — ABNORMAL LOW (ref >60)
Glucose, Bld: 107 mg/dL — ABNORMAL HIGH (ref 70–99)
Potassium: 3.4 mmol/L — ABNORMAL LOW (ref 3.5–5.1)
Sodium: 151 mmol/L — ABNORMAL HIGH (ref 135–145)
Total Bilirubin: 2.4 mg/dL — ABNORMAL HIGH (ref 0.3–1.2)
Total Protein: 6.6 g/dL (ref 6.5–8.1)

## 2022-11-12 LAB — MAGNESIUM: Magnesium: 2.4 mg/dL (ref 1.7–2.4)

## 2022-11-12 LAB — PROCALCITONIN: Procalcitonin: 0.16 ng/mL

## 2022-11-12 MED ORDER — POTASSIUM CHLORIDE 20 MEQ PO PACK
40.0000 meq | PACK | Freq: Once | ORAL | Status: AC
  Administered 2022-11-12: 40 meq via ORAL
  Filled 2022-11-12: qty 2

## 2022-11-12 MED ORDER — POTASSIUM CL IN DEXTROSE 5% 20 MEQ/L IV SOLN
20.0000 meq | INTRAVENOUS | Status: AC
  Administered 2022-11-12 – 2022-11-13 (×3): 20 meq via INTRAVENOUS
  Filled 2022-11-12 (×3): qty 1000

## 2022-11-12 MED ORDER — PROSOURCE PLUS PO LIQD
30.0000 mL | Freq: Two times a day (BID) | ORAL | Status: DC
  Administered 2022-11-12 – 2022-11-14 (×5): 30 mL via ORAL
  Filled 2022-11-12 (×5): qty 30

## 2022-11-12 MED ORDER — BOOST PLUS PO LIQD
237.0000 mL | Freq: Three times a day (TID) | ORAL | Status: DC
  Administered 2022-11-12 – 2022-11-14 (×7): 237 mL via ORAL
  Filled 2022-11-12 (×14): qty 237

## 2022-11-12 NOTE — Plan of Care (Signed)

## 2022-11-12 NOTE — Progress Notes (Signed)
PROGRESS NOTE                                                                                                                                                                                                             Patient Demographics:    Tradarius Mobilia, is a 87 y.o. male, DOB - May 11, 1935, MVH:[id number]  Outpatient Primary MD for the patient is Merri Brunette, MD    LOS - 3  Admit date - 11-24-2022    Chief Complaint  Patient presents with   Failure To Thrive       Brief Narrative (HPI from H&P)     87 year old male with history of Parkinson's disease, pulmonary hypertension, dilated cardiomyopathy, CAD, CKD 3B baseline creatinine around 1.6 who was recently started on nuplacid for his Parkinson's about 10 days ago, he took 6 doses of that medication last being on 11/07/2022.  Per caregiver since the start of this medication he has been getting increasingly weak and confused, he is usually wheelchair-bound and cannot walk a little bit but became increasingly more lethargic and short of breath.    Brought to the ER where he was found to have hypoxic respiratory failure, lethargy, CT chest with questionable pneumonia, CT head nonacute, initially required nasal cannula oxygen however overnight became more hypoxic requiring BiPAP with 100% FiO2, transferred to my care on 11/10/2022.  Currently he is obtunded, on BiPAP with 100% FiO2.  Exam reveals 3+ leg edema and crackles, stable WBC, negative procalcitonin and ABG suggests severe hypoxic respiratory failure.   Subjective:   Patient in bed, denies any headache or chest pain, no shortness of breath, generalized weakness.   Assessment  & Plan :    Acute hypoxic respiratory failure despite being on CPAP with initial FiO2 30% and IPAP 30. His exam reveals massive fluid overload, bibasilar crackles, CT chest is of possible pneumonia however no fever, no white count no  procalcitonin.  He was placed on BiPAP, diuresed with IV Lasix, antibiotics titrated down, seen by PCCM.  He is DNR, continue medical treatment, significant improvement on 11/11/2022, continue as needed Lasix, continue present antibiotics, supplemental oxygen, I-S flutter valve for pulmonary toiletry and monitor.  Seen by pulmonary this admission as well.  Worsening encephalopathy.  Due to combination of hypoxia now, underlying Parkinson's and recent exposure to Nuplacid - CT head  unremarkable, no focal deficits, holding Nuplazid, oxygen as above, stable EEG, with offending medication outpatient improving.  Neurology on board.  HX of Parkinson's.  Currently continuing home medication Sinemet, PT OT and speech eval, neurology on board.  AKI on CKD 3B with hypernatremia.  Baseline creatinine 1.6, AKI in the setting of massive fluid overload, however due to poor oral intake and protein calorie malnutrition most of the fluid is third spaced and he is intravascularly getting dehydrated, oral protein supplementation, poor candidate for NG tube or PEG tube, diuretics as tolerated, nephrology on board.  Encouraged free water intake.  Severe PCM.  Protein supplements.    Paroxysmal atrial fibrillation [place] vas 2 score greater than 3.  On Eliquis, continue as tolerated, low-dose beta-blocker scheduled oral and IV and as needed.  Massive lower extremity edema.  Diurese.  Nonspecific CT cervical spine changes-CT cervical spine showed grade 1 anterolisthesis of C3 on C4 and C4 and C5, trace retrolisthesis of C5 on C6. [name]  Outpatient neurosurgery follow-up.  No neck pain.      Condition - Extremely Guarded  Family Communication  : Family decision maker girlfriend [name] [phone] on 11/10/2022, message left 11/11/2022 9:30 AM, called 11/12/2022 at 8:45 AM, going to answering machine, x 3  Code Status :  DNR-I  Consults  : PCCM, neurology, nephrology, palliative care  PUD Prophylaxis :     Procedures  :      CT Chest - 1. Chronic elevation of right diaphragm. 2. Small right greater than left pleural effusions. 3. Consolidation with air bronchograms in the right lower lobe, slightly progressive compared to the lung bases on prior CT. Heterogeneous consolidation and ground-glass density in the right upper lobe, findings are suspicious for pneumonia. 4. Aortic atherosclerosis. Aortic Atherosclerosis (ICD10-I70.0).  CT head and C Spine - 1. No acute intracranial abnormality. 2. No acute fracture or traumatic malalignment of the cervical spine. 3. Clustered nodularity in the right upper lobe is worrisome for an underlying infectious or inflammatory process. There is a small right-sided pleural effusion. Recommend further evaluation with a dedicated chest CT for more definitive characterization.      Disposition Plan  :    Status is: Inpatient  DVT Prophylaxis  :    apixaban (ELIQUIS) tablet 2.5 mg Start: 30-Nov-2022 2200 apixaban (ELIQUIS) tablet 2.5 mg     Lab Results  Component Value Date   PLT 113 (L) 11/12/2022    Diet :  Diet Order             DIET DYS 3 Room service appropriate? No; Fluid consistency: Thin  Diet effective now                    Inpatient Medications  Scheduled Meds:  apixaban  2.5 mg Oral BID   carbidopa-levodopa  2 tablet Oral TID   feeding supplement  237 mL Oral BID BM   ipratropium-albuterol  3 mL Nebulization BID   Continuous Infusions:  ampicillin-sulbactam (UNASYN) IV 3 g (11/12/22 0840)   dextrose 5 % with KCl 20 mEq / L 125 mL/hr at 11/12/22 0606   PRN Meds:.acetaminophen **OR** acetaminophen, ipratropium-albuterol, ondansetron **OR** ondansetron (ZOFRAN) IV    Objective:   Vitals:   11/11/22 2335 11/12/22 0321 11/12/22 0800 11/12/22 0819  BP: 132/67 (!) 147/62 (!) 141/64   Pulse: 82 85 76   Resp: 19 17 15    Temp: 98.5 F (36.9 C) 98.4 F (36.9 C) 98.4 F (36.9 C)  TempSrc: Oral Oral Axillary   SpO2: 96% 96% 95% 96%  Weight:       Height:        Wt Readings from Last 3 Encounters:  11/04/2022 87.5 kg  07/08/22 87.5 kg  06/22/22 89.4 kg     Intake/Output Summary (Last 24 hours) at 11/12/2022 0853 Last data filed at 11/12/2022 0606 Gross per 24 hour  Intake 642.14 ml  Output 1850 ml  Net -1207.86 ml     Physical Exam  Awake, more alert this morning, following basic commands, on nasal cannula oxygen this morning, [place].AT,PERRAL Supple Neck, No JVD,   Symmetrical Chest wall movement, Good air movement bilaterally, bilateral crackles RRR,No Gallops,Rubs or new Murmurs,  +ve B.Sounds, Abd Soft, No tenderness,   2+ leg edema    Data Review:    Recent Labs  Lab 11/04/2022 1300 11/15/2022 1316 11/10/22 0045 11/11/22 1135 11/12/22 0430  WBC 4.3  --  4.3 5.0 4.4  HGB 12.2* 13.6 11.3* 11.9* 11.3*  HCT 40.9 40.0 38.3* 40.4 38.2*  PLT 109*  --  115* 120* 113*  MCV 93.0  --  92.5 92.4 91.4  MCH 27.7  --  27.3 27.2 27.0  MCHC 29.8*  --  29.5* 29.5* 29.6*  RDW 18.2*  --  18.2* 18.3* 18.2*  LYMPHSABS 0.3*  --   --  0.6* 0.5*  MONOABS 0.3  --   --  0.5 0.6  EOSABS 0.1  --   --  0.1 0.1  BASOSABS 0.0  --   --  0.1 0.0    Recent Labs  Lab 10/31/2022 1245 11/19/2022 1300 11/12/2022 1300 11/21/2022 1311 10/30/2022 1316 11/04/2022 1528 11/24/2022 1545 11/10/22 0045 11/10/22 0613 11/10/22 0630 11/10/22 1208 11/10/22 1636 11/11/22 1135 11/12/22 0430  NA  --  142   < >  --  142  --   --  143  --   --   --  148* 150* 151*  K  --  4.6   < >  --  4.5  --   --  4.2  --   --   --  3.5 3.1* 3.4*  CL  --  97*   < >  --  99  --   --  99  --   --   --  103 100 102  CO2  --  31  --   --   --   --   --  29  --   --   --  33* 35* 37*  ANIONGAP  --  14  --   --   --   --   --  15  --   --   --  12 15 12   GLUCOSE  --  80   < >  --  81  --   --  82  --   --   --  78 72 107*  BUN  --  54*   < >  --  57*  --   --  50*  --   --   --  49* 47* 45*  CREATININE  --  2.52*   < >  --  2.60*  --   --  2.56*  --   --   --  2.44* 2.49* 2.48*   AST  --  25  --   --   --   --   --  20  --   --   --   --  15 15  ALT  --  7  --   --   --   --   --  10  --   --   --   --  13 5  ALKPHOS  --  144*  --   --   --   --   --  131*  --   --   --   --  147* 127*  BILITOT  --  1.7*  --   --   --   --   --  2.1*  --   --   --   --  2.5* 2.4*  ALBUMIN  --  3.3*  --   --   --   --   --  3.1*  --   --   --   --  3.1* 2.8*  CRP  --   --   --   --   --   --   --   --   --  <0.5  --   --  0.9 1.3*  PROCALCITON  --   --   --   --   --  <0.10  --   --   --  0.11  --   --  0.16 0.16  LATICACIDVEN  --   --   --  1.2  --   --  1.4  --   --   --   --   --   --   --   INR  --  1.5*  --   --   --   --   --   --   --   --   --   --   --   --   AMMONIA  --   --   --   --   --   --   --   --   --   --  32  --   --   --   BNP 225.9*  --   --   --   --   --   --   --  305.9*  --   --   --  484.6* 679.0*  MG  --   --   --   --   --   --   --   --   --   --   --   --  2.4 2.4  CALCIUM  --  9.5  --   --   --   --   --  9.0  --   --   --  9.0 9.3 9.3   < > = values in this interval not displayed.      Recent Labs  Lab 11/13/2022 1245 11/12/2022 1300 10/30/2022 1311 11/04/2022 1528 11/19/2022 1545 11/10/22 0045 11/10/22 0613 11/10/22 0630 11/10/22 1208 11/10/22 1636 11/11/22 1135 11/12/22 0430  CRP  --   --   --   --   --   --   --  <0.5  --   --  0.9 1.3*  PROCALCITON  --   --   --  <0.10  --   --   --  0.11  --   --  0.16 0.16  LATICACIDVEN  --   --  1.2  --  1.4  --   --   --   --   --   --   --   INR  --  1.5*  --   --   --   --   --   --   --   --   --   --  AMMONIA  --   --   --   --   --   --   --   --  32  --   --   --   BNP 225.9*  --   --   --   --   --  305.9*  --   --   --  484.6* 679.0*  MG  --   --   --   --   --   --   --   --   --   --  2.4 2.4  CALCIUM  --  9.5  --   --   --  9.0  --   --   --  9.0 9.3 9.3   ABG    Component Value Date/Time   PHART 7.45 11/10/2022 0803   PCO2ART 52 (H) 11/10/2022 0803   PO2ART 100 11/10/2022 0803   HCO3  36.1 (H) 11/10/2022 0803   TCO2 35 (H) November 22, 2022 1316   O2SAT 97.3 11/10/2022 0803     Micro Results Recent Results (from the past 240 hour(s))  Resp panel by RT-PCR (RSV, Flu A&B, Covid) Anterior Nasal Swab     Status: None   Collection Time: 2022/11/22 12:44 PM   Specimen: Anterior Nasal Swab  Result Value Ref Range Status   SARS Coronavirus 2 by RT PCR NEGATIVE NEGATIVE Final   Influenza A by PCR NEGATIVE NEGATIVE Final   Influenza B by PCR NEGATIVE NEGATIVE Final    Comment: (NOTE) The Xpert Xpress SARS-CoV-2/FLU/RSV plus assay is intended as an aid in the diagnosis of influenza from Nasopharyngeal swab specimens and should not be used as a sole basis for treatment. Nasal washings and aspirates are unacceptable for Xpert Xpress SARS-CoV-2/FLU/RSV testing.  Fact Sheet for Patients: BloggerCourse.com  Fact Sheet for Healthcare Providers: SeriousBroker.it  This test is not yet approved or cleared by the [place] FDA and has been authorized for detection and/or diagnosis of SARS-CoV-2 by FDA under an Emergency Use Authorization (EUA). This EUA will remain in effect (meaning this test can be used) for the duration of the COVID-19 declaration under Section 564(b)(1) of the Act, 21 U.S.C. section 360bbb-3(b)(1), unless the authorization is terminated or revoked.     Resp Syncytial Virus by PCR NEGATIVE NEGATIVE Final    Comment: (NOTE) Fact Sheet for Patients: BloggerCourse.com  Fact Sheet for Healthcare Providers: SeriousBroker.it  This test is not yet approved or cleared by the [place] FDA and has been authorized for detection and/or diagnosis of SARS-CoV-2 by FDA under an Emergency Use Authorization (EUA). This EUA will remain in effect (meaning this test can be used) for the duration of the COVID-19 declaration under Section 564(b)(1) of the Act, 21  U.S.C. section 360bbb-3(b)(1), unless the authorization is terminated or revoked.  Performed at [facility], [address], [place], [place] [address]   Blood Culture (routine x 2)     Status: None (Preliminary result)   Collection Time: 11-22-2022  1:00 PM   Specimen: BLOOD RIGHT FOREARM  Result Value Ref Range Status   Specimen Description BLOOD RIGHT FOREARM  Final   Special Requests   Final    BOTTLES DRAWN AEROBIC AND ANAEROBIC Blood Culture results may not be optimal due to an excessive volume of blood received in culture bottles   Culture   Final    NO GROWTH 2 DAYS Performed at [facility], [address] [place], [place] [address]  Report Status PENDING  Incomplete  Blood Culture (routine x 2)     Status: None (Preliminary result)   Collection Time: 11/30/22  1:17 PM   Specimen: BLOOD  Result Value Ref Range Status   Specimen Description BLOOD LEFT ANTECUBITAL  Final   Special Requests   Final    BOTTLES DRAWN AEROBIC AND ANAEROBIC Blood Culture results may not be optimal due to an excessive volume of blood received in culture bottles   Culture   Final    NO GROWTH 2 DAYS Performed at [facility], [address], [place], [place] [address]    Report Status PENDING  Incomplete    Radiology Reports DG Chest Port 1 View  Result Date: 11/12/2022 CLINICAL DATA:  Shortness of breath. EXAM: PORTABLE CHEST 1 VIEW COMPARISON:  11/11/2022 FINDINGS: Cardiac enlargement is stable. Aortic atherosclerotic calcifications. Asymmetric elevation of the right hemidiaphragm is unchanged. Persistent right pleural effusion with veil like opacification over the right midlung. Right lower lobe atelectasis and consolidation and patchy airspace densities in the right upper lobe are unchanged. Stable mild subsegmental atelectasis in the left base. IMPRESSION: 1. No significant interval change. 2. Persistent right pleural effusion with veil like opacification over the right  midlung. 3. Right lower lobe atelectasis and consolidation and patchy airspace densities in the right upper lobe are unchanged. Electronically Signed   By: Signa Kell M.D.   On: 11/12/2022 08:14   DG Chest Port 1 View  Result Date: 11/11/2022 CLINICAL DATA:  Shortness of breath and altered mental status. EXAM: PORTABLE CHEST 1 VIEW COMPARISON:  11/10/2022 FINDINGS: Asymmetric elevation right hemidiaphragm with persistent right base collapse/consolidation. Vascular congestion with diffuse interstitial prominence. The cardio pericardial silhouette is enlarged. Bones are diffusely demineralized. Telemetry leads overlie the chest. IMPRESSION: 1. Persistent right base collapse/consolidation. 2. Vascular congestion with diffuse interstitial prominence, likely chronic. Electronically Signed   By: Kennith Center M.D.   On: 11/11/2022 07:56   EEG adult  Result Date: 11/10/2022 Charlsie Quest, MD     11/10/2022  2:43 PM Patient Name: Suvan Eisenberger MRN: [id number] Epilepsy Attending: Charlsie Quest Referring Physician/Provider: Leroy Sea, MD Date: 11/10/2022 Duration: 26.03 mins Patient history: 87yo m with ams getting eeg to evaluate for seizure Level of alertness: Awake, asleep AEDs during EEG study: Ativan Technical aspects: This EEG study was done with scalp electrodes positioned according to the 10-20 International system of electrode placement. Electrical activity was reviewed with band pass filter of 1-70Hz , sensitivity of 7 uV/mm, display speed of 10mm/sec with a 60Hz  notched filter applied as appropriate. EEG data were recorded continuously and digitally stored.  Video monitoring was available and reviewed as appropriate. Description: No posterior dominant rhythm was seen. Sleep was characterized by  sleep spindles (12 to 14 Hz), maximal frontocentral region. EEG showed continuous generalized 3 to 6 Hz theta-delta slowing. Hyperventilation and photic stimulation were not performed.    ABNORMALITY - Continuous slow, generalized IMPRESSION: This study is suggestive of moderate diffuse encephalopathy. No seizures or epileptiform discharges were seen throughout the recording. Charlsie Quest   DG Chest Port 1 View  Result Date: 11/10/2022 CLINICAL DATA:  [id number] SOB (shortness of breath) [id number] EXAM: PORTABLE CHEST 1 VIEW COMPARISON:  CT chest 11/30/22, chest x-ray November 30, 2022 FINDINGS: The heart and mediastinal contours are unchanged. Aortic calcification. Low lung volumes. Right base atelectasis with elevated right hemidiaphragm. Question airspace opacity of the right lung. Increased interstitial markings. No pleural effusion. No pneumothorax. No  acute osseous abnormality. IMPRESSION: 1. Right base atelectasis with elevated right hemidiaphragm. Questioned airspace opacity of the right lung correlates with airspace opacity on CT chest 2022/11/22. 2.  Aortic Atherosclerosis (ICD10-I70.0). Electronically Signed   By: Tish Frederickson M.D.   On: 11/10/2022 07:08   CT CHEST WO CONTRAST  Result Date: 2022-11-22 CLINICAL DATA:  Possible sepsis failure to thrive EXAM: CT CHEST WITHOUT CONTRAST TECHNIQUE: Multidetector CT imaging of the chest was performed following the standard protocol without IV contrast. RADIATION DOSE REDUCTION: This exam was performed according to the departmental dose-optimization program which includes automated exposure control, adjustment of the mA and/or kV according to patient size and/or use of iterative reconstruction technique. COMPARISON:  Chest x-ray November 22, 2022, 06/21/2022, CT lung bases 09/01/2017 FINDINGS: Cardiovascular: Limited without intravenous contrast. Advanced aortic atherosclerosis. No aneurysm. Extensive coronary vascular calcification. Mild cardiomegaly. No pericardial effusion Mediastinum/Nodes: Patent trachea. Slight tracheal deviation to the left from tortuous vessels. Mild air distension of the esophagus. Subcentimeter mediastinal lymph nodes.  Esophagus within normal limits Lungs/Pleura: Chronic elevation of right diaphragm. Small right greater than left pleural effusions. Consolidation with air bronchograms in the right lower lobe slightly progressive compared to the lung bases on prior CT. Heterogeneous consolidation and ground-glass density in the right upper lobe. Upper Abdomen: No acute finding Musculoskeletal: No acute or suspicious osseous abnormality IMPRESSION: 1. Chronic elevation of right diaphragm. 2. Small right greater than left pleural effusions. 3. Consolidation with air bronchograms in the right lower lobe, slightly progressive compared to the lung bases on prior CT. Heterogeneous consolidation and ground-glass density in the right upper lobe, findings are suspicious for pneumonia. 4. Aortic atherosclerosis. Aortic Atherosclerosis (ICD10-I70.0). Electronically Signed   By: Jasmine Pang M.D.   On: 11/22/2022 19:36   CT Head Wo Contrast  Result Date: November 22, 2022 CLINICAL DATA:  Mental status change, unknown cause; Polytrauma, blunt EXAM: CT HEAD WITHOUT CONTRAST CT CERVICAL SPINE WITHOUT CONTRAST TECHNIQUE: Multidetector CT imaging of the head and cervical spine was performed following the standard protocol without intravenous contrast. Multiplanar CT image reconstructions of the cervical spine were also generated. RADIATION DOSE REDUCTION: This exam was performed according to the departmental dose-optimization program which includes automated exposure control, adjustment of the mA and/or kV according to patient size and/or use of iterative reconstruction technique. COMPARISON:  None Available. FINDINGS: CT HEAD FINDINGS Brain: No evidence of acute infarction, hemorrhage, hydrocephalus, extra-axial collection or mass lesion/mass effect. Sequela of moderate chronic microvascular ischemic change. Vascular: No hyperdense vessel or unexpected calcification. Skull: Normal. Negative for fracture or focal lesion. Sinuses/Orbits: No middle ear  or mastoid effusion. Paranasal sinuses are clear. Bilateral lens replacement. Orbits are otherwise unremarkable. Other: None. CT CERVICAL SPINE FINDINGS Alignment: Grade 1 anterolisthesis of C3 on C4 and C4 on C5. Trace retrolisthesis of C5 on C6. Skull base and vertebrae: No acute fracture. No primary bone lesion or focal pathologic process. Soft tissues and spinal canal: No prevertebral fluid or swelling. No visible canal hematoma. Disc levels:  No evidence of high-grade spinal canal stenosis. Upper chest: Clustered nodularity in the right upper lobe is worrisome for an underlying infectious or inflammatory process. There is a small right-sided pleural effusion. Recommend further evaluation with a dedicated chest CT for more definitive characterization. Other: None IMPRESSION: 1. No acute intracranial abnormality. 2. No acute fracture or traumatic malalignment of the cervical spine. 3. Clustered nodularity in the right upper lobe is worrisome for an underlying infectious or inflammatory process. There is a small right-sided pleural effusion.  Recommend further evaluation with a dedicated chest CT for more definitive characterization. Electronically Signed   By: Lorenza Cambridge M.D.   On: 12/08/2022 15:10   CT Cervical Spine Wo Contrast  Result Date: 12/08/2022 CLINICAL DATA:  Mental status change, unknown cause; Polytrauma, blunt EXAM: CT HEAD WITHOUT CONTRAST CT CERVICAL SPINE WITHOUT CONTRAST TECHNIQUE: Multidetector CT imaging of the head and cervical spine was performed following the standard protocol without intravenous contrast. Multiplanar CT image reconstructions of the cervical spine were also generated. RADIATION DOSE REDUCTION: This exam was performed according to the departmental dose-optimization program which includes automated exposure control, adjustment of the mA and/or kV according to patient size and/or use of iterative reconstruction technique. COMPARISON:  None Available. FINDINGS: CT HEAD  FINDINGS Brain: No evidence of acute infarction, hemorrhage, hydrocephalus, extra-axial collection or mass lesion/mass effect. Sequela of moderate chronic microvascular ischemic change. Vascular: No hyperdense vessel or unexpected calcification. Skull: Normal. Negative for fracture or focal lesion. Sinuses/Orbits: No middle ear or mastoid effusion. Paranasal sinuses are clear. Bilateral lens replacement. Orbits are otherwise unremarkable. Other: None. CT CERVICAL SPINE FINDINGS Alignment: Grade 1 anterolisthesis of C3 on C4 and C4 on C5. Trace retrolisthesis of C5 on C6. Skull base and vertebrae: No acute fracture. No primary bone lesion or focal pathologic process. Soft tissues and spinal canal: No prevertebral fluid or swelling. No visible canal hematoma. Disc levels:  No evidence of high-grade spinal canal stenosis. Upper chest: Clustered nodularity in the right upper lobe is worrisome for an underlying infectious or inflammatory process. There is a small right-sided pleural effusion. Recommend further evaluation with a dedicated chest CT for more definitive characterization. Other: None IMPRESSION: 1. No acute intracranial abnormality. 2. No acute fracture or traumatic malalignment of the cervical spine. 3. Clustered nodularity in the right upper lobe is worrisome for an underlying infectious or inflammatory process. There is a small right-sided pleural effusion. Recommend further evaluation with a dedicated chest CT for more definitive characterization. Electronically Signed   By: Lorenza Cambridge M.D.   On: 12-08-2022 15:10   DG Chest Port 1 View  Result Date: 2022-12-08 CLINICAL DATA:  Questionable sepsis - evaluate for abnormality EXAM: PORTABLE CHEST 1 VIEW COMPARISON:  CXR 06/19/20 FINDINGS: Asymmetric elevation of the right hemidiaphragm. No pleural effusion. There is a hazy opacity at the right lung base, which could represent atelectasis or infection. No radiographically apparent displaced rib  fractures. Visualized upper abdomen is unremarkable. Aortic atherosclerotic calcifications. IMPRESSION: Hazy opacity at the right lung base could represent atelectasis or infection. Electronically Signed   By: Lorenza Cambridge M.D.   On: 2022-12-08 14:08      Signature  -   Susa Raring M.D on 11/12/2022 at 8:53 AM   -  To page go to [email or web address]

## 2022-11-12 NOTE — Progress Notes (Signed)
[facility] NEPHROLOGY PROGRESS NOTE  Assessment/ Plan: Pt is a 87 y.o. yo male  With past medical history significant for Parkinson disease, hypertension, dilated cardiomyopathy, pulmonary hypertension, CAD, CKD admitted with hypoxic respiratory failure, lethargy.  # AKI on CKD 3B with fluid overload on admission: AKI likely hemodynamically mediated with hypotension.  Initially received IV Lasix which is on hold now.  He looks intravascular volume depletion therefore I agree with starting IV fluid.  I have discussed with the patient's partner on 9/13, patient does not want heroic measures including no dialysis.  BUN and creatinine level stable for last few days.  He is currently DNR and DNI.  I agree with palliative care consult and possibly hospice care.  # Acute metabolic encephalopathy due to combination of metabolic derangement, Parkinson, renal failure: He is quite lethargic.  Recommend supportive care.  # Acute hypoxic respiratory failure: Initially received IV Lasix for fluid overload.  Also treated with antibiotics for pneumonia.  Seen by pulmonary and currently DNR.  # Hypernatremia: Presumably due to loop diuretics.  Now on hypotonic fluid.  # Hypokalemia: Received KCl.  Discussed with the primary team.  Plan as outlined above.  Recommend palliative/comfort measures. Sign off, please call us back with question or as needed.  Subjective: Seen and examined.  Patient is very lethargic.  Urine output is recorded 1.8 L.  Unable to obtain review of system.  No family member presented at the bedside today. Objective Vital signs in last 24 hours: Vitals:   11/11/22 2335 11/12/22 0321 11/12/22 0800 11/12/22 0819  BP: 132/67 (!) 147/62 (!) 141/64   Pulse: 82 85 76   Resp: 19 17 15    Temp: 98.5 F (36.9 C) 98.4 F (36.9 C) 98.4 F (36.9 C)   TempSrc: Oral Oral Axillary   SpO2: 96% 96% 95% 96%  Weight:      Height:       Weight change:   Intake/Output Summary (Last  24 hours) at 11/12/2022 1124 Last data filed at 11/12/2022 0606 Gross per 24 hour  Intake 642.14 ml  Output 1400 ml  Net -757.86 ml       Labs: RENAL PANEL Recent Labs  Lab 2022/11/17 1300 11-17-2022 1316 11/10/22 0045 11/10/22 1636 11/11/22 1135 11/12/22 0430  NA 142 142 143 148* 150* 151*  K 4.6 4.5 4.2 3.5 3.1* 3.4*  CL 97* 99 99 103 100 102  CO2 31  --  29 33* 35* 37*  GLUCOSE 80 81 82 78 72 107*  BUN 54* 57* 50* 49* 47* 45*  CREATININE 2.52* 2.60* 2.56* 2.44* 2.49* 2.48*  CALCIUM 9.5  --  9.0 9.0 9.3 9.3  MG  --   --   --   --  2.4 2.4  ALBUMIN 3.3*  --  3.1*  --  3.1* 2.8*    Liver Function Tests: Recent Labs  Lab 11/10/22 0045 11/11/22 1135 11/12/22 0430  AST 20 15 15   ALT 10 13 5   ALKPHOS 131* 147* 127*  BILITOT 2.1* 2.5* 2.4*  PROT 6.5 6.9 6.6  ALBUMIN 3.1* 3.1* 2.8*   No results for input(s): "LIPASE", "AMYLASE" in the last 168 hours. Recent Labs  Lab 11/10/22 1208  AMMONIA 32   CBC: Recent Labs    11-17-22 1300 2022-11-17 1316 11/10/22 0045 11/11/22 1135 11/12/22 0430  HGB 12.2* 13.6 11.3* 11.9* 11.3*  MCV 93.0  --  92.5 92.4 91.4  VITAMINB12  --   --   --  259  --  FOLATE  --   --   --  10.8  --     Cardiac Enzymes: Recent Labs  Lab 2022/12/05 1300  CKTOTAL 82   CBG: Recent Labs  Lab 2022-12-05 1339  GLUCAP 84    Iron Studies: No results for input(s): "IRON", "TIBC", "TRANSFERRIN", "FERRITIN" in the last 72 hours. Studies/Results: DG Chest Port 1 View  Result Date: 11/12/2022 CLINICAL DATA:  Shortness of breath. EXAM: PORTABLE CHEST 1 VIEW COMPARISON:  11/11/2022 FINDINGS: Cardiac enlargement is stable. Aortic atherosclerotic calcifications. Asymmetric elevation of the right hemidiaphragm is unchanged. Persistent right pleural effusion with veil like opacification over the right midlung. Right lower lobe atelectasis and consolidation and patchy airspace densities in the right upper lobe are unchanged. Stable mild subsegmental  atelectasis in the left base. IMPRESSION: 1. No significant interval change. 2. Persistent right pleural effusion with veil like opacification over the right midlung. 3. Right lower lobe atelectasis and consolidation and patchy airspace densities in the right upper lobe are unchanged. Electronically Signed   By: Signa Kell M.D.   On: 11/12/2022 08:14   DG Chest Port 1 View  Result Date: 11/11/2022 CLINICAL DATA:  Shortness of breath and altered mental status. EXAM: PORTABLE CHEST 1 VIEW COMPARISON:  11/10/2022 FINDINGS: Asymmetric elevation right hemidiaphragm with persistent right base collapse/consolidation. Vascular congestion with diffuse interstitial prominence. The cardio pericardial silhouette is enlarged. Bones are diffusely demineralized. Telemetry leads overlie the chest. IMPRESSION: 1. Persistent right base collapse/consolidation. 2. Vascular congestion with diffuse interstitial prominence, likely chronic. Electronically Signed   By: Kennith Center M.D.   On: 11/11/2022 07:56   EEG adult  Result Date: 11/10/2022 Charlsie Quest, MD     11/10/2022  2:43 PM Patient Name: [name] MRN: [id number] Epilepsy Attending: Charlsie Quest Referring Physician/Provider: Leroy Sea, MD Date: 11/10/2022 Duration: 26.03 mins Patient history: 87yo m with ams getting eeg to evaluate for seizure Level of alertness: Awake, asleep AEDs during EEG study: Ativan Technical aspects: This EEG study was done with scalp electrodes positioned according to the 10-20 International system of electrode placement. Electrical activity was reviewed with band pass filter of 1-70Hz , sensitivity of 7 uV/mm, display speed of 54mm/sec with a 60Hz  notched filter applied as appropriate. EEG data were recorded continuously and digitally stored.  Video monitoring was available and reviewed as appropriate. Description: No posterior dominant rhythm was seen. Sleep was characterized by  sleep spindles (12 to 14 Hz),  maximal frontocentral region. EEG showed continuous generalized 3 to 6 Hz theta-delta slowing. Hyperventilation and photic stimulation were not performed.   ABNORMALITY - Continuous slow, generalized IMPRESSION: This study is suggestive of moderate diffuse encephalopathy. No seizures or epileptiform discharges were seen throughout the recording. Priyanka Annabelle Harman    Medications: Infusions:  ampicillin-sulbactam (UNASYN) IV 3 g (11/12/22 0840)   dextrose 5 % with KCl 20 mEq / L 20 mEq (11/12/22 1000)    Scheduled Medications:  (feeding supplement) PROSource Plus  30 mL Oral BID BM   apixaban  2.5 mg Oral BID   carbidopa-levodopa  2 tablet Oral TID   feeding supplement  237 mL Oral BID BM   ipratropium-albuterol  3 mL Nebulization BID   lactose free nutrition  237 mL Oral TID WC    have reviewed scheduled and prn medications.  Physical Exam: General:  ill looking male, lethargic, Heart:RRR, s1s2 nl Lungs: Coarse breath sound bilateral. Abdomen:soft, Non-tender, non-distended Extremities: Peripheral edema with wrinkling skin which is presumably due  to improving edema. Neurology: Lethargic and confused.  Dublin Grayer Elpidio Anis Maribell Demeo 11/12/2022,11:24 AM  LOS: 3 days

## 2022-11-13 DIAGNOSIS — R4182 Altered mental status, unspecified: Secondary | ICD-10-CM | POA: Diagnosis not present

## 2022-11-13 DIAGNOSIS — Z7189 Other specified counseling: Secondary | ICD-10-CM | POA: Diagnosis not present

## 2022-11-13 DIAGNOSIS — Z515 Encounter for palliative care: Secondary | ICD-10-CM

## 2022-11-13 LAB — COMPREHENSIVE METABOLIC PANEL
ALT: 6 U/L (ref 0–44)
AST: 14 U/L — ABNORMAL LOW (ref 15–41)
Albumin: 2.7 g/dL — ABNORMAL LOW (ref 3.5–5.0)
Alkaline Phosphatase: 121 U/L (ref 38–126)
Anion gap: 8 (ref 5–15)
BUN: 41 mg/dL — ABNORMAL HIGH (ref 8–23)
CO2: 36 mmol/L — ABNORMAL HIGH (ref 22–32)
Calcium: 8.8 mg/dL — ABNORMAL LOW (ref 8.9–10.3)
Chloride: 103 mmol/L (ref 98–111)
Creatinine, Ser: 2.18 mg/dL — ABNORMAL HIGH (ref 0.61–1.24)
GFR, Estimated: 29 mL/min — ABNORMAL LOW (ref >60)
Glucose, Bld: 94 mg/dL (ref 70–99)
Potassium: 3.9 mmol/L (ref 3.5–5.1)
Sodium: 147 mmol/L — ABNORMAL HIGH (ref 135–145)
Total Bilirubin: 2.4 mg/dL — ABNORMAL HIGH (ref 0.3–1.2)
Total Protein: 6.5 g/dL (ref 6.5–8.1)

## 2022-11-13 LAB — CBC WITH DIFFERENTIAL/PLATELET
Abs Immature Granulocytes: 0.01 10*3/uL (ref 0.00–0.07)
Basophils Absolute: 0 10*3/uL (ref 0.0–0.1)
Basophils Relative: 1 %
Eosinophils Absolute: 0.2 10*3/uL (ref 0.0–0.5)
Eosinophils Relative: 4 %
HCT: 39.3 % (ref 39.0–52.0)
Hemoglobin: 11.7 g/dL — ABNORMAL LOW (ref 13.0–17.0)
Immature Granulocytes: 0 %
Lymphocytes Relative: 19 %
Lymphs Abs: 0.8 10*3/uL (ref 0.7–4.0)
MCH: 28.3 pg (ref 26.0–34.0)
MCHC: 29.8 g/dL — ABNORMAL LOW (ref 30.0–36.0)
MCV: 95.2 fL (ref 80.0–100.0)
Monocytes Absolute: 0.6 10*3/uL (ref 0.1–1.0)
Monocytes Relative: 14 %
Neutro Abs: 2.7 10*3/uL (ref 1.7–7.7)
Neutrophils Relative %: 62 %
Platelets: 94 10*3/uL — ABNORMAL LOW (ref 150–400)
RBC: 4.13 MIL/uL — ABNORMAL LOW (ref 4.22–5.81)
RDW: 18.1 % — ABNORMAL HIGH (ref 11.5–15.5)
WBC: 4.3 10*3/uL (ref 4.0–10.5)
nRBC: 0 % (ref 0.0–0.2)

## 2022-11-13 LAB — TSH: TSH: 6.222 u[IU]/mL — ABNORMAL HIGH (ref 0.350–4.500)

## 2022-11-13 LAB — PROCALCITONIN: Procalcitonin: 0.18 ng/mL

## 2022-11-13 LAB — BRAIN NATRIURETIC PEPTIDE: B Natriuretic Peptide: 426.5 pg/mL — ABNORMAL HIGH (ref 0.0–100.0)

## 2022-11-13 LAB — C-REACTIVE PROTEIN: CRP: 1.7 mg/dL — ABNORMAL HIGH (ref <1.0)

## 2022-11-13 LAB — MAGNESIUM: Magnesium: 2.5 mg/dL — ABNORMAL HIGH (ref 1.7–2.4)

## 2022-11-13 MED ORDER — CYANOCOBALAMIN 1000 MCG/ML IJ SOLN
1000.0000 ug | Freq: Every day | INTRAMUSCULAR | Status: DC
  Administered 2022-11-13 – 2022-11-16 (×3): 1000 ug via SUBCUTANEOUS
  Filled 2022-11-13 (×3): qty 1

## 2022-11-13 MED ORDER — POTASSIUM CL IN DEXTROSE 5% 20 MEQ/L IV SOLN
20.0000 meq | INTRAVENOUS | Status: DC
  Administered 2022-11-13: 20 meq via INTRAVENOUS
  Filled 2022-11-13 (×2): qty 1000

## 2022-11-13 MED ORDER — VITAMIN B-12 1000 MCG PO TABS: 1000.0000 ug | ORAL_TABLET | Freq: Every day | ORAL | Status: DC

## 2022-11-13 MED ORDER — FUROSEMIDE 10 MG/ML IJ SOLN
40.0000 mg | Freq: Two times a day (BID) | INTRAMUSCULAR | Status: DC
  Administered 2022-11-13: 40 mg via INTRAVENOUS
  Filled 2022-11-13 (×2): qty 4

## 2022-11-13 MED ORDER — ALBUMIN HUMAN 25 % IV SOLN
50.0000 g | Freq: Four times a day (QID) | INTRAVENOUS | Status: AC
  Administered 2022-11-13 (×3): 50 g via INTRAVENOUS
  Filled 2022-11-13 (×3): qty 200

## 2022-11-13 NOTE — Progress Notes (Addendum)
PROGRESS NOTE                                                                                                                                                                                                             Patient Demographics:    [name], is a 87 y.o. male, DOB - 23-Jan-1936, NFA:[id number]  Outpatient Primary MD for the patient is Merri Brunette, MD    LOS - 4  Admit date - 11/17/2022    Chief Complaint  Patient presents with   Failure To Thrive       Brief Narrative (HPI from H&P)     87 year old male with history of Parkinson's disease, pulmonary hypertension, dilated cardiomyopathy, CAD, CKD 3B baseline creatinine around 1.6 who was recently started on nuplacid for his Parkinson's about 10 days ago, he took 6 doses of that medication last being on 11/07/2022.  Per caregiver since the start of this medication he has been getting increasingly weak and confused, he is usually wheelchair-bound and cannot walk a little bit but became increasingly more lethargic and short of breath.    Brought to the ER where he was found to have hypoxic respiratory failure, lethargy, CT chest with questionable pneumonia, CT head nonacute, initially required nasal cannula oxygen however overnight became more hypoxic requiring BiPAP with 100% FiO2, transferred to my care on 11/10/2022.  Currently he is obtunded, on BiPAP with 100% FiO2.  Exam reveals 3+ leg edema and crackles, stable WBC, negative procalcitonin and ABG suggests severe hypoxic respiratory failure.   Subjective:   Patient in bed, denies any headache or chest pain, will answer a few questions, overall feels tired.   Assessment  & Plan :    Acute hypoxic respiratory failure despite being on CPAP with initial FiO2 30% and IPAP 30. His exam reveals massive fluid overload, bibasilar crackles, CT chest is of possible pneumonia however no fever, no white count no  procalcitonin.  He was placed on BiPAP, diuresed with IV Lasix, antibiotics titrated down, seen by PCCM.  He is DNR, continue medical treatment, significant improvement on 11/11/2022, continue as needed Lasix with protein supplementation and IV albumin if needed, continue present antibiotics, supplemental oxygen, I-S flutter valve for pulmonary toiletry and monitor.  Seen by pulmonary this admission as well.  Worsening encephalopathy.  Due to combination of hypoxia now,  underlying Parkinson's and recent exposure to Nuplacid - CT head unremarkable, no focal deficits, holding Nuplazid, oxygen as above, stable EEG, with offending medication outpatient improving.  Neurology on board.  B12 was borderline hence replaced, stable folic acid, B1 levels pending, check TSH.  Low normal vitamin B12 levels.  Replace.    HX of Parkinson's.  Currently continuing home medication Sinemet, PT OT and speech eval, neurology on board.  AKI on CKD 3B with hypernatremia.  Baseline creatinine 1.6, AKI in the setting of massive fluid overload, however due to poor oral intake and protein calorie malnutrition most of the fluid is third spaced and he is intravascularly getting dehydrated, oral protein supplementation, poor candidate for NG tube or PEG tube, diuretics as tolerated, nephrology on board.  Encouraged free water intake.  Will add IV albumin with few doses of Lasix on 11/13/2022 once labs have resulted for the same date.  Severe PCM.  Protein supplements.    Paroxysmal atrial fibrillation [place] vas 2 score greater than 3.  On Eliquis, continue as tolerated, low-dose beta-blocker scheduled oral and IV and as needed.  Massive lower extremity edema.  Diurese.  Nonspecific CT cervical spine changes-CT cervical spine showed grade 1 anterolisthesis of C3 on C4 and C4 and C5, trace retrolisthesis of C5 on C6. [name]  Outpatient neurosurgery follow-up.  No neck pain.   Extremely poor baseline functional status, not eating or  drinking for several days, girlfriend confirms DNR and wants to talk to palliative care for goals of care.  Will consult palliative care.  Note girlfriend agrees she wants to continue medical treatment for other 1 to 2 days, thereafter comfort measures if no improvement.  However times a little indecisive.      Condition - Extremely Guarded  Family Communication  : Family decision maker girlfriend [name] [phone] on 11/10/2022, message left 11/11/2022 9:30 AM, called 11/12/2022 at 8:45 AM, going to answering machine, x 3, updated girlfriend 11/12/2022, 11/13/2022 bedside.  Code Status :  DNR-I  Consults  : PCCM, neurology, nephrology, palliative care  PUD Prophylaxis :     Procedures  :     CT Chest - 1. Chronic elevation of right diaphragm. 2. Small right greater than left pleural effusions. 3. Consolidation with air bronchograms in the right lower lobe, slightly progressive compared to the lung bases on prior CT. Heterogeneous consolidation and ground-glass density in the right upper lobe, findings are suspicious for pneumonia. 4. Aortic atherosclerosis. Aortic Atherosclerosis (ICD10-I70.0).  CT head and C Spine - 1. No acute intracranial abnormality. 2. No acute fracture or traumatic malalignment of the cervical spine. 3. Clustered nodularity in the right upper lobe is worrisome for an underlying infectious or inflammatory process. There is a small right-sided pleural effusion. Recommend further evaluation with a dedicated chest CT for more definitive characterization.      Disposition Plan  :    Status is: Inpatient  DVT Prophylaxis  :    apixaban (ELIQUIS) tablet 2.5 mg Start: November 30, 2022 2200 apixaban (ELIQUIS) tablet 2.5 mg     Lab Results  Component Value Date   PLT 113 (L) 11/12/2022    Diet :  Diet Order             DIET DYS 3 Room service appropriate? No; Fluid consistency: Thin  Diet effective now                    Inpatient Medications  Scheduled Meds:   (feeding supplement) PROSource Plus  30 mL Oral BID BM   apixaban  2.5 mg Oral BID   carbidopa-levodopa  2 tablet Oral TID   feeding supplement  237 mL Oral BID BM   ipratropium-albuterol  3 mL Nebulization BID   lactose free nutrition  237 mL Oral TID WC   Continuous Infusions:  ampicillin-sulbactam (UNASYN) IV 3 g (11/13/22 0907)   PRN Meds:.acetaminophen **OR** acetaminophen, ipratropium-albuterol, ondansetron **OR** ondansetron (ZOFRAN) IV    Objective:   Vitals:   11/12/22 2038 11/13/22 0000 11/13/22 0400 11/13/22 0800  BP:  139/67 137/63 128/64  Pulse:  80 78 79  Resp:  14 15 15   Temp:  97.8 F (36.6 C) 97.7 F (36.5 C) 97.6 F (36.4 C)  TempSrc:  Axillary Axillary Axillary  SpO2: 95% 96% 97% 97%  Weight:      Height:        Wt Readings from Last 3 Encounters:  11/11/2022 87.5 kg  07/08/22 87.5 kg  06/22/22 89.4 kg     Intake/Output Summary (Last 24 hours) at 11/13/2022 0944 Last data filed at 11/13/2022 0538 Gross per 24 hour  Intake --  Output 1800 ml  Net -1800 ml     Physical Exam  Awake, more alert this morning, following basic commands, on nasal cannula oxygen this morning, [place].AT,PERRAL Supple Neck, No JVD,   Symmetrical Chest wall movement, Good air movement bilaterally, bilateral crackles RRR,No Gallops,Rubs or new Murmurs,  +ve B.Sounds, Abd Soft, No tenderness,   2+ leg edema    Data Review:    Recent Labs  Lab 11/02/2022 1300 11/14/2022 1316 11/10/22 0045 11/11/22 1135 11/12/22 0430  WBC 4.3  --  4.3 5.0 4.4  HGB 12.2* 13.6 11.3* 11.9* 11.3*  HCT 40.9 40.0 38.3* 40.4 38.2*  PLT 109*  --  115* 120* 113*  MCV 93.0  --  92.5 92.4 91.4  MCH 27.7  --  27.3 27.2 27.0  MCHC 29.8*  --  29.5* 29.5* 29.6*  RDW 18.2*  --  18.2* 18.3* 18.2*  LYMPHSABS 0.3*  --   --  0.6* 0.5*  MONOABS 0.3  --   --  0.5 0.6  EOSABS 0.1  --   --  0.1 0.1  BASOSABS 0.0  --   --  0.1 0.0    Recent Labs  Lab 11/08/2022 1245 11/18/2022 1300 11/12/2022 1300  11/06/2022 1311 11/05/2022 1316 11/06/2022 1528 11/07/2022 1545 11/10/22 0045 11/10/22 0613 11/10/22 0630 11/10/22 1208 11/10/22 1636 11/11/22 1135 11/12/22 0430  NA  --  142   < >  --  142  --   --  143  --   --   --  148* 150* 151*  K  --  4.6   < >  --  4.5  --   --  4.2  --   --   --  3.5 3.1* 3.4*  CL  --  97*   < >  --  99  --   --  99  --   --   --  103 100 102  CO2  --  31  --   --   --   --   --  29  --   --   --  33* 35* 37*  ANIONGAP  --  14  --   --   --   --   --  15  --   --   --  12 15 12   GLUCOSE  --  80   < >  --  81  --   --  82  --   --   --  78 72 107*  BUN  --  54*   < >  --  57*  --   --  50*  --   --   --  49* 47* 45*  CREATININE  --  2.52*   < >  --  2.60*  --   --  2.56*  --   --   --  2.44* 2.49* 2.48*  AST  --  25  --   --   --   --   --  20  --   --   --   --  15 15  ALT  --  7  --   --   --   --   --  10  --   --   --   --  13 5  ALKPHOS  --  144*  --   --   --   --   --  131*  --   --   --   --  147* 127*  BILITOT  --  1.7*  --   --   --   --   --  2.1*  --   --   --   --  2.5* 2.4*  ALBUMIN  --  3.3*  --   --   --   --   --  3.1*  --   --   --   --  3.1* 2.8*  CRP  --   --   --   --   --   --   --   --   --  <0.5  --   --  0.9 1.3*  PROCALCITON  --   --   --   --   --  <0.10  --   --   --  0.11  --   --  0.16 0.16  LATICACIDVEN  --   --   --  1.2  --   --  1.4  --   --   --   --   --   --   --   INR  --  1.5*  --   --   --   --   --   --   --   --   --   --   --   --   AMMONIA  --   --   --   --   --   --   --   --   --   --  32  --   --   --   BNP 225.9*  --   --   --   --   --   --   --  305.9*  --   --   --  484.6* 679.0*  MG  --   --   --   --   --   --   --   --   --   --   --   --  2.4 2.4  CALCIUM  --  9.5  --   --   --   --   --  9.0  --   --   --  9.0 9.3 9.3   < > = values in this interval not displayed.      Recent Labs  Lab Dec 08, 2022 1245 12/08/2022 1300 Dec 08, 2022 1311  11-22-2022 1528 2022-11-22 1545 11/10/22 0045 11/10/22 0613 11/10/22 0630  11/10/22 1208 11/10/22 1636 11/11/22 1135 11/12/22 0430  CRP  --   --   --   --   --   --   --  <0.5  --   --  0.9 1.3*  PROCALCITON  --   --   --  <0.10  --   --   --  0.11  --   --  0.16 0.16  LATICACIDVEN  --   --  1.2  --  1.4  --   --   --   --   --   --   --   INR  --  1.5*  --   --   --   --   --   --   --   --   --   --   AMMONIA  --   --   --   --   --   --   --   --  32  --   --   --   BNP 225.9*  --   --   --   --   --  305.9*  --   --   --  484.6* 679.0*  MG  --   --   --   --   --   --   --   --   --   --  2.4 2.4  CALCIUM  --  9.5  --   --   --  9.0  --   --   --  9.0 9.3 9.3   Lab Results  Component Value Date   TSH 2.94 09/15/2016     Micro Results Recent Results (from the past 240 hour(s))  Resp panel by RT-PCR (RSV, Flu A&B, Covid) Anterior Nasal Swab     Status: None   Collection Time: November 22, 2022 12:44 PM   Specimen: Anterior Nasal Swab  Result Value Ref Range Status   SARS Coronavirus 2 by RT PCR NEGATIVE NEGATIVE Final   Influenza A by PCR NEGATIVE NEGATIVE Final   Influenza B by PCR NEGATIVE NEGATIVE Final    Comment: (NOTE) The Xpert Xpress SARS-CoV-2/FLU/RSV plus assay is intended as an aid in the diagnosis of influenza from Nasopharyngeal swab specimens and should not be used as a sole basis for treatment. Nasal washings and aspirates are unacceptable for Xpert Xpress SARS-CoV-2/FLU/RSV testing.  Fact Sheet for Patients: BloggerCourse.com  Fact Sheet for Healthcare Providers: SeriousBroker.it  This test is not yet approved or cleared by the [place] FDA and has been authorized for detection and/or diagnosis of SARS-CoV-2 by FDA under an Emergency Use Authorization (EUA). This EUA will remain in effect (meaning this test can be used) for the duration of the COVID-19 declaration under Section 564(b)(1) of the Act, 21 U.S.C. section 360bbb-3(b)(1), unless the authorization is terminated  or revoked.     Resp Syncytial Virus by PCR NEGATIVE NEGATIVE Final    Comment: (NOTE) Fact Sheet for Patients: BloggerCourse.com  Fact Sheet for Healthcare Providers: SeriousBroker.it  This test is not yet approved or cleared by the [place] FDA and has been authorized for detection and/or diagnosis of SARS-CoV-2 by FDA under an Emergency Use Authorization (EUA). This EUA will remain in effect (meaning this test can be used) for the duration of the COVID-19 declaration under Section 564(b)(1) of the Act, 21 U.S.C. section 360bbb-3(b)(1), unless the authorization is terminated or revoked.  Performed at  [facility], [address], [place], [place] [address]   Blood Culture (routine x 2)     Status: None (Preliminary result)   Collection Time: 11/27/2022  1:00 PM   Specimen: BLOOD RIGHT FOREARM  Result Value Ref Range Status   Specimen Description BLOOD RIGHT FOREARM  Final   Special Requests   Final    BOTTLES DRAWN AEROBIC AND ANAEROBIC Blood Culture results may not be optimal due to an excessive volume of blood received in culture bottles   Culture   Final    NO GROWTH 4 DAYS Performed at [facility], [address], [place], [place] [address]    Report Status PENDING  Incomplete  Blood Culture (routine x 2)     Status: None (Preliminary result)   Collection Time: 11/25/2022  1:17 PM   Specimen: BLOOD  Result Value Ref Range Status   Specimen Description BLOOD LEFT ANTECUBITAL  Final   Special Requests   Final    BOTTLES DRAWN AEROBIC AND ANAEROBIC Blood Culture results may not be optimal due to an excessive volume of blood received in culture bottles   Culture   Final    NO GROWTH 4 DAYS Performed at [facility], [address], [place], [place] [address]    Report Status PENDING  Incomplete    Radiology Reports DG Chest Port 1 View  Result Date: 11/12/2022 CLINICAL DATA:  Shortness of  breath. EXAM: PORTABLE CHEST 1 VIEW COMPARISON:  11/11/2022 FINDINGS: Cardiac enlargement is stable. Aortic atherosclerotic calcifications. Asymmetric elevation of the right hemidiaphragm is unchanged. Persistent right pleural effusion with veil like opacification over the right midlung. Right lower lobe atelectasis and consolidation and patchy airspace densities in the right upper lobe are unchanged. Stable mild subsegmental atelectasis in the left base. IMPRESSION: 1. No significant interval change. 2. Persistent right pleural effusion with veil like opacification over the right midlung. 3. Right lower lobe atelectasis and consolidation and patchy airspace densities in the right upper lobe are unchanged. Electronically Signed   By: Signa Kell M.D.   On: 11/12/2022 08:14   DG Chest Port 1 View  Result Date: 11/11/2022 CLINICAL DATA:  Shortness of breath and altered mental status. EXAM: PORTABLE CHEST 1 VIEW COMPARISON:  11/10/2022 FINDINGS: Asymmetric elevation right hemidiaphragm with persistent right base collapse/consolidation. Vascular congestion with diffuse interstitial prominence. The cardio pericardial silhouette is enlarged. Bones are diffusely demineralized. Telemetry leads overlie the chest. IMPRESSION: 1. Persistent right base collapse/consolidation. 2. Vascular congestion with diffuse interstitial prominence, likely chronic. Electronically Signed   By: Kennith Center M.D.   On: 11/11/2022 07:56   EEG adult  Result Date: 11/10/2022 Charlsie Quest, MD     11/10/2022  2:43 PM Patient Name: Henson Swineford MRN: [id number] Epilepsy Attending: Charlsie Quest Referring Physician/Provider: Leroy Sea, MD Date: 11/10/2022 Duration: 26.03 mins Patient history: 87yo m with ams getting eeg to evaluate for seizure Level of alertness: Awake, asleep AEDs during EEG study: Ativan Technical aspects: This EEG study was done with scalp electrodes positioned according to the 10-20 International  system of electrode placement. Electrical activity was reviewed with band pass filter of 1-70Hz , sensitivity of 7 uV/mm, display speed of 53mm/sec with a 60Hz  notched filter applied as appropriate. EEG data were recorded continuously and digitally stored.  Video monitoring was available and reviewed as appropriate. Description: No posterior dominant rhythm was seen. Sleep was characterized by  sleep spindles (12 to 14 Hz), maximal  frontocentral region. EEG showed continuous generalized 3 to 6 Hz theta-delta slowing. Hyperventilation and photic stimulation were not performed.   ABNORMALITY - Continuous slow, generalized IMPRESSION: This study is suggestive of moderate diffuse encephalopathy. No seizures or epileptiform discharges were seen throughout the recording. Charlsie Quest   DG Chest Port 1 View  Result Date: 11/10/2022 CLINICAL DATA:  [id number] SOB (shortness of breath) [id number] EXAM: PORTABLE CHEST 1 VIEW COMPARISON:  CT chest 11/28/2022, chest x-ray 11/21/2022 FINDINGS: The heart and mediastinal contours are unchanged. Aortic calcification. Low lung volumes. Right base atelectasis with elevated right hemidiaphragm. Question airspace opacity of the right lung. Increased interstitial markings. No pleural effusion. No pneumothorax. No acute osseous abnormality. IMPRESSION: 1. Right base atelectasis with elevated right hemidiaphragm. Questioned airspace opacity of the right lung correlates with airspace opacity on CT chest 11/19/2022. 2.  Aortic Atherosclerosis (ICD10-I70.0). Electronically Signed   By: Tish Frederickson M.D.   On: 11/10/2022 07:08   CT CHEST WO CONTRAST  Result Date: 11/06/2022 CLINICAL DATA:  Possible sepsis failure to thrive EXAM: CT CHEST WITHOUT CONTRAST TECHNIQUE: Multidetector CT imaging of the chest was performed following the standard protocol without IV contrast. RADIATION DOSE REDUCTION: This exam was performed according to the departmental dose-optimization program which includes  automated exposure control, adjustment of the mA and/or kV according to patient size and/or use of iterative reconstruction technique. COMPARISON:  Chest x-ray 11/01/2022, 06/21/2022, CT lung bases 09/01/2017 FINDINGS: Cardiovascular: Limited without intravenous contrast. Advanced aortic atherosclerosis. No aneurysm. Extensive coronary vascular calcification. Mild cardiomegaly. No pericardial effusion Mediastinum/Nodes: Patent trachea. Slight tracheal deviation to the left from tortuous vessels. Mild air distension of the esophagus. Subcentimeter mediastinal lymph nodes. Esophagus within normal limits Lungs/Pleura: Chronic elevation of right diaphragm. Small right greater than left pleural effusions. Consolidation with air bronchograms in the right lower lobe slightly progressive compared to the lung bases on prior CT. Heterogeneous consolidation and ground-glass density in the right upper lobe. Upper Abdomen: No acute finding Musculoskeletal: No acute or suspicious osseous abnormality IMPRESSION: 1. Chronic elevation of right diaphragm. 2. Small right greater than left pleural effusions. 3. Consolidation with air bronchograms in the right lower lobe, slightly progressive compared to the lung bases on prior CT. Heterogeneous consolidation and ground-glass density in the right upper lobe, findings are suspicious for pneumonia. 4. Aortic atherosclerosis. Aortic Atherosclerosis (ICD10-I70.0). Electronically Signed   By: Jasmine Pang M.D.   On: 11/17/2022 19:36   CT Head Wo Contrast  Result Date: 11/13/2022 CLINICAL DATA:  Mental status change, unknown cause; Polytrauma, blunt EXAM: CT HEAD WITHOUT CONTRAST CT CERVICAL SPINE WITHOUT CONTRAST TECHNIQUE: Multidetector CT imaging of the head and cervical spine was performed following the standard protocol without intravenous contrast. Multiplanar CT image reconstructions of the cervical spine were also generated. RADIATION DOSE REDUCTION: This exam was performed  according to the departmental dose-optimization program which includes automated exposure control, adjustment of the mA and/or kV according to patient size and/or use of iterative reconstruction technique. COMPARISON:  None Available. FINDINGS: CT HEAD FINDINGS Brain: No evidence of acute infarction, hemorrhage, hydrocephalus, extra-axial collection or mass lesion/mass effect. Sequela of moderate chronic microvascular ischemic change. Vascular: No hyperdense vessel or unexpected calcification. Skull: Normal. Negative for fracture or focal lesion. Sinuses/Orbits: No middle ear or mastoid effusion. Paranasal sinuses are clear. Bilateral lens replacement. Orbits are otherwise unremarkable. Other: None. CT CERVICAL SPINE FINDINGS Alignment: Grade 1 anterolisthesis of C3 on C4 and C4 on C5. Trace retrolisthesis of C5 on C6. Skull base  and vertebrae: No acute fracture. No primary bone lesion or focal pathologic process. Soft tissues and spinal canal: No prevertebral fluid or swelling. No visible canal hematoma. Disc levels:  No evidence of high-grade spinal canal stenosis. Upper chest: Clustered nodularity in the right upper lobe is worrisome for an underlying infectious or inflammatory process. There is a small right-sided pleural effusion. Recommend further evaluation with a dedicated chest CT for more definitive characterization. Other: None IMPRESSION: 1. No acute intracranial abnormality. 2. No acute fracture or traumatic malalignment of the cervical spine. 3. Clustered nodularity in the right upper lobe is worrisome for an underlying infectious or inflammatory process. There is a small right-sided pleural effusion. Recommend further evaluation with a dedicated chest CT for more definitive characterization. Electronically Signed   By: Lorenza Cambridge M.D.   On: 11/06/2022 15:10   CT Cervical Spine Wo Contrast  Result Date: November 16, 2022 CLINICAL DATA:  Mental status change, unknown cause; Polytrauma, blunt EXAM: CT  HEAD WITHOUT CONTRAST CT CERVICAL SPINE WITHOUT CONTRAST TECHNIQUE: Multidetector CT imaging of the head and cervical spine was performed following the standard protocol without intravenous contrast. Multiplanar CT image reconstructions of the cervical spine were also generated. RADIATION DOSE REDUCTION: This exam was performed according to the departmental dose-optimization program which includes automated exposure control, adjustment of the mA and/or kV according to patient size and/or use of iterative reconstruction technique. COMPARISON:  None Available. FINDINGS: CT HEAD FINDINGS Brain: No evidence of acute infarction, hemorrhage, hydrocephalus, extra-axial collection or mass lesion/mass effect. Sequela of moderate chronic microvascular ischemic change. Vascular: No hyperdense vessel or unexpected calcification. Skull: Normal. Negative for fracture or focal lesion. Sinuses/Orbits: No middle ear or mastoid effusion. Paranasal sinuses are clear. Bilateral lens replacement. Orbits are otherwise unremarkable. Other: None. CT CERVICAL SPINE FINDINGS Alignment: Grade 1 anterolisthesis of C3 on C4 and C4 on C5. Trace retrolisthesis of C5 on C6. Skull base and vertebrae: No acute fracture. No primary bone lesion or focal pathologic process. Soft tissues and spinal canal: No prevertebral fluid or swelling. No visible canal hematoma. Disc levels:  No evidence of high-grade spinal canal stenosis. Upper chest: Clustered nodularity in the right upper lobe is worrisome for an underlying infectious or inflammatory process. There is a small right-sided pleural effusion. Recommend further evaluation with a dedicated chest CT for more definitive characterization. Other: None IMPRESSION: 1. No acute intracranial abnormality. 2. No acute fracture or traumatic malalignment of the cervical spine. 3. Clustered nodularity in the right upper lobe is worrisome for an underlying infectious or inflammatory process. There is a small  right-sided pleural effusion. Recommend further evaluation with a dedicated chest CT for more definitive characterization. Electronically Signed   By: Lorenza Cambridge M.D.   On: 11/17/2022 15:10   DG Chest Port 1 View  Result Date: 11/17/2022 CLINICAL DATA:  Questionable sepsis - evaluate for abnormality EXAM: PORTABLE CHEST 1 VIEW COMPARISON:  CXR 06/19/20 FINDINGS: Asymmetric elevation of the right hemidiaphragm. No pleural effusion. There is a hazy opacity at the right lung base, which could represent atelectasis or infection. No radiographically apparent displaced rib fractures. Visualized upper abdomen is unremarkable. Aortic atherosclerotic calcifications. IMPRESSION: Hazy opacity at the right lung base could represent atelectasis or infection. Electronically Signed   By: Lorenza Cambridge M.D.   On: 11/17/2022 14:08      Signature  -   Susa Raring M.D on 11/13/2022 at 9:44 AM   -  To page go to [email or web address]

## 2022-11-13 NOTE — Plan of Care (Signed)

## 2022-11-13 NOTE — Consult Note (Signed)
Palliative Medicine Inpatient Consult Note  Consulting Provider:  Leroy Sea, MD   Reason for consult:   Palliative Care Consult Services Palliative Medicine Consult  Reason for Consult? Goals of care discussion, primary decision maker girlfriend, wants DNR, patient not eating or drinking well, probably poor candidate for NG oral PEG tube.  Poor quality of life at baseline, bed and wheelchair-bound, goals of care?   11/13/2022  HPI:  Per intake H&P -->  87 year old male with history of Parkinson's disease, pulmonary hypertension, dilated cardiomyopathy, CAD, CKD 3B baseline creatinine around 1.6 who was recently started on nuplacid for his Parkinson's about 10 days ago, he took 6 doses of that medication last being on 11/07/2022.   Palliative care has been asked to get involved to further assist in goals of care conversations based upon chronic co-morbid conditions and poor overall condition.   Clinical Assessment/Goals of Care:  *Please note that this is a verbal dictation therefore any spelling or grammatical errors are due to the "[employer]agon [employer] One" system interpretation.  I have reviewed medical records including EPIC notes, labs and imaging, received report from bedside RN, assessed the patient who is lying in bed in no acute distress.  He is arousable though shares with me he is feeling "terrible".  Does not seem to know where he is or why.   I met with patient's long-term girlfriend, [name] to further discuss diagnosis prognosis, GOC, EOL wishes, disposition and options.   I introduced Palliative Medicine as specialized medical care for people living with serious illness. It focuses on providing relief from the symptoms and stress of a serious illness. The goal is to improve quality of life for both the patient and the family.  Medical History Review and Understanding:  Reviewed Buds history of Parkinson's disease, cardiomyopathy, coronary artery disease, chronic kidney  disease, pulmonary hypertension, and femur fracture.  Social History:  [name] lives in [place] with his long-term girlfriend, [name]  He was married once and does have a daughter with whom he is estranged from.  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  He has a trailer at [name] which in his younger years he loved going to - to go boating and swimming.  [name] has a [place], "[name]" who brings him great joy.  [name] does not have an overt history of faith though [name] does share a friend who is retired was able to come and pray with him yesterday providing reaffirmation.  Functional and Nutritional State:  Preceding hospitalization, [name] had been [name]'s primary caregiver.  She expresses that he has really struggled with falling over the past few years which is caused some difficulties in caring for him. [name] is predominantly in the chair or wheelchair. He is no longer walking on his own.  [name]'s appetite has proved to be challenging as his PD has progressed. He as of most recently has not wanted to eat or drink anything.  Advance Directives:  A detailed discussion was had today regarding advanced directives.  [name]'s surrogate decision maker is his long-term girlfriend, [name]  Code Status:  Concepts specific to code status, artifical feeding and hydration, continued IV antibiotics and rehospitalization was had.  The difference between a aggressive medical intervention path  and a palliative comfort care path for this patient at this time was had.   Encouraged patient/family to consider DNR/DNI status understanding evidenced based poor outcomes in similar hospitalized patient, as  the cause of arrest is likely associated with advanced chronic/terminal illness rather than an easily reversible acute cardio-pulmonary event. I explained that DNR/DNI does not change the medical plan and it only comes  into effect after a person has arrested (died).  It is a protective measure to keep [place] from harming the patient in their last moments of life.   [name] was agreeable to DNR/DNI with understanding that patient would not receive CPR, defibrillation, ACLS medications, or intubation.   [name] is clear that she would not want [name] to have a feeding tube or hemodialysis treatments should either be needed.   Discussion:  I discussed with [name] [name]'s progressive decline over the past few years.  We reviewed that he was diagnosed with Parkinson's disease in 2018 though there were clear signs that he likely had this prior as he used to stare off lacking facial expression.  She review send his diagnoses he has had his ups and downs though he has most notably declined since his femur fracture in 2022.  He got to a point where he could walk with a walker though that has over the past several months worsened to the point whereby he is predominantly wheelchair-bound.Reviewed progressive stages of Parkinson's disease. At this point based upon [name]'s functional state and hallucinations he is likely in stage 5. We further discussed the medications [name] was placed on for control of his symptoms - "nuplacid" which her [name] "only made his hallucinations worse." She shares the WU that was done at home to rule out UTI.   In recent weeks, [name] has apparently suffered various falls. One of which whereby [facility] - "[name]" was there and able to help get him to his bed. [name] expresses a fair amount of caregiver fatigue and expressed that caring for [name] has gotten more difficult. Allowed time for her to express this.   We discussed options from here in regards to [name]'s care and talked about best case and worst case scenarios.   We discuss options such as hospice should [name] continue to decline - right now one of the primary concerns is his lack of oral intake. We discussed with end stage diseases this can often be a precursor to end of  life. In PD reviewed it's often not the disease but something secondary such as pneumonia from dysphagia, a fall, pressure injury, or another infection like a uti which is not caught in time.   If [name] improves then the plan would be for him to transition to [facility] for additional care.  If [name] worsens or neglects to improve the goals would be full comfort care with transition to IP hospice home - [facility].   Plan to allow time for outcomes.   Discussed the importance of continued conversation with family and their  medical providers regarding overall plan of care and treatment options, ensuring decisions are within the context of the patients values and GOCs.  Decision Maker: West,Linda (Significant other): [phone] (Mobile)   SUMMARY OF RECOMMENDATIONS   DNAR/DNI  Allowing time for outcomes  Education on PD  If improved plan for SNF placement - [facility]  If neglects to improve or worsens have discussed comfort focused care as an option  Nursing to support 1:1 feeding  Ongoing Palliative support  Code Status/Advance Care Planning: DNAR/DNI   Palliative Prophylaxis:  Aspiration, Bowel Regimen, Delirium Protocol, Frequent Pain Assessment, Oral Care, Palliative Wound Care, and Turn Reposition  Additional Recommendations (Limitations, Scope, Preferences): Continue current care  Psycho-social/Spiritual:  Desire for further Chaplaincy support: Not presently Additional Recommendations: Education on PD   Prognosis: Progressed end stage PD, poor overall functional score, high chronic disease burden, poor PO tolerance - if neglects to improve would be appropriate for hospice consideration.   Discharge Planning: Discharge plan is uncertain.   Vitals:   11/13/22 0000 11/13/22 0400  BP: 139/67 137/63  Pulse: 80 78  Resp: 14 15  Temp: 97.8 F (36.6 C) 97.7 F (36.5 C)  SpO2: 96% 97%    Intake/Output Summary (Last 24 hours) at 11/13/2022 7829 Last data filed  at 11/13/2022 5621 Gross per 24 hour  Intake --  Output 1800 ml  Net -1800 ml   Last Weight  Most recent update: 11/23/2022 12:25 PM    Weight  87.5 kg (192 lb 14.4 oz)            Gen:  Elderly Caucasian M chronically ill in appearance HEENT: Dry mucous membranes CV: Regular rate and rhythm  PULM: On 2LPM , breathing is even and nonlabored ABD: soft/nontender  EXT: (+) LE edema  Neuro: Aware of self  PPS: 10%   This conversation/these recommendations were discussed with patient primary care team, Dr. Thedore Mins  Total Time: 83 Billing based on MDM: High  Problems Addressed: One acute or chronic illness or injury that poses a threat to life or bodily function  Amount and/or Complexity of Data: Category 3:Discussion of management or test interpretation with external physician/other qualified health care professional/appropriate source (not separately reported)  Risks: Decision regarding hospitalization or escalation of hospital care and Decision not to resuscitate or to de-escalate care because of poor prognosis ______________________________________________________ Lamarr Lulas [place] Palliative Medicine Team Team Cell Phone: [phone] Please utilize secure chat with additional questions, if there is no response within 30 minutes please call the above phone number  Palliative Medicine Team providers are available by phone from 7am to 7pm daily and can be reached through the team cell phone.  Should this patient require assistance outside of these hours, please call the patient's attending physician.

## 2022-11-13 NOTE — Progress Notes (Signed)
   11/13/22 2000  BiPAP/CPAP/SIPAP  BiPAP/CPAP/SIPAP Pt Type Adult  BiPAP/CPAP/SIPAP V60 (on standby)  Reason BIPAP/CPAP not in use Other(comment) (mittens on, no respiratory distress noted.)  BiPAP/CPAP /SiPAP Vitals  Pulse Rate 70  Resp 14  SpO2 98 %  Bilateral Breath Sounds Other (Comment) (coarse)  MEWS Score/Color  MEWS Score 0  MEWS Score Color Green

## 2022-11-13 NOTE — Plan of Care (Signed)

## 2022-11-14 DIAGNOSIS — Z515 Encounter for palliative care: Secondary | ICD-10-CM | POA: Diagnosis not present

## 2022-11-14 DIAGNOSIS — Z7189 Other specified counseling: Secondary | ICD-10-CM | POA: Diagnosis not present

## 2022-11-14 DIAGNOSIS — R4182 Altered mental status, unspecified: Secondary | ICD-10-CM | POA: Diagnosis not present

## 2022-11-14 LAB — COMPREHENSIVE METABOLIC PANEL
ALT: 6 U/L (ref 0–44)
AST: 38 U/L (ref 15–41)
Albumin: 3.4 g/dL — ABNORMAL LOW (ref 3.5–5.0)
Alkaline Phosphatase: 89 U/L (ref 38–126)
Anion gap: 7 (ref 5–15)
BUN: 38 mg/dL — ABNORMAL HIGH (ref 8–23)
CO2: 36 mmol/L — ABNORMAL HIGH (ref 22–32)
Calcium: 9 mg/dL (ref 8.9–10.3)
Chloride: 102 mmol/L (ref 98–111)
Creatinine, Ser: 1.98 mg/dL — ABNORMAL HIGH (ref 0.61–1.24)
GFR, Estimated: 32 mL/min — ABNORMAL LOW (ref >60)
Glucose, Bld: 112 mg/dL — ABNORMAL HIGH (ref 70–99)
Potassium: 3.6 mmol/L (ref 3.5–5.1)
Sodium: 145 mmol/L (ref 135–145)
Total Bilirubin: 2.7 mg/dL — ABNORMAL HIGH (ref 0.3–1.2)
Total Protein: 6.4 g/dL — ABNORMAL LOW (ref 6.5–8.1)

## 2022-11-14 LAB — CBC WITH DIFFERENTIAL/PLATELET
Abs Immature Granulocytes: 0.02 10*3/uL (ref 0.00–0.07)
Basophils Absolute: 0 10*3/uL (ref 0.0–0.1)
Basophils Relative: 1 %
Eosinophils Absolute: 0.2 10*3/uL (ref 0.0–0.5)
Eosinophils Relative: 6 %
HCT: 34.9 % — ABNORMAL LOW (ref 39.0–52.0)
Hemoglobin: 10.3 g/dL — ABNORMAL LOW (ref 13.0–17.0)
Immature Granulocytes: 1 %
Lymphocytes Relative: 15 %
Lymphs Abs: 0.5 10*3/uL — ABNORMAL LOW (ref 0.7–4.0)
MCH: 27.5 pg (ref 26.0–34.0)
MCHC: 29.5 g/dL — ABNORMAL LOW (ref 30.0–36.0)
MCV: 93.3 fL (ref 80.0–100.0)
Monocytes Absolute: 0.4 10*3/uL (ref 0.1–1.0)
Monocytes Relative: 11 %
Neutro Abs: 2.2 10*3/uL (ref 1.7–7.7)
Neutrophils Relative %: 66 %
Platelets: 81 10*3/uL — ABNORMAL LOW (ref 150–400)
RBC: 3.74 MIL/uL — ABNORMAL LOW (ref 4.22–5.81)
RDW: 17.7 % — ABNORMAL HIGH (ref 11.5–15.5)
WBC: 3.3 10*3/uL — ABNORMAL LOW (ref 4.0–10.5)
nRBC: 0 % (ref 0.0–0.2)

## 2022-11-14 LAB — CULTURE, BLOOD (ROUTINE X 2)
Culture: NO GROWTH
Culture: NO GROWTH

## 2022-11-14 LAB — C-REACTIVE PROTEIN: CRP: 1.8 mg/dL — ABNORMAL HIGH (ref <1.0)

## 2022-11-14 LAB — BRAIN NATRIURETIC PEPTIDE: B Natriuretic Peptide: 542.5 pg/mL — ABNORMAL HIGH (ref 0.0–100.0)

## 2022-11-14 LAB — MAGNESIUM: Magnesium: 2.3 mg/dL (ref 1.7–2.4)

## 2022-11-14 LAB — PROCALCITONIN: Procalcitonin: 0.14 ng/mL

## 2022-11-14 MED ORDER — FUROSEMIDE 10 MG/ML IJ SOLN: 40.0000 mg | Freq: Every day | INTRAMUSCULAR | Status: DC

## 2022-11-14 MED ORDER — ALBUMIN HUMAN 25 % IV SOLN
50.0000 g | Freq: Two times a day (BID) | INTRAVENOUS | Status: AC
  Administered 2022-11-14 (×2): 50 g via INTRAVENOUS
  Filled 2022-11-14 (×2): qty 200

## 2022-11-14 MED ORDER — POTASSIUM CL IN DEXTROSE 5% 20 MEQ/L IV SOLN
20.0000 meq | INTRAVENOUS | Status: AC
  Filled 2022-11-14: qty 1000

## 2022-11-14 MED ORDER — POTASSIUM CHLORIDE 20 MEQ PO PACK
40.0000 meq | PACK | Freq: Once | ORAL | Status: AC
  Administered 2022-11-14: 40 meq via ORAL
  Filled 2022-11-14: qty 2

## 2022-11-14 NOTE — Progress Notes (Signed)
Patient wearing oxygen set at 2lpm with Sp02=97%. Patient shows no signs of respiratory distress. Bipap on standby

## 2022-11-14 NOTE — Progress Notes (Signed)
PROGRESS NOTE                                                                                                                                                                                                             Patient Demographics:    [name], is a 87 y.o. male, DOB - April 25, 1935, ZOX:[id number]  Outpatient Primary MD for the patient is [name] Brunette, MD    LOS - 5  Admit date - 11/13/2022    Chief Complaint  Patient presents with   Failure To Thrive       Brief Narrative (HPI from H&P)     87 year old male with history of Parkinson's disease, pulmonary hypertension, dilated cardiomyopathy, CAD, CKD 3B baseline creatinine around 1.6 who was recently started on nuplacid for his Parkinson's about 10 days ago, he took 6 doses of that medication last being on 11/07/2022.  Per caregiver since the start of this medication he has been getting increasingly weak and confused, he is usually wheelchair-bound and cannot walk a little bit but became increasingly more lethargic and short of breath.    Brought to the ER where he was found to have hypoxic respiratory failure, lethargy, CT chest with questionable pneumonia, CT head nonacute, initially required nasal cannula oxygen however overnight became more hypoxic requiring BiPAP with 100% FiO2, transferred to my care on 11/10/2022.  Currently he is obtunded, on BiPAP with 100% FiO2.  Exam reveals 3+ leg edema and crackles, stable WBC, negative procalcitonin and ABG suggests severe hypoxic respiratory failure.   Subjective:   Patient in bed, answers a few questions at times with yes or no, denies any headache or chest pain.   Assessment  & Plan :    Acute hypoxic respiratory failure despite being on CPAP with initial FiO2 30% and IPAP 30. His exam reveals massive fluid overload, bibasilar crackles, CT chest is of possible pneumonia however no fever, no white count no  procalcitonin.  He was placed on BiPAP, diuresed with IV Lasix, antibiotics titrated down, seen by PCCM.  He is DNR, continue medical treatment, significant improvement on 11/11/2022 his pulmonary status but encephalopathy continues to be quite significant, continue as needed Lasix with protein supplementation and IV albumin if needed, continue present antibiotics, supplemental oxygen, I-S flutter valve for pulmonary toiletry and monitor.  Seen by pulmonary this admission  as well.  Worsening encephalopathy.  Due to combination of hypoxia now, underlying Parkinson's and recent exposure to Nuplacid - CT head unremarkable, no focal deficits, holding Nuplazid, oxygen as above, stable EEG, with offending medication outpatient improving.  Neurology on board.  B12 was borderline hence replaced, stable folic acid, markable TSH, B1 pending.  Low normal vitamin B12 levels.  Replaced.    HX of Parkinson's.  Currently continuing home medication Sinemet, PT OT and speech eval, seen by neurology this admission.  Offending medication Nuplazid held.  AKI on CKD 3B with hypernatremia.  Baseline creatinine 1.6, AKI in the setting of massive fluid overload, however due to poor oral intake and protein calorie malnutrition most of the fluid is third spaced and he is intravascularly getting dehydrated, oral protein supplementation, poor candidate for NG tube or PEG tube, diuretics as tolerated, nephrology on board.  Encouraged free water intake.  Improvement with albumin and Lasix continue on 11/14/2022.  Severe PCM.  Protein supplements.    Paroxysmal atrial fibrillation [place] vas 2 score greater than 3.  On Eliquis, continue as tolerated, low-dose beta-blocker scheduled oral and IV and as needed.  Massive lower extremity edema.  Diurese.  Nonspecific CT cervical spine changes-CT cervical spine showed grade 1 anterolisthesis of C3 on C4 and C4 and C5, trace retrolisthesis of C5 on C6. [name]  Outpatient neurosurgery follow-up.   No neck pain.   Extremely poor baseline functional status, not eating or drinking for several days, girlfriend confirms DNR and wants to talk to palliative care for goals of care palliative care on board.  Plan as of 11/14/2022 after detailed discussion with patient's girlfriend is to continue present line of care as she is pretty certain that patient's decline was significant after Nuplacid was initiated 10 days ago, it has a long half-life so we are trying our best to let it run its course and make sure we are not dealing with a reversible cause of his sudden decline.  If there is no improvement in the next 3 to 4 days then hospice and comfort measures likely around 9/19 on 11/18/2022.      Condition - Extremely Guarded  Family Communication  : Family decision maker girlfriend [name] [phone] on 11/10/2022, message left 11/11/2022 9:30 AM, called 11/12/2022 at 8:45 AM, going to answering machine, x 3, updated girlfriend 11/12/2022, 11/13/2022 bedside.  Over the phone 11/14/2022.  Code Status :  DNR-I  Consults  : PCCM, neurology, nephrology, palliative care  PUD Prophylaxis :     Procedures  :     CT Chest - 1. Chronic elevation of right diaphragm. 2. Small right greater than left pleural effusions. 3. Consolidation with air bronchograms in the right lower lobe, slightly progressive compared to the lung bases on prior CT. Heterogeneous consolidation and ground-glass density in the right upper lobe, findings are suspicious for pneumonia. 4. Aortic atherosclerosis. Aortic Atherosclerosis (ICD10-I70.0).  CT head and C Spine - 1. No acute intracranial abnormality. 2. No acute fracture or traumatic malalignment of the cervical spine. 3. Clustered nodularity in the right upper lobe is worrisome for an underlying infectious or inflammatory process. There is a small right-sided pleural effusion. Recommend further evaluation with a dedicated chest CT for more definitive characterization.       Disposition Plan  :    Status is: Inpatient  DVT Prophylaxis  :    apixaban (ELIQUIS) tablet 2.5 mg Start: 11/15/2022 2200 apixaban (ELIQUIS) tablet 2.5 mg     Lab Results  Component Value Date   PLT 81 (L) 11/14/2022    Diet :  Diet Order             DIET DYS 3 Room service appropriate? No; Fluid consistency: Thin  Diet effective now                    Inpatient Medications  Scheduled Meds:  (feeding supplement) PROSource Plus  30 mL Oral BID BM   apixaban  2.5 mg Oral BID   carbidopa-levodopa  2 tablet Oral TID   cyanocobalamin  1,000 mcg Subcutaneous Daily   [START ON 11/20/2022] vitamin B-12  1,000 mcg Oral Daily   feeding supplement  237 mL Oral BID BM   [START ON 11/15/2022] furosemide  40 mg Intravenous Daily   ipratropium-albuterol  3 mL Nebulization BID   lactose free nutrition  237 mL Oral TID WC   potassium chloride  40 mEq Oral Once   Continuous Infusions:  albumin human     ampicillin-sulbactam (UNASYN) IV 3 g (11/13/22 2210)   dextrose 5 % with KCl 20 mEq / L     PRN Meds:.acetaminophen **OR** acetaminophen, ipratropium-albuterol, ondansetron **OR** ondansetron (ZOFRAN) IV    Objective:   Vitals:   11/13/22 2011 11/14/22 0051 11/14/22 0400 11/14/22 0800  BP: 133/84 (!) 140/75 (!) 152/80   Pulse: 77 83 78   Resp: 16 20 18    Temp: 98 F (36.7 C)  99.1 F (37.3 C) 98.5 F (36.9 C)  TempSrc: Axillary  Axillary Axillary  SpO2: 98% 91% 99%   Weight:      Height:        Wt Readings from Last 3 Encounters:  2022/11/12 87.5 kg  07/08/22 87.5 kg  06/22/22 89.4 kg     Intake/Output Summary (Last 24 hours) at 11/14/2022 0928 Last data filed at 11/14/2022 0524 Gross per 24 hour  Intake --  Output 2100 ml  Net -2100 ml     Physical Exam  Awake, more alert this morning, following basic commands, on nasal cannula oxygen this morning, [place].AT,PERRAL Supple Neck, No JVD,   Symmetrical Chest wall movement, Good air movement bilaterally,  bilateral crackles RRR,No Gallops,Rubs or new Murmurs,  +ve B.Sounds, Abd Soft, No tenderness,   2+ leg edema    Data Review:    Recent Labs  Lab 2022/11/12 1300 Nov 12, 2022 1316 11/10/22 0045 11/11/22 1135 11/12/22 0430 11/13/22 0913 11/14/22 0445  WBC 4.3  --  4.3 5.0 4.4 4.3 3.3*  HGB 12.2*   < > 11.3* 11.9* 11.3* 11.7* 10.3*  HCT 40.9   < > 38.3* 40.4 38.2* 39.3 34.9*  PLT 109*  --  115* 120* 113* 94* 81*  MCV 93.0  --  92.5 92.4 91.4 95.2 93.3  MCH 27.7  --  27.3 27.2 27.0 28.3 27.5  MCHC 29.8*  --  29.5* 29.5* 29.6* 29.8* 29.5*  RDW 18.2*  --  18.2* 18.3* 18.2* 18.1* 17.7*  LYMPHSABS 0.3*  --   --  0.6* 0.5* 0.8 0.5*  MONOABS 0.3  --   --  0.5 0.6 0.6 0.4  EOSABS 0.1  --   --  0.1 0.1 0.2 0.2  BASOSABS 0.0  --   --  0.1 0.0 0.0 0.0   < > = values in this interval not displayed.    Recent Labs  Lab 11/12/22 1300 2022-11-12 1311 2022-11-12 1316 2022/11/12 1545 11/10/22 0045 11/10/22 1610 11/10/22 0630 11/10/22 1208 11/10/22 1636 11/11/22 1135 11/12/22 0430  11/13/22 0913 11/14/22 0445  NA 142  --    < >  --  143  --   --   --  148* 150* 151* 147* 145  K 4.6  --    < >  --  4.2  --   --   --  3.5 3.1* 3.4* 3.9 3.6  CL 97*  --    < >  --  99  --   --   --  103 100 102 103 102  CO2 31  --   --   --  29  --   --   --  33* 35* 37* 36* 36*  ANIONGAP 14  --   --   --  15  --   --   --  12 15 12 8 7   GLUCOSE 80  --    < >  --  82  --   --   --  78 72 107* 94 112*  BUN 54*  --    < >  --  50*  --   --   --  49* 47* 45* 41* 38*  CREATININE 2.52*  --    < >  --  2.56*  --   --   --  2.44* 2.49* 2.48* 2.18* 1.98*  AST 25  --   --   --  20  --   --   --   --  15 15 14* 38  ALT 7  --   --   --  10  --   --   --   --  13 5 6 6   ALKPHOS 144*  --   --   --  131*  --   --   --   --  147* 127* 121 89  BILITOT 1.7*  --   --   --  2.1*  --   --   --   --  2.5* 2.4* 2.4* 2.7*  ALBUMIN 3.3*  --   --   --  3.1*  --   --   --   --  3.1* 2.8* 2.7* 3.4*  CRP  --   --   --   --   --   --   <0.5  --   --  0.9 1.3* 1.7* 1.8*  PROCALCITON  --   --    < >  --   --   --  0.11  --   --  0.16 0.16 0.18 0.14  LATICACIDVEN  --  1.2  --  1.4  --   --   --   --   --   --   --   --   --   INR 1.5*  --   --   --   --   --   --   --   --   --   --   --   --   TSH  --   --   --   --   --   --   --   --   --   --   --  6.222*  --   AMMONIA  --   --   --   --   --   --   --  32  --   --   --   --   --   BNP  --   --   --   --   --  305.9*  --   --   --  484.6* 679.0* 426.5* 542.5*  MG  --   --   --   --   --   --   --   --   --  2.4 2.4 2.5* 2.3  CALCIUM 9.5  --   --   --  9.0  --   --   --  9.0 9.3 9.3 8.8* 9.0   < > = values in this interval not displayed.      Recent Labs  Lab 10/30/2022 1300 11/15/2022 1311 11/21/2022 1528 11/17/2022 1545 11/10/22 0045 11/10/22 7253 11/10/22 0630 11/10/22 1208 11/10/22 1636 11/11/22 1135 11/12/22 0430 11/13/22 0913 11/14/22 0445  CRP  --   --   --   --   --   --  <0.5  --   --  0.9 1.3* 1.7* 1.8*  PROCALCITON  --   --    < >  --   --   --  0.11  --   --  0.16 0.16 0.18 0.14  LATICACIDVEN  --  1.2  --  1.4  --   --   --   --   --   --   --   --   --   INR 1.5*  --   --   --   --   --   --   --   --   --   --   --   --   TSH  --   --   --   --   --   --   --   --   --   --   --  6.222*  --   AMMONIA  --   --   --   --   --   --   --  32  --   --   --   --   --   BNP  --   --   --   --   --  305.9*  --   --   --  484.6* 679.0* 426.5* 542.5*  MG  --   --   --   --   --   --   --   --   --  2.4 2.4 2.5* 2.3  CALCIUM 9.5  --   --   --    < >  --   --   --  9.0 9.3 9.3 8.8* 9.0   < > = values in this interval not displayed.   Lab Results  Component Value Date   TSH 6.222 (H) 11/13/2022     Micro Results Recent Results (from the past 240 hour(s))  Resp panel by RT-PCR (RSV, Flu A&B, Covid) Anterior Nasal Swab     Status: None   Collection Time: 11/13/2022 12:44 PM   Specimen: Anterior Nasal Swab  Result Value Ref Range Status   SARS Coronavirus 2  by RT PCR NEGATIVE NEGATIVE Final   Influenza A by PCR NEGATIVE NEGATIVE Final   Influenza B by PCR NEGATIVE NEGATIVE Final    Comment: (NOTE) The Xpert Xpress SARS-CoV-2/FLU/RSV plus assay is intended as an aid in the diagnosis of influenza from Nasopharyngeal swab specimens and should not be used as a sole basis for treatment. Nasal washings and aspirates are unacceptable for Xpert Xpress SARS-CoV-2/FLU/RSV testing.  Fact Sheet for Patients: BloggerCourse.com  Fact Sheet for Healthcare Providers: SeriousBroker.it  This test is  not yet approved or cleared by the [place] and has been authorized for detection and/or diagnosis of SARS-CoV-2 by FDA under an Emergency Use Authorization (EUA). This EUA will remain in effect (meaning this test can be used) for the duration of the COVID-19 declaration under Section 564(b)(1) of the Act, 21 U.S.C. section 360bbb-3(b)(1), unless the authorization is terminated or revoked.     Resp Syncytial Virus by PCR NEGATIVE NEGATIVE Final    Comment: (NOTE) Fact Sheet for Patients: BloggerCourse.com  Fact Sheet for Healthcare Providers: SeriousBroker.it  This test is not yet approved or cleared by the [place] FDA and has been authorized for detection and/or diagnosis of SARS-CoV-2 by FDA under an Emergency Use Authorization (EUA). This EUA will remain in effect (meaning this test can be used) for the duration of the COVID-19 declaration under Section 564(b)(1) of the Act, 21 U.S.C. section 360bbb-3(b)(1), unless the authorization is terminated or revoked.  Performed at [facility], [address], [place], [place] [address]   Blood Culture (routine x 2)     Status: None   Collection Time: 11/19/2022  1:00 PM   Specimen: BLOOD RIGHT FOREARM  Result Value Ref Range Status   Specimen Description BLOOD RIGHT FOREARM  Final    Special Requests   Final    BOTTLES DRAWN AEROBIC AND ANAEROBIC Blood Culture results may not be optimal due to an excessive volume of blood received in culture bottles   Culture   Final    NO GROWTH 5 DAYS Performed at [facility], [address], [place], [place] [address]    Report Status 11/14/2022 FINAL  Final  Blood Culture (routine x 2)     Status: None   Collection Time: 11/25/2022  1:17 PM   Specimen: BLOOD  Result Value Ref Range Status   Specimen Description BLOOD LEFT ANTECUBITAL  Final   Special Requests   Final    BOTTLES DRAWN AEROBIC AND ANAEROBIC Blood Culture results may not be optimal due to an excessive volume of blood received in culture bottles   Culture   Final    NO GROWTH 5 DAYS Performed at [facility], [address] [place], [place] [address]    Report Status 11/14/2022 FINAL  Final    Radiology Reports DG Chest Port 1 View  Result Date: 11/12/2022 CLINICAL DATA:  Shortness of breath. EXAM: PORTABLE CHEST 1 VIEW COMPARISON:  11/11/2022 FINDINGS: Cardiac enlargement is stable. Aortic atherosclerotic calcifications. Asymmetric elevation of the right hemidiaphragm is unchanged. Persistent right pleural effusion with veil like opacification over the right midlung. Right lower lobe atelectasis and consolidation and patchy airspace densities in the right upper lobe are unchanged. Stable mild subsegmental atelectasis in the left base. IMPRESSION: 1. No significant interval change. 2. Persistent right pleural effusion with veil like opacification over the right midlung. 3. Right lower lobe atelectasis and consolidation and patchy airspace densities in the right upper lobe are unchanged. Electronically Signed   By: Signa Kell M.D.   On: 11/12/2022 08:14   DG Chest Port 1 View  Result Date: 11/11/2022 CLINICAL DATA:  Shortness of breath and altered mental status. EXAM: PORTABLE CHEST 1 VIEW COMPARISON:  11/10/2022 FINDINGS: Asymmetric elevation right  hemidiaphragm with persistent right base collapse/consolidation. Vascular congestion with diffuse interstitial prominence. The cardio pericardial silhouette is enlarged. Bones are diffusely demineralized. Telemetry leads overlie the chest. IMPRESSION: 1. Persistent right base collapse/consolidation. 2. Vascular congestion with diffuse interstitial prominence, likely  chronic. Electronically Signed   By: Kennith Center M.D.   On: 11/11/2022 07:56   EEG adult  Result Date: 11/10/2022 Charlsie Quest, MD     11/10/2022  2:43 PM Patient Name: Jefferson Hebeler MRN: [id number] Epilepsy Attending: Charlsie Quest Referring Physician/Provider: Leroy Sea, MD Date: 11/10/2022 Duration: 26.03 mins Patient history: 87yo m with ams getting eeg to evaluate for seizure Level of alertness: Awake, asleep AEDs during EEG study: Ativan Technical aspects: This EEG study was done with scalp electrodes positioned according to the 10-20 International system of electrode placement. Electrical activity was reviewed with band pass filter of 1-70Hz , sensitivity of 7 uV/mm, display speed of 29mm/sec with a 60Hz  notched filter applied as appropriate. EEG data were recorded continuously and digitally stored.  Video monitoring was available and reviewed as appropriate. Description: No posterior dominant rhythm was seen. Sleep was characterized by  sleep spindles (12 to 14 Hz), maximal frontocentral region. EEG showed continuous generalized 3 to 6 Hz theta-delta slowing. Hyperventilation and photic stimulation were not performed.   ABNORMALITY - Continuous slow, generalized IMPRESSION: This study is suggestive of moderate diffuse encephalopathy. No seizures or epileptiform discharges were seen throughout the recording. Charlsie Quest      Signature  -   Susa Raring M.D on 11/14/2022 at 9:28 AM   -  To page go to [email or web address]

## 2022-11-14 NOTE — Progress Notes (Signed)
Ok to add stop date 9/17 to unasyn to complete 7d of therapy per Dr. Thedore Mins.  Ulyses Southward, PharmD, BCIDP, AAHIVP, CPP Infectious Disease Pharmacist 11/14/2022 12:27 PM

## 2022-11-14 NOTE — Discharge Instructions (Addendum)

## 2022-11-14 NOTE — Progress Notes (Signed)
Palliative Medicine Inpatient Follow Up Note HPI: 87 year old male with history of Parkinson's disease, pulmonary hypertension, dilated cardiomyopathy, CAD, CKD 3B baseline creatinine around 1.6 who was recently started on nuplacid for his Parkinson's about 10 days ago, he took 6 doses of that medication last being on 11/07/2022.    Palliative care has been asked to get involved to further assist in goals of care conversations based upon chronic co-morbid conditions and poor overall condition.   Today's Discussion 11/14/2022  *Please note that this is a verbal dictation therefore any spelling or grammatical errors are due to the "Dragon Medical One" system interpretation.  Chart reviewed inclusive of vital signs, progress notes, laboratory results, and diagnostic images.   I spoke to patients RN who shares that [name] has been mostly sleeping.   I met with [name] at bedside this morning. He was noted to be sleeping soundly. I did try to arouse him and he fluttered his eyes though did not open them.  Remains to have little to no oral intake.  ____________  I checked in this afternoon on [name] he remains somnolent with not great changes since this morning.  I called patients long time girlfriend though reached VM. I will try at a later time.  Per primary team continue a time limited trial of interventions.   Questions and concerns addressed/Palliative Support Provided.   Objective Assessment: Vital Signs Vitals:   11/14/22 0831 11/14/22 1200  BP:  132/67  Pulse: 75 81  Resp: 14 15  Temp:  98 F (36.7 C)  SpO2: 99% 97%    Intake/Output Summary (Last 24 hours) at 11/14/2022 1520 Last data filed at 11/14/2022 0524 Gross per 24 hour  Intake --  Output 1800 ml  Net -1800 ml   Last Weight  Most recent update: 11/28/2022 12:25 PM    Weight  87.5 kg (192 lb 14.4 oz)            Gen:  Elderly Caucasian M chronically ill in appearance HEENT: Dry mucous membranes CV: Regular rate and  rhythm  PULM: On 2LPM [place], breathing is even and nonlabored ABD: soft/nontender  EXT: (+) LE edema  Neuro: Somnolent  SUMMARY OF RECOMMENDATIONS   DNAR/DNI   Allowing time for outcomes    Nursing to support 1:1 feeding  If no improvements by the end of the week primary team will continue with conversations related to comfort care   Ongoing Palliative support  Billing based on MDM: Low ______________________________________________________________________________________ Lamarr Lulas [facility] Palliative Medicine Team Team Cell Phone: [phone] Please utilize secure chat with additional questions, if there is no response within 30 minutes please call the above phone number  Palliative Medicine Team providers are available by phone from 7am to 7pm daily and can be reached through the team cell phone.  Should this patient require assistance outside of these hours, please call the patient's attending physician.

## 2022-11-14 NOTE — Progress Notes (Signed)
Speech Language Pathology Treatment: Dysphagia  Patient Details Name: [name] MRN: [id number] DOB: Oct 30, 1935 Today's Date: 11/14/2022 Time: 4098-1191 SLP Time Calculation (min) (ACUTE ONLY): 18 min  Assessment / Plan / Recommendation Clinical Impression  Pt seen for dysphagia f/u tx session with decreased alertness/interaction this session.  Wife present throughout session and noted change in pt's responsiveness.  Pt required mod verbal/tactile cues during intake to initiate swallow and compensatory strategies (ie:tsp amounts of thin, mod cueing, etc) required during small trial of puree, ice chips and tsp amounts of thin.  Pt required max cues to participate in oral care prior to intake with excess saliva pooling in buccal cavities and oral (lateral labial) residue noted from prior intake.  Pt responded with low vocal intensity with single words that were decreased in intelligibility.  Pt orally held all boluses without mod verbal/tactile cueing provided by SLP/wife.  Pt did masticate ice chips volitionally with a delay in the initiation of the swallow observed cumulatively.  Recommend continuing current diet (Dysphagia 3/thin;pt preferred) with tsp amounts or small sips via cup only and FULL supervision/staff available to feed pt.  Discussed with wife providing pt with softer consistencies and mod verbal/tactile cues during all po intake while pt's mentation decreased during po intake.  ST will f/u for diet tolerance/education re: safest diet consistency and compensatory strategies associated with diet.     HPI HPI: [name] is a 87 y.o. male, with past medical history of Parkinson disease (dx in 2017), pulmonary hypertension, dilated cardiomyopathy, CAD, CKD stage III b, who was brought to the ED for worsening hallucinations and generalized weakness on 11/08/2022.  In the ED patient was found to be hypothermic, Chest x-ray showed hazy infiltrate at right lung base. Bipap needs  intermittently during hospitalization.  ST recommended Dysphagia 3/thin without straws on 11/13/22.  ST f/u for diet tolerance.      SLP Plan  Continue with current plan of care      Recommendations for follow up therapy are one component of a multi-disciplinary discharge planning process, led by the attending physician.  Recommendations may be updated based on patient status, additional functional criteria and insurance authorization.    Recommendations  Diet recommendations: Dysphagia 3 (mechanical soft);Thin liquid Liquids provided via: Cup;Teaspoon Medication Administration: Crushed with puree Supervision: Full supervision/cueing for compensatory strategies;Trained caregiver to feed patient Compensations: Slow rate;Small sips/bites;Lingual sweep for clearance of pocketing;Other (Comment) (wait for swallow initiation prior to next bite/sip) Postural Changes and/or Swallow Maneuvers: Seated upright 90 degrees                  Oral care BID;Staff/trained caregiver to provide oral care   Frequent or constant Supervision/Assistance Dysphagia, unspecified (R13.10)     Continue with current plan of care     Pat [name],M.S.,CCC-SLP  11/14/2022, 11:49 AM

## 2022-11-15 ENCOUNTER — Inpatient Hospital Stay (HOSPITAL_COMMUNITY): Payer: Medicare Other

## 2022-11-15 DIAGNOSIS — G9341 Metabolic encephalopathy: Secondary | ICD-10-CM | POA: Diagnosis not present

## 2022-11-15 DIAGNOSIS — R4182 Altered mental status, unspecified: Secondary | ICD-10-CM | POA: Diagnosis not present

## 2022-11-15 LAB — COMPREHENSIVE METABOLIC PANEL
ALT: 6 U/L (ref 0–44)
AST: 83 U/L — ABNORMAL HIGH (ref 15–41)
Albumin: 4.2 g/dL (ref 3.5–5.0)
Alkaline Phosphatase: 116 U/L (ref 38–126)
Anion gap: 10 (ref 5–15)
BUN: 41 mg/dL — ABNORMAL HIGH (ref 8–23)
CO2: 35 mmol/L — ABNORMAL HIGH (ref 22–32)
Calcium: 9.3 mg/dL (ref 8.9–10.3)
Chloride: 103 mmol/L (ref 98–111)
Creatinine, Ser: 1.97 mg/dL — ABNORMAL HIGH (ref 0.61–1.24)
GFR, Estimated: 33 mL/min — ABNORMAL LOW (ref >60)
Glucose, Bld: 94 mg/dL (ref 70–99)
Potassium: 3.9 mmol/L (ref 3.5–5.1)
Sodium: 148 mmol/L — ABNORMAL HIGH (ref 135–145)
Total Bilirubin: 3.1 mg/dL — ABNORMAL HIGH (ref 0.3–1.2)
Total Protein: 6.9 g/dL (ref 6.5–8.1)

## 2022-11-15 LAB — BLOOD GAS, VENOUS
Acid-Base Excess: 12.9 mmol/L — ABNORMAL HIGH (ref 0.0–2.0)
Bicarbonate: 41.7 mmol/L — ABNORMAL HIGH (ref 20.0–28.0)
Drawn by: 67249
O2 Saturation: 98.5 %
Patient temperature: 37.8
pCO2, Ven: 82 mmHg (ref 44–60)
pH, Ven: 7.32 (ref 7.25–7.43)
pO2, Ven: 112 mmHg — ABNORMAL HIGH (ref 32–45)

## 2022-11-15 LAB — CBC WITH DIFFERENTIAL/PLATELET
Abs Immature Granulocytes: 0.01 10*3/uL (ref 0.00–0.07)
Basophils Absolute: 0 10*3/uL (ref 0.0–0.1)
Basophils Relative: 1 %
Eosinophils Absolute: 0.2 10*3/uL (ref 0.0–0.5)
Eosinophils Relative: 5 %
HCT: 36.4 % — ABNORMAL LOW (ref 39.0–52.0)
Hemoglobin: 10.5 g/dL — ABNORMAL LOW (ref 13.0–17.0)
Immature Granulocytes: 0 %
Lymphocytes Relative: 13 %
Lymphs Abs: 0.5 10*3/uL — ABNORMAL LOW (ref 0.7–4.0)
MCH: 28 pg (ref 26.0–34.0)
MCHC: 28.8 g/dL — ABNORMAL LOW (ref 30.0–36.0)
MCV: 97.1 fL (ref 80.0–100.0)
Monocytes Absolute: 0.3 10*3/uL (ref 0.1–1.0)
Monocytes Relative: 8 %
Neutro Abs: 2.9 10*3/uL (ref 1.7–7.7)
Neutrophils Relative %: 73 %
Platelets: 83 10*3/uL — ABNORMAL LOW (ref 150–400)
RBC: 3.75 MIL/uL — ABNORMAL LOW (ref 4.22–5.81)
RDW: 17.6 % — ABNORMAL HIGH (ref 11.5–15.5)
WBC: 4 10*3/uL (ref 4.0–10.5)
nRBC: 0 % (ref 0.0–0.2)

## 2022-11-15 LAB — BRAIN NATRIURETIC PEPTIDE: B Natriuretic Peptide: 656.8 pg/mL — ABNORMAL HIGH (ref 0.0–100.0)

## 2022-11-15 LAB — VITAMIN B1: Vitamin B1 (Thiamine): 103.3 nmol/L (ref 66.5–200.0)

## 2022-11-15 LAB — MAGNESIUM: Magnesium: 2.6 mg/dL — ABNORMAL HIGH (ref 1.7–2.4)

## 2022-11-15 LAB — T4, FREE: Free T4: 0.89 ng/dL (ref 0.61–1.12)

## 2022-11-15 LAB — PROCALCITONIN: Procalcitonin: 0.17 ng/mL

## 2022-11-15 LAB — C-REACTIVE PROTEIN: CRP: 2.5 mg/dL — ABNORMAL HIGH (ref <1.0)

## 2022-11-15 MED ORDER — ALBUMIN HUMAN 25 % IV SOLN
50.0000 g | Freq: Two times a day (BID) | INTRAVENOUS | Status: AC
  Administered 2022-11-15: 50 g via INTRAVENOUS
  Filled 2022-11-15: qty 200

## 2022-11-15 MED ORDER — FREE WATER: 200.0000 mL | Freq: Three times a day (TID) | Status: DC

## 2022-11-15 MED ORDER — FUROSEMIDE 10 MG/ML IJ SOLN
40.0000 mg | Freq: Once | INTRAMUSCULAR | Status: AC
  Administered 2022-11-15: 40 mg via INTRAVENOUS
  Filled 2022-11-15: qty 4

## 2022-11-15 MED ORDER — FREE WATER
250.0000 mL | Freq: Four times a day (QID) | Status: DC
  Administered 2022-11-16: 250 mL

## 2022-11-15 MED ORDER — DEXTROSE 5 % IV SOLN: INTRAVENOUS | Status: DC

## 2022-11-15 MED ORDER — FUROSEMIDE 10 MG/ML IJ SOLN
20.0000 mg | Freq: Once | INTRAMUSCULAR | Status: AC
  Administered 2022-11-15: 20 mg via INTRAVENOUS
  Filled 2022-11-15: qty 2

## 2022-11-15 MED ORDER — HYDRALAZINE HCL 20 MG/ML IJ SOLN: 10.0000 mg | Freq: Four times a day (QID) | INTRAMUSCULAR | Status: DC | PRN

## 2022-11-15 NOTE — Progress Notes (Signed)
PROGRESS NOTE                                                                                                                                                                                                             Patient Demographics:    [name], is a 87 y.o. male, DOB - 04/20/1935, HBZ:[id number]  Outpatient Primary MD for the patient is Merri Brunette, MD    LOS - 6  Admit date - 11-Nov-2022    Chief Complaint  Patient presents with   Failure To Thrive       Brief Narrative (HPI from H&P)     87 year old male with history of Parkinson's disease, pulmonary hypertension, dilated cardiomyopathy, CAD, CKD 3B baseline creatinine around 1.6 who was recently started on nuplacid for his Parkinson's about 10 days ago, he took 6 doses of that medication last being on 11/07/2022.  Per caregiver since the start of this medication he has been getting increasingly weak and confused, he is usually wheelchair-bound and cannot walk a little bit but became increasingly more lethargic and short of breath.    Brought to the ER where he was found to have hypoxic respiratory failure, lethargy, CT chest with questionable pneumonia, CT head nonacute, initially required nasal cannula oxygen however overnight became more hypoxic requiring BiPAP with 100% FiO2, transferred to my care on 11/10/2022.  Currently he is obtunded, on BiPAP with 100% FiO2.  Exam reveals 3+ leg edema and crackles, stable WBC, negative procalcitonin and ABG suggests severe hypoxic respiratory failure.   Subjective:   Patient in bed, appears comfortable, denies any headache, no fever, no chest pain or pressure, no shortness of breath , no abdominal pain. No new focal weakness.   Assessment  & Plan :   Acute hypoxic respiratory failure despite being on CPAP with initial FiO2 30% and IPAP 30. His exam reveals massive fluid overload, bibasilar crackles, CT chest is of  possible pneumonia however no fever, no white count no procalcitonin.  He was placed on BiPAP, diuresed with IV Lasix, antibiotics titrated down, seen by PCCM.  He is DNR, continue medical treatment, significant improvement on 11/11/2022 his pulmonary status but encephalopathy continues to be quite significant, continue as needed Lasix with protein supplementation and IV albumin if needed, continue present antibiotics, supplemental oxygen, I-S flutter valve for pulmonary toiletry and  monitor.  Seen by pulmonary this admission as well.  Worsening encephalopathy.  Due to combination of hypoxia now, underlying Parkinson's and recent exposure to Nuplacid - CT and EEG unremarkable, no focal deficits, stable TSH, stable B1, B12 was low normal and is being replaced, holding the potential Nuplazid, oxygenation has improved, seen by neurology, requested to see again on 11/15/2022.  Unfortunately  Nuplacid has a very long half-life and the active metabolites can stay in the body for up to a month according to the pharmacy.  Girlfriend is very clear that although patient did not have a very good quality of life but his decline was very rapid and immediately after the first dose of this medication.  Long discussions with neurologist Dr. Amada Jupiter, patient's girlfriend it has been decided that we will drop an NG tube, initiate nutrition, continue supportive care for another 2 weeks, he will remain DNR.  If there is significant decline in the meantime then we will transition to full comfort measures.  If he does not improve after 2 weeks then we will move to comfort measures however if he shows improvement then we will proceed towards SNF and advancement of supportive care.  Low normal vitamin B12 levels.  Replaced.    HX of Parkinson's.  Currently continuing home medication Sinemet, PT OT and speech eval, seen by neurology this admission.  Offending medication Nuplazid held.  AKI on CKD 3B with hypernatremia.  Baseline  creatinine 1.6, AKI in the setting of massive fluid overload, however due to poor oral intake and protein calorie malnutrition most of the fluid is third spaced and he is intravascularly getting dehydrated, oral protein supplementation, poor candidate for NG tube or PEG tube, diuretics as tolerated, nephrology on board.  Encouraged free water intake.  Improvement with albumin and Lasix continue on 11/14/2022.  Severe PCM.  Protein supplements.    Paroxysmal atrial fibrillation [place] vas 2 score greater than 3.  On Eliquis, continue as tolerated, low-dose beta-blocker scheduled oral and IV and as needed.  Massive lower extremity edema.  Diurese with Lasix albumin as tolerated, lower extremity edema significantly improved.  Nonspecific CT cervical spine changes-CT cervical spine showed grade 1 anterolisthesis of C3 on C4 and C4 and C5, trace retrolisthesis of C5 on C6. [name]  Outpatient neurosurgery follow-up.  No neck pain.       Condition - Extremely Guarded  Family Communication  : Family decision maker girlfriend [name] [phone] on 11/10/2022, message left 11/11/2022 9:30 AM, called 11/12/2022 at 8:45 AM, going to answering machine, x 3, updated girlfriend 11/12/2022, 11/13/2022 bedside.  Over the phone 11/14/2022, 11/15/2022  Code Status :  DNR-I  Consults  : PCCM, neurology, nephrology, palliative care  PUD Prophylaxis :     Procedures  :     CT Chest - 1. Chronic elevation of right diaphragm. 2. Small right greater than left pleural effusions. 3. Consolidation with air bronchograms in the right lower lobe, slightly progressive compared to the lung bases on prior CT. Heterogeneous consolidation and ground-glass density in the right upper lobe, findings are suspicious for pneumonia. 4. Aortic atherosclerosis. Aortic Atherosclerosis (ICD10-I70.0).  CT head and C Spine - 1. No acute intracranial abnormality. 2. No acute fracture or traumatic malalignment of the cervical spine. 3. Clustered  nodularity in the right upper lobe is worrisome for an underlying infectious or inflammatory process. There is a small right-sided pleural effusion. Recommend further evaluation with a dedicated chest CT for more definitive characterization.  Disposition Plan  :    Status is: Inpatient  DVT Prophylaxis  :    apixaban (ELIQUIS) tablet 2.5 mg Start: 2022-11-18 2200 apixaban (ELIQUIS) tablet 2.5 mg     Lab Results  Component Value Date   PLT 83 (L) 11/15/2022    Diet :  Diet Order             DIET DYS 3 Room service appropriate? No; Fluid consistency: Thin  Diet effective now                    Inpatient Medications  Scheduled Meds:  (feeding supplement) PROSource Plus  30 mL Oral BID BM   apixaban  2.5 mg Oral BID   carbidopa-levodopa  2 tablet Oral TID   cyanocobalamin  1,000 mcg Subcutaneous Daily   [START ON 11/20/2022] vitamin B-12  1,000 mcg Oral Daily   feeding supplement  237 mL Oral BID BM   free water  200 mL Per Tube Q8H   ipratropium-albuterol  3 mL Nebulization BID   lactose free nutrition  237 mL Oral TID WC   Continuous Infusions:  ampicillin-sulbactam (UNASYN) IV 3 g (11/14/22 2047)   dextrose 110 mL/hr at 11/15/22 0619   PRN Meds:.acetaminophen **OR** acetaminophen, ipratropium-albuterol, ondansetron **OR** ondansetron (ZOFRAN) IV    Objective:   Vitals:   11/15/22 0031 11/15/22 0623 11/15/22 0800 11/15/22 0902  BP:   (!) 149/79   Pulse: 84  90   Resp: (!) 23  17   Temp: (!) 97.1 F (36.2 C)     TempSrc: Axillary     SpO2: 94% 97%  99%  Weight:      Height:        Wt Readings from Last 3 Encounters:  11-18-22 87.5 kg  07/08/22 87.5 kg  06/22/22 89.4 kg     Intake/Output Summary (Last 24 hours) at 11/15/2022 1002 Last data filed at 11/15/2022 0200 Gross per 24 hour  Intake --  Output 1300 ml  Net -1300 ml     Physical Exam  Awake, minimally awake, following some basic commands, on nasal cannula oxygen this  morning, [place].AT,PERRAL Supple Neck, No JVD,   Symmetrical Chest wall movement, Good air movement bilaterally, bilateral crackles RRR,No Gallops,Rubs or new Murmurs,  +ve B.Sounds, Abd Soft, No tenderness,   2+ leg edema    Data Review:    Recent Labs  Lab 11/11/22 1135 11/12/22 0430 11/13/22 0913 11/14/22 0445 11/15/22 0405  WBC 5.0 4.4 4.3 3.3* 4.0  HGB 11.9* 11.3* 11.7* 10.3* 10.5*  HCT 40.4 38.2* 39.3 34.9* 36.4*  PLT 120* 113* 94* 81* 83*  MCV 92.4 91.4 95.2 93.3 97.1  MCH 27.2 27.0 28.3 27.5 28.0  MCHC 29.5* 29.6* 29.8* 29.5* 28.8*  RDW 18.3* 18.2* 18.1* 17.7* 17.6*  LYMPHSABS 0.6* 0.5* 0.8 0.5* 0.5*  MONOABS 0.5 0.6 0.6 0.4 0.3  EOSABS 0.1 0.1 0.2 0.2 0.2  BASOSABS 0.1 0.0 0.0 0.0 0.0    Recent Labs  Lab 11/18/2022 1300 11-18-2022 1311 18-Nov-2022 1316 Nov 18, 2022 1545 11/10/22 0045 11/10/22 1208 11/10/22 1636 11/11/22 1135 11/12/22 0430 11/13/22 0913 11/14/22 0445 11/15/22 0405  NA 142  --    < >  --    < >  --    < > 150* 151* 147* 145 148*  K 4.6  --    < >  --    < >  --    < > 3.1* 3.4* 3.9 3.6 3.9  CL  97*  --    < >  --    < >  --    < > 100 102 103 102 103  CO2 31  --   --   --    < >  --    < > 35* 37* 36* 36* 35*  ANIONGAP 14  --   --   --    < >  --    < > 15 12 8 7 10   GLUCOSE 80  --    < >  --    < >  --    < > 72 107* 94 112* 94  BUN 54*  --    < >  --    < >  --    < > 47* 45* 41* 38* 41*  CREATININE 2.52*  --    < >  --    < >  --    < > 2.49* 2.48* 2.18* 1.98* 1.97*  AST 25  --   --   --    < >  --   --  15 15 14* 38 83*  ALT 7  --   --   --    < >  --   --  13 5 6 6 6   ALKPHOS 144*  --   --   --    < >  --   --  147* 127* 121 89 116  BILITOT 1.7*  --   --   --    < >  --   --  2.5* 2.4* 2.4* 2.7* 3.1*  ALBUMIN 3.3*  --   --   --    < >  --   --  3.1* 2.8* 2.7* 3.4* 4.2  CRP  --   --   --   --    < >  --   --  0.9 1.3* 1.7* 1.8* 2.5*  PROCALCITON  --   --    < >  --    < >  --   --  0.16 0.16 0.18 0.14 0.17  LATICACIDVEN  --  1.2  --  1.4  --    --   --   --   --   --   --   --   INR 1.5*  --   --   --   --   --   --   --   --   --   --   --   TSH  --   --   --   --   --   --   --   --   --  6.222*  --   --   AMMONIA  --   --   --   --   --  32  --   --   --   --   --   --   BNP  --   --   --   --    < >  --   --  484.6* 679.0* 426.5* 542.5* 656.8*  MG  --   --   --   --   --   --   --  2.4 2.4 2.5* 2.3 2.6*  CALCIUM 9.5  --   --   --    < >  --    < > 9.3 9.3 8.8* 9.0 9.3   < > = values in this  interval not displayed.      Recent Labs  Lab December 04, 2022 1300 2022/12/04 1311 12/04/2022 1528 2022/12/04 1545 11/10/22 0045 11/10/22 1208 11/10/22 1636 11/11/22 1135 11/12/22 0430 11/13/22 0913 11/14/22 0445 11/15/22 0405  CRP  --   --   --   --    < >  --   --  0.9 1.3* 1.7* 1.8* 2.5*  PROCALCITON  --   --    < >  --    < >  --   --  0.16 0.16 0.18 0.14 0.17  LATICACIDVEN  --  1.2  --  1.4  --   --   --   --   --   --   --   --   INR 1.5*  --   --   --   --   --   --   --   --   --   --   --   TSH  --   --   --   --   --   --   --   --   --  6.222*  --   --   AMMONIA  --   --   --   --   --  32  --   --   --   --   --   --   BNP  --   --   --   --    < >  --   --  484.6* 679.0* 426.5* 542.5* 656.8*  MG  --   --   --   --   --   --   --  2.4 2.4 2.5* 2.3 2.6*  CALCIUM 9.5  --   --   --    < >  --    < > 9.3 9.3 8.8* 9.0 9.3   < > = values in this interval not displayed.   Lab Results  Component Value Date   TSH 6.222 (H) 11/13/2022     Micro Results Recent Results (from the past 240 hour(s))  Resp panel by RT-PCR (RSV, Flu A&B, Covid) Anterior Nasal Swab     Status: None   Collection Time: Dec 04, 2022 12:44 PM   Specimen: Anterior Nasal Swab  Result Value Ref Range Status   SARS Coronavirus 2 by RT PCR NEGATIVE NEGATIVE Final   Influenza A by PCR NEGATIVE NEGATIVE Final   Influenza B by PCR NEGATIVE NEGATIVE Final    Comment: (NOTE) The Xpert Xpress SARS-CoV-2/FLU/RSV plus assay is intended as an aid in the diagnosis of  influenza from Nasopharyngeal swab specimens and should not be used as a sole basis for treatment. Nasal washings and aspirates are unacceptable for Xpert Xpress SARS-CoV-2/FLU/RSV testing.  Fact Sheet for Patients: BloggerCourse.com  Fact Sheet for Healthcare Providers: SeriousBroker.it  This test is not yet approved or cleared by the [place] FDA and has been authorized for detection and/or diagnosis of SARS-CoV-2 by FDA under an Emergency Use Authorization (EUA). This EUA will remain in effect (meaning this test can be used) for the duration of the COVID-19 declaration under Section 564(b)(1) of the Act, 21 U.S.C. section 360bbb-3(b)(1), unless the authorization is terminated or revoked.     Resp Syncytial Virus by PCR NEGATIVE NEGATIVE Final    Comment: (NOTE) Fact Sheet for Patients: BloggerCourse.com  Fact Sheet for Healthcare Providers: SeriousBroker.it  This test is not yet approved or cleared by the [place] FDA and has  been authorized for detection and/or diagnosis of SARS-CoV-2 by FDA under an Emergency Use Authorization (EUA). This EUA will remain in effect (meaning this test can be used) for the duration of the COVID-19 declaration under Section 564(b)(1) of the Act, 21 U.S.C. section 360bbb-3(b)(1), unless the authorization is terminated or revoked.  Performed at [facility], [address], [place], [place] [address]   Blood Culture (routine x 2)     Status: None   Collection Time: 2022/11/22  1:00 PM   Specimen: BLOOD RIGHT FOREARM  Result Value Ref Range Status   Specimen Description BLOOD RIGHT FOREARM  Final   Special Requests   Final    BOTTLES DRAWN AEROBIC AND ANAEROBIC Blood Culture results may not be optimal due to an excessive volume of blood received in culture bottles   Culture   Final    NO GROWTH 5 DAYS Performed at [facility], [address] [place], [place] [address]    Report Status 11/14/2022 FINAL  Final  Blood Culture (routine x 2)     Status: None   Collection Time: 11-22-2022  1:17 PM   Specimen: BLOOD  Result Value Ref Range Status   Specimen Description BLOOD LEFT ANTECUBITAL  Final   Special Requests   Final    BOTTLES DRAWN AEROBIC AND ANAEROBIC Blood Culture results may not be optimal due to an excessive volume of blood received in culture bottles   Culture   Final    NO GROWTH 5 DAYS Performed at [facility], [address], [place], [place] [address]    Report Status 11/14/2022 FINAL  Final    Radiology Reports DG Chest Port 1 View  Result Date: 11/15/2022 CLINICAL DATA:  Shortness of breath. EXAM: PORTABLE CHEST 1 VIEW COMPARISON:  Chest x-ray dated November 12, 2022. FINDINGS: Unchanged cardiomegaly. Increasing interstitial and hazy airspace opacities in both lungs. Unchanged elevation of the right hemidiaphragm with small right pleural effusion and right basilar atelectasis. No pneumothorax. No acute osseous abnormality. IMPRESSION: 1. Worsening pulmonary edema. 2. Unchanged small right pleural effusion. Electronically Signed   By: Obie Dredge M.D.   On: 11/15/2022 08:03   DG Chest Port 1 View  Result Date: 11/12/2022 CLINICAL DATA:  Shortness of breath. EXAM: PORTABLE CHEST 1 VIEW COMPARISON:  11/11/2022 FINDINGS: Cardiac enlargement is stable. Aortic atherosclerotic calcifications. Asymmetric elevation of the right hemidiaphragm is unchanged. Persistent right pleural effusion with veil like opacification over the right midlung. Right lower lobe atelectasis and consolidation and patchy airspace densities in the right upper lobe are unchanged. Stable mild subsegmental atelectasis in the left base. IMPRESSION: 1. No significant interval change. 2. Persistent right pleural effusion with veil like opacification over the right midlung. 3. Right lower lobe atelectasis and  consolidation and patchy airspace densities in the right upper lobe are unchanged. Electronically Signed   By: Signa Kell M.D.   On: 11/12/2022 08:14      Signature  -   Susa Raring M.D on 11/15/2022 at 10:02 AM   -  To page go to [email or web address]

## 2022-11-15 NOTE — Plan of Care (Signed)
Patient not alert and wheezing today. Patient to get cortrac and start feeds.

## 2022-11-15 NOTE — Progress Notes (Signed)
PT Cancellation Note  Patient Details Name: [name] MRN: [id number] DOB: Jun 14, 1935   Cancelled Treatment:    Reason Eval/Treat Not Completed: (P) Medical issues which prohibited therapy (RN defer due to pt with increased difficulty breathing today.) Will continue efforts next date per PT plan of care as schedule permits if medically appropriate.   Dorathy Kinsman Eulanda Dorion 11/15/2022, 12:53 PM

## 2022-11-15 NOTE — Progress Notes (Addendum)
Subjective: Continues to be confused  Exam: Vitals:   11/15/22 0902 11/15/22 1000  BP:  (!) 147/83  Pulse:  85  Resp:  20  Temp:  (!) 97.1 F (36.2 C)  SpO2: 99% 97%   Gen: In bed, NAD Resp: Appears slightly short of breath Abd: soft, nt  Neuro: MS: Opens eyes to noxious stimulation, he does fixate and track me once stimulated, he does wiggle his right thumb to command a single time, but does not follow any other commands, he does mumble something in response to a question but I am one able to understand what he mumbled. CN: Pupils are reactive bilaterally, he has a disconjugate gaze when I initially started assessing him, but his eyes did conjugate with increasing arousal, face is symmetric Motor: He has mildly increased tone throughout, he moves all extremities spontaneously Sensory: Response to noxious stimulation in all four extremities   Pertinent Labs: Creatinine 1.97, improved from 2.5 earlier in admission appears baseline is around 1.7 CK on admission was 82 UA on arrival was negative, procalcitonin was negative initially though it did go up to 0.16 B1 103 B12 259 TSH 6.22  Impression: 87 year old male with acute encephalopathy and following starting Nuplazid.  This is an unusual complication of this medication, but it is described as increasing hallucinations in a small percentage of patients.  The description of increasing hallucinations immediately following the drug followed by decreasing levels of arousal is suggestive that this played a role in his initial presentation.  At this point, I think it may be a multifactorial delirium including possible aspiration, hypoxemia, prolonged hospitalization.  Certainly with the long half-life of active metabolites of Nuplazid, this could still be playing a significant role as well.  I agree with continued supportive care to give him time to see if he is going to improve, and the current plan is to place an NG tube for supplemental  enteric feeding pending improvement in his mental status.  Family is leaning towards comfort care if he does not improve.  Recommendations: 1) agree with continued supportive care for the time being 2) agree with continued B12 supplementation 3) check free T4, consider treating if low 4) continue home Sinemet dosing 5) neurology will continue to be available as needed.   Ritta Slot, MD [facility] [phone]  If 7pm- 7am, please page neurology on call as listed in AMION.

## 2022-11-15 NOTE — Progress Notes (Signed)
Pt's Sp02 in the low 80's per RN on 5L [place].  Pt placed on 10L HFNC (salter). Sp02 increased to 98%.  MD paged, VVG ordered and no Bipap at this time per MD

## 2022-11-15 NOTE — Progress Notes (Signed)
Pt has PRN BIPAP orders. Pt on 4LNC, no distress noted at this time.

## 2022-11-15 NOTE — Progress Notes (Signed)
Dr. Thedore Mins stated to hold off on ng tube for now. Cortrac to be placed tomorrow.

## 2022-11-16 ENCOUNTER — Inpatient Hospital Stay (HOSPITAL_COMMUNITY): Payer: Medicare Other

## 2022-11-16 DIAGNOSIS — R4182 Altered mental status, unspecified: Secondary | ICD-10-CM | POA: Diagnosis not present

## 2022-11-16 DIAGNOSIS — D696 Thrombocytopenia, unspecified: Secondary | ICD-10-CM | POA: Insufficient documentation

## 2022-11-16 DIAGNOSIS — I509 Heart failure, unspecified: Secondary | ICD-10-CM

## 2022-11-16 DIAGNOSIS — Z515 Encounter for palliative care: Secondary | ICD-10-CM | POA: Diagnosis not present

## 2022-11-16 DIAGNOSIS — Z7189 Other specified counseling: Secondary | ICD-10-CM | POA: Diagnosis not present

## 2022-11-16 DIAGNOSIS — J811 Chronic pulmonary edema: Secondary | ICD-10-CM | POA: Insufficient documentation

## 2022-11-16 DIAGNOSIS — E870 Hyperosmolality and hypernatremia: Secondary | ICD-10-CM | POA: Insufficient documentation

## 2022-11-16 LAB — BLOOD GAS, VENOUS
Acid-Base Excess: 17.1 mmol/L — ABNORMAL HIGH (ref 0.0–2.0)
Bicarbonate: 45.4 mmol/L — ABNORMAL HIGH (ref 20.0–28.0)
O2 Saturation: 97.4 %
Patient temperature: 37.4
pCO2, Ven: 71 mmHg (ref 44–60)
pH, Ven: 7.41 (ref 7.25–7.43)
pO2, Ven: 86 mmHg — ABNORMAL HIGH (ref 32–45)

## 2022-11-16 MED ORDER — HYDROMORPHONE BOLUS VIA INFUSION: 0.5000 mg | INTRAVENOUS | Status: DC | PRN

## 2022-11-16 MED ORDER — POLYVINYL ALCOHOL 1.4 % OP SOLN: 1.0000 [drp] | Freq: Four times a day (QID) | OPHTHALMIC | Status: DC | PRN

## 2022-11-16 MED ORDER — LORAZEPAM 2 MG/ML IJ SOLN: 0.5000 mg | INTRAMUSCULAR | Status: DC | PRN

## 2022-11-16 MED ORDER — BIOTENE DRY MOUTH MT LIQD: 15.0000 mL | OROMUCOSAL | Status: DC | PRN

## 2022-11-16 MED ORDER — HYDROMORPHONE HCL-NACL 50-0.9 MG/50ML-% IV SOLN
1.0000 mg/h | INTRAVENOUS | Status: DC
  Administered 2022-11-16: 1 mg/h via INTRAVENOUS
  Filled 2022-11-16 (×2): qty 50

## 2022-11-16 MED ORDER — GLYCOPYRROLATE 0.2 MG/ML IJ SOLN
0.4000 mg | INTRAMUSCULAR | Status: DC
  Administered 2022-11-16: 0.4 mg via INTRAVENOUS
  Filled 2022-11-16: qty 2

## 2022-11-16 MED ORDER — FUROSEMIDE 10 MG/ML IJ SOLN
40.0000 mg | Freq: Once | INTRAMUSCULAR | Status: AC
  Administered 2022-11-16: 40 mg via INTRAVENOUS
  Filled 2022-11-16: qty 4

## 2022-11-17 ENCOUNTER — Other Ambulatory Visit: Payer: Self-pay | Admitting: Neurology

## 2022-11-17 DIAGNOSIS — G20A1 Parkinson's disease without dyskinesia, without mention of fluctuations: Secondary | ICD-10-CM

## 2022-11-29 NOTE — Progress Notes (Signed)
Pt placed on Bipap per VVG results. MD at bedside. Pt tolerating well.        11/13/2022 0025  BiPAP/CPAP/SIPAP  $ Non-Invasive Ventilator  Non-Invasive Vent Initial  BiPAP/CPAP/SIPAP Pt Type Adult  BiPAP/CPAP/SIPAP V60  Mask Type Full face mask  Mask Size Large  Set Rate 16 breaths/min  Respiratory Rate 20 breaths/min  IPAP 16 cmH20  EPAP 6 cmH2O  FiO2 (%) 30 %  Minute Ventilation 83.7  Leak 60  Peak Inspiratory Pressure (PIP) 17  Tidal Volume (Vt) 420  Patient Home Equipment No  Auto Titrate No  Press High Alarm 25 cmH2O  Press Low Alarm 5 cmH2O  BiPAP/CPAP /SiPAP Vitals  Bilateral Breath Sounds Diminished

## 2022-11-29 NOTE — Progress Notes (Signed)
RT called to assess patient for need of nasal tracheal suction. Patient in no distress, lung sounds are clear without sounds of rhonchi/secretions. Patients vitals stable. Sats 97% on 6L. RN aware, in room. RT will continue to monitor as needed.

## 2022-11-29 NOTE — Progress Notes (Signed)
PROGRESS NOTE                                                                                                                                                                                                             Patient Demographics:    [name], is a 87 y.o. male, DOB - [birth year], YSA:[id number]  Outpatient Primary MD for the patient is Merri Brunette, MD    LOS - 7  Admit date - 11/08/2022    Chief Complaint  Patient presents with   Failure To Thrive       Brief Narrative (HPI from H&P)     87 year old male with history of Parkinson's disease, pulmonary hypertension, dilated cardiomyopathy, CAD, CKD 3B baseline creatinine around 1.6 who was recently started on nuplacid for his Parkinson's about 10 days ago, he took 6 doses of that medication last being on 11/07/2022.  Per caregiver since the start of this medication he has been getting increasingly weak and confused, he is usually wheelchair-bound and cannot walk a little bit but became increasingly more lethargic and short of breath.    Brought to the ER where he was found to have hypoxic respiratory failure, lethargy, CT chest with questionable pneumonia, CT head nonacute, initially required nasal cannula oxygen however overnight became more hypoxic requiring BiPAP with 100% FiO2, transferred to my care on 11/10/2022.  Currently he is obtunded, on BiPAP with 100% FiO2.  Exam reveals 3+ leg edema and crackles, stable WBC, negative procalcitonin and ABG suggests severe hypoxic respiratory failure.   Subjective:   Patient in bed, lethargic, obtundent, he was put on BiPAP yesterday secondary to significant retention with pCO2 of 102, this morning he had copious secretions, where he appeared to be aspirating on oral secretions after he was given a break from BiPAP mask .   Assessment  & Plan :   Acute hypoxic respiratory failure, hypoxemic,  hypercapnic. Multifactorial in the setting of aspiration, significant volume overload Acute on chronic failure with a preserved EF  Aspiration pneumonia -Patient with evidence of massive volume overload. -Decompensated overnight, pCO2 at 102, requiring BiPAP -Imaging with significant for volume overload, but no improvement with IV diuresis. -Patient with significant aspiration, especially with BiPAP -Continue with IV diuresis as tolerated. -Patient is DNR and IR, palliative medicine is following, likely will be transitioned to full comfort  measures as overall prognosis appears poor, mentation significantly altered, unable to protect airways, wearing BiPAP for hypercapnia, but in the same time unable to protect his airways with significant secretions puts him at higher risk for aspiration -Treated with antibiotics   Worsening encephalopathy. The factorial, due to hypercapnia, Parkinson disease, hypernatremia, and  recent exposure to Nuplacid  - CT and EEG unremarkable, no focal deficits, stable TSH, stable B1, B12 was low normal and is being replaced, holding the potential Nuplazid, oxygenation has improved, seen by neurology, requested to see again on 11/15/2022.  Unfortunately  Nuplacid has a very long half-life and the active metabolites can stay in the body for up to a month according to the pharmacy.  Girlfriend is very clear that although patient did not have a very good quality of life but his decline was very rapid and immediately after the first dose of this medication. -Unfortunately patient with rapid decompensation in respiratory status, mental status, palliative reengaged today to establish goals of care and comfort.  Low normal vitamin B12 levels.  Replaced.    HX of Parkinson's.  Currently continuing home medication Sinemet, PT OT and speech eval, seen by neurology this admission.  Offending medication Nuplazid held.  AKI on CKD 3B with hypernatremia.  Baseline creatinine 1.6, AKI  in the setting of massive fluid overload, however due to poor oral intake and protein calorie malnutrition most of the fluid is third spaced and he is intravascularly getting dehydrated, oral protein supplementation, poor candidate for NG tube or PEG tube, diuretics as tolerated, nephrology on board.  Encouraged free water intake.  Improvement with albumin and Lasix continue on 11/14/2022.  Severe PCM.  Protein supplements.    Paroxysmal atrial fibrillation [place] vas 2 score greater than 3.  On Eliquis, continue as tolerated, low-dose beta-blocker scheduled oral and IV and as needed.  Massive lower extremity edema.  Diurese with Lasix albumin as tolerated, lower extremity edema significantly improved.  Nonspecific CT cervical spine changes-CT cervical spine showed grade 1 anterolisthesis of C3 on C4 and C4 and C5, trace retrolisthesis of C5 on C6. [name]  Outpatient neurosurgery follow-up.  No neck pain.       Condition - Extremely Guarded  Family Communication  : Discussed with girlfriend at bedside  Code Status :  DNR-I  Consults  : PCCM, neurology, nephrology, palliative care  PUD Prophylaxis :     Procedures  :     CT Chest - 1. Chronic elevation of right diaphragm. 2. Small right greater than left pleural effusions. 3. Consolidation with air bronchograms in the right lower lobe, slightly progressive compared to the lung bases on prior CT. Heterogeneous consolidation and ground-glass density in the right upper lobe, findings are suspicious for pneumonia. 4. Aortic atherosclerosis. Aortic Atherosclerosis (ICD10-I70.0).  CT head and C Spine - 1. No acute intracranial abnormality. 2. No acute fracture or traumatic malalignment of the cervical spine. 3. Clustered nodularity in the right upper lobe is worrisome for an underlying infectious or inflammatory process. There is a small right-sided pleural effusion. Recommend further evaluation with a dedicated chest CT for more definitive  characterization.      Disposition Plan  :    Status is: Inpatient  DVT Prophylaxis  :    apixaban (ELIQUIS) tablet 2.5 mg Start: 11/26/2022 2200 apixaban (ELIQUIS) tablet 2.5 mg     Lab Results  Component Value Date   PLT 90 (L) 12-05-22    Diet :  Diet Order  DIET DYS 3 Room service appropriate? No; Fluid consistency: Thin  Diet effective now                    Inpatient Medications  Scheduled Meds:  (feeding supplement) PROSource Plus  30 mL Oral BID BM   apixaban  2.5 mg Oral BID   carbidopa-levodopa  2 tablet Oral TID   cyanocobalamin  1,000 mcg Subcutaneous Daily   [START ON 11/20/2022] vitamin B-12  1,000 mcg Oral Daily   feeding supplement  237 mL Oral BID BM   free water  250 mL Per Tube Q6H   ipratropium-albuterol  3 mL Nebulization BID   lactose free nutrition  237 mL Oral TID WC   Continuous Infusions:   PRN Meds:.acetaminophen **OR** acetaminophen, hydrALAZINE, ipratropium-albuterol, ondansetron **OR** ondansetron (ZOFRAN) IV    Objective:   Vitals:   12/09/22 0400 09-Dec-2022 0558 2022-12-09 0800 Dec 09, 2022 0802  BP: (!) 131/59   139/68  Pulse: 77 83  80  Resp: (!) 21 (!) 22  (!) 21  Temp:  99.3 F (37.4 C)  99.3 F (37.4 C)  TempSrc:  Oral  Oral  SpO2: 93% 94% 94% 94%  Weight:      Height:        Wt Readings from Last 3 Encounters:  11/10/2022 87.5 kg  07/08/22 87.5 kg  06/22/22 89.4 kg     Intake/Output Summary (Last 24 hours) at Dec 09, 2022 1159 Last data filed at 09-Dec-2022 0559 Gross per 24 hour  Intake 0 ml  Output 2750 ml  Net -2750 ml     Physical Exam   Obtundent, very poorly responsive, does not communicate or provide any complaints Diminished air entry bilaterally, with crackles at the bases RRR,No Gallops,Rubs or new Murmurs, No Parasternal Heave +ve B.Sounds, Abd Soft, No tenderness, No rebound - guarding or rigidity. No Cyanosis, Clubbing ,+1 edema, No new Rash or bruise       Data Review:     Recent Labs  Lab 11/12/22 0430 11/13/22 0913 11/14/22 0445 11/15/22 0405 12/09/22 0358  WBC 4.4 4.3 3.3* 4.0 4.2  HGB 11.3* 11.7* 10.3* 10.5* 10.3*  HCT 38.2* 39.3 34.9* 36.4* 35.0*  PLT 113* 94* 81* 83* 90*  MCV 91.4 95.2 93.3 97.1 97.2  MCH 27.0 28.3 27.5 28.0 28.6  MCHC 29.6* 29.8* 29.5* 28.8* 29.4*  RDW 18.2* 18.1* 17.7* 17.6* 17.0*  LYMPHSABS 0.5* 0.8 0.5* 0.5* 0.3*  MONOABS 0.6 0.6 0.4 0.3 0.3  EOSABS 0.1 0.2 0.2 0.2 0.1  BASOSABS 0.0 0.0 0.0 0.0 0.0    Recent Labs  Lab 10/30/2022 1300 11/23/2022 1311 11/26/2022 1316 11/05/2022 1545 11/10/22 0045 11/10/22 1208 11/10/22 1636 11/12/22 0430 11/13/22 0913 11/14/22 0445 11/15/22 0405 12-09-22 0358  NA 142  --    < >  --    < >  --    < > 151* 147* 145 148* 150*  K 4.6  --    < >  --    < >  --    < > 3.4* 3.9 3.6 3.9 3.5  CL 97*  --    < >  --    < >  --    < > 102 103 102 103 104  CO2 31  --   --   --    < >  --    < > 37* 36* 36* 35* 37*  ANIONGAP 14  --   --   --    < >  --    < >  12 8 7 10 9   GLUCOSE 80  --    < >  --    < >  --    < > 107* 94 112* 94 114*  BUN 54*  --    < >  --    < >  --    < > 45* 41* 38* 41* 46*  CREATININE 2.52*  --    < >  --    < >  --    < > 2.48* 2.18* 1.98* 1.97* 1.94*  AST 25  --   --   --    < >  --    < > 15 14* 38 83* 33  ALT 7  --   --   --    < >  --    < > 5 6 6 6 11   ALKPHOS 144*  --   --   --    < >  --    < > 127* 121 89 116 109  BILITOT 1.7*  --   --   --    < >  --    < > 2.4* 2.4* 2.7* 3.1* 2.9*  ALBUMIN 3.3*  --   --   --    < >  --    < > 2.8* 2.7* 3.4* 4.2 3.8  CRP  --   --   --   --    < >  --    < > 1.3* 1.7* 1.8* 2.5* 4.9*  PROCALCITON  --   --    < >  --    < >  --    < > 0.16 0.18 0.14 0.17 0.23  LATICACIDVEN  --  1.2  --  1.4  --   --   --   --   --   --   --   --   INR 1.5*  --   --   --   --   --   --   --   --   --   --   --   TSH  --   --   --   --   --   --   --   --  6.222*  --   --   --   AMMONIA  --   --   --   --   --  32  --   --   --   --   --   --    BNP  --   --   --   --    < >  --    < > 679.0* 426.5* 542.5* 656.8* 769.7*  MG  --   --   --   --   --   --    < > 2.4 2.5* 2.3 2.6* 2.5*  CALCIUM 9.5  --   --   --    < >  --    < > 9.3 8.8* 9.0 9.3 9.0   < > = values in this interval not displayed.      Recent Labs  Lab 11/20/2022 1300 11/27/2022 1311 11/25/2022 1528 11/10/2022 1545 11/10/22 0045 11/10/22 1208 11/10/22 1636 11/12/22 0430 11/13/22 0913 11/14/22 0445 11/15/22 0405 12/12/22 0358  CRP  --   --   --   --    < >  --    < > 1.3* 1.7* 1.8* 2.5* 4.9*  PROCALCITON  --   --    < >  --    < >  --    < >  0.16 0.18 0.14 0.17 0.23  LATICACIDVEN  --  1.2  --  1.4  --   --   --   --   --   --   --   --   INR 1.5*  --   --   --   --   --   --   --   --   --   --   --   TSH  --   --   --   --   --   --   --   --  6.222*  --   --   --   AMMONIA  --   --   --   --   --  32  --   --   --   --   --   --   BNP  --   --   --   --    < >  --    < > 679.0* 426.5* 542.5* 656.8* 769.7*  MG  --   --   --   --   --   --    < > 2.4 2.5* 2.3 2.6* 2.5*  CALCIUM 9.5  --   --   --    < >  --    < > 9.3 8.8* 9.0 9.3 9.0   < > = values in this interval not displayed.   Lab Results  Component Value Date   TSH 6.222 (H) 11/13/2022     Micro Results Recent Results (from the past 240 hour(s))  Resp panel by RT-PCR (RSV, Flu A&B, Covid) Anterior Nasal Swab     Status: None   Collection Time: 11/19/2022 12:44 PM   Specimen: Anterior Nasal Swab  Result Value Ref Range Status   SARS Coronavirus 2 by RT PCR NEGATIVE NEGATIVE Final   Influenza A by PCR NEGATIVE NEGATIVE Final   Influenza B by PCR NEGATIVE NEGATIVE Final    Comment: (NOTE) The Xpert Xpress SARS-CoV-2/FLU/RSV plus assay is intended as an aid in the diagnosis of influenza from Nasopharyngeal swab specimens and should not be used as a sole basis for treatment. Nasal washings and aspirates are unacceptable for Xpert Xpress SARS-CoV-2/FLU/RSV testing.  Fact Sheet for  Patients: BloggerCourse.com  Fact Sheet for Healthcare Providers: SeriousBroker.it  This test is not yet approved or cleared by the [place] FDA and has been authorized for detection and/or diagnosis of SARS-CoV-2 by FDA under an Emergency Use Authorization (EUA). This EUA will remain in effect (meaning this test can be used) for the duration of the COVID-19 declaration under Section 564(b)(1) of the Act, 21 U.S.C. section 360bbb-3(b)(1), unless the authorization is terminated or revoked.     Resp Syncytial Virus by PCR NEGATIVE NEGATIVE Final    Comment: (NOTE) Fact Sheet for Patients: BloggerCourse.com  Fact Sheet for Healthcare Providers: SeriousBroker.it  This test is not yet approved or cleared by the [place] FDA and has been authorized for detection and/or diagnosis of SARS-CoV-2 by FDA under an Emergency Use Authorization (EUA). This EUA will remain in effect (meaning this test can be used) for the duration of the COVID-19 declaration under Section 564(b)(1) of the Act, 21 U.S.C. section 360bbb-3(b)(1), unless the authorization is terminated or revoked.  Performed at [facility], [address], [place], [place] [address]   Blood Culture (routine x 2)     Status: None   Collection Time: 11/10/2022  1:00 PM  Specimen: BLOOD RIGHT FOREARM  Result Value Ref Range Status   Specimen Description BLOOD RIGHT FOREARM  Final   Special Requests   Final    BOTTLES DRAWN AEROBIC AND ANAEROBIC Blood Culture results may not be optimal due to an excessive volume of blood received in culture bottles   Culture   Final    NO GROWTH 5 DAYS Performed at [facility], [address] [place], [place] [address]    Report Status 11/14/2022 FINAL  Final  Blood Culture (routine x 2)     Status: None   Collection Time: 10/30/2022  1:17 PM   Specimen: BLOOD  Result Value  Ref Range Status   Specimen Description BLOOD LEFT ANTECUBITAL  Final   Special Requests   Final    BOTTLES DRAWN AEROBIC AND ANAEROBIC Blood Culture results may not be optimal due to an excessive volume of blood received in culture bottles   Culture   Final    NO GROWTH 5 DAYS Performed at [facility], [address], [place], [place] [address]    Report Status 11/14/2022 FINAL  Final    Radiology Reports DG CHEST PORT 1 VIEW  Result Date: 11/15/2022 CLINICAL DATA:  Hypoxia, respiratory distress EXAM: PORTABLE CHEST 1 VIEW COMPARISON:  11/15/2022 FINDINGS: Single frontal view of the chest demonstrates stable enlargement of the cardiac silhouette. There is bilateral airspace disease, right greater than left. Stable right pleural effusion. No pneumothorax. No acute bony abnormalities. IMPRESSION: 1. Stable pulmonary edema and right pleural effusion. Electronically Signed   By: Sharlet Salina M.D.   On: 11/15/2022 23:50   DG Chest Port 1 View  Result Date: 11/15/2022 CLINICAL DATA:  Shortness of breath. EXAM: PORTABLE CHEST 1 VIEW COMPARISON:  Chest x-ray dated November 12, 2022. FINDINGS: Unchanged cardiomegaly. Increasing interstitial and hazy airspace opacities in both lungs. Unchanged elevation of the right hemidiaphragm with small right pleural effusion and right basilar atelectasis. No pneumothorax. No acute osseous abnormality. IMPRESSION: 1. Worsening pulmonary edema. 2. Unchanged small right pleural effusion. Electronically Signed   By: Obie Dredge M.D.   On: 11/15/2022 08:03      Signature  -   Mliss Fritz Nohea Kras M.D on 2022-12-11 at 11:59 AM   -  To page go to [email or web address]

## 2022-11-29 NOTE — Progress Notes (Signed)
Patient desat to 85% on 4L [place], increased to 6L and added humidity. Paged respiratory, and notified hospitalist. New orders per EMR. Sats increased to 100% on 10L. Patient later placed on bipap d/t hypercapnia on VBG. Notified primary contact, [name] of increased oxygen demands.

## 2022-11-29 NOTE — Progress Notes (Signed)
This chaplain responded to PMT NP-Michelle's consult for EOL spiritual care. The Pt. is resting comfortably with the Pt. SO-Linda, Linda's sister and friends at the bedside. The family is expecting additional visitors this evening.  The time with the Pt. and family included storytelling mixed with many memories and the grief of anticipatory loss. The chaplain understands the Pt. loves people, outdoors, humor, a good deal, and ice cream.  [name] is supported by her family and clergy.   The chaplain accepted the family's invitation for prayer. F/U spiritual care is available as needed.  Chaplain Stephanie Acre [phone]

## 2022-11-29 NOTE — Progress Notes (Signed)
Palliative Medicine Inpatient Follow Up Note HPI: 87 year old male with history of Parkinson's disease, pulmonary hypertension, dilated cardiomyopathy, CAD, CKD 3B baseline creatinine around 1.6 who was recently started on nuplacid for his Parkinson's about 10 days ago, he took 6 doses of that medication last being on 11/07/2022.    Palliative care has been asked to get involved to further assist in goals of care conversations based upon chronic co-morbid conditions and poor overall condition.   Today's Discussion 11/28/2022  *Please note that this is a verbal dictation therefore any spelling or grammatical errors are due to the "Dragon Medical One" system interpretation.  Chart reviewed inclusive of vital signs, progress notes, laboratory results, and diagnostic images.   I spoke to patients RN who shares that [name] had acute changes overnight from a respiratory perspective.  I was able to coordinate with Dr. Randol Kern to meet patients long time girlfriend, [name]. He was able to explain to her the significant of [name]'s secretions and ongoing aspiration risk. He shared the improvement in lung function with BiPap and the ongoing risk of aspiration with Bipap. It was  explained that we can keep doing these interventions though we will likely have a similar conversation in a few days from now.  Dr. Randol Kern recommended focus on comfort emphasis which [name] was in agreement with. I was able to explain in greater detail what that meant. We talked about transition to comfort measures in house and what that would entail inclusive of medications to control pain, dyspnea, agitation, nausea, itching, and hiccups.  We discussed stopping all uneccessary measures such as cardiac monitoring, blood draws, needle sticks, and frequent vital signs.   Utilized reflective listening throughout our time together given the difficulty of the conversation. Patients long time girlfriend, [name] does not want [name] to suffer.  She plans to call friends and family to start visiting.   Questions and concerns addressed/Palliative Support Provided.   Objective Assessment: Vital Signs Vitals:   11/13/2022 0800 11/24/2022 0802  BP:  139/68  Pulse:  80  Resp:  (!) 21  Temp:  99.3 F (37.4 C)  SpO2: 94% 94%    Intake/Output Summary (Last 24 hours) at 11/25/2022 1235 Last data filed at 11/17/2022 0559 Gross per 24 hour  Intake 0 ml  Output 2750 ml  Net -2750 ml   Last Weight  Most recent update: 2022/12/09 12:25 PM    Weight  87.5 kg (192 lb 14.4 oz)            Gen:  Elderly Caucasian M chronically ill in appearance HEENT: Dry mucous membranes CV: Regular rate and rhythm  PULM: On Bipap ABD: soft/nontender  EXT: (+) LE edema  Neuro: Somnolent  SUMMARY OF RECOMMENDATIONS   DNAR/DNI   Comfort Care  Initiate low dose dilaudid gtt for respiratory symptoms  Additional comfort medications per MAR  Wean to RA as tolerated   Ongoing Palliative support  Anticipate in hospital death  Billing based on MDM: High ______________________________________________________________________________________ Lamarr Lulas [place] Palliative Medicine Team Team Cell Phone: [phone] Please utilize secure chat with additional questions, if there is no response within 30 minutes please call the above phone number  Palliative Medicine Team providers are available by phone from 7am to 7pm daily and can be reached through the team cell phone.  Should this patient require assistance outside of these hours, please call the patient's attending physician.

## 2022-11-29 NOTE — Progress Notes (Signed)
03-Dec-2022 1725  Attending Physican Contact  Attending Physician Notified Y  Attending Physician (First and Last Name) Huey Bienenstock MD  Post Mortem Checklist  Date of Death 12/03/22  Time of Death 1725  Pronounced By Delicia RN & Ricard Dillon  Next of kin notified Yes (family bedside)  Name of next of kin notified of death Ulyses Southward  Contact Person's Relationship to Patient Partner  Contact Person's Phone Number [phone]  Was the patient a No Code Blue or a Limited Code Blue? Yes  Did the patient die unattended? No  Patient restrained? Not applicable  Height 5\' 10"  (1.778 m)  Weight 87.5 kg  [facility] (previously known as [facility])  Notification Date 03-Dec-2022  Notification Time 1750  [facility] Number [id number]  Is patient a potential donor? Y  Donation Type Tissue  Autopsy  Autopsy requested by MD or Family ( Non ME Case) N/A  Notifications  Patient Placement notified that Post Mortem checklist is complete Yes  Medical Examiner  Is this a medical examiner's case? [facility] home name/address/phone # [facility] [address] [place] [phone]  Planned location of pickup [place]

## 2022-11-29 NOTE — Death Summary Note (Signed)
DEATH SUMMARY   Patient Details  Name: [name] MRN: [id number] DOB: 08/27/1935 UYQ:[id number], Sonny Masters, MD Admission/Discharge Information   Admit Date:  12/09/22  Date of Death: Date of Death: Dec 16, 2022  Time of Death: Time of Death: 1725  Length of Stay: 7   Principle Cause of death:   Acute hypoxic respiratory failure, hypoxemic, hypercapnic.   Hospital Diagnoses: Principal Problem:   Acute metabolic encephalopathy Active Problems:    End-of-life care     Sepsis POA    Right upper lobe  pneumonia    Acute respiratory failure with hypoxia and hypercapnia (HCC)   Coronary artery disease involving native coronary artery of native heart without angina pectoris   AKI (acute kidney injury) (HCC)   Chronic anticoagulation   Atrial fibrillation (HCC)   Parkinson's disease   CKD (chronic kidney disease) stage 3, GFR 30-59 ml/min (HCC)   Pulmonary hypertension (HCC)   Essential hypertension   Hypokalemia   Hypervolemia   Pleural effusion due to CHF (congestive heart failure) (HCC)   Pulmonary edema   Hypernatremia   Hyperbilirubinemia   Thrombocytopenia (HCC)   Severe PCM.    Bilateral lower extremity wounds   Hospital Course: No notes on file  87 year old male with history of Parkinson's disease, pulmonary hypertension, dilated cardiomyopathy, CAD, CKD 3B baseline creatinine around 1.6 who was recently started on nuplacid for his Parkinson's about 10 days to admission, he took total of 6 doses, last being on 11/07/2022.  Per caregiver since the start of this medication he has been getting increasingly weak and confused, he is usually wheelchair-bound and cannot walk a little bit but became increasingly more lethargic and short of breath.   Brought to the ER where he was found to have hypoxic respiratory failure, lethargy, CT chest with questionable pneumonia, CT head nonacute, initially required nasal cannula oxygen, however overnight became more hypoxic requiring  BiPAP with 100% FiO2, mentation continues to worsen, where he become more obtundent, encephalopathic, and worsening respiratory distress, where he required BiPAP support, workup significant for pCO2 retention, as well he is high risk for aspiration given encephalopathy, and thick secretions, for which BiPAP has to be held intermittently, overall obtundent with very poor oral intake, significant failure to thrive, very poor baseline to start with but significantly worsened in the setting of anemia, volume overload, acute on chronic diastolic CHF, and pleural effusion, dilated medicine has been following closely, and has been transitioned to full comfort measures.  She passed away family at bedside, all their questions were answered.   Assessment and Plan:   Acute hypoxic respiratory failure, hypoxemic, hypercapnic. Multifactorial in the setting of aspiration, significant volume overload Acute on chronic failure with a preserved EF  Aspiration pneumonia Pleural effusion Acute on chronic diastolic CHF -Patient with evidence of massive volume overload.decompensated, with pCO2 of 102, requiring BiPAP, no response to IV diuresis, significant secretions, high risk for aspiration, now improved but despite receiving appropriate antibiotics .   Worsening encephalopathy. Multi factorial, due to hypercapnia, Parkinson disease, hypernatremia, and  recent exposure to Nuplacid  - CT and EEG unremarkable, stable TSH, stable B1, B12 was low normal and is being replaced, holding the potential Nuplazid, unfortunately mentation did not much improved, patient more obtunded and lethargic in the setting of multiple electrolyte derangement, long half-life of Nuplazid, and hypercapnia   Low normal vitamin B12 levels.  Replaced.     HX of Parkinson's.  Was kept on Sinemet during hospital stay, was followed by PT/OT  AKI on CKD 3B with hypernatremia.     Severe PCM.  Protein supplements.     Paroxysmal atrial  fibrillation [place] vas 2 score greater than 3.  Treated with Eliquis and low-dose beta-blocker    Massive lower extremity edema.  Diurese with Lasix albumin as tolerated, lower extremity edema significantly improved.   Nonspecific CT cervical spine changes-CT cervical spine showed grade 1 anterolisthesis of C3 on C4 and C4 and C5, trace retrolisthesis of C5 on C6. [name]  Outpatient neurosurgery follow-up.  No neck pain.   Consultations:  Palliative medicine Pulmonary critical care medicine Nephrology Neurology  The results of significant diagnostics from this hospitalization (including imaging, microbiology, ancillary and laboratory) are listed below for reference.   Significant Diagnostic Studies: DG Abd Portable 1V  Result Date: 2022/12/15 CLINICAL DATA:  [id number] Encounter for feeding tube placement [id number] EXAM: PORTABLE ABDOMEN - 1 VIEW COMPARISON:  None Available. FINDINGS: Nonobstructive bowel gas pattern. Surgical clips in the right upper quadrant. Weighted enteric tube terminates at the D3/D4 junction. IMPRESSION: Weighted enteric tube terminates at the D3/D4 junction. Electronically Signed   By: Lorenza Cambridge M.D.   On: 12/15/2022 12:20   DG CHEST PORT 1 VIEW  Result Date: 11/15/2022 CLINICAL DATA:  Hypoxia, respiratory distress EXAM: PORTABLE CHEST 1 VIEW COMPARISON:  11/15/2022 FINDINGS: Single frontal view of the chest demonstrates stable enlargement of the cardiac silhouette. There is bilateral airspace disease, right greater than left. Stable right pleural effusion. No pneumothorax. No acute bony abnormalities. IMPRESSION: 1. Stable pulmonary edema and right pleural effusion. Electronically Signed   By: Sharlet Salina M.D.   On: 11/15/2022 23:50   DG Chest Port 1 View  Result Date: 11/15/2022 CLINICAL DATA:  Shortness of breath. EXAM: PORTABLE CHEST 1 VIEW COMPARISON:  Chest x-ray dated November 12, 2022. FINDINGS: Unchanged cardiomegaly. Increasing interstitial and hazy airspace  opacities in both lungs. Unchanged elevation of the right hemidiaphragm with small right pleural effusion and right basilar atelectasis. No pneumothorax. No acute osseous abnormality. IMPRESSION: 1. Worsening pulmonary edema. 2. Unchanged small right pleural effusion. Electronically Signed   By: Obie Dredge M.D.   On: 11/15/2022 08:03   DG Chest Port 1 View  Result Date: 11/12/2022 CLINICAL DATA:  Shortness of breath. EXAM: PORTABLE CHEST 1 VIEW COMPARISON:  11/11/2022 FINDINGS: Cardiac enlargement is stable. Aortic atherosclerotic calcifications. Asymmetric elevation of the right hemidiaphragm is unchanged. Persistent right pleural effusion with veil like opacification over the right midlung. Right lower lobe atelectasis and consolidation and patchy airspace densities in the right upper lobe are unchanged. Stable mild subsegmental atelectasis in the left base. IMPRESSION: 1. No significant interval change. 2. Persistent right pleural effusion with veil like opacification over the right midlung. 3. Right lower lobe atelectasis and consolidation and patchy airspace densities in the right upper lobe are unchanged. Electronically Signed   By: Signa Kell M.D.   On: 11/12/2022 08:14   DG Chest Port 1 View  Result Date: 11/11/2022 CLINICAL DATA:  Shortness of breath and altered mental status. EXAM: PORTABLE CHEST 1 VIEW COMPARISON:  11/10/2022 FINDINGS: Asymmetric elevation right hemidiaphragm with persistent right base collapse/consolidation. Vascular congestion with diffuse interstitial prominence. The cardio pericardial silhouette is enlarged. Bones are diffusely demineralized. Telemetry leads overlie the chest. IMPRESSION: 1. Persistent right base collapse/consolidation. 2. Vascular congestion with diffuse interstitial prominence, likely chronic. Electronically Signed   By: Kennith Center M.D.   On: 11/11/2022 07:56   EEG adult  Result Date: 11/10/2022 Charlsie Quest, MD  11/10/2022  2:43 PM  Patient Name: Kastyn Steltenpohl MRN: [id number] Epilepsy Attending: Charlsie Quest Referring Physician/Provider: Leroy Sea, MD Date: 11/10/2022 Duration: 26.03 mins Patient history: 87yo m with ams getting eeg to evaluate for seizure Level of alertness: Awake, asleep AEDs during EEG study: Ativan Technical aspects: This EEG study was done with scalp electrodes positioned according to the 10-20 International system of electrode placement. Electrical activity was reviewed with band pass filter of 1-70Hz , sensitivity of 7 uV/mm, display speed of 35mm/sec with a 60Hz  notched filter applied as appropriate. EEG data were recorded continuously and digitally stored.  Video monitoring was available and reviewed as appropriate. Description: No posterior dominant rhythm was seen. Sleep was characterized by  sleep spindles (12 to 14 Hz), maximal frontocentral region. EEG showed continuous generalized 3 to 6 Hz theta-delta slowing. Hyperventilation and photic stimulation were not performed.   ABNORMALITY - Continuous slow, generalized IMPRESSION: This study is suggestive of moderate diffuse encephalopathy. No seizures or epileptiform discharges were seen throughout the recording. Charlsie Quest   DG Chest Port 1 View  Result Date: 11/10/2022 CLINICAL DATA:  [id number] SOB (shortness of breath) [id number] EXAM: PORTABLE CHEST 1 VIEW COMPARISON:  CT chest 11/18/2022, chest x-ray 11/19/2022 FINDINGS: The heart and mediastinal contours are unchanged. Aortic calcification. Low lung volumes. Right base atelectasis with elevated right hemidiaphragm. Question airspace opacity of the right lung. Increased interstitial markings. No pleural effusion. No pneumothorax. No acute osseous abnormality. IMPRESSION: 1. Right base atelectasis with elevated right hemidiaphragm. Questioned airspace opacity of the right lung correlates with airspace opacity on CT chest 11/04/2022. 2.  Aortic Atherosclerosis (ICD10-I70.0). Electronically  Signed   By: Tish Frederickson M.D.   On: 11/10/2022 07:08   CT CHEST WO CONTRAST  Result Date: 11/17/2022 CLINICAL DATA:  Possible sepsis failure to thrive EXAM: CT CHEST WITHOUT CONTRAST TECHNIQUE: Multidetector CT imaging of the chest was performed following the standard protocol without IV contrast. RADIATION DOSE REDUCTION: This exam was performed according to the departmental dose-optimization program which includes automated exposure control, adjustment of the mA and/or kV according to patient size and/or use of iterative reconstruction technique. COMPARISON:  Chest x-ray 11/11/2022, 06/21/2022, CT lung bases 09/01/2017 FINDINGS: Cardiovascular: Limited without intravenous contrast. Advanced aortic atherosclerosis. No aneurysm. Extensive coronary vascular calcification. Mild cardiomegaly. No pericardial effusion Mediastinum/Nodes: Patent trachea. Slight tracheal deviation to the left from tortuous vessels. Mild air distension of the esophagus. Subcentimeter mediastinal lymph nodes. Esophagus within normal limits Lungs/Pleura: Chronic elevation of right diaphragm. Small right greater than left pleural effusions. Consolidation with air bronchograms in the right lower lobe slightly progressive compared to the lung bases on prior CT. Heterogeneous consolidation and ground-glass density in the right upper lobe. Upper Abdomen: No acute finding Musculoskeletal: No acute or suspicious osseous abnormality IMPRESSION: 1. Chronic elevation of right diaphragm. 2. Small right greater than left pleural effusions. 3. Consolidation with air bronchograms in the right lower lobe, slightly progressive compared to the lung bases on prior CT. Heterogeneous consolidation and ground-glass density in the right upper lobe, findings are suspicious for pneumonia. 4. Aortic atherosclerosis. Aortic Atherosclerosis (ICD10-I70.0). Electronically Signed   By: Jasmine Pang M.D.   On: 10/30/2022 19:36   CT Head Wo Contrast  Result  Date: 11/17/2022 CLINICAL DATA:  Mental status change, unknown cause; Polytrauma, blunt EXAM: CT HEAD WITHOUT CONTRAST CT CERVICAL SPINE WITHOUT CONTRAST TECHNIQUE: Multidetector CT imaging of the head and cervical spine was performed following the standard protocol without intravenous contrast. Multiplanar  CT image reconstructions of the cervical spine were also generated. RADIATION DOSE REDUCTION: This exam was performed according to the departmental dose-optimization program which includes automated exposure control, adjustment of the mA and/or kV according to patient size and/or use of iterative reconstruction technique. COMPARISON:  None Available. FINDINGS: CT HEAD FINDINGS Brain: No evidence of acute infarction, hemorrhage, hydrocephalus, extra-axial collection or mass lesion/mass effect. Sequela of moderate chronic microvascular ischemic change. Vascular: No hyperdense vessel or unexpected calcification. Skull: Normal. Negative for fracture or focal lesion. Sinuses/Orbits: No middle ear or mastoid effusion. Paranasal sinuses are clear. Bilateral lens replacement. Orbits are otherwise unremarkable. Other: None. CT CERVICAL SPINE FINDINGS Alignment: Grade 1 anterolisthesis of C3 on C4 and C4 on C5. Trace retrolisthesis of C5 on C6. Skull base and vertebrae: No acute fracture. No primary bone lesion or focal pathologic process. Soft tissues and spinal canal: No prevertebral fluid or swelling. No visible canal hematoma. Disc levels:  No evidence of high-grade spinal canal stenosis. Upper chest: Clustered nodularity in the right upper lobe is worrisome for an underlying infectious or inflammatory process. There is a small right-sided pleural effusion. Recommend further evaluation with a dedicated chest CT for more definitive characterization. Other: None IMPRESSION: 1. No acute intracranial abnormality. 2. No acute fracture or traumatic malalignment of the cervical spine. 3. Clustered nodularity in the right  upper lobe is worrisome for an underlying infectious or inflammatory process. There is a small right-sided pleural effusion. Recommend further evaluation with a dedicated chest CT for more definitive characterization. Electronically Signed   By: Lorenza Cambridge M.D.   On: 30-Nov-2022 15:10   CT Cervical Spine Wo Contrast  Result Date: 30-Nov-2022 CLINICAL DATA:  Mental status change, unknown cause; Polytrauma, blunt EXAM: CT HEAD WITHOUT CONTRAST CT CERVICAL SPINE WITHOUT CONTRAST TECHNIQUE: Multidetector CT imaging of the head and cervical spine was performed following the standard protocol without intravenous contrast. Multiplanar CT image reconstructions of the cervical spine were also generated. RADIATION DOSE REDUCTION: This exam was performed according to the departmental dose-optimization program which includes automated exposure control, adjustment of the mA and/or kV according to patient size and/or use of iterative reconstruction technique. COMPARISON:  None Available. FINDINGS: CT HEAD FINDINGS Brain: No evidence of acute infarction, hemorrhage, hydrocephalus, extra-axial collection or mass lesion/mass effect. Sequela of moderate chronic microvascular ischemic change. Vascular: No hyperdense vessel or unexpected calcification. Skull: Normal. Negative for fracture or focal lesion. Sinuses/Orbits: No middle ear or mastoid effusion. Paranasal sinuses are clear. Bilateral lens replacement. Orbits are otherwise unremarkable. Other: None. CT CERVICAL SPINE FINDINGS Alignment: Grade 1 anterolisthesis of C3 on C4 and C4 on C5. Trace retrolisthesis of C5 on C6. Skull base and vertebrae: No acute fracture. No primary bone lesion or focal pathologic process. Soft tissues and spinal canal: No prevertebral fluid or swelling. No visible canal hematoma. Disc levels:  No evidence of high-grade spinal canal stenosis. Upper chest: Clustered nodularity in the right upper lobe is worrisome for an underlying infectious or  inflammatory process. There is a small right-sided pleural effusion. Recommend further evaluation with a dedicated chest CT for more definitive characterization. Other: None IMPRESSION: 1. No acute intracranial abnormality. 2. No acute fracture or traumatic malalignment of the cervical spine. 3. Clustered nodularity in the right upper lobe is worrisome for an underlying infectious or inflammatory process. There is a small right-sided pleural effusion. Recommend further evaluation with a dedicated chest CT for more definitive characterization. Electronically Signed   By: Elige Radon.D.  On: 11/27/2022 15:10   DG Chest Port 1 View  Result Date: 11/05/2022 CLINICAL DATA:  Questionable sepsis - evaluate for abnormality EXAM: PORTABLE CHEST 1 VIEW COMPARISON:  CXR 06/19/20 FINDINGS: Asymmetric elevation of the right hemidiaphragm. No pleural effusion. There is a hazy opacity at the right lung base, which could represent atelectasis or infection. No radiographically apparent displaced rib fractures. Visualized upper abdomen is unremarkable. Aortic atherosclerotic calcifications. IMPRESSION: Hazy opacity at the right lung base could represent atelectasis or infection. Electronically Signed   By: Lorenza Cambridge M.D.   On: 11/18/2022 14:08    Microbiology: Recent Results (from the past 240 hour(s))  Resp panel by RT-PCR (RSV, Flu A&B, Covid) Anterior Nasal Swab     Status: None   Collection Time: 11/10/2022 12:44 PM   Specimen: Anterior Nasal Swab  Result Value Ref Range Status   SARS Coronavirus 2 by RT PCR NEGATIVE NEGATIVE Final   Influenza A by PCR NEGATIVE NEGATIVE Final   Influenza B by PCR NEGATIVE NEGATIVE Final    Comment: (NOTE) The Xpert Xpress SARS-CoV-2/FLU/RSV plus assay is intended as an aid in the diagnosis of influenza from Nasopharyngeal swab specimens and should not be used as a sole basis for treatment. Nasal washings and aspirates are unacceptable for Xpert Xpress  SARS-CoV-2/FLU/RSV testing.  Fact Sheet for Patients: BloggerCourse.com  Fact Sheet for Healthcare Providers: SeriousBroker.it  This test is not yet approved or cleared by the [place] FDA and has been authorized for detection and/or diagnosis of SARS-CoV-2 by FDA under an Emergency Use Authorization (EUA). This EUA will remain in effect (meaning this test can be used) for the duration of the COVID-19 declaration under Section 564(b)(1) of the Act, 21 U.S.C. section 360bbb-3(b)(1), unless the authorization is terminated or revoked.     Resp Syncytial Virus by PCR NEGATIVE NEGATIVE Final    Comment: (NOTE) Fact Sheet for Patients: BloggerCourse.com  Fact Sheet for Healthcare Providers: SeriousBroker.it  This test is not yet approved or cleared by the [place] FDA and has been authorized for detection and/or diagnosis of SARS-CoV-2 by FDA under an Emergency Use Authorization (EUA). This EUA will remain in effect (meaning this test can be used) for the duration of the COVID-19 declaration under Section 564(b)(1) of the Act, 21 U.S.C. section 360bbb-3(b)(1), unless the authorization is terminated or revoked.  Performed at [facility], [address], [place], [place] [address]   Blood Culture (routine x 2)     Status: None   Collection Time: 11/26/2022  1:00 PM   Specimen: BLOOD RIGHT FOREARM  Result Value Ref Range Status   Specimen Description BLOOD RIGHT FOREARM  Final   Special Requests   Final    BOTTLES DRAWN AEROBIC AND ANAEROBIC Blood Culture results may not be optimal due to an excessive volume of blood received in culture bottles   Culture   Final    NO GROWTH 5 DAYS Performed at [facility], [address] [place], [place] [address]    Report Status 11/14/2022 FINAL  Final  Blood Culture (routine x 2)     Status: None   Collection Time:  11/15/2022  1:17 PM   Specimen: BLOOD  Result Value Ref Range Status   Specimen Description BLOOD LEFT ANTECUBITAL  Final   Special Requests   Final    BOTTLES DRAWN AEROBIC AND ANAEROBIC Blood Culture results may not be optimal due to an excessive volume of blood received in culture bottles  Culture   Final    NO GROWTH 5 DAYS Performed at [facility], [address], [place], [place] [address]    Report Status 11/14/2022 FINAL  Final     Signed: Huey Bienenstock, MD

## 2022-11-29 NOTE — Procedures (Signed)
Cortrak  Person Inserting Tube:  Greig Castilla D, RD Tube Type:  Cortrak - 55 inches Tube Size:  10 Tube Location:  Left nare Secured by: Bridle Technique Used to Measure Tube Placement:  Marking at nare/corner of mouth Cortrak Secured At:  90 cm Procedure Comments:  Cortrak Tube Team Note:  Consult received to place a Cortrak feeding tube.   X-ray is required, abdominal x-ray has been ordered by the Cortrak team. Please confirm tube placement before using the Cortrak tube.   If the tube becomes dislodged please keep the tube and contact the Cortrak team at [email or web address] for replacement.  If after hours and replacement cannot be delayed, place a NG tube and confirm placement with an abdominal x-ray.    Greig Castilla, RD, LDN Clinical Dietitian RD pager # available in AMION  After hours/weekend pager # available in [facility]

## 2022-11-29 NOTE — Significant Event (Signed)
VBG has come back demonstrating hypercapnic resp failure with PCO2 of 82 up from 52 on 9/12.  ? If this cause of lethargy today.  Will do trial of either NIPPV (BIPAP) or HHFNC to try and treat hypercapnic failure.  Ordering 40mg  IV lasix as CXR still showing pulmonary edema.

## 2022-11-29 NOTE — Progress Notes (Signed)
Nutrition Brief Note  Chart reviewed.  Pt now transitioning to comfort care.  Cortrak placed this morning. Discussed with MD and PMT. RN to pull Cortrak tube.  No further nutrition interventions planned at this time.  Please re-consult as needed.   Drusilla Kanner, RDN, LDN Clinical Nutrition

## 2022-11-29 DEATH — deceased

## 2022-12-19 ENCOUNTER — Telehealth: Payer: Self-pay | Admitting: Cardiology

## 2022-12-19 NOTE — Telephone Encounter (Signed)
Very sorry to hear that. He was a long time patient of mine. I will give Ms [name] a call. Thank you for letting me know.  Thanks MJP

## 2022-12-19 NOTE — Telephone Encounter (Signed)
[name] called to inform us that patient is deceased. Informed her I am sorry for the loss and will let you know.

## 2022-12-19 NOTE — Telephone Encounter (Signed)
[name] called, she is calling to let Dr. Rosemary Holms that pt passed away on November 21, 2022

## 2022-12-20 ENCOUNTER — Ambulatory Visit: Payer: Medicare Other | Admitting: Neurology

## 2023-01-09 ENCOUNTER — Ambulatory Visit: Payer: Medicare Other | Admitting: Cardiology

## 2023-03-14 ENCOUNTER — Ambulatory Visit: Payer: Medicare Other | Admitting: Neurology

## 2023-03-23 ENCOUNTER — Ambulatory Visit: Payer: Medicare Other | Admitting: Cardiology
